# Patient Record
Sex: Female | Born: 1950
Health system: Southern US, Community
[De-identification: ages and names within clinical notes are randomized; demographics above are authoritative.]

## PROBLEM LIST (undated history)

## (undated) DIAGNOSIS — M810 Age-related osteoporosis without current pathological fracture: Secondary | ICD-10-CM

## (undated) DIAGNOSIS — R519 Headache, unspecified: Secondary | ICD-10-CM

## (undated) DIAGNOSIS — F32A Depression, unspecified: Secondary | ICD-10-CM

## (undated) DIAGNOSIS — F419 Anxiety disorder, unspecified: Secondary | ICD-10-CM

## (undated) DIAGNOSIS — F329 Major depressive disorder, single episode, unspecified: Secondary | ICD-10-CM

## (undated) DIAGNOSIS — E785 Hyperlipidemia, unspecified: Secondary | ICD-10-CM

## (undated) DIAGNOSIS — R51 Headache: Secondary | ICD-10-CM

## (undated) DIAGNOSIS — Z8674 Personal history of sudden cardiac arrest: Secondary | ICD-10-CM

## (undated) DIAGNOSIS — T7840XA Allergy, unspecified, initial encounter: Secondary | ICD-10-CM

## (undated) DIAGNOSIS — D649 Anemia, unspecified: Secondary | ICD-10-CM

## (undated) DIAGNOSIS — J189 Pneumonia, unspecified organism: Secondary | ICD-10-CM

## (undated) DIAGNOSIS — K219 Gastro-esophageal reflux disease without esophagitis: Secondary | ICD-10-CM

## (undated) DIAGNOSIS — E039 Hypothyroidism, unspecified: Secondary | ICD-10-CM

## (undated) DIAGNOSIS — M1990 Unspecified osteoarthritis, unspecified site: Secondary | ICD-10-CM

## (undated) DIAGNOSIS — E049 Nontoxic goiter, unspecified: Secondary | ICD-10-CM

## (undated) DIAGNOSIS — Z9289 Personal history of other medical treatment: Secondary | ICD-10-CM

## (undated) HISTORY — DX: Allergy, unspecified, initial encounter: T78.40XA

## (undated) HISTORY — DX: Depression, unspecified: F32.A

## (undated) HISTORY — DX: Age-related osteoporosis without current pathological fracture: M81.0

## (undated) HISTORY — DX: Hyperlipidemia, unspecified: E78.5

## (undated) HISTORY — DX: Major depressive disorder, single episode, unspecified: F32.9

## (undated) HISTORY — PX: DIAGNOSTIC LAPAROSCOPY: SUR761

## (undated) HISTORY — DX: Pneumonia, unspecified organism: J18.9

---

## 1974-09-28 HISTORY — PX: TONSILLECTOMY: SUR1361

## 1977-09-28 HISTORY — PX: ABDOMINAL HYSTERECTOMY: SHX81

## 2005-08-24 ENCOUNTER — Ambulatory Visit: Payer: Self-pay | Admitting: Family Medicine

## 2008-05-08 ENCOUNTER — Ambulatory Visit: Payer: Self-pay | Admitting: Gastroenterology

## 2013-10-11 ENCOUNTER — Emergency Department: Payer: Self-pay | Admitting: Emergency Medicine

## 2015-03-26 ENCOUNTER — Other Ambulatory Visit: Payer: Self-pay | Admitting: Family Medicine

## 2015-03-26 MED ORDER — ROSUVASTATIN CALCIUM 10 MG PO TABS: 10.0000 mg | ORAL_TABLET | Freq: Every day | ORAL | Status: DC

## 2015-05-08 ENCOUNTER — Ambulatory Visit (INDEPENDENT_AMBULATORY_CARE_PROVIDER_SITE_OTHER): Payer: 59 | Admitting: Family Medicine

## 2015-05-08 ENCOUNTER — Encounter: Payer: Self-pay | Admitting: Family Medicine

## 2015-05-08 VITALS — BP 138/80 | HR 72 | Temp 97.9°F | Ht 61.2 in | Wt 126.0 lb

## 2015-05-08 DIAGNOSIS — Z1239 Encounter for other screening for malignant neoplasm of breast: Secondary | ICD-10-CM | POA: Diagnosis not present

## 2015-05-08 DIAGNOSIS — Z0000 Encounter for general adult medical examination without abnormal findings: Secondary | ICD-10-CM | POA: Diagnosis not present

## 2015-05-08 DIAGNOSIS — F329 Major depressive disorder, single episode, unspecified: Secondary | ICD-10-CM

## 2015-05-08 DIAGNOSIS — F32A Depression, unspecified: Secondary | ICD-10-CM

## 2015-05-08 DIAGNOSIS — E785 Hyperlipidemia, unspecified: Secondary | ICD-10-CM | POA: Diagnosis not present

## 2015-05-08 DIAGNOSIS — E039 Hypothyroidism, unspecified: Secondary | ICD-10-CM | POA: Diagnosis not present

## 2015-05-08 LAB — URINALYSIS, ROUTINE W REFLEX MICROSCOPIC
Bilirubin, UA: NEGATIVE
Glucose, UA: NEGATIVE
KETONES UA: NEGATIVE
LEUKOCYTES UA: NEGATIVE
NITRITE UA: NEGATIVE
PH UA: 5.5 (ref 5.0–7.5)
Protein, UA: NEGATIVE
Specific Gravity, UA: 1.02 (ref 1.005–1.030)
UUROB: 0.2 mg/dL (ref 0.2–1.0)

## 2015-05-08 LAB — MICROSCOPIC EXAMINATION

## 2015-05-08 MED ORDER — LEVOTHYROXINE SODIUM 25 MCG PO TABS: 25.0000 ug | ORAL_TABLET | Freq: Every day | ORAL | Status: DC

## 2015-05-08 MED ORDER — ROSUVASTATIN CALCIUM 10 MG PO TABS: 10.0000 mg | ORAL_TABLET | Freq: Every day | ORAL | Status: DC

## 2015-05-08 MED ORDER — DIAZEPAM 5 MG PO TABS: 5.0000 mg | ORAL_TABLET | Freq: Two times a day (BID) | ORAL | Status: DC | PRN

## 2015-05-08 MED ORDER — BUPROPION HCL ER (SR) 150 MG PO TB12: 150.0000 mg | ORAL_TABLET | Freq: Two times a day (BID) | ORAL | Status: DC

## 2015-05-08 MED ORDER — CETIRIZINE HCL 10 MG PO TABS: 10.0000 mg | ORAL_TABLET | Freq: Every day | ORAL | Status: DC

## 2015-05-08 NOTE — Progress Notes (Signed)
BP 138/80 mmHg  Pulse 72  Temp(Src) 97.9 F (36.6 C)  Ht 5' 1.2" (1.554 m)  Wt 126 lb (57.153 kg)  BMI 23.67 kg/m2  SpO2 99%   Subjective:    Patient ID: Annette Miller, female    DOB: 1950/11/26, 64 y.o.   MRN: [id number]  HPI: Annette Miller is a 64 y.o. female  Chief Complaint  Patient presents with  . Annual Exam   patient follow-up for physical doing well on medications which were reviewed depression doing well as his anxiety takes Valium and is been on this same dose for 8 years. Does not increased medications are decreased. No issues with other medications no side effects.  Relevant past medical, surgical, family and social history reviewed and updated as indicated. Interim medical history since our last visit reviewed. Allergies and medications reviewed and updated.  Review of Systems  Constitutional: Negative.   HENT: Negative.   Eyes: Negative.   Respiratory: Negative.        Patient still smoking discussed smoking cessation patient not ready to start killing people. But she does say she is only smoking 4 cigarettes a day.  Cardiovascular: Negative.   Gastrointestinal: Negative.   Endocrine: Negative.   Genitourinary: Negative.   Musculoskeletal: Negative.   Skin: Negative.   Allergic/Immunologic: Negative.   Neurological: Negative.   Hematological: Negative.   Psychiatric/Behavioral: Negative.     Per HPI unless specifically indicated above     Objective:    BP 138/80 mmHg  Pulse 72  Temp(Src) 97.9 F (36.6 C)  Ht 5' 1.2" (1.554 m)  Wt 126 lb (57.153 kg)  BMI 23.67 kg/m2  SpO2 99%  Wt Readings from Last 3 Encounters:  05/08/15 126 lb (57.153 kg)  01/22/15 125 lb (56.7 kg)    Physical Exam  Constitutional: She is oriented to person, place, and time. She appears well-developed and well-nourished.  HENT:  Head: Normocephalic and atraumatic.  Right Ear: External ear normal.  Left Ear: External ear normal.  Nose: Nose normal.  Mouth/Throat:  Oropharynx is clear and moist.  Eyes: Conjunctivae and EOM are normal. Pupils are equal, round, and reactive to light.  Neck: Normal range of motion. Neck supple. Carotid bruit is not present.  Cardiovascular: Normal rate, regular rhythm and normal heart sounds.   No murmur heard. Pulmonary/Chest: Effort normal and breath sounds normal. Right breast exhibits no mass and no tenderness. Left breast exhibits no mass and no tenderness. Breasts are symmetrical.  Abdominal: Soft. Bowel sounds are normal. There is no hepatosplenomegaly.  Musculoskeletal: Normal range of motion.  Neurological: She is alert and oriented to person, place, and time.  Skin: No rash noted.  Psychiatric: She has a normal mood and affect. Her behavior is normal. Judgment and thought content normal.    No results found for this or any previous visit.    Assessment & Plan:   Problem List Items Addressed This Visit      Endocrine   Hypothyroidism    The current medical regimen is effective;  continue present plan and medications.       Relevant Medications   levothyroxine (SYNTHROID, LEVOTHROID) 25 MCG tablet     Other   Hyperlipidemia    The current medical regimen is effective;  continue present plan and medications.       Relevant Medications   rosuvastatin (CRESTOR) 10 MG tablet   Other Relevant Orders   LP+ALT+AST+Glu Piccolo, Waived   Depression    Discussed  with patient treatment and care discussed use of Valium and trying to taper some.      Relevant Medications   buPROPion (WELLBUTRIN SR) 150 MG 12 hr tablet   diazepam (VALIUM) 5 MG tablet    Other Visit Diagnoses    Routine general medical examination at a health care facility    -  Primary    Relevant Orders    CBC with Differential/Platelet    Comprehensive metabolic panel    Lipid Panel w/o Chol/HDL Ratio    TSH    Urinalysis, Routine w reflex microscopic (not at [facility])    Breast cancer screening        Relevant Orders    MM DIGITAL  SCREENING BILATERAL        Follow up plan: Return in about 6 months (around 11/08/2015) for med re check.

## 2015-05-08 NOTE — Assessment & Plan Note (Signed)
The current medical regimen is effective;  continue present plan and medications.  

## 2015-05-08 NOTE — Assessment & Plan Note (Signed)
Discussed with patient treatment and care discussed use of Valium and trying to taper some.

## 2015-05-09 ENCOUNTER — Encounter: Payer: Self-pay | Admitting: Family Medicine

## 2015-05-09 LAB — COMPREHENSIVE METABOLIC PANEL
ALBUMIN: 4.2 g/dL (ref 3.6–4.8)
ALK PHOS: 76 IU/L (ref 39–117)
ALT: 11 IU/L (ref 0–32)
AST: 12 IU/L (ref 0–40)
Albumin/Globulin Ratio: 1.8 (ref 1.1–2.5)
BUN / CREAT RATIO: 12 (ref 11–26)
BUN: 10 mg/dL (ref 8–27)
CHLORIDE: 103 mmol/L (ref 97–108)
CO2: 27 mmol/L (ref 18–29)
Calcium: 9.7 mg/dL (ref 8.7–10.3)
Creatinine, Ser: 0.83 mg/dL (ref 0.57–1.00)
GFR calc non Af Amer: 75 mL/min/{1.73_m2} (ref >59)
GFR, EST AFRICAN AMERICAN: 86 mL/min/{1.73_m2} (ref >59)
Globulin, Total: 2.3 g/dL (ref 1.5–4.5)
Glucose: 83 mg/dL (ref 65–99)
Potassium: 4.9 mmol/L (ref 3.5–5.2)
SODIUM: 142 mmol/L (ref 134–144)
Total Protein: 6.5 g/dL (ref 6.0–8.5)

## 2015-05-09 LAB — CBC WITH DIFFERENTIAL/PLATELET
Basophils Absolute: 0 10*3/uL (ref 0.0–0.2)
Basos: 1 %
EOS (ABSOLUTE): 0.4 10*3/uL (ref 0.0–0.4)
Eos: 7 %
Hematocrit: 36 % (ref 34.0–46.6)
Hemoglobin: 11.9 g/dL (ref 11.1–15.9)
Immature Grans (Abs): 0 10*3/uL (ref 0.0–0.1)
Immature Granulocytes: 0 %
Lymphocytes Absolute: 2.2 10*3/uL (ref 0.7–3.1)
Lymphs: 42 %
MCH: 31.8 pg (ref 26.6–33.0)
MCHC: 33.1 g/dL (ref 31.5–35.7)
MCV: 96 fL (ref 79–97)
MONOS ABS: 0.4 10*3/uL (ref 0.1–0.9)
Monocytes: 7 %
NEUTROS PCT: 43 %
Neutrophils Absolute: 2.3 10*3/uL (ref 1.4–7.0)
PLATELETS: 346 10*3/uL (ref 150–379)
RBC: 3.74 x10E6/uL — ABNORMAL LOW (ref 3.77–5.28)
RDW: 12.9 % (ref 12.3–15.4)
WBC: 5.2 10*3/uL (ref 3.4–10.8)

## 2015-05-09 LAB — LIPID PANEL W/O CHOL/HDL RATIO
CHOLESTEROL TOTAL: 179 mg/dL (ref 100–199)
HDL: 54 mg/dL (ref >39)
LDL Calculated: 92 mg/dL (ref 0–99)
Triglycerides: 166 mg/dL — ABNORMAL HIGH (ref 0–149)
VLDL Cholesterol Cal: 33 mg/dL (ref 5–40)

## 2015-05-09 LAB — TSH: TSH: 0.953 u[IU]/mL (ref 0.450–4.500)

## 2015-05-19 ENCOUNTER — Other Ambulatory Visit: Payer: Self-pay | Admitting: Family Medicine

## 2015-05-20 NOTE — Telephone Encounter (Signed)
rx printed

## 2015-06-04 ENCOUNTER — Other Ambulatory Visit: Payer: Self-pay | Admitting: Family Medicine

## 2015-06-17 ENCOUNTER — Other Ambulatory Visit: Payer: Self-pay | Admitting: Family Medicine

## 2015-11-12 ENCOUNTER — Encounter: Payer: Self-pay | Admitting: Family Medicine

## 2015-11-12 ENCOUNTER — Ambulatory Visit (INDEPENDENT_AMBULATORY_CARE_PROVIDER_SITE_OTHER): Payer: 59 | Admitting: Family Medicine

## 2015-11-12 DIAGNOSIS — E785 Hyperlipidemia, unspecified: Secondary | ICD-10-CM

## 2015-11-12 LAB — LP+ALT+AST+GLU PICCOLO, WAIVED
ALT (SGPT) PICCOLO, WAIVED: 18 U/L (ref 10–47)
AST (SGOT) Piccolo, Waived: 18 U/L (ref 11–38)
CHOL/HDL RATIO PICCOLO,WAIVE: 3.1 mg/dL
Cholesterol Piccolo, Waived: 206 mg/dL — ABNORMAL HIGH (ref <200)
Glucose Piccolo, Waived: 99 mg/dL (ref 73–118)
HDL Chol Piccolo, Waived: 67 mg/dL (ref >59)
LDL CHOL CALC PICCOLO WAIVED: 98 mg/dL (ref <100)
Triglycerides Piccolo,Waived: 205 mg/dL — ABNORMAL HIGH (ref <150)
VLDL Chol Calc Piccolo,Waive: 41 mg/dL — ABNORMAL HIGH (ref <30)

## 2015-11-12 MED ORDER — LEVOTHYROXINE SODIUM 25 MCG PO TABS: 25.0000 ug | ORAL_TABLET | Freq: Every day | ORAL | Status: DC

## 2015-11-12 MED ORDER — DIAZEPAM 5 MG PO TABS: 5.0000 mg | ORAL_TABLET | Freq: Two times a day (BID) | ORAL | Status: DC | PRN

## 2015-11-12 NOTE — Progress Notes (Signed)
BP 110/72 mmHg  Pulse 80  Temp(Src) 97.8 F (36.6 C)  Ht 5' 1.2" (1.554 m)  Wt 133 lb (60.328 kg)  BMI 24.98 kg/m2  SpO2 99%   Subjective:    Patient ID: Annette Miller, female    DOB: 1951-08-09, 65 y.o.   MRN: [id number]  HPI: Annette Miller is a 65 y.o. female  Chief Complaint  Patient presents with  . Hyperlipidemia   patient recheck doing well with Crestor no complaints or problems takes faithfully without side effects Smoking and anxieties down to 3 cigarettes a day and depression nerves are much better anxiety is better controlled with well maturing  Valium twice a day also with good control Thyroid doing well  Relevant past medical, surgical, family and social history reviewed and updated as indicated. Interim medical history since our last visit reviewed. Allergies and medications reviewed and updated. Patient taking both Allegra in the morning and Zyrtec at night has tried stopping 1 and got stopped up promptly and couldn't go without it  Review of Systems  Constitutional: Negative.   Respiratory: Negative.   Cardiovascular: Negative.     Per HPI unless specifically indicated above     Objective:    BP 110/72 mmHg  Pulse 80  Temp(Src) 97.8 F (36.6 C)  Ht 5' 1.2" (1.554 m)  Wt 133 lb (60.328 kg)  BMI 24.98 kg/m2  SpO2 99%  Wt Readings from Last 3 Encounters:  11/12/15 133 lb (60.328 kg)  05/08/15 126 lb (57.153 kg)  01/22/15 125 lb (56.7 kg)    Physical Exam  Constitutional: She is oriented to person, place, and time. She appears well-developed and well-nourished. No distress.  HENT:  Head: Normocephalic and atraumatic.  Right Ear: Hearing normal.  Left Ear: Hearing normal.  Nose: Nose normal.  Eyes: Conjunctivae and lids are normal. Right eye exhibits no discharge. Left eye exhibits no discharge. No scleral icterus.  Cardiovascular: Normal rate, regular rhythm and normal heart sounds.   Pulmonary/Chest: Effort normal and breath sounds  normal. No respiratory distress.  Musculoskeletal: Normal range of motion.  Neurological: She is alert and oriented to person, place, and time.  Skin: Skin is intact. No rash noted.  Psychiatric: She has a normal mood and affect. Her speech is normal and behavior is normal. Judgment and thought content normal. Cognition and memory are normal.    Results for orders placed or performed in visit on 05/08/15  Microscopic Examination  Result Value Ref Range   WBC, UA 0-5 0 -  5 /hpf   RBC, UA 0-2 0 -  2 /hpf   Epithelial Cells (non renal) 0-10 0 - 10 /hpf   Bacteria, UA Few None seen/Few  CBC with Differential/Platelet  Result Value Ref Range   WBC 5.2 3.4 - 10.8 x10E3/uL   RBC 3.74 (L) 3.77 - 5.28 x10E6/uL   Hemoglobin 11.9 11.1 - 15.9 g/dL   Hematocrit 40.9 81.1 - 46.6 %   MCV 96 79 - 97 fL   MCH 31.8 26.6 - 33.0 pg   MCHC 33.1 31.5 - 35.7 g/dL   RDW 91.4 78.2 - 95.6 %   Platelets 346 150 - 379 x10E3/uL   Neutrophils 43 %   Lymphs 42 %   Monocytes 7 %   Eos 7 %   Basos 1 %   Neutrophils Absolute 2.3 1.4 - 7.0 x10E3/uL   Lymphocytes Absolute 2.2 0.7 - 3.1 x10E3/uL   Monocytes Absolute 0.4 0.1 - 0.9 x10E3/uL  EOS (ABSOLUTE) 0.4 0.0 - 0.4 x10E3/uL   Basophils Absolute 0.0 0.0 - 0.2 x10E3/uL   Immature Granulocytes 0 %   Immature Grans (Abs) 0.0 0.0 - 0.1 x10E3/uL  Comprehensive metabolic panel  Result Value Ref Range   Glucose 83 65 - 99 mg/dL   BUN 10 8 - 27 mg/dL   Creatinine, Ser 1.61 0.57 - 1.00 mg/dL   GFR calc non Af Amer 75 >59 mL/min/1.73   GFR calc Af Amer 86 >59 mL/min/1.73   BUN/Creatinine Ratio 12 11 - 26   Sodium 142 134 - 144 mmol/L   Potassium 4.9 3.5 - 5.2 mmol/L   Chloride 103 97 - 108 mmol/L   CO2 27 18 - 29 mmol/L   Calcium 9.7 8.7 - 10.3 mg/dL   Total Protein 6.5 6.0 - 8.5 g/dL   Albumin 4.2 3.6 - 4.8 g/dL   Globulin, Total 2.3 1.5 - 4.5 g/dL   Albumin/Globulin Ratio 1.8 1.1 - 2.5   Bilirubin Total <0.2 0.0 - 1.2 mg/dL   Alkaline Phosphatase 76 39  - 117 IU/L   AST 12 0 - 40 IU/L   ALT 11 0 - 32 IU/L  Lipid Panel w/o Chol/HDL Ratio  Result Value Ref Range   Cholesterol, Total 179 100 - 199 mg/dL   Triglycerides 096 (H) 0 - 149 mg/dL   HDL 54 >04 mg/dL   VLDL Cholesterol Cal 33 5 - 40 mg/dL   LDL Calculated 92 0 - 99 mg/dL  TSH  Result Value Ref Range   TSH 0.953 0.450 - 4.500 uIU/mL  Urinalysis, Routine w reflex microscopic (not at [facility]  Result Value Ref Range   Specific Gravity, UA 1.020 1.005 - 1.030   pH, UA 5.5 5.0 - 7.5   Color, UA Yellow Yellow   Appearance Ur Clear Clear   Leukocytes, UA Negative Negative   Protein, UA Negative Negative/Trace   Glucose, UA Negative Negative   Ketones, UA Negative Negative   RBC, UA Trace (A) Negative   Bilirubin, UA Negative Negative   Urobilinogen, Ur 0.2 0.2 - 1.0 mg/dL   Nitrite, UA Negative Negative   Microscopic Examination See below:       Assessment & Plan:   Problem List Items Addressed This Visit      Other   Hyperlipidemia       Follow up plan: Return in about 6 months (around 05/11/2016) for Physical Exam.

## 2015-12-11 ENCOUNTER — Ambulatory Visit (INDEPENDENT_AMBULATORY_CARE_PROVIDER_SITE_OTHER): Payer: 59 | Admitting: Family Medicine

## 2015-12-11 ENCOUNTER — Encounter: Payer: Self-pay | Admitting: Family Medicine

## 2015-12-11 VITALS — BP 127/78 | HR 92 | Temp 97.8°F | Ht 61.1 in | Wt 132.4 lb

## 2015-12-11 DIAGNOSIS — J019 Acute sinusitis, unspecified: Secondary | ICD-10-CM | POA: Diagnosis not present

## 2015-12-11 MED ORDER — BENZONATATE 100 MG PO CAPS: 100.0000 mg | ORAL_CAPSULE | Freq: Two times a day (BID) | ORAL | Status: DC | PRN

## 2015-12-11 MED ORDER — AMOXICILLIN-POT CLAVULANATE 875-125 MG PO TABS: 1.0000 | ORAL_TABLET | Freq: Two times a day (BID) | ORAL | Status: DC

## 2015-12-11 NOTE — Progress Notes (Signed)
BP 127/78 mmHg  Pulse 92  Temp(Src) 97.8 F (36.6 C)  Ht 5' 1.1" (1.552 m)  Wt 132 lb 6.4 oz (60.056 kg)  BMI 24.93 kg/m2  SpO2 99%   Subjective:    Patient ID: Annette Miller, female    DOB: 05/04/1951, 65 y.o.   MRN: [id number]  HPI: Annette Miller is a 66 y.o. female  Chief Complaint  Patient presents with  . URI    pt states she has a headache, stuffy nose, sore thorat, ear ache, and chest congestion. States symtpoms started Sunday night.    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 Interim medical history since our last visit reviewed. Allergies and medications reviewed and updated.  Review of Systems  Constitutional: Positive for fever, chills, diaphoresis and fatigue.  HENT: Positive for congestion, postnasal drip, rhinorrhea, sinus pressure, sneezing and sore throat. Negative for ear pain.   Respiratory: Positive for cough and shortness of breath.   Cardiovascular: Negative.     Per HPI unless specifically indicated above     Objective:    BP 127/78 mmHg  Pulse 92  Temp(Src) 97.8 F (36.6 C)  Ht 5' 1.1" (1.552 m)  Wt 132 lb 6.4 oz (60.056 kg)  BMI 24.93 kg/m2  SpO2 99%  Wt Readings from Last 3 Encounters:  12/11/15 132 lb 6.4 oz (60.056 kg)  11/12/15 133 lb (60.328 kg)  05/08/15 126 lb (57.153 kg)    Physical Exam  Constitutional: She is oriented to person, place, and time. She appears well-developed and well-nourished. No distress.  HENT:  Head: Normocephalic and atraumatic.  Right Ear: Hearing and external ear normal.   Left Ear: Hearing and external ear normal.  Nose: Nose normal.  Mouth/Throat: Oropharyngeal exudate present.  Eyes: Conjunctivae and lids are normal. Right eye exhibits no discharge. Left eye exhibits no discharge. No scleral icterus.  Cardiovascular: Normal rate, regular rhythm and normal heart sounds.   Pulmonary/Chest: Effort normal and breath sounds normal. No respiratory distress.  Musculoskeletal: Normal range of motion.  Neurological: She is alert and oriented to person, place, and time.  Skin: Skin is intact. No rash noted.  Psychiatric: She has a normal mood and affect. Her speech is normal and behavior is normal. Judgment and thought content normal. Cognition and memory are normal.    Results for orders placed or performed in visit on 11/12/15  LP+ALT+AST+Glu Piccolo, [employer]  Result Value Ref Range   ALT (SGPT) Piccolo, Waived 18 10 - 47 U/L   AST (SGOT) Piccolo, Waived 18 11 - 38 U/L   Glucose Piccolo, Waived 99 73 - 118 mg/dL   Cholesterol Piccolo, Waived 206 (H) <200 mg/dL   HDL Chol Piccolo, Waived 67 >59 mg/dL   Triglycerides Piccolo,Waived 205 (H) <150 mg/dL   Chol/HDL Ratio Piccolo,Waive 3.1 mg/dL   LDL Chol Calc Piccolo Waived 98 <100 mg/dL   VLDL Chol Calc Piccolo,Waive 41 (H) <30 mg/dL      Assessment & Plan:   Problem List Items Addressed This Visit    None    Visit Diagnoses  Acute sinusitis, recurrence not specified, unspecified location    -  Primary    Discussed sinusitis care and treatment use of antibiotics use of over-the-counter medications Tylenol etc. infection control recheck if worse, nasal rinse    Relevant Medications    amoxicillin-clavulanate (AUGMENTIN) 875-125 MG tablet    benzonatate (TESSALON) 100 MG capsule        Follow up plan: Return if symptoms worsen or fail to improve, for As scheduled.

## 2015-12-11 NOTE — Addendum Note (Signed)
Addended byVonita Moss: Raenell Mensing on: 12/11/2015 11:08 AM   Modules accepted: Orders, SmartSet

## 2016-03-04 ENCOUNTER — Other Ambulatory Visit: Payer: Self-pay | Admitting: Family Medicine

## 2016-03-04 NOTE — Telephone Encounter (Signed)
fax

## 2016-05-07 ENCOUNTER — Other Ambulatory Visit: Payer: Self-pay | Admitting: Family Medicine

## 2016-05-12 ENCOUNTER — Encounter: Payer: Self-pay | Admitting: Family Medicine

## 2016-05-12 ENCOUNTER — Ambulatory Visit (INDEPENDENT_AMBULATORY_CARE_PROVIDER_SITE_OTHER): Payer: 59 | Admitting: Family Medicine

## 2016-05-12 VITALS — BP 127/78 | HR 82 | Temp 98.2°F | Ht 61.5 in | Wt 131.0 lb

## 2016-05-12 DIAGNOSIS — Z13820 Encounter for screening for osteoporosis: Secondary | ICD-10-CM

## 2016-05-12 DIAGNOSIS — E039 Hypothyroidism, unspecified: Secondary | ICD-10-CM

## 2016-05-12 DIAGNOSIS — Z90710 Acquired absence of both cervix and uterus: Secondary | ICD-10-CM

## 2016-05-12 DIAGNOSIS — Z23 Encounter for immunization: Secondary | ICD-10-CM | POA: Diagnosis not present

## 2016-05-12 DIAGNOSIS — F32A Depression, unspecified: Secondary | ICD-10-CM

## 2016-05-12 DIAGNOSIS — E785 Hyperlipidemia, unspecified: Secondary | ICD-10-CM

## 2016-05-12 DIAGNOSIS — Z0000 Encounter for general adult medical examination without abnormal findings: Secondary | ICD-10-CM | POA: Diagnosis not present

## 2016-05-12 DIAGNOSIS — F329 Major depressive disorder, single episode, unspecified: Secondary | ICD-10-CM | POA: Diagnosis not present

## 2016-05-12 DIAGNOSIS — Z1231 Encounter for screening mammogram for malignant neoplasm of breast: Secondary | ICD-10-CM

## 2016-05-12 LAB — URINALYSIS, ROUTINE W REFLEX MICROSCOPIC
Bilirubin, UA: NEGATIVE
GLUCOSE, UA: NEGATIVE
Ketones, UA: NEGATIVE
Leukocytes, UA: NEGATIVE
NITRITE UA: NEGATIVE
PH UA: 5 (ref 5.0–7.5)
Protein, UA: NEGATIVE
Specific Gravity, UA: 1.025 (ref 1.005–1.030)
Urobilinogen, Ur: 0.2 mg/dL (ref 0.2–1.0)

## 2016-05-12 LAB — MICROSCOPIC EXAMINATION

## 2016-05-12 MED ORDER — LEVOTHYROXINE SODIUM 25 MCG PO TABS: 25.0000 ug | ORAL_TABLET | Freq: Every day | ORAL | 4 refills | Status: DC

## 2016-05-12 MED ORDER — FEXOFENADINE HCL 180 MG PO TABS: 180.0000 mg | ORAL_TABLET | Freq: Every day | ORAL | 4 refills | Status: DC

## 2016-05-12 MED ORDER — ROSUVASTATIN CALCIUM 10 MG PO TABS: 10.0000 mg | ORAL_TABLET | Freq: Every day | ORAL | 4 refills | Status: DC

## 2016-05-12 MED ORDER — BUPROPION HCL ER (SR) 150 MG PO TB12: 150.0000 mg | ORAL_TABLET | Freq: Two times a day (BID) | ORAL | 4 refills | Status: DC

## 2016-05-12 MED ORDER — DIAZEPAM 5 MG PO TABS: 5.0000 mg | ORAL_TABLET | Freq: Two times a day (BID) | ORAL | 5 refills | Status: DC | PRN

## 2016-05-12 NOTE — Assessment & Plan Note (Signed)
The current medical regimen is effective;  continue present plan and medications.  

## 2016-05-12 NOTE — Patient Instructions (Signed)
Pneumococcal Conjugate Vaccine (PCV13)   1. Why get vaccinated?  Vaccination can protect both children and adults from pneumococcal disease.  Pneumococcal disease is caused by bacteria that can spread from person to person through close contact. It can cause ear infections, and it can also lead to more serious infections of the:  · Lungs (pneumonia),  · Blood (bacteremia), and  · Covering of the brain and spinal cord (meningitis).  Pneumococcal pneumonia is most common among adults. Pneumococcal meningitis can cause deafness and brain damage, and it kills about 1 child in 10 who get it.  Anyone can get pneumococcal disease, but children under 2 years of age and adults 65 years and older, people with certain medical conditions, and cigarette smokers are at the highest risk.  Before there was a vaccine, the [place] saw:  · more than 700 cases of meningitis,  · about 13,000 blood infections,  · about 5 million ear infections, and  · about 200 deaths  in children under 5 each year from pneumococcal disease. Since vaccine became available, severe pneumococcal disease in these children has fallen by 88%.  About 18,000 older adults die of pneumococcal disease each year in the [place].  Treatment of pneumococcal infections with penicillin and other drugs is not as effective as it used to be, because some strains of the disease have become resistant to these drugs. This makes prevention of the disease, through vaccination, even more important.  2. PCV13 vaccine  Pneumococcal conjugate vaccine (called PCV13) protects against 13 types of pneumococcal bacteria.  PCV13 is routinely given to children at 2, 4, 6, and 12-15 months of age. It is also recommended for children and adults 2 to 64 years of age with certain health conditions, and for all adults 65 years of age and older. Your doctor can give you details.  3. Some people should not get this vaccine  Anyone who has ever had a life-threatening allergic reaction  to a dose of this vaccine, to an earlier pneumococcal vaccine called PCV7, or to any vaccine containing diphtheria toxoid (for example, DTaP), should not get PCV13.  Anyone with a severe allergy to any component of PCV13 should not get the vaccine. Tell your doctor if the person being vaccinated has any severe allergies.  If the person scheduled for vaccination is not feeling well, your healthcare provider might decide to reschedule the shot on another day.  4. Risks of a vaccine reaction  With any medicine, including vaccines, there is a chance of reactions. These are usually mild and go away on their own, but serious reactions are also possible.  Problems reported following PCV13 varied by age and dose in the series. The most common problems reported among children were:  · About half became drowsy after the shot, had a temporary loss of appetite, or had redness or tenderness where the shot was given.  · About 1 out of 3 had swelling where the shot was given.  · About 1 out of 3 had a mild fever, and about 1 in 20 had a fever over 102.2°F.  · Up to about 8 out of 10 became fussy or irritable.  Adults have reported pain, redness, and swelling where the shot was given; also mild fever, fatigue, headache, chills, or muscle pain.  Young children who get PCV13 along with inactivated flu vaccine at the same time may be at increased risk for seizures caused by fever. Ask your doctor for more information.  Problems that   could happen after any vaccine:  · People sometimes faint after a medical procedure, including vaccination. Sitting or lying down for about 15 minutes can help prevent fainting, and injuries caused by a fall. Tell your doctor if you feel dizzy, or have vision changes or ringing in the ears.  · Some older children and adults get severe pain in the shoulder and have difficulty moving the arm where a shot was given. This happens very rarely.  · Any medication can cause a severe allergic reaction. Such  reactions from a vaccine are very rare, estimated at about 1 in a million doses, and would happen within a few minutes to a few hours after the vaccination.  As with any medicine, there is a very small chance of a vaccine causing a serious injury or death.  The safety of vaccines is always being monitored. For more information, visit: www.cdc.gov/vaccinesafety/  5. What if there is a serious reaction?  What should I look for?  · Look for anything that concerns you, such as signs of a severe allergic reaction, very high fever, or unusual behavior.  Signs of a severe allergic reaction can include hives, swelling of the face and throat, difficulty breathing, a fast heartbeat, dizziness, and weakness-usually within a few minutes to a few hours after the vaccination.  What should I do?  · If you think it is a severe allergic reaction or other emergency that can't wait, call 9-1-1 or get the person to the nearest hospital. Otherwise, call your doctor.  Reactions should be reported to the Vaccine Adverse Event Reporting System (VAERS). Your doctor should file this report, or you can do it yourself through the VAERS web site at www.vaers.hhs.gov, or by calling 1-800-822-7967.  VAERS does not give medical advice.  6. The National Vaccine Injury Compensation Program  The National Vaccine Injury Compensation Program (VICP) is a federal program that was created to compensate people who may have been injured by certain vaccines.  Persons who believe they may have been injured by a vaccine can learn about the program and about filing a claim by calling 1-800-338-2382 or visiting the VICP website at www.hrsa.gov/vaccinecompensation. There is a time limit to file a claim for compensation.  7. How can I learn more?  · Ask your healthcare provider. He or she can give you the vaccine package insert or suggest other sources of information.  · Call your local or state health department.  · Contact the Centers for Disease Control and  Prevention (CDC):    Call 1-800-232-4636 (1-800-CDC-INFO) or    Visit CDC's website at www.cdc.gov/vaccines  Vaccine Information Statement  PCV13 Vaccine (08/02/2014)     This information is not intended to replace advice given to you by your health care provider. Make sure you discuss any questions you have with your health care provider.     Document Released: 07/12/2006 Document Revised: 10/05/2014 Document Reviewed: 08/09/2014  [employer]vier Interactive Patient Education ©2016 [employer]

## 2016-05-12 NOTE — Progress Notes (Signed)
BP 127/78 (BP Location: Left Arm, Patient Position: Sitting, Cuff Size: Small)   Pulse 82   Temp 98.2 F (36.8 C)   Ht 5' 1.5" (1.562 m)   Wt 131 lb (59.4 kg)   SpO2 95%   BMI 24.35 kg/m    Subjective:    Patient ID: Annette Miller, female    DOB: 1950-11-29, 65 y.o.   MRN: [id number]  HPI: Annette Miller is a 65 y.o. female  Annual exam Patient very stressful with 2 deaths in her family this month has been taking Valium 5 mg twice a day most every day without problems increasing use and is been stable. His been able to work without problems Wellbutrin doing okay especially helping her not smoking especially during these high stress periods Taking thyroid without problems Taking Crestor without problems Has a Z-Pak for sinus symptoms but still just not helping has been on it for 4 days  Relevant past medical, surgical, family and social history reviewed and updated as indicated. Interim medical history since our last visit reviewed. Allergies and medications reviewed and updated.  Review of Systems  Constitutional: Negative.   HENT: Negative.   Eyes: Negative.   Respiratory: Negative.   Cardiovascular: Negative.   Gastrointestinal: Negative.   Endocrine: Negative.   Genitourinary: Negative.   Musculoskeletal: Negative.   Skin: Negative.   Allergic/Immunologic: Negative.   Neurological: Negative.   Hematological: Negative.   Psychiatric/Behavioral: Negative.     Per HPI unless specifically indicated above     Objective:    BP 127/78 (BP Location: Left Arm, Patient Position: Sitting, Cuff Size: Small)   Pulse 82   Temp 98.2 F (36.8 C)   Ht 5' 1.5" (1.562 m)   Wt 131 lb (59.4 kg)   SpO2 95%   BMI 24.35 kg/m   Wt Readings from Last 3 Encounters:  05/12/16 131 lb (59.4 kg)  12/11/15 132 lb 6.4 oz (60.1 kg)  11/12/15 133 lb (60.3 kg)    Physical Exam  Constitutional: She is oriented to person, place, and time. She appears well-developed and  well-nourished.  HENT:  Head: Normocephalic and atraumatic.  Right Ear: External ear normal.  Left Ear: External ear normal.  Nose: Nose normal.  Mouth/Throat: Oropharynx is clear and moist.  Eyes: Conjunctivae and EOM are normal. Pupils are equal, round, and reactive to light.  Neck: Normal range of motion. Neck supple. Carotid bruit is not present.  Cardiovascular: Normal rate, regular rhythm and normal heart sounds.   No murmur heard. Pulmonary/Chest: Effort normal and breath sounds normal. She exhibits no mass. Right breast exhibits no mass, no skin change and no tenderness. Left breast exhibits no mass, no skin change and no tenderness. Breasts are symmetrical.  Abdominal: Soft. Bowel sounds are normal. There is no hepatosplenomegaly.  Musculoskeletal: Normal range of motion.  Neurological: She is alert and oriented to person, place, and time.  Skin: No rash noted.  Psychiatric: She has a normal mood and affect. Her behavior is normal. Judgment and thought content normal.    Results for orders placed or performed in visit on 11/12/15  LP+ALT+AST+Glu Piccolo, Arrow ElectronicsWaived  Result Value Ref Range   ALT (SGPT) Piccolo, Waived 18 10 - 47 U/L   AST (SGOT) Piccolo, Waived 18 11 - 38 U/L   Glucose Piccolo, Waived 99 73 - 118 mg/dL   Cholesterol Piccolo, Waived 206 (H) <200 mg/dL   HDL Chol Piccolo, Waived 67 >59 mg/dL   Triglycerides Piccolo,Waived 205 (  H) <150 mg/dL   Chol/HDL Ratio Piccolo,Waive 3.1 mg/dL   LDL Chol Calc Piccolo Waived 98 <100 mg/dL   VLDL Chol Calc Piccolo,Waive 41 (H) <30 mg/dL      Assessment & Plan:   Problem List Items Addressed This Visit      Endocrine   Hypothyroidism    The current medical regimen is effective;  continue present plan and medications.       Relevant Medications   levothyroxine (SYNTHROID, LEVOTHROID) 25 MCG tablet     Other   Hyperlipidemia    The current medical regimen is effective;  continue present plan and medications.        Relevant Medications   rosuvastatin (CRESTOR) 10 MG tablet   Depression    Pression stable with ongoing anxiety will continue Valium      Relevant Medications   diazepam (VALIUM) 5 MG tablet   buPROPion (WELLBUTRIN SR) 150 MG 12 hr tablet   H/O total hysterectomy    Other Visit Diagnoses    Well adult exam    -  Primary   Relevant Orders   CBC with Differential/Platelet   Comprehensive metabolic panel   Lipid Panel w/o Chol/HDL Ratio   TSH   Urinalysis, Routine w reflex microscopic ([facility])   Encounter for screening for osteoporosis       Relevant Orders   DG Bone Density   Encounter for screening mammogram for breast cancer       Relevant Orders   MM Digital Screening   Need for pneumococcal vaccination       Relevant Orders   Pneumococcal conjugate vaccine 13-valent IM (Completed)       Follow up plan: Return in about 6 months (around 11/12/2016) for med check, BMP,  Lipids, ALT, AST.

## 2016-05-12 NOTE — Assessment & Plan Note (Signed)
Pression stable with ongoing anxiety will continue Valium

## 2016-05-13 LAB — COMPREHENSIVE METABOLIC PANEL
ALK PHOS: 102 IU/L (ref 39–117)
ALT: 10 IU/L (ref 0–32)
AST: 12 IU/L (ref 0–40)
Albumin/Globulin Ratio: 1.4 (ref 1.2–2.2)
Albumin: 4.2 g/dL (ref 3.6–4.8)
BUN/Creatinine Ratio: 20 (ref 12–28)
BUN: 16 mg/dL (ref 8–27)
Bilirubin Total: <0.2 mg/dL (ref 0.0–1.2)
CALCIUM: 10.5 mg/dL — AB (ref 8.7–10.3)
CO2: 25 mmol/L (ref 18–29)
CREATININE: 0.79 mg/dL (ref 0.57–1.00)
Chloride: 103 mmol/L (ref 96–106)
GFR calc Af Amer: 91 mL/min/{1.73_m2} (ref >59)
GFR, EST NON AFRICAN AMERICAN: 79 mL/min/{1.73_m2} (ref >59)
Globulin, Total: 2.9 g/dL (ref 1.5–4.5)
Glucose: 91 mg/dL (ref 65–99)
Potassium: 4.6 mmol/L (ref 3.5–5.2)
Sodium: 142 mmol/L (ref 134–144)
Total Protein: 7.1 g/dL (ref 6.0–8.5)

## 2016-05-13 LAB — LIPID PANEL W/O CHOL/HDL RATIO
CHOLESTEROL TOTAL: 285 mg/dL — AB (ref 100–199)
HDL: 51 mg/dL (ref >39)
LDL CALC: 197 mg/dL — AB (ref 0–99)
TRIGLYCERIDES: 184 mg/dL — AB (ref 0–149)
VLDL CHOLESTEROL CAL: 37 mg/dL (ref 5–40)

## 2016-05-13 LAB — CBC WITH DIFFERENTIAL/PLATELET
Basophils Absolute: 0 10*3/uL (ref 0.0–0.2)
Basos: 1 %
EOS (ABSOLUTE): 0.4 10*3/uL (ref 0.0–0.4)
Eos: 7 %
Hematocrit: 35.7 % (ref 34.0–46.6)
Hemoglobin: 11.9 g/dL (ref 11.1–15.9)
IMMATURE GRANULOCYTES: 0 %
Immature Grans (Abs): 0 10*3/uL (ref 0.0–0.1)
Lymphocytes Absolute: 2 10*3/uL (ref 0.7–3.1)
Lymphs: 38 %
MCH: 32.5 pg (ref 26.6–33.0)
MCHC: 33.3 g/dL (ref 31.5–35.7)
MCV: 98 fL — AB (ref 79–97)
MONOS ABS: 0.4 10*3/uL (ref 0.1–0.9)
Monocytes: 8 %
NEUTROS PCT: 46 %
Neutrophils Absolute: 2.5 10*3/uL (ref 1.4–7.0)
PLATELETS: 359 10*3/uL (ref 150–379)
RBC: 3.66 x10E6/uL — AB (ref 3.77–5.28)
RDW: 13 % (ref 12.3–15.4)
WBC: 5.4 10*3/uL (ref 3.4–10.8)

## 2016-05-13 LAB — TSH: TSH: 1.01 u[IU]/mL (ref 0.450–4.500)

## 2016-05-14 ENCOUNTER — Telehealth: Payer: Self-pay | Admitting: Family Medicine

## 2016-05-14 NOTE — Telephone Encounter (Signed)
Phone call Discussed with patient markedly elevated cholesterol patient's been eating a lot of junk food lately reviewed diet exercise nutrition patient will do better recheck lipids this winter.

## 2016-06-02 ENCOUNTER — Other Ambulatory Visit: Payer: Self-pay | Admitting: Family Medicine

## 2016-06-04 ENCOUNTER — Telehealth: Payer: Self-pay | Admitting: Family Medicine

## 2016-06-04 MED ORDER — AZITHROMYCIN 250 MG PO TABS: ORAL_TABLET | ORAL | 0 refills | Status: DC

## 2016-06-04 MED ORDER — BENZONATATE 100 MG PO CAPS: 100.0000 mg | ORAL_CAPSULE | Freq: Two times a day (BID) | ORAL | 0 refills | Status: DC | PRN

## 2016-06-04 NOTE — Telephone Encounter (Signed)
Call pt 

## 2016-06-04 NOTE — Telephone Encounter (Signed)
Pt called stated she completed the Z-pac Dr. Dossie Arbourrissman prescribed but now has an upper respiratory infection and would like to know if something can be called in for her as well as something for a cough. [facility] [place]. Thanks.

## 2016-06-04 NOTE — Telephone Encounter (Signed)
Phone call Patient got better with Z-Pak but got infected again wants another round with some Jerilynn Somessalon Perles will call again.

## 2016-06-19 ENCOUNTER — Other Ambulatory Visit: Payer: Self-pay | Admitting: Family Medicine

## 2016-11-01 ENCOUNTER — Other Ambulatory Visit: Payer: Self-pay | Admitting: Family Medicine

## 2016-11-01 DIAGNOSIS — F329 Major depressive disorder, single episode, unspecified: Secondary | ICD-10-CM

## 2016-11-01 DIAGNOSIS — F32A Depression, unspecified: Secondary | ICD-10-CM

## 2016-11-02 NOTE — Telephone Encounter (Signed)
  Last routine OV: 05/12/16 Next OV: 11/12/16

## 2016-11-05 ENCOUNTER — Other Ambulatory Visit: Payer: Self-pay | Admitting: Family Medicine

## 2016-11-05 DIAGNOSIS — F329 Major depressive disorder, single episode, unspecified: Secondary | ICD-10-CM

## 2016-11-05 DIAGNOSIS — F32A Depression, unspecified: Secondary | ICD-10-CM

## 2016-11-06 ENCOUNTER — Other Ambulatory Visit: Payer: Self-pay | Admitting: Family Medicine

## 2016-11-06 DIAGNOSIS — F32A Depression, unspecified: Secondary | ICD-10-CM

## 2016-11-06 DIAGNOSIS — F329 Major depressive disorder, single episode, unspecified: Secondary | ICD-10-CM

## 2016-11-12 ENCOUNTER — Encounter: Payer: Self-pay | Admitting: Family Medicine

## 2016-11-12 ENCOUNTER — Ambulatory Visit (INDEPENDENT_AMBULATORY_CARE_PROVIDER_SITE_OTHER): Payer: 59 | Admitting: Family Medicine

## 2016-11-12 VITALS — BP 130/82 | HR 97 | Temp 98.6°F | Ht 61.75 in | Wt 135.0 lb

## 2016-11-12 DIAGNOSIS — J019 Acute sinusitis, unspecified: Secondary | ICD-10-CM

## 2016-11-12 DIAGNOSIS — F32A Depression, unspecified: Secondary | ICD-10-CM

## 2016-11-12 DIAGNOSIS — Z79899 Other long term (current) drug therapy: Secondary | ICD-10-CM | POA: Diagnosis not present

## 2016-11-12 DIAGNOSIS — F329 Major depressive disorder, single episode, unspecified: Secondary | ICD-10-CM

## 2016-11-12 DIAGNOSIS — E785 Hyperlipidemia, unspecified: Secondary | ICD-10-CM

## 2016-11-12 LAB — LP+ALT+AST PICCOLO, WAIVED
ALT (SGPT) PICCOLO, WAIVED: 15 U/L (ref 10–47)
AST (SGOT) PICCOLO, WAIVED: 19 U/L (ref 11–38)
CHOL/HDL RATIO PICCOLO,WAIVE: 2.9 mg/dL
Cholesterol Piccolo, Waived: 173 mg/dL (ref <200)
HDL Chol Piccolo, Waived: 59 mg/dL (ref >59)
LDL Chol Calc Piccolo Waived: 72 mg/dL (ref <100)
Triglycerides Piccolo,Waived: 210 mg/dL — ABNORMAL HIGH (ref <150)
VLDL Chol Calc Piccolo,Waive: 42 mg/dL — ABNORMAL HIGH (ref <30)

## 2016-11-12 MED ORDER — DIAZEPAM 5 MG PO TABS: 5.0000 mg | ORAL_TABLET | Freq: Two times a day (BID) | ORAL | 5 refills | Status: DC | PRN

## 2016-11-12 MED ORDER — BENZONATATE 100 MG PO CAPS: 100.0000 mg | ORAL_CAPSULE | Freq: Two times a day (BID) | ORAL | 0 refills | Status: DC | PRN

## 2016-11-12 MED ORDER — AMOXICILLIN-POT CLAVULANATE 875-125 MG PO TABS: 1.0000 | ORAL_TABLET | Freq: Two times a day (BID) | ORAL | 0 refills | Status: DC

## 2016-11-12 NOTE — Progress Notes (Signed)
BP 130/82   Pulse 97   Temp 98.6 F (37 C) (Oral)   Ht 5' 1.75" (1.568 m)   Wt 135 lb (61.2 kg)   SpO2 99%   BMI 24.89 kg/m    Subjective:    Patient ID: Annette Miller, female    DOB: 1951-01-26, 66 y.o.   MRN: [id number]  HPI: Annette Miller is a 66 y.o. female  Chief Complaint  Patient presents with  . Follow-up   Patient follow-up cholesterol doing well taking medications without problems. Patient also is being treated for the flu empirically on last day of Tamiflu originally was getting a little better and not worse with marked sinus pressure congestion drainage feeling bad. Has been taking some over-the-counter medications with minimal effect Tessalon Perles didn't help. Patient also with chronic anxiety is been taking Valium 5 mg twice a day long-term stride intermittent tapering with no relief. Relevant past medical, surgical, family and social history reviewed and updated as indicated. Interim medical history since our last visit reviewed. Allergies and medications reviewed and updated.  Review of Systems  Constitutional: Positive for diaphoresis and fatigue. Negative for fever.  HENT: Positive for congestion, postnasal drip, rhinorrhea, sinus pain and sinus pressure.   Respiratory: Positive for cough.   Cardiovascular: Negative.     Per HPI unless specifically indicated above     Objective:    BP 130/82   Pulse 97   Temp 98.6 F (37 C) (Oral)   Ht 5' 1.75" (1.568 m)   Wt 135 lb (61.2 kg)   SpO2 99%   BMI 24.89 kg/m   Wt Readings from Last 3 Encounters:  11/12/16 135 lb (61.2 kg)  05/12/16 131 lb (59.4 kg)  12/11/15 132 lb 6.4 oz (60.1 kg)    Physical Exam  Constitutional: She is oriented to person, place, and time. She appears well-developed and well-nourished. No distress.  HENT:  Head: Normocephalic and atraumatic.  Right Ear: Hearing and external ear normal.  Left Ear: Hearing and external ear normal.  Nose: Nose normal.  Mouth/Throat:  Oropharyngeal exudate present.  Eyes: Conjunctivae and lids are normal. Right eye exhibits no discharge. Left eye exhibits no discharge. No scleral icterus.  Cardiovascular: Normal rate, regular rhythm and normal heart sounds.   Pulmonary/Chest: Effort normal and breath sounds normal. No respiratory distress.  Musculoskeletal: Normal range of motion.  Neurological: She is alert and oriented to person, place, and time.  Skin: Skin is intact. No rash noted.  Psychiatric: She has a normal mood and affect. Her speech is normal and behavior is normal. Judgment and thought content normal. Cognition and memory are normal.    Results for orders placed or performed in visit on 05/12/16  Microscopic Examination  Result Value Ref Range   WBC, UA 0-5 0 - 5 /hpf   RBC, UA 0-2 0 - 2 /hpf   Epithelial Cells (non renal) 0-10 0 - 10 /hpf   Mucus, UA Present Not Estab.   Bacteria, UA Few None seen/Few  CBC with Differential/Platelet  Result Value Ref Range   WBC 5.4 3.4 - 10.8 x10E3/uL   RBC 3.66 (L) 3.77 - 5.28 x10E6/uL   Hemoglobin 11.9 11.1 - 15.9 g/dL   Hematocrit 40.9 81.1 - 46.6 %   MCV 98 (H) 79 - 97 fL   MCH 32.5 26.6 - 33.0 pg   MCHC 33.3 31.5 - 35.7 g/dL   RDW 91.4 78.2 - 95.6 %   Platelets 359 150 -  379 x10E3/uL   Neutrophils 46 %   Lymphs 38 %   Monocytes 8 %   Eos 7 %   Basos 1 %   Neutrophils Absolute 2.5 1.4 - 7.0 x10E3/uL   Lymphocytes Absolute 2.0 0.7 - 3.1 x10E3/uL   Monocytes Absolute 0.4 0.1 - 0.9 x10E3/uL   EOS (ABSOLUTE) 0.4 0.0 - 0.4 x10E3/uL   Basophils Absolute 0.0 0.0 - 0.2 x10E3/uL   Immature Granulocytes 0 %   Immature Grans (Abs) 0.0 0.0 - 0.1 x10E3/uL  Comprehensive metabolic panel  Result Value Ref Range   Glucose 91 65 - 99 mg/dL   BUN 16 8 - 27 mg/dL   Creatinine, Ser 1.61 0.57 - 1.00 mg/dL   GFR calc non Af Amer 79 >59 mL/min/1.73   GFR calc Af Amer 91 >59 mL/min/1.73   BUN/Creatinine Ratio 20 12 - 28   Sodium 142 134 - 144 mmol/L   Potassium 4.6 3.5  - 5.2 mmol/L   Chloride 103 96 - 106 mmol/L   CO2 25 18 - 29 mmol/L   Calcium 10.5 (H) 8.7 - 10.3 mg/dL   Total Protein 7.1 6.0 - 8.5 g/dL   Albumin 4.2 3.6 - 4.8 g/dL   Globulin, Total 2.9 1.5 - 4.5 g/dL   Albumin/Globulin Ratio 1.4 1.2 - 2.2   Bilirubin Total <0.2 0.0 - 1.2 mg/dL   Alkaline Phosphatase 102 39 - 117 IU/L   AST 12 0 - 40 IU/L   ALT 10 0 - 32 IU/L  Lipid Panel w/o Chol/HDL Ratio  Result Value Ref Range   Cholesterol, Total 285 (H) 100 - 199 mg/dL   Triglycerides 096 (H) 0 - 149 mg/dL   HDL 51 >04 mg/dL   VLDL Cholesterol Cal 37 5 - 40 mg/dL   LDL Calculated 540 (H) 0 - 99 mg/dL   Comment: Comment   TSH  Result Value Ref Range   TSH 1.010 0.450 - 4.500 uIU/mL  Urinalysis, Routine w reflex microscopic (not at [facility])  Result Value Ref Range   Specific Gravity, UA 1.025 1.005 - 1.030   pH, UA 5.0 5.0 - 7.5   Color, UA Yellow Yellow   Appearance Ur Clear Clear   Leukocytes, UA Negative Negative   Protein, UA Negative Negative/Trace   Glucose, UA Negative Negative   Ketones, UA Negative Negative   RBC, UA Trace (A) Negative   Bilirubin, UA Negative Negative   Urobilinogen, Ur 0.2 0.2 - 1.0 mg/dL   Nitrite, UA Negative Negative   Microscopic Examination See below:       Assessment & Plan:   Problem List Items Addressed This Visit      Other   Hyperlipidemia - Primary    The current medical regimen is effective;  continue present plan and medications.       Relevant Orders   Basic metabolic panel   LP+ALT+AST Piccolo, Waived   Depression    The current medical regimen is effective;  continue present plan and medications.       Relevant Medications   diazepam (VALIUM) 5 MG tablet    Other Visit Diagnoses    Medication management       Relevant Orders   Basic metabolic panel   LP+ALT+AST Piccolo, Waived   Acute non-recurrent sinusitis, unspecified location       Relevant Medications   benzonatate (TESSALON) 100 MG capsule    amoxicillin-clavulanate (AUGMENTIN) 875-125 MG tablet       Follow up plan: Return in  about 6 months (around 05/12/2017) for Physical Exam.

## 2016-11-12 NOTE — Assessment & Plan Note (Signed)
The current medical regimen is effective;  continue present plan and medications.  

## 2016-11-13 LAB — BASIC METABOLIC PANEL
BUN/Creatinine Ratio: 19 (ref 12–28)
BUN: 15 mg/dL (ref 8–27)
CHLORIDE: 104 mmol/L (ref 96–106)
CO2: 24 mmol/L (ref 18–29)
Calcium: 9.2 mg/dL (ref 8.7–10.3)
Creatinine, Ser: 0.81 mg/dL (ref 0.57–1.00)
GFR, EST AFRICAN AMERICAN: 88 mL/min/{1.73_m2} (ref >59)
GFR, EST NON AFRICAN AMERICAN: 76 mL/min/{1.73_m2} (ref >59)
Glucose: 98 mg/dL (ref 65–99)
POTASSIUM: 4.6 mmol/L (ref 3.5–5.2)
SODIUM: 144 mmol/L (ref 134–144)

## 2016-11-16 ENCOUNTER — Encounter: Payer: Self-pay | Admitting: Family Medicine

## 2016-12-28 ENCOUNTER — Other Ambulatory Visit: Payer: Self-pay | Admitting: Family Medicine

## 2016-12-28 MED ORDER — BENZONATATE 100 MG PO CAPS: 100.0000 mg | ORAL_CAPSULE | Freq: Two times a day (BID) | ORAL | 0 refills | Status: DC | PRN

## 2016-12-28 MED ORDER — AMOXICILLIN-POT CLAVULANATE 875-125 MG PO TABS: 1.0000 | ORAL_TABLET | Freq: Two times a day (BID) | ORAL | 0 refills | Status: DC

## 2016-12-28 NOTE — Telephone Encounter (Signed)
Patient called to see if Dr Dossie Arbour had called in refills on her antibiotic and cough med  [facility]

## 2016-12-28 NOTE — Telephone Encounter (Signed)
Routing to provider. Last visit 11/12/16

## 2017-01-27 ENCOUNTER — Encounter: Payer: Self-pay | Admitting: Family Medicine

## 2017-01-27 ENCOUNTER — Ambulatory Visit (INDEPENDENT_AMBULATORY_CARE_PROVIDER_SITE_OTHER): Payer: 59 | Admitting: Family Medicine

## 2017-01-27 DIAGNOSIS — M62838 Other muscle spasm: Secondary | ICD-10-CM | POA: Diagnosis not present

## 2017-01-27 DIAGNOSIS — F3342 Major depressive disorder, recurrent, in full remission: Secondary | ICD-10-CM | POA: Diagnosis not present

## 2017-01-27 MED ORDER — MELOXICAM 15 MG PO TABS: 15.0000 mg | ORAL_TABLET | Freq: Every day | ORAL | 3 refills | Status: DC

## 2017-01-27 MED ORDER — CYCLOBENZAPRINE HCL 5 MG PO TABS: 5.0000 mg | ORAL_TABLET | Freq: Three times a day (TID) | ORAL | 1 refills | Status: DC | PRN

## 2017-01-27 NOTE — Assessment & Plan Note (Signed)
Marked right trapezius muscle spasming discuss stretching range of motion strengthening massage therapy use of cyclobenzaprine and cautions about drowsiness meloxicam.

## 2017-01-27 NOTE — Progress Notes (Signed)
BP 129/76   Pulse 81   SpO2 99%    Subjective:    Patient ID: Annette Miller, female    DOB: 11/04/1950, 66 y.o.   MRN: [id number]  HPI: Annette Miller is a 66 y.o. female  Chief Complaint  Patient presents with  . 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 Hurts throughout neck and shoulder area with marked tension and spasming in this area and tenderness. No rash is really started yesterday but got much worse today. Taking Crestor with no myalgias takes Valium at bedtime and does okay with sleep.  Relevant past medical, surgical, family and social history reviewed and updated as indicated. Interim medical history since our last visit reviewed. Allergies and medications reviewed and updated.  Review of Systems  Constitutional: Negative.   Respiratory: Negative.   Cardiovascular: Negative.     Per HPI unless specifically indicated above     Objective:    BP 129/76   Pulse 81   SpO2 99%   Wt Readings from Last 3 Encounters:  11/12/16 135 lb (61.2 kg)  05/12/16 131 lb (59.4 kg)  12/11/15 132 lb 6.4 oz (60.1 kg)    Physical Exam  Constitutional: She is oriented to person, place, and time. She appears well-developed and well-nourished.  HENT:  Head: Normocephalic and atraumatic.  Eyes: Conjunctivae and EOM are normal.  Neck: Normal range of motion.  Cardiovascular: Normal rate, regular rhythm and normal heart sounds.   Pulmonary/Chest: Effort normal and breath sounds normal.  Musculoskeletal: Normal range of motion.  Marked tenderness in right trapezius area no rash  Neurological: She is alert and oriented to person, place, and time.  Skin: No erythema.  Psychiatric: She has a normal mood and affect. Her behavior is normal. Judgment and thought content normal.    Results for orders placed or performed in visit on 11/12/16  Basic metabolic panel  Result Value Ref  Range   Glucose 98 65 - 99 mg/dL   BUN 15 8 - 27 mg/dL   Creatinine, Ser 4.09 0.57 - 1.00 mg/dL   GFR calc non Af Amer 76 >59 mL/min/1.73   GFR calc Af Amer 88 >59 mL/min/1.73   BUN/Creatinine Ratio 19 12 - 28   Sodium 144 134 - 144 mmol/L   Potassium 4.6 3.5 - 5.2 mmol/L   Chloride 104 96 - 106 mmol/L   CO2 24 18 - 29 mmol/L   Calcium 9.2 8.7 - 10.3 mg/dL  LP+ALT+AST Piccolo, Waived  Result Value Ref Range   ALT (SGPT) Piccolo, Waived 15 10 - 47 U/L   AST (SGOT) Piccolo, Waived 19 11 - 38 U/L   Cholesterol Piccolo, Waived 173 <200 mg/dL   HDL Chol Piccolo, Waived 59 >59 mg/dL   Triglycerides Piccolo,Waived 210 (H) <150 mg/dL   Chol/HDL Ratio Piccolo,Waive 2.9 mg/dL   LDL Chol Calc Piccolo Waived 72 <100 mg/dL   VLDL Chol Calc Piccolo,Waive 42 (H) <30 mg/dL      Assessment & Plan:   Problem List Items Addressed This Visit      Musculoskeletal and Integument   Trapezius muscle spasm    Marked right trapezius muscle spasming discuss stretching range of motion strengthening massage therapy use of cyclobenzaprine and cautions about drowsiness meloxicam.        Other   Depression    Continue with the Valium she is  taking as that may help with some muscle spasming takes it primarily at nighttime depression otherwise stable          Follow up plan: Return for As scheduled.

## 2017-01-27 NOTE — Assessment & Plan Note (Signed)
Continue with the Valium she is taking as that may help with some muscle spasming takes it primarily at nighttime depression otherwise stable

## 2017-02-01 ENCOUNTER — Other Ambulatory Visit: Payer: Self-pay | Admitting: Family Medicine

## 2017-02-01 NOTE — Telephone Encounter (Signed)
Last OV: 01/27/17 Next OV: 05/20/17  Lab Results  Component Value Date   TSH 1.010 05/12/2016

## 2017-03-17 ENCOUNTER — Telehealth: Payer: Self-pay | Admitting: Family Medicine

## 2017-03-17 NOTE — Telephone Encounter (Signed)
Patient is coughing and having sinus issues. Patient is out of her z-pac medication and would like to see about getting a prescription for Augmentin. Patient stated she has been prescribed Augmentin before and feels that will help her better. Patient aware PCP is out of office. Patient would like to receive a call once medication is ready.   Please advise.  Thank you

## 2017-03-17 NOTE — Telephone Encounter (Signed)
Called and let pt know that she would need to be seen for medication. Pt verbalized understanding and will call back to schedule if needed.

## 2017-05-12 ENCOUNTER — Other Ambulatory Visit: Payer: Self-pay | Admitting: Family Medicine

## 2017-05-12 DIAGNOSIS — F329 Major depressive disorder, single episode, unspecified: Secondary | ICD-10-CM

## 2017-05-12 DIAGNOSIS — F32A Depression, unspecified: Secondary | ICD-10-CM

## 2017-05-16 ENCOUNTER — Other Ambulatory Visit: Payer: Self-pay | Admitting: Family Medicine

## 2017-05-16 DIAGNOSIS — F329 Major depressive disorder, single episode, unspecified: Secondary | ICD-10-CM

## 2017-05-16 DIAGNOSIS — F32A Depression, unspecified: Secondary | ICD-10-CM

## 2017-05-17 ENCOUNTER — Telehealth: Payer: Self-pay | Admitting: Family Medicine

## 2017-05-17 NOTE — Telephone Encounter (Signed)
Pt called and stated that her diazepam isn't at the pharmacy and she would like to know if it could be done a second time.

## 2017-05-18 NOTE — Telephone Encounter (Signed)
[facility] to  [id number] [place], [place] - [address] [phone] (Phone) [phone] (Fax)

## 2017-05-20 ENCOUNTER — Ambulatory Visit (INDEPENDENT_AMBULATORY_CARE_PROVIDER_SITE_OTHER): Payer: 59 | Admitting: Family Medicine

## 2017-05-20 ENCOUNTER — Encounter: Payer: Self-pay | Admitting: Family Medicine

## 2017-05-20 VITALS — BP 106/69 | HR 105 | Ht 62.21 in | Wt 130.0 lb

## 2017-05-20 DIAGNOSIS — Z1159 Encounter for screening for other viral diseases: Secondary | ICD-10-CM | POA: Diagnosis not present

## 2017-05-20 DIAGNOSIS — Z1239 Encounter for other screening for malignant neoplasm of breast: Secondary | ICD-10-CM

## 2017-05-20 DIAGNOSIS — T7840XA Allergy, unspecified, initial encounter: Secondary | ICD-10-CM | POA: Insufficient documentation

## 2017-05-20 DIAGNOSIS — Z131 Encounter for screening for diabetes mellitus: Secondary | ICD-10-CM

## 2017-05-20 DIAGNOSIS — F419 Anxiety disorder, unspecified: Secondary | ICD-10-CM | POA: Diagnosis not present

## 2017-05-20 DIAGNOSIS — E7800 Pure hypercholesterolemia, unspecified: Secondary | ICD-10-CM

## 2017-05-20 DIAGNOSIS — T7840XD Allergy, unspecified, subsequent encounter: Secondary | ICD-10-CM

## 2017-05-20 DIAGNOSIS — E785 Hyperlipidemia, unspecified: Secondary | ICD-10-CM | POA: Diagnosis not present

## 2017-05-20 DIAGNOSIS — E039 Hypothyroidism, unspecified: Secondary | ICD-10-CM

## 2017-05-20 DIAGNOSIS — F325 Major depressive disorder, single episode, in full remission: Secondary | ICD-10-CM

## 2017-05-20 DIAGNOSIS — Z780 Asymptomatic menopausal state: Secondary | ICD-10-CM

## 2017-05-20 DIAGNOSIS — Z0000 Encounter for general adult medical examination without abnormal findings: Secondary | ICD-10-CM

## 2017-05-20 DIAGNOSIS — F3342 Major depressive disorder, recurrent, in full remission: Secondary | ICD-10-CM | POA: Diagnosis not present

## 2017-05-20 LAB — URINALYSIS, ROUTINE W REFLEX MICROSCOPIC
BILIRUBIN UA: NEGATIVE
Glucose, UA: NEGATIVE
Leukocytes, UA: NEGATIVE
Nitrite, UA: NEGATIVE
PH UA: 6.5 (ref 5.0–7.5)
Specific Gravity, UA: 1.025 (ref 1.005–1.030)
Urobilinogen, Ur: 0.2 mg/dL (ref 0.2–1.0)

## 2017-05-20 LAB — MICROSCOPIC EXAMINATION: Bacteria, UA: NONE SEEN

## 2017-05-20 MED ORDER — LORATADINE 10 MG PO TABS: 10.0000 mg | ORAL_TABLET | Freq: Every day | ORAL | 11 refills | Status: DC

## 2017-05-20 MED ORDER — LEVOTHYROXINE SODIUM 25 MCG PO TABS: 25.0000 ug | ORAL_TABLET | Freq: Every day | ORAL | 4 refills | Status: DC

## 2017-05-20 MED ORDER — FLUTICASONE PROPIONATE 50 MCG/ACT NA SUSP: 2.0000 | Freq: Every day | NASAL | 12 refills | Status: DC

## 2017-05-20 MED ORDER — ROSUVASTATIN CALCIUM 10 MG PO TABS: 10.0000 mg | ORAL_TABLET | Freq: Every day | ORAL | 4 refills | Status: DC

## 2017-05-20 MED ORDER — BUPROPION HCL ER (SR) 150 MG PO TB12: 150.0000 mg | ORAL_TABLET | Freq: Two times a day (BID) | ORAL | 4 refills | Status: DC

## 2017-05-20 MED ORDER — AZELASTINE HCL 0.1 % NA SOLN: 2.0000 | Freq: Two times a day (BID) | NASAL | 12 refills | Status: DC

## 2017-05-20 MED ORDER — MELOXICAM 15 MG PO TABS: 15.0000 mg | ORAL_TABLET | Freq: Every day | ORAL | 3 refills | Status: DC

## 2017-05-20 NOTE — Progress Notes (Signed)
BP 106/69   Pulse (!) 105   Ht 5' 2.21" (1.58 m)   Wt 130 lb (59 kg)   SpO2 98%   BMI 23.62 kg/m    Subjective:    Patient ID: Annette Miller, female    DOB: [birth year], 66 y.o.   MRN: [id number]  HPI: Annette Miller is a 66 y.o. female  Chief Complaint  Patient presents with  . Annual Exam  . Medication Problem    Allergy meds no longer working.   Patient follow-up physical allergies and medicines seems to be coping about not working anymore now in heart of allergy season bothered a lot uses Allegra and Zyrtec alternating morning and evening. Not using any nose sprays etc.  Anxiety patient taking Valium 5 mg morning and evening for anger and control issues especially with very frustrating and difficult job. Patient's been taking faithfully long-term without escalation of dose will continue medications.  Takes thyroid without problems. Takes bupropion without problems Also takes Crestor without problems.  Relevant past medical, surgical, family and social history reviewed and updated as indicated. Interim medical history since our last visit reviewed. Allergies and medications reviewed and updated.  Review of Systems  Constitutional: Negative.   HENT: Negative.   Eyes: Negative.   Respiratory: Negative.   Cardiovascular: Negative.   Gastrointestinal: Negative.   Endocrine: Negative.   Genitourinary: Negative.   Musculoskeletal: Negative.   Skin: Negative.   Allergic/Immunologic: Negative.   Neurological: Negative.   Hematological: Negative.   Psychiatric/Behavioral: Negative.     Per HPI unless specifically indicated above     Objective:    BP 106/69   Pulse (!) 105   Ht 5' 2.21" (1.58 m)   Wt 130 lb (59 kg)   SpO2 98%   BMI 23.62 kg/m   Wt Readings from Last 3 Encounters:  05/20/17 130 lb (59 kg)  11/12/16 135 lb (61.2 kg)  05/12/16 131 lb (59.4 kg)    Physical Exam  Constitutional: She is oriented to person, place, and time. She appears  well-developed and well-nourished.  HENT:  Head: Normocephalic and atraumatic.  Right Ear: External ear normal.  Left Ear: External ear normal.  Nose: Nose normal.  Mouth/Throat: Oropharynx is clear and moist.  Eyes: Pupils are equal, round, and reactive to light. Conjunctivae and EOM are normal.  Neck: Normal range of motion. Neck supple. Carotid bruit is not present.  Cardiovascular: Normal rate, regular rhythm and normal heart sounds.   No murmur heard. Pulmonary/Chest: Effort normal and breath sounds normal. She exhibits no mass. Right breast exhibits no mass, no skin change and no tenderness. Left breast exhibits no mass, no skin change and no tenderness. Breasts are symmetrical.  Abdominal: Soft. Bowel sounds are normal. There is no hepatosplenomegaly.  Musculoskeletal: Normal range of motion.  Neurological: She is alert and oriented to person, place, and time.  Skin: No rash noted.  Psychiatric: She has a normal mood and affect. Her behavior is normal. Judgment and thought content normal.    Results for orders placed or performed in visit on 11/12/16  Basic metabolic panel  Result Value Ref Range   Glucose 98 65 - 99 mg/dL   BUN 15 8 - 27 mg/dL   Creatinine, Ser 4.09 0.57 - 1.00 mg/dL   GFR calc non Af Amer 76 >59 mL/min/1.73   GFR calc Af Amer 88 >59 mL/min/1.73   BUN/Creatinine Ratio 19 12 - 28   Sodium 144 134 - 144 mmol/L  Potassium 4.6 3.5 - 5.2 mmol/L   Chloride 104 96 - 106 mmol/L   CO2 24 18 - 29 mmol/L   Calcium 9.2 8.7 - 10.3 mg/dL  LP+ALT+AST Piccolo, Waived  Result Value Ref Range   ALT (SGPT) Piccolo, Waived 15 10 - 47 U/L   AST (SGOT) Piccolo, Waived 19 11 - 38 U/L   Cholesterol Piccolo, Waived 173 <200 mg/dL   HDL Chol Piccolo, Waived 59 >59 mg/dL   Triglycerides Piccolo,Waived 210 (H) <150 mg/dL   Chol/HDL Ratio Piccolo,Waive 2.9 mg/dL   LDL Chol Calc Piccolo Waived 72 <100 mg/dL   VLDL Chol Calc Piccolo,Waive 42 (H) <30 mg/dL      Assessment &  Plan:   Problem List Items Addressed This Visit      Endocrine   Hypothyroidism - Primary    The current medical regimen is effective;  continue present plan and medications. a      Relevant Medications   levothyroxine (SYNTHROID, LEVOTHROID) 25 MCG tablet   Other Relevant Orders   TSH     Other   Hyperlipidemia    The current medical regimen is effective;  continue present plan and medications.       Relevant Medications   rosuvastatin (CRESTOR) 10 MG tablet   Other Relevant Orders   Lipid panel   Depression    The current medical regimen is effective;  continue present plan and medications.       Relevant Medications   buPROPion (WELLBUTRIN SR) 150 MG 12 hr tablet   Anxiety    Long-term anxiety controlled with Valium 5 mg twice a day Will continue current dose      Relevant Medications   buPROPion (WELLBUTRIN SR) 150 MG 12 hr tablet   Allergy    We'll try changing allergy medications      Relevant Medications   azelastine (ASTELIN) 0.1 % nasal spray   loratadine (CLARITIN) 10 MG tablet   fluticasone (FLONASE) 50 MCG/ACT nasal spray    Other Visit Diagnoses    Well adult exam       Relevant Orders   CBC with Differential/Platelet   Screening for diabetes mellitus (DM)       Relevant Orders   Comprehensive metabolic panel   Urinalysis, Routine w reflex microscopic   Need for hepatitis C screening test       Relevant Orders   Hepatitis C Antibody   Breast cancer screening       Relevant Orders   MM DIGITAL SCREENING BILATERAL   Postmenopausal estrogen deficiency       Relevant Orders   DG Bone Density       Follow up plan: Return in about 6 months (around 11/20/2017) for BMP,  Lipids, ALT, AST.

## 2017-05-20 NOTE — Assessment & Plan Note (Signed)
Long-term anxiety controlled with Valium 5 mg twice a day Will continue current dose

## 2017-05-20 NOTE — Assessment & Plan Note (Signed)
The current medical regimen is effective;  continue present plan and medications.  

## 2017-05-20 NOTE — Assessment & Plan Note (Signed)
We'll try changing allergy medications

## 2017-05-20 NOTE — Assessment & Plan Note (Signed)
The current medical regimen is effective;  continue present plan and medications. a 

## 2017-05-21 LAB — HEPATITIS C ANTIBODY: HEP C VIRUS AB: 0.1 {s_co_ratio} (ref 0.0–0.9)

## 2017-05-21 LAB — COMPREHENSIVE METABOLIC PANEL
ALT: 8 IU/L (ref 0–32)
AST: 15 IU/L (ref 0–40)
Albumin/Globulin Ratio: 1.1 — ABNORMAL LOW (ref 1.2–2.2)
Albumin: 3.9 g/dL (ref 3.6–4.8)
Alkaline Phosphatase: 97 IU/L (ref 39–117)
BUN/Creatinine Ratio: 20 (ref 12–28)
BUN: 14 mg/dL (ref 8–27)
Bilirubin Total: <0.2 mg/dL (ref 0.0–1.2)
CALCIUM: 10.1 mg/dL (ref 8.7–10.3)
CO2: 23 mmol/L (ref 20–29)
CREATININE: 0.7 mg/dL (ref 0.57–1.00)
Chloride: 102 mmol/L (ref 96–106)
GFR calc Af Amer: 104 mL/min/{1.73_m2} (ref >59)
GFR, EST NON AFRICAN AMERICAN: 91 mL/min/{1.73_m2} (ref >59)
Globulin, Total: 3.4 g/dL (ref 1.5–4.5)
Glucose: 91 mg/dL (ref 65–99)
Potassium: 4.5 mmol/L (ref 3.5–5.2)
Sodium: 139 mmol/L (ref 134–144)
TOTAL PROTEIN: 7.3 g/dL (ref 6.0–8.5)

## 2017-05-21 LAB — CBC WITH DIFFERENTIAL/PLATELET
BASOS ABS: 0 10*3/uL (ref 0.0–0.2)
Basos: 0 %
EOS (ABSOLUTE): 0.5 10*3/uL — AB (ref 0.0–0.4)
Eos: 6 %
Hematocrit: 33.3 % — ABNORMAL LOW (ref 34.0–46.6)
Hemoglobin: 10.7 g/dL — ABNORMAL LOW (ref 11.1–15.9)
IMMATURE GRANS (ABS): 0 10*3/uL (ref 0.0–0.1)
IMMATURE GRANULOCYTES: 0 %
LYMPHS ABS: 1.6 10*3/uL (ref 0.7–3.1)
Lymphs: 20 %
MCH: 31.9 pg (ref 26.6–33.0)
MCHC: 32.1 g/dL (ref 31.5–35.7)
MCV: 99 fL — AB (ref 79–97)
Monocytes Absolute: 0.5 10*3/uL (ref 0.1–0.9)
Monocytes: 7 %
NEUTROS ABS: 5.4 10*3/uL (ref 1.4–7.0)
Neutrophils: 67 %
PLATELETS: 509 10*3/uL — AB (ref 150–379)
RBC: 3.35 x10E6/uL — ABNORMAL LOW (ref 3.77–5.28)
RDW: 13.4 % (ref 12.3–15.4)
WBC: 8.1 10*3/uL (ref 3.4–10.8)

## 2017-05-21 LAB — LIPID PANEL
CHOL/HDL RATIO: 3.4 ratio (ref 0.0–4.4)
Cholesterol, Total: 158 mg/dL (ref 100–199)
HDL: 46 mg/dL (ref >39)
LDL CALC: 73 mg/dL (ref 0–99)
TRIGLYCERIDES: 194 mg/dL — AB (ref 0–149)
VLDL CHOLESTEROL CAL: 39 mg/dL (ref 5–40)

## 2017-05-21 LAB — TSH: TSH: 0.916 u[IU]/mL (ref 0.450–4.500)

## 2017-05-23 ENCOUNTER — Other Ambulatory Visit: Payer: Self-pay | Admitting: Family Medicine

## 2017-05-23 ENCOUNTER — Encounter: Payer: Self-pay | Admitting: Family Medicine

## 2017-05-23 DIAGNOSIS — D649 Anemia, unspecified: Secondary | ICD-10-CM

## 2017-05-23 NOTE — Progress Notes (Signed)
Repeat CBC 2-4 weeks for borderline anemia

## 2017-05-25 ENCOUNTER — Other Ambulatory Visit: Payer: Self-pay | Admitting: Family Medicine

## 2017-05-25 DIAGNOSIS — E785 Hyperlipidemia, unspecified: Secondary | ICD-10-CM

## 2017-05-25 NOTE — Telephone Encounter (Signed)
Last OV: 01/27/17 Next OV: 05/20/17  Lab Results  Component Value Date   CHOL 158 05/20/2017   HDL 46 05/20/2017   LDLCALC 73 05/20/2017   TRIG 194 (H) 05/20/2017   CHOLHDL 3.4 05/20/2017   Lab Results  Component Value Date   CREATININE 0.70 05/20/2017   BUN 14 05/20/2017   NA 139 05/20/2017   K 4.5 05/20/2017   CL 102 05/20/2017   CO2 23 05/20/2017

## 2017-06-10 ENCOUNTER — Other Ambulatory Visit: Payer: Self-pay | Admitting: Family Medicine

## 2017-06-13 ENCOUNTER — Other Ambulatory Visit: Payer: Self-pay | Admitting: Family Medicine

## 2017-06-13 DIAGNOSIS — F32A Depression, unspecified: Secondary | ICD-10-CM

## 2017-06-13 DIAGNOSIS — F329 Major depressive disorder, single episode, unspecified: Secondary | ICD-10-CM

## 2017-07-22 ENCOUNTER — Other Ambulatory Visit: Payer: Self-pay | Admitting: Family Medicine

## 2017-07-22 NOTE — Telephone Encounter (Signed)
States medication discontinued, unable to refill

## 2017-07-23 NOTE — Telephone Encounter (Signed)
Patient notified

## 2017-09-28 DIAGNOSIS — K219 Gastro-esophageal reflux disease without esophagitis: Secondary | ICD-10-CM

## 2017-09-28 HISTORY — DX: Gastro-esophageal reflux disease without esophagitis: K21.9

## 2017-09-29 ENCOUNTER — Encounter: Payer: Self-pay | Admitting: Family Medicine

## 2017-10-06 ENCOUNTER — Telehealth: Payer: Self-pay | Admitting: Family Medicine

## 2017-10-06 MED ORDER — BENZONATATE 100 MG PO CAPS: 100.0000 mg | ORAL_CAPSULE | Freq: Two times a day (BID) | ORAL | 0 refills | Status: DC | PRN

## 2017-10-06 MED ORDER — AMOXICILLIN-POT CLAVULANATE 875-125 MG PO TABS: 1.0000 | ORAL_TABLET | Freq: Two times a day (BID) | ORAL | 0 refills | Status: DC

## 2017-10-06 NOTE — Telephone Encounter (Signed)
Copied from CRM [id number]. Topic: Inquiry >> Oct 06, 2017  8:58 AM Anice PaganiniMunoz, Cordney Barstow I, NT wrote: Reason for CRM: Pt call and said she have chest congestion and pain in her back she want Doctor Dossie Arbourrissman To call her Some Antibiotic To her Pharmacy she is @ work her num @ work is [phone]

## 2017-10-14 ENCOUNTER — Telehealth: Payer: Self-pay | Admitting: Family Medicine

## 2017-10-14 NOTE — Telephone Encounter (Signed)
Patient needs office visit.  

## 2017-11-03 ENCOUNTER — Other Ambulatory Visit: Payer: Self-pay | Admitting: Family Medicine

## 2017-11-03 DIAGNOSIS — F32A Depression, unspecified: Secondary | ICD-10-CM

## 2017-11-03 DIAGNOSIS — F329 Major depressive disorder, single episode, unspecified: Secondary | ICD-10-CM

## 2017-11-04 NOTE — Telephone Encounter (Signed)
diazepam refill Last OV: 05/20/17 Last Refill:10/07/17 [facility] [place]

## 2017-11-05 ENCOUNTER — Other Ambulatory Visit: Payer: Self-pay | Admitting: Family Medicine

## 2017-11-05 DIAGNOSIS — F329 Major depressive disorder, single episode, unspecified: Secondary | ICD-10-CM

## 2017-11-05 DIAGNOSIS — F32A Depression, unspecified: Secondary | ICD-10-CM

## 2017-11-06 ENCOUNTER — Other Ambulatory Visit: Payer: Self-pay | Admitting: Family Medicine

## 2017-11-06 DIAGNOSIS — F32A Depression, unspecified: Secondary | ICD-10-CM

## 2017-11-06 DIAGNOSIS — F329 Major depressive disorder, single episode, unspecified: Secondary | ICD-10-CM

## 2017-11-23 ENCOUNTER — Ambulatory Visit: Payer: 59 | Admitting: Family Medicine

## 2017-12-08 ENCOUNTER — Emergency Department: Payer: Medicare Other

## 2017-12-08 ENCOUNTER — Other Ambulatory Visit: Payer: Self-pay

## 2017-12-08 ENCOUNTER — Inpatient Hospital Stay
Admission: EM | Admit: 2017-12-08 | Discharge: 2017-12-09 | DRG: 694 | Disposition: A | Discharge status: Home / Self Care | Payer: Medicare Other | Attending: Emergency Medicine | Admitting: Emergency Medicine

## 2017-12-08 DIAGNOSIS — E039 Hypothyroidism, unspecified: Secondary | ICD-10-CM | POA: Diagnosis not present

## 2017-12-08 DIAGNOSIS — F17210 Nicotine dependence, cigarettes, uncomplicated: Secondary | ICD-10-CM | POA: Diagnosis not present

## 2017-12-08 DIAGNOSIS — Z791 Long term (current) use of non-steroidal anti-inflammatories (NSAID): Secondary | ICD-10-CM | POA: Diagnosis not present

## 2017-12-08 DIAGNOSIS — Z79899 Other long term (current) drug therapy: Secondary | ICD-10-CM

## 2017-12-08 DIAGNOSIS — R1013 Epigastric pain: Secondary | ICD-10-CM | POA: Diagnosis present

## 2017-12-08 DIAGNOSIS — N200 Calculus of kidney: Principal | ICD-10-CM | POA: Diagnosis present

## 2017-12-08 DIAGNOSIS — M810 Age-related osteoporosis without current pathological fracture: Secondary | ICD-10-CM | POA: Diagnosis present

## 2017-12-08 DIAGNOSIS — F329 Major depressive disorder, single episode, unspecified: Secondary | ICD-10-CM | POA: Diagnosis not present

## 2017-12-08 DIAGNOSIS — K279 Peptic ulcer, site unspecified, unspecified as acute or chronic, without hemorrhage or perforation: Secondary | ICD-10-CM

## 2017-12-08 DIAGNOSIS — Z7982 Long term (current) use of aspirin: Secondary | ICD-10-CM

## 2017-12-08 DIAGNOSIS — E785 Hyperlipidemia, unspecified: Secondary | ICD-10-CM | POA: Diagnosis present

## 2017-12-08 LAB — COMPREHENSIVE METABOLIC PANEL
ALT: 8 U/L — AB (ref 14–54)
AST: 15 U/L (ref 15–41)
Albumin: 2.7 g/dL — ABNORMAL LOW (ref 3.5–5.0)
Alkaline Phosphatase: 84 U/L (ref 38–126)
Anion gap: 12 (ref 5–15)
BUN: 14 mg/dL (ref 6–20)
CALCIUM: 8.5 mg/dL — AB (ref 8.9–10.3)
CO2: 28 mmol/L (ref 22–32)
Chloride: 96 mmol/L — ABNORMAL LOW (ref 101–111)
Creatinine, Ser: 0.65 mg/dL (ref 0.44–1.00)
GFR calc Af Amer: >60 mL/min (ref >60)
GFR calc non Af Amer: >60 mL/min (ref >60)
GLUCOSE: 108 mg/dL — AB (ref 65–99)
Potassium: 2.8 mmol/L — ABNORMAL LOW (ref 3.5–5.1)
SODIUM: 136 mmol/L (ref 135–145)
Total Bilirubin: 0.5 mg/dL (ref 0.3–1.2)
Total Protein: 7.5 g/dL (ref 6.5–8.1)

## 2017-12-08 LAB — CBC
HEMATOCRIT: 25.8 % — AB (ref 35.0–47.0)
Hemoglobin: 9.5 g/dL — ABNORMAL LOW (ref 12.0–16.0)
MCH: 34.5 pg — ABNORMAL HIGH (ref 26.0–34.0)
MCHC: 36.7 g/dL — AB (ref 32.0–36.0)
MCV: 94 fL (ref 80.0–100.0)
Platelets: 857 10*3/uL — ABNORMAL HIGH (ref 150–440)
RBC: 2.75 MIL/uL — ABNORMAL LOW (ref 3.80–5.20)
RDW: 13.9 % (ref 11.5–14.5)
WBC: 10.6 10*3/uL (ref 3.6–11.0)

## 2017-12-08 LAB — LIPASE, BLOOD: LIPASE: 19 U/L (ref 11–51)

## 2017-12-08 MED ORDER — MORPHINE SULFATE (PF) 4 MG/ML IV SOLN
INTRAVENOUS | Status: AC
Start: 2017-12-08 — End: 2017-12-08
  Administered 2017-12-08: 4 mg via INTRAVENOUS
  Filled 2017-12-08: qty 1

## 2017-12-08 MED ORDER — ONDANSETRON HCL 4 MG/2ML IJ SOLN
INTRAMUSCULAR | Status: AC
  Administered 2017-12-08: 4 mg via INTRAVENOUS
  Filled 2017-12-08: qty 2

## 2017-12-08 MED ORDER — IOPAMIDOL (ISOVUE-300) INJECTION 61%
100.0000 mL | Freq: Once | INTRAVENOUS | Status: AC | PRN
  Administered 2017-12-08: 100 mL via INTRAVENOUS

## 2017-12-08 MED ORDER — ONDANSETRON HCL 4 MG/2ML IJ SOLN
4.0000 mg | Freq: Once | INTRAMUSCULAR | Status: AC
  Administered 2017-12-08: 4 mg via INTRAVENOUS

## 2017-12-08 MED ORDER — MORPHINE SULFATE (PF) 4 MG/ML IV SOLN
4.0000 mg | Freq: Once | INTRAVENOUS | Status: AC
  Administered 2017-12-08: 4 mg via INTRAVENOUS

## 2017-12-08 NOTE — ED Notes (Signed)
This RN assuming responsibility of care at this time 

## 2017-12-08 NOTE — ED Triage Notes (Signed)
Pt arrives to ED via [facility] from home with c/o nausea and vomiting x6 days. Pt denies diarrhea, no c/o CP or SHOB. Pt states she is here d/t being unable to keep down any PO intake over the last several days. Pt is A&O, in NAD; RR even, regular, and unlabored.

## 2017-12-08 NOTE — ED Notes (Signed)
Consulting civil engineerCharge RN note: Pt to ED lobby via EMS c/o n/v x6 days, zofran given en route.

## 2017-12-08 NOTE — ED Notes (Addendum)
Pt in reporting nausea/vomiting as a chief complaint that began 3 days ago. She reports that she has not had any sick contacts and the nausea/vomiting was not precipitated by anything. She is also reporting abdominal pain that is worse when she takes a breath and is reproducible when pressing on the abdomen. She reports the pain radiates to her back. Per patient she has been having this pain since a work related injury in January

## 2017-12-08 NOTE — ED Provider Notes (Signed)
[facility] Emergency Department Provider Note __   First MD Initiated Contact with Patient 12/08/17 2320     (approximate)  I have reviewed the triage vital signs and the nursing notes.   HISTORY  Chief Complaint Emesis and Nausea    HPI Annette Miller is a 67 y.o. female with below list of chronic medical conditions presents to the emergency department via EMS with epigastric abdominal pain which is currently 10 out of 10 associated with nausea and vomiting times 6 days.  Patient denies any diarrhea.  Patient denies any fever.  Patient denies any urinary symptoms.   Past Medical History:  Diagnosis Date  . Allergy   . Depression   . Hyperlipidemia   . Osteoporosis     Patient Active Problem List   Diagnosis Date Noted  . Kidney stone 12/08/2017  . Anxiety 05/20/2017  . Allergy 05/20/2017  . Trapezius muscle spasm 01/27/2017  . H/O total hysterectomy 05/12/2016  . Hyperlipidemia 05/08/2015  . Depression 05/08/2015  . Hypothyroidism 05/08/2015    Past Surgical History:  Procedure Laterality Date  . ABDOMINAL HYSTERECTOMY    . TONSILLECTOMY      Prior to Admission medications   Medication Sig Start Date End Date Taking? Authorizing Provider  acetaminophen (TYLENOL) 325 MG tablet Take 650 mg by mouth every 6 (six) hours as needed for mild pain or fever.   Yes [provider]  Ascorbic Acid (VITAMIN C) 1000 MG tablet Take 3,000 mg by mouth daily.   Yes [provider]  aspirin EC 325 MG tablet Take 325 mg by mouth daily.   Yes [provider]  buPROPion (WELLBUTRIN SR) 150 MG 12 hr tablet TAKE 1 TABLET (150 MG TOTAL) BY MOUTH 2 (TWO) TIMES DAILY. Patient taking differently: Take 150 mg by mouth daily.  06/14/17  Yes Johnson, Megan P, DO  Calcium Carb-Cholecalciferol (OYSTER SHELL CALCIUM PLUS D PO) Take 1 tablet by mouth daily.    Yes [provider]  diazepam (VALIUM) 5 MG tablet TAKE 1 TABLET BY MOUTH  TWICE A DAY 11/04/17  Yes Crissman, Redge Gainer, MD  levothyroxine (SYNTHROID, LEVOTHROID) 25 MCG tablet Take 1 tablet (25 mcg total) by mouth daily. 05/20/17  Yes Crissman, Redge Gainer, MD  loratadine (CLARITIN) 10 MG tablet Take 1 tablet (10 mg total) by mouth daily. 05/20/17  Yes Crissman, Redge Gainer, MD  magnesium 30 MG tablet Take 30 mg by mouth daily.    Yes [provider]  meloxicam (MOBIC) 15 MG tablet Take 1 tablet (15 mg total) by mouth daily. 05/20/17  Yes Crissman, Redge Gainer, MD  rosuvastatin (CRESTOR) 10 MG tablet Take 1 tablet (10 mg total) by mouth daily. 05/20/17  Yes Steele Sizer, MD  Selenium 200 MCG CAPS Take by mouth daily.   Yes [provider]  vitamin E 1000 UNIT capsule Take 1,000 Units by mouth 2 (two) times daily.   Yes [provider]  Zinc Sulfate (ZINC 15 PO) Take by mouth daily.   Yes [provider]  amoxicillin-clavulanate (AUGMENTIN) 875-125 MG tablet Take 1 tablet by mouth 2 (two) times daily. Patient not taking: Reported on 12/09/2017 10/06/17   Steele Sizer, MD  azelastine (ASTELIN) 0.1 % nasal spray Place 2 sprays into both nostrils 2 (two) times daily. Use in each nostril as directed Patient not taking: Reported on 12/09/2017 05/20/17   Steele Sizer, MD  benzonatate (TESSALON) 100 MG capsule Take 1 capsule (100  mg total) by mouth 2 (two) times daily as needed for cough. Patient not taking: Reported on 12/09/2017 10/06/17   Steele Sizer, MD  buPROPion [facility] SR) 150 MG 12 hr tablet Take 1 tablet (150 mg total) by mouth 2 (two) times daily. Patient not taking: Reported on 12/09/2017 05/20/17   Steele Sizer, MD  cyclobenzaprine (FLEXERIL) 5 MG tablet Take 1 tablet (5 mg total) by mouth 3 (three) times daily as needed for muscle spasms. Patient not taking: Reported on 12/09/2017 01/27/17   Steele Sizer, MD  fexofenadine (ALLEGRA) 180 MG tablet TAKE 1 TABLET (180 MG TOTAL) BY MOUTH DAILY. Patient not taking: Reported on 12/09/2017  06/10/17   Steele Sizer, MD  fluticasone [facility]) 50 MCG/ACT nasal spray Place 2 sprays into both nostrils daily. Patient not taking: Reported on 12/09/2017 05/20/17   Steele Sizer, MD  ondansetron (ZOFRAN ODT) 4 MG disintegrating tablet Take 1 tablet (4 mg total) by mouth every 8 (eight) hours as needed for nausea or vomiting. 12/09/17   Darci Current, MD  pantoprazole (PROTONIX) 40 MG tablet Take 1 tablet (40 mg total) by mouth daily. 12/09/17 01/08/18  Darci Current, MD  sucralfate (CARAFATE) 1 g tablet Take 1 tablet (1 g total) by mouth 2 (two) times daily. 12/09/17 01/08/18  Darci Current, MD    Allergies Codeine and Prednisone  Family History  Problem Relation Age of Onset  . Cancer Mother   . AAA (abdominal aortic aneurysm) Father   . Cancer Maternal Grandmother   . Stroke Maternal Grandmother   . Cancer Paternal Grandfather     Social History Social History   Tobacco Use  . Smoking status: Current Every Day Smoker    Packs/day: 0.10    Types: Cigarettes  . Smokeless tobacco: Never Used  Substance Use Topics  . Alcohol use: No  . Drug use: No    Review of Systems Constitutional: No fever/chills Eyes: No visual changes. ENT: No sore throat. Cardiovascular: Denies chest pain. Respiratory: Denies shortness of breath. Gastrointestinal: Positive for abdominal pain nausea and vomiting  genitourinary: Negative for dysuria. Musculoskeletal: Negative for neck pain.  Negative for back pain. Integumentary: Negative for rash. Neurological: Negative for headaches, focal weakness or numbness.   ____________________________________________   PHYSICAL EXAM:  VITAL SIGNS: ED Triage Vitals  Enc Vitals Group     BP 12/08/17 2052 (!) 138/57     Pulse Rate 12/08/17 2052 (!) 106     Resp 12/08/17 2052 18     Temp 12/08/17 2052 98.4 F (36.9 C)     Temp Source 12/08/17 2052 Oral     SpO2 12/08/17 2052 98 %     Weight 12/08/17 2040 54.4 kg (120 lb)     Height  12/08/17 2040 1.575 m (5\' 2" )     Head Circumference --      Peak Flow --      Pain Score 12/08/17 2040 6     Pain Loc --      Pain Edu? --      Excl. in GC? --     Constitutional: Alert and oriented. Well appearing and in no acute distress. Eyes: Conjunctivae are normal. Head: Atraumatic. Mouth/Throat: Mucous membranes are moist. Oropharynx non-erythematous. Neck: No stridor.   Cardiovascular: Normal rate, regular rhythm. Good peripheral circulation. Grossly normal heart sounds. Respiratory: Normal respiratory effort.  No retractions. Lungs CTAB. Gastrointestinal: Soft and nontender. No distention.  Musculoskeletal: No lower extremity tenderness nor  edema. No gross deformities of extremities. Neurologic:  Normal speech and language. No gross focal neurologic deficits are appreciated.  Skin:  Skin is warm, dry and intact. No rash noted. Psychiatric: Mood and affect are normal. Speech and behavior are normal.  ____________________________________________   LABS (all labs ordered are listed, but only abnormal results are displayed)  Labs Reviewed  COMPREHENSIVE METABOLIC PANEL - Abnormal; Notable for the following components:      Result Value   Potassium 2.8 (*)    Chloride 96 (*)    Glucose, Bld 108 (*)    Calcium 8.5 (*)    Albumin 2.7 (*)    ALT 8 (*)    All other components within normal limits  CBC - Abnormal; Notable for the following components:   RBC 2.75 (*)    Hemoglobin 9.5 (*)    HCT 25.8 (*)    MCH 34.5 (*)    MCHC 36.7 (*)    Platelets 857 (*)    All other components within normal limits  LIPASE, BLOOD    RADIOLOGY I, [place] Eain [place], personally viewed and evaluated these images (plain radiographs) as part of my medical decision making, as well as reviewing the written report by the radiologist.    Official radiology report(s): Ct Abdomen Pelvis W Contrast  Result Date: 12/09/2017 CLINICAL DATA:  67 year old female with nausea vomiting and  abdominal pain. EXAM: CT ABDOMEN AND PELVIS WITH CONTRAST TECHNIQUE: Multidetector CT imaging of the abdomen and pelvis was performed using the standard protocol following bolus administration of intravenous contrast. CONTRAST:  ISOVUE-300 IOPAMIDOL (ISOVUE-300) INJECTION 61% COMPARISON:  None. FINDINGS: Lower chest: The visualized lung bases are clear. No intra-abdominal free air or free fluid. Hepatobiliary: An 11 x 12 mm focal rounded area with enhancement of the periphery in the right lobe of the liver (series 2, image 13) is not characterized but may represent an area of vascular malformation or shunting or a hemangioma. Further characterization with MRI on a nonemergent basis recommended. The liver is otherwise unremarkable. No intrahepatic biliary ductal dilatation. The gallbladder is unremarkable. Pancreas: Unremarkable. No pancreatic ductal dilatation or surrounding inflammatory changes. Spleen: Normal in size without focal abnormality. Adrenals/Urinary Tract: Adrenal glands are unremarkable. Kidneys are normal, without renal calculi, focal lesion, or hydronephrosis. Bladder is unremarkable. Stomach/Bowel: There is a 2.5 cm focal ulceration of the posterior wall of the gastric antrum. There is extensive inflammatory changes and edema of the wall of the distal stomach. This most likely represents a peptic ulcer with associated inflammatory changes. However, a neoplastic process is not excluded. Further evaluation and direct visualization with endoscopy following resolution of active inflammation recommended. No extraluminal air or definite evidence of perforation. There is sigmoid diverticulosis with muscular hypertrophy. No active inflammatory changes. There is no bowel obstruction. The appendix is normal. Vascular/Lymphatic: There is advanced aortoiliac atherosclerotic disease. No portal venous gas. There is no adenopathy. Reproductive: Hysterectomy. Other: None Musculoskeletal: No acute or  significant osseous findings. IMPRESSION: 1. A 2.5 cm probable peptic ulcer in the distal stomach with associated severe inflammatory changes. Further evaluation with endoscopy following resolution of acute inflammatory process recommended to exclude malignancy. No definite evidence of perforation. 2. Sigmoid and colonic diverticulosis. No bowel obstruction. Normal appendix. 3.  Aortic Atherosclerosis (ICD10-I70.0). Electronically Signed   By: Elgie Collard M.D.   On: 12/09/2017 00:42       Procedures   ____________________________________________   INITIAL IMPRESSION / ASSESSMENT AND PLAN / ED COURSE  As part  of my medical decision making, I reviewed the following data within the electronic MEDICAL RECORD NUMBER   67 year old female present with above-stated history and physical exam secondary to epigastric abdominal pain nausea and vomiting.  CT scan of the abdomen pelvis performed which revealed a possible peptic ulcer.  Patient given IV morphine and Zofran in the emergency department improvement of pain following review of the CT scan patient given Carafate and Protonix.  Spoke with patient at length regarding the need for GI follow-up for endoscopy with biopsy.  Patient will be discharged home with Carafate and Protonix.     ____________________________________________  FINAL CLINICAL IMPRESSION(S) / ED DIAGNOSES  Final diagnoses:  Peptic ulcer     MEDICATIONS GIVEN DURING THIS VISIT:  Medications  morphine 4 MG/ML injection 4 mg (4 mg Intravenous Given 12/08/17 2330)  ondansetron (ZOFRAN) injection 4 mg (4 mg Intravenous Given 12/08/17 2330)  iopamidol (ISOVUE-300) 61 % injection 100 mL (100 mLs Intravenous Contrast Given 12/08/17 2346)  sucralfate (CARAFATE) tablet 1 g (1 g Oral Given 12/09/17 0112)  pantoprazole (PROTONIX) EC tablet 40 mg (40 mg Oral Given 12/09/17 0112)     ED Discharge Orders        Ordered    ondansetron (ZOFRAN ODT) 4 MG disintegrating tablet  Every 8  hours PRN     12/09/17 0133    pantoprazole (PROTONIX) 40 MG tablet  Daily     12/09/17 0133    sucralfate (CARAFATE) 1 g tablet  2 times daily     12/09/17 0133       Note:  This document was prepared using Dragon voice recognition software and may include unintentional dictation errors.    Darci CurrentBrown, Geneva N, MD 12/09/17 [phone]

## 2017-12-09 ENCOUNTER — Other Ambulatory Visit: Payer: Self-pay

## 2017-12-09 DIAGNOSIS — N200 Calculus of kidney: Secondary | ICD-10-CM | POA: Diagnosis not present

## 2017-12-09 DIAGNOSIS — R1013 Epigastric pain: Secondary | ICD-10-CM | POA: Diagnosis not present

## 2017-12-09 MED ORDER — ONDANSETRON 4 MG PO TBDP: 4.0000 mg | ORAL_TABLET | Freq: Three times a day (TID) | ORAL | 0 refills | Status: DC | PRN

## 2017-12-09 MED ORDER — PANTOPRAZOLE SODIUM 40 MG PO TBEC: 40.0000 mg | DELAYED_RELEASE_TABLET | Freq: Every day | ORAL | 0 refills | Status: DC

## 2017-12-09 MED ORDER — PANTOPRAZOLE SODIUM 40 MG PO TBEC
40.0000 mg | DELAYED_RELEASE_TABLET | Freq: Once | ORAL | Status: AC
  Administered 2017-12-09: 40 mg via ORAL
  Filled 2017-12-09: qty 1

## 2017-12-09 MED ORDER — SUCRALFATE 1 G PO TABS: 1.0000 g | ORAL_TABLET | Freq: Two times a day (BID) | ORAL | 1 refills | Status: DC

## 2017-12-09 MED ORDER — SUCRALFATE 1 G PO TABS
1.0000 g | ORAL_TABLET | Freq: Once | ORAL | Status: AC
  Administered 2017-12-09: 1 g via ORAL
  Filled 2017-12-09: qty 1

## 2017-12-09 MED ORDER — POTASSIUM CHLORIDE 20 MEQ PO PACK
40.0000 meq | PACK | Freq: Two times a day (BID) | ORAL | Status: DC
  Administered 2017-12-09: 40 meq via ORAL
  Filled 2017-12-09: qty 2

## 2017-12-15 ENCOUNTER — Ambulatory Visit (INDEPENDENT_AMBULATORY_CARE_PROVIDER_SITE_OTHER): Payer: Medicare Other | Admitting: Family Medicine

## 2017-12-15 ENCOUNTER — Encounter: Payer: Self-pay | Admitting: Family Medicine

## 2017-12-15 VITALS — BP 155/88 | HR 108 | Temp 98.0°F | Ht 62.0 in | Wt 111.8 lb

## 2017-12-15 DIAGNOSIS — K279 Peptic ulcer, site unspecified, unspecified as acute or chronic, without hemorrhage or perforation: Secondary | ICD-10-CM | POA: Diagnosis not present

## 2017-12-15 DIAGNOSIS — Z1231 Encounter for screening mammogram for malignant neoplasm of breast: Secondary | ICD-10-CM | POA: Diagnosis not present

## 2017-12-15 DIAGNOSIS — Z780 Asymptomatic menopausal state: Secondary | ICD-10-CM

## 2017-12-15 DIAGNOSIS — Z1239 Encounter for other screening for malignant neoplasm of breast: Secondary | ICD-10-CM

## 2017-12-15 DIAGNOSIS — E785 Hyperlipidemia, unspecified: Secondary | ICD-10-CM | POA: Diagnosis not present

## 2017-12-15 MED ORDER — PANTOPRAZOLE SODIUM 40 MG PO TBEC: 40.0000 mg | DELAYED_RELEASE_TABLET | Freq: Two times a day (BID) | ORAL | 2 refills | Status: DC

## 2017-12-15 MED ORDER — METRONIDAZOLE 500 MG PO TABS: 500.0000 mg | ORAL_TABLET | Freq: Two times a day (BID) | ORAL | 0 refills | Status: DC

## 2017-12-15 MED ORDER — AMOXICILLIN 500 MG PO TABS
1000.0000 mg | ORAL_TABLET | Freq: Two times a day (BID) | ORAL | 0 refills | Status: DC
Start: 2017-12-15 — End: 2018-01-13

## 2017-12-15 NOTE — Progress Notes (Signed)
BP (!) 155/88   Pulse (!) 108   Temp 98 F (36.7 C) (Oral)   Ht 5\' 2"  (1.575 m)   Wt 111 lb 12.8 oz (50.7 kg)   SpO2 98%   BMI 20.45 kg/m    Subjective:    Patient ID: Annette Miller, female    DOB: [birth year], 67 y.o.   MRN: [id number]  HPI: Annette Miller is a 67 y.o. female  Chief Complaint  Patient presents with  . Hyperlipidemia  . Follow-up  . Hypertension  Patient with follow-up ulcer disease from ER notes.  Patient peptic ulcer has not been treated with antibiotics.  Still having marked nausea vomiting cannot eat with marked weight loss. Recovering from fall at work.  Blood pressure elevated but still having pain with fractured right humerus.  Relevant past medical, surgical, family and social history reviewed and updated as indicated. Interim medical history since our last visit reviewed. Allergies and medications reviewed and updated.  Review of Systems  Constitutional: Negative.   Respiratory: Negative.   Cardiovascular: Negative.     Per HPI unless specifically indicated above     Objective:    BP (!) 155/88   Pulse (!) 108   Temp 98 F (36.7 C) (Oral)   Ht 5\' 2"  (1.575 m)   Wt 111 lb 12.8 oz (50.7 kg)   SpO2 98%   BMI 20.45 kg/m   Wt Readings from Last 3 Encounters:  12/15/17 111 lb 12.8 oz (50.7 kg)  12/08/17 120 lb (54.4 kg)  05/20/17 130 lb (59 kg)    Physical Exam  Constitutional: She is oriented to person, place, and time. She appears well-developed and well-nourished.  HENT:  Head: Normocephalic and atraumatic.  Eyes: Conjunctivae and EOM are normal.  Neck: Normal range of motion.  Cardiovascular: Normal rate, regular rhythm and normal heart sounds.  Pulmonary/Chest: Effort normal and breath sounds normal.  Musculoskeletal: Normal range of motion.  Neurological: She is alert and oriented to person, place, and time.  Skin: No erythema.  Psychiatric: She has a normal mood and affect. Her behavior is normal. Judgment and thought  content normal.    Results for orders placed or performed during the hospital encounter of 12/08/17  Lipase, blood  Result Value Ref Range   Lipase 19 11 - 51 U/L  Comprehensive metabolic panel  Result Value Ref Range   Sodium 136 135 - 145 mmol/L   Potassium 2.8 (L) 3.5 - 5.1 mmol/L   Chloride 96 (L) 101 - 111 mmol/L   CO2 28 22 - 32 mmol/L   Glucose, Bld 108 (H) 65 - 99 mg/dL   BUN 14 6 - 20 mg/dL   Creatinine, Ser 4.09 0.44 - 1.00 mg/dL   Calcium 8.5 (L) 8.9 - 10.3 mg/dL   Total Protein 7.5 6.5 - 8.1 g/dL   Albumin 2.7 (L) 3.5 - 5.0 g/dL   AST 15 15 - 41 U/L   ALT 8 (L) 14 - 54 U/L   Alkaline Phosphatase 84 38 - 126 U/L   Total Bilirubin 0.5 0.3 - 1.2 mg/dL   GFR calc non Af Amer >60 >60 mL/min   GFR calc Af Amer >60 >60 mL/min   Anion gap 12 5 - 15  CBC  Result Value Ref Range   WBC 10.6 3.6 - 11.0 K/uL   RBC 2.75 (L) 3.80 - 5.20 MIL/uL   Hemoglobin 9.5 (L) 12.0 - 16.0 g/dL   HCT 81.1 (L) 91.4 - 78.2 %  MCV 94.0 80.0 - 100.0 fL   MCH 34.5 (H) 26.0 - 34.0 pg   MCHC 36.7 (H) 32.0 - 36.0 g/dL   RDW 09.813.9 11.911.5 - 14.714.5 %   Platelets 857 (H) 150 - 440 K/uL      Assessment & Plan:   Problem List Items Addressed This Visit      Digestive   PUD (peptic ulcer disease)    Patient with diagnosed peptic ulcer disease from ER notes but no treatment with weight loss will start peptic ulcer treatment and GI referral.      Relevant Orders   Ambulatory referral to Gastroenterology     Other   Hyperlipidemia - Primary   Relevant Orders   Basic metabolic panel   LP+ALT+AST Piccolo, Waived    Other Visit Diagnoses    Breast cancer screening       Relevant Orders   MM DIGITAL SCREENING BILATERAL   Postmenopausal estrogen deficiency          Elevated blood pressure will observe for now if remains elevated will need to further evaluate  Follow up plan: Return if symptoms worsen or fail to improve, for Physical Exam, August .

## 2017-12-15 NOTE — Assessment & Plan Note (Signed)
Patient with diagnosed peptic ulcer disease from ER notes but no treatment with weight loss will start peptic ulcer treatment and GI referral.

## 2017-12-16 ENCOUNTER — Encounter: Payer: Self-pay | Admitting: Gastroenterology

## 2017-12-29 ENCOUNTER — Other Ambulatory Visit: Payer: Self-pay | Admitting: Family Medicine

## 2017-12-29 NOTE — Telephone Encounter (Signed)
Mobic refill request  LOV 12/15/17 with Dr. Dossie Arbourrissman  [facility] [address] [facility] [place] - [address]

## 2017-12-31 ENCOUNTER — Other Ambulatory Visit: Payer: Self-pay | Admitting: Family Medicine

## 2017-12-31 MED ORDER — MELOXICAM 15 MG PO TABS: 15.0000 mg | ORAL_TABLET | Freq: Every day | ORAL | 6 refills | Status: DC

## 2017-12-31 NOTE — Telephone Encounter (Signed)
[facility]  [place]  [facility]  For  meloxicam has  Been filled  And  Is  In  Waiting  Bin  - ok to  Discontinue   Rx  Was  Prescribed  12/29/2017  By Roosvelt Maserachel Lane

## 2017-12-31 NOTE — Telephone Encounter (Unsigned)
Copied from CRM [phone]. Topic: Quick Communication - See Telephone Encounter >> Dec 31, 2017 11:04 AM Arlyss Gandyichardson, Kissy Cielo N, NT wrote: CRM for notification. See Telephone encounter for: 12/31/17. Pt would like to see if refills can be added to her rx for meloxicam [facility](MOBIC) that was sent to the pharmacy on 12/29/17.

## 2017-12-31 NOTE — Telephone Encounter (Signed)
[facility]

## 2017-12-31 NOTE — Telephone Encounter (Signed)
All set and pended- where does she need it sent?

## 2018-01-05 ENCOUNTER — Telehealth: Payer: Self-pay | Admitting: Family Medicine

## 2018-01-05 NOTE — Telephone Encounter (Signed)
Call pt 

## 2018-01-05 NOTE — Telephone Encounter (Signed)
Copied from CRM [phone]. Topic: Quick Communication - See Telephone Encounter >> Jan 05, 2018  1:00 PM Terisa Starraylor, Brittany L wrote: CRM for notification. See Telephone encounter for: 01/05/18.  Patient said to please let Vonita MossMark Crissman know that she has finished the pantoprazole (PROTONIX) 40 MG tablet & the antibiotic and her stomach is still messed up. She has been throwing up for 5 days. She said it was four times today. She wants a nurse to call her back Call @ [phone]

## 2018-01-06 MED ORDER — ONDANSETRON HCL 4 MG PO TABS: 4.0000 mg | ORAL_TABLET | Freq: Three times a day (TID) | ORAL | 0 refills | Status: DC | PRN

## 2018-01-06 NOTE — Telephone Encounter (Signed)
Patient was transferred to provider for telephone conversation.   

## 2018-01-10 ENCOUNTER — Encounter: Payer: Self-pay | Admitting: Gastroenterology

## 2018-01-10 ENCOUNTER — Other Ambulatory Visit
Admission: RE | Admit: 2018-01-10 | Discharge: 2018-01-10 | Disposition: A | Discharge status: Home / Self Care | Payer: Medicare HMO | Source: Ambulatory Visit | Attending: Gastroenterology | Admitting: Gastroenterology

## 2018-01-10 ENCOUNTER — Ambulatory Visit (INDEPENDENT_AMBULATORY_CARE_PROVIDER_SITE_OTHER): Payer: Medicare HMO | Admitting: Gastroenterology

## 2018-01-10 ENCOUNTER — Other Ambulatory Visit: Payer: Self-pay

## 2018-01-10 VITALS — BP 130/72 | HR 114 | Temp 99.0°F | Ht 62.0 in | Wt 107.4 lb

## 2018-01-10 DIAGNOSIS — K279 Peptic ulcer, site unspecified, unspecified as acute or chronic, without hemorrhage or perforation: Secondary | ICD-10-CM

## 2018-01-10 DIAGNOSIS — Z1211 Encounter for screening for malignant neoplasm of colon: Secondary | ICD-10-CM | POA: Diagnosis not present

## 2018-01-10 DIAGNOSIS — D500 Iron deficiency anemia secondary to blood loss (chronic): Secondary | ICD-10-CM | POA: Insufficient documentation

## 2018-01-10 LAB — IRON AND TIBC
IRON: 29 ug/dL (ref 28–170)
Saturation Ratios: 10 % — ABNORMAL LOW (ref 10.4–31.8)
TIBC: 284 ug/dL (ref 250–450)
UIBC: 255 ug/dL

## 2018-01-10 LAB — CBC
HEMATOCRIT: 29 % — AB (ref 35.0–47.0)
Hemoglobin: 10 g/dL — ABNORMAL LOW (ref 12.0–16.0)
MCH: 33.3 pg (ref 26.0–34.0)
MCHC: 34.4 g/dL (ref 32.0–36.0)
MCV: 96.9 fL (ref 80.0–100.0)
Platelets: 527 10*3/uL — ABNORMAL HIGH (ref 150–440)
RBC: 2.99 MIL/uL — ABNORMAL LOW (ref 3.80–5.20)
RDW: 15.3 % — AB (ref 11.5–14.5)
WBC: 5.9 10*3/uL (ref 3.6–11.0)

## 2018-01-10 LAB — FERRITIN: FERRITIN: 19 ng/mL (ref 11–307)

## 2018-01-10 NOTE — Progress Notes (Signed)
Arlyss Repress, MD [address]  [address]  [place], [place] [address]  Main: [phone]  Fax: [phone]    Gastroenterology Consultation  Referring Provider:     Steele Sizer, MD Primary Care Physician:  Steele Sizer, MD Primary Gastroenterologist:  Dr. Arlyss Repress Reason for Consultation:     Peptic ulcer disease        HPI:   Annette Miller is a 67 y.o. female referred by Dr. Steele Sizer, MD  for consultation & management of recent diagnosis of peptic ulcer disease. She went to ER in 11/2017 secondary to 1 week history of upper abdominal pain associated with nausea and vomiting. She was found to have normocytic anemia, and underwent CT which revealed focal ulceration in the posterior wall of the gastric antrum. She was discharged home on sucralfate and pantoprazole. Subsequently, she was seen by Dr. Dossie Arbour and started her on triple therapy empirically for H. Pylori given peptic ulcer disease based on the CT. However, H. Pylori was not confirmed prior to initiation of therapy.she completed the treatment and currently on Protonix 40 mg twice a day. She is here in consultation for peptic ulcer disease. Patient has been a smoker since age of 51, has been taking aspirin 325 mg chronically for several years. She sustained left upper arm fracture in January, since then she said she stopped taking aspirin 325. She reports episodes of black stools around the time when she went to ER. Currently her stools are greenish brown. She reports losing about 24 pounds within last few months. She continues to have ongoing retrosternal chest pain and epigastric pain. She denies drinking alcohol, she used to be a nurse  NSAIDs: aspirin 325 mg until 09/2017  Antiplts/Anticoagulants/Anti thrombotics: none  GI Procedures:  Reports having had a colonoscopy more than 10 years ago She denies family history of GI malignancy  Past Medical History:  Diagnosis Date  . Allergy   .  Depression   . Hyperlipidemia   . Osteoporosis     Past Surgical History:  Procedure Laterality Date  . ABDOMINAL HYSTERECTOMY    . TONSILLECTOMY       Current Outpatient Medications:  .  amoxicillin (AMOXIL) 500 MG tablet, Take 2 tablets (1,000 mg total) by mouth 2 (two) times daily., Disp: 56 tablet, Rfl: 0 .  buPROPion (WELLBUTRIN SR) 150 MG 12 hr tablet, TAKE 1 TABLET (150 MG TOTAL) BY MOUTH 2 (TWO) TIMES DAILY. (Patient taking differently: Take 150 mg by mouth daily. ), Disp: 180 tablet, Rfl: 3 .  diazepam (VALIUM) 5 MG tablet, TAKE 1 TABLET BY MOUTH TWICE A DAY, Disp: 60 tablet, Rfl: 5 .  fluticasone (FLONASE) 50 MCG/ACT nasal spray, Place 2 sprays into both nostrils daily., Disp: 16 g, Rfl: 12 .  levothyroxine (SYNTHROID, LEVOTHROID) 25 MCG tablet, Take 1 tablet (25 mcg total) by mouth daily., Disp: 90 tablet, Rfl: 4 .  loratadine (CLARITIN) 10 MG tablet, Take 1 tablet (10 mg total) by mouth daily., Disp: 30 tablet, Rfl: 11 .  meloxicam (MOBIC) 15 MG tablet, Take 1 tablet (15 mg total) by mouth daily., Disp: 30 tablet, Rfl: 6 .  metroNIDAZOLE (FLAGYL) 500 MG tablet, Take 1 tablet (500 mg total) by mouth 2 (two) times daily., Disp: 28 tablet, Rfl: 0 .  ondansetron (ZOFRAN ODT) 4 MG disintegrating tablet, Take 1 tablet (4 mg total) by mouth every 8 (eight) hours as needed for nausea or vomiting., Disp: 20 tablet, Rfl: 0 .  ondansetron (ZOFRAN) 4 MG tablet, Take 1 tablet (4 mg total) by mouth every 8 (eight) hours as needed for nausea or vomiting., Disp: 20 tablet, Rfl: 0 .  pantoprazole (PROTONIX) 40 MG tablet, Take 1 tablet (40 mg total) by mouth 2 (two) times daily., Disp: 60 tablet, Rfl: 2 .  rosuvastatin (CRESTOR) 10 MG tablet, Take 1 tablet (10 mg total) by mouth daily., Disp: 90 tablet, Rfl: 4 .  acetaminophen (TYLENOL) 325 MG tablet, Take 650 mg by mouth every 6 (six) hours as needed for mild pain or fever., Disp: , Rfl:  .  Ascorbic Acid (VITAMIN C) 1000 MG tablet, Take 3,000  mg by mouth daily., Disp: , Rfl:  .  aspirin EC 325 MG tablet, Take 325 mg by mouth daily., Disp: , Rfl:  .  Calcium Carb-Cholecalciferol (OYSTER SHELL CALCIUM PLUS D PO), Take 1 tablet by mouth daily. , Disp: , Rfl:  .  magnesium 30 MG tablet, Take 30 mg by mouth daily. , Disp: , Rfl:  .  Selenium 200 MCG CAPS, Take by mouth daily., Disp: , Rfl:  .  sucralfate (CARAFATE) 1 g tablet, Take 1 tablet (1 g total) by mouth 2 (two) times daily., Disp: 60 tablet, Rfl: 1 .  vitamin E 1000 UNIT capsule, Take 1,000 Units by mouth 2 (two) times daily., Disp: , Rfl:  .  Zinc Sulfate (ZINC 15 PO), Take by mouth daily., Disp: , Rfl:    Family History  Problem Relation Age of Onset  . Cancer Mother   . AAA (abdominal aortic aneurysm) Father   . Cancer Maternal Grandmother   . Stroke Maternal Grandmother   . Cancer Paternal Grandfather      Social History   Tobacco Use  . Smoking status: Current Every Day Smoker    Packs/day: 0.10    Types: Cigarettes  . Smokeless tobacco: Never Used  Substance Use Topics  . Alcohol use: No  . Drug use: No    Allergies as of 01/10/2018 - Review Complete 01/10/2018  Allergen Reaction Noted  . Codeine Diarrhea and Nausea And Vomiting 04/30/2015  . Prednisone Other (See Comments) 12/09/2017    Review of Systems:    All systems reviewed and negative except where noted in HPI.   Physical Exam:  BP 130/72   Pulse (!) 114   Temp 99 F (37.2 C) (Oral)   Ht 5\' 2"  (1.575 m)   Wt 107 lb 6.4 oz (48.7 kg)   BMI 19.64 kg/m  No LMP recorded. Patient has had a hysterectomy.  General:   Alert,  Well-developed, well-nourished, pleasant and cooperative in NAD Head:  Normocephalic and atraumatic. Eyes:  Sclera clear, no icterus.   Conjunctiva pink. Ears:  Normal auditory acuity. Nose:  No deformity, discharge, or lesions. Mouth:  No deformity or lesions,oropharynx pink & moist. Neck:  Supple; no masses or thyromegaly. Lungs:  Respirations even and unlabored.   Clear throughout to auscultation.   No wheezes, crackles, or rhonchi. No acute distress. Heart:  Regular rate and rhythm; no murmurs, clicks, rubs, or gallops. Abdomen:  Normal bowel sounds. Soft, mild epigastric tenderness and non-distended without masses, hepatosplenomegaly or hernias noted.  No guarding or rebound tenderness.   Rectal: Not performed Msk:  Symmetrical without gross deformities. Good, equal movement & strength bilaterally. Pulses:  Normal pulses noted. Extremities:  No clubbing or edema.  No cyanosis. Neurologic:  Alert and oriented x3;  grossly normal neurologically. Skin:  Intact without significant lesions or rashes. No  jaundice. Lymph Nodes:  No significant cervical adenopathy. Psych:  Alert and cooperative. Normal mood and affect.  Imaging Studies: reviewed  Assessment and Plan:   DONNIKA KUCHER is a 67 y.o. Caucasian female with history of tobacco use, dyspepsia, found to have antral ulcer based on the CT in 11/2017, normocytic anemia is seen in consultation for further management of peptic ulcer disease  Peptic ulcer disease: Continue Protonix 40 mg twice daily Recommend EGD for further evaluation, rule out malignancy and confirm healing of the ulcer  Colon cancer screening: Recommend colonoscopy as she is overdue  Normocytic anemia: Check CBC, ferritin, iron studies  I have discussed alternative options, risks & benefits,  which include, but are not limited to, bleeding, infection, perforation,respiratory complication & drug reaction.  The patient agrees with this plan & written consent will be obtained.    Follow up in 3 months   Arlyss Repress, MD

## 2018-01-10 NOTE — Progress Notes (Signed)
Patient stated she stopped taking OTC meds and supplements due to her nausea.

## 2018-01-12 ENCOUNTER — Other Ambulatory Visit: Payer: Self-pay

## 2018-01-12 ENCOUNTER — Telehealth: Payer: Self-pay | Admitting: Gastroenterology

## 2018-01-12 MED ORDER — ONDANSETRON HCL 4 MG PO TABS: 4.0000 mg | ORAL_TABLET | Freq: Three times a day (TID) | ORAL | 0 refills | Status: DC | PRN

## 2018-01-12 NOTE — Telephone Encounter (Signed)
Pt has procedure tomorrow she needs rx for nausea called in to [facility] [place]  sher states she only has 2 rx sofrane left please call pt once send

## 2018-01-12 NOTE — Telephone Encounter (Signed)
Rx for Zofran 4 mg has been sent to pharmacy qty 7. Directions one q 8 hrs as needed for nausea. Patient has been notified.  Thanks [employer]

## 2018-01-12 NOTE — Telephone Encounter (Signed)
Ok to send zofran, 4mg  7pills  -RV

## 2018-01-13 ENCOUNTER — Encounter
Admission: RE | Disposition: A | Discharge status: Home / Self Care | Payer: Self-pay | Source: Ambulatory Visit | Attending: Gastroenterology

## 2018-01-13 ENCOUNTER — Ambulatory Visit: Payer: Medicare HMO | Admitting: Anesthesiology

## 2018-01-13 ENCOUNTER — Encounter: Payer: Self-pay | Admitting: Anesthesiology

## 2018-01-13 ENCOUNTER — Other Ambulatory Visit: Payer: Self-pay

## 2018-01-13 ENCOUNTER — Ambulatory Visit
Admission: RE | Admit: 2018-01-13 | Discharge: 2018-01-13 | Disposition: A | Discharge status: Home / Self Care | Payer: Medicare HMO | Source: Ambulatory Visit | Attending: Gastroenterology | Admitting: Gastroenterology

## 2018-01-13 DIAGNOSIS — E785 Hyperlipidemia, unspecified: Secondary | ICD-10-CM | POA: Insufficient documentation

## 2018-01-13 DIAGNOSIS — R69 Illness, unspecified: Secondary | ICD-10-CM | POA: Diagnosis not present

## 2018-01-13 DIAGNOSIS — K279 Peptic ulcer, site unspecified, unspecified as acute or chronic, without hemorrhage or perforation: Secondary | ICD-10-CM

## 2018-01-13 DIAGNOSIS — Z1211 Encounter for screening for malignant neoplasm of colon: Secondary | ICD-10-CM

## 2018-01-13 DIAGNOSIS — Z539 Procedure and treatment not carried out, unspecified reason: Secondary | ICD-10-CM | POA: Diagnosis present

## 2018-01-13 DIAGNOSIS — F329 Major depressive disorder, single episode, unspecified: Secondary | ICD-10-CM | POA: Insufficient documentation

## 2018-01-13 DIAGNOSIS — Z79899 Other long term (current) drug therapy: Secondary | ICD-10-CM | POA: Diagnosis not present

## 2018-01-13 HISTORY — DX: Hypothyroidism, unspecified: E03.9

## 2018-01-13 HISTORY — DX: Anemia, unspecified: D64.9

## 2018-01-13 HISTORY — DX: Unspecified osteoarthritis, unspecified site: M19.90

## 2018-01-13 HISTORY — DX: Nontoxic goiter, unspecified: E04.9

## 2018-01-13 LAB — POCT I-STAT 4, (NA,K, GLUC, HGB,HCT)
Glucose, Bld: 105 mg/dL — ABNORMAL HIGH (ref 65–99)
HCT: 30 % — ABNORMAL LOW (ref 36.0–46.0)
Hemoglobin: 10.2 g/dL — ABNORMAL LOW (ref 12.0–15.0)
POTASSIUM: 2.9 mmol/L — AB (ref 3.5–5.1)
SODIUM: 140 mmol/L (ref 135–145)

## 2018-01-13 SURGERY — COLONOSCOPY WITH PROPOFOL
Anesthesia: General

## 2018-01-13 MED ORDER — MIDAZOLAM HCL 2 MG/2ML IJ SOLN
INTRAMUSCULAR | Status: AC
  Filled 2018-01-13: qty 2

## 2018-01-13 MED ORDER — POTASSIUM CHLORIDE 20 MEQ PO PACK
60.0000 meq | PACK | ORAL | Status: DC
  Filled 2018-01-13: qty 3

## 2018-01-13 MED ORDER — LIDOCAINE HCL (PF) 2 % IJ SOLN
INTRAMUSCULAR | Status: AC
  Filled 2018-01-13: qty 10

## 2018-01-13 MED ORDER — SODIUM CHLORIDE 0.9 % IV SOLN: INTRAVENOUS | Status: DC

## 2018-01-13 MED ORDER — POTASSIUM CHLORIDE ER 10 MEQ PO TBCR: 40.0000 meq | EXTENDED_RELEASE_TABLET | Freq: Every day | ORAL | 0 refills | Status: DC

## 2018-01-13 MED ORDER — POTASSIUM CHLORIDE CRYS ER 20 MEQ PO TBCR
EXTENDED_RELEASE_TABLET | ORAL | Status: AC
  Administered 2018-01-13: 60 meq via ORAL
  Filled 2018-01-13: qty 3

## 2018-01-13 MED ORDER — PEG 3350-KCL-NABCB-NACL-NASULF 236 G PO SOLR: 4000.0000 mL | Freq: Once | ORAL | 0 refills | Status: AC

## 2018-01-13 NOTE — Anesthesia Preprocedure Evaluation (Signed)
Anesthesia Evaluation  Patient identified by MRN, date of birth, ID band Patient awake    Reviewed: Allergy & Precautions, NPO status , Patient's Chart, lab work & pertinent test results, reviewed documented beta blocker date and time   Airway Mallampati: II  TM Distance: >3 FB     Dental  (+) Chipped   Pulmonary Current Smoker,           Cardiovascular      Neuro/Psych PSYCHIATRIC DISORDERS Anxiety Depression  Neuromuscular disease    GI/Hepatic PUD,   Endo/Other  Hypothyroidism   Renal/GU Renal disease     Musculoskeletal   Abdominal   Peds  Hematology   Anesthesia Other Findings   Reproductive/Obstetrics                             Anesthesia Physical Anesthesia Plan  ASA: III  Anesthesia Plan: General   Post-op Pain Management:    Induction: Intravenous  PONV Risk Score and Plan:   Airway Management Planned:   Additional Equipment:   Intra-op Plan:   Post-operative Plan:   Informed Consent: I have reviewed the patients History and Physical, chart, labs and discussed the procedure including the risks, benefits and alternatives for the proposed anesthesia with the patient or authorized representative who has indicated his/her understanding and acceptance.     Plan Discussed with: CRNA  Anesthesia Plan Comments:         Anesthesia Quick Evaluation

## 2018-01-13 NOTE — Progress Notes (Signed)
Prep no good

## 2018-01-14 ENCOUNTER — Ambulatory Visit
Admission: RE | Admit: 2018-01-14 | Discharge: 2018-01-14 | Disposition: A | Discharge status: Home / Self Care | Payer: Medicare HMO | Source: Ambulatory Visit | Attending: Gastroenterology | Admitting: Gastroenterology

## 2018-01-14 ENCOUNTER — Ambulatory Visit: Payer: Medicare HMO | Admitting: Anesthesiology

## 2018-01-14 ENCOUNTER — Encounter: Payer: Self-pay | Admitting: *Deleted

## 2018-01-14 ENCOUNTER — Encounter
Admission: RE | Disposition: A | Discharge status: Home / Self Care | Payer: Self-pay | Source: Ambulatory Visit | Attending: Gastroenterology

## 2018-01-14 DIAGNOSIS — K3189 Other diseases of stomach and duodenum: Secondary | ICD-10-CM | POA: Diagnosis not present

## 2018-01-14 DIAGNOSIS — Z1211 Encounter for screening for malignant neoplasm of colon: Secondary | ICD-10-CM | POA: Diagnosis not present

## 2018-01-14 DIAGNOSIS — K269 Duodenal ulcer, unspecified as acute or chronic, without hemorrhage or perforation: Secondary | ICD-10-CM | POA: Insufficient documentation

## 2018-01-14 DIAGNOSIS — Z885 Allergy status to narcotic agent status: Secondary | ICD-10-CM | POA: Insufficient documentation

## 2018-01-14 DIAGNOSIS — D62 Acute posthemorrhagic anemia: Secondary | ICD-10-CM | POA: Diagnosis not present

## 2018-01-14 DIAGNOSIS — D509 Iron deficiency anemia, unspecified: Secondary | ICD-10-CM | POA: Diagnosis not present

## 2018-01-14 DIAGNOSIS — Z888 Allergy status to other drugs, medicaments and biological substances status: Secondary | ICD-10-CM | POA: Insufficient documentation

## 2018-01-14 DIAGNOSIS — K279 Peptic ulcer, site unspecified, unspecified as acute or chronic, without hemorrhage or perforation: Secondary | ICD-10-CM

## 2018-01-14 DIAGNOSIS — E039 Hypothyroidism, unspecified: Secondary | ICD-10-CM | POA: Diagnosis not present

## 2018-01-14 DIAGNOSIS — Z791 Long term (current) use of non-steroidal anti-inflammatories (NSAID): Secondary | ICD-10-CM | POA: Diagnosis not present

## 2018-01-14 DIAGNOSIS — K5790 Diverticulosis of intestine, part unspecified, without perforation or abscess without bleeding: Secondary | ICD-10-CM | POA: Insufficient documentation

## 2018-01-14 DIAGNOSIS — E785 Hyperlipidemia, unspecified: Secondary | ICD-10-CM | POA: Insufficient documentation

## 2018-01-14 DIAGNOSIS — Z79899 Other long term (current) drug therapy: Secondary | ICD-10-CM | POA: Insufficient documentation

## 2018-01-14 DIAGNOSIS — Z79890 Hormone replacement therapy (postmenopausal): Secondary | ICD-10-CM | POA: Diagnosis not present

## 2018-01-14 DIAGNOSIS — F329 Major depressive disorder, single episode, unspecified: Secondary | ICD-10-CM | POA: Insufficient documentation

## 2018-01-14 DIAGNOSIS — F17210 Nicotine dependence, cigarettes, uncomplicated: Secondary | ICD-10-CM | POA: Diagnosis not present

## 2018-01-14 DIAGNOSIS — R69 Illness, unspecified: Secondary | ICD-10-CM | POA: Diagnosis not present

## 2018-01-14 DIAGNOSIS — K2950 Unspecified chronic gastritis without bleeding: Secondary | ICD-10-CM | POA: Diagnosis not present

## 2018-01-14 HISTORY — PX: ESOPHAGOGASTRODUODENOSCOPY (EGD) WITH PROPOFOL: SHX5813

## 2018-01-14 HISTORY — PX: COLONOSCOPY WITH PROPOFOL: SHX5780

## 2018-01-14 LAB — POTASSIUM: POTASSIUM: 3.2 mmol/L — AB (ref 3.5–5.1)

## 2018-01-14 SURGERY — COLONOSCOPY WITH PROPOFOL
Anesthesia: General

## 2018-01-14 MED ORDER — SODIUM CHLORIDE 0.9 % IV SOLN
INTRAVENOUS | Status: DC
  Administered 2018-01-14: 08:00:00 via INTRAVENOUS

## 2018-01-14 MED ORDER — PROPOFOL 500 MG/50ML IV EMUL
INTRAVENOUS | Status: DC | PRN
  Administered 2018-01-14: 150 ug/kg/min via INTRAVENOUS

## 2018-01-14 MED ORDER — LIDOCAINE HCL (CARDIAC) PF 100 MG/5ML IV SOSY
PREFILLED_SYRINGE | INTRAVENOUS | Status: DC | PRN
  Administered 2018-01-14: 30 mg via INTRAVENOUS
  Administered 2018-01-14: 80 mg via INTRAVENOUS

## 2018-01-14 MED ORDER — PROPOFOL 500 MG/50ML IV EMUL
INTRAVENOUS | Status: AC
  Filled 2018-01-14: qty 50

## 2018-01-14 MED ORDER — LIDOCAINE HCL (PF) 2 % IJ SOLN
INTRAMUSCULAR | Status: AC
  Filled 2018-01-14: qty 10

## 2018-01-14 MED ORDER — PROPOFOL 10 MG/ML IV BOLUS
INTRAVENOUS | Status: DC | PRN
  Administered 2018-01-14: 60 mg via INTRAVENOUS

## 2018-01-14 NOTE — Op Note (Signed)
[facility] Gastroenterology Patient Name: Annette Miller Procedure Date: 01/14/2018 8:09 AM MRN: [id number] Account #: [id number] Date of Birth: [birth year] Admit Type: Outpatient Age: 66 Room: [facility] ENDO ROOM 2 Gender: Female Note Status: Finalized Procedure:            Upper GI endoscopy Indications:          Acute post hemorrhagic anemia, Iron deficiency anemia                        due to suspected upper gastrointestinal bleeding,                        Peptic ulcer Providers:            Lin Landsman MD, MD Referring MD:         Guadalupe Maple, MD (Referring MD) Medicines:            Monitored Anesthesia Care Complications:        No immediate complications. Estimated blood loss: None. Procedure:            Pre-Anesthesia Assessment:                       - Prior to the procedure, a History and Physical was                        performed, and patient medications and allergies were                        reviewed. The patient is competent. The risks and                        benefits of the procedure and the sedation options and                        risks were discussed with the patient. All questions                        were answered and informed consent was obtained.                        Patient identification and proposed procedure were                        verified by the physician, the nurse, the                        anesthesiologist, the anesthetist and the technician in                        the pre-procedure area in the procedure room in the                        endoscopy suite. Mental Status Examination: alert and                        oriented. Airway Examination: normal oropharyngeal                        airway and neck mobility. Respiratory Examination:  clear to auscultation. CV Examination: normal.                        Prophylactic Antibiotics: The patient does not require   prophylactic antibiotics. Prior Anticoagulants: The                        patient has taken no previous anticoagulant or                        antiplatelet agents. ASA Grade Assessment: III - A                        patient with severe systemic disease. After reviewing                        the risks and benefits, the patient was deemed in                        satisfactory condition to undergo the procedure. The                        anesthesia plan was to use monitored anesthesia care                        (MAC). Immediately prior to administration of                        medications, the patient was re-assessed for adequacy                        to receive sedatives. The heart rate, respiratory rate,                        oxygen saturations, blood pressure, adequacy of                        pulmonary ventilation, and response to care were                        monitored throughout the procedure. The physical status                        of the patient was re-assessed after the procedure.                       After obtaining informed consent, the endoscope was                        passed under direct vision. Throughout the procedure,                        the patient's blood pressure, pulse, and oxygen                        saturations were monitored continuously. The Endoscope                        was introduced through the mouth, and advanced to the  second part of duodenum. The upper GI endoscopy was                        accomplished without difficulty. The patient tolerated                        the procedure well. Findings:      The gastroesophageal junction and examined esophagus were normal.      Diffuse moderately erythematous mucosa without bleeding was found in the       gastric body and in the gastric antrum. Biopsies were taken with a cold       forceps for Helicobacter pylori testing.      One non-obstructing non-bleeding  cratered duodenal ulcer with a flat       pigmented spot (Forrest Class IIc) was found in the duodenal bulb. The       lesion was 10 mm in largest dimension. There is no evidence of       perforation. Potassium pill was sitting on base of the ulcer, used       tripod to displace the pill from base. Fulguration to ablate the lesion       to prevent bleeding by bipolar probe was successful.      The second portion of the duodenum was normal. Impression:           - Normal gastroesophageal junction and esophagus.                       - Erythematous mucosa in the gastric body and antrum.                        Biopsied.                       - One non-obstructing non-bleeding duodenal ulcer with                        a flat pigmented spot (Forrest Class IIc). Suspicious                        for NSAID induced etiology. There is no evidence of                        perforation. Treated with bipolar cautery.                       - Normal second portion of the duodenum. Recommendation:       - Await pathology results.                       - Use a proton pump inhibitor PO BID for 6 months.                       - No aspirin, ibuprofen, naproxen, or other                        non-steroidal anti-inflammatory drugs. Procedure Code(s):    --- Professional ---                       70962, 59, Esophagogastroduodenoscopy, flexible,  transoral; with control of bleeding, any method                       43239, Esophagogastroduodenoscopy, flexible, transoral;                        with biopsy, single or multiple Diagnosis Code(s):    --- Professional ---                       K31.89, Other diseases of stomach and duodenum                       K26.9, Duodenal ulcer, unspecified as acute or chronic,                        without hemorrhage or perforation                       D62, Acute posthemorrhagic anemia                       D50.9, Iron deficiency anemia, unspecified                        K27.9, Peptic ulcer, site unspecified, unspecified as                        acute or chronic, without hemorrhage or perforation CPT copyright 2017 American Medical Association. All rights reserved. The codes documented in this report are preliminary and upon coder review may  be revised to meet current compliance requirements. Dr. Ulyess Mort Lin Landsman MD, MD 01/14/2018 8:36:25 AM This report has been signed electronically. Number of Addenda: 0 Note Initiated On: 01/14/2018 8:09 AM      [facility]

## 2018-01-14 NOTE — Anesthesia Post-op Follow-up Note (Signed)
Anesthesia QCDR form completed.        

## 2018-01-14 NOTE — Anesthesia Postprocedure Evaluation (Signed)
Anesthesia Post Note  Patient: Annette Miller  Procedure(s) Performed: COLONOSCOPY WITH PROPOFOL (N/A ) ESOPHAGOGASTRODUODENOSCOPY (EGD) WITH PROPOFOL (N/A )  Patient location during evaluation: Endoscopy Anesthesia Type: General Level of consciousness: awake and alert Pain management: pain level controlled Vital Signs Assessment: post-procedure vital signs reviewed and stable Respiratory status: spontaneous breathing, nonlabored ventilation, respiratory function stable and patient connected to nasal cannula oxygen Cardiovascular status: blood pressure returned to baseline and stable Postop Assessment: no apparent nausea or vomiting Anesthetic complications: no     Last Vitals:  Vitals:   01/14/18 0952 01/14/18 1002  BP:  (!) 158/95  Pulse:  80  Resp:  13  Temp:    SpO2: 100% 98%    Last Pain:  Vitals:   01/14/18 1002  TempSrc:   PainSc: 5                  [name] S

## 2018-01-14 NOTE — H&P (Signed)
Arlyss Repress, MD [address]  [address]  [place], [place] [address]  Main: [phone]  Fax: [phone] Pager: [phone]  Primary Care Physician:  Steele Sizer, MD Primary Gastroenterologist:  Dr. Arlyss Repress  Pre-Procedure History & Physical: HPI:  Annette Miller is a 67 y.o. female is here for an endoscopy and colonoscopy.   Past Medical History:  Diagnosis Date  . Allergy   . Anemia   . Arthritis   . Depression   . Goiter   . Hyperlipidemia   . Hypothyroidism   . Osteoporosis     Past Surgical History:  Procedure Laterality Date  . ABDOMINAL HYSTERECTOMY    . TONSILLECTOMY      Prior to Admission medications   Medication Sig Start Date End Date Taking? Authorizing Provider  Ascorbic Acid (VITAMIN C) 1000 MG tablet Take 3,000 mg by mouth daily.   Yes [provider]  buPROPion (WELLBUTRIN SR) 150 MG 12 hr tablet TAKE 1 TABLET (150 MG TOTAL) BY MOUTH 2 (TWO) TIMES DAILY. Patient taking differently: Take 150 mg by mouth daily.  06/14/17  Yes Johnson, Megan P, DO  Calcium Carb-Cholecalciferol (OYSTER SHELL CALCIUM PLUS D PO) Take 1 tablet by mouth daily.    Yes [provider]  diazepam (VALIUM) 5 MG tablet TAKE 1 TABLET BY MOUTH TWICE A DAY 11/04/17  Yes Crissman, Redge Gainer, MD  levothyroxine (SYNTHROID, LEVOTHROID) 25 MCG tablet Take 1 tablet (25 mcg total) by mouth daily. 05/20/17  Yes Crissman, Redge Gainer, MD  meloxicam (MOBIC) 15 MG tablet Take 1 tablet (15 mg total) by mouth daily. 12/31/17  Yes Johnson, Megan P, DO  ondansetron (ZOFRAN ODT) 4 MG disintegrating tablet Take 1 tablet (4 mg total) by mouth every 8 (eight) hours as needed for nausea or vomiting. 12/09/17  Yes Darci Current, MD  pantoprazole (PROTONIX) 40 MG tablet Take 1 tablet (40 mg total) by mouth 2 (two) times daily. 12/15/17  Yes Crissman, Redge Gainer, MD  potassium chloride (K-DUR) 10 MEQ tablet Take 4 tablets (40 mEq total) by mouth daily for 2 days. 01/13/18 01/15/18 Yes  Vanga, Loel Dubonnet, MD  rosuvastatin (CRESTOR) 10 MG tablet Take 1 tablet (10 mg total) by mouth daily. 05/20/17  Yes Steele Sizer, MD  Selenium 200 MCG CAPS Take by mouth daily.   Yes [provider]  vitamin E 1000 UNIT capsule Take 1,000 Units by mouth 2 (two) times daily.   Yes [provider]  Zinc Sulfate (ZINC 15 PO) Take by mouth daily.   Yes [provider]  acetaminophen (TYLENOL) 325 MG tablet Take 650 mg by mouth every 6 (six) hours as needed for mild pain or fever.    [provider]  fluticasone (FLONASE) 50 MCG/ACT nasal spray Place 2 sprays into both nostrils daily. Patient not taking: Reported on 01/14/2018 05/20/17   Steele Sizer, MD  loratadine (CLARITIN) 10 MG tablet Take 1 tablet (10 mg total) by mouth daily. 05/20/17   Steele Sizer, MD  magnesium 30 MG tablet Take 30 mg by mouth daily.     [provider]  ondansetron (ZOFRAN) 4 MG tablet Take 1 tablet (4 mg total) by mouth every 8 (eight) hours as needed for nausea or vomiting. 01/06/18   Steele Sizer, MD  ondansetron (ZOFRAN) 4 MG tablet Take 1 tablet (4 mg total) by mouth every 8 (eight) hours as needed for nausea or vomiting. 01/12/18   Vanga, Rohini  Betti Cruzeddy, MD  sucralfate (CARAFATE) 1 g tablet Take 1 tablet (1 g total) by mouth 2 (two) times daily. 12/09/17 01/08/18  Darci CurrentBrown, Winfield N, MD    Allergies as of 01/13/2018 - Review Complete 01/13/2018  Allergen Reaction Noted  . Codeine Diarrhea and Nausea And Vomiting 04/30/2015  . Prednisone Other (See Comments) 12/09/2017    Family History  Problem Relation Age of Onset  . Cancer Mother   . AAA (abdominal aortic aneurysm) Father   . Cancer Maternal Grandmother   . Stroke Maternal Grandmother   . Cancer Paternal Grandfather     Social History   Socioeconomic History  . Marital status: Widowed    Spouse name: Not on file  . Number of children: Not on file  . Years of education: Not on file  . Highest  education level: Not on file  Occupational History  . Not on file  Social Needs  . Financial resource strain: Not on file  . Food insecurity:    Worry: Not on file    Inability: Not on file  . Transportation needs:    Medical: Not on file    Non-medical: Not on file  Tobacco Use  . Smoking status: Current Every Day Smoker    Packs/day: 0.10    Types: Cigarettes  . Smokeless tobacco: Never Used  Substance and Sexual Activity  . Alcohol use: No  . Drug use: No  . Sexual activity: Not on file  Lifestyle  . Physical activity:    Days per week: Not on file    Minutes per session: Not on file  . Stress: Not on file  Relationships  . Social connections:    Talks on phone: Not on file    Gets together: Not on file    Attends religious service: Not on file    Active member of club or organization: Not on file    Attends meetings of clubs or organizations: Not on file    Relationship status: Not on file  . Intimate partner violence:    Fear of current or ex partner: Not on file    Emotionally abused: Not on file    Physically abused: Not on file    Forced sexual activity: Not on file  Other Topics Concern  . Not on file  Social History Narrative  . Not on file    Review of Systems: See HPI, otherwise negative ROS  Physical Exam: BP (!) 154/90   Pulse 98   Temp (!) 97.1 F (36.2 C) (Tympanic)   Resp 18   Ht 5\' 2"  (1.575 m)   Wt 107 lb (48.5 kg)   SpO2 99%   BMI 19.57 kg/m  General:   Alert,  pleasant and cooperative in NAD Head:  Normocephalic and atraumatic. Neck:  Supple; no masses or thyromegaly. Lungs:  Clear throughout to auscultation.    Heart:  Regular rate and rhythm. Abdomen:  Soft, nontender and nondistended. Normal bowel sounds, without guarding, and without rebound.   Neurologic:  Alert and  oriented x4;  grossly normal neurologically.  Impression/Plan: Annette Miller is here for an endoscopy and colonoscopy to be performed for PUD and colon cancer  screening  Risks, benefits, limitations, and alternatives regarding  endoscopy and colonoscopy have been reviewed with the patient.  Questions have been answered.  All parties agreeable.   [name], MD  01/14/2018, 8:05 AM

## 2018-01-14 NOTE — Anesthesia Preprocedure Evaluation (Signed)
Anesthesia Evaluation  Patient identified by MRN, date of birth, ID band Patient awake    Reviewed: Allergy & Precautions, NPO status , Patient's Chart, lab work & pertinent test results, reviewed documented beta blocker date and time   Airway Mallampati: II  TM Distance: >3 FB     Dental  (+) Chipped   Pulmonary Current Smoker,           Cardiovascular      Neuro/Psych PSYCHIATRIC DISORDERS Anxiety Depression  Neuromuscular disease    GI/Hepatic PUD,   Endo/Other  Hypothyroidism   Renal/GU Renal disease     Musculoskeletal  (+) Arthritis ,   Abdominal   Peds  Hematology  (+) anemia ,   Anesthesia Other Findings   Reproductive/Obstetrics                             Anesthesia Physical Anesthesia Plan  ASA: III  Anesthesia Plan: General   Post-op Pain Management:    Induction: Intravenous  PONV Risk Score and Plan:   Airway Management Planned:   Additional Equipment:   Intra-op Plan:   Post-operative Plan:   Informed Consent:   Plan Discussed with:   Anesthesia Plan Comments:         Anesthesia Quick Evaluation

## 2018-01-14 NOTE — OR Nursing (Signed)
Upon waking pt reports she needed to use bathroom to void.  Pt walked to bathroom with 2 RN's.  Pt voided.

## 2018-01-14 NOTE — Op Note (Signed)
[facility] Gastroenterology Patient Name: Alline Pio Procedure Date: 01/14/2018 8:09 AM MRN: [id number] Account #: [id number] Date of Birth: [birth year] Admit Type: Outpatient Age: 67 Room: [facility] ENDO ROOM 2 Gender: Female Note Status: Finalized Procedure:            Colonoscopy Indications:          Screening for colorectal malignant neoplasm Providers:            Lin Landsman MD, MD Referring MD:         Guadalupe Maple, MD (Referring MD) Medicines:            Monitored Anesthesia Care Complications:        No immediate complications. Estimated blood loss: None. Procedure:            Pre-Anesthesia Assessment:                       - Prior to the procedure, a History and Physical was                        performed, and patient medications and allergies were                        reviewed. The patient is competent. The risks and                        benefits of the procedure and the sedation options and                        risks were discussed with the patient. All questions                        were answered and informed consent was obtained.                        Patient identification and proposed procedure were                        verified by the physician, the nurse, the                        anesthesiologist, the anesthetist and the technician in                        the pre-procedure area in the procedure room in the                        endoscopy suite. Mental Status Examination: alert and                        oriented. Airway Examination: normal oropharyngeal                        airway and neck mobility. Respiratory Examination:                        clear to auscultation. CV Examination: normal.                        Prophylactic Antibiotics: The patient does not require  prophylactic antibiotics. Prior Anticoagulants: The                        patient has taken no previous anticoagulant or                         antiplatelet agents. ASA Grade Assessment: III - A                        patient with severe systemic disease. After reviewing                        the risks and benefits, the patient was deemed in                        satisfactory condition to undergo the procedure. The                        anesthesia plan was to use monitored anesthesia care                        (MAC). Immediately prior to administration of                        medications, the patient was re-assessed for adequacy                        to receive sedatives. The heart rate, respiratory rate,                        oxygen saturations, blood pressure, adequacy of                        pulmonary ventilation, and response to care were                        monitored throughout the procedure. The physical status                        of the patient was re-assessed after the procedure.                       After obtaining informed consent, the colonoscope was                        passed under direct vision. Throughout the procedure,                        the patient's blood pressure, pulse, and oxygen                        saturations were monitored continuously. The                        Colonoscope was introduced through the anus and                        advanced to the the terminal ileum. The colonoscopy was  extremely difficult due to multiple diverticula in the                        colon and significant looping. Successful completion of                        the procedure was aided by changing the patient to a                        supine position, changing the patient to a prone                        position, using manual pressure and withdrawing the                        scope and replacing with the adult endoscope. The                        patient tolerated the procedure fairly well. The                        quality of the bowel preparation was  poor. Findings:      The perianal and digital rectal examinations were normal. Pertinent       negatives include normal sphincter tone and no palpable rectal lesions.      The terminal ileum appeared normal.      Multiple small and large-mouthed diverticula were found in the sigmoid       colon. There was narrowing of the colon in association with the       diverticular opening. Peri-diverticular erythema was seen. There was no       evidence of diverticular bleeding.      Copious quantities of semi-liquid stool was found in the entire colon,       interfering with visualization. Lavage of the area was performed using       greater than 500 mL of sterile water, resulting in clearance with fair       visualization.      The retroflexed view of the distal rectum and anal verge was normal and       showed no anal or rectal abnormalities. Impression:           - Preparation of the colon was poor.                       - The examined portion of the ileum was normal.                       - Severe diverticulosis in the sigmoid colon. There was                        narrowing of the colon in association with the                        diverticular opening. Peri-diverticular erythema was                        seen. There was no evidence of diverticular bleeding.                       -  Stool in the entire examined colon.                       - The distal rectum and anal verge are normal on                        retroflexion view.                       - No specimens collected. Recommendation:       - Discharge patient to home.                       - Resume previous diet today.                       - Continue present medications.                       - Repeat colonoscopy in 1 year because the bowel                        preparation was suboptimal.                       - Return to my office as previously scheduled.                       - Recommend 2 day prep and using upper scope for  future                        colonoscopies Procedure Code(s):    --- Professional ---                       J0300, Colorectal cancer screening; colonoscopy on                        individual not meeting criteria for high risk Diagnosis Code(s):    --- Professional ---                       Z12.11, Encounter for screening for malignant neoplasm                        of colon                       K57.30, Diverticulosis of large intestine without                        perforation or abscess without bleeding CPT copyright 2017 American [employer] Association. All rights reserved. The codes documented in this report are preliminary and upon coder review may  be revised to meet current compliance requirements. Dr. Ulyess Mort Lin Landsman MD, MD 01/14/2018 9:43:17 AM This report has been signed electronically. Number of Addenda: 0 Note Initiated On: 01/14/2018 8:09 AM Scope Withdrawal Time: 0 hours 11 minutes 25 seconds  Total Procedure Duration: 0 hours 50 minutes 49 seconds       [facility]

## 2018-01-14 NOTE — Transfer of Care (Signed)
Immediate Anesthesia Transfer of Care Note  Patient: Annette Miller  Procedure(s) Performed: COLONOSCOPY WITH PROPOFOL (N/A ) ESOPHAGOGASTRODUODENOSCOPY (EGD) WITH PROPOFOL (N/A )  Patient Location: PACU and Endoscopy Unit  Anesthesia Type:General  Level of Consciousness: drowsy  Airway & Oxygen Therapy: Patient Spontanous Breathing  Post-op Assessment: Report given to RN  Post vital signs: stable  Last Vitals:  Vitals Value Taken Time  BP    Temp    Pulse    Resp    SpO2      Last Pain:  Vitals:   01/14/18 0702  TempSrc: Tympanic         Complications: No apparent anesthesia complications

## 2018-01-15 NOTE — Progress Notes (Signed)
Non-identifying voicemail.  No message left. 

## 2018-01-17 ENCOUNTER — Encounter: Payer: Self-pay | Admitting: Gastroenterology

## 2018-01-17 LAB — SURGICAL PATHOLOGY

## 2018-01-20 ENCOUNTER — Other Ambulatory Visit: Payer: Self-pay | Admitting: Family Medicine

## 2018-01-20 ENCOUNTER — Telehealth: Payer: Self-pay

## 2018-01-20 MED ORDER — ONDANSETRON HCL 4 MG PO TABS: 4.0000 mg | ORAL_TABLET | Freq: Three times a day (TID) | ORAL | 3 refills | Status: DC | PRN

## 2018-01-20 NOTE — Telephone Encounter (Signed)
Copied from CRM [phone]. Topic: Quick Communication - Other Results >> Jan 20, 2018  3:30 PM Laural BenesJohnson, Louisianahiquita C wrote: Pt called in requesting a call back for her results.   CB: [phone]

## 2018-01-20 NOTE — Telephone Encounter (Signed)
Patient was transferred to provider for telephone conversation.   

## 2018-01-20 NOTE — Telephone Encounter (Signed)
Phone call Patient still nauseous reviewed pathology report has ulcer but no H. pylori bacteria patient's been treated with antibiotics.  Otherwise still having some nausea and vomiting will call in some Zofran and observe patient's checking with GI.

## 2018-01-20 NOTE — Telephone Encounter (Signed)
Patient want to discuss results and ongoing symptoms

## 2018-01-26 DIAGNOSIS — Z9289 Personal history of other medical treatment: Secondary | ICD-10-CM

## 2018-01-26 HISTORY — DX: Personal history of other medical treatment: Z92.89

## 2018-01-26 HISTORY — PX: APPENDECTOMY: SHX54

## 2018-02-21 ENCOUNTER — Other Ambulatory Visit: Payer: Self-pay | Admitting: Family Medicine

## 2018-02-24 NOTE — Telephone Encounter (Signed)
Attempted to contact this patient regarding the need for Zofran. No answer, left message that we will try to give her a call back.

## 2018-02-24 NOTE — Telephone Encounter (Signed)
LOV 12/15/2017 with Dr. Dossie Arbour / Patient requesting refill on Zofran / Last written: ondansetron (ZOFRAN) 4 MG tablet 20 tablet 3 01/20/2018    Sig - Route: Take 1 tablet (4 mg total) by mouth every 8 (eight) hours as needed for nausea or vomiting. - Oral    / Attempted to contact patient to see if she is out, or if she has contacted the pharmacy for refills. / Patient has not called back in regards to this.  Routing to office for follow up

## 2018-02-25 ENCOUNTER — Emergency Department: Payer: Medicare HMO | Admitting: Certified Registered"

## 2018-02-25 ENCOUNTER — Inpatient Hospital Stay
Admission: EM | Admit: 2018-02-25 | Discharge: 2018-03-12 | DRG: 853 | Disposition: A | Discharge status: Home Health | Payer: Medicare HMO | Attending: Surgery | Admitting: Surgery

## 2018-02-25 ENCOUNTER — Emergency Department: Payer: Medicare HMO

## 2018-02-25 ENCOUNTER — Other Ambulatory Visit: Payer: Self-pay

## 2018-02-25 ENCOUNTER — Encounter
Admission: EM | Disposition: A | Discharge status: Home Health | Payer: Self-pay | Source: Home / Self Care | Attending: Surgery

## 2018-02-25 DIAGNOSIS — E871 Hypo-osmolality and hyponatremia: Secondary | ICD-10-CM | POA: Diagnosis not present

## 2018-02-25 DIAGNOSIS — K567 Ileus, unspecified: Secondary | ICD-10-CM | POA: Diagnosis not present

## 2018-02-25 DIAGNOSIS — Z888 Allergy status to other drugs, medicaments and biological substances status: Secondary | ICD-10-CM

## 2018-02-25 DIAGNOSIS — Z79899 Other long term (current) drug therapy: Secondary | ICD-10-CM

## 2018-02-25 DIAGNOSIS — Z7951 Long term (current) use of inhaled steroids: Secondary | ICD-10-CM

## 2018-02-25 DIAGNOSIS — Z6820 Body mass index (BMI) 20.0-20.9, adult: Secondary | ICD-10-CM

## 2018-02-25 DIAGNOSIS — E43 Unspecified severe protein-calorie malnutrition: Secondary | ICD-10-CM | POA: Diagnosis present

## 2018-02-25 DIAGNOSIS — B9620 Unspecified Escherichia coli [E. coli] as the cause of diseases classified elsewhere: Secondary | ICD-10-CM | POA: Diagnosis present

## 2018-02-25 DIAGNOSIS — E869 Volume depletion, unspecified: Secondary | ICD-10-CM | POA: Diagnosis not present

## 2018-02-25 DIAGNOSIS — Y95 Nosocomial condition: Secondary | ICD-10-CM | POA: Diagnosis present

## 2018-02-25 DIAGNOSIS — K631 Perforation of intestine (nontraumatic): Secondary | ICD-10-CM

## 2018-02-25 DIAGNOSIS — F329 Major depressive disorder, single episode, unspecified: Secondary | ICD-10-CM | POA: Diagnosis present

## 2018-02-25 DIAGNOSIS — R1030 Lower abdominal pain, unspecified: Secondary | ICD-10-CM | POA: Diagnosis not present

## 2018-02-25 DIAGNOSIS — E039 Hypothyroidism, unspecified: Secondary | ICD-10-CM | POA: Diagnosis present

## 2018-02-25 DIAGNOSIS — R0602 Shortness of breath: Secondary | ICD-10-CM | POA: Diagnosis not present

## 2018-02-25 DIAGNOSIS — R918 Other nonspecific abnormal finding of lung field: Secondary | ICD-10-CM | POA: Diagnosis not present

## 2018-02-25 DIAGNOSIS — F17210 Nicotine dependence, cigarettes, uncomplicated: Secondary | ICD-10-CM | POA: Diagnosis present

## 2018-02-25 DIAGNOSIS — K838 Other specified diseases of biliary tract: Secondary | ICD-10-CM | POA: Diagnosis present

## 2018-02-25 DIAGNOSIS — Z791 Long term (current) use of non-steroidal anti-inflammatories (NSAID): Secondary | ICD-10-CM

## 2018-02-25 DIAGNOSIS — E785 Hyperlipidemia, unspecified: Secondary | ICD-10-CM | POA: Diagnosis present

## 2018-02-25 DIAGNOSIS — K219 Gastro-esophageal reflux disease without esophagitis: Secondary | ICD-10-CM | POA: Diagnosis present

## 2018-02-25 DIAGNOSIS — D62 Acute posthemorrhagic anemia: Secondary | ICD-10-CM | POA: Diagnosis not present

## 2018-02-25 DIAGNOSIS — R0789 Other chest pain: Secondary | ICD-10-CM | POA: Diagnosis not present

## 2018-02-25 DIAGNOSIS — J9690 Respiratory failure, unspecified, unspecified whether with hypoxia or hypercapnia: Secondary | ICD-10-CM | POA: Diagnosis not present

## 2018-02-25 DIAGNOSIS — M1990 Unspecified osteoarthritis, unspecified site: Secondary | ICD-10-CM | POA: Diagnosis present

## 2018-02-25 DIAGNOSIS — R000 Tachycardia, unspecified: Secondary | ICD-10-CM | POA: Diagnosis not present

## 2018-02-25 DIAGNOSIS — J9811 Atelectasis: Secondary | ICD-10-CM | POA: Diagnosis not present

## 2018-02-25 DIAGNOSIS — Z681 Body mass index (BMI) 19 or less, adult: Secondary | ICD-10-CM

## 2018-02-25 DIAGNOSIS — B965 Pseudomonas (aeruginosa) (mallei) (pseudomallei) as the cause of diseases classified elsewhere: Secondary | ICD-10-CM | POA: Diagnosis present

## 2018-02-25 DIAGNOSIS — J810 Acute pulmonary edema: Secondary | ICD-10-CM | POA: Diagnosis not present

## 2018-02-25 DIAGNOSIS — I340 Nonrheumatic mitral (valve) insufficiency: Secondary | ICD-10-CM | POA: Diagnosis not present

## 2018-02-25 DIAGNOSIS — E8770 Fluid overload, unspecified: Secondary | ICD-10-CM | POA: Diagnosis not present

## 2018-02-25 DIAGNOSIS — J9601 Acute respiratory failure with hypoxia: Secondary | ICD-10-CM | POA: Diagnosis not present

## 2018-02-25 DIAGNOSIS — J449 Chronic obstructive pulmonary disease, unspecified: Secondary | ICD-10-CM | POA: Diagnosis present

## 2018-02-25 DIAGNOSIS — J181 Lobar pneumonia, unspecified organism: Secondary | ICD-10-CM | POA: Diagnosis not present

## 2018-02-25 DIAGNOSIS — A419 Sepsis, unspecified organism: Principal | ICD-10-CM | POA: Diagnosis present

## 2018-02-25 DIAGNOSIS — R69 Illness, unspecified: Secondary | ICD-10-CM | POA: Diagnosis not present

## 2018-02-25 DIAGNOSIS — J189 Pneumonia, unspecified organism: Secondary | ICD-10-CM | POA: Diagnosis not present

## 2018-02-25 DIAGNOSIS — K5720 Diverticulitis of large intestine with perforation and abscess without bleeding: Secondary | ICD-10-CM | POA: Diagnosis present

## 2018-02-25 DIAGNOSIS — R109 Unspecified abdominal pain: Secondary | ICD-10-CM | POA: Diagnosis not present

## 2018-02-25 DIAGNOSIS — Z885 Allergy status to narcotic agent status: Secondary | ICD-10-CM

## 2018-02-25 DIAGNOSIS — Z90710 Acquired absence of both cervix and uterus: Secondary | ICD-10-CM | POA: Diagnosis not present

## 2018-02-25 DIAGNOSIS — Z79890 Hormone replacement therapy (postmenopausal): Secondary | ICD-10-CM

## 2018-02-25 DIAGNOSIS — K5780 Diverticulitis of intestine, part unspecified, with perforation and abscess without bleeding: Secondary | ICD-10-CM | POA: Diagnosis not present

## 2018-02-25 HISTORY — PX: LAPAROTOMY: SHX154

## 2018-02-25 LAB — CBC WITH DIFFERENTIAL/PLATELET
BASOS ABS: 0 10*3/uL (ref 0–0.1)
BASOS ABS: 0 10*3/uL (ref 0–0.1)
BASOS PCT: 0 %
BASOS PCT: 0 %
EOS ABS: 0 10*3/uL (ref 0–0.7)
EOS ABS: 0 10*3/uL (ref 0–0.7)
EOS PCT: 0 %
EOS PCT: 0 %
HCT: 30 % — ABNORMAL LOW (ref 35.0–47.0)
HCT: 23.6 % — ABNORMAL LOW (ref 35.0–47.0)
Hemoglobin: 10.4 g/dL — ABNORMAL LOW (ref 12.0–16.0)
Hemoglobin: 8.2 g/dL — ABNORMAL LOW (ref 12.0–16.0)
LYMPHS PCT: 16 %
LYMPHS PCT: 8 %
Lymphs Abs: 1.4 10*3/uL (ref 1.0–3.6)
Lymphs Abs: 0.5 10*3/uL — ABNORMAL LOW (ref 1.0–3.6)
MCH: 33.1 pg (ref 26.0–34.0)
MCH: 33.4 pg (ref 26.0–34.0)
MCHC: 34.8 g/dL (ref 32.0–36.0)
MCHC: 34.6 g/dL (ref 32.0–36.0)
MCV: 95.3 fL (ref 80.0–100.0)
MCV: 96.3 fL (ref 80.0–100.0)
MONO ABS: 0.2 10*3/uL (ref 0.2–0.9)
MONO ABS: 0.2 10*3/uL (ref 0.2–0.9)
Monocytes Relative: 3 %
Monocytes Relative: 3 %
Neutro Abs: 6.9 10*3/uL — ABNORMAL HIGH (ref 1.4–6.5)
Neutro Abs: 5.8 10*3/uL (ref 1.4–6.5)
Neutrophils Relative %: 81 %
Neutrophils Relative %: 89 %
PLATELETS: 1080 10*3/uL — AB (ref 150–440)
PLATELETS: 698 10*3/uL — AB (ref 150–440)
RBC: 3.14 MIL/uL — AB (ref 3.80–5.20)
RBC: 2.45 MIL/uL — ABNORMAL LOW (ref 3.80–5.20)
RDW: 14.3 % (ref 11.5–14.5)
RDW: 14 % (ref 11.5–14.5)
WBC: 8.5 10*3/uL (ref 3.6–11.0)
WBC: 6.5 10*3/uL (ref 3.6–11.0)

## 2018-02-25 LAB — COMPREHENSIVE METABOLIC PANEL
ALT: 10 U/L — AB (ref 14–54)
AST: 35 U/L (ref 15–41)
Albumin: 2.3 g/dL — ABNORMAL LOW (ref 3.5–5.0)
Alkaline Phosphatase: 107 U/L (ref 38–126)
Anion gap: 17 — ABNORMAL HIGH (ref 5–15)
BUN: 18 mg/dL (ref 6–20)
CHLORIDE: 93 mmol/L — AB (ref 101–111)
CO2: 26 mmol/L (ref 22–32)
CREATININE: 0.99 mg/dL (ref 0.44–1.00)
Calcium: 8.9 mg/dL (ref 8.9–10.3)
GFR calc Af Amer: >60 mL/min (ref >60)
GFR calc non Af Amer: 58 mL/min — ABNORMAL LOW (ref >60)
Glucose, Bld: 96 mg/dL (ref 65–99)
POTASSIUM: 3.2 mmol/L — AB (ref 3.5–5.1)
SODIUM: 136 mmol/L (ref 135–145)
Total Bilirubin: 0.5 mg/dL (ref 0.3–1.2)
Total Protein: 7.3 g/dL (ref 6.5–8.1)

## 2018-02-25 LAB — BASIC METABOLIC PANEL
ANION GAP: 10 (ref 5–15)
BUN: 14 mg/dL (ref 6–20)
CHLORIDE: 101 mmol/L (ref 101–111)
CO2: 20 mmol/L — ABNORMAL LOW (ref 22–32)
Calcium: 6.9 mg/dL — ABNORMAL LOW (ref 8.9–10.3)
Creatinine, Ser: 0.53 mg/dL (ref 0.44–1.00)
GFR calc Af Amer: >60 mL/min (ref >60)
Glucose, Bld: 110 mg/dL — ABNORMAL HIGH (ref 65–99)
POTASSIUM: 3.2 mmol/L — AB (ref 3.5–5.1)
SODIUM: 131 mmol/L — AB (ref 135–145)

## 2018-02-25 LAB — LIPASE, BLOOD: LIPASE: 17 U/L (ref 11–51)

## 2018-02-25 LAB — GLUCOSE, CAPILLARY: GLUCOSE-CAPILLARY: 107 mg/dL — AB (ref 65–99)

## 2018-02-25 LAB — MRSA PCR SCREENING: MRSA BY PCR: NEGATIVE

## 2018-02-25 SURGERY — LAPAROTOMY, EXPLORATORY
Anesthesia: General

## 2018-02-25 MED ORDER — LIDOCAINE HCL (PF) 2 % IJ SOLN
INTRAMUSCULAR | Status: AC
  Filled 2018-02-25: qty 10

## 2018-02-25 MED ORDER — IOHEXOL 300 MG/ML  SOLN
75.0000 mL | Freq: Once | INTRAMUSCULAR | Status: AC | PRN
  Administered 2018-02-25: 75 mL via INTRAVENOUS

## 2018-02-25 MED ORDER — PHENYLEPHRINE HCL 10 MG/ML IJ SOLN
INTRAMUSCULAR | Status: AC
  Filled 2018-02-25: qty 1

## 2018-02-25 MED ORDER — BUPIVACAINE LIPOSOME 1.3 % IJ SUSP
INTRAMUSCULAR | Status: DC | PRN
  Administered 2018-02-25: 58 mL

## 2018-02-25 MED ORDER — LACTATED RINGERS IV SOLN
INTRAVENOUS | Status: DC | PRN
  Administered 2018-02-25 (×2): via INTRAVENOUS

## 2018-02-25 MED ORDER — IOPAMIDOL (ISOVUE-300) INJECTION 61%
15.0000 mL | INTRAVENOUS | Status: AC
  Administered 2018-02-25 (×2): 15 mL via ORAL

## 2018-02-25 MED ORDER — ROCURONIUM BROMIDE 100 MG/10ML IV SOLN
INTRAVENOUS | Status: DC | PRN
  Administered 2018-02-25: 30 mg via INTRAVENOUS
  Administered 2018-02-25: 10 mg via INTRAVENOUS

## 2018-02-25 MED ORDER — MORPHINE SULFATE (PF) 2 MG/ML IV SOLN
2.0000 mg | Freq: Once | INTRAVENOUS | Status: AC
  Administered 2018-02-25: 2 mg via INTRAVENOUS
  Filled 2018-02-25: qty 1

## 2018-02-25 MED ORDER — VANCOMYCIN HCL 10 G IV SOLR: 1.0000 g | Freq: Once | INTRAVENOUS | Status: DC

## 2018-02-25 MED ORDER — PROPOFOL 10 MG/ML IV BOLUS
INTRAVENOUS | Status: DC | PRN
  Administered 2018-02-25: 100 mg via INTRAVENOUS

## 2018-02-25 MED ORDER — ONDANSETRON HCL 4 MG/2ML IJ SOLN
4.0000 mg | Freq: Four times a day (QID) | INTRAMUSCULAR | Status: DC | PRN
  Administered 2018-02-25 – 2018-03-11 (×19): 4 mg via INTRAVENOUS
  Filled 2018-02-25 (×20): qty 2

## 2018-02-25 MED ORDER — KETOROLAC TROMETHAMINE 15 MG/ML IJ SOLN: 15.0000 mg | Freq: Four times a day (QID) | INTRAMUSCULAR | Status: DC

## 2018-02-25 MED ORDER — ONDANSETRON HCL 4 MG/2ML IJ SOLN: 4.0000 mg | Freq: Once | INTRAMUSCULAR | Status: DC | PRN

## 2018-02-25 MED ORDER — ONDANSETRON 4 MG PO TBDP
4.0000 mg | ORAL_TABLET | Freq: Four times a day (QID) | ORAL | Status: DC | PRN
  Administered 2018-03-01 – 2018-03-09 (×4): 4 mg via ORAL
  Filled 2018-02-25 (×5): qty 1

## 2018-02-25 MED ORDER — DEXAMETHASONE SODIUM PHOSPHATE 10 MG/ML IJ SOLN
INTRAMUSCULAR | Status: AC
  Filled 2018-02-25: qty 1

## 2018-02-25 MED ORDER — MORPHINE SULFATE (PF) 2 MG/ML IV SOLN
2.0000 mg | INTRAVENOUS | Status: DC | PRN
  Administered 2018-02-25 – 2018-02-27 (×6): 2 mg via INTRAVENOUS
  Filled 2018-02-25 (×6): qty 1

## 2018-02-25 MED ORDER — ONDANSETRON HCL 4 MG/2ML IJ SOLN
INTRAMUSCULAR | Status: DC | PRN
  Administered 2018-02-25: 4 mg via INTRAVENOUS

## 2018-02-25 MED ORDER — EPHEDRINE SULFATE 50 MG/ML IJ SOLN
INTRAMUSCULAR | Status: DC | PRN
Start: 2018-02-25 — End: 2018-02-25
  Administered 2018-02-25 (×2): 10 mg via INTRAVENOUS
  Administered 2018-02-25: 5 mg via INTRAVENOUS
  Administered 2018-02-25: 10 mg via INTRAVENOUS

## 2018-02-25 MED ORDER — VANCOMYCIN HCL IN DEXTROSE 1-5 GM/200ML-% IV SOLN
1000.0000 mg | Freq: Once | INTRAVENOUS | Status: AC
Start: 2018-02-25 — End: 2018-02-25
  Administered 2018-02-25: 1000 mg via INTRAVENOUS

## 2018-02-25 MED ORDER — KCL IN DEXTROSE-NACL 20-5-0.45 MEQ/L-%-% IV SOLN
INTRAVENOUS | Status: DC
  Administered 2018-02-25 – 2018-02-28 (×9): via INTRAVENOUS
  Filled 2018-02-25 (×11): qty 1000

## 2018-02-25 MED ORDER — FAMOTIDINE IN NACL 20-0.9 MG/50ML-% IV SOLN
20.0000 mg | INTRAVENOUS | Status: DC
  Administered 2018-02-25 – 2018-02-26 (×2): 20 mg via INTRAVENOUS
  Filled 2018-02-25 (×2): qty 50

## 2018-02-25 MED ORDER — ROCURONIUM BROMIDE 50 MG/5ML IV SOLN
INTRAVENOUS | Status: AC
  Filled 2018-02-25: qty 1

## 2018-02-25 MED ORDER — FENTANYL CITRATE (PF) 100 MCG/2ML IJ SOLN
INTRAMUSCULAR | Status: AC
  Filled 2018-02-25: qty 2

## 2018-02-25 MED ORDER — BUPIVACAINE-EPINEPHRINE (PF) 0.25% -1:200000 IJ SOLN
INTRAMUSCULAR | Status: AC
  Filled 2018-02-25: qty 30

## 2018-02-25 MED ORDER — MORPHINE SULFATE (PF) 4 MG/ML IV SOLN
2.0000 mg | INTRAVENOUS | Status: DC | PRN
  Administered 2018-02-25: 2 mg via INTRAVENOUS
  Filled 2018-02-25: qty 1

## 2018-02-25 MED ORDER — KETAMINE HCL 50 MG/ML IJ SOLN
INTRAMUSCULAR | Status: AC
  Filled 2018-02-25: qty 10

## 2018-02-25 MED ORDER — LEVOTHYROXINE SODIUM 50 MCG PO TABS
25.0000 ug | ORAL_TABLET | Freq: Every day | ORAL | Status: DC
  Administered 2018-02-26 – 2018-03-12 (×13): 25 ug via ORAL
  Filled 2018-02-25 (×14): qty 1

## 2018-02-25 MED ORDER — PROPOFOL 10 MG/ML IV BOLUS
INTRAVENOUS | Status: AC
Start: 2018-02-25 — End: 2018-02-25
  Filled 2018-02-25: qty 20

## 2018-02-25 MED ORDER — PHENYLEPHRINE HCL 10 MG/ML IJ SOLN
INTRAMUSCULAR | Status: DC | PRN
  Administered 2018-02-25 (×2): 100 ug via INTRAVENOUS
  Administered 2018-02-25: 200 ug via INTRAVENOUS
  Administered 2018-02-25: 100 ug via INTRAVENOUS
  Administered 2018-02-25: 200 ug via INTRAVENOUS

## 2018-02-25 MED ORDER — ACETAMINOPHEN 500 MG PO TABS: 1000.0000 mg | ORAL_TABLET | Freq: Three times a day (TID) | ORAL | Status: DC

## 2018-02-25 MED ORDER — LACTATED RINGERS IV BOLUS
1000.0000 mL | Freq: Once | INTRAVENOUS | Status: AC
  Administered 2018-02-25: 1000 mL via INTRAVENOUS

## 2018-02-25 MED ORDER — MIDAZOLAM HCL 2 MG/2ML IJ SOLN
INTRAMUSCULAR | Status: DC | PRN
  Administered 2018-02-25: 2 mg via INTRAVENOUS

## 2018-02-25 MED ORDER — ACETAMINOPHEN 10 MG/ML IV SOLN
INTRAVENOUS | Status: DC | PRN
  Administered 2018-02-25: 1000 mg via INTRAVENOUS

## 2018-02-25 MED ORDER — PIPERACILLIN-TAZOBACTAM 3.375 G IVPB
3.3750 g | Freq: Three times a day (TID) | INTRAVENOUS | Status: DC
  Administered 2018-02-25 – 2018-03-02 (×15): 3.375 g via INTRAVENOUS
  Filled 2018-02-25 (×14): qty 50

## 2018-02-25 MED ORDER — FENTANYL CITRATE (PF) 100 MCG/2ML IJ SOLN: 25.0000 ug | INTRAMUSCULAR | Status: DC | PRN

## 2018-02-25 MED ORDER — SODIUM CHLORIDE 0.9 % IV BOLUS
1000.0000 mL | Freq: Once | INTRAVENOUS | Status: AC
  Administered 2018-02-25: 1000 mL via INTRAVENOUS

## 2018-02-25 MED ORDER — PHENYLEPHRINE HCL 10 MG/ML IJ SOLN
INTRAMUSCULAR | Status: DC | PRN
  Administered 2018-02-25: 50 ug/min via INTRAVENOUS

## 2018-02-25 MED ORDER — ENOXAPARIN SODIUM 40 MG/0.4ML ~~LOC~~ SOLN: 40.0000 mg | SUBCUTANEOUS | Status: DC

## 2018-02-25 MED ORDER — KETOROLAC TROMETHAMINE 15 MG/ML IJ SOLN
15.0000 mg | Freq: Four times a day (QID) | INTRAMUSCULAR | Status: DC | PRN
  Administered 2018-02-26 – 2018-02-27 (×5): 15 mg via INTRAVENOUS
  Filled 2018-02-25 (×6): qty 1

## 2018-02-25 MED ORDER — LIDOCAINE HCL (CARDIAC) PF 100 MG/5ML IV SOSY
PREFILLED_SYRINGE | INTRAVENOUS | Status: DC | PRN
  Administered 2018-02-25: 50 mg via INTRAVENOUS

## 2018-02-25 MED ORDER — GLYCOPYRROLATE 0.2 MG/ML IJ SOLN
INTRAMUSCULAR | Status: AC
  Filled 2018-02-25: qty 1

## 2018-02-25 MED ORDER — PIPERACILLIN-TAZOBACTAM 3.375 G IVPB 30 MIN
3.3750 g | Freq: Once | INTRAVENOUS | Status: AC
  Administered 2018-02-25: 3.375 g via INTRAVENOUS
  Filled 2018-02-25: qty 50

## 2018-02-25 MED ORDER — HEPARIN SODIUM (PORCINE) 1000 UNIT/ML IJ SOLN
INTRAMUSCULAR | Status: AC
  Filled 2018-02-25: qty 1

## 2018-02-25 MED ORDER — SUCCINYLCHOLINE CHLORIDE 20 MG/ML IJ SOLN
INTRAMUSCULAR | Status: DC | PRN
  Administered 2018-02-25: 80 mg via INTRAVENOUS

## 2018-02-25 MED ORDER — SODIUM CHLORIDE 0.9 % IJ SOLN
INTRAMUSCULAR | Status: AC
Start: 2018-02-25 — End: 2018-02-25
  Filled 2018-02-25: qty 50

## 2018-02-25 MED ORDER — SUCCINYLCHOLINE CHLORIDE 20 MG/ML IJ SOLN
INTRAMUSCULAR | Status: AC
  Filled 2018-02-25: qty 1

## 2018-02-25 MED ORDER — ONDANSETRON HCL 4 MG/2ML IJ SOLN
INTRAMUSCULAR | Status: AC
  Filled 2018-02-25: qty 2

## 2018-02-25 MED ORDER — VASOPRESSIN 20 UNIT/ML IV SOLN
INTRAVENOUS | Status: DC | PRN
  Administered 2018-02-25: 2 [IU] via INTRAVENOUS

## 2018-02-25 MED ORDER — SUGAMMADEX SODIUM 200 MG/2ML IV SOLN
INTRAVENOUS | Status: DC | PRN
  Administered 2018-02-25: 100 mg via INTRAVENOUS

## 2018-02-25 MED ORDER — SUGAMMADEX SODIUM 200 MG/2ML IV SOLN
INTRAVENOUS | Status: AC
Start: 2018-02-25 — End: ?
  Filled 2018-02-25: qty 2

## 2018-02-25 MED ORDER — KETAMINE HCL 50 MG/ML IJ SOLN
INTRAMUSCULAR | Status: DC | PRN
  Administered 2018-02-25: 25 mg via INTRAMUSCULAR
  Administered 2018-02-25: 25 mg via INTRAVENOUS

## 2018-02-25 MED ORDER — FENTANYL CITRATE (PF) 100 MCG/2ML IJ SOLN
INTRAMUSCULAR | Status: DC | PRN
  Administered 2018-02-25 (×2): 50 ug via INTRAVENOUS

## 2018-02-25 MED ORDER — VANCOMYCIN HCL IN DEXTROSE 1-5 GM/200ML-% IV SOLN
INTRAVENOUS | Status: AC
  Administered 2018-02-25: 1000 mg via INTRAVENOUS
  Filled 2018-02-25: qty 200

## 2018-02-25 MED ORDER — ONDANSETRON HCL 4 MG/2ML IJ SOLN
4.0000 mg | Freq: Once | INTRAMUSCULAR | Status: AC
Start: 2018-02-25 — End: 2018-02-25
  Administered 2018-02-25: 4 mg via INTRAVENOUS
  Filled 2018-02-25: qty 2

## 2018-02-25 MED ORDER — BUPIVACAINE-EPINEPHRINE (PF) 0.25% -1:200000 IJ SOLN
INTRAMUSCULAR | Status: DC | PRN
  Administered 2018-02-25: 20 mL via PERINEURAL

## 2018-02-25 MED ORDER — ENOXAPARIN SODIUM 40 MG/0.4ML ~~LOC~~ SOLN
40.0000 mg | SUBCUTANEOUS | Status: DC
  Administered 2018-02-25 – 2018-03-11 (×15): 40 mg via SUBCUTANEOUS
  Filled 2018-02-25 (×15): qty 0.4

## 2018-02-25 MED ORDER — LACTATED RINGERS IV SOLN
INTRAVENOUS | Status: DC | PRN
  Administered 2018-02-25 (×2): via INTRAVENOUS

## 2018-02-25 MED ORDER — ACETAMINOPHEN 10 MG/ML IV SOLN
INTRAVENOUS | Status: AC
Start: 2018-02-25 — End: ?
  Filled 2018-02-25: qty 100

## 2018-02-25 MED ORDER — DEXAMETHASONE SODIUM PHOSPHATE 10 MG/ML IJ SOLN
INTRAMUSCULAR | Status: DC | PRN
  Administered 2018-02-25: 5 mg via INTRAVENOUS

## 2018-02-25 MED ORDER — MIDAZOLAM HCL 2 MG/2ML IJ SOLN
INTRAMUSCULAR | Status: AC
  Filled 2018-02-25: qty 2

## 2018-02-25 MED ORDER — EPHEDRINE SULFATE 50 MG/ML IJ SOLN
INTRAMUSCULAR | Status: AC
  Filled 2018-02-25: qty 1

## 2018-02-25 MED ORDER — GI COCKTAIL ~~LOC~~
30.0000 mL | Freq: Once | ORAL | Status: AC
  Administered 2018-02-25: 30 mL via ORAL
  Filled 2018-02-25: qty 30

## 2018-02-25 MED ORDER — MORPHINE SULFATE (PF) 4 MG/ML IV SOLN
4.0000 mg | INTRAVENOUS | Status: DC | PRN
  Administered 2018-02-26 (×3): 4 mg via INTRAVENOUS
  Administered 2018-02-26: 2 mg via INTRAVENOUS
  Filled 2018-02-25 (×4): qty 1

## 2018-02-25 MED ORDER — BUPIVACAINE LIPOSOME 1.3 % IJ SUSP
INTRAMUSCULAR | Status: AC
  Filled 2018-02-25: qty 20

## 2018-02-25 SURGICAL SUPPLY — 65 items
APPLIER CLIP 11 MED OPEN (CLIP)
APPLIER CLIP 13 LRG OPEN (CLIP)
APR CLP LRG 13 20 CLIP (CLIP)
APR CLP MED 11 20 MLT OPN (CLIP)
BARRIER SKIN 2 3/4 (OSTOMY) ×2 IMPLANT
BARRIER SKIN OD2.25 2 3/4 FLNG (OSTOMY) IMPLANT
BLADE CLIPPER SURG (BLADE) ×1 IMPLANT
BLADE SURG 15 STRL LF DISP TIS (BLADE) ×1 IMPLANT
BLADE SURG 15 STRL SS (BLADE) ×2
BNDG TENSOPLAST 6X5 (GAUZE/BANDAGES/DRESSINGS) ×1 IMPLANT
BRR SKN FLT 2.75X2.25 2 PC (OSTOMY) ×1
BULB RESERV EVAC DRAIN JP 100C (MISCELLANEOUS) ×2 IMPLANT
CANISTER SUCT 3000ML PPV (MISCELLANEOUS) ×2 IMPLANT
CHLORAPREP W/TINT 26ML (MISCELLANEOUS) ×2 IMPLANT
CLIP APPLIE 11 MED OPEN (CLIP) ×1 IMPLANT
CLIP APPLIE 13 LRG OPEN (CLIP) ×1 IMPLANT
DRAIN CHANNEL JP 19F (MISCELLANEOUS) ×2 IMPLANT
DRAPE LAPAROTOMY 100X77 ABD (DRAPES) ×2 IMPLANT
DRAPE TABLE BACK 80X90 (DRAPES) ×2 IMPLANT
DRSG TEGADERM 2-3/8X2-3/4 SM (GAUZE/BANDAGES/DRESSINGS) ×2 IMPLANT
DRSG TELFA 3X8 NADH (GAUZE/BANDAGES/DRESSINGS) IMPLANT
ELECT BLADE 6.5 EXT (BLADE) ×2 IMPLANT
ELECT CAUTERY BLADE 6.4 (BLADE) ×1 IMPLANT
ELECT REM PT RETURN 9FT ADLT (ELECTROSURGICAL) ×2
ELECTRODE REM PT RTRN 9FT ADLT (ELECTROSURGICAL) ×1 IMPLANT
GAUZE SPONGE 4X4 12PLY STRL (GAUZE/BANDAGES/DRESSINGS) ×3 IMPLANT
GLOVE BIO SURGEON STRL SZ7 (GLOVE) ×3 IMPLANT
GOWN STRL REUS W/ TWL LRG LVL3 (GOWN DISPOSABLE) ×2 IMPLANT
GOWN STRL REUS W/TWL LRG LVL3 (GOWN DISPOSABLE) ×16
HANDLE SUCTION POOLE (INSTRUMENTS) ×1 IMPLANT
HANDLE YANKAUER SUCT BULB TIP (MISCELLANEOUS) ×1 IMPLANT
LIGASURE IMPACT 36 18CM CVD LR (INSTRUMENTS) ×1 IMPLANT
NDL HYPO 25X1 1.5 SAFETY (NEEDLE) ×1 IMPLANT
NEEDLE HYPO 22GX1.5 SAFETY (NEEDLE) ×4 IMPLANT
NEEDLE HYPO 25X1 1.5 SAFETY (NEEDLE) ×2 IMPLANT
PACK BASIN MAJOR ARMC (MISCELLANEOUS) ×2 IMPLANT
PAD DRESSING TELFA 3X8 NADH (GAUZE/BANDAGES/DRESSINGS) ×1 IMPLANT
POUCH DRAIN 2 3/4 LARGE BLUE 1 (OSTOMY) ×1 IMPLANT
RELOAD PROXIMATE 75MM BLUE (ENDOMECHANICALS) IMPLANT
RELOAD STAPLE 75 3.8 BLU REG (ENDOMECHANICALS) IMPLANT
RETAINER VISCERA MED (MISCELLANEOUS) ×1 IMPLANT
SPONGE DRAIN TRACH 4X4 STRL 2S (GAUZE/BANDAGES/DRESSINGS) ×1 IMPLANT
SPONGE LAP 18X18 RF (DISPOSABLE) ×1 IMPLANT
SPONGE LAP 18X36 RFD (DISPOSABLE) ×1 IMPLANT
STAPLER CUT CVD 40MM BLUE (STAPLE) ×1 IMPLANT
STAPLER CUT RELOAD BLUE (STAPLE) ×3 IMPLANT
STAPLER PROXIMATE 75MM BLUE (STAPLE) IMPLANT
STAPLER SKIN PROX 35W (STAPLE) ×2 IMPLANT
SUCTION POOLE HANDLE (INSTRUMENTS)
SUT PDS AB 1 TP1 96 (SUTURE) ×5 IMPLANT
SUT PROLENE 2 0 SH DA (SUTURE) ×1 IMPLANT
SUT SILK 2 0 (SUTURE)
SUT SILK 2 0 SH CR/8 (SUTURE) ×2 IMPLANT
SUT SILK 2 0SH CR/8 30 (SUTURE) ×1 IMPLANT
SUT SILK 2-0 18XBRD TIE 12 (SUTURE) ×1 IMPLANT
SUT VIC AB 0 CT1 36 (SUTURE) ×2 IMPLANT
SUT VIC AB 2-0 SH 27 (SUTURE)
SUT VIC AB 2-0 SH 27XBRD (SUTURE) ×2 IMPLANT
SUT VIC AB 3-0 SH 27 (SUTURE) ×8
SUT VIC AB 3-0 SH 27X BRD (SUTURE) IMPLANT
SWAB CULTURE AMIES ANAERIB BLU (MISCELLANEOUS) ×1 IMPLANT
SYR 30ML LL (SYRINGE) ×4 IMPLANT
SYR 3ML LL SCALE MARK (SYRINGE) ×1 IMPLANT
TAPE MICROFOAM 4IN (TAPE) ×1 IMPLANT
TRAY FOLEY MTR SLVR 16FR STAT (SET/KITS/TRAYS/PACK) ×2 IMPLANT

## 2018-02-25 NOTE — Op Note (Signed)
02/25/2018  2:37 PM  PATIENT:  Annette Miller  67 y.o. female  PRE-OPERATIVE DIAGNOSIS:  Bowel perforation  POST-OPERATIVE DIAGNOSIS:  Perforated Diverticulitis  PROCEDURE:  EXPLORATORY LAPAROTOMY, HARTMANN SIGMOIDECTOMY AND COLOSTOMY, MOBILIZATION OF SPLENIC FLEXURE, APPENDECTOMY  INDICATIONS: This [age] year-old female presented to the ED with a several day history of worsening abdominal pain associated with nausea and vomiting. CT scan revealed free air in the abdominal cavity, although location of perforation was not clear on CT. The patient was peritonitic and tachycardic. The decision was made to take her to the OR for exploratory laparotomy.  SURGEON:  Surgeon(s) and Role:    * Kaplin, Aviva W, DO - Primary    Pabon, Diego, MD  FINDINGS: Perforated sigmoid colon with significant inflammation and gross spillage of stool  DESCRIPTION OF PROCEDURE: The procedure along with the risks, benefits, and alternatives were explained to the patient and her son who expressed understanding. The patient's questions were sought and answered.  The patient was brought to the OR, placed supine on the operating table, and general endotracheal anesthesia was induced. A time-out was completed verifying the correct patient and procedure. Preoperative antibiotics were administered. A Foley catheter and nasogastric tube were placed. The abdomen was prepped and draped in usual sterile fashion. A vertical midline incision was made from several finger breaths below the xyphoid to the umbilicus. This was deepened through subcutaneous tissues and hemostasis was achieved with electrocautery. The peritoneum was elevated and incised and the peritoneal cavity entered. The upper abdomen was explored. Turbid fluid was encountered, cultured and suctioned. The stomach appeared normal with no evidence of perforation or inflammation.  The midline incision was then extended down to the pubis. The abdomen was explored. Adhesions  were lysed with a combination of finger fracture, sharp dissection, and electrocautery. There was found to be extensive inflammation of the sigmoid colon with a large sigmoid perforation. There was gross spillage of formed stool from the perforation. The small bowel was retracted to the right using lap sponges. Using electrocautery and Ligasure, the colon was freed from its peritoneal attachments down to the pelvic inlet. The left ureter was identified and protected. The colon was divided distally with a curved cutting stapler at an area that was soft and without obvious inflammation. The rectal stump was tagged with prolene suture. The line of Toldt was divided to the splenic flexure. Omentum was freed from the transverse colon and the splenic flexure was fully mobilized. The bowel was transected proximally using a curved cutting stapler. The mesentery of the specimen was divided using Ligasure. The specimen was removed and sent to pathology.   The proximal colon reached easily to the proposed colostomy site without tension. A disk of skin was removed from the colostomy site in the left lower quadrant. The incision was deepened through all layers of the abdominal wall and dilated to admit two finders. The colon was passed out through the ostomy site without torsion or tension. The distal-most portion appeared dusky and was resected to pink, healthy-appearing colon. The small bowel was inspected in it's entirety and found to be normal. The appendix was visualized and appeared normal. The mesoappendix was divided using Ligasure and the appendix was resected using a curved cutting stapler. The upper abdomen was again inspected. The lesser sac was entered and inspected and no abnormalities were found. The abdomen was copiously irrigated with warm saline.  Two closed suction (Blake) drains were placed in the pelvis and brought out through separate stab  wounds lateral to the incision. These were secured with silk  suture. The fascia was closed with a running suture of 0 PDS. The skin was left open to heal by secondary intention due to the large amount of fecal contamination and high risk of wound infection. It was dressed with moist kerlex and covered with gauze 4x4's and ABD pads.  The colostomy was matured with multiple interrupted sutures of 3-0 Vicryl. An ostomy bag was applied.  All instrument, needle, and sponge counts were correct at the conclusion of the procedure. The patient tolerated the procedure well and was taken to the PACU in stable condition.

## 2018-02-25 NOTE — Brief Op Note (Signed)
02/25/2018  2:32 PM  PATIENT:  Annette Miller  67 y.o. female  PRE-OPERATIVE DIAGNOSIS:  Bowel perforation  POST-OPERATIVE DIAGNOSIS:  Perforated Diverticulitis  PROCEDURE:  EXPLORATORY LAPAROTOMY, HARTMANN PROCEDURE, APPENDECTOMY, COLOSTOMY  SURGEON:  Surgeon(s) and Role:    * Kaplin, Aviva W, DO - Primary    Pabon, Diego, MD  ASSISTANTS: OR Staff  ANESTHESIA:   General endotrachial  EBL:  150 mL   BLOOD ADMINISTERED: None  DRAINS: Blake x2 in pelvis  LOCAL MEDICATIONS USED:  Exparel  SPECIMEN:  Sigmoid colon, Appendix  DISPOSITION OF SPECIMEN:  Pathology  COUNTS:  Correct  PATIENT DISPOSITION:  Recovery room

## 2018-02-25 NOTE — Transfer of Care (Signed)
Immediate Anesthesia Transfer of Care Note  Patient: Annette Miller  Procedure(s) Performed: EXPLORATORY LAPAROTOMY, PARTMAN'S PROCEDURE, APPENDECTOMY, COLOSTOMY (N/A )  Patient Location: PACU  Anesthesia Type:General  Level of Consciousness: awake  Airway & Oxygen Therapy: Patient Spontanous Breathing and Patient connected to face mask oxygen  Post-op Assessment: Report given to RN and Post -op Vital signs reviewed and stable  Post vital signs: Reviewed  Last Vitals:  Vitals Value Taken Time  BP 126/69 02/25/2018 12:05 PM  Temp    Pulse 104 02/25/2018 12:07 PM  Resp 22 02/25/2018 12:08 PM  SpO2 100 % 02/25/2018 12:07 PM  Vitals shown include unvalidated device data.  Last Pain:  Vitals:   02/25/18 0822  TempSrc:   PainSc: 7          Complications: No apparent anesthesia complications

## 2018-02-25 NOTE — ED Triage Notes (Signed)
Pt arrived from home via EMS with complaints of abdominal pain and tenderness. Pt reports that her abdominal feels "hot to touch". Pt has reported that she has loss a total of 36lbs in the past month. VS per EMS BP-109/64 O2sat-98%RA HR-110/120. Pt alert and oriented x 4.

## 2018-02-25 NOTE — ED Notes (Signed)
Date and time results received: 02/25/18 0429 (use smartphrase ".now" to insert current time)  Test: Platelet Critical Value: 1,080  Name of Provider Notified: Dr. Manson PasseyBrown  Orders Received? Or Actions Taken?:

## 2018-02-25 NOTE — Progress Notes (Signed)
Patient asking for ice chips and Dr. Barton Dubois gave order that patient may have a few ice chips at a time.

## 2018-02-25 NOTE — H&P (Signed)
Patient ID: Annette Miller, female   DOB: [birth year], 67 y.o.   MRN: [id number]  HPI Annette Miller is a 67 y.o. female seen in the emergency room for acute onset of abdominal pain.  She reports pain started about 3 days it severe diffusely.  She did have some nausea as well as vomiting.  No fevers chills.  The pain is sharp, does not radiate since is diffusely.  And its worsened with movement and Valsalva. CT scan personally reviewed showing evidence of scattered free air throughout the abdomen.  There is some fluid and there is also thickening in the sigmoid colon as well as the antrum duodenum. Also had a dilation of the common bile duct up to 10 mm.. Her laboratory data includes an albumin of 2.3 normal LFTs normal white count.  Platelet count of 1 million. He does have a history of COPD and arthritis and takes daily Mobic. He recently had an EGD and colonoscopy, (I have also personally review the images) by GI last month showing evidence of duodenal ulcer gastritis and diverticulosis.  HPI  Past Medical History:  Diagnosis Date  . Allergy   . Anemia   . Arthritis   . Depression   . Goiter   . Hyperlipidemia   . Hypothyroidism   . Osteoporosis     Past Surgical History:  Procedure Laterality Date  . ABDOMINAL HYSTERECTOMY    . COLONOSCOPY WITH PROPOFOL N/A 01/14/2018   Procedure: COLONOSCOPY WITH PROPOFOL;  Surgeon: Toney Reil, MD;  Location: [facility] ENDOSCOPY;  Service: Gastroenterology;  Laterality: N/A;  . ESOPHAGOGASTRODUODENOSCOPY (EGD) WITH PROPOFOL N/A 01/14/2018   Procedure: ESOPHAGOGASTRODUODENOSCOPY (EGD) WITH PROPOFOL;  Surgeon: Toney Reil, MD;  Location: [facility] ENDOSCOPY;  Service: Gastroenterology;  Laterality: N/A;  . TONSILLECTOMY      Family History  Problem Relation Age of Onset  . Cancer Mother   . AAA (abdominal aortic aneurysm) Father   . Cancer Maternal Grandmother   . Stroke Maternal Grandmother   . Cancer Paternal Grandfather     Social  History Social History   Tobacco Use  . Smoking status: Current Every Day Smoker    Packs/day: 0.10    Types: Cigarettes  . Smokeless tobacco: Never Used  Substance Use Topics  . Alcohol use: No  . Drug use: No    Allergies  Allergen Reactions  . Codeine Diarrhea and Nausea And Vomiting  . Prednisone Other (See Comments)    Reaction: violence    Current Facility-Administered Medications  Medication Dose Route Frequency Provider Last Rate Last Dose  . vancomycin (VANCOCIN) IVPB 1000 mg/200 mL premix  1,000 mg Intravenous Once Darci Current, MD 200 mL/hr at 02/25/18 0714 1,000 mg at 02/25/18 4098   Current Outpatient Medications  Medication Sig Dispense Refill  . acetaminophen (TYLENOL) 325 MG tablet Take 650 mg by mouth every 6 (six) hours as needed for mild pain or fever.    . Ascorbic Acid (VITAMIN C) 1000 MG tablet Take 3,000 mg by mouth daily.    Marland Kitchen buPROPion (WELLBUTRIN SR) 150 MG 12 hr tablet TAKE 1 TABLET (150 MG TOTAL) BY MOUTH 2 (TWO) TIMES DAILY. (Patient taking differently: Take 150 mg by mouth daily. ) 180 tablet 3  . Calcium Carb-Cholecalciferol (OYSTER SHELL CALCIUM PLUS D PO) Take 1 tablet by mouth daily.     . diazepam (VALIUM) 5 MG tablet TAKE 1 TABLET BY MOUTH TWICE A DAY 60 tablet 5  . fluticasone (FLONASE) 50 MCG/ACT nasal  spray Place 2 sprays into both nostrils daily. (Patient not taking: Reported on 01/14/2018) 16 g 12  . levothyroxine (SYNTHROID, LEVOTHROID) 25 MCG tablet Take 1 tablet (25 mcg total) by mouth daily. 90 tablet 4  . loratadine (CLARITIN) 10 MG tablet Take 1 tablet (10 mg total) by mouth daily. 30 tablet 11  . magnesium 30 MG tablet Take 30 mg by mouth daily.     . meloxicam (MOBIC) 15 MG tablet Take 1 tablet (15 mg total) by mouth daily. 30 tablet 6  . ondansetron (ZOFRAN ODT) 4 MG disintegrating tablet Take 1 tablet (4 mg total) by mouth every 8 (eight) hours as needed for nausea or vomiting. 20 tablet 0  . ondansetron (ZOFRAN) 4 MG tablet  Take 1 tablet (4 mg total) by mouth every 8 (eight) hours as needed for nausea or vomiting. 20 tablet 3  . pantoprazole (PROTONIX) 40 MG tablet Take 1 tablet (40 mg total) by mouth 2 (two) times daily. 60 tablet 2  . potassium chloride (K-DUR) 10 MEQ tablet Take 4 tablets (40 mEq total) by mouth daily for 2 days. 8 tablet 0  . rosuvastatin (CRESTOR) 10 MG tablet Take 1 tablet (10 mg total) by mouth daily. 90 tablet 4  . Selenium 200 MCG CAPS Take by mouth daily.    . sucralfate (CARAFATE) 1 g tablet Take 1 tablet (1 g total) by mouth 2 (two) times daily. 60 tablet 1  . vitamin E 1000 UNIT capsule Take 1,000 Units by mouth 2 (two) times daily.    . Zinc Sulfate (ZINC 15 PO) Take by mouth daily.       Review of Systems Full ROS  was asked and was negative except for the information on the HPI  Physical Exam Blood pressure 124/65, pulse (!) 111, temperature 97.8 F (36.6 C), temperature source Oral, resp. rate (!) 30, height  (1.575 m), weight 45.4 kg (100 lb), SpO2 91 %. CONSTITUTIONAL: Debilitated female in significant pain EYES: Pupils are equal, round, and reactive to light, Sclera are non-icteric. EARS, NOSE, MOUTH AND THROAT: The oropharynx is clear. The oral mucosa is pink and moist. Hearing is intact to voice. LYMPH NODES:  Lymph nodes in the neck are normal. RESPIRATORY:  Lungs are clear. There is normal respiratory effort, with equal breath sounds bilaterally, and without pathologic use of accessory muscles. CARDIOVASCULAR: Heart is regular without murmurs, gallops, or rubs.  He is tachycardic GI: The abdomen is mildly distended.  Decreased bowel sounds.  She does have rebound tenderness and obvious peritoneal signs.  She is diffusely tender GU: Rectal deferred.   MUSCULOSKELETAL: Normal muscle strength and tone. No cyanosis or edema.   SKIN: Turgor is good and there are no pathologic skin lesions or ulcers. NEUROLOGIC: Motor and sensation is grossly normal. Cranial nerves are  grossly intact. PSYCH:  Oriented to person, place and time. Affect is normal.  Data Reviewed  I have personally reviewed the patient's imaging, laboratory findings and medical records.    Assessment/Plan 67 year old female with a history of COPD arthritis and NSAIDs use presents with an acute abdomen and peritoneal signs. CT scan unable to definitively identify the area of perforation.  Given that she has peritoneal signs, she is tachycardic and with the thrombocytosis I will definitively recommend emergency laparotomy.  Discussed with the patient in detail about the procedure.  Risk benefit and possible complications including but not limited to: Bleeding, injury to adjacent organs, chronic pain, need for colostomy, need for ventilat  letter of support and ICU care.  Also risk include death and major cardiovascular events.  The benefits of the surgery however significantly outweigh the potential risk leaving the acuity of her illness.. Have already started broad-spectrum antibiotics, fluid resuscitation and I have consented her for a laparotomy.  Dr. Barton Dubois and I will be performing the surgery. Extensive counseling provided to the patient and to the family Discussed in detail with anesthesiologist, ER physician and or staff   Sterling Big, MD FACS General Surgeon 02/25/2018, 7:39 AM

## 2018-02-25 NOTE — Anesthesia Procedure Notes (Cosign Needed)
Arterial Line Insertion Performed by: Berdine Addison, MD, Mathews Argyle, CRNA, CRNA  Patient location: OR. Preanesthetic checklist: patient identified, IV checked, site marked, risks and benefits discussed, surgical consent, monitors and equipment checked, pre-op evaluation, timeout performed and anesthesia consent Patient sedated Left, radial was placed Catheter size: 20 Fr Hand hygiene performed , maximum sterile barriers used  and Seldinger technique used Allen's test indicative of satisfactory collateral circulation Attempts: 1 Procedure performed without using ultrasound guided technique. Following insertion, dressing applied. Post procedure assessment: normal and unchanged  Patient tolerated the procedure well with no immediate complications.

## 2018-02-25 NOTE — H&P (Signed)
Date of Initial H&P: 02/25/2018 by Dr. Everlene Farrier  History reviewed, patient examined, no change in status, stable for procedure.

## 2018-02-25 NOTE — Anesthesia Preprocedure Evaluation (Addendum)
Anesthesia Evaluation  Patient identified by MRN, date of birth, ID band Patient awake    Reviewed: Allergy & Precautions, NPO status , Patient's Chart, lab work & pertinent test results, reviewed documented beta blocker date and time   Airway Mallampati: II  TM Distance: >3 FB     Dental  (+) Upper Dentures, Lower Dentures   Pulmonary Current Smoker,           Cardiovascular      Neuro/Psych PSYCHIATRIC DISORDERS Anxiety Depression  Neuromuscular disease    GI/Hepatic PUD,   Endo/Other  Hypothyroidism   Renal/GU Renal disease     Musculoskeletal  (+) Arthritis ,   Abdominal   Peds  Hematology  (+) anemia ,   Anesthesia Other Findings Hb 10.4. EKG reviewed. Emergency procedure. Heart rate over 110.  Reproductive/Obstetrics                            Anesthesia Physical Anesthesia Plan  ASA: IV  Anesthesia Plan: General   Post-op Pain Management:    Induction: Intravenous  PONV Risk Score and Plan:   Airway Management Planned: Oral ETT  Additional Equipment:   Intra-op Plan:   Post-operative Plan:   Informed Consent: I have reviewed the patients History and Physical, chart, labs and discussed the procedure including the risks, benefits and alternatives for the proposed anesthesia with the patient or authorized representative who has indicated his/her understanding and acceptance.     Plan Discussed with: CRNA  Anesthesia Plan Comments:         Anesthesia Quick Evaluation

## 2018-02-25 NOTE — ED Notes (Addendum)
Pt belongings will be taken to OR, son not found in lobby, pt states he may have left.

## 2018-02-25 NOTE — Progress Notes (Signed)
A&Ox4.  Pain controlled with morphine. Dressing to abdomen intact without drainage.  Dr. Barton Dubois changed dressing around 1745.  Family visited.  Sinus tach on monitor with no respiratory distress and blood pressure stable.

## 2018-02-25 NOTE — Anesthesia Post-op Follow-up Note (Signed)
Anesthesia QCDR form completed.        

## 2018-02-25 NOTE — Anesthesia Postprocedure Evaluation (Signed)
Anesthesia Post Note  Patient: Annette Miller  Procedure(s) Performed: EXPLORATORY LAPAROTOMY, PARTMAN'S PROCEDURE, APPENDECTOMY, COLOSTOMY (N/A )  Patient location during evaluation: PACU Anesthesia Type: General Level of consciousness: awake and alert Pain management: pain level controlled Vital Signs Assessment: post-procedure vital signs reviewed and stable Respiratory status: spontaneous breathing, nonlabored ventilation, respiratory function stable and patient connected to nasal cannula oxygen Cardiovascular status: blood pressure returned to baseline, stable and tachycardic Postop Assessment: no apparent nausea or vomiting Anesthetic complications: no Comments: Continues to be tachycardic, otherwise ok. To ICU.     Last Vitals:  Vitals:   02/25/18 1800 02/25/18 1830  BP: 108/62   Pulse: (!) 111 (!) 110  Resp: (!) 24 19  Temp:    SpO2: 99% 99%    Last Pain:  Vitals:   02/25/18 1559  TempSrc:   PainSc: 2                  Masen Salvas S

## 2018-02-25 NOTE — Anesthesia Procedure Notes (Signed)
Procedure Name: Intubation Performed by: Zaylen Susman, CRNA Pre-anesthesia Checklist: Patient identified, Patient being monitored, Timeout performed, Emergency Drugs available and Suction available Patient Re-evaluated:Patient Re-evaluated prior to induction Oxygen Delivery Method: Circle system utilized Preoxygenation: Pre-oxygenation with 100% oxygen Induction Type: IV induction and Rapid sequence Laryngoscope Size: Miller and 2 Grade View: Grade I Tube type: Oral Tube size: 7.0 mm Number of attempts: 1 Airway Equipment and Method: Stylet Placement Confirmation: ETT inserted through vocal cords under direct vision,  positive ETCO2 and breath sounds checked- equal and bilateral Secured at: 20 cm Tube secured with: Tape Dental Injury: Teeth and Oropharynx as per pre-operative assessment        

## 2018-02-25 NOTE — Progress Notes (Signed)
Chaplain provided AD education and recommended completing a new HCPOA and LW when her son Annette Miller is available.  Patient is well-versed in the meaning and purpose of AD but has not discussed her wishes with family. This is relevant due to the patient's concerns about dementia. Please page the on-call chaplain to complete.

## 2018-02-25 NOTE — ED Provider Notes (Signed)
[facility] Emergency Department Provider Note    First MD Initiated Contact with Patient 02/25/18 0315     (approximate)  I have reviewed the triage vital signs and the nursing notes.   HISTORY  Chief Complaint Abdominal Pain    HPI Annette Miller is a 67 y.o. female list of chronic medical conditions including recently diagnosed duodenal ulcer diverticulosis and market erythema in the distal stomach noted on EGD and colonoscopy April of this year presents to the emergency department with generalized abdominal discomfort with current pain score of 10 out of 10 associated with nausea and vomiting x3 days   Past Medical History:  Diagnosis Date  . Allergy   . Anemia   . Arthritis   . Depression   . Goiter   . Hyperlipidemia   . Hypothyroidism   . Osteoporosis     Patient Active Problem List   Diagnosis Date Noted  . PUD (peptic ulcer disease) 12/15/2017  . Kidney stone 12/08/2017  . Anxiety 05/20/2017  . Allergy 05/20/2017  . Trapezius muscle spasm 01/27/2017  . H/O total hysterectomy 05/12/2016  . Hyperlipidemia 05/08/2015  . Depression 05/08/2015  . Hypothyroidism 05/08/2015    Past Surgical History:  Procedure Laterality Date  . ABDOMINAL HYSTERECTOMY    . COLONOSCOPY WITH PROPOFOL N/A 01/14/2018   Procedure: COLONOSCOPY WITH PROPOFOL;  Surgeon: Toney Reil, MD;  Location: [facility] ENDOSCOPY;  Service: Gastroenterology;  Laterality: N/A;  . ESOPHAGOGASTRODUODENOSCOPY (EGD) WITH PROPOFOL N/A 01/14/2018   Procedure: ESOPHAGOGASTRODUODENOSCOPY (EGD) WITH PROPOFOL;  Surgeon: Toney Reil, MD;  Location: [facility] ENDOSCOPY;  Service: Gastroenterology;  Laterality: N/A;  . TONSILLECTOMY      Prior to Admission medications   Medication Sig Start Date End Date Taking? Authorizing Provider  acetaminophen (TYLENOL) 325 MG tablet Take 650 mg by mouth every 6 (six) hours as needed for mild pain or fever.    [provider]    Ascorbic Acid (VITAMIN C) 1000 MG tablet Take 3,000 mg by mouth daily.    [provider]  buPROPion (WELLBUTRIN SR) 150 MG 12 hr tablet TAKE 1 TABLET (150 MG TOTAL) BY MOUTH 2 (TWO) TIMES DAILY. Patient taking differently: Take 150 mg by mouth daily.  06/14/17   Johnson, Megan P, DO  Calcium Carb-Cholecalciferol (OYSTER SHELL CALCIUM PLUS D PO) Take 1 tablet by mouth daily.     [provider]  diazepam (VALIUM) 5 MG tablet TAKE 1 TABLET BY MOUTH TWICE A DAY 11/04/17   Steele Sizer, MD  fluticasone (FLONASE) 50 MCG/ACT nasal spray Place 2 sprays into both nostrils daily. Patient not taking: Reported on 01/14/2018 05/20/17   Steele Sizer, MD  levothyroxine (SYNTHROID, LEVOTHROID) 25 MCG tablet Take 1 tablet (25 mcg total) by mouth daily. 05/20/17   Steele Sizer, MD  loratadine (CLARITIN) 10 MG tablet Take 1 tablet (10 mg total) by mouth daily. 05/20/17   Steele Sizer, MD  magnesium 30 MG tablet Take 30 mg by mouth daily.     [provider]  meloxicam (MOBIC) 15 MG tablet Take 1 tablet (15 mg total) by mouth daily. 12/31/17   Johnson, Megan P, DO  ondansetron (ZOFRAN ODT) 4 MG disintegrating tablet Take 1 tablet (4 mg total) by mouth every 8 (eight) hours as needed for nausea or vomiting. 12/09/17   Darci Current, MD  ondansetron (ZOFRAN) 4 MG tablet Take 1 tablet (4 mg total) by mouth every 8 (eight) hours as needed  for nausea or vomiting. 01/20/18   Steele Sizer, MD  pantoprazole (PROTONIX) 40 MG tablet Take 1 tablet (40 mg total) by mouth 2 (two) times daily. 12/15/17   Steele Sizer, MD  potassium chloride (K-DUR) 10 MEQ tablet Take 4 tablets (40 mEq total) by mouth daily for 2 days. 01/13/18 01/15/18  Toney Reil, MD  rosuvastatin (CRESTOR) 10 MG tablet Take 1 tablet (10 mg total) by mouth daily. 05/20/17   Steele Sizer, MD  Selenium 200 MCG CAPS Take by mouth daily.    [provider]  sucralfate (CARAFATE) 1 g tablet Take 1 tablet  (1 g total) by mouth 2 (two) times daily. 12/09/17 01/08/18  Darci Current, MD  vitamin E 1000 UNIT capsule Take 1,000 Units by mouth 2 (two) times daily.    [provider]  Zinc Sulfate (ZINC 15 PO) Take by mouth daily.    [provider]    Allergies Codeine and Prednisone  Family History  Problem Relation Age of Onset  . Cancer Mother   . AAA (abdominal aortic aneurysm) Father   . Cancer Maternal Grandmother   . Stroke Maternal Grandmother   . Cancer Paternal Grandfather     Social History Social History   Tobacco Use  . Smoking status: Current Every Day Smoker    Packs/day: 0.10    Types: Cigarettes  . Smokeless tobacco: Never Used  Substance Use Topics  . Alcohol use: No  . Drug use: No    Review of Systems Constitutional: No fever/chills Eyes: No visual changes. ENT: No sore throat. Cardiovascular: Denies chest pain. Respiratory: Denies shortness of breath. Gastrointestinal: Positive for abdominal pain and vomiting Genitourinary: Negative for dysuria. Musculoskeletal: Negative for neck pain.  Negative for back pain. Integumentary: Negative for rash. Neurological: Negative for headaches, focal weakness or numbness.   ____________________________________________   PHYSICAL EXAM:  VITAL SIGNS: ED Triage Vitals  Enc Vitals Group     BP 02/25/18 0320 112/80     Pulse Rate 02/25/18 0320 (!) 116     Resp 02/25/18 0320 (!) 32     Temp 02/25/18 0320 97.8 F (36.6 C)     Temp Source 02/25/18 0320 Oral     SpO2 02/25/18 0320 95 %     Weight 02/25/18 0322 45.4 kg (100 lb)     Height 02/25/18 0322 1.575 m (5\' 2" )     Head Circumference --      Peak Flow --      Pain Score 02/25/18 0321 7     Pain Loc --      Pain Edu? --      Excl. in GC? --     Constitutional: Alert and oriented.  Apparent discomfort  eyes: Conjunctivae are normal.  Head: Atraumatic. Mouth/Throat: Mucous membranes are moist.  Oropharynx non-erythematous. Neck: No  stridor.   Cardiovascular: Normal rate, regular rhythm. Good peripheral circulation. Grossly normal heart sounds. Respiratory: Normal respiratory effort.  No retractions. Lungs CTAB. Gastrointestinal: Generalized tenderness to palpation positive rebound tenderness Musculoskeletal: No lower extremity tenderness nor edema. No gross deformities of extremities. Neurologic:  Normal speech and language. No gross focal neurologic deficits are appreciated.  Skin:  Skin is warm, dry and intact. No rash noted. Psychiatric: Mood and affect are normal. Speech and behavior are normal.  ____________________________________________   LABS (all labs ordered are listed, but only abnormal results are displayed)  Labs Reviewed  CBC WITH DIFFERENTIAL/PLATELET - Abnormal; Notable for the following  components:      Result Value   RBC 3.14 (*)    Hemoglobin 10.4 (*)    HCT 30.0 (*)    Platelets 1,080 (*)    Neutro Abs 6.9 (*)    All other components within normal limits  COMPREHENSIVE METABOLIC PANEL - Abnormal; Notable for the following components:   Potassium 3.2 (*)    Chloride 93 (*)    Albumin 2.3 (*)    ALT 10 (*)    GFR calc non Af Amer 58 (*)    Anion gap 17 (*)    All other components within normal limits  LIPASE, BLOOD   ____________________________________________  EKG  ED ECG REPORT I, [place] N BROWN, the attending physician, personally viewed and interpreted this ECG.   Date: 02/25/2018  EKG Time: 3:21 AM  Rate: 119  Rhythm: Sinus tachycardia  Axis: Normal  Intervals: Normal  ST&T Change: None  ____________________________________________  RADIOLOGY I, [place] N BROWN, personally viewed and evaluated these images (plain radiographs) as part of my medical decision making, as well as reviewing the written report by the radiologist.  ED MD interpretation: Bowel perforation with scattered air throughout the abdomen and pelvis as per radiologist.  Official radiology  report(s): Ct Abdomen Pelvis W Contrast  Result Date: 02/25/2018 CLINICAL DATA:  Acute onset of generalized abdominal pain and tenderness. Loss of 36 pounds over the past month. EXAM: CT ABDOMEN AND PELVIS WITH CONTRAST TECHNIQUE: Multidetector CT imaging of the abdomen and pelvis was performed using the standard protocol following bolus administration of intravenous contrast. CONTRAST:  75mL OMNIPAQUE IOHEXOL 300 MG/ML  SOLN COMPARISON:  CT of the abdomen and pelvis from 12/08/2017 FINDINGS: Lower chest: Mild right basilar atelectasis or scarring is noted. Scattered coronary artery calcifications are seen. Hepatobiliary: The liver is grossly unremarkable. Trace free fluid is noted tracking about the liver. The gallbladder is diffusely distended. There is dilatation of the common bile duct to 1.0 cm in diameter, raising concern for distal obstruction. Pancreas: The pancreas is within normal limits. Spleen: The spleen is unremarkable in appearance. Adrenals/Urinary Tract: The adrenal glands are unremarkable in appearance. The kidneys are within normal limits. There is no evidence of hydronephrosis. No renal or ureteral stones are identified. No perinephric stranding is seen. Stomach/Bowel: Scattered free air is noted within the abdomen and pelvis, raising concern for bowel perforation. Mild complex fluid is noted tracking about large and small bowel loops. The site of bowel perforation is not definitely characterized on CT. There is diffuse wall thickening along the sigmoid colon, concerning for an underlying infectious or inflammatory colitis. The scattered pattern of air adjacent to the proximal sigmoid colon may correspond to the site of perforation. Scattered diverticulosis is noted along the sigmoid colon. The small bowel is grossly unremarkable. Residual wall thickening is noted along the antrum of the stomach and proximal duodenum. Adjacent prominent nodes are again seen, and underlying mass is a concern.  The appendix is unremarkable in appearance. Vascular/Lymphatic: Scattered calcification is seen along the abdominal aorta and its branches. The abdominal aorta is otherwise grossly unremarkable. The inferior vena cava is grossly unremarkable. No retroperitoneal lymphadenopathy is seen. No pelvic sidewall lymphadenopathy is identified. Reproductive: The bladder is mildly distended and grossly unremarkable. The patient is status post hysterectomy. No suspicious adnexal masses are seen. Other: No additional soft tissue abnormalities are seen. Musculoskeletal: No acute osseous abnormalities are identified. The visualized musculature is unremarkable in appearance. IMPRESSION: 1. Bowel perforation, with scattered free air in  the abdomen and pelvis. Mild complex fluid tracks about large and small bowel loops. The site of bowel perforation is not definitely characterized. It may be at the proximal sigmoid colon. 2. Diffuse wall thickening along the sigmoid colon is compatible with underlying infectious or inflammatory colitis. 3. Residual wall thickening along the antrum of the stomach and proximal duodenum. Adjacent prominent nodes again noted. Underlying mass is suspected. Given prior gastric ulceration, perforation in this region is also possibility. Endoscopy would be helpful for further evaluation, when and as deemed clinically appropriate. 4. Dilatation of the common bile duct to 1.0 cm in diameter, raising concern for distal obstruction. Diffuse gallbladder distention. Would correlate with LFTs. 5. Scattered diverticulosis along the sigmoid colon. Aortic Atherosclerosis (ICD10-I70.0). These results were called by telephone at the time of interpretation on 02/25/2018 at 6:09 am to Dr. York Cerise, who verbally acknowledged these results. Electronically Signed   By: Roanna Raider M.D.   On: 02/25/2018 06:15     .Critical Care Performed by: Darci Current, MD Authorized by: Darci Current, MD   Critical care  provider statement:    Critical care time (minutes):  45   Critical care time was exclusive of:  Separately billable procedures and treating other patients   Critical care was necessary to treat or prevent imminent or life-threatening deterioration of the following conditions: bowel perforation.   Critical care was time spent personally by me on the following activities:  Development of treatment plan with patient or surrogate, discussions with consultants, evaluation of patient's response to treatment, examination of patient, obtaining history from patient or surrogate, ordering and performing treatments and interventions, ordering and review of laboratory studies, ordering and review of radiographic studies, pulse oximetry, re-evaluation of patient's condition and review of old charts   I assumed direction of critical care for this patient from another provider in my specialty: no       ____________________________________________   INITIAL IMPRESSION / ASSESSMENT AND PLAN / ED COURSE  As part of my medical decision making, I reviewed the following data within the electronic MEDICAL RECORD NUMBER  67 year old female presented with above-stated history and physical exam concerning for possible bowel perforation/diverticulitis as such CT scan of the abdomen and pelvis was performed which confirmed evidence of bowel perforation.  Patient discussed with Dr. Everlene Farrier at 6:35 AM regarding clinical findings of bowel perforation.  Patient given IV vancomycin and Zosyn while in the emergency department.  Dr. Everlene Farrier presented to the emergency department plan to take the patient to the operating room for exploratory laparotomy    ____________________________________________  FINAL CLINICAL IMPRESSION(S) / ED DIAGNOSES  Final diagnoses:  Intestinal perforation (HCC)     MEDICATIONS GIVEN DURING THIS VISIT:  Medications  piperacillin-tazobactam (ZOSYN) IVPB 3.375 g (has no administration in time range)    vancomycin (VANCOCIN) injection 1 g (has no administration in time range)  gi cocktail (Maalox,Lidocaine,Donnatal) (30 mLs Oral Given 02/25/18 0329)  morphine 2 MG/ML injection 2 mg (2 mg Intravenous Given 02/25/18 0330)  ondansetron (ZOFRAN) injection 4 mg (4 mg Intravenous Given 02/25/18 0331)  sodium chloride 0.9 % bolus 1,000 mL (0 mLs Intravenous Stopped 02/25/18 0510)  iopamidol (ISOVUE-300) 61 % injection 15 mL (15 mLs Oral Contrast Given 02/25/18 0430)  sodium chloride 0.9 % bolus 1,000 mL (1,000 mLs Intravenous New Bag/Given 02/25/18 0500)  morphine 2 MG/ML injection 2 mg (2 mg Intravenous Given 02/25/18 0515)  iohexol (OMNIPAQUE) 300 MG/ML solution 75 mL (75 mLs Intravenous Contrast Given 02/25/18  [id number])     ED Discharge Orders    None       Note:  This document was prepared using Dragon voice recognition software and may include unintentional dictation errors.    Darci CurrentBrown, [place] N, MD 02/25/18 [phone]

## 2018-02-25 NOTE — ED Notes (Signed)
Updated Mindy in OR that patient was given Morphine and a second IV was started.  Pt transported to OR at this time by transport team.

## 2018-02-26 ENCOUNTER — Inpatient Hospital Stay: Payer: Medicare HMO

## 2018-02-26 ENCOUNTER — Inpatient Hospital Stay: Payer: Self-pay

## 2018-02-26 DIAGNOSIS — J9601 Acute respiratory failure with hypoxia: Secondary | ICD-10-CM

## 2018-02-26 LAB — CBC WITH DIFFERENTIAL/PLATELET
BASOS ABS: 0 10*3/uL (ref 0–0.1)
BASOS PCT: 0 %
EOS PCT: 0 %
Eosinophils Absolute: 0 10*3/uL (ref 0–0.7)
HEMATOCRIT: 22.6 % — AB (ref 35.0–47.0)
Hemoglobin: 7.4 g/dL — ABNORMAL LOW (ref 12.0–16.0)
Lymphocytes Relative: 11 %
Lymphs Abs: 0.9 10*3/uL — ABNORMAL LOW (ref 1.0–3.6)
MCH: 31.2 pg (ref 26.0–34.0)
MCHC: 32.8 g/dL (ref 32.0–36.0)
MCV: 95.1 fL (ref 80.0–100.0)
MONO ABS: 0.3 10*3/uL (ref 0.2–0.9)
Monocytes Relative: 3 %
Neutro Abs: 6.7 10*3/uL — ABNORMAL HIGH (ref 1.4–6.5)
Neutrophils Relative %: 86 %
PLATELETS: 740 10*3/uL — AB (ref 150–440)
RBC: 2.37 MIL/uL — ABNORMAL LOW (ref 3.80–5.20)
RDW: 14.6 % — AB (ref 11.5–14.5)
WBC: 7.8 10*3/uL (ref 3.6–11.0)

## 2018-02-26 LAB — GLUCOSE, CAPILLARY
GLUCOSE-CAPILLARY: 109 mg/dL — AB (ref 65–99)
Glucose-Capillary: 117 mg/dL — ABNORMAL HIGH (ref 65–99)
Glucose-Capillary: 92 mg/dL (ref 65–99)

## 2018-02-26 LAB — PREPARE RBC (CROSSMATCH)

## 2018-02-26 LAB — COMPREHENSIVE METABOLIC PANEL
ALT: 11 U/L — AB (ref 14–54)
ANION GAP: 6 (ref 5–15)
AST: 24 U/L (ref 15–41)
Albumin: 1.4 g/dL — ABNORMAL LOW (ref 3.5–5.0)
Alkaline Phosphatase: 68 U/L (ref 38–126)
BUN: 12 mg/dL (ref 6–20)
CHLORIDE: 103 mmol/L (ref 101–111)
CO2: 24 mmol/L (ref 22–32)
CREATININE: 0.61 mg/dL (ref 0.44–1.00)
Calcium: 7.1 mg/dL — ABNORMAL LOW (ref 8.9–10.3)
Glucose, Bld: 133 mg/dL — ABNORMAL HIGH (ref 65–99)
Potassium: 3.8 mmol/L (ref 3.5–5.1)
Sodium: 133 mmol/L — ABNORMAL LOW (ref 135–145)
Total Bilirubin: 0.2 mg/dL — ABNORMAL LOW (ref 0.3–1.2)
Total Protein: 4.6 g/dL — ABNORMAL LOW (ref 6.5–8.1)

## 2018-02-26 LAB — HIV ANTIBODY (ROUTINE TESTING W REFLEX): HIV SCREEN 4TH GENERATION: NONREACTIVE

## 2018-02-26 LAB — ABO/RH: ABO/RH(D): O POS

## 2018-02-26 LAB — MAGNESIUM: MAGNESIUM: 1.7 mg/dL (ref 1.7–2.4)

## 2018-02-26 LAB — PHOSPHORUS: PHOSPHORUS: 1.8 mg/dL — AB (ref 2.5–4.6)

## 2018-02-26 MED ORDER — ALBUMIN HUMAN 5 % IV SOLN
25.0000 g | Freq: Once | INTRAVENOUS | Status: AC
  Administered 2018-02-26: 25 g via INTRAVENOUS
  Filled 2018-02-26: qty 500

## 2018-02-26 MED ORDER — ORAL CARE MOUTH RINSE
15.0000 mL | Freq: Two times a day (BID) | OROMUCOSAL | Status: DC
  Administered 2018-02-26 – 2018-03-11 (×17): 15 mL via OROMUCOSAL

## 2018-02-26 MED ORDER — ALBUMIN HUMAN 5 % IV SOLN
50.0000 g | Freq: Once | INTRAVENOUS | Status: DC
  Filled 2018-02-26: qty 1000

## 2018-02-26 MED ORDER — POTASSIUM PHOSPHATES 15 MMOLE/5ML IV SOLN
10.0000 mmol | Freq: Once | INTRAVENOUS | Status: AC
  Administered 2018-02-26: 10 mmol via INTRAVENOUS
  Filled 2018-02-26: qty 3.33

## 2018-02-26 MED ORDER — PHENOL 1.4 % MT LIQD
1.0000 | OROMUCOSAL | Status: DC | PRN
  Administered 2018-02-26 (×3): 1 via OROMUCOSAL
  Filled 2018-02-26: qty 177

## 2018-02-26 MED ORDER — SODIUM CHLORIDE 0.9 % IV SOLN: Freq: Once | INTRAVENOUS | Status: DC

## 2018-02-26 NOTE — Progress Notes (Signed)
   02/26/18 1435  Clinical Encounter Type  Visited With Patient  Visit Type Initial  Referral From Nurse  Consult/Referral To Chaplain  Spiritual Encounters  Spiritual Needs Brochure;Emotional   CH received a PG to complete a AD with PT.  CH reported to RM and spoke with PT about AD. She had not completed form. CH will follow up when she is ready to complete form.

## 2018-02-26 NOTE — Progress Notes (Signed)
Patient with heart rate 105-110, blood pressure 96/45, pale with 1 gram hemoglobin drop.  Dr. Earlene Plateravis called and notified.  Orders to be placed by MD.

## 2018-02-26 NOTE — Progress Notes (Signed)
Patient has orders to be transferred to the floor. [facility] Charity fundraiserN.

## 2018-02-26 NOTE — Consult Note (Signed)
Name: Annette Miller MRN: [id number] DOB: Feb 24, 1951     CONSULTATION DATE: 02/25/2018  67 years old lady with history of COPD, chronic tobacco abuse, duodenal ulcer, gastritis, diverticulosis depression, goiter, hypothyroidism, dyslipidemia, anemia and arthritis.  The patient is admitted with peritonitis perforated sigmoid, underwent laparotomy, Hartman's procedure, appendectomy and the colostomy. All history was obtained from medical record and family at the bedside  PAST MEDICAL HISTORY :   has a past medical history of Allergy, Anemia, Arthritis, Depression, Goiter, Hyperlipidemia, Hypothyroidism, and Osteoporosis.  has a past surgical history that includes Abdominal hysterectomy; Tonsillectomy; Colonoscopy with propofol (N/A, 01/14/2018); Esophagogastroduodenoscopy (egd) with propofol (N/A, 01/14/2018); and laparotomy (N/A, 02/25/2018). Prior to Admission medications   Medication Sig Start Date End Date Taking? Authorizing Provider  acetaminophen (TYLENOL) 325 MG tablet Take 650 mg by mouth every 6 (six) hours as needed for mild pain or fever.    [provider]  Ascorbic Acid (VITAMIN C) 1000 MG tablet Take 3,000 mg by mouth daily.    [provider]  buPROPion (WELLBUTRIN SR) 150 MG 12 hr tablet TAKE 1 TABLET (150 MG TOTAL) BY MOUTH 2 (TWO) TIMES DAILY. Patient taking differently: Take 150 mg by mouth daily.  06/14/17   Johnson, Megan P, DO  Calcium Carb-Cholecalciferol (OYSTER SHELL CALCIUM PLUS D PO) Take 1 tablet by mouth daily.     [provider]  diazepam (VALIUM) 5 MG tablet TAKE 1 TABLET BY MOUTH TWICE A DAY 11/04/17   Steele Sizer, MD  fluticasone (FLONASE) 50 MCG/ACT nasal spray Place 2 sprays into both nostrils daily. Patient not taking: Reported on 01/14/2018 05/20/17   Steele Sizer, MD  levothyroxine (SYNTHROID, LEVOTHROID) 25 MCG tablet Take 1 tablet (25 mcg total) by mouth daily. 05/20/17   Steele Sizer, MD  loratadine (CLARITIN) 10 MG  tablet Take 1 tablet (10 mg total) by mouth daily. 05/20/17   Steele Sizer, MD  magnesium 30 MG tablet Take 30 mg by mouth daily.     [provider]  meloxicam (MOBIC) 15 MG tablet Take 1 tablet (15 mg total) by mouth daily. 12/31/17   Johnson, Megan P, DO  ondansetron (ZOFRAN ODT) 4 MG disintegrating tablet Take 1 tablet (4 mg total) by mouth every 8 (eight) hours as needed for nausea or vomiting. 12/09/17   Darci Current, MD  ondansetron (ZOFRAN) 4 MG tablet TAKE 1 TABLET BY MOUTH EVERY 8 HOURS AS NEEDED FOR NAUSEA AND VOMITING 02/25/18   Gabriel Cirri, NP  pantoprazole (PROTONIX) 40 MG tablet Take 1 tablet (40 mg total) by mouth 2 (two) times daily. 12/15/17   Steele Sizer, MD  potassium chloride (K-DUR) 10 MEQ tablet Take 4 tablets (40 mEq total) by mouth daily for 2 days. 01/13/18 01/15/18  Toney Reil, MD  rosuvastatin (CRESTOR) 10 MG tablet Take 1 tablet (10 mg total) by mouth daily. 05/20/17   Steele Sizer, MD  Selenium 200 MCG CAPS Take by mouth daily.    [provider]  sucralfate (CARAFATE) 1 g tablet Take 1 tablet (1 g total) by mouth 2 (two) times daily. 12/09/17 01/08/18  Darci Current, MD  vitamin E 1000 UNIT capsule Take 1,000 Units by mouth 2 (two) times daily.    [provider]  Zinc Sulfate (ZINC 15 PO) Take by mouth daily.    [provider]   Allergies  Allergen Reactions  . Codeine Diarrhea and Nausea And Vomiting  . Prednisone Other (  See Comments)    Reaction: violence    FAMILY HISTORY:  family history includes AAA (abdominal aortic aneurysm) in her father; Cancer in her maternal grandmother, mother, and paternal grandfather; Stroke in her maternal grandmother. SOCIAL HISTORY:  reports that she has been smoking cigarettes.  She has been smoking about 0.10 packs per day. She has never used smokeless tobacco. She reports that she does not drink alcohol or use drugs.  REVIEW OF SYSTEMS:   Unable to obtain due to  critical illness   VITAL SIGNS: Temp:  [97.5 F (36.4 C)-98.4 F (36.9 C)] 97.6 F (36.4 C) (06/01 0900) Pulse Rate:  [103-119] 113 (06/01 0900) Resp:  [17-28] 21 (06/01 0900) BP: (88-113)/(50-78) 102/78 (06/01 0900) SpO2:  [87 %-100 %] 94 % (06/01 0908) Arterial Line BP: (84-146)/(47-75) 99/50 (06/01 0636)  Physical Examination:  Awake and oriented with no acute neurological deficits On nasal cannula, able to talk in full sentences, bilateral equal air entry no adventitious sounds S1 & S2 are audible with no murmur Post laparotomy, colostomy in place and feeble peristalsis No leg edema  ASSESSMENT / PLAN: Perforated sigmoid colon status post laparotomy, Hartman's procedure, appendectomy and colostomy. -Management as per surgery -On Zosyn  Hyponatremia, hypotension with intravascular volume depletion -Mild volume, monitor hemodynamics and electrolytes  Hypophosphatemia -Replete and monitor electrolytes  Anemia -Keep hemoglobin more than 7 g/dL   Thrombocytosis -Monitor platelets  Full code  Supportive care  Critical care time 35 minutes

## 2018-02-26 NOTE — Progress Notes (Signed)
SURGICAL PROGRESS NOTE  Hospital Day(s): 1.   Post op day(s): 1 Day Post-Op.   Interval History: Patient seen and examined, no acute events or new complaints overnight. Patient reports only mild-/moderate- peri-incisional abdominal pain with small amount of clear liquid in colostomy bag, denies N/V, fever/chills, CP, or SOB. Only 200 mL from NG tube reported per RN over this past shift.  Review of Systems:  Constitutional: denies fever, chills  Respiratory: denies any shortness of breath  Cardiovascular: denies chest pain or palpitations  Gastrointestinal: abdominal pain, N/V, and bowel function as per interval history Musculoskeletal: denies pain, decreased motor or sensation Integumentary: denies any other rashes or skin discolorations except post-surgical abdominal wounds  Vital signs in last 24 hours: [min-max] current  Temp:  [97.4 F (36.3 C)-98.4 F (36.9 C)] 98.1 F (36.7 C) (06/01 0400) Pulse Rate:  [103-119] 111 (06/01 0600) Resp:  [17-28] 23 (06/01 0600) BP: (84-126)/(48-78) 88/60 (06/01 0600) SpO2:  [87 %-100 %] 100 % (06/01 0650) Arterial Line BP: (84-146)/(47-75) 99/50 (06/01 0636)     Height: 5\' 2"  (157.5 cm) Weight: 100 lb (45.4 kg) BMI (Calculated): 18.29   Intake/Output this shift:  No intake/output data recorded.   Intake/Output last 2 shifts:  @IOLAST2SHIFTS @   Physical Exam:  Constitutional: alert, cooperative and no distress  Respiratory: breathing non-labored at rest  Cardiovascular: regular rate and sinus rhythm  Gastrointestinal: abdomen soft and non-distended with pink viable colostomy with scant clear liquid in colostomy bag, open midline laparotomy wound without any surrounding erythema or drainage, mild peri-incisional tenderness to palpation  Labs:  CBC Latest Ref Rng & Units 02/26/2018 02/25/2018 02/25/2018  WBC 3.6 - 11.0 K/uL 7.8 6.5 8.5  Hemoglobin 12.0 - 16.0 g/dL 7.4(L) 8.2(L) 10.4(L)  Hematocrit 35.0 - 47.0 % 22.6(L) 23.6(L) 30.0(L)   Platelets 150 - 440 K/uL 740(H) 698(H) 1,080(HH)   CMP Latest Ref Rng & Units 02/26/2018 02/25/2018 02/25/2018  Glucose 65 - 99 mg/dL 161(W) 960(A) 96  BUN 6 - 20 mg/dL 12 14 18   Creatinine 0.44 - 1.00 mg/dL 5.40 9.81 1.91  Sodium 135 - 145 mmol/L 133(L) 131(L) 136  Potassium 3.5 - 5.1 mmol/L 3.8 3.2(L) 3.2(L)  Chloride 101 - 111 mmol/L 103 101 93(L)  CO2 22 - 32 mmol/L 24 20(L) 26  Calcium 8.9 - 10.3 mg/dL 7.1(L) 6.9(L) 8.9  Total Protein 6.5 - 8.1 g/dL 4.6(L) - 7.3  Total Bilirubin 0.3 - 1.2 mg/dL 4.7(W) - 0.5  Alkaline Phos 38 - 126 U/L 68 - 107  AST 15 - 41 U/L 24 - 35  ALT 14 - 54 U/L 11(L) - 10(L)   Imaging studies: No new pertinent imaging studies   Assessment/Plan:  67 y.o. female doing overall well with anticipated post-surgical ileus and acute on chronic anemia, sinus tachycardia, mild hypotension, and relative facial palor 1 Day Post-Op s/p Hartmann's partial colectomy with end-colostomy and rectal stump for perforated sigmoid colonic diverticulitis with feculent peritonitis and sepsis, complicated by pertinent comorbidities including chronic recent malnutrition, anemia, HLD, hypothyroidism, osteoarthritis, and major depression disorder.   - pain control prn (minimize narcotics)   - transfuse 1 Unit PRBC's and check post-transfusion CBC  - continue NG tube for now, IV fluids, monitor abdominal exam and ostomy function  - discontinue Left radial arterial line and Foley catheter  - medical management of comorbidities  - okay to transfer to med-surg  - DVT prophylaxis   All of the above findings and recommendations were discussed with the patient and patient's  ICU RN, and all of patient's questions were answered to her expressed satisfaction.  -- Scherrie GerlachJason E. Earlene Plateravis, MD, RPVI [place]: [facility] General Surgery - Partnering for exceptional care. Office: [phone]

## 2018-02-26 NOTE — Progress Notes (Signed)
Patient in need of blood transfusion. Patient also needing type and screen. PICC team here to insert PICC, order entered for PICC team to evaluate patient.

## 2018-02-26 NOTE — Progress Notes (Signed)
Notified pharmacy for clarification/verification of Albumin order. See new order.

## 2018-02-26 NOTE — Progress Notes (Signed)
Attempted peripheral IV x2 with no success.  IV team to be consulted.

## 2018-02-27 DIAGNOSIS — E43 Unspecified severe protein-calorie malnutrition: Secondary | ICD-10-CM

## 2018-02-27 LAB — CBC WITH DIFFERENTIAL/PLATELET
Basophils Absolute: 0 10*3/uL (ref 0–0.1)
Basophils Relative: 0 %
EOS ABS: 0.1 10*3/uL (ref 0–0.7)
EOS PCT: 1 %
HCT: 24.1 % — ABNORMAL LOW (ref 35.0–47.0)
Hemoglobin: 8.3 g/dL — ABNORMAL LOW (ref 12.0–16.0)
LYMPHS ABS: 1.1 10*3/uL (ref 1.0–3.6)
LYMPHS PCT: 10 %
MCH: 31.9 pg (ref 26.0–34.0)
MCHC: 34.5 g/dL (ref 32.0–36.0)
MCV: 92.5 fL (ref 80.0–100.0)
MONO ABS: 0.4 10*3/uL (ref 0.2–0.9)
MONOS PCT: 4 %
Neutro Abs: 8.6 10*3/uL — ABNORMAL HIGH (ref 1.4–6.5)
Neutrophils Relative %: 85 %
PLATELETS: 622 10*3/uL — AB (ref 150–440)
RBC: 2.6 MIL/uL — ABNORMAL LOW (ref 3.80–5.20)
RDW: 16.2 % — AB (ref 11.5–14.5)
WBC: 10.2 10*3/uL (ref 3.6–11.0)

## 2018-02-27 LAB — COMPREHENSIVE METABOLIC PANEL
ALBUMIN: 1.8 g/dL — AB (ref 3.5–5.0)
ALT: 11 U/L — AB (ref 14–54)
AST: 21 U/L (ref 15–41)
Alkaline Phosphatase: 74 U/L (ref 38–126)
Anion gap: 6 (ref 5–15)
BUN: 7 mg/dL (ref 6–20)
CHLORIDE: 104 mmol/L (ref 101–111)
CO2: 24 mmol/L (ref 22–32)
CREATININE: 0.52 mg/dL (ref 0.44–1.00)
Calcium: 7.4 mg/dL — ABNORMAL LOW (ref 8.9–10.3)
GFR calc Af Amer: >60 mL/min (ref >60)
GLUCOSE: 94 mg/dL (ref 65–99)
POTASSIUM: 4 mmol/L (ref 3.5–5.1)
Sodium: 134 mmol/L — ABNORMAL LOW (ref 135–145)
Total Bilirubin: 0.7 mg/dL (ref 0.3–1.2)
Total Protein: 5.2 g/dL — ABNORMAL LOW (ref 6.5–8.1)

## 2018-02-27 LAB — GLUCOSE, CAPILLARY
GLUCOSE-CAPILLARY: 99 mg/dL (ref 65–99)
Glucose-Capillary: 93 mg/dL (ref 65–99)
Glucose-Capillary: 99 mg/dL (ref 65–99)

## 2018-02-27 LAB — PHOSPHORUS: Phosphorus: 1.3 mg/dL — ABNORMAL LOW (ref 2.5–4.6)

## 2018-02-27 LAB — MAGNESIUM: MAGNESIUM: 1.9 mg/dL (ref 1.7–2.4)

## 2018-02-27 MED ORDER — SODIUM CHLORIDE 0.9% FLUSH
10.0000 mL | INTRAVENOUS | Status: DC | PRN
  Administered 2018-02-28: 20 mL
  Administered 2018-03-03: 10 mL
  Filled 2018-02-27 (×2): qty 40

## 2018-02-27 MED ORDER — MORPHINE SULFATE (PF) 2 MG/ML IV SOLN
2.0000 mg | INTRAVENOUS | Status: DC | PRN
  Administered 2018-02-27 – 2018-03-11 (×49): 2 mg via INTRAVENOUS
  Filled 2018-02-27 (×52): qty 1

## 2018-02-27 MED ORDER — SODIUM CHLORIDE 0.9% FLUSH
10.0000 mL | Freq: Two times a day (BID) | INTRAVENOUS | Status: DC
  Administered 2018-02-27: 10 mL
  Administered 2018-02-27 – 2018-02-28 (×2): 20 mL
  Administered 2018-02-28 – 2018-03-03 (×6): 10 mL

## 2018-02-27 MED ORDER — KETOROLAC TROMETHAMINE 15 MG/ML IJ SOLN
15.0000 mg | Freq: Four times a day (QID) | INTRAMUSCULAR | Status: DC
  Administered 2018-02-27 – 2018-03-04 (×19): 15 mg via INTRAVENOUS
  Filled 2018-02-27 (×19): qty 1

## 2018-02-27 MED ORDER — OXYCODONE HCL 5 MG PO TABS
5.0000 mg | ORAL_TABLET | ORAL | Status: DC | PRN
  Administered 2018-02-27: 10 mg via ORAL
  Administered 2018-02-27: 5 mg via ORAL
  Administered 2018-02-28 (×2): 10 mg via ORAL
  Administered 2018-02-28: 5 mg via ORAL
  Administered 2018-02-28 – 2018-03-11 (×38): 10 mg via ORAL
  Filled 2018-02-27 (×23): qty 2
  Filled 2018-02-27: qty 1
  Filled 2018-02-27 (×17): qty 2
  Filled 2018-02-27: qty 1
  Filled 2018-02-27 (×4): qty 2

## 2018-02-27 NOTE — Care Management Note (Signed)
Case Management Note  Patient Details  Name: Annette Miller MRN: [id number] Date of Birth: [birth year]  Subjective/Objective:    Pt POD2 from colectomy and new ostomy placement. RNCM received consultation for medications needs. Patient states that she is paying $93 for her Wellbutrin at [facility] [place]. Does not report any other medication issues. Printed coupon for Wellbutrin at $18. Patient lives with her son Annette Miller [phone]. States that she is independent at baseline and requires no assistance or DME. No transportation concerns. PCP is Annette Miller and she followed up with him in March of 2019.                  Action/Plan:  Will provide coupon for discounted medication price. RNCM will continue to follow for discharge planning.  Expected Discharge Date:                  Expected Discharge Plan:     In-House Referral:     Discharge planning Services     Post Acute Care Choice:    Choice offered to:     DME Arranged:    DME Agency:     HH Arranged:    HH Agency:     Status of Service:     If discussed at [employer] Meetings, dates discussed:    Additional Comments:  Christina Gintz A, RN 02/27/2018, 4:45 PM

## 2018-02-27 NOTE — Progress Notes (Signed)
SURGICAL PROGRESS NOTE  Hospital Day(s): 2.   Post op day(s): 2 Days Post-Op.   Interval History: Patient seen and examined, no acute events or new complaints overnight. Patient reports moderate peri-incisional pain, overall moderately controlled with increased clear pink fluid from her colostomy without much gas, denies any N/V, abdominal distention, fever/chills, CP, or SOB.  Review of Systems:  Constitutional: denies fever, chills  Respiratory: denies any shortness of breath  Cardiovascular: denies chest pain or palpitations  Gastrointestinal: abdominal pain, N/V, and bowel function as per interval history Musculoskeletal: denies pain, decreased motor or sensation Integumentary: denies any other rashes or skin discolorations except post-surgical incisions  Vital signs in last 24 hours: [min-max] current  Temp:  [97.6 F (36.4 C)-99.2 F (37.3 C)] 97.8 F (36.6 C) (06/02 0550) Pulse Rate:  [103-121] 103 (06/02 0550) Resp:  [11-21] 20 (06/02 0550) BP: (88-121)/(33-79) 111/68 (06/02 0550) SpO2:  [90 %-100 %] 94 % (06/02 0550) Arterial Line BP: (129)/(48) 129/48 (06/01 1200)     Height: 5\' 2"  (157.5 cm) Weight: 100 lb (45.4 kg) BMI (Calculated): 18.29   Intake/Output this shift:  Total I/O In: -  Out: 410 [Urine:150; Emesis/NG output:150; Drains:110]   Intake/Output last 2 shifts:  @IOLAST2SHIFTS @   Physical Exam:  Constitutional: alert, cooperative and no distress  Respiratory: breathing non-labored at rest  Cardiovascular: regular rate and sinus rhythm  Gastrointestinal: abdomen soft and non-distended with pink viable colostomy with scant clear liquid in colostomy bag, open midline laparotomy wound without any surrounding erythema or drainage, mild peri-incisional tenderness to palpation  Labs:  CBC Latest Ref Rng & Units 02/26/2018 02/25/2018 02/25/2018  WBC 3.6 - 11.0 K/uL 7.8 6.5 8.5  Hemoglobin 12.0 - 16.0 g/dL 7.4(L) 8.2(L) 10.4(L)  Hematocrit 35.0 - 47.0 % 22.6(L)  23.6(L) 30.0(L)  Platelets 150 - 440 K/uL 740(H) 698(H) 1,080(HH)   CMP Latest Ref Rng & Units 02/26/2018 02/25/2018 02/25/2018  Glucose 65 - 99 mg/dL 409(W) 119(J) 96  BUN 6 - 20 mg/dL 12 14 18   Creatinine 0.44 - 1.00 mg/dL 4.78 2.95 6.21  Sodium 135 - 145 mmol/L 133(L) 131(L) 136  Potassium 3.5 - 5.1 mmol/L 3.8 3.2(L) 3.2(L)  Chloride 101 - 111 mmol/L 103 101 93(L)  CO2 22 - 32 mmol/L 24 20(L) 26  Calcium 8.9 - 10.3 mg/dL 7.1(L) 6.9(L) 8.9  Total Protein 6.5 - 8.1 g/dL 4.6(L) - 7.3  Total Bilirubin 0.3 - 1.2 mg/dL 3.0(Q) - 0.5  Alkaline Phos 38 - 126 U/L 68 - 107  AST 15 - 41 U/L 24 - 35  ALT 14 - 54 U/L 11(L) - 10(L)   Imaging studies: No new pertinent imaging studies   Assessment/Plan: 67 y.o. female doing overall well with resolving anticipated post-surgical ileus and improved acute on chronic anemia and normotensive despite peristent mild sinus tachycardia, 2 Days Post-Op s/p Hartmann's partial colectomy with end-colostomy and rectal stump for perforated sigmoid colonic diverticulitis with feculent peritonitis and sepsis, complicated by pertinent comorbidities including chronic severe malnutrition, anemia, HLD, hypothyroidism, osteoarthritis, and major depression disorder.              - pain control prn (minimize narcotics)             - NG tube removed, okay to start clear liquids diet  - continue to monitor abdominal exam and ostomy function             - medical management of medical comorbidities             -  DVT prophylaxis, ambulation encouraged  All of the above findings and recommendations were discussed with the patient, and all of patient's questions were answered to her expressed satisfaction.  -- Scherrie GerlachJason E. Earlene Plateravis, MD, RPVI [place]: [facility] General Surgery - Partnering for exceptional care. Office: [phone]

## 2018-02-27 NOTE — Progress Notes (Signed)
Initial Nutrition Assessment  DOCUMENTATION CODES:   Severe malnutrition in context of chronic illness  INTERVENTION:  Monitor for diet advancement and needs  NUTRITION DIAGNOSIS:   Severe Malnutrition related to chronic illness as evidenced by energy intake < 75% for > or equal to 1 month, percent weight loss.  GOAL:   Patient will meet greater than or equal to 90% of their needs  MONITOR:   Diet advancement, PO intake, I & O's  REASON FOR ASSESSMENT:   Malnutrition Screening Tool    ASSESSMENT:   67 years old lady with history of COPD, chronic tobacco abuse, duodenal ulcer, gastritis, diverticulosis depression, goiter, hypothyroidism, dyslipidemia, anemia and arthritis.  The patient is admitted with peritonitis perforated sigmoid, underwent laparotomy, Hartman's procedure, appendectomy and the colostomy.  RD drawn to patient for positive MST. Spoke with Annette Miller at bedside. She reports not eating anything for the past 3 months, will sip on ginger ale or pepsi but no solid food intake. She relates this to a bleeding ulcer, which also caused her to lose 36 pounds during that time frame, a 26% severe weight loss from her usual body weight of 136. Per chart, she exhibits a 20 pound/17% severe weight loss over 1.75 months. Patient tolerate some jello for lunch today, currently on clear liquid diet. Does not like oral nutrition supplements, when asked if she would drink she said "hell no!" Will monitor PO intake,diet advancement, and provide snacks and other interventions as warranted and accepted by patient.  Labs reviewed:  Na 134, Phos 1.3  Medications reviewed and include:  Synthroid D5 1/2 NS 20 K+ at 15100mL/hr --> 408 calories  NUTRITION - FOCUSED PHYSICAL EXAM:    Most Recent Value  Orbital Region  Mild depletion  Upper Arm Region  Moderate depletion  Thoracic and Lumbar Region  Mild depletion  Buccal Region  No depletion  Temple Region  Mild depletion   Clavicle Bone Region  Mild depletion  Clavicle and Acromion Bone Region  No depletion  Scapular Bone Region  Mild depletion  Dorsal Hand  Mild depletion  Patellar Region  Mild depletion  Anterior Thigh Region  Mild depletion  Posterior Calf Region  Mild depletion       Diet Order:   Diet Order           Diet clear liquid Room service appropriate? Yes; Fluid consistency: Thin  Diet effective now          EDUCATION NEEDS:   Not appropriate for education at this time  Skin:  Skin Assessment: Skin Integrity Issues: Skin Integrity Issues:: Incisions Incisions: closed to abdomen  Last BM:  02/25/2018  Height:   Ht Readings from Last 1 Encounters:  02/25/18 5\' 2"  (1.575 m)    Weight:   Wt Readings from Last 1 Encounters:  02/25/18 100 lb (45.4 kg)    Ideal Body Weight:  50 kg  BMI:  Body mass index is 18.29 kg/m.  Estimated Nutritional Needs:   Kcal:  [id number] calories  Protein:  68-77 grams (1.5-1.7g/kg)  Fluid:  >1.5L  Annette AnoWilliam M. Macyn Remmert, MS, RD LDN Inpatient Clinical Dietitian Pager [phone]

## 2018-02-27 NOTE — Care Management Important Message (Signed)
Important Message  Patient Details  Name: Annette Miller MRN: [id number] Date of Birth: 1951/06/15   Medicare Important Message Given:  Yes    Riannon Mukherjee A, RN 02/27/2018, 4:31 PM

## 2018-02-28 LAB — TYPE AND SCREEN
ABO/RH(D): O POS
ANTIBODY SCREEN: NEGATIVE
Unit division: 0

## 2018-02-28 LAB — CBC
HEMATOCRIT: 24.8 % — AB (ref 35.0–47.0)
HEMOGLOBIN: 8.4 g/dL — AB (ref 12.0–16.0)
MCH: 30.8 pg (ref 26.0–34.0)
MCHC: 33.7 g/dL (ref 32.0–36.0)
MCV: 91.5 fL (ref 80.0–100.0)
Platelets: 690 10*3/uL — ABNORMAL HIGH (ref 150–440)
RBC: 2.71 MIL/uL — AB (ref 3.80–5.20)
RDW: 15.9 % — ABNORMAL HIGH (ref 11.5–14.5)
WBC: 16.9 10*3/uL — ABNORMAL HIGH (ref 3.6–11.0)

## 2018-02-28 LAB — BPAM RBC
BLOOD PRODUCT EXPIRATION DATE: 201906052359
ISSUE DATE / TIME: 201906020033
Unit Type and Rh: 9500

## 2018-02-28 LAB — BASIC METABOLIC PANEL
Anion gap: 6 (ref 5–15)
CHLORIDE: 101 mmol/L (ref 101–111)
CO2: 25 mmol/L (ref 22–32)
CREATININE: 0.5 mg/dL (ref 0.44–1.00)
Calcium: 7.2 mg/dL — ABNORMAL LOW (ref 8.9–10.3)
GFR calc Af Amer: >60 mL/min (ref >60)
GFR calc non Af Amer: >60 mL/min (ref >60)
Glucose, Bld: 121 mg/dL — ABNORMAL HIGH (ref 65–99)
POTASSIUM: 3.9 mmol/L (ref 3.5–5.1)
Sodium: 132 mmol/L — ABNORMAL LOW (ref 135–145)

## 2018-02-28 LAB — GLUCOSE, CAPILLARY
GLUCOSE-CAPILLARY: 91 mg/dL (ref 65–99)
Glucose-Capillary: 110 mg/dL — ABNORMAL HIGH (ref 65–99)

## 2018-02-28 LAB — SURGICAL PATHOLOGY

## 2018-02-28 MED ORDER — ACETAMINOPHEN 325 MG PO TABS
650.0000 mg | ORAL_TABLET | Freq: Four times a day (QID) | ORAL | Status: DC | PRN
Start: 2018-02-28 — End: 2018-03-11
  Administered 2018-02-28 – 2018-03-10 (×5): 650 mg via ORAL
  Filled 2018-02-28 (×4): qty 2

## 2018-02-28 NOTE — Progress Notes (Signed)
3 Days Post-Op  Subjective: Status post Hartman's procedure for perforated diverticulitis with feculent peritonitis.  Patient feels well today but has no ostomy output yet.  She denies nausea vomiting does have moderate incisional pain.  Her wound was left open.  Objective: Vital signs in last 24 hours: Temp:  [98.9 F (37.2 C)-100.9 F (38.3 C)] 98.9 F (37.2 C) (06/03 0526) Pulse Rate:  [116-118] 116 (06/03 0526) Resp:  [20] 20 (06/03 0526) BP: (125-149)/(62-75) 149/72 (06/03 0526) SpO2:  [93 %-100 %] 100 % (06/03 0526) Last BM Date: 02/25/18  Intake/Output from previous day: 06/02 0701 - 06/03 0700 In: 2240 [I.V.:2090; IV Piggyback:150] Out: 2440 [Urine:1650; Emesis/NG output:290; Drains:500] Intake/Output this shift: Total I/O In: 201 [I.V.:201] Out: 100 [Drains:80; Stool:20]  Physical exam:  Vital signs are stable mild low-grade temp to 100.9.  Abdomen is soft nondistended nontympanitic and nontender.  Wound is open and granulating.  Ostomy is pink but not yet functional.  Calves are nontender  Lab Results: CBC  Recent Labs    02/27/18 0804 02/28/18 0512  WBC 10.2 16.9*  HGB 8.3* 8.4*  HCT 24.1* 24.8*  PLT 622* 690*   BMET Recent Labs    02/27/18 0804 02/28/18 0512  NA 134* 132*  K 4.0 3.9  CL 104 101  CO2 24 25  GLUCOSE 94 121*  BUN 7 <5*  CREATININE 0.52 0.50  CALCIUM 7.4* 7.2*   PT/INR No results for input(s): LABPROT, INR in the last 72 hours. ABG No results for input(s): PHART, HCO3 in the last 72 hours.  Invalid input(s): PCO2, PO2  Studies/Results: Dg Chest Port 1 View  Result Date: 02/26/2018 CLINICAL DATA:  67 year old female status post gastric tube placement EXAM: PORTABLE CHEST 1 VIEW COMPARISON:  CT scan of the abdomen and pelvis 02/25/2018 FINDINGS: A nasogastric tube is present and projects over the left upper quadrant in the expected position. Cardiac and mediastinal contours are within normal limits. Atherosclerotic calcifications  present in the transverse aorta. Mild left retrocardiac airspace opacity favored to reflect atelectasis. No pleural effusion or pneumothorax. No acute osseous abnormality. IMPRESSION: 1. Well-positioned gastrostomy tube. 2. Left retrocardiac airspace opacity may reflect atelectasis or infiltrate. Atelectasis is favored. 3.  Aortic Atherosclerosis (ICD10-170.0) Electronically Signed   By: Malachy Moan M.D.   On: 02/26/2018 14:15   [place] Ekg Site Rite  Result Date: 02/26/2018 If Site Rite image not attached, placement could not be confirmed due to current cardiac rhythm.   Anti-infectives: Anti-infectives (From admission, onward)   Start     Dose/Rate Route Frequency Ordered Stop   02/25/18 1600  piperacillin-tazobactam (ZOSYN) IVPB 3.375 g     3.375 g 12.5 mL/hr over 240 Minutes Intravenous Every 8 hours 02/25/18 1326     02/25/18 0645  vancomycin (VANCOCIN) IVPB 1000 mg/200 mL premix     1,000 mg 200 mL/hr over 60 Minutes Intravenous  Once 02/25/18 0639 02/25/18 0814   02/25/18 0630  piperacillin-tazobactam (ZOSYN) IVPB 3.375 g     3.375 g 100 mL/hr over 30 Minutes Intravenous  Once 02/25/18 0619 02/25/18 0715   02/25/18 0630  vancomycin (VANCOCIN) injection 1 g  Status:  Discontinued     1 g Intravenous  Once 02/25/18 0619 02/25/18 0639      Assessment/Plan: s/p Procedure(s): EXPLORATORY LAPAROTOMY, PARTMAN'S PROCEDURE, APPENDECTOMY, COLOSTOMY   White blood cell count has risen.  But patient appears well overall.  We will continue to monitor at this time.  She is postop day 3 and  would not recommend CT scanning just yet.  Awaiting bowel function.  Lattie Hawichard E Nica Friske, MD, FACS  02/28/2018

## 2018-02-28 NOTE — Consult Note (Signed)
WOC Nurse ostomy consult note Stoma type/location: LUQ colostomy Stomal assessment/size: 1 3/8" pink moist and round Peristomal assessment: intact  Edematous abdomen  Midline abdominal incision, healing by secondary intention Treatment options for stomal/peristomal skin: barrier ring Output none in pouch at this time Ostomy pouching: 2pc. 2 1/4" pouch with barrier ring Education provided: Patient is former Engineer, civil (consulting)nurse and feels comfortable caring for herself.  WE perform a pouch change today.  Discussed measuring, twice weekly pouch changes, emptying when 1/3 full.  She was impressed with charcoal filter and roll closure. These are improvements from what she us used to.  She is able to open and roll closed and written materials provided today as well.  Enrolled patient in [employer] DC program: Yes WOC team will follow and remain available to patient, medical and nursing teams.  Maple HudsonKaren Mickaela Starlin RN BSN CWON Pager [phone]

## 2018-03-01 ENCOUNTER — Inpatient Hospital Stay: Payer: Medicare HMO

## 2018-03-01 ENCOUNTER — Inpatient Hospital Stay: Admit: 2018-03-01 | Payer: Medicare HMO

## 2018-03-01 DIAGNOSIS — J810 Acute pulmonary edema: Secondary | ICD-10-CM

## 2018-03-01 DIAGNOSIS — J9811 Atelectasis: Secondary | ICD-10-CM

## 2018-03-01 DIAGNOSIS — J181 Lobar pneumonia, unspecified organism: Secondary | ICD-10-CM

## 2018-03-01 LAB — GLUCOSE, CAPILLARY
GLUCOSE-CAPILLARY: 127 mg/dL — AB (ref 65–99)
Glucose-Capillary: 130 mg/dL — ABNORMAL HIGH (ref 65–99)
Glucose-Capillary: 76 mg/dL (ref 65–99)
Glucose-Capillary: 96 mg/dL (ref 65–99)

## 2018-03-01 LAB — BLOOD GAS, ARTERIAL
ACID-BASE EXCESS: 5.8 mmol/L — AB (ref 0.0–2.0)
BICARBONATE: 30.6 mmol/L — AB (ref 20.0–28.0)
FIO2: 1
O2 Saturation: 81.6 %
PCO2 ART: 45 mmHg (ref 32.0–48.0)
PH ART: 7.44 (ref 7.350–7.450)
PO2 ART: 44 mmHg — AB (ref 83.0–108.0)
Patient temperature: 37

## 2018-03-01 LAB — CBC
HCT: 26 % — ABNORMAL LOW (ref 35.0–47.0)
HEMOGLOBIN: 8.7 g/dL — AB (ref 12.0–16.0)
MCH: 30.8 pg (ref 26.0–34.0)
MCHC: 33.4 g/dL (ref 32.0–36.0)
MCV: 92 fL (ref 80.0–100.0)
Platelets: 715 10*3/uL — ABNORMAL HIGH (ref 150–440)
RBC: 2.82 MIL/uL — ABNORMAL LOW (ref 3.80–5.20)
RDW: 15.5 % — AB (ref 11.5–14.5)
WBC: 18.9 10*3/uL — ABNORMAL HIGH (ref 3.6–11.0)

## 2018-03-01 LAB — BASIC METABOLIC PANEL
Anion gap: 7 (ref 5–15)
CALCIUM: 7.2 mg/dL — AB (ref 8.9–10.3)
CHLORIDE: 99 mmol/L — AB (ref 101–111)
CO2: 25 mmol/L (ref 22–32)
CREATININE: 0.47 mg/dL (ref 0.44–1.00)
GFR calc non Af Amer: >60 mL/min (ref >60)
GLUCOSE: 115 mg/dL — AB (ref 65–99)
Potassium: 4.1 mmol/L (ref 3.5–5.1)
Sodium: 131 mmol/L — ABNORMAL LOW (ref 135–145)

## 2018-03-01 LAB — BRAIN NATRIURETIC PEPTIDE: B Natriuretic Peptide: 450 pg/mL — ABNORMAL HIGH (ref 0.0–100.0)

## 2018-03-01 LAB — MRSA PCR SCREENING: MRSA by PCR: NEGATIVE

## 2018-03-01 LAB — CALCIUM, IONIZED: CALCIUM, IONIZED, SERUM: 4.7 mg/dL (ref 4.5–5.6)

## 2018-03-01 LAB — PROCALCITONIN: PROCALCITONIN: 6.86 ng/mL

## 2018-03-01 MED ORDER — VANCOMYCIN HCL IN DEXTROSE 1-5 GM/200ML-% IV SOLN
1000.0000 mg | Freq: Once | INTRAVENOUS | Status: AC
  Administered 2018-03-01: 1000 mg via INTRAVENOUS
  Filled 2018-03-01: qty 200

## 2018-03-01 MED ORDER — IPRATROPIUM-ALBUTEROL 0.5-2.5 (3) MG/3ML IN SOLN: 3.0000 mL | RESPIRATORY_TRACT | Status: DC | PRN

## 2018-03-01 MED ORDER — VANCOMYCIN HCL IN DEXTROSE 750-5 MG/150ML-% IV SOLN
750.0000 mg | INTRAVENOUS | Status: DC
  Administered 2018-03-02 – 2018-03-04 (×4): 750 mg via INTRAVENOUS
  Filled 2018-03-01 (×5): qty 150

## 2018-03-01 MED ORDER — FUROSEMIDE 10 MG/ML IJ SOLN
20.0000 mg | Freq: Two times a day (BID) | INTRAMUSCULAR | Status: AC
  Administered 2018-03-01 – 2018-03-02 (×2): 20 mg via INTRAVENOUS
  Filled 2018-03-01 (×2): qty 2

## 2018-03-01 MED ORDER — FUROSEMIDE 10 MG/ML IJ SOLN
40.0000 mg | Freq: Once | INTRAMUSCULAR | Status: AC
  Administered 2018-03-01: 40 mg via INTRAVENOUS
  Filled 2018-03-01: qty 4

## 2018-03-01 MED ORDER — IOPAMIDOL (ISOVUE-370) INJECTION 76%
75.0000 mL | Freq: Once | INTRAVENOUS | Status: AC | PRN
  Administered 2018-03-01: 75 mL via INTRAVENOUS

## 2018-03-01 MED ORDER — IPRATROPIUM-ALBUTEROL 0.5-2.5 (3) MG/3ML IN SOLN
3.0000 mL | Freq: Four times a day (QID) | RESPIRATORY_TRACT | Status: DC
  Administered 2018-03-01: 3 mL via RESPIRATORY_TRACT
  Filled 2018-03-01: qty 3

## 2018-03-01 NOTE — Progress Notes (Signed)
Discussed with nursing.  Nursing and called prime doc who suggested transferring the patient to the ICU for increased respiratory rate and tachycardia.  She is also requiring considerable amounts of supplemental oxygen.  I discussed that with the patient she was in full agreement.

## 2018-03-01 NOTE — Consult Note (Signed)
[facility] [place] [facility]   PATIENT NAME: Annette Miller    MR#:  [id number]  DATE OF BIRTH:  1950-12-06  DATE OF ADMISSION:  02/25/2018  PRIMARY CARE PHYSICIAN: Steele Sizerrissman, Mark A, MD   REQUESTING/REFERRING PHYSICIAN: Satira MccallumJason Davis MD  CHIEF COMPLAINT:   Chief Complaint  Patient presents with  . Abdominal Pain    HISTORY OF PRESENT ILLNESS: Annette Miller  is a 67 y.o. female with a known history of COPD, allergy, depression, goiter, hyperlipidemia, hypothyroidism who was admitted on 5/31 with peritonitis and perforated sigmoid colon.  Patient underwent laparotomy, Hartman's procedure, appendectomy and colostomy.  Patient since yesterday has developed acute respiratory distress and now requiring nonrebreather.  Chest x-ray on admission was noted to have some atelectasis but no other significant abnormality.  Now CT scan of the chest shows bilateral diffuse infiltrate consistent with possible CHF, pneumonia or ARDS. She complains of shortness of breath.  But no wheezing denies any chest pains or palpitations.  Complains of some swelling in the lower extremity. Continues to have significant abdominal pain.  PAST MEDICAL HISTORY:   Past Medical History:  Diagnosis Date  . Allergy   . Anemia   . Arthritis   . Depression   . Goiter   . Hyperlipidemia   . Hypothyroidism   . Osteoporosis     PAST SURGICAL HISTORY:  Past Surgical History:  Procedure Laterality Date  . ABDOMINAL HYSTERECTOMY    . COLONOSCOPY WITH PROPOFOL N/A 01/14/2018   Procedure: COLONOSCOPY WITH PROPOFOL;  Surgeon: Toney ReilVanga, Rohini Reddy, MD;  Location[facility] ENDOSCOPY;  Service: Gastroenterology;  Laterality: N/A;  . ESOPHAGOGASTRODUODENOSCOPY (EGD) WITH PROPOFOL N/A 01/14/2018   Procedure: ESOPHAGOGASTRODUODENOSCOPY (EGD) WITH PROPOFOL;  Surgeon: Toney ReilVanga, Rohini Reddy, MD;  Location[facility] ENDOSCOPY;  Service: Gastroenterology;  Laterality: N/A;  . LAPAROTOMY N/A 02/25/2018   Procedure: EXPLORATORY  LAPAROTOMY, PARTMAN'S PROCEDURE, APPENDECTOMY, COLOSTOMY;  Surgeon: Star AgeKaplin, Aviva W, DO;  Location: [facility] ORS;  Service: General;  Laterality: N/A;  . TONSILLECTOMY      SOCIAL HISTORY:  Social History   Tobacco Use  . Smoking status: Current Every Day Smoker    Packs/day: 0.10    Types: Cigarettes  . Smokeless tobacco: Never Used  Substance Use Topics  . Alcohol use: No    FAMILY HISTORY:  Family History  Problem Relation Age of Onset  . Cancer Mother   . AAA (abdominal aortic aneurysm) Father   . Cancer Maternal Grandmother   . Stroke Maternal Grandmother   . Cancer Paternal Grandfather     DRUG ALLERGIES:  Allergies  Allergen Reactions  . Codeine Diarrhea and Nausea And Vomiting  . Prednisone Other (See Comments)    Reaction: violence    REVIEW OF SYSTEMS:   CONSTITUTIONAL: No fever, fatigue or weakness.  EYES: No blurred or double vision.  EARS, NOSE, AND THROAT: No tinnitus or ear pain.  RESPIRATORY: No cough, positive shortness of breath, no wheezing or hemoptysis.  CARDIOVASCULAR: No chest pain, orthopnea, positive edema.  GASTROINTESTINAL: No nausea, vomiting, diarrhea or abdominal pain.  GENITOURINARY: No dysuria, hematuria.  ENDOCRINE: No polyuria, nocturia,  HEMATOLOGY: No anemia, easy bruising or bleeding SKIN: No rash or lesion. MUSCULOSKELETAL: No joint pain or arthritis.   NEUROLOGIC: No tingling, numbness, weakness.  PSYCHIATRY: No anxiety or depression.   MEDICATIONS AT HOME:  Prior to Admission medications   Medication Sig Start Date End Date Taking? Authorizing Provider  acetaminophen (TYLENOL) 325 MG tablet Take 650 mg by mouth every  6 (six) hours as needed for mild pain or fever.    [provider]  Ascorbic Acid (VITAMIN C) 1000 MG tablet Take 3,000 mg by mouth daily.    [provider]  buPROPion (WELLBUTRIN SR) 150 MG 12 hr tablet TAKE 1 TABLET (150 MG TOTAL) BY MOUTH 2 (TWO) TIMES DAILY. Patient taking differently: Take  150 mg by mouth daily.  06/14/17   Johnson, Megan P, DO  Calcium Carb-Cholecalciferol (OYSTER SHELL CALCIUM PLUS D PO) Take 1 tablet by mouth daily.     [provider]  diazepam (VALIUM) 5 MG tablet TAKE 1 TABLET BY MOUTH TWICE A DAY 11/04/17   Steele Sizer, MD  fluticasone (FLONASE) 50 MCG/ACT nasal spray Place 2 sprays into both nostrils daily. Patient not taking: Reported on 01/14/2018 05/20/17   Steele Sizer, MD  levothyroxine (SYNTHROID, LEVOTHROID) 25 MCG tablet Take 1 tablet (25 mcg total) by mouth daily. 05/20/17   Steele Sizer, MD  loratadine (CLARITIN) 10 MG tablet Take 1 tablet (10 mg total) by mouth daily. 05/20/17   Steele Sizer, MD  magnesium 30 MG tablet Take 30 mg by mouth daily.     [provider]  meloxicam (MOBIC) 15 MG tablet Take 1 tablet (15 mg total) by mouth daily. 12/31/17   Johnson, Megan P, DO  ondansetron (ZOFRAN ODT) 4 MG disintegrating tablet Take 1 tablet (4 mg total) by mouth every 8 (eight) hours as needed for nausea or vomiting. 12/09/17   Darci Current, MD  ondansetron (ZOFRAN) 4 MG tablet TAKE 1 TABLET BY MOUTH EVERY 8 HOURS AS NEEDED FOR NAUSEA AND VOMITING 02/25/18   Gabriel Cirri, NP  pantoprazole (PROTONIX) 40 MG tablet Take 1 tablet (40 mg total) by mouth 2 (two) times daily. 12/15/17   Steele Sizer, MD  potassium chloride (K-DUR) 10 MEQ tablet Take 4 tablets (40 mEq total) by mouth daily for 2 days. 01/13/18 01/15/18  Toney Reil, MD  rosuvastatin (CRESTOR) 10 MG tablet Take 1 tablet (10 mg total) by mouth daily. 05/20/17   Steele Sizer, MD  Selenium 200 MCG CAPS Take by mouth daily.    [provider]  sucralfate (CARAFATE) 1 g tablet Take 1 tablet (1 g total) by mouth 2 (two) times daily. 12/09/17 01/08/18  Darci Current, MD  vitamin E 1000 UNIT capsule Take 1,000 Units by mouth 2 (two) times daily.    [provider]  Zinc Sulfate (ZINC 15 PO) Take by mouth daily.    [provider]       PHYSICAL EXAMINATION:   VITAL SIGNS: Blood pressure 131/78, pulse (!) 125, temperature 99 F (37.2 C), temperature source Oral, resp. rate 18, height 5\' 2"  (1.575 m), weight 45.4 kg (100 lb), SpO2 95 %.  GENERAL:  67 y.o.-year-old patient lying in the bed with mild acute distress EYES: Pupils equal, round, reactive to light and accommodation. No scleral icterus. Extraocular muscles intact.  HEENT: Head atraumatic, normocephalic. Oropharynx and nasopharynx clear.  NECK:  Supple, no jugular venous distention. No thyroid enlargement, no tenderness.  LUNGS: Bilateral crackles in both lungs, with some accessory muscle usage CARDIOVASCULAR: S1, S2 normal. No murmurs, rubs, or gallops.  ABDOMEN: Postop changes EXTREMITIES: No pedal edema, cyanosis, or clubbing.  NEUROLOGIC: Cranial nerves II through XII are intact. Muscle strength 5/5 in all extremities. Sensation intact. Gait not checked.  PSYCHIATRIC: The patient is alert and oriented x 3.  SKIN: No obvious rash, lesion,  or ulcer.   LABORATORY PANEL:   CBC Recent Labs  Lab 02/25/18 0323 02/25/18 1440 02/26/18 0413 02/27/18 0804 02/28/18 0512 03/01/18 0626  WBC 8.5 6.5 7.8 10.2 16.9* 18.9*  HGB 10.4* 8.2* 7.4* 8.3* 8.4* 8.7*  HCT 30.0* 23.6* 22.6* 24.1* 24.8* 26.0*  PLT 1,080* 698* 740* 622* 690* 715*  MCV 95.3 96.3 95.1 92.5 91.5 92.0  MCH 33.1 33.4 31.2 31.9 30.8 30.8  MCHC 34.8 34.6 32.8 34.5 33.7 33.4  RDW 14.3 14.0 14.6* 16.2* 15.9* 15.5*  LYMPHSABS 1.4 0.5* 0.9* 1.1  --   --   MONOABS 0.2 0.2 0.3 0.4  --   --   EOSABS 0.0 0.0 0.0 0.1  --   --   BASOSABS 0.0 0.0 0.0 0.0  --   --    ------------------------------------------------------------------------------------------------------------------  Chemistries  Recent Labs  Lab 02/25/18 0323 02/25/18 1440 02/26/18 0413 02/27/18 0804 02/28/18 0512 03/01/18 0626  NA 136 131* 133* 134* 132* 131*  K 3.2* 3.2* 3.8 4.0 3.9 4.1  CL 93* 101 103 104 101 99*  CO2 26  20* 24 24 25 25   GLUCOSE 96 110* 133* 94 121* 115*  BUN 18 14 12 7  <5* <5*  CREATININE 0.99 0.53 0.61 0.52 0.50 0.47  CALCIUM 8.9 6.9* 7.1* 7.4* 7.2* 7.2*  MG  --   --  1.7 1.9  --   --   AST 35  --  24 21  --   --   ALT 10*  --  11* 11*  --   --   ALKPHOS 107  --  68 74  --   --   BILITOT 0.5  --  0.2* 0.7  --   --    ------------------------------------------------------------------------------------------------------------------ estimated creatinine clearance is 49.6 mL/min (by C-G formula based on SCr of 0.47 mg/dL). ------------------------------------------------------------------------------------------------------------------ No results for input(s): TSH, T4TOTAL, T3FREE, THYROIDAB in the last 72 hours.  Invalid input(s): FREET3   Coagulation profile No results for input(s): INR, PROTIME in the last 168 hours. ------------------------------------------------------------------------------------------------------------------- No results for input(s): DDIMER in the last 72 hours. -------------------------------------------------------------------------------------------------------------------  Cardiac Enzymes No results for input(s): CKMB, TROPONINI, MYOGLOBIN in the last 168 hours.  Invalid input(s): CK ------------------------------------------------------------------------------------------------------------------ Invalid input(s): POCBNP  ---------------------------------------------------------------------------------------------------------------  Urinalysis    Component Value Date/Time   APPEARANCEUR Clear 05/20/2017 1023   GLUCOSEU Negative 05/20/2017 1023   BILIRUBINUR Negative 05/20/2017 1023   PROTEINUR 1+ (A) 05/20/2017 1023   NITRITE Negative 05/20/2017 1023   LEUKOCYTESUR Negative 05/20/2017 1023     RADIOLOGY: Ct Angio Chest Pe W Or Wo Contrast  Result Date: 03/01/2018 CLINICAL DATA:  Desaturation, chest tightness. EXAM: CT ANGIOGRAPHY CHEST WITH  CONTRAST TECHNIQUE: Multidetector CT imaging of the chest was performed using the standard protocol during bolus administration of intravenous contrast. Multiplanar CT image reconstructions and MIPs were obtained to evaluate the vascular anatomy. CONTRAST:  75mL ISOVUE-370 IOPAMIDOL (ISOVUE-370) INJECTION 76% COMPARISON:  Chest x-ray from earlier same day. FINDINGS: Cardiovascular: There is no pulmonary embolism identified within the main, lobar or segmental pulmonary arteries bilaterally. No thoracic aortic aneurysm or evidence of aortic dissection. Mild aortic atherosclerosis. Mild cardiomegaly. No significant pericardial effusion. Mediastinum/Nodes: No mass or enlarged lymph nodes seen within the mediastinum or perihilar regions. Esophagus appears normal. Trachea and central bronchi are unremarkable. Lungs/Pleura: Diffuse ground-glass opacities throughout both lungs, mid and upper lobe predominant. Denser consolidation within the superior segment of the RIGHT lower lobe, highly suspicious for pneumonia. Small RIGHT pleural effusion. No pneumothorax. Upper Abdomen:  No acute findings within the upper abdomen. Musculoskeletal: No acute or suspicious osseous finding. RIGHT humeral head/neck fracture, incompletely imaged, presumably chronic. Mild degenerative change within the thoracic spine. Review of the MIP images confirms the above findings. IMPRESSION: 1. Diffuse ground-glass opacities throughout both lungs, mid and upper lung predominant. Favor pulmonary edema. Differential includes atypical pneumonias such as viral or fungal, interstitial pneumonias, edema related to volume overload/CHF, chronic interstitial diseases, hypersensitivity pneumonitis, and respiratory bronchiolitis. 2. Probable RIGHT lower lobe pneumonia (superior segment of the RIGHT lower lobe). 3. Small RIGHT pleural effusion. 4. No pulmonary embolism. 5. RIGHT humeral head/neck fracture, incompletely imaged, presumably chronic. No previous  x-rays to assess acuity. 6. Mild aortic atherosclerosis. Aortic Atherosclerosis (ICD10-I70.0). These results will be called to the ordering clinician or representative by the Radiologist Assistant, and communication documented in the PACS or zVision Dashboard. Electronically Signed   By: Bary Richard M.D.   On: 03/01/2018 09:13   Dg Chest Port 1 View  Result Date: 03/01/2018 CLINICAL DATA:  Acute onset of respiratory distress. Patient in abdominal surgery. EXAM: PORTABLE CHEST 1 VIEW COMPARISON:  Chest radiograph performed 02/26/2018 FINDINGS: Diffuse bilateral airspace opacification is new from the prior study and may reflect flash pulmonary edema or possibly pneumonia. ARDS cannot be excluded. A small right pleural effusion is noted. No pneumothorax is seen The cardiomediastinal silhouette is normal in size. A displaced right humeral head and neck fracture is noted. The patient's right PICC is noted ending about the mid SVC. IMPRESSION: 1. Diffuse bilateral airspace opacification is new from the prior study and may reflect flash pulmonary edema or possibly pneumonia. ARDS cannot be excluded. Small right pleural effusion noted. 2. Displaced right humeral head and neck fracture noted. Electronically Signed   By: Roanna Raider M.D.   On: 03/01/2018 04:42    EKG: Orders placed or performed during the hospital encounter of 02/25/18  . EKG 12-Lead  . EKG 12-Lead  . EKG 12-Lead  . EKG 12-Lead    IMPRESSION AND PLAN: Patient is a 67 year old lady with history of COPD, chronic tobacco abuse status post surgery noted to have acute respiratory failure   1.  Acute hypoxic respiratory failure differential diagnosis based on the CT scan includes Possible bilateral pneumonia, possible ARDS, possible CHF I will check procalcitonin level stat, BNP level stat Add vancomycin to current regimen Place patient on nebulizer therapy Pulmonary consult Check lactic acid level  2.  Sinus tachycardia likely due to  #1  3.  Hypothyroidism continue Synthroid  4.  GERD continue Protonix  5.  Hyperlipidemia continue Crestor  6.  Nicotine abuse smoking cessation provided 4 minutes spent, strongly recommended to stop smoking  All the records are reviewed and case discussed with ED provider. Management plans discussed with the patient, family and they are in agreement.  CODE STATUS:    Code Status Orders  (From admission, onward)        Start     Ordered   02/25/18 1317  Full code  Continuous     02/25/18 1326    Code Status History    This patient has a current code status but no historical code status.    Advance Directive Documentation     Most Recent Value  Type of Advance Directive  Healthcare Power of Attorney, Living will  Pre-existing out of facility DNR order (yellow form or pink MOST form)  -  "MOST" Form in Place?  -       TOTAL  TIME TAKING CARE OF THIS PATIENT:55 minutes.    Auburn Bilberry M.D on 03/01/2018 at 9:55 AM  Between 7am to 6pm - Pager - [phone]  After 6pm go to [email or web address] - password [email or web address]  [employer] [facility] Office  [phone]  CC: Primary care physician; Steele Sizer, MD

## 2018-03-01 NOTE — Progress Notes (Signed)
Pt continues to c/o pain. Sats maintain at 97 on 15 Liters nonrebreather. Pt given PRN medications per MAR. CN aware.

## 2018-03-01 NOTE — Progress Notes (Signed)
Called to bedside by CNA. Pt noted to have Sats in 70's. O2 increased to 6liters with no effect. Placed pt on nonrebreather @15  liters pt O2 sat increased to 92%. MD notified, Resp notified. CXR STAT and EKG ordered. Pt denied SOB. Increased work of breathing noted. Pt placed on continuous O2 monitor.

## 2018-03-01 NOTE — Progress Notes (Signed)
4 Days Post-Op  Subjective: Events of last night noted.  Appreciate prime doc assistance. Patient feels better this afternoon.  She wants to eat.  Is no nausea or vomiting.  Objective: Vital signs in last 24 hours: Temp:  [98.6 F (37 C)-99 F (37.2 C)] 98.7 F (37.1 C) (06/04 1202) Pulse Rate:  [107-125] 107 (06/04 1202) Resp:  [17-18] 17 (06/04 1202) BP: (98-131)/(63-78) 120/65 (06/04 1202) SpO2:  [70 %-96 %] 96 % (06/04 1202) Last BM Date: 02/28/18  Intake/Output from previous day: 06/03 0701 - 06/04 0700 In: 2351.7 [P.O.:360; I.V.:1781.7; IV Piggyback:100] Out: 2305 [Urine:1915; Drains:370; Stool:20] Intake/Output this shift: Total I/O In: 250 [IV Piggyback:250] Out: 90 [Drains:90]  Physical exam:  Comfortable appearing female patient vital signs are stable although slightly tachycardic.  Abdomen is soft nondistended nontympanitic and nontender wound is clean and granulating.  No ostomy function yet and drain is serous only  Lab Results: CBC  Recent Labs    02/28/18 0512 03/01/18 0626  WBC 16.9* 18.9*  HGB 8.4* 8.7*  HCT 24.8* 26.0*  PLT 690* 715*   BMET Recent Labs    02/28/18 0512 03/01/18 0626  NA 132* 131*  K 3.9 4.1  CL 101 99*  CO2 25 25  GLUCOSE 121* 115*  BUN <5* <5*  CREATININE 0.50 0.47  CALCIUM 7.2* 7.2*   PT/INR No results for input(s): LABPROT, INR in the last 72 hours. ABG No results for input(s): PHART, HCO3 in the last 72 hours.  Invalid input(s): PCO2, PO2  Studies/Results: Ct Angio Chest Pe W Or Wo Contrast  Result Date: 03/01/2018 CLINICAL DATA:  Desaturation, chest tightness. EXAM: CT ANGIOGRAPHY CHEST WITH CONTRAST TECHNIQUE: Multidetector CT imaging of the chest was performed using the standard protocol during bolus administration of intravenous contrast. Multiplanar CT image reconstructions and MIPs were obtained to evaluate the vascular anatomy. CONTRAST:  75mL ISOVUE-370 IOPAMIDOL (ISOVUE-370) INJECTION 76% COMPARISON:   Chest x-ray from earlier same day. FINDINGS: Cardiovascular: There is no pulmonary embolism identified within the main, lobar or segmental pulmonary arteries bilaterally. No thoracic aortic aneurysm or evidence of aortic dissection. Mild aortic atherosclerosis. Mild cardiomegaly. No significant pericardial effusion. Mediastinum/Nodes: No mass or enlarged lymph nodes seen within the mediastinum or perihilar regions. Esophagus appears normal. Trachea and central bronchi are unremarkable. Lungs/Pleura: Diffuse ground-glass opacities throughout both lungs, mid and upper lobe predominant. Denser consolidation within the superior segment of the RIGHT lower lobe, highly suspicious for pneumonia. Small RIGHT pleural effusion. No pneumothorax. Upper Abdomen: No acute findings within the upper abdomen. Musculoskeletal: No acute or suspicious osseous finding. RIGHT humeral head/neck fracture, incompletely imaged, presumably chronic. Mild degenerative change within the thoracic spine. Review of the MIP images confirms the above findings. IMPRESSION: 1. Diffuse ground-glass opacities throughout both lungs, mid and upper lung predominant. Favor pulmonary edema. Differential includes atypical pneumonias such as viral or fungal, interstitial pneumonias, edema related to volume overload/CHF, chronic interstitial diseases, hypersensitivity pneumonitis, and respiratory bronchiolitis. 2. Probable RIGHT lower lobe pneumonia (superior segment of the RIGHT lower lobe). 3. Small RIGHT pleural effusion. 4. No pulmonary embolism. 5. RIGHT humeral head/neck fracture, incompletely imaged, presumably chronic. No previous x-rays to assess acuity. 6. Mild aortic atherosclerosis. Aortic Atherosclerosis (ICD10-I70.0). These results will be called to the ordering clinician or representative by the Radiologist Assistant, and communication documented in the PACS or zVision Dashboard. Electronically Signed   By: Bary  M.D.   On: 03/01/2018  09:13   Dg Chest Port 1 View  Result Date:  03/01/2018 CLINICAL DATA:  Acute onset of respiratory distress. Patient in abdominal surgery. EXAM: PORTABLE CHEST 1 VIEW COMPARISON:  Chest radiograph performed 02/26/2018 FINDINGS: Diffuse bilateral airspace opacification is new from the prior study and may reflect flash pulmonary edema or possibly pneumonia. ARDS cannot be excluded. A small right pleural effusion is noted. No pneumothorax is seen The cardiomediastinal silhouette is normal in size. A displaced right humeral head and neck fracture is noted. The patient's right PICC is noted ending about the mid SVC. IMPRESSION: 1. Diffuse bilateral airspace opacification is new from the prior study and may reflect flash pulmonary edema or possibly pneumonia. ARDS cannot be excluded. Small right pleural effusion noted. 2. Displaced right humeral head and neck fracture noted. Electronically Signed   By: Roanna RaiderJeffery  Chang M.D.   On: 03/01/2018 04:42    Anti-infectives: Anti-infectives (From admission, onward)   Start     Dose/Rate Route Frequency Ordered Stop   03/02/18 0400  vancomycin (VANCOCIN) IVPB 750 mg/150 ml premix     750 mg 150 mL/hr over 60 Minutes Intravenous Every 18 hours 03/01/18 1014     03/01/18 1015  vancomycin (VANCOCIN) IVPB 1000 mg/200 mL premix     1,000 mg 200 mL/hr over 60 Minutes Intravenous  Once 03/01/18 1012 03/01/18 1206   02/25/18 1600  piperacillin-tazobactam (ZOSYN) IVPB 3.375 g     3.375 g 12.5 mL/hr over 240 Minutes Intravenous Every 8 hours 02/25/18 1326     02/25/18 0645  vancomycin (VANCOCIN) IVPB 1000 mg/200 mL premix     1,000 mg 200 mL/hr over 60 Minutes Intravenous  Once 02/25/18 0639 02/25/18 0814   02/25/18 0630  piperacillin-tazobactam (ZOSYN) IVPB 3.375 g     3.375 g 100 mL/hr over 30 Minutes Intravenous  Once 02/25/18 0619 02/25/18 0715   02/25/18 0630  vancomycin (VANCOCIN) injection 1 g  Status:  Discontinued     1 g Intravenous  Once 02/25/18 [id number] 02/25/18  [id number]      Assessment/Plan: s/p Procedure(s): EXPLORATORY LAPAROTOMY, PARTMAN'S PROCEDURE, APPENDECTOMY, COLOSTOMY   Chest x-ray is reviewed and CT scan suggest no sign of pulmonary embolism.  Appreciate prime doc assistance with the patient's volume status and pulmonary edema as well as a possible pneumonia.  Her white blood cell count is elevated.  We will continue to follow.  Awaiting bowel function to advance diet.Lattie Haw*  Crescentia Boutwell E Orvella Digiulio, MD, FACS  03/01/2018

## 2018-03-01 NOTE — Progress Notes (Signed)
Addendum: RN reports that the patient stated that she had a fall back in January in which she fractured her arm.  The noted fracture on x-ray today was not noted on her chest x-ray 3 days ago where the right shoulder was not included in the film adequately.  Patient states that she had this taken care of and looked at at that time and has no problems at this time.  I cannot find any record of x-rays being performed therefore it might have been done at an outside hospital or facility.

## 2018-03-01 NOTE — Progress Notes (Signed)
Discussed with Dr. Copper about pts arm fracture that was shown on xray. Pt stated she fell in January and was aware she had a fracture and was seen by Dr. Truitt MerleNotified cooper. Also notified Copper of pt bladder scan showing over 400. Verbal orders were given to place foley. Orders will be put in.

## 2018-03-01 NOTE — Progress Notes (Addendum)
ADDENDUM: Chest x-ray personally reviewed, confirmed with radiology. 1. Diffuse bilateral airspace opacification is new from the prior study and may reflect flash pulmonary edema or possibly pneumonia. ARDS cannot be excluded. Small right pleural effusion noted.   - 40 mg IV Lasix ordered  -- [name] E. Earlene Plateravis, MD, RPVI [place]: [facility] General Surgery - Partnering for exceptional care. Office: [phone]   Called to bedside for desaturation to 70%'s, improved to 92% with non-rebreather mask. Patient initially denied any SOB or CP, then momentarily described her chest felt "a little tight", which then again resolved without SOB. Lungs B/L CTA and confirmed patient has been receiving DVT prophylaxis, denies cough or fever/chills with mild abdominal pain, denies N/V.  BP 131/78 (BP Location: Left Arm)   Pulse (!) 125   Temp 99 F (37.2 C) (Oral)   Resp 18   Ht 5\' 2"  (1.575 m)   Wt 100 lb (45.4 kg)   SpO2 92%   BMI 18.29 kg/m    CBC Latest Ref Rng & Units 02/28/2018 02/27/2018 02/26/2018  WBC 3.6 - 11.0 K/uL 16.9(H) 10.2 7.8  Hemoglobin 12.0 - 16.0 g/dL 0.9(W8.4(L) 8.3(L) 7.4(L)  Hematocrit 35.0 - 47.0 % 24.8(L) 24.1(L) 22.6(L)  Platelets 150 - 440 K/uL 690(H) 622(H) 740(H)   CMP Latest Ref Rng & Units 02/28/2018 02/27/2018 02/26/2018  Glucose 65 - 99 mg/dL 119(J121(H) 94 478(G133(H)  BUN 6 - 20 mg/dL <9(F<5(L) 7 12  Creatinine 0.44 - 1.00 mg/dL 6.210.50 3.080.52 6.570.61  Sodium 135 - 145 mmol/L 132(L) 134(L) 133(L)  Potassium 3.5 - 5.1 mmol/L 3.9 4.0 3.8  Chloride 101 - 111 mmol/L 101 104 103  CO2 22 - 32 mmol/L 25 24 24   Calcium 8.9 - 10.3 mg/dL 7.2(L) 7.4(L) 7.1(L)  Total Protein 6.5 - 8.1 g/dL - 5.2(L) 4.6(L)  Total Bilirubin 0.3 - 1.2 mg/dL - 0.7 8.4(O0.2(L)  Alkaline Phos 38 - 126 U/L - 74 68  AST 15 - 41 U/L - 21 24  ALT 14 - 54 U/L - 11(L) 11(L)   Assessment/Plan: 67 y.o.femalewith acute desaturation and tachycardia throughout her post-surgical recovery, post-surgical ileus and  improved acute on chronic anemia and normotensive, 4 Days Post-Ops/p Hartmann's partial colectomy with end-colostomy and rectal stumpfor perforated sigmoid colonic diverticulitis with feculent peritonitis and sepsis, complicated by pertinent comorbidities includingchronic severe malnutrition, anemia, HLD, hypothyroidism, osteoarthritis, and major depression disorder.   - monitor O2 and VS - check stat portable chest x-ray and EKG - if both are unrevealing, check CTA chest for PE             - may transfer to stepdown if doesn't improve - medical management of medical comorbidities - DVT prophylaxis, ambulation encouraged  All of the above findings and recommendations were discussed with the patient, and all of patient's questions were answered to her expressed satisfaction.  -- Scherrie GerlachJason E. Earlene Plateravis, MD, RPVI [place]: [facility] General Surgery - Partnering for exceptional care. Office: [phone]

## 2018-03-01 NOTE — Care Management (Signed)
Patient currently requiring acute O2. 15 liters non rebreather.  Ostomy nurse consulting and following patient.  PT consult requested when patient appropriate.  RNCM following for discharge planning needs.

## 2018-03-01 NOTE — Consult Note (Signed)
[facility] Pulmonary Medicine Consultation      Assessment and Plan:  Acute hypoxic respiratory failure. - Suspect acute pulmonary edema, complicating healthcare associated pneumonia.  Other possibilities such as a transfusion associated lung disease appear less likely.  - Continue Lasix, will continue 20 mg twice daily for 2 more doses. -Agree with broad-spectrum coverage for H CAP, will re-check MRSA screen.  Date: 03/01/2018  MRN# [id number] Annette Miller 05-31-1951  Referring Physician: Dr. Allena Katz.   Annette Miller is a 67 y.o. old female seen in consultation for chief complaint of:    Chief Complaint  Patient presents with  . Abdominal Pain    HPI:  The patient is a 67 year old female she has a history of COPD, tobacco abuse, duodenal ulcer gastritis diverticulosis, depression, goiter. Patient arrived to the ED on 02/25/2018 abdominal pain, 36 pound weight loss in the past month, she underwent CT of the abdomen showing free air.  The patient was taken to the OR on same day, underwent ex lap with appendectomy, Hartmann sigmoidectomy and colostomy.  Patient was subsequently transferred to the ICU. Postoperatively the patient required a blood transfusion she had mild tachycardia.  The patient was requiring 2 to 3 L oxygen over the subsequent days.  On 6/3 patient required 5 L nasal cannula, this coincided with development of fever with T-max of 38.3 on early morning 6/3.  Subsequently on the morning of 6/4 patient was requiring nonrebreather.  Chest x-ray and CT angios were obtained images personally reviewed, there is a diffuse groundglass changes, suggestive of pulmonary edema.  Right lower lobe atelectasis versus consolidation.  She was given a dose of Lasix 40 mg IV this morning.  PMHX:   Past Medical History:  Diagnosis Date  . Allergy   . Anemia   . Arthritis   . Depression   . Goiter   . Hyperlipidemia   . Hypothyroidism   . Osteoporosis    Surgical Hx:  Past  Surgical History:  Procedure Laterality Date  . ABDOMINAL HYSTERECTOMY    . COLONOSCOPY WITH PROPOFOL N/A 01/14/2018   Procedure: COLONOSCOPY WITH PROPOFOL;  Surgeon: Toney Reil, MD;  Location[facility] ENDOSCOPY;  Service: Gastroenterology;  Laterality: N/A;  . ESOPHAGOGASTRODUODENOSCOPY (EGD) WITH PROPOFOL N/A 01/14/2018   Procedure: ESOPHAGOGASTRODUODENOSCOPY (EGD) WITH PROPOFOL;  Surgeon: Toney Reil, MD;  Location: [facility] ENDOSCOPY;  Service: Gastroenterology;  Laterality: N/A;  . LAPAROTOMY N/A 02/25/2018   Procedure: EXPLORATORY LAPAROTOMY, PARTMAN'S PROCEDURE, APPENDECTOMY, COLOSTOMY;  Surgeon: Star Age, DO;  Location: [facility] ORS;  Service: General;  Laterality: N/A;  . TONSILLECTOMY     Family Hx:  Family History  Problem Relation Age of Onset  . Cancer Mother   . AAA (abdominal aortic aneurysm) Father   . Cancer Maternal Grandmother   . Stroke Maternal Grandmother   . Cancer Paternal Grandfather    Social Hx:   Social History   Tobacco Use  . Smoking status: Current Every Day Smoker    Packs/day: 0.10    Types: Cigarettes  . Smokeless tobacco: Never Used  Substance Use Topics  . Alcohol use: No  . Drug use: No   Medication:    Current Facility-Administered Medications:  .  acetaminophen (TYLENOL) tablet 650 mg, 650 mg, Oral, Q6H PRN, Ancil Linsey, MD, 650 mg at 02/28/18 1033 .  enoxaparin (LOVENOX) injection 40 mg, 40 mg, Subcutaneous, Q24H, Kaplin, Aviva W, DO, 40 mg at 02/28/18 2058 .  ipratropium-albuterol (DUONEB) 0.5-2.5 (3) MG/3ML nebulizer solution  3 mL, 3 mL, Nebulization, Q6H, Auburn Bilberry, MD .  ketorolac (TORADOL) 15 MG/ML injection 15 mg, 15 mg, Intravenous, Q6H, Ancil Linsey, MD, 15 mg at 03/01/18 1109 .  levothyroxine (SYNTHROID, LEVOTHROID) tablet 25 mcg, 25 mcg, Oral, QAC breakfast, Kaplin, Aviva W, DO, 25 mcg at 03/01/18 0802 .  MEDLINE mouth rinse, 15 mL, Mouth Rinse, BID, Kaplin, Aviva W, DO, 15 mL at 02/27/18 1037 .   morphine 2 MG/ML injection 2 mg, 2 mg, Intravenous, Q3H PRN, Ancil Linsey, MD, 2 mg at 03/01/18 0552 .  ondansetron (ZOFRAN-ODT) disintegrating tablet 4 mg, 4 mg, Oral, Q6H PRN, 4 mg at 03/01/18 0316 **OR** ondansetron (ZOFRAN) injection 4 mg, 4 mg, Intravenous, Q6H PRN, Kaplin, Aviva W, DO, 4 mg at 03/01/18 0938 .  oxyCODONE (Oxy IR/ROXICODONE) immediate release tablet 5-10 mg, 5-10 mg, Oral, Q4H PRN, Ancil Linsey, MD, 10 mg at 03/01/18 0935 .  piperacillin-tazobactam (ZOSYN) IVPB 3.375 g, 3.375 g, Intravenous, Q8H, Kaplin, Aviva W, DO, Stopped at 03/01/18 1206 .  sodium chloride flush (NS) 0.9 % injection 10-40 mL, 10-40 mL, Intracatheter, Q12H, Kaplin, Aviva W, DO, 10 mL at 03/01/18 0914 .  sodium chloride flush (NS) 0.9 % injection 10-40 mL, 10-40 mL, Intracatheter, PRN, Kaplin, Aviva W, DO, 20 mL at 02/28/18 2100 .  [START ON 03/02/2018] vancomycin (VANCOCIN) IVPB 750 mg/150 ml premix, 750 mg, Intravenous, Q18H, Kaplin, Aviva W, DO   Allergies:  Codeine and Prednisone  Review of Systems: Gen:  Denies  fever, sweats, chills HEENT: Denies blurred vision, double vision. bleeds, sore throat Cvc:  No dizziness, chest pain. Resp:   Denies cough or sputum production, shortness of breath Gi: Denies swallowing difficulty, stomach pain. Gu:  Denies bladder incontinence, burning urine Ext:   No Joint pain, stiffness. Skin: No skin rash,  hives  Endoc:  No polyuria, polydipsia. Psych: No depression, insomnia. Other:  All other systems were reviewed with the patient and were negative other that what is mentioned in the HPI.   Physical Examination:   VS: BP 120/65 (BP Location: Left Arm)   Pulse (!) 107   Temp 98.7 F (37.1 C) (Oral)   Resp 17   Ht 5\' 2"  (1.575 m)   Wt 100 lb (45.4 kg)   SpO2 96%   BMI 18.29 kg/m   General Appearance: No distress  Neuro:without focal findings,  speech normal,  HEENT: PERRLA, EOM intact.  Elevated JVD Pulmonary: Bilateral coarse  rhonchi CardiovascularNormal S1,S2.  No m/r/g.   Abdomen: Benign, Soft, non-tender. Renal:  No costovertebral tenderness  GU:  No performed at this time. Endoc: No evident thyromegaly, no signs of acromegaly. Skin:   warm, no rashes, no ecchymosis  Extremities: normal, no cyanosis, clubbing.  Other findings:    LABORATORY PANEL:   CBC Recent Labs  Lab 03/01/18 0626  WBC 18.9*  HGB 8.7*  HCT 26.0*  PLT 715*   ------------------------------------------------------------------------------------------------------------------  Chemistries  Recent Labs  Lab 02/27/18 0804  03/01/18 0626  NA 134*   < > 131*  K 4.0   < > 4.1  CL 104   < > 99*  CO2 24   < > 25  GLUCOSE 94   < > 115*  BUN 7   < > <5*  CREATININE 0.52   < > 0.47  CALCIUM 7.4*   < > 7.2*  MG 1.9  --   --   AST 21  --   --   ALT 11*  --   --  ALKPHOS 74  --   --   BILITOT 0.7  --   --    < > = values in this interval not displayed.   ------------------------------------------------------------------------------------------------------------------  Cardiac Enzymes No results for input(s): TROPONINI in the last 168 hours. ------------------------------------------------------------  RADIOLOGY:  Ct Angio Chest Pe W Or Wo Contrast  Result Date: 03/01/2018 CLINICAL DATA:  Desaturation, chest tightness. EXAM: CT ANGIOGRAPHY CHEST WITH CONTRAST TECHNIQUE: Multidetector CT imaging of the chest was performed using the standard protocol during bolus administration of intravenous contrast. Multiplanar CT image reconstructions and MIPs were obtained to evaluate the vascular anatomy. CONTRAST:  75mL ISOVUE-370 IOPAMIDOL (ISOVUE-370) INJECTION 76% COMPARISON:  Chest x-ray from earlier same day. FINDINGS: Cardiovascular: There is no pulmonary embolism identified within the main, lobar or segmental pulmonary arteries bilaterally. No thoracic aortic aneurysm or evidence of aortic dissection. Mild aortic atherosclerosis. Mild  cardiomegaly. No significant pericardial effusion. Mediastinum/Nodes: No mass or enlarged lymph nodes seen within the mediastinum or perihilar regions. Esophagus appears normal. Trachea and central bronchi are unremarkable. Lungs/Pleura: Diffuse ground-glass opacities throughout both lungs, mid and upper lobe predominant. Denser consolidation within the superior segment of the RIGHT lower lobe, highly suspicious for pneumonia. Small RIGHT pleural effusion. No pneumothorax. Upper Abdomen: No acute findings within the upper abdomen. Musculoskeletal: No acute or suspicious osseous finding. RIGHT humeral head/neck fracture, incompletely imaged, presumably chronic. Mild degenerative change within the thoracic spine. Review of the MIP images confirms the above findings. IMPRESSION: 1. Diffuse ground-glass opacities throughout both lungs, mid and upper lung predominant. Favor pulmonary edema. Differential includes atypical pneumonias such as viral or fungal, interstitial pneumonias, edema related to volume overload/CHF, chronic interstitial diseases, hypersensitivity pneumonitis, and respiratory bronchiolitis. 2. Probable RIGHT lower lobe pneumonia (superior segment of the RIGHT lower lobe). 3. Small RIGHT pleural effusion. 4. No pulmonary embolism. 5. RIGHT humeral head/neck fracture, incompletely imaged, presumably chronic. No previous x-rays to assess acuity. 6. Mild aortic atherosclerosis. Aortic Atherosclerosis (ICD10-I70.0). These results will be called to the ordering clinician or representative by the Radiologist Assistant, and communication documented in the PACS or zVision Dashboard. Electronically Signed   By: Bary Richard M.D.   On: 03/01/2018 09:13   Dg Chest Port 1 View  Result Date: 03/01/2018 CLINICAL DATA:  Acute onset of respiratory distress. Patient in abdominal surgery. EXAM: PORTABLE CHEST 1 VIEW COMPARISON:  Chest radiograph performed 02/26/2018 FINDINGS: Diffuse bilateral airspace opacification  is new from the prior study and may reflect flash pulmonary edema or possibly pneumonia. ARDS cannot be excluded. A small right pleural effusion is noted. No pneumothorax is seen The cardiomediastinal silhouette is normal in size. A displaced right humeral head and neck fracture is noted. The patient's right PICC is noted ending about the mid SVC. IMPRESSION: 1. Diffuse bilateral airspace opacification is new from the prior study and may reflect flash pulmonary edema or possibly pneumonia. ARDS cannot be excluded. Small right pleural effusion noted. 2. Displaced right humeral head and neck fracture noted. Electronically Signed   By: Roanna Raider M.D.   On: 03/01/2018 04:42       Thank  you for the consultation and for allowing [facility] to assist in the care of your patient. Our recommendations are noted above.  Please contact us if we can be of further service.   Wells Guiles, MD.  Board Certified in Internal Medicine, Pulmonary Medicine, Critical Care Medicine, and Sleep Medicine.  [facility] Office Number: [phone]  Santiago Gladavid Kasa, M.D.  Billy Fischeravid Simonds, M.D  03/01/2018

## 2018-03-01 NOTE — Progress Notes (Signed)
Pharmacy Antibiotic Note  Annette Miller is a 67 y.o. female admitted on 02/25/2018 with pneumonia.  Pharmacy has been consulted for vancomycin dosing.  Plan: Vancomycin 1000 mg IV x 1 followed in approximately 17 hours (stacked dosing) by vancomycin 750 mg IV Q18H, predicted trough 19 mcg/ml. Pharmacy will continue to follow and adjust as needed to maintain trough 15 to 20 mcg/ml.   Vd 31.8 L, Ke 0.046 hr-1, T1/2 15.2 hr  Note: patient had negative MRSA PCR on 02/25/18 which usually predicts non-MRSA pneumonia (>95% of the time). CT scan shows possible b/l PNA, possible ARDS or CHF. PCT and BNP pending. CXR with diffuse b/l airspace opacification which is new and may represent flash pulmonary edema vs PNA. Additionally, 02/25/18 patient had pseudomonas isolated from abdominal fluid with rare E. Coli and has been on Zosyn.   Height: 5\' 2"  (157.5 cm) Weight: 100 lb (45.4 kg) IBW/kg (Calculated) : 50.1  Temp (24hrs), Avg:98.9 F (37.2 C), Min:98.6 F (37 C), Max:99.1 F (37.3 C)  Recent Labs  Lab 02/25/18 1440 02/26/18 0413 02/27/18 0804 02/28/18 0512 03/01/18 0626  WBC 6.5 7.8 10.2 16.9* 18.9*  CREATININE 0.53 0.61 0.52 0.50 0.47    Estimated Creatinine Clearance: 49.6 mL/min (by C-G formula based on SCr of 0.47 mg/dL).    Allergies  Allergen Reactions  . Codeine Diarrhea and Nausea And Vomiting  . Prednisone Other (See Comments)    Reaction: violence    Antimicrobials this admission: Zosyn 02/25/18 to present Vancomycin 02/25/18 x 1 dose  Dose adjustments this admission:   Microbiology results:  BCx:   UCx:    Sputum:   02/25/18 MRSA PCR: negative  Thank you for allowing pharmacy to be a part of this patient's care.  Carola FrostNathan A Holten Spano, Pharm.D., BCPS Clinical Pharmacist 03/01/2018 10:15 AM

## 2018-03-01 NOTE — Care Management Important Message (Signed)
Copy of signed IM left with patient in room.  

## 2018-03-02 ENCOUNTER — Inpatient Hospital Stay (HOSPITAL_COMMUNITY)
Admit: 2018-03-02 | Discharge: 2018-03-02 | Disposition: A | Discharge status: Home / Self Care | Payer: Medicare HMO | Attending: Internal Medicine | Admitting: Internal Medicine

## 2018-03-02 ENCOUNTER — Inpatient Hospital Stay: Payer: Medicare HMO

## 2018-03-02 DIAGNOSIS — I340 Nonrheumatic mitral (valve) insufficiency: Secondary | ICD-10-CM

## 2018-03-02 LAB — BASIC METABOLIC PANEL
ANION GAP: 7 (ref 5–15)
BUN: 6 mg/dL (ref 6–20)
CALCIUM: 7.1 mg/dL — AB (ref 8.9–10.3)
CO2: 30 mmol/L (ref 22–32)
CREATININE: 0.49 mg/dL (ref 0.44–1.00)
Chloride: 99 mmol/L — ABNORMAL LOW (ref 101–111)
Glucose, Bld: 93 mg/dL (ref 65–99)
Potassium: 3.8 mmol/L (ref 3.5–5.1)
SODIUM: 136 mmol/L (ref 135–145)

## 2018-03-02 LAB — ECHOCARDIOGRAM COMPLETE
HEIGHTINCHES: 62 in
WEIGHTICAEL: 1763.68 [oz_av]

## 2018-03-02 LAB — GLUCOSE, CAPILLARY
GLUCOSE-CAPILLARY: 87 mg/dL (ref 65–99)
GLUCOSE-CAPILLARY: 94 mg/dL (ref 65–99)
GLUCOSE-CAPILLARY: 76 mg/dL (ref 65–99)
Glucose-Capillary: 79 mg/dL (ref 65–99)

## 2018-03-02 LAB — CBC
HEMATOCRIT: 22 % — AB (ref 35.0–47.0)
Hemoglobin: 7.6 g/dL — ABNORMAL LOW (ref 12.0–16.0)
MCH: 32 pg (ref 26.0–34.0)
MCHC: 34.7 g/dL (ref 32.0–36.0)
MCV: 92 fL (ref 80.0–100.0)
PLATELETS: 505 10*3/uL — AB (ref 150–440)
RBC: 2.39 MIL/uL — ABNORMAL LOW (ref 3.80–5.20)
RDW: 15 % — AB (ref 11.5–14.5)
WBC: 9.9 10*3/uL (ref 3.6–11.0)

## 2018-03-02 LAB — AEROBIC/ANAEROBIC CULTURE W GRAM STAIN (SURGICAL/DEEP WOUND)

## 2018-03-02 LAB — AEROBIC/ANAEROBIC CULTURE (SURGICAL/DEEP WOUND)

## 2018-03-02 MED ORDER — SODIUM CHLORIDE 0.9 % IV SOLN
1.0000 g | Freq: Two times a day (BID) | INTRAVENOUS | Status: DC
  Filled 2018-03-02: qty 1

## 2018-03-02 MED ORDER — SODIUM CHLORIDE 0.9 % IV SOLN
1.0000 g | Freq: Three times a day (TID) | INTRAVENOUS | Status: DC
Start: 2018-03-02 — End: 2018-03-09
  Administered 2018-03-02 – 2018-03-09 (×21): 1 g via INTRAVENOUS
  Filled 2018-03-02 (×25): qty 1

## 2018-03-02 NOTE — Progress Notes (Signed)
Pt resting comfortably on BIPAP throughout night. Titrated down from 100% Fio2 down to 50%. When patient tried to switch to Nasal Cannula pt quickly desated into the 50s and became tachypnic. Placed back on BIPAP and o2 sats normalized. Frequent mouth care provided along with pain management. Midline dressing changed per order.

## 2018-03-02 NOTE — Progress Notes (Signed)
Pharmacy Antibiotic Note  Annette Miller is a 67 y.o. female admitted on 02/25/2018 with pneumonia. Patient underwent ex lap with appendectomy, Hartmann sigmoidectomy, and colostomy on 5/31 before being transferred to ICU. Patient was previously taking zosyn post-op and has developed pulmonary edema, atelectasis, and possible pnuemonia. Pharmacy has been consulted for meropenem and vancomycin dosing.  Plan: Will initiate meropenem IV 1 gram q8h. Continue vancomycin 750 mg q18h for goal trough of 15-20. Will monitor trough prior to am dose on 6/7.   Height: 5\' 2"  (157.5 cm) Weight: 110 lb 3.7 oz (50 kg) IBW/kg (Calculated) : 50.1  Temp (24hrs), Avg:98.4 F (36.9 C), Min:98 F (36.7 C), Max:98.7 F (37.1 C)  Recent Labs  Lab 02/26/18 0413 02/27/18 0804 02/28/18 0512 03/01/18 0626 03/02/18 0507  WBC 7.8 10.2 16.9* 18.9* 9.9  CREATININE 0.61 0.52 0.50 0.47 0.49    Estimated Creatinine Clearance: 54.6 mL/min (by C-G formula based on SCr of 0.49 mg/dL).    Allergies  Allergen Reactions  . Codeine Diarrhea and Nausea And Vomiting  . Prednisone Other (See Comments)    Reaction: violence    Antimicrobials this admission: Zosyn 5/31 >> 6/5 Vancomycin 6/4 >>  Meropenem 6/5 >>  Dose adjustments this admission: n/a  Microbiology results: 5/31 Abdomen fluid culture: rare e.coli (meropenem sensitive), rare p.aeruginosa (meropenem sensitive)  5/31 MRSA PCR: negative 6/4 MRSA PCA: negative  Thank you for allowing pharmacy to be a part of this patient's care.  Koleen NimrodCorinne J Deroy Noah 03/02/2018 11:52 AM

## 2018-03-02 NOTE — Care Management (Signed)
LTAC screen requested from [employer] transport managerand [facility].

## 2018-03-02 NOTE — Progress Notes (Signed)
[employer] -  at [facility]                                                                                                                                                                                  Patient Demographics   Annette Miller, is a 67 y.o. female, DOB - 25-Jul-1951, ZOX:[id number]  Admit date - 02/25/2018   Admitting Physician Leafy Ro, MD  Outpatient Primary MD for the patient is Steele Sizer, MD   LOS - 5  Subjective: Patient remains on BiPAP continues to have abdominal pain  Review of Systems:   CONSTITUTIONAL: No documented fever. No fatigue, weakness. No weight gain, no weight loss.  EYES: No blurry or double vision.  ENT: No tinnitus. No postnasal drip. No redness of the oropharynx.  RESPIRATORY: No cough, no wheeze, no hemoptysis.  Positive dyspnea.  CARDIOVASCULAR: No chest pain. No orthopnea. No palpitations. No syncope.  GASTROINTESTINAL: No nausea, no vomiting or diarrhea.  Positive abdominal pain. No melena or hematochezia.  GENITOURINARY: No dysuria or hematuria.  ENDOCRINE: No polyuria or nocturia. No heat or cold intolerance.  HEMATOLOGY: No anemia. No bruising. No bleeding.  INTEGUMENTARY: No rashes. No lesions.  MUSCULOSKELETAL: No arthritis. No swelling. No gout.  NEUROLOGIC: No numbness, tingling, or ataxia. No seizure-type activity.  PSYCHIATRIC: No anxiety. No insomnia. No ADD.    Vitals:   Vitals:   03/02/18 1200 03/02/18 1300 03/02/18 1400 03/02/18 1500  BP: 107/79  106/61 114/71  Pulse: (!) 103 (!) 104 96 100  Resp: 20 (!) 21 (!) 21 20  Temp: 97.9 F (36.6 C)     TempSrc: Axillary     SpO2: 97% 99% 99% 94%  Weight:      Height:        Wt Readings from Last 3 Encounters:  03/01/18 50 kg (110 lb 3.7 oz)  01/14/18 48.5 kg (107 lb)  01/13/18 48.5 kg (107 lb)     Intake/Output Summary (Last 24 hours) at 03/02/2018 1544 Last data filed at 03/02/2018 1406 Gross per 24 hour  Intake 350 ml  Output 3200  ml  Net -2850 ml    Physical Exam:   GENERAL: Pleasant-appearing in no apparent distress.  HEAD, EYES, EARS, NOSE AND THROAT: Atraumatic, normocephalic. Extraocular muscles are intact. Pupils equal and reactive to light. Sclerae anicteric. No conjunctival injection. No oro-pharyngeal erythema.  NECK: Supple. There is no jugular venous distention. No bruits, no lymphadenopathy, no thyromegaly.  HEART: Regular rate and rhythm,. No murmurs, no rubs, no clicks.  LUNGS: Rhonchus breath sounds bilaterally ABDOMEN: Soft, postop EXTREMITIES: No evidence of any cyanosis, clubbing, or peripheral edema.  +2 pedal and  radial pulses bilaterally.  NEUROLOGIC: The patient is alert, awake, and oriented x3 with no focal motor or sensory deficits appreciated bilaterally.  SKIN: Moist and warm with no rashes appreciated.  Psych: Not anxious, depressed LN: No inguinal LN enlargement    Antibiotics   Anti-infectives (From admission, onward)   Start     Dose/Rate Route Frequency Ordered Stop   03/02/18 1500  meropenem (MERREM) 1 g in sodium chloride 0.9 % 100 mL IVPB  Status:  Discontinued     1 g 200 mL/hr over 30 Minutes Intravenous Every 12 hours 03/02/18 1149 03/02/18 1151   03/02/18 1400  meropenem (MERREM) 1 g in sodium chloride 0.9 % 100 mL IVPB     1 g 200 mL/hr over 30 Minutes Intravenous Every 8 hours 03/02/18 1151     03/02/18 0400  vancomycin (VANCOCIN) IVPB 750 mg/150 ml premix     750 mg 150 mL/hr over 60 Minutes Intravenous Every 18 hours 03/01/18 1014     03/01/18 1015  vancomycin (VANCOCIN) IVPB 1000 mg/200 mL premix     1,000 mg 200 mL/hr over 60 Minutes Intravenous  Once 03/01/18 1012 03/01/18 1206   02/25/18 1600  piperacillin-tazobactam (ZOSYN) IVPB 3.375 g  Status:  Discontinued     3.375 g 12.5 mL/hr over 240 Minutes Intravenous Every 8 hours 02/25/18 1326 03/02/18 1037   02/25/18 0645  vancomycin (VANCOCIN) IVPB 1000 mg/200 mL premix     1,000 mg 200 mL/hr over 60 Minutes  Intravenous  Once 02/25/18 0639 02/25/18 0814   02/25/18 0630  piperacillin-tazobactam (ZOSYN) IVPB 3.375 g     3.375 g 100 mL/hr over 30 Minutes Intravenous  Once 02/25/18 0619 02/25/18 0715   02/25/18 0630  vancomycin (VANCOCIN) injection 1 g  Status:  Discontinued     1 g Intravenous  Once 02/25/18 0619 02/25/18 0639      Medications   Scheduled Meds: . enoxaparin (LOVENOX) injection  40 mg Subcutaneous Q24H  . ketorolac  15 mg Intravenous Q6H  . levothyroxine  25 mcg Oral QAC breakfast  . mouth rinse  15 mL Mouth Rinse BID  . sodium chloride flush  10-40 mL Intracatheter Q12H   Continuous Infusions: . meropenem (MERREM) IV Stopped (03/02/18 1408)  . vancomycin Stopped (03/02/18 0710)   PRN Meds:.acetaminophen, ipratropium-albuterol, morphine injection, ondansetron **OR** ondansetron (ZOFRAN) IV, oxyCODONE, sodium chloride flush   Data Review:   Micro Results Recent Results (from the past 240 hour(s))  Aerobic/Anaerobic Culture (surgical/deep wound)     Status: None   Collection Time: 02/25/18  9:50 AM  Result Value Ref Range Status   Specimen Description   Final    ABDOMEN FLUID Performed at [facility], [address], [place], [place] [address]    Special Requests   Final    NONE Performed at [facility], [address] [place], [place] [address]    Gram Stain   Final    ABUNDANT WBC PRESENT, PREDOMINANTLY PMN RARE GRAM POSITIVE COCCI    Culture   Final    RARE ESCHERICHIA COLI RARE PSEUDOMONAS AERUGINOSA NO ANAEROBES ISOLATED Performed at [facility], [address], [place], [place] [address]    Report Status 03/02/2018 FINAL  Final   Organism ID, Bacteria ESCHERICHIA COLI  Final   Organism ID, Bacteria PSEUDOMONAS AERUGINOSA  Final      Susceptibility   Escherichia coli - MIC*    AMPICILLIN <=2 SENSITIVE Sensitive     CEFAZOLIN <=4  SENSITIVE Sensitive     CEFEPIME <=1 SENSITIVE Sensitive     CEFTAZIDIME <=1 SENSITIVE  Sensitive     CEFTRIAXONE <=1 SENSITIVE Sensitive     CIPROFLOXACIN <=0.25 SENSITIVE Sensitive     GENTAMICIN <=1 SENSITIVE Sensitive     IMIPENEM <=0.25 SENSITIVE Sensitive     TRIMETH/SULFA <=20 SENSITIVE Sensitive     AMPICILLIN/SULBACTAM <=2 SENSITIVE Sensitive     PIP/TAZO <=4 SENSITIVE Sensitive     Extended ESBL NEGATIVE Sensitive     * RARE ESCHERICHIA COLI   Pseudomonas aeruginosa - MIC*    CEFTAZIDIME 4 SENSITIVE Sensitive     CIPROFLOXACIN <=0.25 SENSITIVE Sensitive     GENTAMICIN <=1 SENSITIVE Sensitive     IMIPENEM <=0.25 SENSITIVE Sensitive     PIP/TAZO 8 SENSITIVE Sensitive     CEFEPIME 2 SENSITIVE Sensitive     * RARE PSEUDOMONAS AERUGINOSA  MRSA PCR Screening     Status: None   Collection Time: 02/25/18  3:35 PM  Result Value Ref Range Status   MRSA by PCR NEGATIVE NEGATIVE Final    Comment:        The GeneXpert MRSA Assay (FDA approved for NASAL specimens only), is one component of a comprehensive MRSA colonization surveillance program. It is not intended to diagnose MRSA infection nor to guide or monitor treatment for MRSA infections. Performed at [facility], [address] [place], [place] [address]   MRSA PCR Screening     Status: None   Collection Time: 03/01/18  1:55 PM  Result Value Ref Range Status   MRSA by PCR NEGATIVE NEGATIVE Final    Comment:        The GeneXpert MRSA Assay (FDA approved for NASAL specimens only), is one component of a comprehensive MRSA colonization surveillance program. It is not intended to diagnose MRSA infection nor to guide or monitor treatment for MRSA infections. Performed at [facility], [address] [place], [place] [address]     Radiology Reports Ct Angio Chest Pe W Or Wo Contrast  Result Date: 03/01/2018 CLINICAL DATA:  Desaturation, chest tightness. EXAM: CT ANGIOGRAPHY CHEST WITH CONTRAST TECHNIQUE: Multidetector CT imaging of the chest was performed using the standard  protocol during bolus administration of intravenous contrast. Multiplanar CT image reconstructions and MIPs were obtained to evaluate the vascular anatomy. CONTRAST:  75mL ISOVUE-370 IOPAMIDOL (ISOVUE-370) INJECTION 76% COMPARISON:  Chest x-ray from earlier same day. FINDINGS: Cardiovascular: There is no pulmonary embolism identified within the main, lobar or segmental pulmonary arteries bilaterally. No thoracic aortic aneurysm or evidence of aortic dissection. Mild aortic atherosclerosis. Mild cardiomegaly. No significant pericardial effusion. Mediastinum/Nodes: No mass or enlarged lymph nodes seen within the mediastinum or perihilar regions. Esophagus appears normal. Trachea and central bronchi are unremarkable. Lungs/Pleura: Diffuse ground-glass opacities throughout both lungs, mid and upper lobe predominant. Denser consolidation within the superior segment of the RIGHT lower lobe, highly suspicious for pneumonia. Small RIGHT pleural effusion. No pneumothorax. Upper Abdomen: No acute findings within the upper abdomen. Musculoskeletal: No acute or suspicious osseous finding. RIGHT humeral head/neck fracture, incompletely imaged, presumably chronic. Mild degenerative change within the thoracic spine. Review of the MIP images confirms the above findings. IMPRESSION: 1. Diffuse ground-glass opacities throughout both lungs, mid and upper lung predominant. Favor pulmonary edema. Differential includes atypical pneumonias such as viral or fungal, interstitial pneumonias, edema related to volume overload/CHF, chronic interstitial diseases, hypersensitivity pneumonitis, and respiratory bronchiolitis. 2. Probable RIGHT lower lobe pneumonia (superior segment of the RIGHT lower lobe).  3. Small RIGHT pleural effusion. 4. No pulmonary embolism. 5. RIGHT humeral head/neck fracture, incompletely imaged, presumably chronic. No previous x-rays to assess acuity. 6. Mild aortic atherosclerosis. Aortic Atherosclerosis (ICD10-I70.0).  These results will be called to the ordering clinician or representative by the Radiologist Assistant, and communication documented in the PACS or zVision Dashboard. Electronically Signed   By: Bary Richard M.D.   On: 03/01/2018 09:13   Ct Abdomen Pelvis W Contrast  Result Date: 02/25/2018 CLINICAL DATA:  Acute onset of generalized abdominal pain and tenderness. Loss of 36 pounds over the past month. EXAM: CT ABDOMEN AND PELVIS WITH CONTRAST TECHNIQUE: Multidetector CT imaging of the abdomen and pelvis was performed using the standard protocol following bolus administration of intravenous contrast. CONTRAST:  75mL OMNIPAQUE IOHEXOL 300 MG/ML  SOLN COMPARISON:  CT of the abdomen and pelvis from 12/08/2017 FINDINGS: Lower chest: Mild right basilar atelectasis or scarring is noted. Scattered coronary artery calcifications are seen. Hepatobiliary: The liver is grossly unremarkable. Trace free fluid is noted tracking about the liver. The gallbladder is diffusely distended. There is dilatation of the common bile duct to 1.0 cm in diameter, raising concern for distal obstruction. Pancreas: The pancreas is within normal limits. Spleen: The spleen is unremarkable in appearance. Adrenals/Urinary Tract: The adrenal glands are unremarkable in appearance. The kidneys are within normal limits. There is no evidence of hydronephrosis. No renal or ureteral stones are identified. No perinephric stranding is seen. Stomach/Bowel: Scattered free air is noted within the abdomen and pelvis, raising concern for bowel perforation. Mild complex fluid is noted tracking about large and small bowel loops. The site of bowel perforation is not definitely characterized on CT. There is diffuse wall thickening along the sigmoid colon, concerning for an underlying infectious or inflammatory colitis. The scattered pattern of air adjacent to the proximal sigmoid colon may correspond to the site of perforation. Scattered diverticulosis is noted  along the sigmoid colon. The small bowel is grossly unremarkable. Residual wall thickening is noted along the antrum of the stomach and proximal duodenum. Adjacent prominent nodes are again seen, and underlying mass is a concern. The appendix is unremarkable in appearance. Vascular/Lymphatic: Scattered calcification is seen along the abdominal aorta and its branches. The abdominal aorta is otherwise grossly unremarkable. The inferior vena cava is grossly unremarkable. No retroperitoneal lymphadenopathy is seen. No pelvic sidewall lymphadenopathy is identified. Reproductive: The bladder is mildly distended and grossly unremarkable. The patient is status post hysterectomy. No suspicious adnexal masses are seen. Other: No additional soft tissue abnormalities are seen. Musculoskeletal: No acute osseous abnormalities are identified. The visualized musculature is unremarkable in appearance. IMPRESSION: 1. Bowel perforation, with scattered free air in the abdomen and pelvis. Mild complex fluid tracks about large and small bowel loops. The site of bowel perforation is not definitely characterized. It may be at the proximal sigmoid colon. 2. Diffuse wall thickening along the sigmoid colon is compatible with underlying infectious or inflammatory colitis. 3. Residual wall thickening along the antrum of the stomach and proximal duodenum. Adjacent prominent nodes again noted. Underlying mass is suspected. Given prior gastric ulceration, perforation in this region is also possibility. Endoscopy would be helpful for further evaluation, when and as deemed clinically appropriate. 4. Dilatation of the common bile duct to 1.0 cm in diameter, raising concern for distal obstruction. Diffuse gallbladder distention. Would correlate with LFTs. 5. Scattered diverticulosis along the sigmoid colon. Aortic Atherosclerosis (ICD10-I70.0). These results were called by telephone at the time of interpretation on 02/25/2018 at  6:09 am to Dr. York Cerise,  who verbally acknowledged these results. Electronically Signed   By: Roanna Raider M.D.   On: 02/25/2018 06:15   Dg Chest Port 1 View  Result Date: 03/02/2018 CLINICAL DATA:  Respiratory failure. EXAM: PORTABLE CHEST 1 VIEW COMPARISON:  Radiograph of March 01, 2018. FINDINGS: The heart size and mediastinal contours are within normal limits. No pneumothorax or pleural effusion is noted. Right-sided PICC line is noted which is unchanged in position. Stable diffuse reticular densities are noted throughout both lungs most consistent with pulmonary edema, although atypical infection cannot be excluded. Stable proximal right humeral fracture is noted. IMPRESSION: Stable diffuse reticular densities are noted throughout both lungs most consistent with pulmonary edema, although atypical infection cannot be excluded. Electronically Signed   By: Lupita Raider, M.D.   On: 03/02/2018 09:10   Dg Chest Port 1 View  Result Date: 03/01/2018 CLINICAL DATA:  Acute onset of respiratory distress. Patient in abdominal surgery. EXAM: PORTABLE CHEST 1 VIEW COMPARISON:  Chest radiograph performed 02/26/2018 FINDINGS: Diffuse bilateral airspace opacification is new from the prior study and may reflect flash pulmonary edema or possibly pneumonia. ARDS cannot be excluded. A small right pleural effusion is noted. No pneumothorax is seen The cardiomediastinal silhouette is normal in size. A displaced right humeral head and neck fracture is noted. The patient's right PICC is noted ending about the mid SVC. IMPRESSION: 1. Diffuse bilateral airspace opacification is new from the prior study and may reflect flash pulmonary edema or possibly pneumonia. ARDS cannot be excluded. Small right pleural effusion noted. 2. Displaced right humeral head and neck fracture noted. Electronically Signed   By: Roanna Raider M.D.   On: 03/01/2018 04:42   Dg Chest Port 1 View  Result Date: 02/26/2018 CLINICAL DATA:  67 year old female status post gastric  tube placement EXAM: PORTABLE CHEST 1 VIEW COMPARISON:  CT scan of the abdomen and pelvis 02/25/2018 FINDINGS: A nasogastric tube is present and projects over the left upper quadrant in the expected position. Cardiac and mediastinal contours are within normal limits. Atherosclerotic calcifications present in the transverse aorta. Mild left retrocardiac airspace opacity favored to reflect atelectasis. No pleural effusion or pneumothorax. No acute osseous abnormality. IMPRESSION: 1. Well-positioned gastrostomy tube. 2. Left retrocardiac airspace opacity may reflect atelectasis or infiltrate. Atelectasis is favored. 3.  Aortic Atherosclerosis (ICD10-170.0) Electronically Signed   By: Malachy Moan M.D.   On: 02/26/2018 14:15   [place] Ekg Site Rite  Result Date: 02/26/2018 If Site Rite image not attached, placement could not be confirmed due to current cardiac rhythm.    CBC Recent Labs  Lab 02/25/18 0323 02/25/18 1440 02/26/18 0413 02/27/18 0804 02/28/18 0512 03/01/18 0626 03/02/18 0507  WBC 8.5 6.5 7.8 10.2 16.9* 18.9* 9.9  HGB 10.4* 8.2* 7.4* 8.3* 8.4* 8.7* 7.6*  HCT 30.0* 23.6* 22.6* 24.1* 24.8* 26.0* 22.0*  PLT 1,080* 698* 740* 622* 690* 715* 505*  MCV 95.3 96.3 95.1 92.5 91.5 92.0 92.0  MCH 33.1 33.4 31.2 31.9 30.8 30.8 32.0  MCHC 34.8 34.6 32.8 34.5 33.7 33.4 34.7  RDW 14.3 14.0 14.6* 16.2* 15.9* 15.5* 15.0*  LYMPHSABS 1.4 0.5* 0.9* 1.1  --   --   --   MONOABS 0.2 0.2 0.3 0.4  --   --   --   EOSABS 0.0 0.0 0.0 0.1  --   --   --   BASOSABS 0.0 0.0 0.0 0.0  --   --   --  Chemistries  Recent Labs  Lab 02/25/18 0323  02/26/18 0413 02/27/18 0804 02/28/18 0512 03/01/18 0626 03/02/18 0507  NA 136   < > 133* 134* 132* 131* 136  K 3.2*   < > 3.8 4.0 3.9 4.1 3.8  CL 93*   < > 103 104 101 99* 99*  CO2 26   < > 24 24 25 25 30   GLUCOSE 96   < > 133* 94 121* 115* 93  BUN 18   < > 12 7 <5* <5* 6  CREATININE 0.99   < > 0.61 0.52 0.50 0.47 0.49  CALCIUM 8.9   < > 7.1* 7.4* 7.2*  7.2* 7.1*  MG  --   --  1.7 1.9  --   --   --   AST 35  --  24 21  --   --   --   ALT 10*  --  11* 11*  --   --   --   ALKPHOS 107  --  68 74  --   --   --   BILITOT 0.5  --  0.2* 0.7  --   --   --    < > = values in this interval not displayed.   ------------------------------------------------------------------------------------------------------------------ estimated creatinine clearance is 54.6 mL/min (by C-G formula based on SCr of 0.49 mg/dL). ------------------------------------------------------------------------------------------------------------------ No results for input(s): HGBA1C in the last 72 hours. ------------------------------------------------------------------------------------------------------------------ No results for input(s): CHOL, HDL, LDLCALC, TRIG, CHOLHDL, LDLDIRECT in the last 72 hours. ------------------------------------------------------------------------------------------------------------------ No results for input(s): TSH, T4TOTAL, T3FREE, THYROIDAB in the last 72 hours.  Invalid input(s): FREET3 ------------------------------------------------------------------------------------------------------------------ No results for input(s): VITAMINB12, FOLATE, FERRITIN, TIBC, IRON, RETICCTPCT in the last 72 hours.  Coagulation profile No results for input(s): INR, PROTIME in the last 168 hours.  No results for input(s): DDIMER in the last 72 hours.  Cardiac Enzymes No results for input(s): CKMB, TROPONINI, MYOGLOBIN in the last 168 hours.  Invalid input(s): CK ------------------------------------------------------------------------------------------------------------------ Invalid input(s): POCBNP    Assessment & Plan  Patient is a 67 year old lady with history of COPD, chronic tobacco abuse status post surgery noted to have acute respiratory failure Patient is a 67 year old lady with history of COPD, chronic tobacco abuse status post surgery noted  to have acute respiratory failure   1.  Acute hypoxic respiratory failure differential diagnosis based on the CT scan  Possible atypical pneumonia, ARDS Continue vancomycin and meropenem Echo of the heart currently pending Continue BiPAP Appreciate intensivist input  2.  Sinus tachycardia likely due to #1  3.  Hypothyroidism continue Synthroid  4.  GERD continue Protonix  5.  Hyperlipidemia continue Crestor  6.  Nicotine abuse smoking cessation provided        Code Status Orders  (From admission, onward)        Start     Ordered   02/25/18 1317  Full code  Continuous     02/25/18 1326    Code Status History    This patient has a current code status but no historical code status.    Advance Directive Documentation     Most Recent Value  Type of Advance Directive  Healthcare Power of Attorney, Living will  Pre-existing out of facility DNR order (yellow form or pink MOST form)  -  "MOST" Form in Place?  -             DVT Prophylaxis  Lovenox   Lab Results  Component Value Date   PLT 505 (H)  03/02/2018     Time Spent in minutes   35 minutes spent  Auburn Bilberry M.D on 03/02/2018 at 3:44 PM  Between 7am to 6pm - Pager - [phone]  After 6pm go to [email or web address] - Social research officer, government  [employer]   Office  [phone]

## 2018-03-02 NOTE — Progress Notes (Signed)
Patient completed AD selections on 03/01/2018 but the document was not notarized. Chaplain followed up this morning to assist with notarization and discovered that the AD is missing. Chaplain will check on 2C.

## 2018-03-02 NOTE — Progress Notes (Signed)
5 Days Post-Op  Subjective: Patient on BiPAP this morning.  Her vital signs are improved.  She appears comfortable.  Objective: Vital signs in last 24 hours: Temp:  [98.2 F (36.8 C)-98.7 F (37.1 C)] 98.6 F (37 C) (06/05 0200) Pulse Rate:  [88-108] 95 (06/05 0600) Resp:  [17-32] 18 (06/05 0600) BP: (103-128)/(54-93) 112/63 (06/05 0600) SpO2:  [74 %-100 %] 94 % (06/05 0600) Weight:  [110 lb 3.7 oz (50 kg)] 110 lb 3.7 oz (50 kg) (06/04 1830) Last BM Date: 02/28/18  Intake/Output from previous day: 06/04 0701 - 06/05 0700 In: 590 [P.O.:240; IV Piggyback:350] Out: 2540 [Urine:2075; Drains:465] Intake/Output this shift: No intake/output data recorded.  Physical exam:  Patient appears comfortable vital signs are improved abdomen is soft nontender.  There is no colostomy output yet and serous fluid only in the drain.  The wound is granulating.  Lab Results: CBC  Recent Labs    03/01/18 0626 03/02/18 0507  WBC 18.9* 9.9  HGB 8.7* 7.6*  HCT 26.0* 22.0*  PLT 715* 505*   BMET Recent Labs    03/01/18 0626 03/02/18 0507  NA 131* 136  K 4.1 3.8  CL 99* 99*  CO2 25 30  GLUCOSE 115* 93  BUN <5* 6  CREATININE 0.47 0.49  CALCIUM 7.2* 7.1*   PT/INR No results for input(s): LABPROT, INR in the last 72 hours. ABG Recent Labs    03/01/18 1639  PHART 7.44  HCO3 30.6*    Studies/Results: Ct Angio Chest Pe W Or Wo Contrast  Result Date: 03/01/2018 CLINICAL DATA:  Desaturation, chest tightness. EXAM: CT ANGIOGRAPHY CHEST WITH CONTRAST TECHNIQUE: Multidetector CT imaging of the chest was performed using the standard protocol during bolus administration of intravenous contrast. Multiplanar CT image reconstructions and MIPs were obtained to evaluate the vascular anatomy. CONTRAST:  75mL ISOVUE-370 IOPAMIDOL (ISOVUE-370) INJECTION 76% COMPARISON:  Chest x-ray from earlier same day. FINDINGS: Cardiovascular: There is no pulmonary embolism identified within the main, lobar or  segmental pulmonary arteries bilaterally. No thoracic aortic aneurysm or evidence of aortic dissection. Mild aortic atherosclerosis. Mild cardiomegaly. No significant pericardial effusion. Mediastinum/Nodes: No mass or enlarged lymph nodes seen within the mediastinum or perihilar regions. Esophagus appears normal. Trachea and central bronchi are unremarkable. Lungs/Pleura: Diffuse ground-glass opacities throughout both lungs, mid and upper lobe predominant. Denser consolidation within the superior segment of the RIGHT lower lobe, highly suspicious for pneumonia. Small RIGHT pleural effusion. No pneumothorax. Upper Abdomen: No acute findings within the upper abdomen. Musculoskeletal: No acute or suspicious osseous finding. RIGHT humeral head/neck fracture, incompletely imaged, presumably chronic. Mild degenerative change within the thoracic spine. Review of the MIP images confirms the above findings. IMPRESSION: 1. Diffuse ground-glass opacities throughout both lungs, mid and upper lung predominant. Favor pulmonary edema. Differential includes atypical pneumonias such as viral or fungal, interstitial pneumonias, edema related to volume overload/CHF, chronic interstitial diseases, hypersensitivity pneumonitis, and respiratory bronchiolitis. 2. Probable RIGHT lower lobe pneumonia (superior segment of the RIGHT lower lobe). 3. Small RIGHT pleural effusion. 4. No pulmonary embolism. 5. RIGHT humeral head/neck fracture, incompletely imaged, presumably chronic. No previous x-rays to assess acuity. 6. Mild aortic atherosclerosis. Aortic Atherosclerosis (ICD10-I70.0). These results will be called to the ordering clinician or representative by the Radiologist Assistant, and communication documented in the PACS or zVision Dashboard. Electronically Signed   By: Bary  M.D.   On: 03/01/2018 09:13   Dg Chest Port 1 View  Result Date: 03/01/2018 CLINICAL DATA:  Acute onset of respiratory  distress. Patient in abdominal  surgery. EXAM: PORTABLE CHEST 1 VIEW COMPARISON:  Chest radiograph performed 02/26/2018 FINDINGS: Diffuse bilateral airspace opacification is new from the prior study and may reflect flash pulmonary edema or possibly pneumonia. ARDS cannot be excluded. A small right pleural effusion is noted. No pneumothorax is seen The cardiomediastinal silhouette is normal in size. A displaced right humeral head and neck fracture is noted. The patient's right PICC is noted ending about the mid SVC. IMPRESSION: 1. Diffuse bilateral airspace opacification is new from the prior study and may reflect flash pulmonary edema or possibly pneumonia. ARDS cannot be excluded. Small right pleural effusion noted. 2. Displaced right humeral head and neck fracture noted. Electronically Signed   By: Roanna RaiderJeffery  Chang M.D.   On: 03/01/2018 04:42    Anti-infectives: Anti-infectives (From admission, onward)   Start     Dose/Rate Route Frequency Ordered Stop   03/02/18 0400  vancomycin (VANCOCIN) IVPB 750 mg/150 ml premix     750 mg 150 mL/hr over 60 Minutes Intravenous Every 18 hours 03/01/18 1014     03/01/18 1015  vancomycin (VANCOCIN) IVPB 1000 mg/200 mL premix     1,000 mg 200 mL/hr over 60 Minutes Intravenous  Once 03/01/18 1012 03/01/18 1206   02/25/18 1600  piperacillin-tazobactam (ZOSYN) IVPB 3.375 g     3.375 g 12.5 mL/hr over 240 Minutes Intravenous Every 8 hours 02/25/18 1326     02/25/18 0645  vancomycin (VANCOCIN) IVPB 1000 mg/200 mL premix     1,000 mg 200 mL/hr over 60 Minutes Intravenous  Once 02/25/18 0639 02/25/18 0814   02/25/18 0630  piperacillin-tazobactam (ZOSYN) IVPB 3.375 g     3.375 g 100 mL/hr over 30 Minutes Intravenous  Once 02/25/18 0619 02/25/18 0715   02/25/18 0630  vancomycin (VANCOCIN) injection 1 g  Status:  Discontinued     1 g Intravenous  Once 02/25/18 0619 02/25/18 [id number]      Assessment/Plan: s/p Procedure(s): EXPLORATORY LAPAROTOMY, PARTMAN'S PROCEDURE, APPENDECTOMY, COLOSTOMY   White  blood cell count has improved. Patient doing well at this point.  I discussed with critical care medicine last night the possible etiologies for this.  She is currently improved and stable.  Will leave decision making concerning her pulmonary status up to the critical care medicine and internal medicine staff at this time.  Awaiting bowel function for advancing diet.  Lattie Hawichard E Deveion Denz, MD, FACS  03/02/2018

## 2018-03-02 NOTE — Progress Notes (Signed)
*  PRELIMINARY RESULTS* Echocardiogram 2D Echocardiogram has been performed.  [name], [name] 03/02/2018, 8:39 AM

## 2018-03-02 NOTE — Care Management (Addendum)
Patient is appropriate for LTAC however will require ~21 day stay.

## 2018-03-02 NOTE — Progress Notes (Signed)
[facility] [place] [facility]                                                                                                                                                                                  Patient Demographics   Annette Miller, is a 67 y.o. female, DOB - December 17, 1950, [id number]  Admit date - 02/25/2018   Admitting Physician Leafy Roiego F Pabon, MD  Outpatient Primary MD for the patient is Steele Sizerrissman, Mark A, MD   LOS - 5  Subjective: Called by nurse due to patient having worsening respiratory distress unable to keep sats up. Patient having difficulty with breathe   Review of Systems:   CONSTITUTIONAL: No documented fever. No fatigue, weakness. No weight gain, no weight loss.  EYES: No blurry or double vision.  ENT: No tinnitus. No postnasal drip. No redness of the oropharynx.  RESPIRATORY: No cough, no wheeze, no hemoptysis.  Positive dyspnea.  CARDIOVASCULAR: No chest pain. No orthopnea. No palpitations. No syncope.  GASTROINTESTINAL: No nausea, no vomiting or diarrhea.  Positive abdominal pain. No melena or hematochezia.  GENITOURINARY: No dysuria or hematuria.  ENDOCRINE: No polyuria or nocturia. No heat or cold intolerance.  HEMATOLOGY: No anemia. No bruising. No bleeding.  INTEGUMENTARY: No rashes. No lesions.  MUSCULOSKELETAL: No arthritis. No swelling. No gout.  NEUROLOGIC: No numbness, tingling, or ataxia. No seizure-type activity.  PSYCHIATRIC: No anxiety. No insomnia. No ADD.    Vitals:   Vitals:   03/02/18 1200 03/02/18 1300 03/02/18 1400 03/02/18 1500  BP: 107/79  106/61 114/71  Pulse: (!) 103 (!) 104 96 100  Resp: 20 (!) 21 (!) 21 20  Temp: 97.9 F (36.6 C)     TempSrc: Axillary     SpO2: 97% 99% 99% 94%  Weight:      Height:        Wt Readings from Last 3 Encounters:  03/01/18 50 kg (110 lb 3.7 oz)  01/14/18 48.5 kg (107 lb)  01/13/18 48.5 kg (107 lb)     Intake/Output Summary (Last 24 hours) at 03/02/2018 1541 Last  data filed at 03/02/2018 1406 Gross per 24 hour  Intake 350 ml  Output 3200 ml  Net -2850 ml    Physical Exam:   GENERAL: Pleasant-appearing in no apparent distress.  HEAD, EYES, EARS, NOSE AND THROAT: Atraumatic, normocephalic. Extraocular muscles are intact. Pupils equal and reactive to light. Sclerae anicteric. No conjunctival injection. No oro-pharyngeal erythema.  NECK: Supple. There is no jugular venous distention. No bruits, no lymphadenopathy, no thyromegaly.  HEART: Regular rate and rhythm,. No murmurs, no rubs, no clicks.  LUNGS: Rhonchus breath sounds bilaterally ABDOMEN: Soft, postop EXTREMITIES: No  evidence of any cyanosis, clubbing, or peripheral edema.  +2 pedal and radial pulses bilaterally.  NEUROLOGIC: The patient is alert, awake, and oriented x3 with no focal motor or sensory deficits appreciated bilaterally.  SKIN: Moist and warm with no rashes appreciated.  Psych: Not anxious, depressed LN: No inguinal LN enlargement    Antibiotics   Anti-infectives (From admission, onward)   Start     Dose/Rate Route Frequency Ordered Stop   03/02/18 1500  meropenem (MERREM) 1 g in sodium chloride 0.9 % 100 mL IVPB  Status:  Discontinued     1 g 200 mL/hr over 30 Minutes Intravenous Every 12 hours 03/02/18 1149 03/02/18 1151   03/02/18 1400  meropenem (MERREM) 1 g in sodium chloride 0.9 % 100 mL IVPB     1 g 200 mL/hr over 30 Minutes Intravenous Every 8 hours 03/02/18 1151     03/02/18 0400  vancomycin (VANCOCIN) IVPB 750 mg/150 ml premix     750 mg 150 mL/hr over 60 Minutes Intravenous Every 18 hours 03/01/18 1014     03/01/18 1015  vancomycin (VANCOCIN) IVPB 1000 mg/200 mL premix     1,000 mg 200 mL/hr over 60 Minutes Intravenous  Once 03/01/18 1012 03/01/18 1206   02/25/18 1600  piperacillin-tazobactam (ZOSYN) IVPB 3.375 g  Status:  Discontinued     3.375 g 12.5 mL/hr over 240 Minutes Intravenous Every 8 hours 02/25/18 1326 03/02/18 1037   02/25/18 0645  vancomycin  (VANCOCIN) IVPB 1000 mg/200 mL premix     1,000 mg 200 mL/hr over 60 Minutes Intravenous  Once 02/25/18 0639 02/25/18 0814   02/25/18 0630  piperacillin-tazobactam (ZOSYN) IVPB 3.375 g     3.375 g 100 mL/hr over 30 Minutes Intravenous  Once 02/25/18 0619 02/25/18 0715   02/25/18 0630  vancomycin (VANCOCIN) injection 1 g  Status:  Discontinued     1 g Intravenous  Once 02/25/18 0619 02/25/18 0639      Medications   Scheduled Meds: . enoxaparin (LOVENOX) injection  40 mg Subcutaneous Q24H  . ketorolac  15 mg Intravenous Q6H  . levothyroxine  25 mcg Oral QAC breakfast  . mouth rinse  15 mL Mouth Rinse BID  . sodium chloride flush  10-40 mL Intracatheter Q12H   Continuous Infusions: . meropenem (MERREM) IV Stopped (03/02/18 1408)  . vancomycin Stopped (03/02/18 0710)   PRN Meds:.acetaminophen, ipratropium-albuterol, morphine injection, ondansetron **OR** ondansetron (ZOFRAN) IV, oxyCODONE, sodium chloride flush   Data Review:   Micro Results Recent Results (from the past 240 hour(s))  Aerobic/Anaerobic Culture (surgical/deep wound)     Status: None   Collection Time: 02/25/18  9:50 AM  Result Value Ref Range Status   Specimen Description   Final    ABDOMEN FLUID Performed at [facility], [address], [place], [place] [address]    Special Requests   Final    NONE Performed at [facility], [address] [place], [place] [address]    Gram Stain   Final    ABUNDANT WBC PRESENT, PREDOMINANTLY PMN RARE GRAM POSITIVE COCCI    Culture   Final    RARE ESCHERICHIA COLI RARE PSEUDOMONAS AERUGINOSA NO ANAEROBES ISOLATED Performed at [facility], [address] [place], [place] [address]    Report Status 03/02/2018 FINAL  Final   Organism ID, Bacteria ESCHERICHIA COLI  Final   Organism ID, Bacteria PSEUDOMONAS AERUGINOSA  Final      Susceptibility   Escherichia coli - MIC*  AMPICILLIN <=2 SENSITIVE Sensitive     CEFAZOLIN <=4 SENSITIVE  Sensitive     CEFEPIME <=1 SENSITIVE Sensitive     CEFTAZIDIME <=1 SENSITIVE Sensitive     CEFTRIAXONE <=1 SENSITIVE Sensitive     CIPROFLOXACIN <=0.25 SENSITIVE Sensitive     GENTAMICIN <=1 SENSITIVE Sensitive     IMIPENEM <=0.25 SENSITIVE Sensitive     TRIMETH/SULFA <=20 SENSITIVE Sensitive     AMPICILLIN/SULBACTAM <=2 SENSITIVE Sensitive     PIP/TAZO <=4 SENSITIVE Sensitive     Extended ESBL NEGATIVE Sensitive     * RARE ESCHERICHIA COLI   Pseudomonas aeruginosa - MIC*    CEFTAZIDIME 4 SENSITIVE Sensitive     CIPROFLOXACIN <=0.25 SENSITIVE Sensitive     GENTAMICIN <=1 SENSITIVE Sensitive     IMIPENEM <=0.25 SENSITIVE Sensitive     PIP/TAZO 8 SENSITIVE Sensitive     CEFEPIME 2 SENSITIVE Sensitive     * RARE PSEUDOMONAS AERUGINOSA  MRSA PCR Screening     Status: None   Collection Time: 02/25/18  3:35 PM  Result Value Ref Range Status   MRSA by PCR NEGATIVE NEGATIVE Final    Comment:        The GeneXpert MRSA Assay (FDA approved for NASAL specimens only), is one component of a comprehensive MRSA colonization surveillance program. It is not intended to diagnose MRSA infection nor to guide or monitor treatment for MRSA infections. Performed at [facility], [address] [place], [place] [address]   MRSA PCR Screening     Status: None   Collection Time: 03/01/18  1:55 PM  Result Value Ref Range Status   MRSA by PCR NEGATIVE NEGATIVE Final    Comment:        The GeneXpert MRSA Assay (FDA approved for NASAL specimens only), is one component of a comprehensive MRSA colonization surveillance program. It is not intended to diagnose MRSA infection nor to guide or monitor treatment for MRSA infections. Performed at [facility], [address] [place], [place] [address]     Radiology Reports Ct Angio Chest Pe W Or Wo Contrast  Result Date: 03/01/2018 CLINICAL DATA:  Desaturation, chest tightness. EXAM: CT ANGIOGRAPHY CHEST WITH CONTRAST TECHNIQUE:  Multidetector CT imaging of the chest was performed using the standard protocol during bolus administration of intravenous contrast. Multiplanar CT image reconstructions and MIPs were obtained to evaluate the vascular anatomy. CONTRAST:  75mL ISOVUE-370 IOPAMIDOL (ISOVUE-370) INJECTION 76% COMPARISON:  Chest x-ray from earlier same day. FINDINGS: Cardiovascular: There is no pulmonary embolism identified within the main, lobar or segmental pulmonary arteries bilaterally. No thoracic aortic aneurysm or evidence of aortic dissection. Mild aortic atherosclerosis. Mild cardiomegaly. No significant pericardial effusion. Mediastinum/Nodes: No mass or enlarged lymph nodes seen within the mediastinum or perihilar regions. Esophagus appears normal. Trachea and central bronchi are unremarkable. Lungs/Pleura: Diffuse ground-glass opacities throughout both lungs, mid and upper lobe predominant. Denser consolidation within the superior segment of the RIGHT lower lobe, highly suspicious for pneumonia. Small RIGHT pleural effusion. No pneumothorax. Upper Abdomen: No acute findings within the upper abdomen. Musculoskeletal: No acute or suspicious osseous finding. RIGHT humeral head/neck fracture, incompletely imaged, presumably chronic. Mild degenerative change within the thoracic spine. Review of the MIP images confirms the above findings. IMPRESSION: 1. Diffuse ground-glass opacities throughout both lungs, mid and upper lung predominant. Favor pulmonary edema. Differential includes atypical pneumonias such as viral or fungal, interstitial pneumonias, edema related to volume overload/CHF, chronic interstitial diseases, hypersensitivity pneumonitis, and respiratory bronchiolitis. 2. Probable RIGHT  lower lobe pneumonia (superior segment of the RIGHT lower lobe). 3. Small RIGHT pleural effusion. 4. No pulmonary embolism. 5. RIGHT humeral head/neck fracture, incompletely imaged, presumably chronic. No previous x-rays to assess acuity.  6. Mild aortic atherosclerosis. Aortic Atherosclerosis (ICD10-I70.0). These results will be called to the ordering clinician or representative by the Radiologist Assistant, and communication documented in the PACS or zVision Dashboard. Electronically Signed   By: Bary Richard M.D.   On: 03/01/2018 09:13   Ct Abdomen Pelvis W Contrast  Result Date: 02/25/2018 CLINICAL DATA:  Acute onset of generalized abdominal pain and tenderness. Loss of 36 pounds over the past month. EXAM: CT ABDOMEN AND PELVIS WITH CONTRAST TECHNIQUE: Multidetector CT imaging of the abdomen and pelvis was performed using the standard protocol following bolus administration of intravenous contrast. CONTRAST:  75mL OMNIPAQUE IOHEXOL 300 MG/ML  SOLN COMPARISON:  CT of the abdomen and pelvis from 12/08/2017 FINDINGS: Lower chest: Mild right basilar atelectasis or scarring is noted. Scattered coronary artery calcifications are seen. Hepatobiliary: The liver is grossly unremarkable. Trace free fluid is noted tracking about the liver. The gallbladder is diffusely distended. There is dilatation of the common bile duct to 1.0 cm in diameter, raising concern for distal obstruction. Pancreas: The pancreas is within normal limits. Spleen: The spleen is unremarkable in appearance. Adrenals/Urinary Tract: The adrenal glands are unremarkable in appearance. The kidneys are within normal limits. There is no evidence of hydronephrosis. No renal or ureteral stones are identified. No perinephric stranding is seen. Stomach/Bowel: Scattered free air is noted within the abdomen and pelvis, raising concern for bowel perforation. Mild complex fluid is noted tracking about large and small bowel loops. The site of bowel perforation is not definitely characterized on CT. There is diffuse wall thickening along the sigmoid colon, concerning for an underlying infectious or inflammatory colitis. The scattered pattern of air adjacent to the proximal sigmoid colon may  correspond to the site of perforation. Scattered diverticulosis is noted along the sigmoid colon. The small bowel is grossly unremarkable. Residual wall thickening is noted along the antrum of the stomach and proximal duodenum. Adjacent prominent nodes are again seen, and underlying mass is a concern. The appendix is unremarkable in appearance. Vascular/Lymphatic: Scattered calcification is seen along the abdominal aorta and its branches. The abdominal aorta is otherwise grossly unremarkable. The inferior vena cava is grossly unremarkable. No retroperitoneal lymphadenopathy is seen. No pelvic sidewall lymphadenopathy is identified. Reproductive: The bladder is mildly distended and grossly unremarkable. The patient is status post hysterectomy. No suspicious adnexal masses are seen. Other: No additional soft tissue abnormalities are seen. Musculoskeletal: No acute osseous abnormalities are identified. The visualized musculature is unremarkable in appearance. IMPRESSION: 1. Bowel perforation, with scattered free air in the abdomen and pelvis. Mild complex fluid tracks about large and small bowel loops. The site of bowel perforation is not definitely characterized. It may be at the proximal sigmoid colon. 2. Diffuse wall thickening along the sigmoid colon is compatible with underlying infectious or inflammatory colitis. 3. Residual wall thickening along the antrum of the stomach and proximal duodenum. Adjacent prominent nodes again noted. Underlying mass is suspected. Given prior gastric ulceration, perforation in this region is also possibility. Endoscopy would be helpful for further evaluation, when and as deemed clinically appropriate. 4. Dilatation of the common bile duct to 1.0 cm in diameter, raising concern for distal obstruction. Diffuse gallbladder distention. Would correlate with LFTs. 5. Scattered diverticulosis along the sigmoid colon. Aortic Atherosclerosis (ICD10-I70.0). These results were called  by  telephone at the time of interpretation on 02/25/2018 at 6:09 am to Dr. York Cerise, who verbally acknowledged these results. Electronically Signed   By: Roanna Raider M.D.   On: 02/25/2018 06:15   Dg Chest Port 1 View  Result Date: 03/02/2018 CLINICAL DATA:  Respiratory failure. EXAM: PORTABLE CHEST 1 VIEW COMPARISON:  Radiograph of March 01, 2018. FINDINGS: The heart size and mediastinal contours are within normal limits. No pneumothorax or pleural effusion is noted. Right-sided PICC line is noted which is unchanged in position. Stable diffuse reticular densities are noted throughout both lungs most consistent with pulmonary edema, although atypical infection cannot be excluded. Stable proximal right humeral fracture is noted. IMPRESSION: Stable diffuse reticular densities are noted throughout both lungs most consistent with pulmonary edema, although atypical infection cannot be excluded. Electronically Signed   By: Lupita Raider, M.D.   On: 03/02/2018 09:10   Dg Chest Port 1 View  Result Date: 03/01/2018 CLINICAL DATA:  Acute onset of respiratory distress. Patient in abdominal surgery. EXAM: PORTABLE CHEST 1 VIEW COMPARISON:  Chest radiograph performed 02/26/2018 FINDINGS: Diffuse bilateral airspace opacification is new from the prior study and may reflect flash pulmonary edema or possibly pneumonia. ARDS cannot be excluded. A small right pleural effusion is noted. No pneumothorax is seen The cardiomediastinal silhouette is normal in size. A displaced right humeral head and neck fracture is noted. The patient's right PICC is noted ending about the mid SVC. IMPRESSION: 1. Diffuse bilateral airspace opacification is new from the prior study and may reflect flash pulmonary edema or possibly pneumonia. ARDS cannot be excluded. Small right pleural effusion noted. 2. Displaced right humeral head and neck fracture noted. Electronically Signed   By: Roanna Raider M.D.   On: 03/01/2018 04:42   Dg Chest Port 1  View  Result Date: 02/26/2018 CLINICAL DATA:  67 year old female status post gastric tube placement EXAM: PORTABLE CHEST 1 VIEW COMPARISON:  CT scan of the abdomen and pelvis 02/25/2018 FINDINGS: A nasogastric tube is present and projects over the left upper quadrant in the expected position. Cardiac and mediastinal contours are within normal limits. Atherosclerotic calcifications present in the transverse aorta. Mild left retrocardiac airspace opacity favored to reflect atelectasis. No pleural effusion or pneumothorax. No acute osseous abnormality. IMPRESSION: 1. Well-positioned gastrostomy tube. 2. Left retrocardiac airspace opacity may reflect atelectasis or infiltrate. Atelectasis is favored. 3.  Aortic Atherosclerosis (ICD10-170.0) Electronically Signed   By: Malachy Moan M.D.   On: 02/26/2018 14:15   [place] Ekg Site Rite  Result Date: 02/26/2018 If Site Rite image not attached, placement could not be confirmed due to current cardiac rhythm.    CBC Recent Labs  Lab 02/25/18 0323 02/25/18 1440 02/26/18 0413 02/27/18 0804 02/28/18 0512 03/01/18 0626 03/02/18 0507  WBC 8.5 6.5 7.8 10.2 16.9* 18.9* 9.9  HGB 10.4* 8.2* 7.4* 8.3* 8.4* 8.7* 7.6*  HCT 30.0* 23.6* 22.6* 24.1* 24.8* 26.0* 22.0*  PLT 1,080* 698* 740* 622* 690* 715* 505*  MCV 95.3 96.3 95.1 92.5 91.5 92.0 92.0  MCH 33.1 33.4 31.2 31.9 30.8 30.8 32.0  MCHC 34.8 34.6 32.8 34.5 33.7 33.4 34.7  RDW 14.3 14.0 14.6* 16.2* 15.9* 15.5* 15.0*  LYMPHSABS 1.4 0.5* 0.9* 1.1  --   --   --   MONOABS 0.2 0.2 0.3 0.4  --   --   --   EOSABS 0.0 0.0 0.0 0.1  --   --   --   BASOSABS 0.0 0.0 0.0 0.0  --   --   --  Chemistries  Recent Labs  Lab 02/25/18 0323  02/26/18 0413 02/27/18 0804 02/28/18 0512 03/01/18 0626 03/02/18 0507  NA 136   < > 133* 134* 132* 131* 136  K 3.2*   < > 3.8 4.0 3.9 4.1 3.8  CL 93*   < > 103 104 101 99* 99*  CO2 26   < > 24 24 25 25 30   GLUCOSE 96   < > 133* 94 121* 115* 93  BUN 18   < > 12 7 <5* <5* 6   CREATININE 0.99   < > 0.61 0.52 0.50 0.47 0.49  CALCIUM 8.9   < > 7.1* 7.4* 7.2* 7.2* 7.1*  MG  --   --  1.7 1.9  --   --   --   AST 35  --  24 21  --   --   --   ALT 10*  --  11* 11*  --   --   --   ALKPHOS 107  --  68 74  --   --   --   BILITOT 0.5  --  0.2* 0.7  --   --   --    < > = values in this interval not displayed.   ------------------------------------------------------------------------------------------------------------------ estimated creatinine clearance is 54.6 mL/min (by C-G formula based on SCr of 0.49 mg/dL). ------------------------------------------------------------------------------------------------------------------ No results for input(s): HGBA1C in the last 72 hours. ------------------------------------------------------------------------------------------------------------------ No results for input(s): CHOL, HDL, LDLCALC, TRIG, CHOLHDL, LDLDIRECT in the last 72 hours. ------------------------------------------------------------------------------------------------------------------ No results for input(s): TSH, T4TOTAL, T3FREE, THYROIDAB in the last 72 hours.  Invalid input(s): FREET3 ------------------------------------------------------------------------------------------------------------------ No results for input(s): VITAMINB12, FOLATE, FERRITIN, TIBC, IRON, RETICCTPCT in the last 72 hours.  Coagulation profile No results for input(s): INR, PROTIME in the last 168 hours.  No results for input(s): DDIMER in the last 72 hours.  Cardiac Enzymes No results for input(s): CKMB, TROPONINI, MYOGLOBIN in the last 168 hours.  Invalid input(s): CK ------------------------------------------------------------------------------------------------------------------ Invalid input(s): POCBNP    Assessment & Plan  Patient is a 67 year old lady with history of COPD, chronic tobacco abuse status post surgery noted to have acute respiratory failure   1.  Acute  hypoxic respiratory failure  At this point patient needs to be transferred to the ICU We will place her on BiPAP Obtain ABG I have discussed the case with the intensivist Continue IV antibiotic Patient already started on Lasix Follow chest x-ray       Code Status Orders  (From admission, onward)        Start     Ordered   02/25/18 1317  Full code  Continuous     02/25/18 1326    Code Status History    This patient has a current code status but no historical code status.    Advance Directive Documentation     Most Recent Value  Type of Advance Directive  Healthcare Power of Attorney, Living will  Pre-existing out of facility DNR order (yellow form or pink MOST form)  -  "MOST" Form in Place?  -             DVT Prophylaxis  Lovenox   Lab Results  Component Value Date   PLT 505 (H) 03/02/2018     Time Spent in minutes   45 minutes of critical care time spent greater than 50% of time spent in care coordination and counseling patient regarding the condition and plan of care.   Auburn Bilberry M.D on 03/02/2018 at  3:41 PM  Between 7am to 6pm - Pager - [phone]  After 6pm go to [email or web address] - Social research officer, government  [employer]   Office  [phone]

## 2018-03-02 NOTE — Progress Notes (Signed)
Follow up - Critical Care Medicine Note  Patient Details:    Annette Miller is an 67 y.o. female.with a history of COPD, tobacco abuse, duodenal ulcer gastritis diverticulosis, depression, goiter, status post perforated viscus, taken to the OR on same day, underwent ex lap with appendectomy, Hartmann sigmoidectomy and colostomy.  Patient was subsequently transferred to the ICU. Patient had worsening FiO2 requirements, was transferred to the intensive care unit, felt to have both pulmonary edema and atelectasis  Lines, Airways, Drains: PICC Triple Lumen 02/26/18 PICC Right Brachial 36 cm 4 cm (Active)  Indication for Insertion or Continuance of Line Administration of hyperosmolar/irritating solutions (i.e. TPN, Vancomycin, etc.) 03/01/2018  7:37 PM  Site Assessment Intact;Dry;Bleeding 03/01/2018  7:37 PM  Lumen #1 Status Infusing;Flushed 03/01/2018  7:37 PM  Lumen #2 Status Saline locked;Flushed 03/01/2018  7:37 PM  Lumen #3 Status Flushed;Saline locked 03/01/2018  7:37 PM  Dressing Type Transparent;Occlusive;Gauze 03/01/2018  7:37 PM  Dressing Status Clean;Dry;Intact;Antimicrobial disc in place 03/01/2018  7:37 PM  Line Care Connections checked and tightened 03/01/2018  7:37 PM  Dressing Intervention Other (Comment) 03/01/2018  7:37 PM  Dressing Change Due 03/05/18 03/01/2018  7:37 PM     Closed System Drain 1 Right Abdomen Bulb (JP) 19 Fr. (Active)  Site Description Unremarkable 03/02/2018  6:00 AM  Dressing Status Clean;Dry;Intact 03/02/2018  6:00 AM  Drainage Appearance Pink tinged 03/01/2018  8:00 PM  Status To suction (Charged) 03/01/2018  8:00 PM  Intake (mL) 80 ml 03/01/2018  2:15 AM  Output (mL) 45 mL 03/02/2018  6:10 AM     Closed System Drain 2 Left Abdomen Bulb (JP) 19 Fr. (Active)  Site Description Unable to view 03/01/2018  8:00 PM  Dressing Status Clean;Dry;Intact 03/01/2018  8:00 PM  Drainage Appearance Pink tinged 03/01/2018  8:00 PM  Status To suction (Charged) 03/01/2018  8:00 PM  Intake (mL) 30 ml  03/01/2018  2:15 AM  Output (mL) 30 mL 03/02/2018  6:10 AM     Colostomy LLQ (Active)  Ostomy Pouch 2 piece;Intact 03/01/2018  8:00 PM  Stoma Assessment Pink;Red 03/01/2018  8:00 PM  Peristomal Assessment Intact 03/01/2018  8:00 PM  Treatment Other (Comment) 03/01/2018  8:00 PM  Output (mL) 0 mL 03/02/2018  6:10 AM     Urethral Catheter Gordy Clement RN 16 Fr. (Active)  Indication for Insertion or Continuance of Catheter Aggressive IV diuresis 03/01/2018  8:00 PM  Site Assessment Clean;Intact;Dry 03/01/2018  8:00 PM  Catheter Maintenance Bag below level of bladder;Catheter secured;Drainage bag/tubing not touching floor;Seal intact;No dependent loops;Insertion date on drainage bag 03/01/2018  8:00 PM  Collection Container Dedicated Suction Canister 03/01/2018  8:00 PM  Securement Method Securing device (Describe) 03/01/2018  8:00 PM  Urinary Catheter Interventions Unclamped 03/01/2018  8:00 PM  Output (mL) 100 mL 03/02/2018  8:00 AM    Anti-infectives:  Anti-infectives (From admission, onward)   Start     Dose/Rate Route Frequency Ordered Stop   03/02/18 0400  vancomycin (VANCOCIN) IVPB 750 mg/150 ml premix     750 mg 150 mL/hr over 60 Minutes Intravenous Every 18 hours 03/01/18 1014     03/01/18 1015  vancomycin (VANCOCIN) IVPB 1000 mg/200 mL premix     1,000 mg 200 mL/hr over 60 Minutes Intravenous  Once 03/01/18 1012 03/01/18 1206   02/25/18 1600  piperacillin-tazobactam (ZOSYN) IVPB 3.375 g     3.375 g 12.5 mL/hr over 240 Minutes Intravenous Every 8 hours 02/25/18 1326     02/25/18 0645  vancomycin (VANCOCIN) IVPB 1000 mg/200 mL premix     1,000 mg 200 mL/hr over 60 Minutes Intravenous  Once 02/25/18 0639 02/25/18 0814   02/25/18 0630  piperacillin-tazobactam (ZOSYN) IVPB 3.375 g     3.375 g 100 mL/hr over 30 Minutes Intravenous  Once 02/25/18 1610 02/25/18 0715   02/25/18 0630  vancomycin (VANCOCIN) injection 1 g  Status:  Discontinued     1 g Intravenous  Once 02/25/18 9604 02/25/18 5409       Microbiology: Results for orders placed or performed during the hospital encounter of 02/25/18  Aerobic/Anaerobic Culture (surgical/deep wound)     Status: None (Preliminary result)   Collection Time: 02/25/18  9:50 AM  Result Value Ref Range Status   Specimen Description   Final    ABDOMEN FLUID Performed at [facility], [address] [place], [place] [address]    Special Requests   Final    NONE Performed at [facility], [address] [place], [place] [address]    Gram Stain   Final    ABUNDANT WBC PRESENT, PREDOMINANTLY PMN RARE GRAM POSITIVE COCCI Performed at [facility], [address], [place], [place] [address]    Culture   Final    RARE ESCHERICHIA COLI RARE PSEUDOMONAS AERUGINOSA NO ANAEROBES ISOLATED; CULTURE IN PROGRESS FOR 5 DAYS    Report Status PENDING  Incomplete   Organism ID, Bacteria ESCHERICHIA COLI  Final   Organism ID, Bacteria PSEUDOMONAS AERUGINOSA  Final      Susceptibility   Escherichia coli - MIC*    AMPICILLIN <=2 SENSITIVE Sensitive     CEFAZOLIN <=4 SENSITIVE Sensitive     CEFEPIME <=1 SENSITIVE Sensitive     CEFTAZIDIME <=1 SENSITIVE Sensitive     CEFTRIAXONE <=1 SENSITIVE Sensitive     CIPROFLOXACIN <=0.25 SENSITIVE Sensitive     GENTAMICIN <=1 SENSITIVE Sensitive     IMIPENEM <=0.25 SENSITIVE Sensitive     TRIMETH/SULFA <=20 SENSITIVE Sensitive     AMPICILLIN/SULBACTAM <=2 SENSITIVE Sensitive     PIP/TAZO <=4 SENSITIVE Sensitive     Extended ESBL NEGATIVE Sensitive     * RARE ESCHERICHIA COLI   Pseudomonas aeruginosa - MIC*    CEFTAZIDIME 4 SENSITIVE Sensitive     CIPROFLOXACIN <=0.25 SENSITIVE Sensitive     GENTAMICIN <=1 SENSITIVE Sensitive     IMIPENEM <=0.25 SENSITIVE Sensitive     PIP/TAZO 8 SENSITIVE Sensitive     CEFEPIME 2 SENSITIVE Sensitive     * RARE PSEUDOMONAS AERUGINOSA  MRSA PCR Screening     Status: None   Collection Time: 02/25/18  3:35 PM  Result Value Ref Range Status   MRSA  by PCR NEGATIVE NEGATIVE Final    Comment:        The GeneXpert MRSA Assay (FDA approved for NASAL specimens only), is one component of a comprehensive MRSA colonization surveillance program. It is not intended to diagnose MRSA infection nor to guide or monitor treatment for MRSA infections. Performed at [facility], [address] [place], [place] [address]   MRSA PCR Screening     Status: None   Collection Time: 03/01/18  1:55 PM  Result Value Ref Range Status   MRSA by PCR NEGATIVE NEGATIVE Final    Comment:        The GeneXpert MRSA Assay (FDA approved for NASAL specimens only), is one component of a comprehensive MRSA colonization surveillance program. It is not intended to diagnose MRSA infection nor  to guide or monitor treatment for MRSA infections. Performed at [facility], [name] [place], [place] [address]    Events:   Studies: Ct Angio Chest Pe W Or Wo Contrast  Result Date: 03/01/2018 CLINICAL DATA:  Desaturation, chest tightness. EXAM: CT ANGIOGRAPHY CHEST WITH CONTRAST TECHNIQUE: Multidetector CT imaging of the chest was performed using the standard protocol during bolus administration of intravenous contrast. Multiplanar CT image reconstructions and MIPs were obtained to evaluate the vascular anatomy. CONTRAST:  75mL ISOVUE-370 IOPAMIDOL (ISOVUE-370) INJECTION 76% COMPARISON:  Chest x-ray from earlier same day. FINDINGS: Cardiovascular: There is no pulmonary embolism identified within the main, lobar or segmental pulmonary arteries bilaterally. No thoracic aortic aneurysm or evidence of aortic dissection. Mild aortic atherosclerosis. Mild cardiomegaly. No significant pericardial effusion. Mediastinum/Nodes: No mass or enlarged lymph nodes seen within the mediastinum or perihilar regions. Esophagus appears normal. Trachea and central bronchi are unremarkable. Lungs/Pleura: Diffuse ground-glass opacities throughout both lungs, mid and  upper lobe predominant. Denser consolidation within the superior segment of the RIGHT lower lobe, highly suspicious for pneumonia. Small RIGHT pleural effusion. No pneumothorax. Upper Abdomen: No acute findings within the upper abdomen. Musculoskeletal: No acute or suspicious osseous finding. RIGHT humeral head/neck fracture, incompletely imaged, presumably chronic. Mild degenerative change within the thoracic spine. Review of the MIP images confirms the above findings. IMPRESSION: 1. Diffuse ground-glass opacities throughout both lungs, mid and upper lung predominant. Favor pulmonary edema. Differential includes atypical pneumonias such as viral or fungal, interstitial pneumonias, edema related to volume overload/CHF, chronic interstitial diseases, hypersensitivity pneumonitis, and respiratory bronchiolitis. 2. Probable RIGHT lower lobe pneumonia (superior segment of the RIGHT lower lobe). 3. Small RIGHT pleural effusion. 4. No pulmonary embolism. 5. RIGHT humeral head/neck fracture, incompletely imaged, presumably chronic. No previous x-rays to assess acuity. 6. Mild aortic atherosclerosis. Aortic Atherosclerosis (ICD10-I70.0). These results will be called to the ordering clinician or representative by the Radiologist Assistant, and communication documented in the PACS or zVision Dashboard. Electronically Signed   By: Bary Richard M.D.   On: 03/01/2018 09:13   Ct Abdomen Pelvis W Contrast  Result Date: 02/25/2018 CLINICAL DATA:  Acute onset of generalized abdominal pain and tenderness. Loss of 36 pounds over the past month. EXAM: CT ABDOMEN AND PELVIS WITH CONTRAST TECHNIQUE: Multidetector CT imaging of the abdomen and pelvis was performed using the standard protocol following bolus administration of intravenous contrast. CONTRAST:  75mL OMNIPAQUE IOHEXOL 300 MG/ML  SOLN COMPARISON:  CT of the abdomen and pelvis from 12/08/2017 FINDINGS: Lower chest: Mild right basilar atelectasis or scarring is noted.  Scattered coronary artery calcifications are seen. Hepatobiliary: The liver is grossly unremarkable. Trace free fluid is noted tracking about the liver. The gallbladder is diffusely distended. There is dilatation of the common bile duct to 1.0 cm in diameter, raising concern for distal obstruction. Pancreas: The pancreas is within normal limits. Spleen: The spleen is unremarkable in appearance. Adrenals/Urinary Tract: The adrenal glands are unremarkable in appearance. The kidneys are within normal limits. There is no evidence of hydronephrosis. No renal or ureteral stones are identified. No perinephric stranding is seen. Stomach/Bowel: Scattered free air is noted within the abdomen and pelvis, raising concern for bowel perforation. Mild complex fluid is noted tracking about large and small bowel loops. The site of bowel perforation is not definitely characterized on CT. There is diffuse wall thickening along the sigmoid colon, concerning for an underlying infectious or inflammatory colitis. The scattered pattern of air adjacent to the proximal sigmoid colon  may correspond to the site of perforation. Scattered diverticulosis is noted along the sigmoid colon. The small bowel is grossly unremarkable. Residual wall thickening is noted along the antrum of the stomach and proximal duodenum. Adjacent prominent nodes are again seen, and underlying mass is a concern. The appendix is unremarkable in appearance. Vascular/Lymphatic: Scattered calcification is seen along the abdominal aorta and its branches. The abdominal aorta is otherwise grossly unremarkable. The inferior vena cava is grossly unremarkable. No retroperitoneal lymphadenopathy is seen. No pelvic sidewall lymphadenopathy is identified. Reproductive: The bladder is mildly distended and grossly unremarkable. The patient is status post hysterectomy. No suspicious adnexal masses are seen. Other: No additional soft tissue abnormalities are seen. Musculoskeletal: No  acute osseous abnormalities are identified. The visualized musculature is unremarkable in appearance. IMPRESSION: 1. Bowel perforation, with scattered free air in the abdomen and pelvis. Mild complex fluid tracks about large and small bowel loops. The site of bowel perforation is not definitely characterized. It may be at the proximal sigmoid colon. 2. Diffuse wall thickening along the sigmoid colon is compatible with underlying infectious or inflammatory colitis. 3. Residual wall thickening along the antrum of the stomach and proximal duodenum. Adjacent prominent nodes again noted. Underlying mass is suspected. Given prior gastric ulceration, perforation in this region is also possibility. Endoscopy would be helpful for further evaluation, when and as deemed clinically appropriate. 4. Dilatation of the common bile duct to 1.0 cm in diameter, raising concern for distal obstruction. Diffuse gallbladder distention. Would correlate with LFTs. 5. Scattered diverticulosis along the sigmoid colon. Aortic Atherosclerosis (ICD10-I70.0). These results were called by telephone at the time of interpretation on 02/25/2018 at 6:09 am to Dr. York CeriseForbach, who verbally acknowledged these results. Electronically Signed   By: Roanna RaiderJeffery  Chang M.D.   On: 02/25/2018 06:15   Dg Chest Port 1 View  Result Date: 03/02/2018 CLINICAL DATA:  Respiratory failure. EXAM: PORTABLE CHEST 1 VIEW COMPARISON:  Radiograph of March 01, 2018. FINDINGS: The heart size and mediastinal contours are within normal limits. No pneumothorax or pleural effusion is noted. Right-sided PICC line is noted which is unchanged in position. Stable diffuse reticular densities are noted throughout both lungs most consistent with pulmonary edema, although atypical infection cannot be excluded. Stable proximal right humeral fracture is noted. IMPRESSION: Stable diffuse reticular densities are noted throughout both lungs most consistent with pulmonary edema, although atypical  infection cannot be excluded. Electronically Signed   By: Lupita RaiderJames  Green Jr, M.D.   On: 03/02/2018 09:10   Dg Chest Port 1 View  Result Date: 03/01/2018 CLINICAL DATA:  Acute onset of respiratory distress. Patient in abdominal surgery. EXAM: PORTABLE CHEST 1 VIEW COMPARISON:  Chest radiograph performed 02/26/2018 FINDINGS: Diffuse bilateral airspace opacification is new from the prior study and may reflect flash pulmonary edema or possibly pneumonia. ARDS cannot be excluded. A small right pleural effusion is noted. No pneumothorax is seen The cardiomediastinal silhouette is normal in size. A displaced right humeral head and neck fracture is noted. The patient's right PICC is noted ending about the mid SVC. IMPRESSION: 1. Diffuse bilateral airspace opacification is new from the prior study and may reflect flash pulmonary edema or possibly pneumonia. ARDS cannot be excluded. Small right pleural effusion noted. 2. Displaced right humeral head and neck fracture noted. Electronically Signed   By: Roanna RaiderJeffery  Chang M.D.   On: 03/01/2018 04:42   Dg Chest Port 1 View  Result Date: 02/26/2018 CLINICAL DATA:  67 year old female status post gastric tube placement EXAM:  PORTABLE CHEST 1 VIEW COMPARISON:  CT scan of the abdomen and pelvis 02/25/2018 FINDINGS: A nasogastric tube is present and projects over the left upper quadrant in the expected position. Cardiac and mediastinal contours are within normal limits. Atherosclerotic calcifications present in the transverse aorta. Mild left retrocardiac airspace opacity favored to reflect atelectasis. No pleural effusion or pneumothorax. No acute osseous abnormality. IMPRESSION: 1. Well-positioned gastrostomy tube. 2. Left retrocardiac airspace opacity may reflect atelectasis or infiltrate. Atelectasis is favored. 3.  Aortic Atherosclerosis (ICD10-170.0) Electronically Signed   By: Malachy Moan M.D.   On: 02/26/2018 14:15   [place] Ekg Site Rite  Result Date: 02/26/2018 If Site  Rite image not attached, placement could not be confirmed due to current cardiac rhythm.   Consults: Treatment Team:  Leafy Ro, MD Shane Crutch, MD [facility], [facility], MD   Subjective:    Overnight Issues: remain on BiPAP overnight.  Objective:  Vital signs for last 24 hours: Temp:  [98 F (36.7 C)-98.7 F (37.1 C)] 98 F (36.7 C) (06/05 0800) Pulse Rate:  [88-108] 95 (06/05 0600) Resp:  [17-32] 18 (06/05 0600) BP: (103-128)/(54-93) 112/63 (06/05 0600) SpO2:  [74 %-100 %] 94 % (06/05 0600) Weight:  [110 lb 3.7 oz (50 kg)] 110 lb 3.7 oz (50 kg) (06/04 1830)  Hemodynamic parameters for last 24 hours:    Intake/Output from previous day: 06/04 0701 - 06/05 0700 In: 590 [P.O.:240; IV Piggyback:350] Out: 2540 [Urine:2075; Drains:465]  Vent settings for last 24 hours:   Physical Exam:   Vital signs: Please see the above listed vital signs Patient is presently on BiPAP, states she feels comfortable right now HEENT: Limited oral exam, trachea is midline, no thyromegaly appreciated Cardiovascular: Regular rate and rhythm Pulmonary: Bilateral crackles with coarse rhonchi Abdominal: Soft exam positive bowel sounds Extremities: No clubbing cyanosis or edema appreciated   Assessment/Plan:   Hypoxemic respiratory failure. Reviewing chest x-ray and CT scan appears to be consistent with pulmonary edema/Lyme overload. Agree with diuretic therapy as blood pressure will allow, patient is on BiPAP, will wean to nasal cannula as tolerated. Agree with empiric treatment for pneumonia with vancomycin and Zosyn  Status post repair of perforated viscus  Anemia: No acute bleeding noted, most recent hemoglobin is 7.6  Hyponatremia. resolved  Leukocytosis. Last white count was 9.9  Critical care time 35 minutes   Xylon Croom 03/02/2018  *Care during the described time interval was provided by me and/or other providers on the critical care team.  I have reviewed this  patient's available data, including medical history, events of note, physical examination and test results as part of my evaluation.

## 2018-03-03 LAB — GLUCOSE, CAPILLARY
Glucose-Capillary: 101 mg/dL — ABNORMAL HIGH (ref 65–99)
Glucose-Capillary: 106 mg/dL — ABNORMAL HIGH (ref 65–99)
Glucose-Capillary: 72 mg/dL (ref 65–99)
Glucose-Capillary: 79 mg/dL (ref 65–99)
Glucose-Capillary: 82 mg/dL (ref 65–99)

## 2018-03-03 LAB — BASIC METABOLIC PANEL
ANION GAP: 12 (ref 5–15)
BUN: 10 mg/dL (ref 6–20)
CHLORIDE: 97 mmol/L — AB (ref 101–111)
CO2: 27 mmol/L (ref 22–32)
Calcium: 7.3 mg/dL — ABNORMAL LOW (ref 8.9–10.3)
Creatinine, Ser: 0.49 mg/dL (ref 0.44–1.00)
GFR calc non Af Amer: >60 mL/min (ref >60)
Glucose, Bld: 82 mg/dL (ref 65–99)
POTASSIUM: 3.4 mmol/L — AB (ref 3.5–5.1)
SODIUM: 136 mmol/L (ref 135–145)

## 2018-03-03 LAB — CBC WITH DIFFERENTIAL/PLATELET
BASOS ABS: 0.1 10*3/uL (ref 0–0.1)
Basophils Relative: 1 %
EOS ABS: 0.2 10*3/uL (ref 0–0.7)
EOS PCT: 3 %
HCT: 21.2 % — ABNORMAL LOW (ref 35.0–47.0)
HEMOGLOBIN: 7.4 g/dL — AB (ref 12.0–16.0)
LYMPHS ABS: 1 10*3/uL (ref 1.0–3.6)
Lymphocytes Relative: 11 %
MCH: 32.2 pg (ref 26.0–34.0)
MCHC: 35 g/dL (ref 32.0–36.0)
MCV: 91.9 fL (ref 80.0–100.0)
Monocytes Absolute: 0.4 10*3/uL (ref 0.2–0.9)
Monocytes Relative: 4 %
NEUTROS PCT: 81 %
Neutro Abs: 7.2 10*3/uL — ABNORMAL HIGH (ref 1.4–6.5)
PLATELETS: 526 10*3/uL — AB (ref 150–440)
RBC: 2.3 MIL/uL — AB (ref 3.80–5.20)
RDW: 14.7 % — ABNORMAL HIGH (ref 11.5–14.5)
WBC: 9 10*3/uL (ref 3.6–11.0)

## 2018-03-03 LAB — MAGNESIUM: MAGNESIUM: 1.8 mg/dL (ref 1.7–2.4)

## 2018-03-03 MED ORDER — BOOST / RESOURCE BREEZE PO LIQD CUSTOM
1.0000 | Freq: Three times a day (TID) | ORAL | Status: DC
  Administered 2018-03-03 – 2018-03-06 (×7): 1 via ORAL

## 2018-03-03 MED ORDER — FUROSEMIDE 10 MG/ML IJ SOLN
20.0000 mg | Freq: Every day | INTRAMUSCULAR | Status: DC
  Administered 2018-03-04 – 2018-03-07 (×4): 20 mg via INTRAVENOUS
  Filled 2018-03-03: qty 2
  Filled 2018-03-03 (×3): qty 4

## 2018-03-03 MED ORDER — FUROSEMIDE 10 MG/ML IJ SOLN
20.0000 mg | Freq: Once | INTRAMUSCULAR | Status: AC
  Administered 2018-03-03: 20 mg via INTRAVENOUS
  Filled 2018-03-03: qty 2

## 2018-03-03 MED ORDER — POTASSIUM CHLORIDE CRYS ER 20 MEQ PO TBCR
40.0000 meq | EXTENDED_RELEASE_TABLET | Freq: Once | ORAL | Status: AC
  Administered 2018-03-03: 40 meq via ORAL
  Filled 2018-03-03: qty 2

## 2018-03-03 MED ORDER — POTASSIUM CHLORIDE 10 MEQ/50ML IV SOLN
10.0000 meq | INTRAVENOUS | Status: AC
  Administered 2018-03-03 (×2): 10 meq via INTRAVENOUS
  Filled 2018-03-03 (×2): qty 50

## 2018-03-03 NOTE — Progress Notes (Addendum)
[employer] - [place] at [facility]                                                                                                                                                                                  Patient Demographics   Annette Miller, is a 67 y.o. female, DOB - [birth year], WRU:[id number]  Admit date - 02/25/2018   Admitting Physician Leafy Roiego F Pabon, MD  Outpatient Primary MD for the patient is Steele Sizerrissman, Mark A, MD   LOS - 6  Subjective: Patient on BiPAP feeling better  Review of Systems:   CONSTITUTIONAL: No documented fever. No fatigue, weakness. No weight gain, no weight loss.  EYES: No blurry or double vision.  ENT: No tinnitus. No postnasal drip. No redness of the oropharynx.  RESPIRATORY: No cough, no wheeze, no hemoptysis.  Positive dyspnea.  CARDIOVASCULAR: No chest pain. No orthopnea. No palpitations. No syncope.  GASTROINTESTINAL: No nausea, no vomiting or diarrhea.  Positive abdominal pain. No melena or hematochezia.  GENITOURINARY: No dysuria or hematuria.  ENDOCRINE: No polyuria or nocturia. No heat or cold intolerance.  HEMATOLOGY: No anemia. No bruising. No bleeding.  INTEGUMENTARY: No rashes. No lesions.  MUSCULOSKELETAL: No arthritis. No swelling. No gout.  NEUROLOGIC: No numbness, tingling, or ataxia. No seizure-type activity.  PSYCHIATRIC: No anxiety. No insomnia. No ADD.    Vitals:   Vitals:   03/03/18 0900 03/03/18 1000 03/03/18 1400 03/03/18 1500  BP: 120/62 124/66 118/62 116/66  Pulse: 93 (!) 102 (!) 105 (!) 103  Resp: (!) 24 (!) 27 (!) 22 (!) 26  Temp:      TempSrc:      SpO2: 100% 93% 98% (!) 89%  Weight:      Height:        Wt Readings from Last 3 Encounters:  03/01/18 50 kg (110 lb 3.7 oz)  01/14/18 48.5 kg (107 lb)  01/13/18 48.5 kg (107 lb)     Intake/Output Summary (Last 24 hours) at 03/03/2018 1541 Last data filed at 03/03/2018 1025 Gross per 24 hour  Intake 410 ml  Output 1055 ml  Net -645 ml     Physical Exam:   GENERAL: Pleasant-appearing in no apparent distress.  HEAD, EYES, EARS, NOSE AND THROAT: Atraumatic, normocephalic. Extraocular muscles are intact. Pupils equal and reactive to light. Sclerae anicteric. No conjunctival injection. No oro-pharyngeal erythema.  NECK: Supple. There is no jugular venous distention. No bruits, no lymphadenopathy, no thyromegaly.  HEART: Regular rate and rhythm,. No murmurs, no rubs, no clicks.  LUNGS: Rhonchus breath sounds bilaterally ABDOMEN: Soft, postop EXTREMITIES: No evidence of any cyanosis, clubbing, or peripheral edema.  +2 pedal and radial pulses bilaterally.  NEUROLOGIC: The patient is alert, awake, and oriented x3 with no focal motor or sensory deficits appreciated bilaterally.  SKIN: Moist and warm with no rashes appreciated.  Psych: Not anxious, depressed LN: No inguinal LN enlargement    Antibiotics   Anti-infectives (From admission, onward)   Start     Dose/Rate Route Frequency Ordered Stop   03/02/18 1500  meropenem (MERREM) 1 g in sodium chloride 0.9 % 100 mL IVPB  Status:  Discontinued     1 g 200 mL/hr over 30 Minutes Intravenous Every 12 hours 03/02/18 1149 03/02/18 1151   03/02/18 1400  meropenem (MERREM) 1 g in sodium chloride 0.9 % 100 mL IVPB     1 g 200 mL/hr over 30 Minutes Intravenous Every 8 hours 03/02/18 1151     03/02/18 0400  vancomycin (VANCOCIN) IVPB 750 mg/150 ml premix     750 mg 150 mL/hr over 60 Minutes Intravenous Every 18 hours 03/01/18 1014     03/01/18 1015  vancomycin (VANCOCIN) IVPB 1000 mg/200 mL premix     1,000 mg 200 mL/hr over 60 Minutes Intravenous  Once 03/01/18 1012 03/01/18 1206   02/25/18 1600  piperacillin-tazobactam (ZOSYN) IVPB 3.375 g  Status:  Discontinued     3.375 g 12.5 mL/hr over 240 Minutes Intravenous Every 8 hours 02/25/18 1326 03/02/18 1037   02/25/18 0645  vancomycin (VANCOCIN) IVPB 1000 mg/200 mL premix     1,000 mg 200 mL/hr over 60 Minutes Intravenous  Once  02/25/18 0639 02/25/18 0814   02/25/18 0630  piperacillin-tazobactam (ZOSYN) IVPB 3.375 g     3.375 g 100 mL/hr over 30 Minutes Intravenous  Once 02/25/18 0619 02/25/18 0715   02/25/18 0630  vancomycin (VANCOCIN) injection 1 g  Status:  Discontinued     1 g Intravenous  Once 02/25/18 0619 02/25/18 0639      Medications   Scheduled Meds: . enoxaparin (LOVENOX) injection  40 mg Subcutaneous Q24H  . feeding supplement  1 Container Oral TID BM  . [START ON 03/04/2018] furosemide  20 mg Intravenous Daily  . ketorolac  15 mg Intravenous Q6H  . levothyroxine  25 mcg Oral QAC breakfast  . mouth rinse  15 mL Mouth Rinse BID  . sodium chloride flush  10-40 mL Intracatheter Q12H   Continuous Infusions: . meropenem (MERREM) IV Stopped (03/03/18 1350)  . vancomycin 750 mg (03/03/18 1518)   PRN Meds:.acetaminophen, ipratropium-albuterol, morphine injection, ondansetron **OR** ondansetron (ZOFRAN) IV, oxyCODONE, sodium chloride flush   Data Review:   Micro Results Recent Results (from the past 240 hour(s))  Aerobic/Anaerobic Culture (surgical/deep wound)     Status: None   Collection Time: 02/25/18  9:50 AM  Result Value Ref Range Status   Specimen Description   Final    ABDOMEN FLUID Performed at [facility], [address], [place], [place] [address]    Special Requests   Final    NONE Performed at [facility], [address] [place], [place] [address]    Gram Stain   Final    ABUNDANT WBC PRESENT, PREDOMINANTLY PMN RARE GRAM POSITIVE COCCI    Culture   Final    RARE ESCHERICHIA COLI RARE PSEUDOMONAS AERUGINOSA NO ANAEROBES ISOLATED Performed at [facility], [address], [place], [place] [address]    Report Status 03/02/2018 FINAL  Final   Organism ID, Bacteria ESCHERICHIA COLI  Final   Organism ID, Bacteria PSEUDOMONAS AERUGINOSA  Final      Susceptibility  Escherichia coli - MIC*    AMPICILLIN <=2 SENSITIVE Sensitive     CEFAZOLIN <=4  SENSITIVE Sensitive     CEFEPIME <=1 SENSITIVE Sensitive     CEFTAZIDIME <=1 SENSITIVE Sensitive     CEFTRIAXONE <=1 SENSITIVE Sensitive     CIPROFLOXACIN <=0.25 SENSITIVE Sensitive     GENTAMICIN <=1 SENSITIVE Sensitive     IMIPENEM <=0.25 SENSITIVE Sensitive     TRIMETH/SULFA <=20 SENSITIVE Sensitive     AMPICILLIN/SULBACTAM <=2 SENSITIVE Sensitive     PIP/TAZO <=4 SENSITIVE Sensitive     Extended ESBL NEGATIVE Sensitive     * RARE ESCHERICHIA COLI   Pseudomonas aeruginosa - MIC*    CEFTAZIDIME 4 SENSITIVE Sensitive     CIPROFLOXACIN <=0.25 SENSITIVE Sensitive     GENTAMICIN <=1 SENSITIVE Sensitive     IMIPENEM <=0.25 SENSITIVE Sensitive     PIP/TAZO 8 SENSITIVE Sensitive     CEFEPIME 2 SENSITIVE Sensitive     * RARE PSEUDOMONAS AERUGINOSA  MRSA PCR Screening     Status: None   Collection Time: 02/25/18  3:35 PM  Result Value Ref Range Status   MRSA by PCR NEGATIVE NEGATIVE Final    Comment:        The GeneXpert MRSA Assay (FDA approved for NASAL specimens only), is one component of a comprehensive MRSA colonization surveillance program. It is not intended to diagnose MRSA infection nor to guide or monitor treatment for MRSA infections. Performed at [facility], [address] [place], [place] [address]   MRSA PCR Screening     Status: None   Collection Time: 03/01/18  1:55 PM  Result Value Ref Range Status   MRSA by PCR NEGATIVE NEGATIVE Final    Comment:        The GeneXpert MRSA Assay (FDA approved for NASAL specimens only), is one component of a comprehensive MRSA colonization surveillance program. It is not intended to diagnose MRSA infection nor to guide or monitor treatment for MRSA infections. Performed at [facility], [address], [place], [place] [address]     Radiology Reports Ct Angio Chest Pe W Or Wo Contrast  Result Date: 03/01/2018 CLINICAL DATA:  Desaturation, chest tightness. EXAM: CT ANGIOGRAPHY CHEST WITH CONTRAST  TECHNIQUE: Multidetector CT imaging of the chest was performed using the standard protocol during bolus administration of intravenous contrast. Multiplanar CT image reconstructions and MIPs were obtained to evaluate the vascular anatomy. CONTRAST:  75mL ISOVUE-370 IOPAMIDOL (ISOVUE-370) INJECTION 76% COMPARISON:  Chest x-ray from earlier same day. FINDINGS: Cardiovascular: There is no pulmonary embolism identified within the main, lobar or segmental pulmonary arteries bilaterally. No thoracic aortic aneurysm or evidence of aortic dissection. Mild aortic atherosclerosis. Mild cardiomegaly. No significant pericardial effusion. Mediastinum/Nodes: No mass or enlarged lymph nodes seen within the mediastinum or perihilar regions. Esophagus appears normal. Trachea and central bronchi are unremarkable. Lungs/Pleura: Diffuse ground-glass opacities throughout both lungs, mid and upper lobe predominant. Denser consolidation within the superior segment of the RIGHT lower lobe, highly suspicious for pneumonia. Small RIGHT pleural effusion. No pneumothorax. Upper Abdomen: No acute findings within the upper abdomen. Musculoskeletal: No acute or suspicious osseous finding. RIGHT humeral head/neck fracture, incompletely imaged, presumably chronic. Mild degenerative change within the thoracic spine. Review of the MIP images confirms the above findings. IMPRESSION: 1. Diffuse ground-glass opacities throughout both lungs, mid and upper lung predominant. Favor pulmonary edema. Differential includes atypical pneumonias such as viral or fungal, interstitial pneumonias, edema related to volume overload/CHF, chronic interstitial diseases, hypersensitivity  pneumonitis, and respiratory bronchiolitis. 2. Probable RIGHT lower lobe pneumonia (superior segment of the RIGHT lower lobe). 3. Small RIGHT pleural effusion. 4. No pulmonary embolism. 5. RIGHT humeral head/neck fracture, incompletely imaged, presumably chronic. No previous x-rays to  assess acuity. 6. Mild aortic atherosclerosis. Aortic Atherosclerosis (ICD10-I70.0). These results will be called to the ordering clinician or representative by the Radiologist Assistant, and communication documented in the PACS or zVision Dashboard. Electronically Signed   By: Bary Richard M.D.   On: 03/01/2018 09:13   Ct Abdomen Pelvis W Contrast  Result Date: 02/25/2018 CLINICAL DATA:  Acute onset of generalized abdominal pain and tenderness. Loss of 36 pounds over the past month. EXAM: CT ABDOMEN AND PELVIS WITH CONTRAST TECHNIQUE: Multidetector CT imaging of the abdomen and pelvis was performed using the standard protocol following bolus administration of intravenous contrast. CONTRAST:  75mL OMNIPAQUE IOHEXOL 300 MG/ML  SOLN COMPARISON:  CT of the abdomen and pelvis from 12/08/2017 FINDINGS: Lower chest: Mild right basilar atelectasis or scarring is noted. Scattered coronary artery calcifications are seen. Hepatobiliary: The liver is grossly unremarkable. Trace free fluid is noted tracking about the liver. The gallbladder is diffusely distended. There is dilatation of the common bile duct to 1.0 cm in diameter, raising concern for distal obstruction. Pancreas: The pancreas is within normal limits. Spleen: The spleen is unremarkable in appearance. Adrenals/Urinary Tract: The adrenal glands are unremarkable in appearance. The kidneys are within normal limits. There is no evidence of hydronephrosis. No renal or ureteral stones are identified. No perinephric stranding is seen. Stomach/Bowel: Scattered free air is noted within the abdomen and pelvis, raising concern for bowel perforation. Mild complex fluid is noted tracking about large and small bowel loops. The site of bowel perforation is not definitely characterized on CT. There is diffuse wall thickening along the sigmoid colon, concerning for an underlying infectious or inflammatory colitis. The scattered pattern of air adjacent to the proximal sigmoid  colon may correspond to the site of perforation. Scattered diverticulosis is noted along the sigmoid colon. The small bowel is grossly unremarkable. Residual wall thickening is noted along the antrum of the stomach and proximal duodenum. Adjacent prominent nodes are again seen, and underlying mass is a concern. The appendix is unremarkable in appearance. Vascular/Lymphatic: Scattered calcification is seen along the abdominal aorta and its branches. The abdominal aorta is otherwise grossly unremarkable. The inferior vena cava is grossly unremarkable. No retroperitoneal lymphadenopathy is seen. No pelvic sidewall lymphadenopathy is identified. Reproductive: The bladder is mildly distended and grossly unremarkable. The patient is status post hysterectomy. No suspicious adnexal masses are seen. Other: No additional soft tissue abnormalities are seen. Musculoskeletal: No acute osseous abnormalities are identified. The visualized musculature is unremarkable in appearance. IMPRESSION: 1. Bowel perforation, with scattered free air in the abdomen and pelvis. Mild complex fluid tracks about large and small bowel loops. The site of bowel perforation is not definitely characterized. It may be at the proximal sigmoid colon. 2. Diffuse wall thickening along the sigmoid colon is compatible with underlying infectious or inflammatory colitis. 3. Residual wall thickening along the antrum of the stomach and proximal duodenum. Adjacent prominent nodes again noted. Underlying mass is suspected. Given prior gastric ulceration, perforation in this region is also possibility. Endoscopy would be helpful for further evaluation, when and as deemed clinically appropriate. 4. Dilatation of the common bile duct to 1.0 cm in diameter, raising concern for distal obstruction. Diffuse gallbladder distention. Would correlate with LFTs. 5. Scattered diverticulosis along the sigmoid colon.  Aortic Atherosclerosis (ICD10-I70.0). These results were called  by telephone at the time of interpretation on 02/25/2018 at 6:09 am to Dr. York Cerise, who verbally acknowledged these results. Electronically Signed   By: Roanna Raider M.D.   On: 02/25/2018 06:15   Dg Chest Port 1 View  Result Date: 03/02/2018 CLINICAL DATA:  Respiratory failure. EXAM: PORTABLE CHEST 1 VIEW COMPARISON:  Radiograph of March 01, 2018. FINDINGS: The heart size and mediastinal contours are within normal limits. No pneumothorax or pleural effusion is noted. Right-sided PICC line is noted which is unchanged in position. Stable diffuse reticular densities are noted throughout both lungs most consistent with pulmonary edema, although atypical infection cannot be excluded. Stable proximal right humeral fracture is noted. IMPRESSION: Stable diffuse reticular densities are noted throughout both lungs most consistent with pulmonary edema, although atypical infection cannot be excluded. Electronically Signed   By: Lupita Raider, M.D.   On: 03/02/2018 09:10   Dg Chest Port 1 View  Result Date: 03/01/2018 CLINICAL DATA:  Acute onset of respiratory distress. Patient in abdominal surgery. EXAM: PORTABLE CHEST 1 VIEW COMPARISON:  Chest radiograph performed 02/26/2018 FINDINGS: Diffuse bilateral airspace opacification is new from the prior study and may reflect flash pulmonary edema or possibly pneumonia. ARDS cannot be excluded. A small right pleural effusion is noted. No pneumothorax is seen The cardiomediastinal silhouette is normal in size. A displaced right humeral head and neck fracture is noted. The patient's right PICC is noted ending about the mid SVC. IMPRESSION: 1. Diffuse bilateral airspace opacification is new from the prior study and may reflect flash pulmonary edema or possibly pneumonia. ARDS cannot be excluded. Small right pleural effusion noted. 2. Displaced right humeral head and neck fracture noted. Electronically Signed   By: Roanna Raider M.D.   On: 03/01/2018 04:42   Dg Chest Port 1  View  Result Date: 02/26/2018 CLINICAL DATA:  67 year old female status post gastric tube placement EXAM: PORTABLE CHEST 1 VIEW COMPARISON:  CT scan of the abdomen and pelvis 02/25/2018 FINDINGS: A nasogastric tube is present and projects over the left upper quadrant in the expected position. Cardiac and mediastinal contours are within normal limits. Atherosclerotic calcifications present in the transverse aorta. Mild left retrocardiac airspace opacity favored to reflect atelectasis. No pleural effusion or pneumothorax. No acute osseous abnormality. IMPRESSION: 1. Well-positioned gastrostomy tube. 2. Left retrocardiac airspace opacity may reflect atelectasis or infiltrate. Atelectasis is favored. 3.  Aortic Atherosclerosis (ICD10-170.0) Electronically Signed   By: Malachy Moan M.D.   On: 02/26/2018 14:15   [place] Ekg Site Rite  Result Date: 02/26/2018 If Site Rite image not attached, placement could not be confirmed due to current cardiac rhythm.    CBC Recent Labs  Lab 02/25/18 0323 02/25/18 1440 02/26/18 0413 02/27/18 0804 02/28/18 0512 03/01/18 0626 03/02/18 0507 03/03/18 0410  WBC 8.5 6.5 7.8 10.2 16.9* 18.9* 9.9 9.0  HGB 10.4* 8.2* 7.4* 8.3* 8.4* 8.7* 7.6* 7.4*  HCT 30.0* 23.6* 22.6* 24.1* 24.8* 26.0* 22.0* 21.2*  PLT 1,080* 698* 740* 622* 690* 715* 505* 526*  MCV 95.3 96.3 95.1 92.5 91.5 92.0 92.0 91.9  MCH 33.1 33.4 31.2 31.9 30.8 30.8 32.0 32.2  MCHC 34.8 34.6 32.8 34.5 33.7 33.4 34.7 35.0  RDW 14.3 14.0 14.6* 16.2* 15.9* 15.5* 15.0* 14.7*  LYMPHSABS 1.4 0.5* 0.9* 1.1  --   --   --  1.0  MONOABS 0.2 0.2 0.3 0.4  --   --   --  0.4  EOSABS 0.0  0.0 0.0 0.1  --   --   --  0.2  BASOSABS 0.0 0.0 0.0 0.0  --   --   --  0.1    Chemistries  Recent Labs  Lab 02/25/18 0323  02/26/18 0413 02/27/18 0804 02/28/18 0512 03/01/18 0626 03/02/18 0507 03/03/18 0410  NA 136   < > 133* 134* 132* 131* 136 136  K 3.2*   < > 3.8 4.0 3.9 4.1 3.8 3.4*  CL 93*   < > 103 104 101 99* 99* 97*   CO2 26   < > 24 24 25 25 30 27   GLUCOSE 96   < > 133* 94 121* 115* 93 82  BUN 18   < > 12 7 <5* <5* 6 10  CREATININE 0.99   < > 0.61 0.52 0.50 0.47 0.49 0.49  CALCIUM 8.9   < > 7.1* 7.4* 7.2* 7.2* 7.1* 7.3*  MG  --   --  1.7 1.9  --   --   --  1.8  AST 35  --  24 21  --   --   --   --   ALT 10*  --  11* 11*  --   --   --   --   ALKPHOS 107  --  68 74  --   --   --   --   BILITOT 0.5  --  0.2* 0.7  --   --   --   --    < > = values in this interval not displayed.   ------------------------------------------------------------------------------------------------------------------ estimated creatinine clearance is 54.6 mL/min (by C-G formula based on SCr of 0.49 mg/dL). ------------------------------------------------------------------------------------------------------------------ No results for input(s): HGBA1C in the last 72 hours. ------------------------------------------------------------------------------------------------------------------ No results for input(s): CHOL, HDL, LDLCALC, TRIG, CHOLHDL, LDLDIRECT in the last 72 hours. ------------------------------------------------------------------------------------------------------------------ No results for input(s): TSH, T4TOTAL, T3FREE, THYROIDAB in the last 72 hours.  Invalid input(s): FREET3 ------------------------------------------------------------------------------------------------------------------ No results for input(s): VITAMINB12, FOLATE, FERRITIN, TIBC, IRON, RETICCTPCT in the last 72 hours.  Coagulation profile No results for input(s): INR, PROTIME in the last 168 hours.  No results for input(s): DDIMER in the last 72 hours.  Cardiac Enzymes No results for input(s): CKMB, TROPONINI, MYOGLOBIN in the last 168 hours.  Invalid input(s): CK ------------------------------------------------------------------------------------------------------------------ Invalid input(s): POCBNP    Assessment & Plan  Patient  is a 67 year old lady with history of COPD, chronic tobacco abuse status post surgery noted to have acute respiratory failure Patient is a 67 year old lady with history of COPD, chronic tobacco abuse status post surgery noted to have acute respiratory failure   1.  Acute hypoxic respiratory failure differential diagnosis based on the CT scan  Possible atypical pneumonia, ARDS Continue vancomycin and meropenem Echo of the heart shows no significant dysfunction Continue BiPAP wean as tolerated Appreciate intensivist input  2.  Sinus tachycardia likely due to #1  3.  Hypothyroidism continue Synthroid  4.  GERD continue Protonix  5.  Hyperlipidemia continue Crestor  6.  Nicotine abuse smoking cessation provided  7.  Acute blood loss anemia follow hemoglobin may need transfusion      Code Status Orders  (From admission, onward)        Start     Ordered   02/25/18 1317  Full code  Continuous     02/25/18 1326    Code Status History    This patient has a current code status but no historical code status.    Advance Directive Documentation  Most Recent Value  Type of Advance Directive  Healthcare Power of Attorney, Living will  Pre-existing out of facility DNR order (yellow form or pink MOST form)  -  "MOST" Form in Place?  -             DVT Prophylaxis  Lovenox   Lab Results  Component Value Date   PLT 526 (H) 03/03/2018     Time Spent in minutes   35 minutes spent  Auburn Bilberry M.D on 03/03/2018 at 3:41 PM  Between 7am to 6pm - Pager - [phone]  After 6pm go to [email or web address] - Social research officer, government  [employer]   Office  [phone]

## 2018-03-03 NOTE — Progress Notes (Signed)
Follow up - Critical Care Medicine Note  Patient Details:    Annette Miller is an 67 y.o. female.with a history of COPD, tobacco abuse, duodenal ulcer gastritis diverticulosis, depression, goiter, status post perforated viscus, taken to the OR on same day, underwent ex lap with appendectomy, Hartmann sigmoidectomy and colostomy.  Patient was subsequently transferred to the ICU. Patient had worsening FiO2 requirements, was transferred to the intensive care unit, felt to have both pulmonary edema and atelectasis  Lines, Airways, Drains: PICC Triple Lumen 02/26/18 PICC Right Brachial 36 cm 4 cm (Active)  Indication for Insertion or Continuance of Line Administration of hyperosmolar/irritating solutions (i.e. TPN, Vancomycin, etc.) 03/01/2018  7:37 PM  Site Assessment Intact;Dry;Bleeding 03/01/2018  7:37 PM  Lumen #1 Status Infusing;Flushed 03/01/2018  7:37 PM  Lumen #2 Status Saline locked;Flushed 03/01/2018  7:37 PM  Lumen #3 Status Flushed;Saline locked 03/01/2018  7:37 PM  Dressing Type Transparent;Occlusive;Gauze 03/01/2018  7:37 PM  Dressing Status Clean;Dry;Intact;Antimicrobial disc in place 03/01/2018  7:37 PM  Line Care Connections checked and tightened 03/01/2018  7:37 PM  Dressing Intervention Other (Comment) 03/01/2018  7:37 PM  Dressing Change Due 03/05/18 03/01/2018  7:37 PM     Closed System Drain 1 Right Abdomen Bulb (JP) 19 Fr. (Active)  Site Description Unremarkable 03/02/2018  6:00 AM  Dressing Status Clean;Dry;Intact 03/02/2018  6:00 AM  Drainage Appearance Pink tinged 03/01/2018  8:00 PM  Status To suction (Charged) 03/01/2018  8:00 PM  Intake (mL) 80 ml 03/01/2018  2:15 AM  Output (mL) 45 mL 03/02/2018  6:10 AM     Closed System Drain 2 Left Abdomen Bulb (JP) 19 Fr. (Active)  Site Description Unable to view 03/01/2018  8:00 PM  Dressing Status Clean;Dry;Intact 03/01/2018  8:00 PM  Drainage Appearance Pink tinged 03/01/2018  8:00 PM  Status To suction (Charged) 03/01/2018  8:00 PM  Intake (mL) 30 ml  03/01/2018  2:15 AM  Output (mL) 30 mL 03/02/2018  6:10 AM     Colostomy LLQ (Active)  Ostomy Pouch 2 piece;Intact 03/01/2018  8:00 PM  Stoma Assessment Pink;Red 03/01/2018  8:00 PM  Peristomal Assessment Intact 03/01/2018  8:00 PM  Treatment Other (Comment) 03/01/2018  8:00 PM  Output (mL) 0 mL 03/02/2018  6:10 AM     Urethral Catheter Gordy Clement RN 16 Fr. (Active)  Indication for Insertion or Continuance of Catheter Aggressive IV diuresis 03/01/2018  8:00 PM  Site Assessment Clean;Intact;Dry 03/01/2018  8:00 PM  Catheter Maintenance Bag below level of bladder;Catheter secured;Drainage bag/tubing not touching floor;Seal intact;No dependent loops;Insertion date on drainage bag 03/01/2018  8:00 PM  Collection Container Dedicated Suction Canister 03/01/2018  8:00 PM  Securement Method Securing device (Describe) 03/01/2018  8:00 PM  Urinary Catheter Interventions Unclamped 03/01/2018  8:00 PM  Output (mL) 100 mL 03/02/2018  8:00 AM    Anti-infectives:  Anti-infectives (From admission, onward)   Start     Dose/Rate Route Frequency Ordered Stop   03/02/18 1500  meropenem (MERREM) 1 g in sodium chloride 0.9 % 100 mL IVPB  Status:  Discontinued     1 g 200 mL/hr over 30 Minutes Intravenous Every 12 hours 03/02/18 1149 03/02/18 1151   03/02/18 1400  meropenem (MERREM) 1 g in sodium chloride 0.9 % 100 mL IVPB     1 g 200 mL/hr over 30 Minutes Intravenous Every 8 hours 03/02/18 1151     03/02/18 0400  vancomycin (VANCOCIN) IVPB 750 mg/150 ml premix     750 mg  150 mL/hr over 60 Minutes Intravenous Every 18 hours 03/01/18 1014     03/01/18 1015  vancomycin (VANCOCIN) IVPB 1000 mg/200 mL premix     1,000 mg 200 mL/hr over 60 Minutes Intravenous  Once 03/01/18 1012 03/01/18 1206   02/25/18 1600  piperacillin-tazobactam (ZOSYN) IVPB 3.375 g  Status:  Discontinued     3.375 g 12.5 mL/hr over 240 Minutes Intravenous Every 8 hours 02/25/18 1326 03/02/18 1037   02/25/18 0645  vancomycin (VANCOCIN) IVPB 1000 mg/200 mL  premix     1,000 mg 200 mL/hr over 60 Minutes Intravenous  Once 02/25/18 0639 02/25/18 0814   02/25/18 0630  piperacillin-tazobactam (ZOSYN) IVPB 3.375 g     3.375 g 100 mL/hr over 30 Minutes Intravenous  Once 02/25/18 0619 02/25/18 0715   02/25/18 0630  vancomycin (VANCOCIN) injection 1 g  Status:  Discontinued     1 g Intravenous  Once 02/25/18 1610 02/25/18 9604      Microbiology: Results for orders placed or performed during the hospital encounter of 02/25/18  Aerobic/Anaerobic Culture (surgical/deep wound)     Status: None   Collection Time: 02/25/18  9:50 AM  Result Value Ref Range Status   Specimen Description   Final    ABDOMEN FLUID Performed at [facility], [address] [place], [place] [address]    Special Requests   Final    NONE Performed at [facility], [address] [place], [place] [address]    Gram Stain   Final    ABUNDANT WBC PRESENT, PREDOMINANTLY PMN RARE GRAM POSITIVE COCCI    Culture   Final    RARE ESCHERICHIA COLI RARE PSEUDOMONAS AERUGINOSA NO ANAEROBES ISOLATED Performed at [facility], [address] [place], [place] [address]    Report Status 03/02/2018 FINAL  Final   Organism ID, Bacteria ESCHERICHIA COLI  Final   Organism ID, Bacteria PSEUDOMONAS AERUGINOSA  Final      Susceptibility   Escherichia coli - MIC*    AMPICILLIN <=2 SENSITIVE Sensitive     CEFAZOLIN <=4 SENSITIVE Sensitive     CEFEPIME <=1 SENSITIVE Sensitive     CEFTAZIDIME <=1 SENSITIVE Sensitive     CEFTRIAXONE <=1 SENSITIVE Sensitive     CIPROFLOXACIN <=0.25 SENSITIVE Sensitive     GENTAMICIN <=1 SENSITIVE Sensitive     IMIPENEM <=0.25 SENSITIVE Sensitive     TRIMETH/SULFA <=20 SENSITIVE Sensitive     AMPICILLIN/SULBACTAM <=2 SENSITIVE Sensitive     PIP/TAZO <=4 SENSITIVE Sensitive     Extended ESBL NEGATIVE Sensitive     * RARE ESCHERICHIA COLI   Pseudomonas aeruginosa - MIC*    CEFTAZIDIME 4 SENSITIVE Sensitive     CIPROFLOXACIN  <=0.25 SENSITIVE Sensitive     GENTAMICIN <=1 SENSITIVE Sensitive     IMIPENEM <=0.25 SENSITIVE Sensitive     PIP/TAZO 8 SENSITIVE Sensitive     CEFEPIME 2 SENSITIVE Sensitive     * RARE PSEUDOMONAS AERUGINOSA  MRSA PCR Screening     Status: None   Collection Time: 02/25/18  3:35 PM  Result Value Ref Range Status   MRSA by PCR NEGATIVE NEGATIVE Final    Comment:        The GeneXpert MRSA Assay (FDA approved for NASAL specimens only), is one component of a comprehensive MRSA colonization surveillance program. It is not intended to diagnose MRSA infection nor to guide or monitor treatment for MRSA infections. Performed at [facility], [address] [place], [place]  [phone]   MRSA PCR Screening     Status: None   Collection Time: 03/01/18  1:55 PM  Result Value Ref Range Status   MRSA by PCR NEGATIVE NEGATIVE Final    Comment:        The GeneXpert MRSA Assay (FDA approved for NASAL specimens only), is one component of a comprehensive MRSA colonization surveillance program. It is not intended to diagnose MRSA infection nor to guide or monitor treatment for MRSA infections. Performed at [facility], [address] [place], [place] [id number]    Events:   Studies: Ct Angio Chest Pe W Or Wo Contrast  Result Date: 03/01/2018 CLINICAL DATA:  Desaturation, chest tightness. EXAM: CT ANGIOGRAPHY CHEST WITH CONTRAST TECHNIQUE: Multidetector CT imaging of the chest was performed using the standard protocol during bolus administration of intravenous contrast. Multiplanar CT image reconstructions and MIPs were obtained to evaluate the vascular anatomy. CONTRAST:  75mL ISOVUE-370 IOPAMIDOL (ISOVUE-370) INJECTION 76% COMPARISON:  Chest x-ray from earlier same day. FINDINGS: Cardiovascular: There is no pulmonary embolism identified within the main, lobar or segmental pulmonary arteries bilaterally. No thoracic aortic aneurysm or evidence of aortic dissection. Mild  aortic atherosclerosis. Mild cardiomegaly. No significant pericardial effusion. Mediastinum/Nodes: No mass or enlarged lymph nodes seen within the mediastinum or perihilar regions. Esophagus appears normal. Trachea and central bronchi are unremarkable. Lungs/Pleura: Diffuse ground-glass opacities throughout both lungs, mid and upper lobe predominant. Denser consolidation within the superior segment of the RIGHT lower lobe, highly suspicious for pneumonia. Small RIGHT pleural effusion. No pneumothorax. Upper Abdomen: No acute findings within the upper abdomen. Musculoskeletal: No acute or suspicious osseous finding. RIGHT humeral head/neck fracture, incompletely imaged, presumably chronic. Mild degenerative change within the thoracic spine. Review of the MIP images confirms the above findings. IMPRESSION: 1. Diffuse ground-glass opacities throughout both lungs, mid and upper lung predominant. Favor pulmonary edema. Differential includes atypical pneumonias such as viral or fungal, interstitial pneumonias, edema related to volume overload/CHF, chronic interstitial diseases, hypersensitivity pneumonitis, and respiratory bronchiolitis. 2. Probable RIGHT lower lobe pneumonia (superior segment of the RIGHT lower lobe). 3. Small RIGHT pleural effusion. 4. No pulmonary embolism. 5. RIGHT humeral head/neck fracture, incompletely imaged, presumably chronic. No previous x-rays to assess acuity. 6. Mild aortic atherosclerosis. Aortic Atherosclerosis (ICD10-I70.0). These results will be called to the ordering clinician or representative by the Radiologist Assistant, and communication documented in the PACS or zVision Dashboard. Electronically Signed   By: Bary RichardStan  Maynard M.D.   On: 03/01/2018 09:13   Ct Abdomen Pelvis W Contrast  Result Date: 02/25/2018 CLINICAL DATA:  Acute onset of generalized abdominal pain and tenderness. Loss of 36 pounds over the past month. EXAM: CT ABDOMEN AND PELVIS WITH CONTRAST TECHNIQUE:  Multidetector CT imaging of the abdomen and pelvis was performed using the standard protocol following bolus administration of intravenous contrast. CONTRAST:  75mL OMNIPAQUE IOHEXOL 300 MG/ML  SOLN COMPARISON:  CT of the abdomen and pelvis from 12/08/2017 FINDINGS: Lower chest: Mild right basilar atelectasis or scarring is noted. Scattered coronary artery calcifications are seen. Hepatobiliary: The liver is grossly unremarkable. Trace free fluid is noted tracking about the liver. The gallbladder is diffusely distended. There is dilatation of the common bile duct to 1.0 cm in diameter, raising concern for distal obstruction. Pancreas: The pancreas is within normal limits. Spleen: The spleen is unremarkable in appearance. Adrenals/Urinary Tract: The adrenal glands are unremarkable in appearance. The kidneys are within normal limits. There is no evidence of hydronephrosis. No renal or ureteral stones  are identified. No perinephric stranding is seen. Stomach/Bowel: Scattered free air is noted within the abdomen and pelvis, raising concern for bowel perforation. Mild complex fluid is noted tracking about large and small bowel loops. The site of bowel perforation is not definitely characterized on CT. There is diffuse wall thickening along the sigmoid colon, concerning for an underlying infectious or inflammatory colitis. The scattered pattern of air adjacent to the proximal sigmoid colon may correspond to the site of perforation. Scattered diverticulosis is noted along the sigmoid colon. The small bowel is grossly unremarkable. Residual wall thickening is noted along the antrum of the stomach and proximal duodenum. Adjacent prominent nodes are again seen, and underlying mass is a concern. The appendix is unremarkable in appearance. Vascular/Lymphatic: Scattered calcification is seen along the abdominal aorta and its branches. The abdominal aorta is otherwise grossly unremarkable. The inferior vena cava is grossly  unremarkable. No retroperitoneal lymphadenopathy is seen. No pelvic sidewall lymphadenopathy is identified. Reproductive: The bladder is mildly distended and grossly unremarkable. The patient is status post hysterectomy. No suspicious adnexal masses are seen. Other: No additional soft tissue abnormalities are seen. Musculoskeletal: No acute osseous abnormalities are identified. The visualized musculature is unremarkable in appearance. IMPRESSION: 1. Bowel perforation, with scattered free air in the abdomen and pelvis. Mild complex fluid tracks about large and small bowel loops. The site of bowel perforation is not definitely characterized. It may be at the proximal sigmoid colon. 2. Diffuse wall thickening along the sigmoid colon is compatible with underlying infectious or inflammatory colitis. 3. Residual wall thickening along the antrum of the stomach and proximal duodenum. Adjacent prominent nodes again noted. Underlying mass is suspected. Given prior gastric ulceration, perforation in this region is also possibility. Endoscopy would be helpful for further evaluation, when and as deemed clinically appropriate. 4. Dilatation of the common bile duct to 1.0 cm in diameter, raising concern for distal obstruction. Diffuse gallbladder distention. Would correlate with LFTs. 5. Scattered diverticulosis along the sigmoid colon. Aortic Atherosclerosis (ICD10-I70.0). These results were called by telephone at the time of interpretation on 02/25/2018 at 6:09 am to Dr. York Cerise, who verbally acknowledged these results. Electronically Signed   By: Roanna Raider M.D.   On: 02/25/2018 06:15   Dg Chest Port 1 View  Result Date: 03/02/2018 CLINICAL DATA:  Respiratory failure. EXAM: PORTABLE CHEST 1 VIEW COMPARISON:  Radiograph of March 01, 2018. FINDINGS: The heart size and mediastinal contours are within normal limits. No pneumothorax or pleural effusion is noted. Right-sided PICC line is noted which is unchanged in position.  Stable diffuse reticular densities are noted throughout both lungs most consistent with pulmonary edema, although atypical infection cannot be excluded. Stable proximal right humeral fracture is noted. IMPRESSION: Stable diffuse reticular densities are noted throughout both lungs most consistent with pulmonary edema, although atypical infection cannot be excluded. Electronically Signed   By: Lupita Raider, M.D.   On: 03/02/2018 09:10   Dg Chest Port 1 View  Result Date: 03/01/2018 CLINICAL DATA:  Acute onset of respiratory distress. Patient in abdominal surgery. EXAM: PORTABLE CHEST 1 VIEW COMPARISON:  Chest radiograph performed 02/26/2018 FINDINGS: Diffuse bilateral airspace opacification is new from the prior study and may reflect flash pulmonary edema or possibly pneumonia. ARDS cannot be excluded. A small right pleural effusion is noted. No pneumothorax is seen The cardiomediastinal silhouette is normal in size. A displaced right humeral head and neck fracture is noted. The patient's right PICC is noted ending about the mid SVC. IMPRESSION:  1. Diffuse bilateral airspace opacification is new from the prior study and may reflect flash pulmonary edema or possibly pneumonia. ARDS cannot be excluded. Small right pleural effusion noted. 2. Displaced right humeral head and neck fracture noted. Electronically Signed   By: Roanna Raider M.D.   On: 03/01/2018 04:42   Dg Chest Port 1 View  Result Date: 02/26/2018 CLINICAL DATA:  67 year old female status post gastric tube placement EXAM: PORTABLE CHEST 1 VIEW COMPARISON:  CT scan of the abdomen and pelvis 02/25/2018 FINDINGS: A nasogastric tube is present and projects over the left upper quadrant in the expected position. Cardiac and mediastinal contours are within normal limits. Atherosclerotic calcifications present in the transverse aorta. Mild left retrocardiac airspace opacity favored to reflect atelectasis. No pleural effusion or pneumothorax. No acute  osseous abnormality. IMPRESSION: 1. Well-positioned gastrostomy tube. 2. Left retrocardiac airspace opacity may reflect atelectasis or infiltrate. Atelectasis is favored. 3.  Aortic Atherosclerosis (ICD10-170.0) Electronically Signed   By: Malachy Moan M.D.   On: 02/26/2018 14:15   [place] Ekg Site Rite  Result Date: 02/26/2018 If Site Rite image not attached, placement could not be confirmed due to current cardiac rhythm.   Consults: Treatment Team:  Leafy Ro, MD Shane Crutch, MD [facility], [facility], MD   Subjective:    Overnight Issues: remain on BiPAP overnight.  Objective:  Vital signs for last 24 hours: Temp:  [97.8 F (36.6 C)-98.3 F (36.8 C)] 98.3 F (36.8 C) (06/06 0800) Pulse Rate:  [93-104] 99 (06/06 0700) Resp:  [18-28] 25 (06/06 0700) BP: (97-132)/(39-79) 115/64 (06/06 0700) SpO2:  [89 %-100 %] 97 % (06/06 0700)  Hemodynamic parameters for last 24 hours:    Intake/Output from previous day: 06/05 0701 - 06/06 0700 In: 550 [IV Piggyback:550] Out: 1680 [Urine:1480; Drains:200]  Vent settings for last 24 hours:   Physical Exam:   Vital signs: Please see the above listed vital signs Patient is presently on BiPAP, states she feels comfortable right now HEENT: Limited oral exam, trachea is midline, no thyromegaly appreciated Cardiovascular: Regular rate and rhythm Pulmonary: Bilateral crackles with coarse rhonchi Abdominal: Soft exam positive bowel sounds Extremities: No clubbing cyanosis or edema appreciated   Assessment/Plan:   Hypoxemic respiratory failure. Pulmonary edema/volume overload. Patient diuresed 1.1 L yesterday, we'll continuediuretic therapy as blood pressure will allow, patient is on BiPAP, will wean to nasal cannula as tolerated. Agree with empiric treatment for pneumonia with vancomycin and Zosyn  Status post repair of perforated viscus  Anemia: No acute bleeding noted, most recent hemoglobin is 7.4  Hyponatremia.  resolved  Leukocytosis. Last white count was 9.  Critical care time 35 minutes   Keevin Panebianco 03/03/2018  *Care during the described time interval was provided by me and/or other providers on the critical care team.  I have reviewed this patient's available data, including medical history, events of note, physical examination and test results as part of my evaluation. Patient ID: ANNITA RATLIFF, female   DOB: 23-Aug-1951, 67 y.o.   MRN: [id number]

## 2018-03-03 NOTE — Progress Notes (Signed)
Pharmacy Antibiotic Note  Annette Miller is a 67 y.o. female admitted on 02/25/2018 with pneumonia. Patient underwent ex lap with appendectomy, Hartmann sigmoidectomy, and colostomy on 5/31 before being transferred to ICU. Patient was previously taking zosyn post-op and has developed pulmonary edema, atelectasis, and possible pnuemonia. Pharmacy has been consulted for meropenem and vancomycin dosing.  Plan: Continue meropenem IV 1 gram q8h. Continue vancomycin 750 mg q18h for goal trough of 15-20. Will monitor trough prior to am dose on 6/7.   Height: 5\' 2"  (157.5 cm) Weight: 110 lb 3.7 oz (50 kg) IBW/kg (Calculated) : 50.1  Temp (24hrs), Avg:98.1 F (36.7 C), Min:97.8 F (36.6 C), Max:98.3 F (36.8 C)  Recent Labs  Lab 02/27/18 0804 02/28/18 0512 03/01/18 0626 03/02/18 0507 03/03/18 0410  WBC 10.2 16.9* 18.9* 9.9 9.0  CREATININE 0.52 0.50 0.47 0.49 0.49    Estimated Creatinine Clearance: 54.6 mL/min (by C-G formula based on SCr of 0.49 mg/dL).    Allergies  Allergen Reactions  . Codeine Diarrhea and Nausea And Vomiting  . Prednisone Other (See Comments)    Reaction: violence    Antimicrobials this admission: Zosyn 5/31 >> 6/5 Vancomycin 6/4 >>  Meropenem 6/5 >>  Dose adjustments this admission: n/a  Microbiology results: 5/31 Abdomen fluid culture: rare e.coli (meropenem sensitive), rare p.aeruginosa (meropenem sensitive)  5/31 MRSA PCR: negative 6/4 MRSA PCA: negative  Thank you for allowing pharmacy to be a part of this patient's care.  Simpson,Michael L 03/03/2018 8:43 PM

## 2018-03-03 NOTE — Progress Notes (Signed)
6 Days Post-Op  Subjective: Patient feels better today off the BiPAP mask.  Wants to eat but has no ostomy output yet  Objective: Vital signs in last 24 hours: Temp:  [97.8 F (36.6 C)-98.3 F (36.8 C)] 98.3 F (36.8 C) (06/06 0800) Pulse Rate:  [93-104] 102 (06/06 1000) Resp:  [18-28] 27 (06/06 1000) BP: (97-128)/(39-79) 124/66 (06/06 1000) SpO2:  [89 %-100 %] 93 % (06/06 1000) Last BM Date: 02/28/18  Intake/Output from previous day: 06/05 0701 - 06/06 0700 In: 650 [IV Piggyback:650] Out: 1680 [Urine:1480; Drains:200] Intake/Output this shift: Total I/O In: 10 [I.V.:10] Out: 125 [Urine:125]  Physical exam:  Vital signs reviewed abdomen is soft nontender ostomy is pink but no function yet drains is serous only.  Lab Results: CBC  Recent Labs    03/02/18 0507 03/03/18 0410  WBC 9.9 9.0  HGB 7.6* 7.4*  HCT 22.0* 21.2*  PLT 505* 526*   BMET Recent Labs    03/02/18 0507 03/03/18 0410  NA 136 136  K 3.8 3.4*  CL 99* 97*  CO2 30 27  GLUCOSE 93 82  BUN 6 10  CREATININE 0.49 0.49  CALCIUM 7.1* 7.3*   PT/INR No results for input(s): LABPROT, INR in the last 72 hours. ABG Recent Labs    03/01/18 1639  PHART 7.44  HCO3 30.6*    Studies/Results: Dg Chest Port 1 View  Result Date: 03/02/2018 CLINICAL DATA:  Respiratory failure. EXAM: PORTABLE CHEST 1 VIEW COMPARISON:  Radiograph of March 01, 2018. FINDINGS: The heart size and mediastinal contours are within normal limits. No pneumothorax or pleural effusion is noted. Right-sided PICC line is noted which is unchanged in position. Stable diffuse reticular densities are noted throughout both lungs most consistent with pulmonary edema, although atypical infection cannot be excluded. Stable proximal right humeral fracture is noted. IMPRESSION: Stable diffuse reticular densities are noted throughout both lungs most consistent with pulmonary edema, although atypical infection cannot be excluded. Electronically Signed   By:  Lupita Raider, M.D.   On: 03/02/2018 09:10    Anti-infectives: Anti-infectives (From admission, onward)   Start     Dose/Rate Route Frequency Ordered Stop   03/02/18 1500  meropenem (MERREM) 1 g in sodium chloride 0.9 % 100 mL IVPB  Status:  Discontinued     1 g 200 mL/hr over 30 Minutes Intravenous Every 12 hours 03/02/18 1149 03/02/18 1151   03/02/18 1400  meropenem (MERREM) 1 g in sodium chloride 0.9 % 100 mL IVPB     1 g 200 mL/hr over 30 Minutes Intravenous Every 8 hours 03/02/18 1151     03/02/18 0400  vancomycin (VANCOCIN) IVPB 750 mg/150 ml premix     750 mg 150 mL/hr over 60 Minutes Intravenous Every 18 hours 03/01/18 1014     03/01/18 1015  vancomycin (VANCOCIN) IVPB 1000 mg/200 mL premix     1,000 mg 200 mL/hr over 60 Minutes Intravenous  Once 03/01/18 1012 03/01/18 1206   02/25/18 1600  piperacillin-tazobactam (ZOSYN) IVPB 3.375 g  Status:  Discontinued     3.375 g 12.5 mL/hr over 240 Minutes Intravenous Every 8 hours 02/25/18 1326 03/02/18 1037   02/25/18 0645  vancomycin (VANCOCIN) IVPB 1000 mg/200 mL premix     1,000 mg 200 mL/hr over 60 Minutes Intravenous  Once 02/25/18 0639 02/25/18 0814   02/25/18 0630  piperacillin-tazobactam (ZOSYN) IVPB 3.375 g     3.375 g 100 mL/hr over 30 Minutes Intravenous  Once 02/25/18 0619 02/25/18 0715  02/25/18 0630  vancomycin (VANCOCIN) injection 1 g  Status:  Discontinued     1 g Intravenous  Once 02/25/18 [id number] 02/25/18 [id number]      Assessment/Plan: s/p Procedure(s): EXPLORATORY LAPAROTOMY, PARTMAN'S PROCEDURE, APPENDECTOMY, COLOSTOMY   Status post Hartman's procedure.  We will put back on clear liquids and give clear liquid supplements as discussed with nutrition.  Possibly back to the floor once her pulmonary status improves.  Lattie Hawichard E Deejay Koppelman, MD, FACS  03/03/2018

## 2018-03-03 NOTE — Progress Notes (Signed)
Nutrition Follow-up  DOCUMENTATION CODES:   Not applicable  INTERVENTION:  Provide Boost Breeze po TID, each supplement provides 250 kcal and 9 grams of protein.  Patient has been NPO or on CLD for 6 days now. Will need to consider addition of nutrition support by 6/10 if patient's diet is unable to be advanced by then.  NUTRITION DIAGNOSIS:   Increased nutrient needs related to post-op healing as evidenced by estimated needs.  New nutrition diagnosis.  GOAL:   Patient will meet greater than or equal to 90% of their needs  Progressing.  MONITOR:   PO intake, Supplement acceptance, Diet advancement, Labs, Weight trends, Skin, I & O's  REASON FOR ASSESSMENT:   Malnutrition Screening Tool    ASSESSMENT:   67 year old female with PMHx of depression, HLD, OP, hypothyroidism, goiter, anemia, arthritis who presented with abdominal pain, N/V found to have perforated diverticulitis s/p exploratory laparotomy, Hartmann sigmoidectomy and colostomy, and appendectomy on 5/31.   -Patient transferred to floor on 6/1. -NGT was removed on 6/2 and diet was advanced to CLD. -Patient transferred to ICU on 6/4 with worsening respiratory status requiring BiPAP. -Patient has been on BiPAP, so even though she was on a CLD, she was not able to try eating her meals as she was not able to previously tolerate time off of BiPAP.  Met with patient at bedside. She was on HFNC. Patient is excited to be able to try some clear liquids. She reports she did not have any of her clear liquid breakfast because the broth was "too salty" but reports she is going to try again at lunch. She is reporting abdominal pain and nausea. She is amenable to drinking Boost Breeze to help meet her calorie/protein needs.  Medications reviewed and include: Lasix 20 mg daily IV, levothyroxine, meropenem, vancomycin.  Labs reviewed: CBG 72-82 past 24 hrs, Potassium 3.4, Chloride 97.  I/O: 1480 mL UOP yesterday (1.2 mL/kg/hr);  60 mL output from right JP drain; 140 mL output from left JP drain  No weight since 6/4 to trend.  Discussed with RN and on rounds. As patient is now off BiPAP and on HFNC she will be able to start eating her CLD today. Also discussed with Surgeon. Okay to order patient for clear ONS.  Diet Order:   Diet Order           Diet clear liquid Room service appropriate? Yes; Fluid consistency: Thin  Diet effective now          EDUCATION NEEDS:   No education needs have been identified at this time  Skin:  Skin Assessment: Skin Integrity Issues: Skin Integrity Issues:: Incisions Incisions: closed incision to abdomen  Last BM:  02/26/2018 - small type 7 per colostomy (per Surgeon note that day the output was a clear liquid so not a true BM); no further colostomy output has been documented  Height:   Ht Readings from Last 1 Encounters:  03/01/18 _0  (1.575 m)    Weight:   Wt Readings from Last 1 Encounters:  03/01/18 110 lb 3.7 oz (50 kg)    Ideal Body Weight:  50 kg  BMI:  Body mass index is 20.16 kg/m.  Estimated Nutritional Needs:   Kcal:  1500-1750 kcal (30-35 kcal/kg)  Protein:  75-85 grams (1.5-1.7 grams/kg)  Fluid:  1.5-1.75 L/day (30-35 mL/kg)  Willey Blade, MS, RD, LDN Office: [phone] Pager: [phone] After Hours/Weekend Pager: [phone]

## 2018-03-04 ENCOUNTER — Inpatient Hospital Stay: Payer: Medicare HMO

## 2018-03-04 DIAGNOSIS — J9601 Acute respiratory failure with hypoxia: Secondary | ICD-10-CM

## 2018-03-04 LAB — VANCOMYCIN, TROUGH: Vancomycin Tr: 7 ug/mL — ABNORMAL LOW (ref 15–20)

## 2018-03-04 LAB — GLUCOSE, CAPILLARY
GLUCOSE-CAPILLARY: 90 mg/dL (ref 65–99)
Glucose-Capillary: 91 mg/dL (ref 65–99)
Glucose-Capillary: 91 mg/dL (ref 65–99)
Glucose-Capillary: 90 mg/dL (ref 65–99)

## 2018-03-04 MED ORDER — BISACODYL 10 MG RE SUPP
10.0000 mg | Freq: Once | RECTAL | Status: AC
  Administered 2018-03-04: 10 mg via RECTAL
  Filled 2018-03-04: qty 1

## 2018-03-04 NOTE — Progress Notes (Signed)
Pharmacy Antibiotic Note  Annette Miller is a 67 y.o. female admitted on 02/25/2018 with pneumonia. Patient underwent ex lap with appendectomy, Hartmann sigmoidectomy, and colostomy on 5/31 before being transferred to ICU. Patient was previously taking zosyn post-op and has developed pulmonary edema, atelectasis, and possible pnuemonia. Pharmacy has been consulted for meropenem and vancomycin dosing. Wound culture positive for e. coli and pseudomonas, negative for MRSA; will de-escalate antibiotic coverage at this time to only meropenem.  Plan: Continue meropenem IV 1 gram q8h. Discontinue vancomycin.   Height: 5\' 2"  (157.5 cm) Weight: 110 lb 3.7 oz (50 kg) IBW/kg (Calculated) : 50.1  Temp (24hrs), Avg:99 F (37.2 C), Min:98.5 F (36.9 C), Max:99.8 F (37.7 C)  Recent Labs  Lab 02/27/18 0804 02/28/18 0512 03/01/18 0626 03/02/18 0507 03/03/18 0410 03/04/18 0924  WBC 10.2 16.9* 18.9* 9.9 9.0  --   CREATININE 0.52 0.50 0.47 0.49 0.49  --   VANCOTROUGH  --   --   --   --   --  7*    Estimated Creatinine Clearance: 54.6 mL/min (by C-G formula based on SCr of 0.49 mg/dL).    Allergies  Allergen Reactions  . Codeine Diarrhea and Nausea And Vomiting  . Prednisone Other (See Comments)    Reaction: violence    Antimicrobials this admission: Zosyn 5/31 >> 6/5 Vancomycin 6/4 >> 6/7 Meropenem 6/5 >>  Dose adjustments this admission: n/a  Microbiology results: 5/31 Abdomen fluid culture: rare e.coli (meropenem sensitive), rare p.aeruginosa (meropenem sensitive)  5/31 MRSA PCR: negative 6/4 MRSA PCA: negative  Thank you for allowing pharmacy to be a part of this patient's care.  Annette NimrodCorinne J Dametri Miller 03/04/2018 10:44 AM

## 2018-03-04 NOTE — Progress Notes (Signed)
Follow up - Critical Care Medicine Note  Patient Details:    Annette Miller is an 67 y.o. female.with a history of COPD, tobacco abuse, duodenal ulcer gastritis diverticulosis, depression, goiter, status post perforated viscus, taken to the OR on same day, underwent ex lap with appendectomy, Hartmann sigmoidectomy and colostomy.  Patient was subsequently transferred to the ICU. Patient had worsening FiO2 requirements, was transferred to the intensive care unit, felt to have both pulmonary edema and atelectasis  Lines, Airways, Drains: PICC Triple Lumen 02/26/18 PICC Right Brachial 36 cm 4 cm (Active)  Indication for Insertion or Continuance of Line Administration of hyperosmolar/irritating solutions (i.e. TPN, Vancomycin, etc.) 03/01/2018  7:37 PM  Site Assessment Intact;Dry;Bleeding 03/01/2018  7:37 PM  Lumen #1 Status Infusing;Flushed 03/01/2018  7:37 PM  Lumen #2 Status Saline locked;Flushed 03/01/2018  7:37 PM  Lumen #3 Status Flushed;Saline locked 03/01/2018  7:37 PM  Dressing Type Transparent;Occlusive;Gauze 03/01/2018  7:37 PM  Dressing Status Clean;Dry;Intact;Antimicrobial disc in place 03/01/2018  7:37 PM  Line Care Connections checked and tightened 03/01/2018  7:37 PM  Dressing Intervention Other (Comment) 03/01/2018  7:37 PM  Dressing Change Due 03/05/18 03/01/2018  7:37 PM     Closed System Drain 1 Right Abdomen Bulb (JP) 19 Fr. (Active)  Site Description Unremarkable 03/02/2018  6:00 AM  Dressing Status Clean;Dry;Intact 03/02/2018  6:00 AM  Drainage Appearance Pink tinged 03/01/2018  8:00 PM  Status To suction (Charged) 03/01/2018  8:00 PM  Intake (mL) 80 ml 03/01/2018  2:15 AM  Output (mL) 45 mL 03/02/2018  6:10 AM     Closed System Drain 2 Left Abdomen Bulb (JP) 19 Fr. (Active)  Site Description Unable to view 03/01/2018  8:00 PM  Dressing Status Clean;Dry;Intact 03/01/2018  8:00 PM  Drainage Appearance Pink tinged 03/01/2018  8:00 PM  Status To suction (Charged) 03/01/2018  8:00 PM  Intake (mL) 30 ml  03/01/2018  2:15 AM  Output (mL) 30 mL 03/02/2018  6:10 AM     Colostomy LLQ (Active)  Ostomy Pouch 2 piece;Intact 03/01/2018  8:00 PM  Stoma Assessment Pink;Red 03/01/2018  8:00 PM  Peristomal Assessment Intact 03/01/2018  8:00 PM  Treatment Other (Comment) 03/01/2018  8:00 PM  Output (mL) 0 mL 03/02/2018  6:10 AM     Urethral Catheter Gordy Clement RN 16 Fr. (Active)  Indication for Insertion or Continuance of Catheter Aggressive IV diuresis 03/01/2018  8:00 PM  Site Assessment Clean;Intact;Dry 03/01/2018  8:00 PM  Catheter Maintenance Bag below level of bladder;Catheter secured;Drainage bag/tubing not touching floor;Seal intact;No dependent loops;Insertion date on drainage bag 03/01/2018  8:00 PM  Collection Container Dedicated Suction Canister 03/01/2018  8:00 PM  Securement Method Securing device (Describe) 03/01/2018  8:00 PM  Urinary Catheter Interventions Unclamped 03/01/2018  8:00 PM  Output (mL) 100 mL 03/02/2018  8:00 AM    Anti-infectives:  Anti-infectives (From admission, onward)   Start     Dose/Rate Route Frequency Ordered Stop   03/02/18 1500  meropenem (MERREM) 1 g in sodium chloride 0.9 % 100 mL IVPB  Status:  Discontinued     1 g 200 mL/hr over 30 Minutes Intravenous Every 12 hours 03/02/18 1149 03/02/18 1151   03/02/18 1400  meropenem (MERREM) 1 g in sodium chloride 0.9 % 100 mL IVPB     1 g 200 mL/hr over 30 Minutes Intravenous Every 8 hours 03/02/18 1151     03/02/18 0400  vancomycin (VANCOCIN) IVPB 750 mg/150 ml premix     750 mg  150 mL/hr over 60 Minutes Intravenous Every 18 hours 03/01/18 1014     03/01/18 1015  vancomycin (VANCOCIN) IVPB 1000 mg/200 mL premix     1,000 mg 200 mL/hr over 60 Minutes Intravenous  Once 03/01/18 1012 03/01/18 1206   02/25/18 1600  piperacillin-tazobactam (ZOSYN) IVPB 3.375 g  Status:  Discontinued     3.375 g 12.5 mL/hr over 240 Minutes Intravenous Every 8 hours 02/25/18 1326 03/02/18 1037   02/25/18 0645  vancomycin (VANCOCIN) IVPB 1000 mg/200 mL  premix     1,000 mg 200 mL/hr over 60 Minutes Intravenous  Once 02/25/18 0639 02/25/18 0814   02/25/18 0630  piperacillin-tazobactam (ZOSYN) IVPB 3.375 g     3.375 g 100 mL/hr over 30 Minutes Intravenous  Once 02/25/18 0619 02/25/18 0715   02/25/18 0630  vancomycin (VANCOCIN) injection 1 g  Status:  Discontinued     1 g Intravenous  Once 02/25/18 1610 02/25/18 9604      Microbiology: Results for orders placed or performed during the hospital encounter of 02/25/18  Aerobic/Anaerobic Culture (surgical/deep wound)     Status: None   Collection Time: 02/25/18  9:50 AM  Result Value Ref Range Status   Specimen Description   Final    ABDOMEN FLUID Performed at [facility], [address] [place], [place] [address]    Special Requests   Final    NONE Performed at [facility], [address] [place], [place] [address]    Gram Stain   Final    ABUNDANT WBC PRESENT, PREDOMINANTLY PMN RARE GRAM POSITIVE COCCI    Culture   Final    RARE ESCHERICHIA COLI RARE PSEUDOMONAS AERUGINOSA NO ANAEROBES ISOLATED Performed at [facility], [address] [place], [place] [address]    Report Status 03/02/2018 FINAL  Final   Organism ID, Bacteria ESCHERICHIA COLI  Final   Organism ID, Bacteria PSEUDOMONAS AERUGINOSA  Final      Susceptibility   Escherichia coli - MIC*    AMPICILLIN <=2 SENSITIVE Sensitive     CEFAZOLIN <=4 SENSITIVE Sensitive     CEFEPIME <=1 SENSITIVE Sensitive     CEFTAZIDIME <=1 SENSITIVE Sensitive     CEFTRIAXONE <=1 SENSITIVE Sensitive     CIPROFLOXACIN <=0.25 SENSITIVE Sensitive     GENTAMICIN <=1 SENSITIVE Sensitive     IMIPENEM <=0.25 SENSITIVE Sensitive     TRIMETH/SULFA <=20 SENSITIVE Sensitive     AMPICILLIN/SULBACTAM <=2 SENSITIVE Sensitive     PIP/TAZO <=4 SENSITIVE Sensitive     Extended ESBL NEGATIVE Sensitive     * RARE ESCHERICHIA COLI   Pseudomonas aeruginosa - MIC*    CEFTAZIDIME 4 SENSITIVE Sensitive     CIPROFLOXACIN  <=0.25 SENSITIVE Sensitive     GENTAMICIN <=1 SENSITIVE Sensitive     IMIPENEM <=0.25 SENSITIVE Sensitive     PIP/TAZO 8 SENSITIVE Sensitive     CEFEPIME 2 SENSITIVE Sensitive     * RARE PSEUDOMONAS AERUGINOSA  MRSA PCR Screening     Status: None   Collection Time: 02/25/18  3:35 PM  Result Value Ref Range Status   MRSA by PCR NEGATIVE NEGATIVE Final    Comment:        The GeneXpert MRSA Assay (FDA approved for NASAL specimens only), is one component of a comprehensive MRSA colonization surveillance program. It is not intended to diagnose MRSA infection nor to guide or monitor treatment for MRSA infections. Performed at [facility], [address] [place], [place]  [phone]   MRSA PCR Screening     Status: None   Collection Time: 03/01/18  1:55 PM  Result Value Ref Range Status   MRSA by PCR NEGATIVE NEGATIVE Final    Comment:        The GeneXpert MRSA Assay (FDA approved for NASAL specimens only), is one component of a comprehensive MRSA colonization surveillance program. It is not intended to diagnose MRSA infection nor to guide or monitor treatment for MRSA infections. Performed at [facility], [address] [place], [place] [id number]    Events:   Studies: Ct Angio Chest Pe W Or Wo Contrast  Result Date: 03/01/2018 CLINICAL DATA:  Desaturation, chest tightness. EXAM: CT ANGIOGRAPHY CHEST WITH CONTRAST TECHNIQUE: Multidetector CT imaging of the chest was performed using the standard protocol during bolus administration of intravenous contrast. Multiplanar CT image reconstructions and MIPs were obtained to evaluate the vascular anatomy. CONTRAST:  75mL ISOVUE-370 IOPAMIDOL (ISOVUE-370) INJECTION 76% COMPARISON:  Chest x-ray from earlier same day. FINDINGS: Cardiovascular: There is no pulmonary embolism identified within the main, lobar or segmental pulmonary arteries bilaterally. No thoracic aortic aneurysm or evidence of aortic dissection. Mild  aortic atherosclerosis. Mild cardiomegaly. No significant pericardial effusion. Mediastinum/Nodes: No mass or enlarged lymph nodes seen within the mediastinum or perihilar regions. Esophagus appears normal. Trachea and central bronchi are unremarkable. Lungs/Pleura: Diffuse ground-glass opacities throughout both lungs, mid and upper lobe predominant. Denser consolidation within the superior segment of the RIGHT lower lobe, highly suspicious for pneumonia. Small RIGHT pleural effusion. No pneumothorax. Upper Abdomen: No acute findings within the upper abdomen. Musculoskeletal: No acute or suspicious osseous finding. RIGHT humeral head/neck fracture, incompletely imaged, presumably chronic. Mild degenerative change within the thoracic spine. Review of the MIP images confirms the above findings. IMPRESSION: 1. Diffuse ground-glass opacities throughout both lungs, mid and upper lung predominant. Favor pulmonary edema. Differential includes atypical pneumonias such as viral or fungal, interstitial pneumonias, edema related to volume overload/CHF, chronic interstitial diseases, hypersensitivity pneumonitis, and respiratory bronchiolitis. 2. Probable RIGHT lower lobe pneumonia (superior segment of the RIGHT lower lobe). 3. Small RIGHT pleural effusion. 4. No pulmonary embolism. 5. RIGHT humeral head/neck fracture, incompletely imaged, presumably chronic. No previous x-rays to assess acuity. 6. Mild aortic atherosclerosis. Aortic Atherosclerosis (ICD10-I70.0). These results will be called to the ordering clinician or representative by the Radiologist Assistant, and communication documented in the PACS or zVision Dashboard. Electronically Signed   By: Bary RichardStan  Maynard M.D.   On: 03/01/2018 09:13   Ct Abdomen Pelvis W Contrast  Result Date: 02/25/2018 CLINICAL DATA:  Acute onset of generalized abdominal pain and tenderness. Loss of 36 pounds over the past month. EXAM: CT ABDOMEN AND PELVIS WITH CONTRAST TECHNIQUE:  Multidetector CT imaging of the abdomen and pelvis was performed using the standard protocol following bolus administration of intravenous contrast. CONTRAST:  75mL OMNIPAQUE IOHEXOL 300 MG/ML  SOLN COMPARISON:  CT of the abdomen and pelvis from 12/08/2017 FINDINGS: Lower chest: Mild right basilar atelectasis or scarring is noted. Scattered coronary artery calcifications are seen. Hepatobiliary: The liver is grossly unremarkable. Trace free fluid is noted tracking about the liver. The gallbladder is diffusely distended. There is dilatation of the common bile duct to 1.0 cm in diameter, raising concern for distal obstruction. Pancreas: The pancreas is within normal limits. Spleen: The spleen is unremarkable in appearance. Adrenals/Urinary Tract: The adrenal glands are unremarkable in appearance. The kidneys are within normal limits. There is no evidence of hydronephrosis. No renal or ureteral stones  are identified. No perinephric stranding is seen. Stomach/Bowel: Scattered free air is noted within the abdomen and pelvis, raising concern for bowel perforation. Mild complex fluid is noted tracking about large and small bowel loops. The site of bowel perforation is not definitely characterized on CT. There is diffuse wall thickening along the sigmoid colon, concerning for an underlying infectious or inflammatory colitis. The scattered pattern of air adjacent to the proximal sigmoid colon may correspond to the site of perforation. Scattered diverticulosis is noted along the sigmoid colon. The small bowel is grossly unremarkable. Residual wall thickening is noted along the antrum of the stomach and proximal duodenum. Adjacent prominent nodes are again seen, and underlying mass is a concern. The appendix is unremarkable in appearance. Vascular/Lymphatic: Scattered calcification is seen along the abdominal aorta and its branches. The abdominal aorta is otherwise grossly unremarkable. The inferior vena cava is grossly  unremarkable. No retroperitoneal lymphadenopathy is seen. No pelvic sidewall lymphadenopathy is identified. Reproductive: The bladder is mildly distended and grossly unremarkable. The patient is status post hysterectomy. No suspicious adnexal masses are seen. Other: No additional soft tissue abnormalities are seen. Musculoskeletal: No acute osseous abnormalities are identified. The visualized musculature is unremarkable in appearance. IMPRESSION: 1. Bowel perforation, with scattered free air in the abdomen and pelvis. Mild complex fluid tracks about large and small bowel loops. The site of bowel perforation is not definitely characterized. It may be at the proximal sigmoid colon. 2. Diffuse wall thickening along the sigmoid colon is compatible with underlying infectious or inflammatory colitis. 3. Residual wall thickening along the antrum of the stomach and proximal duodenum. Adjacent prominent nodes again noted. Underlying mass is suspected. Given prior gastric ulceration, perforation in this region is also possibility. Endoscopy would be helpful for further evaluation, when and as deemed clinically appropriate. 4. Dilatation of the common bile duct to 1.0 cm in diameter, raising concern for distal obstruction. Diffuse gallbladder distention. Would correlate with LFTs. 5. Scattered diverticulosis along the sigmoid colon. Aortic Atherosclerosis (ICD10-I70.0). These results were called by telephone at the time of interpretation on 02/25/2018 at 6:09 am to Dr. York Cerise, who verbally acknowledged these results. Electronically Signed   By: Roanna Raider M.D.   On: 02/25/2018 06:15   Dg Chest Port 1 View  Result Date: 03/02/2018 CLINICAL DATA:  Respiratory failure. EXAM: PORTABLE CHEST 1 VIEW COMPARISON:  Radiograph of March 01, 2018. FINDINGS: The heart size and mediastinal contours are within normal limits. No pneumothorax or pleural effusion is noted. Right-sided PICC line is noted which is unchanged in position.  Stable diffuse reticular densities are noted throughout both lungs most consistent with pulmonary edema, although atypical infection cannot be excluded. Stable proximal right humeral fracture is noted. IMPRESSION: Stable diffuse reticular densities are noted throughout both lungs most consistent with pulmonary edema, although atypical infection cannot be excluded. Electronically Signed   By: Lupita Raider, M.D.   On: 03/02/2018 09:10   Dg Chest Port 1 View  Result Date: 03/01/2018 CLINICAL DATA:  Acute onset of respiratory distress. Patient in abdominal surgery. EXAM: PORTABLE CHEST 1 VIEW COMPARISON:  Chest radiograph performed 02/26/2018 FINDINGS: Diffuse bilateral airspace opacification is new from the prior study and may reflect flash pulmonary edema or possibly pneumonia. ARDS cannot be excluded. A small right pleural effusion is noted. No pneumothorax is seen The cardiomediastinal silhouette is normal in size. A displaced right humeral head and neck fracture is noted. The patient's right PICC is noted ending about the mid SVC. IMPRESSION:  1. Diffuse bilateral airspace opacification is new from the prior study and may reflect flash pulmonary edema or possibly pneumonia. ARDS cannot be excluded. Small right pleural effusion noted. 2. Displaced right humeral head and neck fracture noted. Electronically Signed   By: Roanna RaiderJeffery  Chang M.D.   On: 03/01/2018 04:42   Dg Chest Port 1 View  Result Date: 02/26/2018 CLINICAL DATA:  67 year old female status post gastric tube placement EXAM: PORTABLE CHEST 1 VIEW COMPARISON:  CT scan of the abdomen and pelvis 02/25/2018 FINDINGS: A nasogastric tube is present and projects over the left upper quadrant in the expected position. Cardiac and mediastinal contours are within normal limits. Atherosclerotic calcifications present in the transverse aorta. Mild left retrocardiac airspace opacity favored to reflect atelectasis. No pleural effusion or pneumothorax. No acute  osseous abnormality. IMPRESSION: 1. Well-positioned gastrostomy tube. 2. Left retrocardiac airspace opacity may reflect atelectasis or infiltrate. Atelectasis is favored. 3.  Aortic Atherosclerosis (ICD10-170.0) Electronically Signed   By: Malachy MoanHeath  McCullough M.D.   On: 02/26/2018 14:15   [place] Ekg Site Rite  Result Date: 02/26/2018 If Site Rite image not attached, placement could not be confirmed due to current cardiac rhythm.   Consults: Treatment Team:  Leafy RoPabon, Diego F, MD Shane Crutchamachandran, Pradeep, MD [facility], [facility], MD   Subjective:    Overnight Issues: doing well, weaned off BiPAP, presently on heated high flow  Objective:  Vital signs for last 24 hours: Temp:  [98.5 F (36.9 C)-98.6 F (37 C)] 98.6 F (37 C) (06/07 0200) Pulse Rate:  [89-115] 91 (06/07 0700) Resp:  [14-29] 17 (06/07 0700) BP: (93-129)/(50-92) 119/70 (06/07 0700) SpO2:  [89 %-100 %] 100 % (06/07 0700) FiO2 (%):  [33 %-46 %] 35 % (06/07 0723)  Hemodynamic parameters for last 24 hours:    Intake/Output from previous day: 06/06 0701 - 06/07 0700 In: 500 [P.O.:240; I.V.:10; IV Piggyback:250] Out: 1065 [Urine:875; Drains:190]  Vent settings for last 24 hours: FiO2 (%):  [33 %-46 %] 35 % Physical Exam:   Vital signs: Please see the above listed vital signs On heated high flow,she feels comfortable right now HEENT: Limited oral exam, trachea is midline, no thyromegaly appreciated Cardiovascular: Regular rate and rhythm Pulmonary: Bilateral crackles with coarse rhonchi Abdominal: Soft exam positive bowel sounds Extremities: No clubbing cyanosis or edema appreciated   Assessment/Plan:   Hypoxemic respiratory failure. Pulmonary edema/volume overload. Patient diuresed .5 L yesterday, we'll continuediuretic therapy as blood pressure will allow.   Empiric treatment for pneumonia with vancomycin and Merem.wound culture positive for Escherichia coli and Pseudomonas, will de-escalate antibiotic coverage   Status  post repair of perforated viscus  Anemia: No acute bleeding noted, most recent hemoglobin is 7.4  Leukocytosis. Last white count was 9.  Doing well stable for floor transfer   Kijana Cromie 03/04/2018  *Care during the described time interval was provided by me and/or other providers on the critical care team.  I have reviewed this patient's available data, including medical history, events of note, physical examination and test results as part of my evaluation. Patient ID: Annette Miller, female   DOB: 05-24-1951, 67 y.o.   MRN: [id number]

## 2018-03-04 NOTE — Progress Notes (Signed)
Telephone report called to Ree KidaJack, Charity fundraiserN.  Patient to be moved to room 215.

## 2018-03-04 NOTE — Progress Notes (Signed)
Advanced care plan.  Purpose of the Encounter: CODE STATUS  Parties in Attendance: Patient herself  Patient's Decision Capacity: Intact  Subjective/Patient's story: Annette Miller  is a 67 y.o. female with a known history of COPD, allergy, depression, goiter, hyperlipidemia, hypothyroidism who was admitted on 5/31 with peritonitis and perforated sigmoid colon.  Patient underwent laparotomy, Hartman's procedure, appendectomy and colostomy.  Patient required transfer to the ICU.     Objective/Medical story I discussed with the patient regarding her wishes regarding CODE STATUS.  Wishes to be intubated and perform CPR   Goals of care determination:  She states that she would like to be a full code   CODE STATUS: Full code   Time spent discussing advanced care planning: 16 minutes

## 2018-03-04 NOTE — Progress Notes (Signed)
[facility] [place] [facility]                                                                                                                                                                                  Patient Demographics   Annette Miller, is a 67 y.o. female, DOB - Jan 21, 1951, ONG:[id number]  Admit date - 02/25/2018   Admitting Physician Leafy Ro, MD  Outpatient Primary MD for the patient is Steele Sizer, MD   LOS - 7  Subjective: Patient doing much better oxygen is being weaned down  Review of Systems:   CONSTITUTIONAL: No documented fever. No fatigue, weakness. No weight gain, no weight loss.  EYES: No blurry or double vision.  ENT: No tinnitus. No postnasal drip. No redness of the oropharynx.  RESPIRATORY: No cough, no wheeze, no hemoptysis.  Positive dyspnea.  CARDIOVASCULAR: No chest pain. No orthopnea. No palpitations. No syncope.  GASTROINTESTINAL: No nausea, no vomiting or diarrhea.  Positive abdominal pain. No melena or hematochezia.  GENITOURINARY: No dysuria or hematuria.  ENDOCRINE: No polyuria or nocturia. No heat or cold intolerance.  HEMATOLOGY: No anemia. No bruising. No bleeding.  INTEGUMENTARY: No rashes. No lesions.  MUSCULOSKELETAL: No arthritis. No swelling. No gout.  NEUROLOGIC: No numbness, tingling, or ataxia. No seizure-type activity.  PSYCHIATRIC: No anxiety. No insomnia. No ADD.    Vitals:   Vitals:   03/04/18 0600 03/04/18 0700 03/04/18 0800 03/04/18 1120  BP: 124/66 119/70 113/67 133/73  Pulse: 96 91 96 97  Resp: 14 17 (!) 23   Temp:   99.8 F (37.7 C) 98.9 F (37.2 C)  TempSrc:    Oral  SpO2: 100% 100% 100% 94%  Weight:      Height:        Wt Readings from Last 3 Encounters:  03/01/18 50 kg (110 lb 3.7 oz)  01/14/18 48.5 kg (107 lb)  01/13/18 48.5 kg (107 lb)     Intake/Output Summary (Last 24 hours) at 03/04/2018 1233 Last data filed at 03/04/2018 0955 Gross per 24 hour  Intake 970 ml  Output 940  ml  Net 30 ml    Physical Exam:   GENERAL: Pleasant-appearing in no apparent distress.  HEAD, EYES, EARS, NOSE AND THROAT: Atraumatic, normocephalic. Extraocular muscles are intact. Pupils equal and reactive to light. Sclerae anicteric. No conjunctival injection. No oro-pharyngeal erythema.  NECK: Supple. There is no jugular venous distention. No bruits, no lymphadenopathy, no thyromegaly.  HEART: Regular rate and rhythm,. No murmurs, no rubs, no clicks.  LUNGS: Rhonchus breath sounds bilaterally ABDOMEN: Soft, postop EXTREMITIES: No evidence of any cyanosis, clubbing, or peripheral edema.  +2 pedal and  radial pulses bilaterally.  NEUROLOGIC: The patient is alert, awake, and oriented x3 with no focal motor or sensory deficits appreciated bilaterally.  SKIN: Moist and warm with no rashes appreciated.  Psych: Not anxious, depressed LN: No inguinal LN enlargement    Antibiotics   Anti-infectives (From admission, onward)   Start     Dose/Rate Route Frequency Ordered Stop   03/02/18 1500  meropenem (MERREM) 1 g in sodium chloride 0.9 % 100 mL IVPB  Status:  Discontinued     1 g 200 mL/hr over 30 Minutes Intravenous Every 12 hours 03/02/18 1149 03/02/18 1151   03/02/18 1400  meropenem (MERREM) 1 g in sodium chloride 0.9 % 100 mL IVPB     1 g 200 mL/hr over 30 Minutes Intravenous Every 8 hours 03/02/18 1151     03/02/18 0400  vancomycin (VANCOCIN) IVPB 750 mg/150 ml premix  Status:  Discontinued     750 mg 150 mL/hr over 60 Minutes Intravenous Every 18 hours 03/01/18 1014 03/04/18 1028   03/01/18 1015  vancomycin (VANCOCIN) IVPB 1000 mg/200 mL premix     1,000 mg 200 mL/hr over 60 Minutes Intravenous  Once 03/01/18 1012 03/01/18 1206   02/25/18 1600  piperacillin-tazobactam (ZOSYN) IVPB 3.375 g  Status:  Discontinued     3.375 g 12.5 mL/hr over 240 Minutes Intravenous Every 8 hours 02/25/18 1326 03/02/18 1037   02/25/18 0645  vancomycin (VANCOCIN) IVPB 1000 mg/200 mL premix     1,000  mg 200 mL/hr over 60 Minutes Intravenous  Once 02/25/18 0639 02/25/18 0814   02/25/18 0630  piperacillin-tazobactam (ZOSYN) IVPB 3.375 g     3.375 g 100 mL/hr over 30 Minutes Intravenous  Once 02/25/18 0619 02/25/18 0715   02/25/18 0630  vancomycin (VANCOCIN) injection 1 g  Status:  Discontinued     1 g Intravenous  Once 02/25/18 0619 02/25/18 0639      Medications   Scheduled Meds: . enoxaparin (LOVENOX) injection  40 mg Subcutaneous Q24H  . feeding supplement  1 Container Oral TID BM  . furosemide  20 mg Intravenous Daily  . levothyroxine  25 mcg Oral QAC breakfast  . mouth rinse  15 mL Mouth Rinse BID   Continuous Infusions: . meropenem (MERREM) IV 1 g (03/04/18 0513)   PRN Meds:.acetaminophen, ipratropium-albuterol, morphine injection, ondansetron **OR** ondansetron (ZOFRAN) IV, oxyCODONE, sodium chloride flush   Data Review:   Micro Results Recent Results (from the past 240 hour(s))  Aerobic/Anaerobic Culture (surgical/deep wound)     Status: None   Collection Time: 02/25/18  9:50 AM  Result Value Ref Range Status   Specimen Description   Final    ABDOMEN FLUID Performed at [facility], [address] [place], [place] [address]    Special Requests   Final    NONE Performed at [facility], [address] [place], [place] [address]    Gram Stain   Final    ABUNDANT WBC PRESENT, PREDOMINANTLY PMN RARE GRAM POSITIVE COCCI    Culture   Final    RARE ESCHERICHIA COLI RARE PSEUDOMONAS AERUGINOSA NO ANAEROBES ISOLATED Performed at [facility], [address], [place], [place] [address]    Report Status 03/02/2018 FINAL  Final   Organism ID, Bacteria ESCHERICHIA COLI  Final   Organism ID, Bacteria PSEUDOMONAS AERUGINOSA  Final      Susceptibility   Escherichia coli - MIC*    AMPICILLIN <=2 SENSITIVE Sensitive     CEFAZOLIN <=4 SENSITIVE  Sensitive     CEFEPIME <=1 SENSITIVE Sensitive     CEFTAZIDIME <=1 SENSITIVE Sensitive      CEFTRIAXONE <=1 SENSITIVE Sensitive     CIPROFLOXACIN <=0.25 SENSITIVE Sensitive     GENTAMICIN <=1 SENSITIVE Sensitive     IMIPENEM <=0.25 SENSITIVE Sensitive     TRIMETH/SULFA <=20 SENSITIVE Sensitive     AMPICILLIN/SULBACTAM <=2 SENSITIVE Sensitive     PIP/TAZO <=4 SENSITIVE Sensitive     Extended ESBL NEGATIVE Sensitive     * RARE ESCHERICHIA COLI   Pseudomonas aeruginosa - MIC*    CEFTAZIDIME 4 SENSITIVE Sensitive     CIPROFLOXACIN <=0.25 SENSITIVE Sensitive     GENTAMICIN <=1 SENSITIVE Sensitive     IMIPENEM <=0.25 SENSITIVE Sensitive     PIP/TAZO 8 SENSITIVE Sensitive     CEFEPIME 2 SENSITIVE Sensitive     * RARE PSEUDOMONAS AERUGINOSA  MRSA PCR Screening     Status: None   Collection Time: 02/25/18  3:35 PM  Result Value Ref Range Status   MRSA by PCR NEGATIVE NEGATIVE Final    Comment:        The GeneXpert MRSA Assay (FDA approved for NASAL specimens only), is one component of a comprehensive MRSA colonization surveillance program. It is not intended to diagnose MRSA infection nor to guide or monitor treatment for MRSA infections. Performed at [facility], [address] [place], [place] [address]   MRSA PCR Screening     Status: None   Collection Time: 03/01/18  1:55 PM  Result Value Ref Range Status   MRSA by PCR NEGATIVE NEGATIVE Final    Comment:        The GeneXpert MRSA Assay (FDA approved for NASAL specimens only), is one component of a comprehensive MRSA colonization surveillance program. It is not intended to diagnose MRSA infection nor to guide or monitor treatment for MRSA infections. Performed at [facility], [address] [place], [place] [address]     Radiology Reports Ct Angio Chest Pe W Or Wo Contrast  Result Date: 03/01/2018 CLINICAL DATA:  Desaturation, chest tightness. EXAM: CT ANGIOGRAPHY CHEST WITH CONTRAST TECHNIQUE: Multidetector CT imaging of the chest was performed using the standard protocol during bolus  administration of intravenous contrast. Multiplanar CT image reconstructions and MIPs were obtained to evaluate the vascular anatomy. CONTRAST:  75mL ISOVUE-370 IOPAMIDOL (ISOVUE-370) INJECTION 76% COMPARISON:  Chest x-ray from earlier same day. FINDINGS: Cardiovascular: There is no pulmonary embolism identified within the main, lobar or segmental pulmonary arteries bilaterally. No thoracic aortic aneurysm or evidence of aortic dissection. Mild aortic atherosclerosis. Mild cardiomegaly. No significant pericardial effusion. Mediastinum/Nodes: No mass or enlarged lymph nodes seen within the mediastinum or perihilar regions. Esophagus appears normal. Trachea and central bronchi are unremarkable. Lungs/Pleura: Diffuse ground-glass opacities throughout both lungs, mid and upper lobe predominant. Denser consolidation within the superior segment of the RIGHT lower lobe, highly suspicious for pneumonia. Small RIGHT pleural effusion. No pneumothorax. Upper Abdomen: No acute findings within the upper abdomen. Musculoskeletal: No acute or suspicious osseous finding. RIGHT humeral head/neck fracture, incompletely imaged, presumably chronic. Mild degenerative change within the thoracic spine. Review of the MIP images confirms the above findings. IMPRESSION: 1. Diffuse ground-glass opacities throughout both lungs, mid and upper lung predominant. Favor pulmonary edema. Differential includes atypical pneumonias such as viral or fungal, interstitial pneumonias, edema related to volume overload/CHF, chronic interstitial diseases, hypersensitivity pneumonitis, and respiratory bronchiolitis. 2. Probable RIGHT lower lobe pneumonia (superior segment of the RIGHT lower lobe). 3.  Small RIGHT pleural effusion. 4. No pulmonary embolism. 5. RIGHT humeral head/neck fracture, incompletely imaged, presumably chronic. No previous x-rays to assess acuity. 6. Mild aortic atherosclerosis. Aortic Atherosclerosis (ICD10-I70.0). These results will be  called to the ordering clinician or representative by the Radiologist Assistant, and communication documented in the PACS or zVision Dashboard. Electronically Signed   By: Bary RichardStan  Maynard M.D.   On: 03/01/2018 09:13   Ct Abdomen Pelvis W Contrast  Result Date: 02/25/2018 CLINICAL DATA:  Acute onset of generalized abdominal pain and tenderness. Loss of 36 pounds over the past month. EXAM: CT ABDOMEN AND PELVIS WITH CONTRAST TECHNIQUE: Multidetector CT imaging of the abdomen and pelvis was performed using the standard protocol following bolus administration of intravenous contrast. CONTRAST:  75mL OMNIPAQUE IOHEXOL 300 MG/ML  SOLN COMPARISON:  CT of the abdomen and pelvis from 12/08/2017 FINDINGS: Lower chest: Mild right basilar atelectasis or scarring is noted. Scattered coronary artery calcifications are seen. Hepatobiliary: The liver is grossly unremarkable. Trace free fluid is noted tracking about the liver. The gallbladder is diffusely distended. There is dilatation of the common bile duct to 1.0 cm in diameter, raising concern for distal obstruction. Pancreas: The pancreas is within normal limits. Spleen: The spleen is unremarkable in appearance. Adrenals/Urinary Tract: The adrenal glands are unremarkable in appearance. The kidneys are within normal limits. There is no evidence of hydronephrosis. No renal or ureteral stones are identified. No perinephric stranding is seen. Stomach/Bowel: Scattered free air is noted within the abdomen and pelvis, raising concern for bowel perforation. Mild complex fluid is noted tracking about large and small bowel loops. The site of bowel perforation is not definitely characterized on CT. There is diffuse wall thickening along the sigmoid colon, concerning for an underlying infectious or inflammatory colitis. The scattered pattern of air adjacent to the proximal sigmoid colon may correspond to the site of perforation. Scattered diverticulosis is noted along the sigmoid colon.  The small bowel is grossly unremarkable. Residual wall thickening is noted along the antrum of the stomach and proximal duodenum. Adjacent prominent nodes are again seen, and underlying mass is a concern. The appendix is unremarkable in appearance. Vascular/Lymphatic: Scattered calcification is seen along the abdominal aorta and its branches. The abdominal aorta is otherwise grossly unremarkable. The inferior vena cava is grossly unremarkable. No retroperitoneal lymphadenopathy is seen. No pelvic sidewall lymphadenopathy is identified. Reproductive: The bladder is mildly distended and grossly unremarkable. The patient is status post hysterectomy. No suspicious adnexal masses are seen. Other: No additional soft tissue abnormalities are seen. Musculoskeletal: No acute osseous abnormalities are identified. The visualized musculature is unremarkable in appearance. IMPRESSION: 1. Bowel perforation, with scattered free air in the abdomen and pelvis. Mild complex fluid tracks about large and small bowel loops. The site of bowel perforation is not definitely characterized. It may be at the proximal sigmoid colon. 2. Diffuse wall thickening along the sigmoid colon is compatible with underlying infectious or inflammatory colitis. 3. Residual wall thickening along the antrum of the stomach and proximal duodenum. Adjacent prominent nodes again noted. Underlying mass is suspected. Given prior gastric ulceration, perforation in this region is also possibility. Endoscopy would be helpful for further evaluation, when and as deemed clinically appropriate. 4. Dilatation of the common bile duct to 1.0 cm in diameter, raising concern for distal obstruction. Diffuse gallbladder distention. Would correlate with LFTs. 5. Scattered diverticulosis along the sigmoid colon. Aortic Atherosclerosis (ICD10-I70.0). These results were called by telephone at the time of interpretation on 02/25/2018 at 6:09  am to Dr. York Cerise, who verbally  acknowledged these results. Electronically Signed   By: Roanna Raider M.D.   On: 02/25/2018 06:15   Dg Chest Port 1 View  Result Date: 03/02/2018 CLINICAL DATA:  Respiratory failure. EXAM: PORTABLE CHEST 1 VIEW COMPARISON:  Radiograph of March 01, 2018. FINDINGS: The heart size and mediastinal contours are within normal limits. No pneumothorax or pleural effusion is noted. Right-sided PICC line is noted which is unchanged in position. Stable diffuse reticular densities are noted throughout both lungs most consistent with pulmonary edema, although atypical infection cannot be excluded. Stable proximal right humeral fracture is noted. IMPRESSION: Stable diffuse reticular densities are noted throughout both lungs most consistent with pulmonary edema, although atypical infection cannot be excluded. Electronically Signed   By: Lupita Raider, M.D.   On: 03/02/2018 09:10   Dg Chest Port 1 View  Result Date: 03/01/2018 CLINICAL DATA:  Acute onset of respiratory distress. Patient in abdominal surgery. EXAM: PORTABLE CHEST 1 VIEW COMPARISON:  Chest radiograph performed 02/26/2018 FINDINGS: Diffuse bilateral airspace opacification is new from the prior study and may reflect flash pulmonary edema or possibly pneumonia. ARDS cannot be excluded. A small right pleural effusion is noted. No pneumothorax is seen The cardiomediastinal silhouette is normal in size. A displaced right humeral head and neck fracture is noted. The patient's right PICC is noted ending about the mid SVC. IMPRESSION: 1. Diffuse bilateral airspace opacification is new from the prior study and may reflect flash pulmonary edema or possibly pneumonia. ARDS cannot be excluded. Small right pleural effusion noted. 2. Displaced right humeral head and neck fracture noted. Electronically Signed   By: Roanna Raider M.D.   On: 03/01/2018 04:42   Dg Chest Port 1 View  Result Date: 02/26/2018 CLINICAL DATA:  67 year old female status post gastric tube placement  EXAM: PORTABLE CHEST 1 VIEW COMPARISON:  CT scan of the abdomen and pelvis 02/25/2018 FINDINGS: A nasogastric tube is present and projects over the left upper quadrant in the expected position. Cardiac and mediastinal contours are within normal limits. Atherosclerotic calcifications present in the transverse aorta. Mild left retrocardiac airspace opacity favored to reflect atelectasis. No pleural effusion or pneumothorax. No acute osseous abnormality. IMPRESSION: 1. Well-positioned gastrostomy tube. 2. Left retrocardiac airspace opacity may reflect atelectasis or infiltrate. Atelectasis is favored. 3.  Aortic Atherosclerosis (ICD10-170.0) Electronically Signed   By: Malachy Moan M.D.   On: 02/26/2018 14:15   [place] Ekg Site Rite  Result Date: 02/26/2018 If Site Rite image not attached, placement could not be confirmed due to current cardiac rhythm.    CBC Recent Labs  Lab 02/25/18 1440 02/26/18 0413 02/27/18 0804 02/28/18 0512 03/01/18 0626 03/02/18 0507 03/03/18 0410  WBC 6.5 7.8 10.2 16.9* 18.9* 9.9 9.0  HGB 8.2* 7.4* 8.3* 8.4* 8.7* 7.6* 7.4*  HCT 23.6* 22.6* 24.1* 24.8* 26.0* 22.0* 21.2*  PLT 698* 740* 622* 690* 715* 505* 526*  MCV 96.3 95.1 92.5 91.5 92.0 92.0 91.9  MCH 33.4 31.2 31.9 30.8 30.8 32.0 32.2  MCHC 34.6 32.8 34.5 33.7 33.4 34.7 35.0  RDW 14.0 14.6* 16.2* 15.9* 15.5* 15.0* 14.7*  LYMPHSABS 0.5* 0.9* 1.1  --   --   --  1.0  MONOABS 0.2 0.3 0.4  --   --   --  0.4  EOSABS 0.0 0.0 0.1  --   --   --  0.2  BASOSABS 0.0 0.0 0.0  --   --   --  0.1  Chemistries  Recent Labs  Lab 02/26/18 0413 02/27/18 0804 02/28/18 0512 03/01/18 0626 03/02/18 0507 03/03/18 0410  NA 133* 134* 132* 131* 136 136  K 3.8 4.0 3.9 4.1 3.8 3.4*  CL 103 104 101 99* 99* 97*  CO2 24 24 25 25 30 27   GLUCOSE 133* 94 121* 115* 93 82  BUN 12 7 <5* <5* 6 10  CREATININE 0.61 0.52 0.50 0.47 0.49 0.49  CALCIUM 7.1* 7.4* 7.2* 7.2* 7.1* 7.3*  MG 1.7 1.9  --   --   --  1.8  AST 24 21  --   --   --    --   ALT 11* 11*  --   --   --   --   ALKPHOS 68 74  --   --   --   --   BILITOT 0.2* 0.7  --   --   --   --    ------------------------------------------------------------------------------------------------------------------ estimated creatinine clearance is 54.6 mL/min (by C-G formula based on SCr of 0.49 mg/dL). ------------------------------------------------------------------------------------------------------------------ No results for input(s): HGBA1C in the last 72 hours. ------------------------------------------------------------------------------------------------------------------ No results for input(s): CHOL, HDL, LDLCALC, TRIG, CHOLHDL, LDLDIRECT in the last 72 hours. ------------------------------------------------------------------------------------------------------------------ No results for input(s): TSH, T4TOTAL, T3FREE, THYROIDAB in the last 72 hours.  Invalid input(s): FREET3 ------------------------------------------------------------------------------------------------------------------ No results for input(s): VITAMINB12, FOLATE, FERRITIN, TIBC, IRON, RETICCTPCT in the last 72 hours.  Coagulation profile No results for input(s): INR, PROTIME in the last 168 hours.  No results for input(s): DDIMER in the last 72 hours.  Cardiac Enzymes No results for input(s): CKMB, TROPONINI, MYOGLOBIN in the last 168 hours.  Invalid input(s): CK ------------------------------------------------------------------------------------------------------------------ Invalid input(s): POCBNP    Assessment & Plan  Patient is a 66 year old lady with history of COPD, chronic tobacco abuse status post surgery noted to have acute respiratory failure Patient is a 67 year old lady with history of COPD, chronic tobacco abuse status post surgery noted to have acute respiratory failure   1.  Acute hypoxic respiratory failure differential diagnosis based on the CT scan  Possible  atypical pneumonia, ARDS Continue vancomycin and meropenem Echo of the heart shows no significant dysfunction Wean oxygen much as possible Appreciate intensivist input Repeat chest x-ray today Continue IV antibiotic  2.    Acute blood loss anemia follow hemoglobin  3.  Hypothyroidism continue Synthroid  4.  GERD continue Protonix  5.  Hyperlipidemia continue Crestor  6.  Nicotine abuse smoking cessation provided       Code Status Orders  (From admission, onward)        Start     Ordered   02/25/18 1317  Full code  Continuous     02/25/18 1326    Code Status History    This patient has a current code status but no historical code status.    Advance Directive Documentation     Most Recent Value  Type of Advance Directive  Healthcare Power of Attorney, Living will  Pre-existing out of facility DNR order (yellow form or pink MOST form)  -  "MOST" Form in Place?  -             DVT Prophylaxis  Lovenox   Lab Results  Component Value Date   PLT 526 (H) 03/03/2018     Time Spent in minutes   35 minutes spent  Auburn Bilberry M.D on 03/04/2018 at 12:33 PM  Between 7am to 6pm - Pager - [phone]  After 6pm go to [email or web address] - password  [employer]  [phone]

## 2018-03-04 NOTE — Progress Notes (Signed)
7 Days Post-Op  Subjective: Status post Hartman's procedure ventilatory status has improved considerably she feels well ostomy output yet  Objective: Vital signs in last 24 hours: Temp:  [98.5 F (36.9 C)-98.6 F (37 C)] 98.6 F (37 C) (06/07 0200) Pulse Rate:  [89-115] 91 (06/07 0700) Resp:  [14-29] 17 (06/07 0700) BP: (93-129)/(50-92) 119/70 (06/07 0700) SpO2:  [89 %-100 %] 100 % (06/07 0700) FiO2 (%):  [33 %-46 %] 35 % (06/07 0723) Last BM Date: 02/28/18  Intake/Output from previous day: 06/06 0701 - 06/07 0700 In: 500 [P.O.:240; I.V.:10; IV Piggyback:250] Out: 1065 [Urine:875; Drains:190] Intake/Output this shift: No intake/output data recorded.  Physical exam:  Wound granulating no ostomy output yet  Lab Results: CBC  Recent Labs    03/02/18 0507 03/03/18 0410  WBC 9.9 9.0  HGB 7.6* 7.4*  HCT 22.0* 21.2*  PLT 505* 526*   BMET Recent Labs    03/02/18 0507 03/03/18 0410  NA 136 136  K 3.8 3.4*  CL 99* 97*  CO2 30 27  GLUCOSE 93 82  BUN 6 10  CREATININE 0.49 0.49  CALCIUM 7.1* 7.3*   PT/INR No results for input(s): LABPROT, INR in the last 72 hours. ABG Recent Labs    03/01/18 1639  PHART 7.44  HCO3 30.6*    Studies/Results: No results found.  Anti-infectives: Anti-infectives (From admission, onward)   Start     Dose/Rate Route Frequency Ordered Stop   03/02/18 1500  meropenem (MERREM) 1 g in sodium chloride 0.9 % 100 mL IVPB  Status:  Discontinued     1 g 200 mL/hr over 30 Minutes Intravenous Every 12 hours 03/02/18 1149 03/02/18 1151   03/02/18 1400  meropenem (MERREM) 1 g in sodium chloride 0.9 % 100 mL IVPB     1 g 200 mL/hr over 30 Minutes Intravenous Every 8 hours 03/02/18 1151     03/02/18 0400  vancomycin (VANCOCIN) IVPB 750 mg/150 ml premix     750 mg 150 mL/hr over 60 Minutes Intravenous Every 18 hours 03/01/18 1014     03/01/18 1015  vancomycin (VANCOCIN) IVPB 1000 mg/200 mL premix     1,000 mg 200 mL/hr over 60 Minutes  Intravenous  Once 03/01/18 1012 03/01/18 1206   02/25/18 1600  piperacillin-tazobactam (ZOSYN) IVPB 3.375 g  Status:  Discontinued     3.375 g 12.5 mL/hr over 240 Minutes Intravenous Every 8 hours 02/25/18 1326 03/02/18 1037   02/25/18 0645  vancomycin (VANCOCIN) IVPB 1000 mg/200 mL premix     1,000 mg 200 mL/hr over 60 Minutes Intravenous  Once 02/25/18 0639 02/25/18 0814   02/25/18 0630  piperacillin-tazobactam (ZOSYN) IVPB 3.375 g     3.375 g 100 mL/hr over 30 Minutes Intravenous  Once 02/25/18 0619 02/25/18 0715   02/25/18 0630  vancomycin (VANCOCIN) injection 1 g  Status:  Discontinued     1 g Intravenous  Once 02/25/18 0619 02/25/18 0639      Assessment/Plan: s/p Procedure(s): EXPLORATORY LAPAROTOMY, PARTMAN'S PROCEDURE, APPENDECTOMY, COLOSTOMY   Improved pulmonary status.  Discussed with intensivist about removing her from the ICU rolls and placing her in MedSurg.  I will ask nursing to place a suppository in the ostomy to stimulate ostomy output.  Continue clear liquids only.  Lattie Hawichard E Aviela Blundell, MD, FACS  03/04/2018

## 2018-03-04 NOTE — Consult Note (Signed)
WOC Nurse ostomy follow up Stoma type/location: LUQ Colostomy Stomal assessment/size: 1 3/8" pink and moist.  NO stool in pouch yet.    Peristomal assessment: intact Treatment options for stomal/peristomal skin: barrier ring Output none  BEdside RN has given suppository Ostomy pouching: 2pc. 2 1/4" pouch with barrier ring  Education provided: Patient in ICU and is very winded.  We discuss twice weekly pouch changes, showering and emptying (once bowel function is established).  HAs active bowel sounds and is passing gas. She will need to return demonstrate cutting and application at next pouch change.  SUpplies at bedside.  [place] [facility] [place]: Yes WOc team will continue to follow.  Maple HudsonKaren Nalini Alcaraz RN BSN CWON Pager [phone]

## 2018-03-04 NOTE — Progress Notes (Signed)
PT Cancellation Note  Patient Details Name: Annette Miller MRN: [id number] DOB: 1951/03/06   Cancelled Treatment:    Reason Eval/Treat Not Completed: Pain limiting ability to participate Pt laying in bed complaining of pain and needing assist with positioning.  PT was able to get her somewhat more comfortable but pt flatly refuses to do any activity today.  She states that she got up to recliner with a lot of assist from CNA this AM but did not last long secondary to severe pain from her incision.  Ultimately she states she'd be willing to try tomorrow but not today.  Malachi ProGalen R Tayjah Lobdell, DPT 03/04/2018, 3:50 PM

## 2018-03-05 LAB — CBC
HEMATOCRIT: 26.9 % — AB (ref 35.0–47.0)
HEMOGLOBIN: 8.9 g/dL — AB (ref 12.0–16.0)
MCH: 31.2 pg (ref 26.0–34.0)
MCHC: 33.1 g/dL (ref 32.0–36.0)
MCV: 94.3 fL (ref 80.0–100.0)
Platelets: 511 10*3/uL — ABNORMAL HIGH (ref 150–440)
RBC: 2.85 MIL/uL — ABNORMAL LOW (ref 3.80–5.20)
RDW: 14.8 % — AB (ref 11.5–14.5)
WBC: 7.7 10*3/uL (ref 3.6–11.0)

## 2018-03-05 LAB — GLUCOSE, CAPILLARY
GLUCOSE-CAPILLARY: 93 mg/dL (ref 65–99)
GLUCOSE-CAPILLARY: 79 mg/dL (ref 65–99)
GLUCOSE-CAPILLARY: 104 mg/dL — AB (ref 65–99)
Glucose-Capillary: 89 mg/dL (ref 65–99)
Glucose-Capillary: 96 mg/dL (ref 65–99)

## 2018-03-05 LAB — BASIC METABOLIC PANEL
Anion gap: 6 (ref 5–15)
BUN: 11 mg/dL (ref 6–20)
CO2: 31 mmol/L (ref 22–32)
Calcium: 7.2 mg/dL — ABNORMAL LOW (ref 8.9–10.3)
Chloride: 98 mmol/L — ABNORMAL LOW (ref 101–111)
Creatinine, Ser: 0.58 mg/dL (ref 0.44–1.00)
GFR calc Af Amer: >60 mL/min (ref >60)
GLUCOSE: 101 mg/dL — AB (ref 65–99)
POTASSIUM: 3.5 mmol/L (ref 3.5–5.1)
Sodium: 135 mmol/L (ref 135–145)

## 2018-03-05 MED ORDER — BISACODYL 10 MG RE SUPP
10.0000 mg | Freq: Once | RECTAL | Status: AC
  Administered 2018-03-05: 10 mg via RECTAL
  Filled 2018-03-05: qty 1

## 2018-03-05 NOTE — Progress Notes (Signed)
8 Days Post-Op  Subjective: No ostomy output yet patient feels well wants to advance diet but has not yet passed any gas or stool into her bag.  Objective: Vital signs in last 24 hours: Temp:  [98.5 F (36.9 C)-99.8 F (37.7 C)] 98.5 F (36.9 C) (06/08 0521) Pulse Rate:  [89-100] 89 (06/08 0522) Resp:  [20-23] 20 (06/08 0521) BP: (113-133)/(66-73) 115/66 (06/08 0522) SpO2:  [94 %-100 %] 100 % (06/08 0521) FiO2 (%):  [35 %] 35 % (06/07 0800) Last BM Date: 02/25/18  Intake/Output from previous day: 06/07 0701 - 06/08 0700 In: 650 [P.O.:480; IV Piggyback:100] Out: 960 [Urine:850; Drains:110] Intake/Output this shift: No intake/output data recorded.  Physical exam:  Abdomen soft nondistended nontender Ostomy pink but no function yet wound is clean with granulation  Lab Results: CBC  Recent Labs    03/03/18 0410 03/05/18 0648  WBC 9.0 7.7  HGB 7.4* 8.9*  HCT 21.2* 26.9*  PLT 526* 511*   BMET Recent Labs    03/03/18 0410 03/05/18 0648  NA 136 135  K 3.4* 3.5  CL 97* 98*  CO2 27 31  GLUCOSE 82 101*  BUN 10 11  CREATININE 0.49 0.58  CALCIUM 7.3* 7.2*   PT/INR No results for input(s): LABPROT, INR in the last 72 hours. ABG No results for input(s): PHART, HCO3 in the last 72 hours.  Invalid input(s): PCO2, PO2  Studies/Results: Dg Chest Port 1 View  Result Date: 03/04/2018 CLINICAL DATA:  Shortness of breath EXAM: PORTABLE CHEST 1 VIEW COMPARISON:  03/02/2018 FINDINGS: 1244 hours. The cardio pericardial silhouette is enlarged. Interval improvement in the diffuse interstitial opacity seen previously with some residual basilar predominant interstitial disease on the current study. Right PICC line remains in place with tip overlying the distal SVC. Proximal right humerus fracture again noted. Telemetry leads overlie the chest. IMPRESSION: Interval improvement in the diffuse interstitial disease seen previously. Electronically Signed   By: Kennith Center M.D.   On:  03/04/2018 13:02    Anti-infectives: Anti-infectives (From admission, onward)   Start     Dose/Rate Route Frequency Ordered Stop   03/02/18 1500  meropenem (MERREM) 1 g in sodium chloride 0.9 % 100 mL IVPB  Status:  Discontinued     1 g 200 mL/hr over 30 Minutes Intravenous Every 12 hours 03/02/18 1149 03/02/18 1151   03/02/18 1400  meropenem (MERREM) 1 g in sodium chloride 0.9 % 100 mL IVPB     1 g 200 mL/hr over 30 Minutes Intravenous Every 8 hours 03/02/18 1151     03/02/18 0400  vancomycin (VANCOCIN) IVPB 750 mg/150 ml premix  Status:  Discontinued     750 mg 150 mL/hr over 60 Minutes Intravenous Every 18 hours 03/01/18 1014 03/04/18 1028   03/01/18 1015  vancomycin (VANCOCIN) IVPB 1000 mg/200 mL premix     1,000 mg 200 mL/hr over 60 Minutes Intravenous  Once 03/01/18 1012 03/01/18 1206   02/25/18 1600  piperacillin-tazobactam (ZOSYN) IVPB 3.375 g  Status:  Discontinued     3.375 g 12.5 mL/hr over 240 Minutes Intravenous Every 8 hours 02/25/18 1326 03/02/18 1037   02/25/18 0645  vancomycin (VANCOCIN) IVPB 1000 mg/200 mL premix     1,000 mg 200 mL/hr over 60 Minutes Intravenous  Once 02/25/18 0639 02/25/18 0814   02/25/18 0630  piperacillin-tazobactam (ZOSYN) IVPB 3.375 g     3.375 g 100 mL/hr over 30 Minutes Intravenous  Once 02/25/18 0619 02/25/18 0715   02/25/18 0630  vancomycin (VANCOCIN) injection 1 g  Status:  Discontinued     1 g Intravenous  Once 02/25/18 0619 02/25/18 [id number]      Assessment/Plan: s/p Procedure(s): EXPLORATORY LAPAROTOMY, PARTMAN'S PROCEDURE, APPENDECTOMY, COLOSTOMY   Patient doing very well but no ostomy output yet.  Would continue on clear liquids until ostomy starts being functional.  We will place another Dulcolax suppository in hopes of stimulating function.  I discussed placement with the patient she does not want to go to a nursing home or rehab facility and wants to go home.  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  Anticipate discharge the next few days.  Lattie Hawichard E Laquan Beier, MD, FACS  03/05/2018

## 2018-03-05 NOTE — Progress Notes (Addendum)
Patient dressing changed, tissue is pink with some yellowish colored areas. Patient tolerated well, moderate amount of drainage present yellowish, clear. New colostomy bag also placed, tissue beefy pink with small amount of mucous present, no bowel output as of yet.

## 2018-03-05 NOTE — Progress Notes (Signed)
PT Cancellation Note  Patient Details Name: Annette Miller MRN: [id number] DOB: 1951/05/19   Cancelled Treatment:    Reason Eval/Treat Not Completed: Pain limiting ability to participate(Patient refused PT eval stating too tired and in too much pain. )  Patient refused PT evaluation for second time today due to fatigue/pain. Patient requests to be seen at later time. Will attempt again at later time/date.   Precious BardMarina Ezzie Senat, PT, DPT   03/05/2018, 11:37 AM

## 2018-03-05 NOTE — Progress Notes (Signed)
PT Cancellation Note  Patient Details Name: Shelby Mattocksamela S Sterling MRN: [id number] DOB: 06-29-1951   Cancelled Treatment:    Reason Eval/Treat Not Completed: Other (comment)(Patient sleeping first attempt, requesting pain medication second attempt. )  First attempt, patient sleeping, would not wake up. Second attempt patient requesting pain medication. Will return at a later time to attempt.  Precious BardMarina Fouad Taul, PT, DPT   03/05/2018, 10:01 AM

## 2018-03-05 NOTE — Progress Notes (Signed)
[employer] - [place] [facility]                                                                                                                                                                                  Patient Demographics   Annette Miller, is a 67 y.o. female, DOB - 1951-04-12, ZOX:[id number]  Admit date - 02/25/2018   Admitting Physician Leafy Ro, MD  Outpatient Primary MD for the patient is Steele Sizer, MD   LOS - 8  Subjective: Patient doing much better oxygen is being weaned down  Review of Systems:   CONSTITUTIONAL: No documented fever. No fatigue, weakness. No weight gain, no weight loss.  EYES: No blurry or double vision.  ENT: No tinnitus. No postnasal drip. No redness of the oropharynx.  RESPIRATORY: No cough, no wheeze, no hemoptysis.  Positive dyspnea.  CARDIOVASCULAR: No chest pain. No orthopnea. No palpitations. No syncope.  GASTROINTESTINAL: No nausea, no vomiting or diarrhea.  Positive abdominal pain. No melena or hematochezia.  GENITOURINARY: No dysuria or hematuria.  ENDOCRINE: No polyuria or nocturia. No heat or cold intolerance.  HEMATOLOGY: No anemia. No bruising. No bleeding.  INTEGUMENTARY: No rashes. No lesions.  MUSCULOSKELETAL: No arthritis. No swelling. No gout.  NEUROLOGIC: No numbness, tingling, or ataxia. No seizure-type activity.  PSYCHIATRIC: No anxiety. No insomnia. No ADD.    Vitals:   Vitals:   03/04/18 1120 03/04/18 2110 03/05/18 0521 03/05/18 0522  BP: 133/73 113/70  115/66  Pulse: 97 100 92 89  Resp:  20 20   Temp: 98.9 F (37.2 C) 99 F (37.2 C) 98.5 F (36.9 C)   TempSrc: Oral Oral Oral   SpO2: 94% 100% 100%   Weight:      Height:        Wt Readings from Last 3 Encounters:  03/01/18 50 kg (110 lb 3.7 oz)  01/14/18 48.5 kg (107 lb)  01/13/18 48.5 kg (107 lb)     Intake/Output Summary (Last 24 hours) at 03/05/2018 1258 Last data filed at 03/05/2018 1008 Gross per 24 hour  Intake 240 ml   Output 960 ml  Net -720 ml    Physical Exam:   GENERAL: Pleasant-appearing in no apparent distress.  HEAD, EYES, EARS, NOSE AND THROAT: Atraumatic, normocephalic. Extraocular muscles are intact. Pupils equal and reactive to light. Sclerae anicteric. No conjunctival injection. No oro-pharyngeal erythema.  NECK: Supple. There is no jugular venous distention. No bruits, no lymphadenopathy, no thyromegaly.  HEART: Regular rate and rhythm,. No murmurs, no rubs, no clicks.  LUNGS: Rhonchus breath sounds bilaterally ABDOMEN: Soft, postop EXTREMITIES: No evidence of any cyanosis, clubbing, or peripheral edema.  +2  pedal and radial pulses bilaterally.  NEUROLOGIC: The patient is alert, awake, and oriented x3 with no focal motor or sensory deficits appreciated bilaterally.  SKIN: Moist and warm with no rashes appreciated.  Psych: Not anxious, depressed LN: No inguinal LN enlargement    Antibiotics   Anti-infectives (From admission, onward)   Start     Dose/Rate Route Frequency Ordered Stop   03/02/18 1500  meropenem (MERREM) 1 g in sodium chloride 0.9 % 100 mL IVPB  Status:  Discontinued     1 g 200 mL/hr over 30 Minutes Intravenous Every 12 hours 03/02/18 1149 03/02/18 1151   03/02/18 1400  meropenem (MERREM) 1 g in sodium chloride 0.9 % 100 mL IVPB     1 g 200 mL/hr over 30 Minutes Intravenous Every 8 hours 03/02/18 1151     03/02/18 0400  vancomycin (VANCOCIN) IVPB 750 mg/150 ml premix  Status:  Discontinued     750 mg 150 mL/hr over 60 Minutes Intravenous Every 18 hours 03/01/18 1014 03/04/18 1028   03/01/18 1015  vancomycin (VANCOCIN) IVPB 1000 mg/200 mL premix     1,000 mg 200 mL/hr over 60 Minutes Intravenous  Once 03/01/18 1012 03/01/18 1206   02/25/18 1600  piperacillin-tazobactam (ZOSYN) IVPB 3.375 g  Status:  Discontinued     3.375 g 12.5 mL/hr over 240 Minutes Intravenous Every 8 hours 02/25/18 1326 03/02/18 1037   02/25/18 0645  vancomycin (VANCOCIN) IVPB 1000 mg/200 mL  premix     1,000 mg 200 mL/hr over 60 Minutes Intravenous  Once 02/25/18 0639 02/25/18 0814   02/25/18 0630  piperacillin-tazobactam (ZOSYN) IVPB 3.375 g     3.375 g 100 mL/hr over 30 Minutes Intravenous  Once 02/25/18 0619 02/25/18 0715   02/25/18 0630  vancomycin (VANCOCIN) injection 1 g  Status:  Discontinued     1 g Intravenous  Once 02/25/18 0619 02/25/18 0639      Medications   Scheduled Meds: . enoxaparin (LOVENOX) injection  40 mg Subcutaneous Q24H  . feeding supplement  1 Container Oral TID BM  . furosemide  20 mg Intravenous Daily  . levothyroxine  25 mcg Oral QAC breakfast  . mouth rinse  15 mL Mouth Rinse BID   Continuous Infusions: . meropenem (MERREM) IV Stopped (03/05/18 0603)   PRN Meds:.acetaminophen, ipratropium-albuterol, morphine injection, ondansetron **OR** ondansetron (ZOFRAN) IV, oxyCODONE, sodium chloride flush   Data Review:   Micro Results Recent Results (from the past 240 hour(s))  Aerobic/Anaerobic Culture (surgical/deep wound)     Status: None   Collection Time: 02/25/18  9:50 AM  Result Value Ref Range Status   Specimen Description   Final    ABDOMEN FLUID Performed at [facility], [address], [place], [place] [address]    Special Requests   Final    NONE Performed at [facility], [address], [place], [place] [address]    Gram Stain   Final    ABUNDANT WBC PRESENT, PREDOMINANTLY PMN RARE GRAM POSITIVE COCCI    Culture   Final    RARE ESCHERICHIA COLI RARE PSEUDOMONAS AERUGINOSA NO ANAEROBES ISOLATED Performed at [facility], [address], [place], [place] [address]    Report Status 03/02/2018 FINAL  Final   Organism ID, Bacteria ESCHERICHIA COLI  Final   Organism ID, Bacteria PSEUDOMONAS AERUGINOSA  Final      Susceptibility   Escherichia coli - MIC*    AMPICILLIN <=2 SENSITIVE Sensitive     CEFAZOLIN <=4  SENSITIVE Sensitive     CEFEPIME <=1 SENSITIVE Sensitive     CEFTAZIDIME <=1  SENSITIVE Sensitive     CEFTRIAXONE <=1 SENSITIVE Sensitive     CIPROFLOXACIN <=0.25 SENSITIVE Sensitive     GENTAMICIN <=1 SENSITIVE Sensitive     IMIPENEM <=0.25 SENSITIVE Sensitive     TRIMETH/SULFA <=20 SENSITIVE Sensitive     AMPICILLIN/SULBACTAM <=2 SENSITIVE Sensitive     PIP/TAZO <=4 SENSITIVE Sensitive     Extended ESBL NEGATIVE Sensitive     * RARE ESCHERICHIA COLI   Pseudomonas aeruginosa - MIC*    CEFTAZIDIME 4 SENSITIVE Sensitive     CIPROFLOXACIN <=0.25 SENSITIVE Sensitive     GENTAMICIN <=1 SENSITIVE Sensitive     IMIPENEM <=0.25 SENSITIVE Sensitive     PIP/TAZO 8 SENSITIVE Sensitive     CEFEPIME 2 SENSITIVE Sensitive     * RARE PSEUDOMONAS AERUGINOSA  MRSA PCR Screening     Status: None   Collection Time: 02/25/18  3:35 PM  Result Value Ref Range Status   MRSA by PCR NEGATIVE NEGATIVE Final    Comment:        The GeneXpert MRSA Assay (FDA approved for NASAL specimens only), is one component of a comprehensive MRSA colonization surveillance program. It is not intended to diagnose MRSA infection nor to guide or monitor treatment for MRSA infections. Performed at [facility], [address] [place], [place] [address]   MRSA PCR Screening     Status: None   Collection Time: 03/01/18  1:55 PM  Result Value Ref Range Status   MRSA by PCR NEGATIVE NEGATIVE Final    Comment:        The GeneXpert MRSA Assay (FDA approved for NASAL specimens only), is one component of a comprehensive MRSA colonization surveillance program. It is not intended to diagnose MRSA infection nor to guide or monitor treatment for MRSA infections. Performed at [facility], [address] [place], [place] [address]     Radiology Reports Ct Angio Chest Pe W Or Wo Contrast  Result Date: 03/01/2018 CLINICAL DATA:  Desaturation, chest tightness. EXAM: CT ANGIOGRAPHY CHEST WITH CONTRAST TECHNIQUE: Multidetector CT imaging of the chest was performed using the  standard protocol during bolus administration of intravenous contrast. Multiplanar CT image reconstructions and MIPs were obtained to evaluate the vascular anatomy. CONTRAST:  75mL ISOVUE-370 IOPAMIDOL (ISOVUE-370) INJECTION 76% COMPARISON:  Chest x-ray from earlier same day. FINDINGS: Cardiovascular: There is no pulmonary embolism identified within the main, lobar or segmental pulmonary arteries bilaterally. No thoracic aortic aneurysm or evidence of aortic dissection. Mild aortic atherosclerosis. Mild cardiomegaly. No significant pericardial effusion. Mediastinum/Nodes: No mass or enlarged lymph nodes seen within the mediastinum or perihilar regions. Esophagus appears normal. Trachea and central bronchi are unremarkable. Lungs/Pleura: Diffuse ground-glass opacities throughout both lungs, mid and upper lobe predominant. Denser consolidation within the superior segment of the RIGHT lower lobe, highly suspicious for pneumonia. Small RIGHT pleural effusion. No pneumothorax. Upper Abdomen: No acute findings within the upper abdomen. Musculoskeletal: No acute or suspicious osseous finding. RIGHT humeral head/neck fracture, incompletely imaged, presumably chronic. Mild degenerative change within the thoracic spine. Review of the MIP images confirms the above findings. IMPRESSION: 1. Diffuse ground-glass opacities throughout both lungs, mid and upper lung predominant. Favor pulmonary edema. Differential includes atypical pneumonias such as viral or fungal, interstitial pneumonias, edema related to volume overload/CHF, chronic interstitial diseases, hypersensitivity pneumonitis, and respiratory bronchiolitis. 2. Probable RIGHT lower lobe pneumonia (superior segment of the RIGHT lower lobe).  3. Small RIGHT pleural effusion. 4. No pulmonary embolism. 5. RIGHT humeral head/neck fracture, incompletely imaged, presumably chronic. No previous x-rays to assess acuity. 6. Mild aortic atherosclerosis. Aortic Atherosclerosis  (ICD10-I70.0). These results will be called to the ordering clinician or representative by the Radiologist Assistant, and communication documented in the PACS or zVision Dashboard. Electronically Signed   By: Bary Richard M.D.   On: 03/01/2018 09:13   Ct Abdomen Pelvis W Contrast  Result Date: 02/25/2018 CLINICAL DATA:  Acute onset of generalized abdominal pain and tenderness. Loss of 36 pounds over the past month. EXAM: CT ABDOMEN AND PELVIS WITH CONTRAST TECHNIQUE: Multidetector CT imaging of the abdomen and pelvis was performed using the standard protocol following bolus administration of intravenous contrast. CONTRAST:  75mL OMNIPAQUE IOHEXOL 300 MG/ML  SOLN COMPARISON:  CT of the abdomen and pelvis from 12/08/2017 FINDINGS: Lower chest: Mild right basilar atelectasis or scarring is noted. Scattered coronary artery calcifications are seen. Hepatobiliary: The liver is grossly unremarkable. Trace free fluid is noted tracking about the liver. The gallbladder is diffusely distended. There is dilatation of the common bile duct to 1.0 cm in diameter, raising concern for distal obstruction. Pancreas: The pancreas is within normal limits. Spleen: The spleen is unremarkable in appearance. Adrenals/Urinary Tract: The adrenal glands are unremarkable in appearance. The kidneys are within normal limits. There is no evidence of hydronephrosis. No renal or ureteral stones are identified. No perinephric stranding is seen. Stomach/Bowel: Scattered free air is noted within the abdomen and pelvis, raising concern for bowel perforation. Mild complex fluid is noted tracking about large and small bowel loops. The site of bowel perforation is not definitely characterized on CT. There is diffuse wall thickening along the sigmoid colon, concerning for an underlying infectious or inflammatory colitis. The scattered pattern of air adjacent to the proximal sigmoid colon may correspond to the site of perforation. Scattered  diverticulosis is noted along the sigmoid colon. The small bowel is grossly unremarkable. Residual wall thickening is noted along the antrum of the stomach and proximal duodenum. Adjacent prominent nodes are again seen, and underlying mass is a concern. The appendix is unremarkable in appearance. Vascular/Lymphatic: Scattered calcification is seen along the abdominal aorta and its branches. The abdominal aorta is otherwise grossly unremarkable. The inferior vena cava is grossly unremarkable. No retroperitoneal lymphadenopathy is seen. No pelvic sidewall lymphadenopathy is identified. Reproductive: The bladder is mildly distended and grossly unremarkable. The patient is status post hysterectomy. No suspicious adnexal masses are seen. Other: No additional soft tissue abnormalities are seen. Musculoskeletal: No acute osseous abnormalities are identified. The visualized musculature is unremarkable in appearance. IMPRESSION: 1. Bowel perforation, with scattered free air in the abdomen and pelvis. Mild complex fluid tracks about large and small bowel loops. The site of bowel perforation is not definitely characterized. It may be at the proximal sigmoid colon. 2. Diffuse wall thickening along the sigmoid colon is compatible with underlying infectious or inflammatory colitis. 3. Residual wall thickening along the antrum of the stomach and proximal duodenum. Adjacent prominent nodes again noted. Underlying mass is suspected. Given prior gastric ulceration, perforation in this region is also possibility. Endoscopy would be helpful for further evaluation, when and as deemed clinically appropriate. 4. Dilatation of the common bile duct to 1.0 cm in diameter, raising concern for distal obstruction. Diffuse gallbladder distention. Would correlate with LFTs. 5. Scattered diverticulosis along the sigmoid colon. Aortic Atherosclerosis (ICD10-I70.0). These results were called by telephone at the time of interpretation on 02/25/2018 at  6:09 am to Dr. York Cerise, who verbally acknowledged these results. Electronically Signed   By: Roanna Raider M.D.   On: 02/25/2018 06:15   Dg Chest Port 1 View  Result Date: 03/04/2018 CLINICAL DATA:  Shortness of breath EXAM: PORTABLE CHEST 1 VIEW COMPARISON:  03/02/2018 FINDINGS: 1244 hours. The cardio pericardial silhouette is enlarged. Interval improvement in the diffuse interstitial opacity seen previously with some residual basilar predominant interstitial disease on the current study. Right PICC line remains in place with tip overlying the distal SVC. Proximal right humerus fracture again noted. Telemetry leads overlie the chest. IMPRESSION: Interval improvement in the diffuse interstitial disease seen previously. Electronically Signed   By: Kennith Center M.D.   On: 03/04/2018 13:02   Dg Chest Port 1 View  Result Date: 03/02/2018 CLINICAL DATA:  Respiratory failure. EXAM: PORTABLE CHEST 1 VIEW COMPARISON:  Radiograph of March 01, 2018. FINDINGS: The heart size and mediastinal contours are within normal limits. No pneumothorax or pleural effusion is noted. Right-sided PICC line is noted which is unchanged in position. Stable diffuse reticular densities are noted throughout both lungs most consistent with pulmonary edema, although atypical infection cannot be excluded. Stable proximal right humeral fracture is noted. IMPRESSION: Stable diffuse reticular densities are noted throughout both lungs most consistent with pulmonary edema, although atypical infection cannot be excluded. Electronically Signed   By: Lupita Raider, M.D.   On: 03/02/2018 09:10   Dg Chest Port 1 View  Result Date: 03/01/2018 CLINICAL DATA:  Acute onset of respiratory distress. Patient in abdominal surgery. EXAM: PORTABLE CHEST 1 VIEW COMPARISON:  Chest radiograph performed 02/26/2018 FINDINGS: Diffuse bilateral airspace opacification is new from the prior study and may reflect flash pulmonary edema or possibly pneumonia. ARDS cannot  be excluded. A small right pleural effusion is noted. No pneumothorax is seen The cardiomediastinal silhouette is normal in size. A displaced right humeral head and neck fracture is noted. The patient's right PICC is noted ending about the mid SVC. IMPRESSION: 1. Diffuse bilateral airspace opacification is new from the prior study and may reflect flash pulmonary edema or possibly pneumonia. ARDS cannot be excluded. Small right pleural effusion noted. 2. Displaced right humeral head and neck fracture noted. Electronically Signed   By: Roanna Raider M.D.   On: 03/01/2018 04:42   Dg Chest Port 1 View  Result Date: 02/26/2018 CLINICAL DATA:  67 year old female status post gastric tube placement EXAM: PORTABLE CHEST 1 VIEW COMPARISON:  CT scan of the abdomen and pelvis 02/25/2018 FINDINGS: A nasogastric tube is present and projects over the left upper quadrant in the expected position. Cardiac and mediastinal contours are within normal limits. Atherosclerotic calcifications present in the transverse aorta. Mild left retrocardiac airspace opacity favored to reflect atelectasis. No pleural effusion or pneumothorax. No acute osseous abnormality. IMPRESSION: 1. Well-positioned gastrostomy tube. 2. Left retrocardiac airspace opacity may reflect atelectasis or infiltrate. Atelectasis is favored. 3.  Aortic Atherosclerosis (ICD10-170.0) Electronically Signed   By: Malachy Moan M.D.   On: 02/26/2018 14:15   [place] Ekg Site Rite  Result Date: 02/26/2018 If Site Rite image not attached, placement could not be confirmed due to current cardiac rhythm.    CBC Recent Labs  Lab 02/27/18 0804 02/28/18 0512 03/01/18 0626 03/02/18 0507 03/03/18 0410 03/05/18 0648  WBC 10.2 16.9* 18.9* 9.9 9.0 7.7  HGB 8.3* 8.4* 8.7* 7.6* 7.4* 8.9*  HCT 24.1* 24.8* 26.0* 22.0* 21.2* 26.9*  PLT 622* 690* 715* 505* 526* 511*  MCV 92.5 91.5  92.0 92.0 91.9 94.3  MCH 31.9 30.8 30.8 32.0 32.2 31.2  MCHC 34.5 33.7 33.4 34.7 35.0 33.1   RDW 16.2* 15.9* 15.5* 15.0* 14.7* 14.8*  LYMPHSABS 1.1  --   --   --  1.0  --   MONOABS 0.4  --   --   --  0.4  --   EOSABS 0.1  --   --   --  0.2  --   BASOSABS 0.0  --   --   --  0.1  --     Chemistries  Recent Labs  Lab 02/27/18 0804 02/28/18 0512 03/01/18 0626 03/02/18 0507 03/03/18 0410 03/05/18 0648  NA 134* 132* 131* 136 136 135  K 4.0 3.9 4.1 3.8 3.4* 3.5  CL 104 101 99* 99* 97* 98*  CO2 24 25 25 30 27 31   GLUCOSE 94 121* 115* 93 82 101*  BUN 7 <5* <5* 6 10 11   CREATININE 0.52 0.50 0.47 0.49 0.49 0.58  CALCIUM 7.4* 7.2* 7.2* 7.1* 7.3* 7.2*  MG 1.9  --   --   --  1.8  --   AST 21  --   --   --   --   --   ALT 11*  --   --   --   --   --   ALKPHOS 74  --   --   --   --   --   BILITOT 0.7  --   --   --   --   --    ------------------------------------------------------------------------------------------------------------------ estimated creatinine clearance is 54.6 mL/min (by C-G formula based on SCr of 0.58 mg/dL). ------------------------------------------------------------------------------------------------------------------ No results for input(s): HGBA1C in the last 72 hours. ------------------------------------------------------------------------------------------------------------------ No results for input(s): CHOL, HDL, LDLCALC, TRIG, CHOLHDL, LDLDIRECT in the last 72 hours. ------------------------------------------------------------------------------------------------------------------ No results for input(s): TSH, T4TOTAL, T3FREE, THYROIDAB in the last 72 hours.  Invalid input(s): FREET3 ------------------------------------------------------------------------------------------------------------------ No results for input(s): VITAMINB12, FOLATE, FERRITIN, TIBC, IRON, RETICCTPCT in the last 72 hours.  Coagulation profile No results for input(s): INR, PROTIME in the last 168 hours.  No results for input(s): DDIMER in the last 72 hours.  Cardiac  Enzymes No results for input(s): CKMB, TROPONINI, MYOGLOBIN in the last 168 hours.  Invalid input(s): CK ------------------------------------------------------------------------------------------------------------------ Invalid input(s): POCBNP    Assessment & Plan  Patient is a 67 year old lady with history of COPD, chronic tobacco abuse status post surgery noted to have acute respiratory failure Patient is a 67 year old lady with history of COPD, chronic tobacco abuse status post surgery noted to have acute respiratory failure   1.  Acute hypoxic respiratory failure differential diagnosis based on the CT scan  Possible atypical pneumonia, ARDS Continue vancomycin and meropenem Echo of the heart shows no significant dysfunction Wean oxygen much as possible Appreciate intensivist input Repeat chest x-ray- shows improvement. Continue IV antibiotic  2.    Acute blood loss anemia follow hemoglobin  3.  Hypothyroidism continue Synthroid  4.  GERD continue Protonix  5.  Hyperlipidemia continue Crestor  6.  Nicotine abuse smoking cessation provided  7. Perforated viscus, s/p laparotomy, colostomy   Manage per surgical team. No BM yet. On clear liq diet.     Code Status Orders  (From admission, onward)        Start     Ordered   02/25/18 1317  Full code  Continuous     02/25/18 1326    Code Status History    This patient has a  current code status but no historical code status.    Advance Directive Documentation     Most Recent Value  Type of Advance Directive  Healthcare Power of Attorney, Living will  Pre-existing out of facility DNR order (yellow form or pink MOST form)  -  "MOST" Form in Place?  -       DVT Prophylaxis  Lovenox   Lab Results  Component Value Date   PLT 511 (H) 03/05/2018     Time Spent in minutes   35 minutes spent  Altamese Dilling M.D on 03/05/2018 at 12:58 PM  Between 7am to 6pm - Pager - [phone]  After 6pm go to  [email or web address] - Social research officer, government  [employer]   Office  [phone]

## 2018-03-06 ENCOUNTER — Inpatient Hospital Stay: Payer: Medicare HMO

## 2018-03-06 LAB — GLUCOSE, CAPILLARY
GLUCOSE-CAPILLARY: 112 mg/dL — AB (ref 65–99)
GLUCOSE-CAPILLARY: 118 mg/dL — AB (ref 65–99)
Glucose-Capillary: 107 mg/dL — ABNORMAL HIGH (ref 65–99)

## 2018-03-06 NOTE — Progress Notes (Signed)
Weaned patient down to 2 liters of O2 when taken off patient desats to 89%. Will continue trying to wean patient as she tolerates.

## 2018-03-06 NOTE — Progress Notes (Signed)
Dressing changed to open midline incision. Tissue pink with areas of white moderated amount of drainage present clear and yellowish in color, no foul odor noted Patient tolerated well

## 2018-03-06 NOTE — Evaluation (Signed)
Physical Therapy Evaluation Patient Details Name: Annette Miller S Rigdon MRN: [id number] DOB: Mar 12, 1951 Today's Date: 03/06/2018   History of Present Illness  Pt is a 67 y.o. femae who presented to emergency room for acute onset of abdominal pain and admitted for diverticuliti of colon with perforation. Patient underwent laparotomy, Hartman's procedure, appendectomy and colostomy and was in ICU, now on 2C. Pt's PMH is significant for the following: COPD, allergy, depression, goiter, hyperlipidemia, hypothyroidism.   Clinical Impression  Pt eager to work with PT today. Pt reported she lives with her son and he is available 24/7. She has experienced one fall in the last six months, while at work and it results in a R humerus fx. Pt was IND with all ADLs and amb. Prior to hospital admission. Pt limited by decr. SaO2, fatigue, and elevated HR at rest (100s-113bpm). Pt received up in chair upon arrival and wished to remain in chair after session completed, therefore, bed mobility not assessed. Pt progressed from min guard to S during STS txfs, to ensure safety with RW. Pt maintained standing with UE support on RW for 2 minutes prior to requiring rest break 2/2 fatigue. PT did not attempt amb. 2/2 fatigue, elevated resting HR and pt's request to sit back in chair. Pt would benefit from skilled PT to address above deficits and promote optimal return to PLOF.    Follow Up Recommendations Home health PT;Supervision/Assistance - 24 hour(pt adamantly refuses SNF at this time)    Equipment Recommendations  Rolling walker with 5" wheels    Recommendations for Other Services       Precautions / Restrictions Precautions Precautions: Fall;Other (comment)(drains and internal cath) Restrictions Weight Bearing Restrictions: No      Mobility  Bed Mobility Overal bed mobility: (pt received sitting in chair upon arrival)             General bed mobility comments: Not assessed as pt in chair at beginning and  end of session.  Transfers Overall transfer level: Needs assistance Equipment used: Rolling walker (2 wheeled) Transfers: Sit to/from Stand Sit to Stand: Supervision;Min guard         General transfer comment: Min guard during sit to stand txf to ensure safety. Pt demonstrated proper technique and no LOB. Progressed to S during stand to sit.  Ambulation/Gait             General Gait Details: Not attempted 2/2 elevated HR at rest and upon standing and pt c/o fatigue after 2 minutes in standing and performing B hip marches x5 reps. Pt required seated rest break.  Stairs            Wheelchair Mobility    Modified Rankin (Stroke Patients Only)       Balance Overall balance assessment: Independent Sitting-balance support: Feet unsupported Sitting balance-Leahy Scale: Normal     Standing balance support: Bilateral upper extremity supported Standing balance-Leahy Scale: Good Standing balance comment: Pt utilized RW to ensure safety                             Pertinent Vitals/Pain Pain Assessment: No/denies pain    Home Living Family/patient expects to be discharged to:: Private residence Living Arrangements: Children Available Help at Discharge: Family;Available 24 hours/day Type of Home: House Home Access: Stairs to enter Entrance Stairs-Rails: Can reach both Entrance Stairs-Number of Steps: 6 Home Layout: Multi-level;Able to live on main level with bedroom/bathroom Home Equipment: None  Prior Function Level of Independence: Independent               Hand Dominance        Extremity/Trunk Assessment   Upper Extremity Assessment Upper Extremity Assessment: Overall WFL for tasks assessed(pt with hx of R humerus fx 09/2017.)    Lower Extremity Assessment Lower Extremity Assessment: Generalized weakness       Communication   Communication: No difficulties  Cognition Arousal/Alertness: Awake/alert Behavior During Therapy:  WFL for tasks assessed/performed Overall Cognitive Status: Within Functional Limits for tasks assessed                                 General Comments: Pt very excited to work with PT today.      General Comments      Exercises     Assessment/Plan    PT Assessment Patient needs continued PT services  PT Problem List Decreased strength;Decreased activity tolerance;Decreased knowledge of use of DME;Decreased balance;Decreased mobility       PT Treatment Interventions DME instruction;Gait training;Stair training;Functional mobility training;Therapeutic activities;Therapeutic exercise;Balance training;Neuromuscular re-education;Patient/family education    PT Goals (Current goals can be found in the Care Plan section)  Acute Rehab PT Goals Patient Stated Goal: to go home PT Goal Formulation: With patient Time For Goal Achievement: 03/20/18 Potential to Achieve Goals: Good    Frequency Min 2X/week   Barriers to discharge        Co-evaluation               AM-PAC PT "6 Clicks" Daily Activity  Outcome Measure Difficulty turning over in bed (including adjusting bedclothes, sheets and blankets)?: None Difficulty moving from lying on back to sitting on the side of the bed? : None Difficulty sitting down on and standing up from a chair with arms (e.g., wheelchair, bedside commode, etc,.)?: A Little Help needed moving to and from a bed to chair (including a wheelchair)?: A Little Help needed walking in hospital room?: A Little Help needed climbing 3-5 steps with a railing? : A Lot 6 Click Score: 19    End of Session Equipment Utilized During Treatment: Oxygen Activity Tolerance: No increased pain;Patient limited by fatigue Patient left: in chair;with call bell/phone within reach(chair alarm on chair but not on, pt will call RN for assist) Nurse Communication: Mobility status;Other (comment)(SaO2 decr. to 80% without SpO2) PT Visit Diagnosis: Other  abnormalities of gait and mobility (R26.89);Muscle weakness (generalized) (M62.81);History of falling (Z91.81)(one fall at work in 09/2017)    Time: 5409-8119 PT Time Calculation (min) (ACUTE ONLY): 20 min   Charges:   PT Evaluation $PT Eval Low Complexity: 1 Low PT Treatments $Therapeutic Activity: 8-22 mins   PT G Codes:        Zerita Boers, PT,DPT 03/06/18 11:19 AM     Dalante Minus L 03/06/2018, 11:16 AM

## 2018-03-06 NOTE — Progress Notes (Signed)
Patient ID: Annette Miller, female   DOB: 11/05/1950, 67 y.o.   MRN: [id number]     SURGICAL PROGRESS NOTE   Hospital Day(s): 9.   Post op day(s): 9 Days Post-Op.   Interval History: Patient seen and examined, no acute events or new complaints overnight. Patient reports passing gas per ostomy but no stool yet. Refers does not like the broth and wants to progress diet.  Vital signs in last 24 hours: [min-max] current  Temp:  [99.1 F (37.3 C)-99.8 F (37.7 C)] 99.1 F (37.3 C) (06/09 0539) Pulse Rate:  [98-99] 98 (06/09 0539) Resp:  [20] 20 (06/09 0539) BP: (118-128)/(62-99) 118/99 (06/09 0539) SpO2:  [94 %-99 %] 99 % (06/09 0539)     Height: 5\' 2"  (157.5 cm) Weight: 50 kg (110 lb 3.7 oz) BMI (Calculated): 20.16    Physical Exam:  Constitutional: alert, cooperative and no distress  Respiratory: breathing non-labored at rest  Cardiovascular: regular rate and sinus rhythm  Gastrointestinal: soft, non-tender, and non-distended. Ostomy pink and patent. Wounds dry and clean.   Labs:  CBC Latest Ref Rng & Units 03/05/2018 03/03/2018 03/02/2018  WBC 3.6 - 11.0 K/uL 7.7 9.0 9.9  Hemoglobin 12.0 - 16.0 g/dL 2.9(F8.9(L) 7.4(L) 7.6(L)  Hematocrit 35.0 - 47.0 % 26.9(L) 21.2(L) 22.0(L)  Platelets 150 - 440 K/uL 511(H) 526(H) 505(H)   CMP Latest Ref Rng & Units 03/05/2018 03/03/2018 03/02/2018  Glucose 65 - 99 mg/dL 621(H101(H) 82 93  BUN 6 - 20 mg/dL 11 10 6   Creatinine 0.44 - 1.00 mg/dL 0.860.58 5.780.49 4.690.49  Sodium 135 - 145 mmol/L 135 136 136  Potassium 3.5 - 5.1 mmol/L 3.5 3.4(L) 3.8  Chloride 101 - 111 mmol/L 98(L) 97(L) 99(L)  CO2 22 - 32 mmol/L 31 27 30   Calcium 8.9 - 10.3 mg/dL 7.2(L) 7.3(L) 7.1(L)  Total Protein 6.5 - 8.1 g/dL - - -  Total Bilirubin 0.3 - 1.2 mg/dL - - -  Alkaline Phos 38 - 126 U/L - - -  AST 15 - 41 U/L - - -  ALT 14 - 54 U/L - - -   Imaging studies: New abdominal xray shows contras in large intestine. No small bowel dilation.   Assessment/Plan:  67 y.o. female with perforated  diverticulitis 9 Days Post-Op s/p Hartman's procedure.  Recovering slowly. There is air on bag, still no stool. Will progress diet to full liquid. Will assess diet toleration, continue pain management and encourage patient to ambulate.    Gae GallopEdgardo Cintrn-Daz, MD

## 2018-03-06 NOTE — Progress Notes (Signed)
[facility] - [place] at [facility]                                                                                                                                                                                  Patient Demographics   Annette Miller, is a 67 y.o. female, DOB - Jun 30, 1951, ZOX:[id number]  Admit date - 02/25/2018   Admitting Physician Leafy Ro, MD  Outpatient Primary MD for the patient is Steele Sizer, MD   LOS - 9  Subjective: Patient doing much better oxygen is being weaned down. Still on liquid diet.  Review of Systems:   CONSTITUTIONAL: No documented fever. No fatigue, weakness. No weight gain, no weight loss.  EYES: No blurry or double vision.  ENT: No tinnitus. No postnasal drip. No redness of the oropharynx.  RESPIRATORY: No cough, no wheeze, no hemoptysis.  Positive dyspnea.  CARDIOVASCULAR: No chest pain. No orthopnea. No palpitations. No syncope.  GASTROINTESTINAL: No nausea, no vomiting or diarrhea.  Positive abdominal pain. No melena or hematochezia.  GENITOURINARY: No dysuria or hematuria.  ENDOCRINE: No polyuria or nocturia. No heat or cold intolerance.  HEMATOLOGY: No anemia. No bruising. No bleeding.  INTEGUMENTARY: No rashes. No lesions.  MUSCULOSKELETAL: No arthritis. No swelling. No gout.  NEUROLOGIC: No numbness, tingling, or ataxia. No seizure-type activity.  PSYCHIATRIC: No anxiety. No insomnia. No ADD.    Vitals:   Vitals:   03/05/18 0521 03/05/18 0522 03/05/18 1417 03/06/18 0539  BP:  115/66 128/62 (!) 118/99  Pulse: 92 89 99 98  Resp: 20   20  Temp: 98.5 F (36.9 C)  99.8 F (37.7 C) 99.1 F (37.3 C)  TempSrc: Oral  Oral Oral  SpO2: 100%  94% 99%  Weight:      Height:        Wt Readings from Last 3 Encounters:  03/01/18 50 kg (110 lb 3.7 oz)  01/14/18 48.5 kg (107 lb)  01/13/18 48.5 kg (107 lb)     Intake/Output Summary (Last 24 hours) at 03/06/2018 1131 Last data filed at 03/06/2018 0734 Gross per 24  hour  Intake 200 ml  Output 1680 ml  Net -1480 ml    Physical Exam:   GENERAL: Pleasant-appearing in no apparent distress.  HEAD, EYES, EARS, NOSE AND THROAT: Atraumatic, normocephalic. Extraocular muscles are intact. Pupils equal and reactive to light. Sclerae anicteric. No conjunctival injection. No oro-pharyngeal erythema.  NECK: Supple. There is no jugular venous distention. No bruits, no lymphadenopathy, no thyromegaly.  HEART: Regular rate and rhythm,. No murmurs, no rubs, no clicks.  LUNGS: Rhonchus breath sounds bilaterally ABDOMEN: Soft, postop EXTREMITIES: No evidence of any cyanosis, clubbing,  or peripheral edema.  +2 pedal and radial pulses bilaterally.  NEUROLOGIC: The patient is alert, awake, and oriented x3 with no focal motor or sensory deficits appreciated bilaterally.  SKIN: Moist and warm with no rashes appreciated.  Psych: Not anxious, depressed LN: No inguinal LN enlargement    Antibiotics   Anti-infectives (From admission, onward)   Start     Dose/Rate Route Frequency Ordered Stop   03/02/18 1500  meropenem (MERREM) 1 g in sodium chloride 0.9 % 100 mL IVPB  Status:  Discontinued     1 g 200 mL/hr over 30 Minutes Intravenous Every 12 hours 03/02/18 1149 03/02/18 1151   03/02/18 1400  meropenem (MERREM) 1 g in sodium chloride 0.9 % 100 mL IVPB     1 g 200 mL/hr over 30 Minutes Intravenous Every 8 hours 03/02/18 1151     03/02/18 0400  vancomycin (VANCOCIN) IVPB 750 mg/150 ml premix  Status:  Discontinued     750 mg 150 mL/hr over 60 Minutes Intravenous Every 18 hours 03/01/18 1014 03/04/18 1028   03/01/18 1015  vancomycin (VANCOCIN) IVPB 1000 mg/200 mL premix     1,000 mg 200 mL/hr over 60 Minutes Intravenous  Once 03/01/18 1012 03/01/18 1206   02/25/18 1600  piperacillin-tazobactam (ZOSYN) IVPB 3.375 g  Status:  Discontinued     3.375 g 12.5 mL/hr over 240 Minutes Intravenous Every 8 hours 02/25/18 1326 03/02/18 1037   02/25/18 0645  vancomycin (VANCOCIN)  IVPB 1000 mg/200 mL premix     1,000 mg 200 mL/hr over 60 Minutes Intravenous  Once 02/25/18 0639 02/25/18 0814   02/25/18 0630  piperacillin-tazobactam (ZOSYN) IVPB 3.375 g     3.375 g 100 mL/hr over 30 Minutes Intravenous  Once 02/25/18 0619 02/25/18 0715   02/25/18 0630  vancomycin (VANCOCIN) injection 1 g  Status:  Discontinued     1 g Intravenous  Once 02/25/18 0619 02/25/18 0639      Medications   Scheduled Meds: . enoxaparin (LOVENOX) injection  40 mg Subcutaneous Q24H  . feeding supplement  1 Container Oral TID BM  . furosemide  20 mg Intravenous Daily  . levothyroxine  25 mcg Oral QAC breakfast  . mouth rinse  15 mL Mouth Rinse BID   Continuous Infusions: . meropenem (MERREM) IV Stopped (03/06/18 0530)   PRN Meds:.acetaminophen, ipratropium-albuterol, morphine injection, ondansetron **OR** ondansetron (ZOFRAN) IV, oxyCODONE, sodium chloride flush   Data Review:   Micro Results Recent Results (from the past 240 hour(s))  Aerobic/Anaerobic Culture (surgical/deep wound)     Status: None   Collection Time: 02/25/18  9:50 AM  Result Value Ref Range Status   Specimen Description   Final    ABDOMEN FLUID Performed at [facility], [address] [place], [place] [address]    Special Requests   Final    NONE Performed at [facility], [address] [place], [place] [address]    Gram Stain   Final    ABUNDANT WBC PRESENT, PREDOMINANTLY PMN RARE GRAM POSITIVE COCCI    Culture   Final    RARE ESCHERICHIA COLI RARE PSEUDOMONAS AERUGINOSA NO ANAEROBES ISOLATED Performed at [facility], [address], [place], [place] [address]    Report Status 03/02/2018 FINAL  Final   Organism ID, Bacteria ESCHERICHIA COLI  Final   Organism ID, Bacteria PSEUDOMONAS AERUGINOSA  Final      Susceptibility   Escherichia coli - MIC*    AMPICILLIN <=2 SENSITIVE Sensitive  CEFAZOLIN <=4 SENSITIVE Sensitive     CEFEPIME <=1 SENSITIVE Sensitive      CEFTAZIDIME <=1 SENSITIVE Sensitive     CEFTRIAXONE <=1 SENSITIVE Sensitive     CIPROFLOXACIN <=0.25 SENSITIVE Sensitive     GENTAMICIN <=1 SENSITIVE Sensitive     IMIPENEM <=0.25 SENSITIVE Sensitive     TRIMETH/SULFA <=20 SENSITIVE Sensitive     AMPICILLIN/SULBACTAM <=2 SENSITIVE Sensitive     PIP/TAZO <=4 SENSITIVE Sensitive     Extended ESBL NEGATIVE Sensitive     * RARE ESCHERICHIA COLI   Pseudomonas aeruginosa - MIC*    CEFTAZIDIME 4 SENSITIVE Sensitive     CIPROFLOXACIN <=0.25 SENSITIVE Sensitive     GENTAMICIN <=1 SENSITIVE Sensitive     IMIPENEM <=0.25 SENSITIVE Sensitive     PIP/TAZO 8 SENSITIVE Sensitive     CEFEPIME 2 SENSITIVE Sensitive     * RARE PSEUDOMONAS AERUGINOSA  MRSA PCR Screening     Status: None   Collection Time: 02/25/18  3:35 PM  Result Value Ref Range Status   MRSA by PCR NEGATIVE NEGATIVE Final    Comment:        The GeneXpert MRSA Assay (FDA approved for NASAL specimens only), is one component of a comprehensive MRSA colonization surveillance program. It is not intended to diagnose MRSA infection nor to guide or monitor treatment for MRSA infections. Performed at [facility], [address] [place], [place] [address]   MRSA PCR Screening     Status: None   Collection Time: 03/01/18  1:55 PM  Result Value Ref Range Status   MRSA by PCR NEGATIVE NEGATIVE Final    Comment:        The GeneXpert MRSA Assay (FDA approved for NASAL specimens only), is one component of a comprehensive MRSA colonization surveillance program. It is not intended to diagnose MRSA infection nor to guide or monitor treatment for MRSA infections. Performed at [facility], [address], [place], [place] [address]     Radiology Reports Ct Angio Chest Pe W Or Wo Contrast  Result Date: 03/01/2018 CLINICAL DATA:  Desaturation, chest tightness. EXAM: CT ANGIOGRAPHY CHEST WITH CONTRAST TECHNIQUE: Multidetector CT imaging of the chest was performed  using the standard protocol during bolus administration of intravenous contrast. Multiplanar CT image reconstructions and MIPs were obtained to evaluate the vascular anatomy. CONTRAST:  75mL ISOVUE-370 IOPAMIDOL (ISOVUE-370) INJECTION 76% COMPARISON:  Chest x-ray from earlier same day. FINDINGS: Cardiovascular: There is no pulmonary embolism identified within the main, lobar or segmental pulmonary arteries bilaterally. No thoracic aortic aneurysm or evidence of aortic dissection. Mild aortic atherosclerosis. Mild cardiomegaly. No significant pericardial effusion. Mediastinum/Nodes: No mass or enlarged lymph nodes seen within the mediastinum or perihilar regions. Esophagus appears normal. Trachea and central bronchi are unremarkable. Lungs/Pleura: Diffuse ground-glass opacities throughout both lungs, mid and upper lobe predominant. Denser consolidation within the superior segment of the RIGHT lower lobe, highly suspicious for pneumonia. Small RIGHT pleural effusion. No pneumothorax. Upper Abdomen: No acute findings within the upper abdomen. Musculoskeletal: No acute or suspicious osseous finding. RIGHT humeral head/neck fracture, incompletely imaged, presumably chronic. Mild degenerative change within the thoracic spine. Review of the MIP images confirms the above findings. IMPRESSION: 1. Diffuse ground-glass opacities throughout both lungs, mid and upper lung predominant. Favor pulmonary edema. Differential includes atypical pneumonias such as viral or fungal, interstitial pneumonias, edema related to volume overload/CHF, chronic interstitial diseases, hypersensitivity pneumonitis, and respiratory bronchiolitis. 2. Probable RIGHT lower lobe pneumonia (superior segment of the RIGHT  lower lobe). 3. Small RIGHT pleural effusion. 4. No pulmonary embolism. 5. RIGHT humeral head/neck fracture, incompletely imaged, presumably chronic. No previous x-rays to assess acuity. 6. Mild aortic atherosclerosis. Aortic  Atherosclerosis (ICD10-I70.0). These results will be called to the ordering clinician or representative by the Radiologist Assistant, and communication documented in the PACS or zVision Dashboard. Electronically Signed   By: Bary Richard M.D.   On: 03/01/2018 09:13   Ct Abdomen Pelvis W Contrast  Result Date: 02/25/2018 CLINICAL DATA:  Acute onset of generalized abdominal pain and tenderness. Loss of 36 pounds over the past month. EXAM: CT ABDOMEN AND PELVIS WITH CONTRAST TECHNIQUE: Multidetector CT imaging of the abdomen and pelvis was performed using the standard protocol following bolus administration of intravenous contrast. CONTRAST:  75mL OMNIPAQUE IOHEXOL 300 MG/ML  SOLN COMPARISON:  CT of the abdomen and pelvis from 12/08/2017 FINDINGS: Lower chest: Mild right basilar atelectasis or scarring is noted. Scattered coronary artery calcifications are seen. Hepatobiliary: The liver is grossly unremarkable. Trace free fluid is noted tracking about the liver. The gallbladder is diffusely distended. There is dilatation of the common bile duct to 1.0 cm in diameter, raising concern for distal obstruction. Pancreas: The pancreas is within normal limits. Spleen: The spleen is unremarkable in appearance. Adrenals/Urinary Tract: The adrenal glands are unremarkable in appearance. The kidneys are within normal limits. There is no evidence of hydronephrosis. No renal or ureteral stones are identified. No perinephric stranding is seen. Stomach/Bowel: Scattered free air is noted within the abdomen and pelvis, raising concern for bowel perforation. Mild complex fluid is noted tracking about large and small bowel loops. The site of bowel perforation is not definitely characterized on CT. There is diffuse wall thickening along the sigmoid colon, concerning for an underlying infectious or inflammatory colitis. The scattered pattern of air adjacent to the proximal sigmoid colon may correspond to the site of perforation.  Scattered diverticulosis is noted along the sigmoid colon. The small bowel is grossly unremarkable. Residual wall thickening is noted along the antrum of the stomach and proximal duodenum. Adjacent prominent nodes are again seen, and underlying mass is a concern. The appendix is unremarkable in appearance. Vascular/Lymphatic: Scattered calcification is seen along the abdominal aorta and its branches. The abdominal aorta is otherwise grossly unremarkable. The inferior vena cava is grossly unremarkable. No retroperitoneal lymphadenopathy is seen. No pelvic sidewall lymphadenopathy is identified. Reproductive: The bladder is mildly distended and grossly unremarkable. The patient is status post hysterectomy. No suspicious adnexal masses are seen. Other: No additional soft tissue abnormalities are seen. Musculoskeletal: No acute osseous abnormalities are identified. The visualized musculature is unremarkable in appearance. IMPRESSION: 1. Bowel perforation, with scattered free air in the abdomen and pelvis. Mild complex fluid tracks about large and small bowel loops. The site of bowel perforation is not definitely characterized. It may be at the proximal sigmoid colon. 2. Diffuse wall thickening along the sigmoid colon is compatible with underlying infectious or inflammatory colitis. 3. Residual wall thickening along the antrum of the stomach and proximal duodenum. Adjacent prominent nodes again noted. Underlying mass is suspected. Given prior gastric ulceration, perforation in this region is also possibility. Endoscopy would be helpful for further evaluation, when and as deemed clinically appropriate. 4. Dilatation of the common bile duct to 1.0 cm in diameter, raising concern for distal obstruction. Diffuse gallbladder distention. Would correlate with LFTs. 5. Scattered diverticulosis along the sigmoid colon. Aortic Atherosclerosis (ICD10-I70.0). These results were called by telephone at the time of interpretation on  02/25/2018 at 6:09 am to Dr. York CeriseForbach, who verbally acknowledged these results. Electronically Signed   By: Roanna RaiderJeffery  Chang M.D.   On: 02/25/2018 06:15   Dg Chest Port 1 View  Result Date: 03/04/2018 CLINICAL DATA:  Shortness of breath EXAM: PORTABLE CHEST 1 VIEW COMPARISON:  03/02/2018 FINDINGS: 1244 hours. The cardio pericardial silhouette is enlarged. Interval improvement in the diffuse interstitial opacity seen previously with some residual basilar predominant interstitial disease on the current study. Right PICC line remains in place with tip overlying the distal SVC. Proximal right humerus fracture again noted. Telemetry leads overlie the chest. IMPRESSION: Interval improvement in the diffuse interstitial disease seen previously. Electronically Signed   By: Kennith CenterEric  Mansell M.D.   On: 03/04/2018 13:02   Dg Chest Port 1 View  Result Date: 03/02/2018 CLINICAL DATA:  Respiratory failure. EXAM: PORTABLE CHEST 1 VIEW COMPARISON:  Radiograph of March 01, 2018. FINDINGS: The heart size and mediastinal contours are within normal limits. No pneumothorax or pleural effusion is noted. Right-sided PICC line is noted which is unchanged in position. Stable diffuse reticular densities are noted throughout both lungs most consistent with pulmonary edema, although atypical infection cannot be excluded. Stable proximal right humeral fracture is noted. IMPRESSION: Stable diffuse reticular densities are noted throughout both lungs most consistent with pulmonary edema, although atypical infection cannot be excluded. Electronically Signed   By: Lupita RaiderJames  Green Jr, M.D.   On: 03/02/2018 09:10   Dg Chest Port 1 View  Result Date: 03/01/2018 CLINICAL DATA:  Acute onset of respiratory distress. Patient in abdominal surgery. EXAM: PORTABLE CHEST 1 VIEW COMPARISON:  Chest radiograph performed 02/26/2018 FINDINGS: Diffuse bilateral airspace opacification is new from the prior study and may reflect flash pulmonary edema or possibly  pneumonia. ARDS cannot be excluded. A small right pleural effusion is noted. No pneumothorax is seen The cardiomediastinal silhouette is normal in size. A displaced right humeral head and neck fracture is noted. The patient's right PICC is noted ending about the mid SVC. IMPRESSION: 1. Diffuse bilateral airspace opacification is new from the prior study and may reflect flash pulmonary edema or possibly pneumonia. ARDS cannot be excluded. Small right pleural effusion noted. 2. Displaced right humeral head and neck fracture noted. Electronically Signed   By: Roanna RaiderJeffery  Chang M.D.   On: 03/01/2018 04:42   Dg Chest Port 1 View  Result Date: 02/26/2018 CLINICAL DATA:  67 year old female status post gastric tube placement EXAM: PORTABLE CHEST 1 VIEW COMPARISON:  CT scan of the abdomen and pelvis 02/25/2018 FINDINGS: A nasogastric tube is present and projects over the left upper quadrant in the expected position. Cardiac and mediastinal contours are within normal limits. Atherosclerotic calcifications present in the transverse aorta. Mild left retrocardiac airspace opacity favored to reflect atelectasis. No pleural effusion or pneumothorax. No acute osseous abnormality. IMPRESSION: 1. Well-positioned gastrostomy tube. 2. Left retrocardiac airspace opacity may reflect atelectasis or infiltrate. Atelectasis is favored. 3.  Aortic Atherosclerosis (ICD10-170.0) Electronically Signed   By: Malachy MoanHeath  McCullough M.D.   On: 02/26/2018 14:15   Dg Abd Portable 1v  Result Date: 03/06/2018 CLINICAL DATA:  Abdominal discomfort.  History of duodenal carcinoma EXAM: PORTABLE ABDOMEN - 1 VIEW COMPARISON:  CT abdomen and pelvis Feb 25, 2018 FINDINGS: There are drains in the pelvis. There is contrast in portions of colon. There is no appreciable bowel dilatation or air-fluid level to suggest bowel obstruction. No free air. Left lower quadrant ostomy noted. Lung bases are clear. IMPRESSION: Drains in pelvis. Contrast in  portions of colon.  No bowel dilatation or air-fluid level to suggest bowel obstruction. No free air. Left lower quadrant ostomy noted. Electronically Signed   By: Bretta Bang III M.D.   On: 03/06/2018 10:08   [place] Ekg Site Rite  Result Date: 02/26/2018 If Site Rite image not attached, placement could not be confirmed due to current cardiac rhythm.    CBC Recent Labs  Lab 02/28/18 0512 03/01/18 0626 03/02/18 0507 03/03/18 0410 03/05/18 0648  WBC 16.9* 18.9* 9.9 9.0 7.7  HGB 8.4* 8.7* 7.6* 7.4* 8.9*  HCT 24.8* 26.0* 22.0* 21.2* 26.9*  PLT 690* 715* 505* 526* 511*  MCV 91.5 92.0 92.0 91.9 94.3  MCH 30.8 30.8 32.0 32.2 31.2  MCHC 33.7 33.4 34.7 35.0 33.1  RDW 15.9* 15.5* 15.0* 14.7* 14.8*  LYMPHSABS  --   --   --  1.0  --   MONOABS  --   --   --  0.4  --   EOSABS  --   --   --  0.2  --   BASOSABS  --   --   --  0.1  --     Chemistries  Recent Labs  Lab 02/28/18 0512 03/01/18 0626 03/02/18 0507 03/03/18 0410 03/05/18 0648  NA 132* 131* 136 136 135  K 3.9 4.1 3.8 3.4* 3.5  CL 101 99* 99* 97* 98*  CO2 25 25 30 27 31   GLUCOSE 121* 115* 93 82 101*  BUN <5* <5* 6 10 11   CREATININE 0.50 0.47 0.49 0.49 0.58  CALCIUM 7.2* 7.2* 7.1* 7.3* 7.2*  MG  --   --   --  1.8  --    ------------------------------------------------------------------------------------------------------------------ estimated creatinine clearance is 54.6 mL/min (by C-G formula based on SCr of 0.58 mg/dL). ------------------------------------------------------------------------------------------------------------------ No results for input(s): HGBA1C in the last 72 hours. ------------------------------------------------------------------------------------------------------------------ No results for input(s): CHOL, HDL, LDLCALC, TRIG, CHOLHDL, LDLDIRECT in the last 72 hours. ------------------------------------------------------------------------------------------------------------------ No results for input(s): TSH,  T4TOTAL, T3FREE, THYROIDAB in the last 72 hours.  Invalid input(s): FREET3 ------------------------------------------------------------------------------------------------------------------ No results for input(s): VITAMINB12, FOLATE, FERRITIN, TIBC, IRON, RETICCTPCT in the last 72 hours.  Coagulation profile No results for input(s): INR, PROTIME in the last 168 hours.  No results for input(s): DDIMER in the last 72 hours.  Cardiac Enzymes No results for input(s): CKMB, TROPONINI, MYOGLOBIN in the last 168 hours.  Invalid input(s): CK ------------------------------------------------------------------------------------------------------------------ Invalid input(s): POCBNP    Assessment & Plan  Patient is a 67 year old lady with history of COPD, chronic tobacco abuse status post surgery noted to have acute respiratory failure Patient is a 67 year old lady with history of COPD, chronic tobacco abuse status post surgery noted to have acute respiratory failure   1.  Acute hypoxic respiratory failure differential diagnosis based on the CT scan  Possible atypical pneumonia, ARDS Continue vancomycin and meropenem Echo of the heart shows no significant dysfunction Wean oxygen much as possible Appreciate intensivist input Repeat chest x-ray- shows improvement. Continue IV antibiotic  2.    Acute blood loss anemia follow hemoglobin- stable.  3.  Hypothyroidism continue Synthroid  4.  GERD continue Protonix  5.  Hyperlipidemia continue Crestor  6.  Nicotine abuse smoking cessation provided  7. Perforated viscus, s/p laparotomy, colostomy   Manage per surgical team. No BM yet. On clear liq diet.     Code Status Orders  (From admission, onward)        Start     Ordered   02/25/18 1317  Full  code  Continuous     02/25/18 1326    Code Status History    This patient has a current code status but no historical code status.    Advance Directive Documentation     Most  Recent Value  Type of Advance Directive  Healthcare Power of Attorney, Living will  Pre-existing out of facility DNR order (yellow form or pink MOST form)  -  "MOST" Form in Place?  -       DVT Prophylaxis  Lovenox   Lab Results  Component Value Date   PLT 511 (H) 03/05/2018     Time Spent in minutes   35 minutes spent  Altamese Dilling M.D on 03/06/2018 at 11:31 AM  Between 7am to 6pm - Pager - [phone]  After 6pm go to [email or web address] - Social research officer, government  [employer]   Office  [phone]

## 2018-03-07 LAB — GLUCOSE, CAPILLARY
GLUCOSE-CAPILLARY: 106 mg/dL — AB (ref 65–99)
Glucose-Capillary: 133 mg/dL — ABNORMAL HIGH (ref 65–99)

## 2018-03-07 MED ORDER — POLYETHYLENE GLYCOL 3350 17 G PO PACK
17.0000 g | PACK | Freq: Two times a day (BID) | ORAL | Status: DC
  Administered 2018-03-07 – 2018-03-12 (×11): 17 g via ORAL
  Filled 2018-03-07 (×11): qty 1

## 2018-03-07 MED ORDER — MAGNESIUM CITRATE PO SOLN
1.0000 | Freq: Once | ORAL | Status: AC
  Administered 2018-03-07: 1 via ORAL
  Filled 2018-03-07: qty 296

## 2018-03-07 NOTE — Care Management (Signed)
Weaning down to 1 liter per nasal cannula. Dr. Hazle Quantintron-Diaz indicated at she will be advanced to full liquid diet. No air in ostomy bag. No bowel movement. Encouraged to ambulate Annette GreetBrenda S Robben Jagiello RN MSN CCM Care Management [phone]

## 2018-03-07 NOTE — Progress Notes (Signed)
S/p Hartmann's Hypoxemia responding to O2 and lasix Taking PO,  still weak + flatus no real fecal matter  PE NAD Abd: incision open wound healing secondary intention, no infection. Drains in place, serous fluid   A/p Doing well Start miralax Regular diet Mobilize DC in am?

## 2018-03-07 NOTE — Progress Notes (Signed)
[employer] - [place] at [facility]                                                                                                                                                                                  Patient Demographics   Annette Miller, is a 67 y.o. female, DOB - 1951/06/17, JWJ:[id number]  Admit date - 02/25/2018   Admitting Physician Leafy Ro, MD  Outpatient Primary MD for the patient is Steele Sizer, MD   LOS - 10  Subjective: Patient doing much better oxygen is being weaned down. Now on full liquid diet.  Review of Systems:   CONSTITUTIONAL: No documented fever. No fatigue, weakness. No weight gain, no weight loss.  EYES: No blurry or double vision.  ENT: No tinnitus. No postnasal drip. No redness of the oropharynx.  RESPIRATORY: No cough, no wheeze, no hemoptysis.  no dyspnea.  CARDIOVASCULAR: No chest pain. No orthopnea. No palpitations. No syncope.  GASTROINTESTINAL: No nausea, no vomiting or diarrhea.  Positive abdominal pain. No melena or hematochezia.  GENITOURINARY: No dysuria or hematuria.  ENDOCRINE: No polyuria or nocturia. No heat or cold intolerance.  HEMATOLOGY: No anemia. No bruising. No bleeding.  INTEGUMENTARY: No rashes. No lesions.  MUSCULOSKELETAL: No arthritis. No swelling. No gout.  NEUROLOGIC: No numbness, tingling, or ataxia. No seizure-type activity.  PSYCHIATRIC: No anxiety. No insomnia. No ADD.    Vitals:   Vitals:   03/07/18 0510 03/07/18 0931 03/07/18 0933 03/07/18 1248  BP: 129/63 120/73  121/66  Pulse: (!) 101 99  96  Resp: 18 18  14   Temp: 97.9 F (36.6 C) 99.1 F (37.3 C)  98.6 F (37 C)  TempSrc: Oral Oral  Oral  SpO2: 93% 98% 97% 95%  Weight:      Height:        Wt Readings from Last 3 Encounters:  03/01/18 50 kg (110 lb 3.7 oz)  01/14/18 48.5 kg (107 lb)  01/13/18 48.5 kg (107 lb)     Intake/Output Summary (Last 24 hours) at 03/07/2018 1405 Last data filed at 03/07/2018 1229 Gross per  24 hour  Intake 340 ml  Output 1752.5 ml  Net -1412.5 ml    Physical Exam:   GENERAL: Pleasant-appearing in no apparent distress.  HEAD, EYES, EARS, NOSE AND THROAT: Atraumatic, normocephalic. Extraocular muscles are intact. Pupils equal and reactive to light. Sclerae anicteric. No conjunctival injection. No oro-pharyngeal erythema.  NECK: Supple. There is no jugular venous distention. No bruits, no lymphadenopathy, no thyromegaly.  HEART: Regular rate and rhythm,. No murmurs, no rubs, no clicks.  LUNGS: Rhonchus breath sounds bilaterally ABDOMEN: Soft, postop, colostomy in place. EXTREMITIES: No evidence  of any cyanosis, clubbing, or peripheral edema.  +2 pedal and radial pulses bilaterally.  NEUROLOGIC: The patient is alert, awake, and oriented x3 with no focal motor or sensory deficits appreciated bilaterally.  SKIN: Moist and warm with no rashes appreciated.  Psych: Not anxious, depressed LN: No inguinal LN enlargement    Antibiotics   Anti-infectives (From admission, onward)   Start     Dose/Rate Route Frequency Ordered Stop   03/02/18 1500  meropenem (MERREM) 1 g in sodium chloride 0.9 % 100 mL IVPB  Status:  Discontinued     1 g 200 mL/hr over 30 Minutes Intravenous Every 12 hours 03/02/18 1149 03/02/18 1151   03/02/18 1400  meropenem (MERREM) 1 g in sodium chloride 0.9 % 100 mL IVPB     1 g 200 mL/hr over 30 Minutes Intravenous Every 8 hours 03/02/18 1151     03/02/18 0400  vancomycin (VANCOCIN) IVPB 750 mg/150 ml premix  Status:  Discontinued     750 mg 150 mL/hr over 60 Minutes Intravenous Every 18 hours 03/01/18 1014 03/04/18 1028   03/01/18 1015  vancomycin (VANCOCIN) IVPB 1000 mg/200 mL premix     1,000 mg 200 mL/hr over 60 Minutes Intravenous  Once 03/01/18 1012 03/01/18 1206   02/25/18 1600  piperacillin-tazobactam (ZOSYN) IVPB 3.375 g  Status:  Discontinued     3.375 g 12.5 mL/hr over 240 Minutes Intravenous Every 8 hours 02/25/18 1326 03/02/18 1037   02/25/18  0645  vancomycin (VANCOCIN) IVPB 1000 mg/200 mL premix     1,000 mg 200 mL/hr over 60 Minutes Intravenous  Once 02/25/18 0639 02/25/18 0814   02/25/18 0630  piperacillin-tazobactam (ZOSYN) IVPB 3.375 g     3.375 g 100 mL/hr over 30 Minutes Intravenous  Once 02/25/18 0619 02/25/18 0715   02/25/18 0630  vancomycin (VANCOCIN) injection 1 g  Status:  Discontinued     1 g Intravenous  Once 02/25/18 0619 02/25/18 0639      Medications   Scheduled Meds: . enoxaparin (LOVENOX) injection  40 mg Subcutaneous Q24H  . feeding supplement  1 Container Oral TID BM  . levothyroxine  25 mcg Oral QAC breakfast  . mouth rinse  15 mL Mouth Rinse BID  . polyethylene glycol  17 g Oral BID   Continuous Infusions: . meropenem (MERREM) IV 1 g (03/07/18 1359)   PRN Meds:.acetaminophen, ipratropium-albuterol, morphine injection, ondansetron **OR** ondansetron (ZOFRAN) IV, oxyCODONE, sodium chloride flush   Data Review:   Micro Results Recent Results (from the past 240 hour(s))  MRSA PCR Screening     Status: None   Collection Time: 02/25/18  3:35 PM  Result Value Ref Range Status   MRSA by PCR NEGATIVE NEGATIVE Final    Comment:        The GeneXpert MRSA Assay (FDA approved for NASAL specimens only), is one component of a comprehensive MRSA colonization surveillance program. It is not intended to diagnose MRSA infection nor to guide or monitor treatment for MRSA infections. Performed at [facility], [address] [place], [place] [address]   MRSA PCR Screening     Status: None   Collection Time: 03/01/18  1:55 PM  Result Value Ref Range Status   MRSA by PCR NEGATIVE NEGATIVE Final    Comment:        The GeneXpert MRSA Assay (FDA approved for NASAL specimens only), is one component of a comprehensive MRSA colonization surveillance program. It is not intended to diagnose MRSA infection nor to  guide or monitor treatment for MRSA infections. Performed at [facility], [address] [place], [place] [address]     Radiology Reports Ct Angio Chest Pe W Or Wo Contrast  Result Date: 03/01/2018 CLINICAL DATA:  Desaturation, chest tightness. EXAM: CT ANGIOGRAPHY CHEST WITH CONTRAST TECHNIQUE: Multidetector CT imaging of the chest was performed using the standard protocol during bolus administration of intravenous contrast. Multiplanar CT image reconstructions and MIPs were obtained to evaluate the vascular anatomy. CONTRAST:  75mL ISOVUE-370 IOPAMIDOL (ISOVUE-370) INJECTION 76% COMPARISON:  Chest x-ray from earlier same day. FINDINGS: Cardiovascular: There is no pulmonary embolism identified within the main, lobar or segmental pulmonary arteries bilaterally. No thoracic aortic aneurysm or evidence of aortic dissection. Mild aortic atherosclerosis. Mild cardiomegaly. No significant pericardial effusion. Mediastinum/Nodes: No mass or enlarged lymph nodes seen within the mediastinum or perihilar regions. Esophagus appears normal. Trachea and central bronchi are unremarkable. Lungs/Pleura: Diffuse ground-glass opacities throughout both lungs, mid and upper lobe predominant. Denser consolidation within the superior segment of the RIGHT lower lobe, highly suspicious for pneumonia. Small RIGHT pleural effusion. No pneumothorax. Upper Abdomen: No acute findings within the upper abdomen. Musculoskeletal: No acute or suspicious osseous finding. RIGHT humeral head/neck fracture, incompletely imaged, presumably chronic. Mild degenerative change within the thoracic spine. Review of the MIP images confirms the above findings. IMPRESSION: 1. Diffuse ground-glass opacities throughout both lungs, mid and upper lung predominant. Favor pulmonary edema. Differential includes atypical pneumonias such as viral or fungal, interstitial pneumonias, edema related to volume overload/CHF, chronic interstitial diseases, hypersensitivity pneumonitis, and respiratory bronchiolitis. 2. Probable RIGHT  lower lobe pneumonia (superior segment of the RIGHT lower lobe). 3. Small RIGHT pleural effusion. 4. No pulmonary embolism. 5. RIGHT humeral head/neck fracture, incompletely imaged, presumably chronic. No previous x-rays to assess acuity. 6. Mild aortic atherosclerosis. Aortic Atherosclerosis (ICD10-I70.0). These results will be called to the ordering clinician or representative by the Radiologist Assistant, and communication documented in the PACS or zVision Dashboard. Electronically Signed   By: Bary Richard M.D.   On: 03/01/2018 09:13   Ct Abdomen Pelvis W Contrast  Result Date: 02/25/2018 CLINICAL DATA:  Acute onset of generalized abdominal pain and tenderness. Loss of 36 pounds over the past month. EXAM: CT ABDOMEN AND PELVIS WITH CONTRAST TECHNIQUE: Multidetector CT imaging of the abdomen and pelvis was performed using the standard protocol following bolus administration of intravenous contrast. CONTRAST:  75mL OMNIPAQUE IOHEXOL 300 MG/ML  SOLN COMPARISON:  CT of the abdomen and pelvis from 12/08/2017 FINDINGS: Lower chest: Mild right basilar atelectasis or scarring is noted. Scattered coronary artery calcifications are seen. Hepatobiliary: The liver is grossly unremarkable. Trace free fluid is noted tracking about the liver. The gallbladder is diffusely distended. There is dilatation of the common bile duct to 1.0 cm in diameter, raising concern for distal obstruction. Pancreas: The pancreas is within normal limits. Spleen: The spleen is unremarkable in appearance. Adrenals/Urinary Tract: The adrenal glands are unremarkable in appearance. The kidneys are within normal limits. There is no evidence of hydronephrosis. No renal or ureteral stones are identified. No perinephric stranding is seen. Stomach/Bowel: Scattered free air is noted within the abdomen and pelvis, raising concern for bowel perforation. Mild complex fluid is noted tracking about large and small bowel loops. The site of bowel perforation  is not definitely characterized on CT. There is diffuse wall thickening along the sigmoid colon, concerning for an underlying infectious or inflammatory colitis. The scattered pattern of air adjacent to the proximal sigmoid colon may  correspond to the site of perforation. Scattered diverticulosis is noted along the sigmoid colon. The small bowel is grossly unremarkable. Residual wall thickening is noted along the antrum of the stomach and proximal duodenum. Adjacent prominent nodes are again seen, and underlying mass is a concern. The appendix is unremarkable in appearance. Vascular/Lymphatic: Scattered calcification is seen along the abdominal aorta and its branches. The abdominal aorta is otherwise grossly unremarkable. The inferior vena cava is grossly unremarkable. No retroperitoneal lymphadenopathy is seen. No pelvic sidewall lymphadenopathy is identified. Reproductive: The bladder is mildly distended and grossly unremarkable. The patient is status post hysterectomy. No suspicious adnexal masses are seen. Other: No additional soft tissue abnormalities are seen. Musculoskeletal: No acute osseous abnormalities are identified. The visualized musculature is unremarkable in appearance. IMPRESSION: 1. Bowel perforation, with scattered free air in the abdomen and pelvis. Mild complex fluid tracks about large and small bowel loops. The site of bowel perforation is not definitely characterized. It may be at the proximal sigmoid colon. 2. Diffuse wall thickening along the sigmoid colon is compatible with underlying infectious or inflammatory colitis. 3. Residual wall thickening along the antrum of the stomach and proximal duodenum. Adjacent prominent nodes again noted. Underlying mass is suspected. Given prior gastric ulceration, perforation in this region is also possibility. Endoscopy would be helpful for further evaluation, when and as deemed clinically appropriate. 4. Dilatation of the common bile duct to 1.0 cm in  diameter, raising concern for distal obstruction. Diffuse gallbladder distention. Would correlate with LFTs. 5. Scattered diverticulosis along the sigmoid colon. Aortic Atherosclerosis (ICD10-I70.0). These results were called by telephone at the time of interpretation on 02/25/2018 at 6:09 am to Dr. York CeriseForbach, who verbally acknowledged these results. Electronically Signed   By: Roanna RaiderJeffery  Chang M.D.   On: 02/25/2018 06:15   Dg Chest Port 1 View  Result Date: 03/04/2018 CLINICAL DATA:  Shortness of breath EXAM: PORTABLE CHEST 1 VIEW COMPARISON:  03/02/2018 FINDINGS: 1244 hours. The cardio pericardial silhouette is enlarged. Interval improvement in the diffuse interstitial opacity seen previously with some residual basilar predominant interstitial disease on the current study. Right PICC line remains in place with tip overlying the distal SVC. Proximal right humerus fracture again noted. Telemetry leads overlie the chest. IMPRESSION: Interval improvement in the diffuse interstitial disease seen previously. Electronically Signed   By: Kennith CenterEric  Mansell M.D.   On: 03/04/2018 13:02   Dg Chest Port 1 View  Result Date: 03/02/2018 CLINICAL DATA:  Respiratory failure. EXAM: PORTABLE CHEST 1 VIEW COMPARISON:  Radiograph of March 01, 2018. FINDINGS: The heart size and mediastinal contours are within normal limits. No pneumothorax or pleural effusion is noted. Right-sided PICC line is noted which is unchanged in position. Stable diffuse reticular densities are noted throughout both lungs most consistent with pulmonary edema, although atypical infection cannot be excluded. Stable proximal right humeral fracture is noted. IMPRESSION: Stable diffuse reticular densities are noted throughout both lungs most consistent with pulmonary edema, although atypical infection cannot be excluded. Electronically Signed   By: Lupita RaiderJames  Green Jr, M.D.   On: 03/02/2018 09:10   Dg Chest Port 1 View  Result Date: 03/01/2018 CLINICAL DATA:  Acute onset  of respiratory distress. Patient in abdominal surgery. EXAM: PORTABLE CHEST 1 VIEW COMPARISON:  Chest radiograph performed 02/26/2018 FINDINGS: Diffuse bilateral airspace opacification is new from the prior study and may reflect flash pulmonary edema or possibly pneumonia. ARDS cannot be excluded. A small right pleural effusion is noted. No pneumothorax is seen The cardiomediastinal silhouette  is normal in size. A displaced right humeral head and neck fracture is noted. The patient's right PICC is noted ending about the mid SVC. IMPRESSION: 1. Diffuse bilateral airspace opacification is new from the prior study and may reflect flash pulmonary edema or possibly pneumonia. ARDS cannot be excluded. Small right pleural effusion noted. 2. Displaced right humeral head and neck fracture noted. Electronically Signed   By: Roanna Raider M.D.   On: 03/01/2018 04:42   Dg Chest Port 1 View  Result Date: 02/26/2018 CLINICAL DATA:  67 year old female status post gastric tube placement EXAM: PORTABLE CHEST 1 VIEW COMPARISON:  CT scan of the abdomen and pelvis 02/25/2018 FINDINGS: A nasogastric tube is present and projects over the left upper quadrant in the expected position. Cardiac and mediastinal contours are within normal limits. Atherosclerotic calcifications present in the transverse aorta. Mild left retrocardiac airspace opacity favored to reflect atelectasis. No pleural effusion or pneumothorax. No acute osseous abnormality. IMPRESSION: 1. Well-positioned gastrostomy tube. 2. Left retrocardiac airspace opacity may reflect atelectasis or infiltrate. Atelectasis is favored. 3.  Aortic Atherosclerosis (ICD10-170.0) Electronically Signed   By: Malachy Moan M.D.   On: 02/26/2018 14:15   Dg Abd Portable 1v  Result Date: 03/06/2018 CLINICAL DATA:  Abdominal discomfort.  History of duodenal carcinoma EXAM: PORTABLE ABDOMEN - 1 VIEW COMPARISON:  CT abdomen and pelvis Feb 25, 2018 FINDINGS: There are drains in the  pelvis. There is contrast in portions of colon. There is no appreciable bowel dilatation or air-fluid level to suggest bowel obstruction. No free air. Left lower quadrant ostomy noted. Lung bases are clear. IMPRESSION: Drains in pelvis. Contrast in portions of colon. No bowel dilatation or air-fluid level to suggest bowel obstruction. No free air. Left lower quadrant ostomy noted. Electronically Signed   By: Bretta Bang III M.D.   On: 03/06/2018 10:08   [place] Ekg Site Rite  Result Date: 02/26/2018 If Site Rite image not attached, placement could not be confirmed due to current cardiac rhythm.    CBC Recent Labs  Lab 03/01/18 0626 03/02/18 0507 03/03/18 0410 03/05/18 0648  WBC 18.9* 9.9 9.0 7.7  HGB 8.7* 7.6* 7.4* 8.9*  HCT 26.0* 22.0* 21.2* 26.9*  PLT 715* 505* 526* 511*  MCV 92.0 92.0 91.9 94.3  MCH 30.8 32.0 32.2 31.2  MCHC 33.4 34.7 35.0 33.1  RDW 15.5* 15.0* 14.7* 14.8*  LYMPHSABS  --   --  1.0  --   MONOABS  --   --  0.4  --   EOSABS  --   --  0.2  --   BASOSABS  --   --  0.1  --     Chemistries  Recent Labs  Lab 03/01/18 0626 03/02/18 0507 03/03/18 0410 03/05/18 0648  NA 131* 136 136 135  K 4.1 3.8 3.4* 3.5  CL 99* 99* 97* 98*  CO2 25 30 27 31   GLUCOSE 115* 93 82 101*  BUN <5* 6 10 11   CREATININE 0.47 0.49 0.49 0.58  CALCIUM 7.2* 7.1* 7.3* 7.2*  MG  --   --  1.8  --    ------------------------------------------------------------------------------------------------------------------ estimated creatinine clearance is 54.6 mL/min (by C-G formula based on SCr of 0.58 mg/dL). ------------------------------------------------------------------------------------------------------------------ No results for input(s): HGBA1C in the last 72 hours. ------------------------------------------------------------------------------------------------------------------ No results for input(s): CHOL, HDL, LDLCALC, TRIG, CHOLHDL, LDLDIRECT in the last 72  hours. ------------------------------------------------------------------------------------------------------------------ No results for input(s): TSH, T4TOTAL, T3FREE, THYROIDAB in the last 72 hours.  Invalid input(s): FREET3 ------------------------------------------------------------------------------------------------------------------ No results  for input(s): VITAMINB12, FOLATE, FERRITIN, TIBC, IRON, RETICCTPCT in the last 72 hours.  Coagulation profile No results for input(s): INR, PROTIME in the last 168 hours.  No results for input(s): DDIMER in the last 72 hours.  Cardiac Enzymes No results for input(s): CKMB, TROPONINI, MYOGLOBIN in the last 168 hours.  Invalid input(s): CK ------------------------------------------------------------------------------------------------------------------ Invalid input(s): POCBNP    Assessment & Plan  Patient is a 67 year old lady with history of COPD, chronic tobacco abuse status post surgery noted to have acute respiratory failure Patient is a 67 year old lady with history of COPD, chronic tobacco abuse status post surgery noted to have acute respiratory failure   1.  Acute hypoxic respiratory failure differential diagnosis based on the CT scan  Possible atypical pneumonia, ARDS Continue vancomycin and meropenem Echo of the heart shows no significant dysfunction Wean oxygen much as possible Appreciate intensivist input Repeat chest x-ray- shows improvement. Continue IV antibiotic  Pt seems to be able to come off Oxygen today. We can stop Antibiotics now, Unless it is needed for surgical reasons.  2.    Acute blood loss anemia follow hemoglobin- stable.    May need iron supplemental on discharge.  3.  Hypothyroidism continue Synthroid  4.  GERD continue Protonix  5.  Hyperlipidemia continue Crestor  6.  Nicotine abuse smoking cessation provided  7. Perforated viscus, s/p laparotomy, colostomy   Manage per surgical  team. No BM yet. On clear liq diet.   I will sign off, please call, if further help needed.     Code Status Orders  (From admission, onward)        Start     Ordered   02/25/18 1317  Full code  Continuous     02/25/18 1326    Code Status History    This patient has a current code status but no historical code status.    Advance Directive Documentation     Most Recent Value  Type of Advance Directive  Healthcare Power of Attorney, Living will  Pre-existing out of facility DNR order (yellow form or pink MOST form)  -  "MOST" Form in Place?  -       DVT Prophylaxis  Lovenox   Lab Results  Component Value Date   PLT 511 (H) 03/05/2018    Time Spent in minutes   35 minutes spent  Altamese Dilling M.D on 03/07/2018 at 2:05 PM  Between 7am to 6pm - Pager - [phone]  After 6pm go to [email or web address] - Social research officer, government  [employer]   Office  [phone]

## 2018-03-07 NOTE — Progress Notes (Signed)
Pharmacy Antibiotic Note  Annette Miller is a 67 y.o. female admitted on 02/25/2018 with pneumonia. Patient underwent ex lap with appendectomy, Hartmann sigmoidectomy, and colostomy on 5/31 before being transferred to ICU. Patient was previously taking zosyn post-op and has developed pulmonary edema, atelectasis, and possible pneumonia. Pharmacy has been consulted for meropenem and vancomycin dosing.  Plan: Continue meropenem IV 1 gram q8h.   Height: 5\' 2"  (157.5 cm) Weight: 110 lb 3.7 oz (50 kg) IBW/kg (Calculated) : 50.1  Temp (24hrs), Avg:98.7 F (37.1 C), Min:97.9 F (36.6 C), Max:99.1 F (37.3 C)  Recent Labs  Lab 03/01/18 0626 03/02/18 0507 03/03/18 0410 03/04/18 0924 03/05/18 0648  WBC 18.9* 9.9 9.0  --  7.7  CREATININE 0.47 0.49 0.49  --  0.58  VANCOTROUGH  --   --   --  7*  --     Estimated Creatinine Clearance: 54.6 mL/min (by C-G formula based on SCr of 0.58 mg/dL).    Allergies  Allergen Reactions  . Codeine Diarrhea and Nausea And Vomiting  . Prednisone Other (See Comments)    Reaction: violence    Antimicrobials this admission: Zosyn 5/31 >> 6/5 Vancomycin 6/4 >> 6/7 Meropenem 6/5 >>  Dose adjustments this admission: n/a  Microbiology results: 5/31 Abdomen fluid culture: rare e.coli (meropenem sensitive), rare p.aeruginosa (meropenem sensitive)  5/31 MRSA PCR: negative 6/4 MRSA PCA: negative  Thank you for allowing pharmacy to be a part of this patient's care.  Marty HeckWang, Zane Pellecchia L 03/07/2018 2:05 PM

## 2018-03-07 NOTE — Progress Notes (Signed)
   03/07/18 1515  Clinical Encounter Type  Visited With Patient;Family  Visit Type Follow-up (AD)  Referral From Other (Comment) (leadership rounding staff)  Consult/Referral To Development worker, international aidChaplain    Staff doing leadership rounds paged on-call chaplain to refer patient for AD.  Chaplain inquired with patient about AD and patient expressed interest.  Chaplain provided education and document.  Chaplain obtained witnesses and notary and AD was completed.  Original copy given to patient, copy in chart.

## 2018-03-07 NOTE — Progress Notes (Signed)
Patient says that she is hot.  I gave her the mag citrate that was ordered by the surgeon and patient was angry about having to drink it.  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  I left the room with instructions to drink the mag citrate

## 2018-03-07 NOTE — Consult Note (Signed)
WOC nurse in to see patient. No output. Patient and I discussed her need to change pouch. She wishes to change tomorrow because she has plans to DC home tomorrow.  [facility].  Loetta Connelley [facility] MSN,RN,CWOCN, CNS, CWON-AP [phone]

## 2018-03-07 NOTE — Care Management Important Message (Signed)
Important Message  Patient Details  Name: Annette Miller MRN: [id number] Date of Birth: 11/25/1950   Medicare Important Message Given:  Yes    Olegario MessierKathy A Lashanna Angelo 03/07/2018, 1:05 PM

## 2018-03-07 NOTE — Progress Notes (Signed)
Orders received from Dr Everlene FarrierPabon to discontinue telemetry and CBG's

## 2018-03-07 NOTE — Progress Notes (Signed)
CNA was attempting to walk the patient but when the patient stood up and took a few steps she started to fall sideways but the CNA was able to support her and assist her back to bed.

## 2018-03-08 LAB — COMPREHENSIVE METABOLIC PANEL
ALBUMIN: 1.6 g/dL — AB (ref 3.5–5.0)
ALK PHOS: 75 U/L (ref 38–126)
ALT: 10 U/L — ABNORMAL LOW (ref 14–54)
ANION GAP: 8 (ref 5–15)
AST: 18 U/L (ref 15–41)
BILIRUBIN TOTAL: 0.5 mg/dL (ref 0.3–1.2)
BUN: 8 mg/dL (ref 6–20)
CALCIUM: 7.5 mg/dL — AB (ref 8.9–10.3)
CO2: 31 mmol/L (ref 22–32)
Chloride: 94 mmol/L — ABNORMAL LOW (ref 101–111)
Creatinine, Ser: 0.55 mg/dL (ref 0.44–1.00)
GFR calc Af Amer: >60 mL/min (ref >60)
GFR calc non Af Amer: >60 mL/min (ref >60)
GLUCOSE: 85 mg/dL (ref 65–99)
Potassium: 3.9 mmol/L (ref 3.5–5.1)
Sodium: 133 mmol/L — ABNORMAL LOW (ref 135–145)
TOTAL PROTEIN: 5.3 g/dL — AB (ref 6.5–8.1)

## 2018-03-08 LAB — CBC
HEMATOCRIT: 22.8 % — AB (ref 35.0–47.0)
HEMOGLOBIN: 7.8 g/dL — AB (ref 12.0–16.0)
MCH: 32.2 pg (ref 26.0–34.0)
MCHC: 34.3 g/dL (ref 32.0–36.0)
MCV: 94 fL (ref 80.0–100.0)
Platelets: 719 10*3/uL — ABNORMAL HIGH (ref 150–440)
RBC: 2.42 MIL/uL — ABNORMAL LOW (ref 3.80–5.20)
RDW: 15.2 % — ABNORMAL HIGH (ref 11.5–14.5)
WBC: 6.1 10*3/uL (ref 3.6–11.0)

## 2018-03-08 MED ORDER — ENSURE ENLIVE PO LIQD: 237.0000 mL | Freq: Two times a day (BID) | ORAL | Status: DC

## 2018-03-08 NOTE — Consult Note (Signed)
WOC Nurse ostomy follow up Stoma type/location: LLQ, end colostomy Stomal assessment/size: 1 3/8" round, budded, pink and moist Peristomal assessment: intact  Treatment options for stomal/peristomal skin: Using 2" barrier ring to aid in seal Output none Ostomy pouching 2pc. 2 1/4"  Education provided:  Explained stoma characteristics (budded, flush, color, texture, care)  Patient performed pouch change (cutting new skin barrier, measuring stoma, cleaning peristomal skin and stoma, use of barrier ring) with minimal cueing.  Education on emptying when 1/3 to 1/2 full and how to empty  Provided patient with [employer] and marked items currently using  Answered patient/family questions:  Patient concerned about takedown surgery. I have explained that normally the surgeon will wait about 3-6 months for reversal once the patient is well healed and prepared for additional surgery.   Patient is not aware that she will have HHRN. May be beneficial because she is so weak and has not been moving much Banker(RN and PT)  WOC Nurse team will follow along with you for weekly wound assessments.  Please notify me of any acute changes in the wounds or any new areas of concerns Amedeo Detweiler [facility] MSN, RN,CWOCN, CNS, CWON-AP [phone]     Enrolled patient in [employer] Discharge program: Yes

## 2018-03-08 NOTE — Progress Notes (Addendum)
Physical Therapy Treatment Patient Details Name: Annette Miller MRN: [id number] DOB: 01-Oct-1950 Today's Date: 03/08/2018    History of Present Illness Pt is a 67 y.o. femae who presented to emergency room for acute onset of abdominal pain and admitted for diverticuliti of colon with perforation. Patient underwent laparotomy, Hartman's procedure, appendectomy and colostomy and was in ICU, now on 2C. Pt's PMH is significant for the following: COPD, allergy, depression, goiter, hyperlipidemia, hypothyroidism.     PT Comments    Pt in bed, ready to try session.  To edge of bed with increased time but no assist.  Stood and was able to ambulate 70' on room air with walker and min guard/assist.  Sats mid 90's after gait on room air.  Pt stated she almost fell yesterday with nursing due to weakness/dizziness but did not have any complaints or issues with gait today.  Limited by general fatigue and abdominal discomfort.  Overall tolerated well.  Pt stated she did not use walker prior to admission but agreed that she did need to use one for the time being.   Follow Up Recommendations  Home health PT;Supervision/Assistance - 24 hour     Equipment Recommendations  Rolling walker with 5" wheels    Recommendations for Other Services       Precautions / Restrictions Precautions Precautions: Fall;Other (comment) Restrictions Weight Bearing Restrictions: No    Mobility  Bed Mobility Overal bed mobility: Modified Independent                Transfers Overall transfer level: Needs assistance Equipment used: Rolling walker (2 wheeled) Transfers: Sit to/from Stand Sit to Stand: Supervision;Min guard            Ambulation/Gait Ambulation/Gait assistance: Supervision;Min guard Ambulation Distance (Feet): 70 Feet Assistive device: Rolling walker (2 wheeled) Gait Pattern/deviations: Step-through pattern;Decreased step length - right;Decreased step length - left;Trunk flexed   Gait  velocity interpretation: <1.8 ft/sec, indicate of risk for recurrent falls     Stairs             Wheelchair Mobility    Modified Rankin (Stroke Patients Only)       Balance Overall balance assessment: Needs assistance Sitting-balance support: Feet unsupported Sitting balance-Leahy Scale: Normal     Standing balance support: Bilateral upper extremity supported Standing balance-Leahy Scale: Fair Standing balance comment: relies on walker for support due to pain/discomfort in abdomen.                            Cognition Arousal/Alertness: Awake/alert Behavior During Therapy: WFL for tasks assessed/performed Overall Cognitive Status: Within Functional Limits for tasks assessed                                        Exercises      General Comments        Pertinent Vitals/Pain Pain Assessment: Faces Faces Pain Scale: Hurts even more Pain Location: abdominal after gait Pain Descriptors / Indicators: Grimacing;Guarding;Moaning Pain Intervention(s): Patient requesting pain meds-RN notified;Limited activity within patient's tolerance    Home Living                      Prior Function            PT Goals (current goals can now be found in the care plan section) Progress towards PT  goals: Progressing toward goals    Frequency    Min 2X/week      PT Plan Current plan remains appropriate    Co-evaluation              AM-PAC PT "6 Clicks" Daily Activity  Outcome Measure  Difficulty turning over in bed (including adjusting bedclothes, sheets and blankets)?: None Difficulty moving from lying on back to sitting on the side of the bed? : None Difficulty sitting down on and standing up from a chair with arms (e.g., wheelchair, bedside commode, etc,.)?: A Little Help needed moving to and from a bed to chair (including a wheelchair)?: A Little Help needed walking in hospital room?: A Little Help needed climbing 3-5  steps with a railing? : A Lot 6 Click Score: 19    End of Session Equipment Utilized During Treatment: Gait belt Activity Tolerance: Patient tolerated treatment well Patient left: in chair;with chair alarm set;with call bell/phone within reach Nurse Communication: Patient requests pain meds       Time: 1023-1034 PT Time Calculation (min) (ACUTE ONLY): 11 min  Charges:  $Gait Training: 8-22 mins                    G Codes:       Danielle Dess, PTA 03/08/18, 11:34 AM

## 2018-03-08 NOTE — Progress Notes (Signed)
S/p Hartmann's POD # 11 Hypoxemia responding to O2 and lasix Taking PO,  still weak, almost fell last night + flatus no real fecal matter Did not take mag citrate yesterday as ordered   PE NAD Abd: incision open wound healing secondary intention, no infection. Drains in place, serous fluid   A/p Doing well Wean off O2 continue miralax Regular diet Mobilize and PT Still too weak to DC , may benefit from PT at home DC in am

## 2018-03-08 NOTE — Progress Notes (Signed)
Nutrition Follow-up  DOCUMENTATION CODES:   Not applicable  INTERVENTION:   Add Ensure Enlive po BID, each supplement provides 350 kcal and 20 grams of protein  Recommend daily MVI  Recommend checking Mg and P labs as pt at moderate refeeding risk.   NUTRITION DIAGNOSIS:   Increased nutrient needs related to post-op healing as evidenced by increased estimated needs.  GOAL:   Patient will meet greater than or equal to 90% of their needs  Progressing.  MONITOR:   PO intake, Supplement acceptance, Diet advancement, Labs, Weight trends, Skin, I & O's  ASSESSMENT:   67 year old female with PMHx of depression, HLD, OP, hypothyroidism, goiter, anemia, arthritis who presented with abdominal pain, N/V found to have perforated diverticulitis s/p exploratory laparotomy, Hartmann sigmoidectomy and colostomy, and appendectomy on 5/31.   Pt with improved appetite and oral intake; advanced to regular diet 6/10. Pt ate 70% of her breakfast this morning. Will discontinue Boost Breeze and add Ensure to help pt meet her estimated needs. No new weight in chart since 6/4; recommend obtain new weight. Pt with little output in ostomy bag but is passing flatus. Miralax started 6/10. Recommend checking Mg and P labs as pt at moderate refeeding risk.   Medications reviewed and include: lovenox, synthroid, miralax, meropenem, oxycodone  Labs reviewed: Na 133(L), K 3.9 wnl, Ca 7.5(L) adj. 9.42 wnl, alb 1.6(L) Hgb 7.8(L), Hct 22.8(L)  Diet Order:   Diet Order           Diet regular Room service appropriate? Yes; Fluid consistency: Thin  Diet effective now         EDUCATION NEEDS:   No education needs have been identified at this time  Skin:  Incisions: closed incision to abdomen  Last BM:  02/26/2018 - small type 7 per colostomy (per Surgeon note that day the output was a clear liquid so not a true BM); no further colostomy output has been documented  Height:   Ht Readings from Last 1  Encounters:  03/01/18 5\' 2"  (1.575 m)    Weight:   Wt Readings from Last 1 Encounters:  03/01/18 110 lb 3.7 oz (50 kg)    Ideal Body Weight:  50 kg  BMI:  Body mass index is 20.16 kg/m.  Estimated Nutritional Needs:   Kcal:  1500-1750 kcal (30-35 kcal/kg)  Protein:  75-85 grams (1.5-1.7 grams/kg)  Fluid:  1.5-1.75 L/day (30-35 mL/kg)  Annette Holidayasey Mizael Sagar MS, RD, LDN Pager #- [phone] Office#- [phone] After Hours Pager: [phone]

## 2018-03-08 NOTE — Progress Notes (Signed)
Foley removed per order, pt is due to void by midnight 03/09/2018. Left JP drain removed per order, no signs of bleeding. Will continue to monitor pt.   Edem Tiegs [place]

## 2018-03-09 NOTE — Care Management (Addendum)
PT has assessed patient and and recommended home health PT.  Patient agreeable to services and does not have a preference for agency.  [facility] Patient will need PT, and a RW, along with RN for ostomy care and dressing changes.  RNCM following

## 2018-03-09 NOTE — Progress Notes (Signed)
PT Cancellation Note  Patient Details Name: Annette Miller MRN: [id number] DOB: Apr 19, 1951   Cancelled Treatment:    Reason Eval/Treat Not Completed: Pain limiting ability to participate. Pt reports having increased pain throughout entire abdomen today, after drains were removed; pt requesting pain medication and wishes to defer PT at this time. Requests re attempt either later today or tomorrow.    Scot DockHeidi E Barnes, PTA 03/09/2018, 12:45 PM

## 2018-03-09 NOTE — Progress Notes (Addendum)
SURGICAL PROGRESS NOTE    Annette Mattocksamela S Miller  ZOX:[id number] DOB: 1951/09/25 DOA: 02/25/2018  History: The patient is a 67 year-old female with a history of COPD who is POD #12 from a Hartmann's procedure for perforated diverticulitis with feculent peritonitis. Her hospital course was complicated by respiratory insufficiency requiring BIPAP. She is now comfortable on room air.  Subjective: The patient denies abdominal pain. She denies nausea, vomiting and diarrhea. She denies stool output from ostomy although she does note gas from ostomy. She denies fever and chills.   Objective: Vitals:   03/08/18 1204 03/08/18 2105 03/09/18 0352 03/09/18 1321  BP: 130/79 (!) 144/77 119/66 129/60  Pulse: (!) 103 100 99 100  Resp: 18  18 15   Temp: 98.4 F (36.9 C) 98.5 F (36.9 C) 99 F (37.2 C) 98.1 F (36.7 C)  TempSrc: Oral Oral Oral Oral  SpO2: 90% 96% 93% 94%  Weight:      Height:        Examination:  General exam: Appears calm and comfortable  Respiratory system: Respiratory effort normal. Cardiovascular system: RRR. No pedal edema. Gastrointestinal system: Abdomen is nondistended, soft and mildly tender around abdominal incision. Ostomy is pink and viable with gas in bag. No stool from ostomy. Midline abdominal skin is open and healing by secondary intention with good granulation tissue. Central nervous system: Alert and oriented. No focal neurological deficits. Extremities: No calf pain or swelling. Skin: No rashes, lesions or ulcers Psychiatry: Judgement and insight appear normal. Mood & affect appropriate.   Data Reviewed: I have personally reviewed following labs and imaging studies  CBC: Recent Labs  Lab 03/03/18 0410 03/05/18 0648 03/08/18 0522  WBC 9.0 7.7 6.1  NEUTROABS 7.2*  --   --   HGB 7.4* 8.9* 7.8*  HCT 21.2* 26.9* 22.8*  MCV 91.9 94.3 94.0  PLT 526* 511* 719*   Basic Metabolic Panel: Recent Labs  Lab 03/03/18 0410 03/05/18 0648 03/08/18 0522  NA 136 135  133*  K 3.4* 3.5 3.9  CL 97* 98* 94*  CO2 27 31 31   GLUCOSE 82 101* 85  BUN 10 11 8   CREATININE 0.49 0.58 0.55  CALCIUM 7.3* 7.2* 7.5*  MG 1.8  --   --    Liver Function Tests: Recent Labs  Lab 03/08/18 0522  AST 18  ALT 10*  ALKPHOS 75  BILITOT 0.5  PROT 5.3*  ALBUMIN 1.6*   CBG: Recent Labs  Lab 03/06/18 0548 03/06/18 1157 03/06/18 2341 03/07/18 0659 03/07/18 1147  GLUCAP 112* 107* 118* 133* 106*    Scheduled Meds: . enoxaparin (LOVENOX) injection  40 mg Subcutaneous Q24H  . feeding supplement (ENSURE ENLIVE)  237 mL Oral BID BM  . levothyroxine  25 mcg Oral QAC breakfast  . mouth rinse  15 mL Mouth Rinse BID  . polyethylene glycol  17 g Oral BID     LOS: 12 days    Assessment/Plan: The patient is a 67 year-old female with a history of COPD who is POD #12 from a Hartmann's procedure for perforated diverticulitis with feculent peritonitis. Her hospital course was complicated by respiratory insufficiency requiring BIPAP. She is now comfortable on room air.  - Continue wet-to-dry dressing to abdominal wound - Continue to monitor for ostomy output - Continue miralax, consider enema through ostomy - Continue PT and ambulation - Discontinue antibiotics - Continue home synthroid - Continue DVT prophylaxis - Walker ordered for home - Likely discharge tomorrow with home PT and home wound/ostomy care  which is being arranged.  Foley removed yesterday, patient voiding well. Remaining drain removed today.   Star Age, DO

## 2018-03-09 NOTE — Care Management Important Message (Signed)
Copy of signed IM left with patient in room.  

## 2018-03-09 NOTE — Progress Notes (Signed)
Pt. has two episodes of nausea at this time. I told patient that I am concerned about it, since her colostomy bag has not been functioning related to no output. Patient is not open to suggestion and education with her colostomy bag, function and possible complication of post surgery.  Pt said " she does not feel good" due to surgical incision. 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

## 2018-03-10 MED ORDER — BOOST / RESOURCE BREEZE PO LIQD CUSTOM
1.0000 | Freq: Three times a day (TID) | ORAL | Status: DC
  Administered 2018-03-10 – 2018-03-11 (×4): 1 via ORAL

## 2018-03-10 MED ORDER — MAGNESIUM HYDROXIDE 400 MG/5ML PO SUSP
30.0000 mL | Freq: Once | ORAL | Status: AC
  Administered 2018-03-10: 30 mL via ORAL
  Filled 2018-03-10: qty 30

## 2018-03-10 MED ORDER — BISACODYL 10 MG RE SUPP
10.0000 mg | Freq: Once | RECTAL | Status: AC
  Administered 2018-03-10: 10 mg via RECTAL
  Filled 2018-03-10: qty 1

## 2018-03-10 NOTE — Consult Note (Signed)
WOC Nurse ostomy follow up In to see patient today just for check in. Her pouch is intact and she has had no output since surgery. Surgeon has ordered MOM and she is receiving her second suppository upon my arrival to the room  Stoma type/location: LLQ, end colostomy Stomal assessment/size: 1 3/8" round, budded, pink, and moist Peristomal assessment: NA Treatment options for stomal/peristomal skin: Using 2" barrier ring to protect peristomal skin and aid in seal Output none Ostomy pouching: 2pc. 2 1/4" in place, will plan to have patient change in the morning with WOC nurse oversight Education provided: discussed importance of eating protein for wound healing. She has lost 28 lbs per patient report over last 6 months. Her nurse is bringing in a Nurse, [employer] Breeze for her today Enrolled patient in [employer] Dis[employer] program: Yes  Bedside nurse was changing midline abdominal wound and it was noted that she has significant undermining that measures 7.5cm from 3-8 o'clock that has not been filled with gauze.  I have packed this area and explained rationale for packing this area along with tunneled area at the proximal end of the wound just above the umbilicus.  Updated wound care orders to make sure nursing staff are aware of the undermining and need to fill this space with moist gauze..   WOC Nurse will follow along with you for continued support with ostomy teaching and care Tyliah Schlereth [facility] MSN, RN, [place], CNS, [place] [phone]

## 2018-03-10 NOTE — Progress Notes (Signed)
Physical Therapy Treatment Patient Details Name: Annette Miller S Cozzens MRN: [id number] DOB: 07-13-1951 Today's Date: 03/10/2018    History of Present Illness Pt is a 67 y.o. femae who presented to emergency room for acute onset of abdominal pain and admitted for diverticuliti of colon with perforation. Patient underwent laparotomy, Hartman's procedure, appendectomy and colostomy and was in ICU, now on 2C. Pt's PMH is significant for the following: COPD, allergy, depression, goiter, hyperlipidemia, hypothyroidism.     PT Comments    Pt initially refuses PT due to pain/fatigue, but with gentle encouragement agreeable to limited supine exercises. Pt educated in supine bed exercises including abdominal bracing and seated FAQ (verbally only) for technique and frequency with written HEP provided. Pt requesting pain medication at this time. Continue PT to progress participation with out of bed activity, strength and endurance to allow pt an optimal safe return home.   Follow Up Recommendations  Home health PT;Supervision/Assistance - 24 hour     Equipment Recommendations  Rolling walker with 5" wheels    Recommendations for Other Services       Precautions / Restrictions Precautions Precautions: Fall;Other (comment) Restrictions Weight Bearing Restrictions: No    Mobility  Bed Mobility               General bed mobility comments: Not tested; pt declines noting she was up earlier today  Transfers                    Ambulation/Gait                 Stairs             Wheelchair Mobility    Modified Rankin (Stroke Patients Only)       Balance                                            Cognition Arousal/Alertness: Awake/alert Behavior During Therapy: WFL for tasks assessed/performed Overall Cognitive Status: Within Functional Limits for tasks assessed                                        Exercises General  Exercises - Lower Extremity Ankle Circles/Pumps: AROM;Both;10 reps;Supine Quad Sets: Strengthening;Both;10 reps;Supine Gluteal Sets: Strengthening;Both;10 reps;Supine Short Arc Quad: AROM;Both;10 reps;Supine Long Arc Quad: Other (comment)(Reviewed verbally) Heel Slides: AROM;Both;10 reps;Supine Hip ABduction/ADduction: AAROM;Both;10 reps;Supine(assist only to hold LE off bed) Straight Leg Raises: (defer at this time) Other Exercises Other Exercises: abdominal bracing 10x     General Comments        Pertinent Vitals/Pain Pain Assessment: 0-10 Pain Score: 8  Pain Location: Abdomen Pain Descriptors / Indicators: Constant;Aching;Discomfort Pain Intervention(s): Patient requesting pain meds-RN notified;Limited activity within patient's tolerance    Home Living                      Prior Function            PT Goals (current goals can now be found in the care plan section) Progress towards PT goals: Progressing toward goals(slowly; needs to walk)    Frequency    Min 2X/week      PT Plan Current plan remains appropriate    Co-evaluation  AM-PAC PT "6 Clicks" Daily Activity  Outcome Measure  Difficulty turning over in bed (including adjusting bedclothes, sheets and blankets)?: A Little Difficulty moving from lying on back to sitting on the side of the bed? : Unable Difficulty sitting down on and standing up from a chair with arms (e.g., wheelchair, bedside commode, etc,.)?: Unable Help needed moving to and from a bed to chair (including a wheelchair)?: A Little Help needed walking in hospital room?: A Little Help needed climbing 3-5 steps with a railing? : A Lot 6 Click Score: 13    End of Session   Activity Tolerance: Patient tolerated treatment well Patient left: in bed;with call bell/phone within reach;with bed alarm set   PT Visit Diagnosis: Other abnormalities of gait and mobility (R26.89);Muscle weakness (generalized)  (M62.81);History of falling (Z91.81)     Time: 1610-9604 PT Time Calculation (min) (ACUTE ONLY): 23 min  Charges:  $Therapeutic Exercise: 23-37 mins                    G Codes:        Scot Dock, PTA 03/10/2018, 4:58 PM

## 2018-03-10 NOTE — Progress Notes (Signed)
13 Days Post-Op  Subjective: Status post Hartman's procedure.  No ostomy output yet.  Patient has no complaints and is tolerating a diet.  Objective: Vital signs in last 24 hours: Temp:  [98.1 F (36.7 C)-99 F (37.2 C)] 99 F (37.2 C) (06/13 0450) Pulse Rate:  [97-100] 98 (06/13 0450) Resp:  [15-18] 18 (06/13 0450) BP: (129-141)/(60-72) 139/67 (06/13 0450) SpO2:  [90 %-95 %] 90 % (06/13 0450) Last BM Date: (unsure, prior to surgery)  Intake/Output from previous day: 06/12 0701 - 06/13 0700 In: 280 [P.O.:280] Out: 300 [Urine:300] Intake/Output this shift: Total I/O In: -  Out: 150 [Urine:150]  Physical exam:  Abdomen soft nondistended nontympanitic and nontender wound is granulating ostomy is pink but nonfunctional  Lab Results: CBC  Recent Labs    03/08/18 0522  WBC 6.1  HGB 7.8*  HCT 22.8*  PLT 719*   BMET Recent Labs    03/08/18 0522  NA 133*  K 3.9  CL 94*  CO2 31  GLUCOSE 85  BUN 8  CREATININE 0.55  CALCIUM 7.5*   PT/INR No results for input(s): LABPROT, INR in the last 72 hours. ABG No results for input(s): PHART, HCO3 in the last 72 hours.  Invalid input(s): PCO2, PO2  Studies/Results: No results found.  Anti-infectives: Anti-infectives (From admission, onward)   Start     Dose/Rate Route Frequency Ordered Stop   03/02/18 1500  meropenem (MERREM) 1 g in sodium chloride 0.9 % 100 mL IVPB  Status:  Discontinued     1 g 200 mL/hr over 30 Minutes Intravenous Every 12 hours 03/02/18 1149 03/02/18 1151   03/02/18 1400  meropenem (MERREM) 1 g in sodium chloride 0.9 % 100 mL IVPB  Status:  Discontinued     1 g 200 mL/hr over 30 Minutes Intravenous Every 8 hours 03/02/18 1151 03/09/18 1100   03/02/18 0400  vancomycin (VANCOCIN) IVPB 750 mg/150 ml premix  Status:  Discontinued     750 mg 150 mL/hr over 60 Minutes Intravenous Every 18 hours 03/01/18 1014 03/04/18 1028   03/01/18 1015  vancomycin (VANCOCIN) IVPB 1000 mg/200 mL premix     1,000  mg 200 mL/hr over 60 Minutes Intravenous  Once 03/01/18 1012 03/01/18 1206   02/25/18 1600  piperacillin-tazobactam (ZOSYN) IVPB 3.375 g  Status:  Discontinued     3.375 g 12.5 mL/hr over 240 Minutes Intravenous Every 8 hours 02/25/18 1326 03/02/18 1037   02/25/18 0645  vancomycin (VANCOCIN) IVPB 1000 mg/200 mL premix     1,000 mg 200 mL/hr over 60 Minutes Intravenous  Once 02/25/18 0639 02/25/18 0814   02/25/18 0630  piperacillin-tazobactam (ZOSYN) IVPB 3.375 g     3.375 g 100 mL/hr over 30 Minutes Intravenous  Once 02/25/18 0619 02/25/18 0715   02/25/18 0630  vancomycin (VANCOCIN) injection 1 g  Status:  Discontinued     1 g Intravenous  Once 02/25/18 0619 02/25/18 0639      Assessment/Plan: s/p Procedure(s): EXPLORATORY LAPAROTOMY, PARTMAN'S PROCEDURE, APPENDECTOMY, COLOSTOMY   Open wound.  Dressings intact.  Ostomy not yet functional.  Dr. Arlyce DiceKaplan had suggested the patient could go home possibly today but I believe that without demonstration of function of the colostomy bag that she could have difficulty changing the colostomy and we have yet to demonstrate that it is functional.  For that reason I recommend she stay in the hospital she was in understanding of this and I discussed with social services as well.  Lattie Hawichard E Hila Bolding, MD,  FACS  03/10/2018

## 2018-03-11 MED ORDER — KETOROLAC TROMETHAMINE 15 MG/ML IJ SOLN
15.0000 mg | Freq: Four times a day (QID) | INTRAMUSCULAR | Status: DC
  Administered 2018-03-11 – 2018-03-12 (×4): 15 mg via INTRAVENOUS
  Filled 2018-03-11 (×4): qty 1

## 2018-03-11 MED ORDER — ACETAMINOPHEN 500 MG PO TABS
1000.0000 mg | ORAL_TABLET | Freq: Four times a day (QID) | ORAL | Status: DC
  Administered 2018-03-11 – 2018-03-12 (×4): 1000 mg via ORAL
  Filled 2018-03-11 (×4): qty 2

## 2018-03-11 MED ORDER — OXYCODONE HCL 5 MG PO TABS
5.0000 mg | ORAL_TABLET | ORAL | Status: DC | PRN
  Administered 2018-03-11 – 2018-03-12 (×2): 5 mg via ORAL
  Filled 2018-03-11 (×2): qty 1

## 2018-03-11 MED ORDER — DOCUSATE SODIUM 100 MG PO CAPS
100.0000 mg | ORAL_CAPSULE | Freq: Every day | ORAL | Status: DC
  Administered 2018-03-11 – 2018-03-12 (×2): 100 mg via ORAL
  Filled 2018-03-11 (×2): qty 1

## 2018-03-11 NOTE — Consult Note (Signed)
WOC Nurse ostomy follow up Stoma type/location: LLQ, end colostomy Stomal assessment/size: 1 1/4" round, pink, budded stoma Peristomal assessment: intact  Treatment options for stomal/peristomal skin: Using 2" barrier ring to aid in seal Output dark green, minimal stool, +flatus Ostomy pouching: 2pc. 2 1/4" with 2" barrier ring Education provided:  Patient is not feeling well today. She has requested that I change her pouch. She has performed pouch change with minimal cuing this week. Today she puts a tissue over her nose when I prepare to change pouch. Stated "it smells" however the pouch is intact with no odor what so ever. After removal of the pouch she has face and nose covered with tissues. She request that I "spray something before I vomit".   Changed pouching system, date placed on skin barrier. Extra supplies placed in the patient's room for use and for DC if needed.  [place] [employer] Dis[place]: Yes  WOC Nurse will follow along with you for continued support with ostomy teaching and care Alysen Smylie [facility] MSN, RN, [place], CNS, [place] [phone]

## 2018-03-11 NOTE — Progress Notes (Signed)
   03/11/18 1117  Clinical Encounter Type  Visited With Patient  Visit Type Follow-up   Chaplain asked patient if this was a good time for a visit and she said it would be fine.  Patient said she will be discharged soon, maybe today, but she is concerned about the assistance she will need upon returning home because she lives on the third floor and cannot easily walk up that many stairs.  She was in good spirits other than her anxiety of what will happen upon discharge and the possibility of needing home health care and help from relatives.  Chaplain provided emotional support and listened to her concerns.

## 2018-03-11 NOTE — Care Management (Signed)
[facility]

## 2018-03-11 NOTE — Progress Notes (Signed)
SURGICAL PROGRESS NOTE  Hospital Day(s): 14.   Post op day(s): 14 Days Post-Op.   Interval History: Patient seen and examined, no acute events or new complaints overnight. Patient reports stool from colostomy beginning and continuing since this morning with still tolerating regular diet and ambulation with rolling walker, through she reports nausea with narcotic pain medications. She otherwise states her pain has been well-controlled, denies fever/chills, CP, or SOB.  Review of Systems:  Constitutional: denies fever, chills  Respiratory: denies any shortness of breath  Cardiovascular: denies chest pain or palpitations  Gastrointestinal: abdominal pain, N/V, and bowel function as per interval history Musculoskeletal: denies pain, decreased motor or sensation Integumentary: denies any other rashes or skin discolorations except post-surgical wounds  Vital signs in last 24 hours: [min-max] current  Temp:  [98.2 F (36.8 C)-98.7 F (37.1 C)] 98.6 F (37 C) (06/14 0447) Pulse Rate:  [73-98] 90 (06/14 0447) Resp:  [18-20] 18 (06/14 0447) BP: (118-146)/(59-66) 119/61 (06/14 0447) SpO2:  [90 %-98 %] 98 % (06/14 0447)     Height: 5\' 2"  (157.5 cm) Weight: 110 lb 3.7 oz (50 kg) BMI (Calculated): 20.16   Intake/Output this shift:  No intake/output data recorded.   Intake/Output last 2 shifts:  @IOLAST2SHIFTS @   Physical Exam:  Constitutional: alert, cooperative and no distress  Respiratory: breathing non-labored at rest  Cardiovascular: regular rate and sinus rhythm  Gastrointestinal: soft, non-tender, and non-distended, pink and viable colostomy with moderate amount of loose feces in her ostomy bag, wound open with clean base  Labs:  CBC Latest Ref Rng & Units 03/08/2018 03/05/2018 03/03/2018  WBC 3.6 - 11.0 K/uL 6.1 7.7 9.0  Hemoglobin 12.0 - 16.0 g/dL 7.8(L) 8.9(L) 7.4(L)  Hematocrit 35.0 - 47.0 % 22.8(L) 26.9(L) 21.2(L)  Platelets 150 - 440 K/uL 719(H) 511(H) 526(H)   CMP Latest  Ref Rng & Units 03/08/2018 03/05/2018 03/03/2018  Glucose 65 - 99 mg/dL 85 161(W101(H) 82  BUN 6 - 20 mg/dL 8 11 10   Creatinine 0.44 - 1.00 mg/dL 9.600.55 4.540.58 0.980.49  Sodium 135 - 145 mmol/L 133(L) 135 136  Potassium 3.5 - 5.1 mmol/L 3.9 3.5 3.4(L)  Chloride 101 - 111 mmol/L 94(L) 98(L) 97(L)  CO2 22 - 32 mmol/L 31 31 27   Calcium 8.9 - 10.3 mg/dL 7.5(L) 7.2(L) 7.3(L)  Total Protein 6.5 - 8.1 g/dL 5.3(L) - -  Total Bilirubin 0.3 - 1.2 mg/dL 0.5 - -  Alkaline Phos 38 - 126 U/L 75 - -  AST 15 - 41 U/L 18 - -  ALT 14 - 54 U/L 10(L) - -   Imaging studies: No new pertinent imaging studies   Assessment/Plan: 67 y.o. female with resolving prolonged post-surgical ileus and resolved post-surgical respiratory difficulties requiring return to ICU for such early this admission, now 14 Days Post-Op s/p Hartmann's partial colectomy with end-colostomy and rectal stumpfor perforated sigmoid colonic diverticulitis with feculent peritonitis and sepsis, complicated by pertinent comorbidities includingchronicseveremalnutrition, anemia, HLD, hypothyroidism, osteoarthritis, and major depression disorder.   - continue regular diet, discussed with RN teaching for ostomy care  - will adjust pain control regimen to all oral medications and reduce narcotics   - medical management of comorbidities as per medical team   - DVT prophylaxis with ambulation encouraged  - discharge planning, discussed with CM   All of the above findings and recommendations were discussed with the patient and medical team, and all of patient's questions were answered to her expressed satisfaction.  -- Scherrie GerlachJason E. Earlene Plateravis, MD, RPVI  [place]: [facility] - Partnering for exceptional care. Office: [phone]

## 2018-03-11 NOTE — Care Management Important Message (Signed)
Copy of signed IM left with patient in room.  

## 2018-03-12 MED ORDER — OXYCODONE HCL 5 MG PO TABS: 5.0000 mg | ORAL_TABLET | ORAL | 0 refills | Status: DC | PRN

## 2018-03-12 NOTE — Progress Notes (Signed)
BIPAP not needed at this time 

## 2018-03-12 NOTE — Progress Notes (Signed)
Nsg Discharge Note  Admit Date:  02/25/2018 Discharge date: 03/12/2018   Annette Miller to be D/C'd home per MD order.  AVS completed.  Copy for chart, and copy for patient signed, and dated. Patient/caregiver able to verbalize understanding.  Discharge Medication: Allergies as of 03/12/2018      Reactions   Codeine Diarrhea, Nausea And Vomiting   Prednisone Other (See Comments)   Reaction: violence      Medication List    TAKE these medications   acetaminophen 325 MG tablet Commonly known as:  TYLENOL Take 650 mg by mouth every 6 (six) hours as needed for mild pain or fever.   buPROPion 150 MG 12 hr tablet Commonly known as:  WELLBUTRIN SR TAKE 1 TABLET (150 MG TOTAL) BY MOUTH 2 (TWO) TIMES DAILY. What changed:  when to take this   diazepam 5 MG tablet Commonly known as:  VALIUM TAKE 1 TABLET BY MOUTH TWICE A DAY   fluticasone 50 MCG/ACT nasal spray Commonly known as:  FLONASE Place 2 sprays into both nostrils daily.   levothyroxine 25 MCG tablet Commonly known as:  SYNTHROID, LEVOTHROID Take 1 tablet (25 mcg total) by mouth daily.   loratadine 10 MG tablet Commonly known as:  CLARITIN Take 1 tablet (10 mg total) by mouth daily.   magnesium 30 MG tablet Take 30 mg by mouth daily.   meloxicam 15 MG tablet Commonly known as:  MOBIC Take 1 tablet (15 mg total) by mouth daily.   ondansetron 4 MG disintegrating tablet Commonly known as:  ZOFRAN ODT Take 1 tablet (4 mg total) by mouth every 8 (eight) hours as needed for nausea or vomiting.   ondansetron 4 MG tablet Commonly known as:  ZOFRAN TAKE 1 TABLET BY MOUTH EVERY 8 HOURS AS NEEDED FOR NAUSEA AND VOMITING What changed:  See the new instructions.   oxyCODONE 5 MG immediate release tablet Commonly known as:  Oxy IR/ROXICODONE Take 1 tablet (5 mg total) by mouth every 4 (four) hours as needed for severe pain.   OYSTER SHELL CALCIUM PLUS D PO Take 1 tablet by mouth daily.   pantoprazole 40 MG  tablet Commonly known as:  PROTONIX Take 1 tablet (40 mg total) by mouth 2 (two) times daily.   potassium chloride 10 MEQ tablet Commonly known as:  K-DUR Take 4 tablets (40 mEq total) by mouth daily for 2 days.   rosuvastatin 10 MG tablet Commonly known as:  CRESTOR Take 1 tablet (10 mg total) by mouth daily.   Selenium 200 MCG Caps Take by mouth daily.   sucralfate 1 g tablet Commonly known as:  CARAFATE Take 1 tablet (1 g total) by mouth 2 (two) times daily.   vitamin C 1000 MG tablet Take 3,000 mg by mouth daily.   vitamin E 1000 UNIT capsule Take 1,000 Units by mouth 2 (two) times daily.   ZINC 15 PO Take by mouth daily.            Durable Medical Equipment  (From admission, onward)        Start     Ordered   03/09/18 1102  For home use only DME Walker rolling  Once    Question:  Patient needs a walker to treat with the following condition  Answer:  Ambulatory dysfunction   03/09/18 1102      Discharge Assessment: Vitals:   03/11/18 1941 03/12/18 0442  BP: (!) 115/55 (!) 97/55  Pulse: 96 (!) 103  Resp: 16  16  Temp: 97.9 F (36.6 C) 98.6 F (37 C)  SpO2: 93% 96%   Skin clean, dry and intact without evidence of skin break down, no evidence of skin tears noted. IV catheter discontinued intact. Site without signs and symptoms of complications - no redness or edema noted at insertion site, patient denies c/o pain - only slight tenderness at site.  Dressing with slight pressure applied.  D/c Instructions-Education: Discharge instructions given to patient/family with verbalized understanding. D/c education completed with patient/family including follow up instructions, medication list, d/c activities limitations if indicated, with other d/c instructions as indicated by MD - patient able to verbalize understanding, all questions fully answered. Patient instructed to return to ED, call 911, or call MD for any changes in condition.  Patient escorted via WC,  and D/C home via private auto with son and brother  Annette Laundrylizabeth A Koston Hennes, RN 03/12/2018 1:37 PM

## 2018-03-12 NOTE — Care Management (Signed)
Patient to be discharged per Md order. Patient has HHPT orders. Choice provided and previously selected [facility]. Referral placed with Jermaine through [facility]. Walker already delievered. Ostomy supplies for discharge at bedside and per pt WOC informed her there were plans for delivery of more products pending. Patient states she is able to get up the stairs that are at her house. Son will be available for most of the day and can assist with patients needs.  Buddy DutyJosh Broady Lafoy RN BSN RNCM [phone]

## 2018-03-12 NOTE — Progress Notes (Signed)
Nurse attempted to perform dressing change and patient stated " let's wait until I'm awake I'm really tired". Will attempt to change dressing prior to shift change and if patient not prepared will notify day shift nurse that dressing not changed.

## 2018-03-14 ENCOUNTER — Encounter

## 2018-03-14 ENCOUNTER — Telehealth: Payer: Self-pay

## 2018-03-14 ENCOUNTER — Ambulatory Visit: Payer: Medicare Other | Admitting: Family Medicine

## 2018-03-14 DIAGNOSIS — M810 Age-related osteoporosis without current pathological fracture: Secondary | ICD-10-CM | POA: Diagnosis not present

## 2018-03-14 DIAGNOSIS — J449 Chronic obstructive pulmonary disease, unspecified: Secondary | ICD-10-CM | POA: Diagnosis not present

## 2018-03-14 DIAGNOSIS — M1990 Unspecified osteoarthritis, unspecified site: Secondary | ICD-10-CM | POA: Diagnosis not present

## 2018-03-14 DIAGNOSIS — K435 Parastomal hernia without obstruction or  gangrene: Secondary | ICD-10-CM | POA: Diagnosis not present

## 2018-03-14 DIAGNOSIS — E785 Hyperlipidemia, unspecified: Secondary | ICD-10-CM | POA: Diagnosis not present

## 2018-03-14 DIAGNOSIS — K5720 Diverticulitis of large intestine with perforation and abscess without bleeding: Secondary | ICD-10-CM | POA: Diagnosis not present

## 2018-03-14 DIAGNOSIS — D649 Anemia, unspecified: Secondary | ICD-10-CM | POA: Diagnosis not present

## 2018-03-14 DIAGNOSIS — E039 Hypothyroidism, unspecified: Secondary | ICD-10-CM | POA: Diagnosis not present

## 2018-03-14 DIAGNOSIS — Z433 Encounter for attention to colostomy: Secondary | ICD-10-CM | POA: Diagnosis not present

## 2018-03-14 DIAGNOSIS — N390 Urinary tract infection, site not specified: Secondary | ICD-10-CM | POA: Diagnosis not present

## 2018-03-14 DIAGNOSIS — K260 Acute duodenal ulcer with hemorrhage: Secondary | ICD-10-CM | POA: Diagnosis not present

## 2018-03-14 DIAGNOSIS — Z48815 Encounter for surgical aftercare following surgery on the digestive system: Secondary | ICD-10-CM | POA: Diagnosis not present

## 2018-03-14 DIAGNOSIS — K439 Ventral hernia without obstruction or gangrene: Secondary | ICD-10-CM | POA: Diagnosis not present

## 2018-03-14 DIAGNOSIS — K219 Gastro-esophageal reflux disease without esophagitis: Secondary | ICD-10-CM | POA: Diagnosis not present

## 2018-03-14 NOTE — Telephone Encounter (Signed)
Verbal [facility] for home care [facility]

## 2018-03-14 NOTE — Telephone Encounter (Signed)
Transition Care Management Follow-up Telephone Call  How have you been since you were released from the hospital? "in pain right now, just took pain medicine and home health just left"  Do you understand why you were in the hospital? yes  Do you have a copy of your discharge instructions Yes Do you understand the discharge instrcutions? yes  Where were you discharged to? Home  Do you have support at home? Yes    Items Reviewed:  Medications obtained Yes  Medications reviewed: Yes  Dietary changes reviewed: yes  Home Health? Yes, Agency: [facility]  DME ordered at discharge obtained? Yes  Medical supplies: Wound Care, ostomy supplies    Functional Questionnaire:   Activities of Daily Living (ADLs):   She states they are independent in the following: feeding, continence, grooming and toileting States they require assistance with the following: ambulation, bathing and hygiene, dressing and medication management  Any transportation issues/concerns?: no  Any patient concerns? Yes, needs zofran refilled, will send message to pcp  Confirmed importance and date/time of follow-up visits scheduled with PCP: yes, scheduled for earliest appt with Dr.Crissman on 03/29/2018 at 2:30pm. Patient didn't want an appt with another provider and wanted it at least a week from now  Confirm appointment scheduled with specialist? Yes  Confirmed with patient if condition begins to worsen call PCP or If it's emergency go to the ER.

## 2018-03-15 ENCOUNTER — Telehealth: Payer: Self-pay

## 2018-03-15 NOTE — Telephone Encounter (Signed)
Flagged on EMMI report for having unfilled prescriptions. First attempt to reach patient made 03/15/18 at 11:06am, however unable to reach patient.  Left voicemail encouraging callback. Will attempt at later time.

## 2018-03-16 NOTE — Telephone Encounter (Signed)
Second attempt to reach patient made 03/16/18 at 11:10am, however unable to reach patient.  Left another voicemail encouraging callback should she have any questions or concerns with her recent hospital stay.

## 2018-03-21 ENCOUNTER — Telehealth: Payer: Self-pay | Admitting: Surgery

## 2018-03-21 NOTE — Telephone Encounter (Signed)
Amy with [facility] is calling in regards of the patient. She is needing to put a request in for the patients abdominal wound, would like to see if the can get an order put in. Please call Amy and advise [phone]

## 2018-03-22 ENCOUNTER — Encounter: Payer: Self-pay | Admitting: Family Medicine

## 2018-03-22 ENCOUNTER — Other Ambulatory Visit: Payer: Self-pay | Admitting: Surgery

## 2018-03-22 NOTE — Telephone Encounter (Signed)
Patient would like someone to call her about a refill on her medication. Please advise

## 2018-03-22 NOTE — Telephone Encounter (Signed)
Patient needs to be seen in office prior to refilling medication and or orders for wound vac.   Spoke with patient and she will check with her son's schedule to see if he can bring her to be seen 03/23/18. No appointment made at this time, however if patient calls back tomorrow morning please add to schedule-Dr.pabon.

## 2018-03-22 NOTE — Telephone Encounter (Signed)
Left message for Annette Miller to return call to office.

## 2018-03-23 DIAGNOSIS — Z433 Encounter for attention to colostomy: Secondary | ICD-10-CM | POA: Diagnosis not present

## 2018-03-23 NOTE — Telephone Encounter (Signed)
Left message for patient to call office. I wanted to reach out to see if she had spoken to her son and if she had a ride to come to the office to have her wound looked at by Dr.Pabon.

## 2018-03-23 NOTE — Telephone Encounter (Signed)
Spoke with patient and she is scheduled for 03/24/18 @ 3:30 with Dr.Pabon.

## 2018-03-24 ENCOUNTER — Telehealth: Payer: Self-pay | Admitting: Family Medicine

## 2018-03-24 ENCOUNTER — Ambulatory Visit (INDEPENDENT_AMBULATORY_CARE_PROVIDER_SITE_OTHER): Payer: Medicare HMO | Admitting: Surgery

## 2018-03-24 ENCOUNTER — Encounter: Payer: Self-pay | Admitting: Surgery

## 2018-03-24 VITALS — BP 103/72 | HR 106 | Temp 97.9°F | Ht 62.0 in | Wt 85.6 lb

## 2018-03-24 DIAGNOSIS — Z09 Encounter for follow-up examination after completed treatment for conditions other than malignant neoplasm: Secondary | ICD-10-CM

## 2018-03-24 NOTE — Telephone Encounter (Signed)
Copied from CRM [id number]. Topic: General - Other >> Mar 24, 2018  1:21 PM Percival SpanishKennedy, Cheryl W wrote:  [facility]Emily [facility] to ask for verbal pt orders   1x1 ,  2x 2,  1x2    [phone]

## 2018-03-24 NOTE — Telephone Encounter (Signed)
OK to give verbal orders 

## 2018-03-24 NOTE — Patient Instructions (Signed)
Please wash the area with soap and water daily. Please place wet to dry dressing twice daily into wound. Please cover with dry dressing.   Please change bag daily or as needed.  Please increase your fluid intake and try to eat protein rich foods.  Please see your appointment listed below.

## 2018-03-24 NOTE — Telephone Encounter (Signed)
Message relayed to patient. Verbalized understanding and denied questions.   

## 2018-03-25 ENCOUNTER — Encounter: Payer: Self-pay | Admitting: Surgery

## 2018-03-25 NOTE — Progress Notes (Signed)
S/p Hartmann's 5/31 perf diverticulitis She is severely malnourished Continues to smoke She was asking fro some pain meds  PE NAD Abd: Ostomy w a leak of large stool burden, open wound filled with stool. I cleaned her wound and applied a new colostomy bag. The wound looks good is healing by secondary intention. She will likely develop a large hernia   A/P /w the pt and family in detail. I do think there is a degree of negligence  on her part. I have a feeling she is abusing some narcotics because it looked that the stool has been sitting on her wound for at least 1-2 days. I Was very candid with the patient and the family about the importance of adequate hygiene, wound care as well as ostomy care.  They now seems to be involved and understands the potential consequences of a bad abdominal wound infection. Although also understand that I will not prescribe any more narcotics since she is almost a month out and she is doing well from a surgical perspective. Encouraged for nutritional supplementations and increase diet and p.o. Intake. I will see her back in 2 to 3 weeks for follow-up. We will contact home health care for appropriate orders on wound care and ostomy care.

## 2018-03-27 NOTE — Discharge Summary (Signed)
Physician Discharge Summary  Patient ID: Annette Miller MRN: [id number] DOB/AGE: Jun 22, 1951 67 y.o.  Admit date: 02/25/2018 Discharge date: 03/12/2018  Admission Diagnoses:  Discharge Diagnoses:  Active Problems:   Diverticulitis of colon with perforation   Protein-calorie malnutrition, severe   Discharged Condition: good  Hospital Course: 67 y.o. chronically malnourished female and ongoing tobacco abuse (smoking) despite COPD [facility] for acute onset of severe abdominal pain. Workup was found to be significant for CT imaging demonstrating perforated sigmoid colonic diverticulitis with pneumoperitoneum. Informed consent was obtained and documented, and patient underwent Hartmann's partial sigmoid colectomy with end-colostomy and rectal pouch (Kaplin and Pabon, 02/25/2018), complicated by prolonged post-surgical ileus and by pulmonary edema, the latter of which required return to ICU, but improved with diuresis.  Patient's pain, respiration, and bowel function all improved/resolved and advancement of patient's diet and ambulation were ultimately well-tolerated. The remainder of patient's hospital course was essentially unremarkable, and discharge planning was initiated accordingly with patient safely able to be discharged home with appropriate discharge instructions, pain control, and outpatient surgical follow-up after all of her and family's questions were answered to their expressed satisfaction.  Consults: pulmonary/intensive care and hospitalist  Significant Diagnostic Studies: radiology: CT scan: Pneumoperitoneum, perforated sigmoid colonic diverticulitis  Treatments: IV hydration, antibiotics: Zosyn, TPN and surgery: Hartmann's partial colectomy with creation of end-colostomy and rectal pouch  Discharge Exam: Blood pressure (!) 97/55, pulse (!) 103, temperature 98.6 F (37 C), temperature source Oral, resp. rate 16, height 5\' 2"  (1.575 m), weight 110 lb 3.7 oz (50 kg), SpO2  96 %. General appearance: alert, cooperative, appears older than stated age and no distress Resp: clear to auscultation bilaterally and breathing non-labored GI: abdomen soft and non-distended with functional and viable colostomy, open midline wound with clean granulating base without surrounding erythema or drainage  Disposition:    Allergies as of 03/12/2018      Reactions   Codeine Diarrhea, Nausea And Vomiting   Prednisone Other (See Comments)   Reaction: violence      Medication List    TAKE these medications   acetaminophen 325 MG tablet Commonly known as:  TYLENOL Take 650 mg by mouth every 6 (six) hours as needed for mild pain or fever.   buPROPion 150 MG 12 hr tablet Commonly known as:  WELLBUTRIN SR TAKE 1 TABLET (150 MG TOTAL) BY MOUTH 2 (TWO) TIMES DAILY. What changed:  when to take this   diazepam 5 MG tablet Commonly known as:  VALIUM TAKE 1 TABLET BY MOUTH TWICE A DAY   fluticasone 50 MCG/ACT nasal spray Commonly known as:  FLONASE Place 2 sprays into both nostrils daily.   levothyroxine 25 MCG tablet Commonly known as:  SYNTHROID, LEVOTHROID Take 1 tablet (25 mcg total) by mouth daily.   loratadine 10 MG tablet Commonly known as:  CLARITIN Take 1 tablet (10 mg total) by mouth daily.   magnesium 30 MG tablet Take 30 mg by mouth daily.   meloxicam 15 MG tablet Commonly known as:  MOBIC Take 1 tablet (15 mg total) by mouth daily.   ondansetron 4 MG disintegrating tablet Commonly known as:  ZOFRAN ODT Take 1 tablet (4 mg total) by mouth every 8 (eight) hours as needed for nausea or vomiting.   ondansetron 4 MG tablet Commonly known as:  ZOFRAN TAKE 1 TABLET BY MOUTH EVERY 8 HOURS AS NEEDED FOR NAUSEA AND VOMITING What changed:  See the new instructions.   oxyCODONE 5 MG immediate release  tablet Commonly known as:  Oxy IR/ROXICODONE Take 1 tablet (5 mg total) by mouth every 4 (four) hours as needed for severe pain.   OYSTER SHELL CALCIUM PLUS D  PO Take 1 tablet by mouth daily.   pantoprazole 40 MG tablet Commonly known as:  PROTONIX Take 1 tablet (40 mg total) by mouth 2 (two) times daily.   potassium chloride 10 MEQ tablet Commonly known as:  K-DUR Take 4 tablets (40 mEq total) by mouth daily for 2 days.   rosuvastatin 10 MG tablet Commonly known as:  CRESTOR Take 1 tablet (10 mg total) by mouth daily.   Selenium 200 MCG Caps Take by mouth daily.   sucralfate 1 g tablet Commonly known as:  CARAFATE Take 1 tablet (1 g total) by mouth 2 (two) times daily.   vitamin C 1000 MG tablet Take 3,000 mg by mouth daily.   vitamin E 1000 UNIT capsule Take 1,000 Units by mouth 2 (two) times daily.   ZINC 15 PO Take by mouth daily.        Signed: Ancil Linsey 03/27/2018, 12:18 AM

## 2018-03-28 DIAGNOSIS — Z8674 Personal history of sudden cardiac arrest: Secondary | ICD-10-CM

## 2018-03-28 HISTORY — DX: Personal history of sudden cardiac arrest: Z86.74

## 2018-03-29 ENCOUNTER — Inpatient Hospital Stay: Payer: Medicare HMO | Admitting: Family Medicine

## 2018-03-30 ENCOUNTER — Telehealth: Payer: Self-pay | Admitting: Family Medicine

## 2018-03-30 DIAGNOSIS — Z433 Encounter for attention to colostomy: Secondary | ICD-10-CM | POA: Diagnosis not present

## 2018-03-30 DIAGNOSIS — S31601A Unspecified open wound of abdominal wall, left upper quadrant with penetration into peritoneal cavity, initial encounter: Secondary | ICD-10-CM | POA: Diagnosis not present

## 2018-03-30 NOTE — Telephone Encounter (Signed)
Copied from CRM [id number]. Topic: Quick Communication - See Telephone Encounter >> Mar 30, 2018  2:54 PM Arlyss Gandyichardson, Tien Spooner N, NT wrote: CRM for notification. See Telephone encounter for: 03/30/18. Annette Miller PT with [facility] calling and states that patient reported to him that she fainted in the bathroom and fell. No injuries reported. Annette Miller states he will advise pt if this happens again to go to the ER to be evaluated. CB#: [phone]

## 2018-04-05 ENCOUNTER — Other Ambulatory Visit: Payer: Self-pay

## 2018-04-05 ENCOUNTER — Telehealth: Payer: Self-pay | Admitting: Surgery

## 2018-04-05 ENCOUNTER — Inpatient Hospital Stay
Admission: EM | Admit: 2018-04-05 | Discharge: 2018-04-14 | DRG: 853 | Disposition: A | Discharge status: Home Health | Payer: Medicare HMO | Attending: Internal Medicine | Admitting: Internal Medicine

## 2018-04-05 ENCOUNTER — Emergency Department: Payer: Medicare HMO

## 2018-04-05 ENCOUNTER — Telehealth: Payer: Self-pay | Admitting: Family Medicine

## 2018-04-05 ENCOUNTER — Encounter: Payer: Self-pay | Admitting: Emergency Medicine

## 2018-04-05 DIAGNOSIS — N390 Urinary tract infection, site not specified: Secondary | ICD-10-CM | POA: Diagnosis present

## 2018-04-05 DIAGNOSIS — Z978 Presence of other specified devices: Secondary | ICD-10-CM

## 2018-04-05 DIAGNOSIS — Z90710 Acquired absence of both cervix and uterus: Secondary | ICD-10-CM

## 2018-04-05 DIAGNOSIS — E46 Unspecified protein-calorie malnutrition: Secondary | ICD-10-CM | POA: Diagnosis not present

## 2018-04-05 DIAGNOSIS — K922 Gastrointestinal hemorrhage, unspecified: Secondary | ICD-10-CM | POA: Diagnosis not present

## 2018-04-05 DIAGNOSIS — Z79890 Hormone replacement therapy (postmenopausal): Secondary | ICD-10-CM

## 2018-04-05 DIAGNOSIS — Z452 Encounter for adjustment and management of vascular access device: Secondary | ICD-10-CM | POA: Diagnosis not present

## 2018-04-05 DIAGNOSIS — D539 Nutritional anemia, unspecified: Secondary | ICD-10-CM | POA: Diagnosis not present

## 2018-04-05 DIAGNOSIS — D649 Anemia, unspecified: Secondary | ICD-10-CM | POA: Diagnosis not present

## 2018-04-05 DIAGNOSIS — J9601 Acute respiratory failure with hypoxia: Secondary | ICD-10-CM | POA: Diagnosis not present

## 2018-04-05 DIAGNOSIS — Z4659 Encounter for fitting and adjustment of other gastrointestinal appliance and device: Secondary | ICD-10-CM

## 2018-04-05 DIAGNOSIS — R69 Illness, unspecified: Secondary | ICD-10-CM | POA: Diagnosis not present

## 2018-04-05 DIAGNOSIS — K264 Chronic or unspecified duodenal ulcer with hemorrhage: Secondary | ICD-10-CM | POA: Diagnosis not present

## 2018-04-05 DIAGNOSIS — R627 Adult failure to thrive: Secondary | ICD-10-CM | POA: Diagnosis present

## 2018-04-05 DIAGNOSIS — F32A Depression, unspecified: Secondary | ICD-10-CM

## 2018-04-05 DIAGNOSIS — K529 Noninfective gastroenteritis and colitis, unspecified: Secondary | ICD-10-CM

## 2018-04-05 DIAGNOSIS — R402432 Glasgow coma scale score 3-8, at arrival to emergency department: Secondary | ICD-10-CM | POA: Diagnosis present

## 2018-04-05 DIAGNOSIS — Z9049 Acquired absence of other specified parts of digestive tract: Secondary | ICD-10-CM

## 2018-04-05 DIAGNOSIS — I469 Cardiac arrest, cause unspecified: Secondary | ICD-10-CM | POA: Diagnosis not present

## 2018-04-05 DIAGNOSIS — Z933 Colostomy status: Secondary | ICD-10-CM

## 2018-04-05 DIAGNOSIS — F329 Major depressive disorder, single episode, unspecified: Secondary | ICD-10-CM

## 2018-04-05 DIAGNOSIS — S31601A Unspecified open wound of abdominal wall, left upper quadrant with penetration into peritoneal cavity, initial encounter: Secondary | ICD-10-CM | POA: Diagnosis not present

## 2018-04-05 DIAGNOSIS — D7589 Other specified diseases of blood and blood-forming organs: Secondary | ICD-10-CM | POA: Diagnosis present

## 2018-04-05 DIAGNOSIS — T7840XD Allergy, unspecified, subsequent encounter: Secondary | ICD-10-CM

## 2018-04-05 DIAGNOSIS — Z681 Body mass index (BMI) 19 or less, adult: Secondary | ICD-10-CM | POA: Diagnosis not present

## 2018-04-05 DIAGNOSIS — E039 Hypothyroidism, unspecified: Secondary | ICD-10-CM | POA: Diagnosis present

## 2018-04-05 DIAGNOSIS — N170 Acute kidney failure with tubular necrosis: Secondary | ICD-10-CM | POA: Diagnosis not present

## 2018-04-05 DIAGNOSIS — E860 Dehydration: Secondary | ICD-10-CM | POA: Diagnosis present

## 2018-04-05 DIAGNOSIS — K921 Melena: Secondary | ICD-10-CM | POA: Diagnosis not present

## 2018-04-05 DIAGNOSIS — E785 Hyperlipidemia, unspecified: Secondary | ICD-10-CM | POA: Diagnosis present

## 2018-04-05 DIAGNOSIS — D62 Acute posthemorrhagic anemia: Secondary | ICD-10-CM | POA: Diagnosis present

## 2018-04-05 DIAGNOSIS — R109 Unspecified abdominal pain: Secondary | ICD-10-CM | POA: Diagnosis not present

## 2018-04-05 DIAGNOSIS — R112 Nausea with vomiting, unspecified: Secondary | ICD-10-CM | POA: Diagnosis not present

## 2018-04-05 DIAGNOSIS — J9602 Acute respiratory failure with hypercapnia: Secondary | ICD-10-CM | POA: Diagnosis present

## 2018-04-05 DIAGNOSIS — K920 Hematemesis: Secondary | ICD-10-CM | POA: Diagnosis not present

## 2018-04-05 DIAGNOSIS — R1110 Vomiting, unspecified: Secondary | ICD-10-CM | POA: Diagnosis not present

## 2018-04-05 DIAGNOSIS — A419 Sepsis, unspecified organism: Principal | ICD-10-CM

## 2018-04-05 DIAGNOSIS — R000 Tachycardia, unspecified: Secondary | ICD-10-CM | POA: Diagnosis not present

## 2018-04-05 DIAGNOSIS — Z433 Encounter for attention to colostomy: Secondary | ICD-10-CM | POA: Diagnosis not present

## 2018-04-05 DIAGNOSIS — M810 Age-related osteoporosis without current pathological fracture: Secondary | ICD-10-CM | POA: Diagnosis present

## 2018-04-05 DIAGNOSIS — Z4682 Encounter for fitting and adjustment of non-vascular catheter: Secondary | ICD-10-CM | POA: Diagnosis not present

## 2018-04-05 DIAGNOSIS — J690 Pneumonitis due to inhalation of food and vomit: Secondary | ICD-10-CM | POA: Diagnosis present

## 2018-04-05 DIAGNOSIS — F17210 Nicotine dependence, cigarettes, uncomplicated: Secondary | ICD-10-CM | POA: Diagnosis present

## 2018-04-05 DIAGNOSIS — R0602 Shortness of breath: Secondary | ICD-10-CM | POA: Diagnosis not present

## 2018-04-05 DIAGNOSIS — N179 Acute kidney failure, unspecified: Secondary | ICD-10-CM | POA: Diagnosis not present

## 2018-04-05 DIAGNOSIS — Z720 Tobacco use: Secondary | ICD-10-CM | POA: Diagnosis not present

## 2018-04-05 DIAGNOSIS — R079 Chest pain, unspecified: Secondary | ICD-10-CM | POA: Diagnosis not present

## 2018-04-05 DIAGNOSIS — K269 Duodenal ulcer, unspecified as acute or chronic, without hemorrhage or perforation: Secondary | ICD-10-CM | POA: Diagnosis not present

## 2018-04-05 LAB — CBC
HEMATOCRIT: 17.6 % — AB (ref 35.0–47.0)
HEMOGLOBIN: 5.9 g/dL — AB (ref 12.0–16.0)
MCH: 36.7 pg — AB (ref 26.0–34.0)
MCHC: 33.8 g/dL (ref 32.0–36.0)
MCV: 108.6 fL — ABNORMAL HIGH (ref 80.0–100.0)
Platelets: 407 10*3/uL (ref 150–440)
RBC: 1.62 MIL/uL — AB (ref 3.80–5.20)
RDW: 24.2 % — ABNORMAL HIGH (ref 11.5–14.5)
WBC: 8.2 10*3/uL (ref 3.6–11.0)

## 2018-04-05 LAB — URINALYSIS, ROUTINE W REFLEX MICROSCOPIC
BILIRUBIN URINE: NEGATIVE
GLUCOSE, UA: NEGATIVE mg/dL
HGB URINE DIPSTICK: NEGATIVE
KETONES UR: NEGATIVE mg/dL
LEUKOCYTES UA: NEGATIVE
NITRITE: NEGATIVE
PH: 5 (ref 5.0–8.0)
Protein, ur: 30 mg/dL — AB
Specific Gravity, Urine: 1.016 (ref 1.005–1.030)

## 2018-04-05 LAB — BASIC METABOLIC PANEL
ANION GAP: 8 (ref 5–15)
BUN: 20 mg/dL (ref 8–23)
CALCIUM: 10.2 mg/dL (ref 8.9–10.3)
CO2: 30 mmol/L (ref 22–32)
Chloride: 98 mmol/L (ref 98–111)
Creatinine, Ser: 1.67 mg/dL — ABNORMAL HIGH (ref 0.44–1.00)
GFR, EST AFRICAN AMERICAN: 36 mL/min — AB (ref >60)
GFR, EST NON AFRICAN AMERICAN: 31 mL/min — AB (ref >60)
GLUCOSE: 124 mg/dL — AB (ref 70–99)
POTASSIUM: 4.2 mmol/L (ref 3.5–5.1)
Sodium: 136 mmol/L (ref 135–145)

## 2018-04-05 LAB — LIPASE, BLOOD: LIPASE: 18 U/L (ref 11–51)

## 2018-04-05 LAB — HEPATIC FUNCTION PANEL
ALK PHOS: 114 U/L (ref 38–126)
ALT: 24 U/L (ref 0–44)
AST: 32 U/L (ref 15–41)
Albumin: 1.7 g/dL — ABNORMAL LOW (ref 3.5–5.0)
Bilirubin, Direct: 0.2 mg/dL (ref 0.0–0.2)
Indirect Bilirubin: 0.4 mg/dL (ref 0.3–0.9)
TOTAL PROTEIN: 5 g/dL — AB (ref 6.5–8.1)
Total Bilirubin: 0.6 mg/dL (ref 0.3–1.2)

## 2018-04-05 LAB — TROPONIN I
TROPONIN I: 0.03 ng/mL — AB (ref <0.03)
Troponin I: 0.03 ng/mL (ref <0.03)

## 2018-04-05 LAB — LACTIC ACID, PLASMA
Lactic Acid, Venous: 3.4 mmol/L (ref 0.5–1.9)
Lactic Acid, Venous: 2.3 mmol/L (ref 0.5–1.9)

## 2018-04-05 LAB — PREPARE RBC (CROSSMATCH)

## 2018-04-05 MED ORDER — SODIUM CHLORIDE 0.9 % IV SOLN
8.0000 mg/h | INTRAVENOUS | Status: DC
  Administered 2018-04-05: 8 mg/h via INTRAVENOUS
  Filled 2018-04-05: qty 80

## 2018-04-05 MED ORDER — SODIUM CHLORIDE 0.9 % IV BOLUS
1000.0000 mL | Freq: Once | INTRAVENOUS | Status: AC
  Administered 2018-04-05: 1000 mL via INTRAVENOUS

## 2018-04-05 MED ORDER — IOHEXOL 300 MG/ML  SOLN
60.0000 mL | Freq: Once | INTRAMUSCULAR | Status: AC | PRN
  Administered 2018-04-05: 75 mL via INTRAVENOUS

## 2018-04-05 MED ORDER — SODIUM CHLORIDE 0.9 % IV SOLN
80.0000 mg | Freq: Once | INTRAVENOUS | Status: AC
  Administered 2018-04-05: 80 mg via INTRAVENOUS
  Filled 2018-04-05: qty 80

## 2018-04-05 MED ORDER — ONDANSETRON HCL 4 MG/2ML IJ SOLN
4.0000 mg | Freq: Once | INTRAMUSCULAR | Status: AC
  Administered 2018-04-05: 4 mg via INTRAVENOUS
  Filled 2018-04-05: qty 2

## 2018-04-05 MED ORDER — HYDROMORPHONE HCL 1 MG/ML IJ SOLN
0.5000 mg | Freq: Once | INTRAMUSCULAR | Status: AC
  Administered 2018-04-05: 0.5 mg via INTRAVENOUS
  Filled 2018-04-05: qty 1

## 2018-04-05 MED ORDER — PIPERACILLIN-TAZOBACTAM 3.375 G IVPB 30 MIN
3.3750 g | Freq: Once | INTRAVENOUS | Status: AC
  Administered 2018-04-05: 3.375 g via INTRAVENOUS
  Filled 2018-04-05: qty 50

## 2018-04-05 MED ORDER — SODIUM CHLORIDE 0.9 % IV SOLN
10.0000 mL/h | Freq: Once | INTRAVENOUS | Status: AC
  Administered 2018-04-05: 10 mL/h via INTRAVENOUS

## 2018-04-05 MED ORDER — PANTOPRAZOLE SODIUM 40 MG IV SOLR
40.0000 mg | Freq: Two times a day (BID) | INTRAVENOUS | Status: DC
  Administered 2018-04-09: 40 mg via INTRAVENOUS
  Filled 2018-04-05: qty 40

## 2018-04-05 MED ORDER — IOPAMIDOL (ISOVUE-300) INJECTION 61%
30.0000 mL | Freq: Once | INTRAVENOUS | Status: AC | PRN
Start: 2018-04-05 — End: 2018-04-05
  Administered 2018-04-05: 30 mL via ORAL

## 2018-04-05 NOTE — Telephone Encounter (Signed)
Copied from CRM [id number]. Topic: Quick Communication - See Telephone Encounter >> Apr 05, 2018 11:32 AM Lorrine KinMcGee, Fitzgerald Dunne B, NT wrote: CRM for notification. See Telephone encounter for: 04/05/18. Irving BurtonEmily, physical therapist with [facility] calling and states that the patient cancelled her physical therapy visit today. States that she has been throwing up for 3 days and was not up for physical therapy today. Irving Burtonmily states that she let the nurse know so they could check on her. She would also like to know if approval for bedside commode could be done to help with bathing and bathroom needs? CB#: [phone]

## 2018-04-05 NOTE — Telephone Encounter (Signed)
Requested items are okay

## 2018-04-05 NOTE — ED Provider Notes (Addendum)
[facility] Emergency Department Provider Note  ____________________________________________  Time seen: Approximately 6:31 PM  I have reviewed the triage vital signs and the nursing notes.   HISTORY  Chief Complaint Chest Pain; Nausea; Emesis; and Weakness    HPI Annette Miller is a 67 y.o. female with a history of perforated sigmoid diverticulitis status post status post partial colectomy with colostomy 02/25/2018 presenting for lightheadedness with standing, nausea and vomiting, chest pain, and left upper quadrant pain.  The patient reports that her midline wet-to-dry dressings have remained well-appearing without any purulent discharge or severe pain.  Her ostomy pouch has been putting out "a lot" which is normal.  However, she is developed multiple episodes of nausea and vomiting as well as an associated central chest pain and some left upper quadrant pain.  The patient has not been having fevers or urinary symptoms.  She states that she is so lightheaded she is unable to take more than 3 steps at a time.   Past Medical History:  Diagnosis Date  . Allergy   . Anemia   . Arthritis   . Depression   . Goiter   . Hyperlipidemia   . Hypothyroidism   . Osteoporosis     Patient Active Problem List   Diagnosis Date Noted  . Protein-calorie malnutrition, severe 02/27/2018  . Diverticulitis of colon with perforation 02/25/2018  . Intestinal perforation (HCC)   . PUD (peptic ulcer disease) 12/15/2017  . Kidney stone 12/08/2017  . Anxiety 05/20/2017  . Allergy 05/20/2017  . Trapezius muscle spasm 01/27/2017  . H/O total hysterectomy 05/12/2016  . Hyperlipidemia 05/08/2015  . Depression 05/08/2015  . Hypothyroidism 05/08/2015    Past Surgical History:  Procedure Laterality Date  . ABDOMINAL HYSTERECTOMY    . COLONOSCOPY WITH PROPOFOL N/A 01/14/2018   Procedure: COLONOSCOPY WITH PROPOFOL;  Surgeon: Toney ReilVanga, Rohini Reddy, MD;  [facility];   Service: Gastroenterology;  Laterality: N/A;  . ESOPHAGOGASTRODUODENOSCOPY (EGD) WITH PROPOFOL N/A 01/14/2018   Procedure: ESOPHAGOGASTRODUODENOSCOPY (EGD) WITH PROPOFOL;  Surgeon: Toney ReilVanga, Rohini Reddy, MD;  Location: [facility] ENDOSCOPY;  Service: Gastroenterology;  Laterality: N/A;  . LAPAROTOMY N/A 02/25/2018   Procedure: EXPLORATORY LAPAROTOMY, PARTMAN'S PROCEDURE, APPENDECTOMY, COLOSTOMY;  Surgeon: Star AgeKaplin, Aviva W, DO;  Location: [facility] ORS;  Service: General;  Laterality: N/A;  . TONSILLECTOMY      Current Outpatient Rx  . Order #: [id number] Class: Historical Med  . Order #: [id number] Class: Historical Med  . Order #: [id number] Class: Normal  . Order #: [id number] Class: Historical Med  . Order #: [id number] Class: Print  . Order #: [id number] Class: Normal  . Order #: [id number] Class: Normal  . Order #: [id number] Class: Normal  . Order #: [id number] Class: Historical Med  . Order #: [id number] Class: Normal  . Order #: [id number] Class: Print  . Order #: [id number] Class: Normal  . Order #: [id number] Class: Print  . Order #: [id number] Class: Normal  . Order #: [id number] Class: Normal  . Order #: [id number] Class: Normal  . Order #: [id number] Class: Historical Med  . Order #: [id number] Class: Print  . Order #: [id number] Class: Historical Med  . Order #: [id number] Class: Historical Med    Allergies Codeine and Prednisone  Family History  Problem Relation Age of Onset  . Cancer Mother   . AAA (abdominal aortic aneurysm) Father   . Cancer Maternal Grandmother   . Stroke Maternal Grandmother   . Cancer Paternal Grandfather     Social History Social History   Tobacco Use  . Smoking status: Current Every Day Smoker  Packs/day: 0.10    Types: Cigarettes  . Smokeless tobacco: Never Used  Substance Use Topics  . Alcohol use: No  . Drug use: No    Review of Systems Constitutional: No fever/chills.  As of lightheadedness without syncope.  Positive general malaise. Eyes: No visual changes. ENT: No  sore throat. No congestion or rhinorrhea. Cardiovascular: Denies chest pain. Denies palpitations. Respiratory: Denies shortness of breath.  No cough. Gastrointestinal: Positive left upper quadrant abdominal pain.  Midline surgical incision without any complications.  +nausea, +vomiting.  No change in ostomy output. Genitourinary: Negative for dysuria.  No urinary frequency. Musculoskeletal: Negative for back pain. Skin: Negative for rash. Neurological: Negative for headaches. No focal numbness, tingling or weakness.     ____________________________________________   PHYSICAL EXAM:  VITAL SIGNS: ED Triage Vitals  Enc Vitals Group     BP 04/05/18 1752 94/63     Pulse Rate 04/05/18 1752 (!) 123     Resp 04/05/18 1752 18     Temp 04/05/18 1752 97.6 F (36.4 C)     Temp Source 04/05/18 1752 Oral     SpO2 04/05/18 1752 92 %     Weight 04/05/18 1755 85 lb (38.6 kg)     Height 04/05/18 1755 5\' 2"  (1.575 m)     Head Circumference --      Peak Flow --      Pain Score 04/05/18 1755 7     Pain Loc --      Pain Edu? --      Excl. in GC? --     Constitutional: Alert and oriented.  Answers questions appropriately.  The patient is hypertensive and tachycardic, chronically ill-appearing and uncomfortable.  She is mentating normally. Eyes: Conjunctivae are normal.  EOMI. No scleral icterus. Head: Atraumatic. Nose: No congestion/rhinnorhea. Mouth/Throat: Mucous membranes are dry.  Neck: No stridor.  Supple.  No JVD.  No meningismus. Cardiovascular: Fast rate, regular rhythm. No murmurs, rubs or gallops.  Respiratory: Normal respiratory effort.  No accessory muscle use or retractions. Lungs CTAB.  No wheezes, rales or ronchi. Gastrointestinal: Soft, and nondistended.  Tender to palpation in the left upper quadrant.  The patient has a large central surgical incision with gauze, normal granulation tissue and no evidence of surrounding erythema, fluctuance or abnormal discharge.  She has an  ostomy in the left lower quadrant with liquid output.  No guarding or rebound.  No peritoneal signs. Musculoskeletal: No LE edema. Neurologic:  A&Ox3.  Speech is clear.  Face and smile are symmetric.  EOMI.  Moves all extremities well. Skin:  Skin is warm, dry and intact. No rash noted. Psychiatric: Mood and affect are normal. Speech and behavior are normal.  Normal judgement.  ____________________________________________   LABS (all labs ordered are listed, but only abnormal results are displayed)  Labs Reviewed  CULTURE, BLOOD (ROUTINE X 2)  CULTURE, BLOOD (ROUTINE X 2)  BASIC METABOLIC PANEL  CBC  TROPONIN I  HEPATIC FUNCTION PANEL  LIPASE, BLOOD  URINALYSIS, ROUTINE W REFLEX MICROSCOPIC  LACTIC ACID, PLASMA  LACTIC ACID, PLASMA  BLOOD GAS, VENOUS   ____________________________________________  EKG  ED ECG REPORT I, Rockne Menghini, the attending physician, personally viewed and interpreted this ECG.   Date: 04/05/2018  EKG Time: 1748  Rate: 125  Rhythm: sinus tachycardia  Axis: normal  Intervals:none  ST&T Change: No STEMI  ____________________________________________  RADIOLOGY  Dg Chest 2 View  Result Date: 04/05/2018 CLINICAL DATA:  Chest pain EXAM: CHEST - 2 VIEW  COMPARISON:  March 04, 2018 FINDINGS: Nonacute proximal right humeral fracture. The heart, hila, mediastinum, lungs, and pleura are otherwise normal. IMPRESSION: Nonacute proximal right humeral fracture.  No acute abnormality. Electronically Signed   By: Gerome Sam III M.D   On: 04/05/2018 18:17    ____________________________________________   PROCEDURES  Procedure(s) performed: None  Procedures  Critical Care performed: Yes, see critical care note(s) ____________________________________________   INITIAL IMPRESSION / ASSESSMENT AND PLAN / ED COURSE  Pertinent labs & imaging results that were available during my care of the patient were reviewed by me and considered in my  medical decision making (see chart for details).  67 y.o. female with recent partial colectomy and ostomy after perforated sigmoid diverticulitis presenting for hypotension, tachycardia with associated lightheadedness, nausea and vomiting, abdominal pain.  Overall, I am concerned that the patient is septic and may be having an intra-abdominal infection.  Her exterior abdominal surgical site is well-appearing.  Other possible etiologies include UTI or bacteremia.  The patient may also have abnormal vital signs purely from hypovolemia due to nausea and vomiting.  At this time, the patient will receive intravenous fluids, empiric antibiotics, and undergo CT examination of the abdomen.  The patient's chest pain is much less likely to be cardiac but we will continue to monitor her troponin; her EKG is reassuring without ischemic changes.  Plan admission.  ----------------------------------------- 7:52 PM on 04/05/2018 -----------------------------------------  The patient does have a hemoglobin of 5.9 with a hematocrit of 17.2 which is several points lower than her prior studies.  I have ordered a type and screen as well as a unit of packed red blood cells for transfusion and I have discussed this with the patient.  Clinically, the patient is improving with intravenous fluids and now has a blood pressure of 106/67 with a heart rate of 87.  I am awaiting the remainder of her blood work, as well as CT examination.  ----------------------------------------- 10:05 PM on 04/05/2018 -----------------------------------------  Patient's work-up in the emergency department has been concerning for colitis with sepsis, as well as symptomatic anemia and possible GI source of bleeding.  The patient was found to be significantly anemic and a unit of blood has been ordered.  Initially, she responded well to fluids, but now has a blood pressure of 8455; we will re-dose her fluids.  Has received antibiotics.  At this time,  I will review the patient's findings with the surgeon on-call, and plan to admit her to the hospital.  CRITICAL CARE Performed by: Rockne Menghini   Total critical care time: 45 minutes  Critical care time was exclusive of separately billable procedures and treating other patients.  Critical care was necessary to treat or prevent imminent or life-threatening deterioration.  Critical care was time spent personally by me on the following activities: development of treatment plan with patient and/or surrogate as well as nursing, discussions with consultants, evaluation of patient's response to treatment, examination of patient, obtaining history from patient or surrogate, ordering and performing treatments and interventions, ordering and review of laboratory studies, ordering and review of radiographic studies, pulse oximetry and re-evaluation of patient's condition.   ____________________________________________  FINAL CLINICAL IMPRESSION(S) / ED DIAGNOSES  Final diagnoses:  None         NEW MEDICATIONS STARTED DURING THIS VISIT:  New Prescriptions   No medications on file      Rockne Menghini, MD 04/05/18 1953    Rockne Menghini, MD 04/05/18 2206

## 2018-04-05 NOTE — ED Triage Notes (Signed)
Pt reports that her MD sent her here because she is having Nausea, Vomiting, chest pain and weakness. Pt reports that she was here three weeks ago for a perforated bowel. Pt is pale and tachy.

## 2018-04-05 NOTE — Telephone Encounter (Signed)
Annette Miller with home health called. Patient is unable to keep anything on her stomach. Wound looks good and dressings are being changed as needed. In addition to nausea, the patient is very weak. Please advise Annette Miller what to do.

## 2018-04-05 NOTE — ED Notes (Signed)
Patient transported to CT 

## 2018-04-05 NOTE — Telephone Encounter (Signed)
Called Amy back and told her that I had spoken to Dr. Everlene FarrierPabon in reference to the patient's condition and he recommended for her to go to the ED. Amy stated that she agreed and that she would tell the patient's son know. Amy had no further questions at this time.

## 2018-04-05 NOTE — Telephone Encounter (Signed)
Left message for Amy to return call to office.

## 2018-04-05 NOTE — Progress Notes (Signed)
CODE SEPSIS - PHARMACY COMMUNICATION  **Broad Spectrum Antibiotics should be administered within 1 hour of Sepsis diagnosis**  Time Code Sepsis Called/Page Received: 1846  Antibiotics Ordered: Zosyn  Time of 1st antibiotic administration: 1927  Additional action taken by pharmacy: contacted RN at 1915  If necessary, Name of Provider/Nurse Contacted: N/A    Annette Miller ,PharmD Clinical Pharmacist  04/05/2018  7:33 PM

## 2018-04-06 ENCOUNTER — Encounter: Payer: Self-pay | Admitting: Surgery

## 2018-04-06 ENCOUNTER — Inpatient Hospital Stay: Payer: Medicare HMO | Admitting: Anesthesiology

## 2018-04-06 ENCOUNTER — Inpatient Hospital Stay: Payer: Medicare HMO

## 2018-04-06 ENCOUNTER — Encounter
Admission: EM | Disposition: A | Discharge status: Home Health | Payer: Self-pay | Source: Home / Self Care | Attending: Internal Medicine

## 2018-04-06 ENCOUNTER — Other Ambulatory Visit: Payer: Self-pay

## 2018-04-06 DIAGNOSIS — R627 Adult failure to thrive: Secondary | ICD-10-CM | POA: Diagnosis present

## 2018-04-06 DIAGNOSIS — D62 Acute posthemorrhagic anemia: Secondary | ICD-10-CM

## 2018-04-06 DIAGNOSIS — E785 Hyperlipidemia, unspecified: Secondary | ICD-10-CM | POA: Diagnosis present

## 2018-04-06 DIAGNOSIS — S31601A Unspecified open wound of abdominal wall, left upper quadrant with penetration into peritoneal cavity, initial encounter: Secondary | ICD-10-CM | POA: Diagnosis not present

## 2018-04-06 DIAGNOSIS — E46 Unspecified protein-calorie malnutrition: Secondary | ICD-10-CM | POA: Diagnosis present

## 2018-04-06 DIAGNOSIS — K264 Chronic or unspecified duodenal ulcer with hemorrhage: Secondary | ICD-10-CM | POA: Diagnosis present

## 2018-04-06 DIAGNOSIS — N170 Acute kidney failure with tubular necrosis: Secondary | ICD-10-CM | POA: Diagnosis present

## 2018-04-06 DIAGNOSIS — K920 Hematemesis: Secondary | ICD-10-CM | POA: Diagnosis not present

## 2018-04-06 DIAGNOSIS — J9601 Acute respiratory failure with hypoxia: Secondary | ICD-10-CM | POA: Diagnosis present

## 2018-04-06 DIAGNOSIS — D7589 Other specified diseases of blood and blood-forming organs: Secondary | ICD-10-CM | POA: Diagnosis present

## 2018-04-06 DIAGNOSIS — I469 Cardiac arrest, cause unspecified: Secondary | ICD-10-CM | POA: Diagnosis present

## 2018-04-06 DIAGNOSIS — R402432 Glasgow coma scale score 3-8, at arrival to emergency department: Secondary | ICD-10-CM | POA: Diagnosis present

## 2018-04-06 DIAGNOSIS — D649 Anemia, unspecified: Secondary | ICD-10-CM

## 2018-04-06 DIAGNOSIS — K922 Gastrointestinal hemorrhage, unspecified: Secondary | ICD-10-CM | POA: Diagnosis not present

## 2018-04-06 DIAGNOSIS — K921 Melena: Secondary | ICD-10-CM

## 2018-04-06 DIAGNOSIS — Z90710 Acquired absence of both cervix and uterus: Secondary | ICD-10-CM | POA: Diagnosis not present

## 2018-04-06 DIAGNOSIS — D539 Nutritional anemia, unspecified: Secondary | ICD-10-CM

## 2018-04-06 DIAGNOSIS — E039 Hypothyroidism, unspecified: Secondary | ICD-10-CM | POA: Diagnosis present

## 2018-04-06 DIAGNOSIS — N390 Urinary tract infection, site not specified: Secondary | ICD-10-CM | POA: Diagnosis present

## 2018-04-06 DIAGNOSIS — Z720 Tobacco use: Secondary | ICD-10-CM | POA: Diagnosis not present

## 2018-04-06 DIAGNOSIS — R112 Nausea with vomiting, unspecified: Secondary | ICD-10-CM | POA: Diagnosis present

## 2018-04-06 DIAGNOSIS — K269 Duodenal ulcer, unspecified as acute or chronic, without hemorrhage or perforation: Secondary | ICD-10-CM

## 2018-04-06 DIAGNOSIS — K529 Noninfective gastroenteritis and colitis, unspecified: Secondary | ICD-10-CM

## 2018-04-06 DIAGNOSIS — F17210 Nicotine dependence, cigarettes, uncomplicated: Secondary | ICD-10-CM | POA: Diagnosis present

## 2018-04-06 DIAGNOSIS — E860 Dehydration: Secondary | ICD-10-CM | POA: Diagnosis present

## 2018-04-06 DIAGNOSIS — J690 Pneumonitis due to inhalation of food and vomit: Secondary | ICD-10-CM | POA: Diagnosis present

## 2018-04-06 DIAGNOSIS — A419 Sepsis, unspecified organism: Secondary | ICD-10-CM | POA: Diagnosis present

## 2018-04-06 DIAGNOSIS — Z933 Colostomy status: Secondary | ICD-10-CM | POA: Diagnosis not present

## 2018-04-06 DIAGNOSIS — Z79890 Hormone replacement therapy (postmenopausal): Secondary | ICD-10-CM | POA: Diagnosis not present

## 2018-04-06 DIAGNOSIS — Z9049 Acquired absence of other specified parts of digestive tract: Secondary | ICD-10-CM | POA: Diagnosis not present

## 2018-04-06 DIAGNOSIS — M810 Age-related osteoporosis without current pathological fracture: Secondary | ICD-10-CM | POA: Diagnosis present

## 2018-04-06 DIAGNOSIS — Z433 Encounter for attention to colostomy: Secondary | ICD-10-CM | POA: Diagnosis not present

## 2018-04-06 DIAGNOSIS — Z681 Body mass index (BMI) 19 or less, adult: Secondary | ICD-10-CM | POA: Diagnosis not present

## 2018-04-06 DIAGNOSIS — J9602 Acute respiratory failure with hypercapnia: Secondary | ICD-10-CM | POA: Diagnosis present

## 2018-04-06 DIAGNOSIS — N179 Acute kidney failure, unspecified: Secondary | ICD-10-CM | POA: Diagnosis not present

## 2018-04-06 HISTORY — PX: ESOPHAGOGASTRODUODENOSCOPY: SHX5428

## 2018-04-06 LAB — BASIC METABOLIC PANEL
Anion gap: 5 (ref 5–15)
BUN: 20 mg/dL (ref 8–23)
CO2: 27 mmol/L (ref 22–32)
Calcium: 8.3 mg/dL — ABNORMAL LOW (ref 8.9–10.3)
Chloride: 105 mmol/L (ref 98–111)
Creatinine, Ser: 1.31 mg/dL — ABNORMAL HIGH (ref 0.44–1.00)
GFR, EST AFRICAN AMERICAN: 48 mL/min — AB (ref >60)
GFR, EST NON AFRICAN AMERICAN: 41 mL/min — AB (ref >60)
GLUCOSE: 62 mg/dL — AB (ref 70–99)
POTASSIUM: 3.5 mmol/L (ref 3.5–5.1)
Sodium: 137 mmol/L (ref 135–145)

## 2018-04-06 LAB — COMPREHENSIVE METABOLIC PANEL
ALBUMIN: 1.1 g/dL — AB (ref 3.5–5.0)
ALT: 15 U/L (ref 0–44)
AST: 24 U/L (ref 15–41)
Alkaline Phosphatase: 74 U/L (ref 38–126)
Anion gap: 6 (ref 5–15)
BUN: 22 mg/dL (ref 8–23)
CHLORIDE: 111 mmol/L (ref 98–111)
CO2: 23 mmol/L (ref 22–32)
Calcium: 7.1 mg/dL — ABNORMAL LOW (ref 8.9–10.3)
Creatinine, Ser: 1.31 mg/dL — ABNORMAL HIGH (ref 0.44–1.00)
GFR calc Af Amer: 48 mL/min — ABNORMAL LOW (ref >60)
GFR calc non Af Amer: 41 mL/min — ABNORMAL LOW (ref >60)
Glucose, Bld: 116 mg/dL — ABNORMAL HIGH (ref 70–99)
POTASSIUM: 3.6 mmol/L (ref 3.5–5.1)
SODIUM: 140 mmol/L (ref 135–145)
Total Bilirubin: 0.5 mg/dL (ref 0.3–1.2)
Total Protein: 3.4 g/dL — ABNORMAL LOW (ref 6.5–8.1)

## 2018-04-06 LAB — IRON AND TIBC: IRON: 87 ug/dL (ref 28–170)

## 2018-04-06 LAB — CBC
HCT: 16.8 % — ABNORMAL LOW (ref 35.0–47.0)
HEMATOCRIT: 21.1 % — AB (ref 35.0–47.0)
HEMOGLOBIN: 7.3 g/dL — AB (ref 12.0–16.0)
Hemoglobin: 5.8 g/dL — ABNORMAL LOW (ref 12.0–16.0)
MCH: 35.5 pg — ABNORMAL HIGH (ref 26.0–34.0)
MCH: 36.1 pg — ABNORMAL HIGH (ref 26.0–34.0)
MCHC: 34.6 g/dL (ref 32.0–36.0)
MCHC: 34.7 g/dL (ref 32.0–36.0)
MCV: 102.6 fL — ABNORMAL HIGH (ref 80.0–100.0)
MCV: 103.9 fL — ABNORMAL HIGH (ref 80.0–100.0)
PLATELETS: 335 10*3/uL (ref 150–440)
Platelets: 362 10*3/uL (ref 150–440)
RBC: 2.06 MIL/uL — AB (ref 3.80–5.20)
RBC: 1.62 MIL/uL — ABNORMAL LOW (ref 3.80–5.20)
RDW: 21.7 % — ABNORMAL HIGH (ref 11.5–14.5)
RDW: 22.1 % — ABNORMAL HIGH (ref 11.5–14.5)
WBC: 8.1 10*3/uL (ref 3.6–11.0)
WBC: 6.9 10*3/uL (ref 3.6–11.0)

## 2018-04-06 LAB — APTT: APTT: 36 s (ref 24–36)

## 2018-04-06 LAB — GLUCOSE, CAPILLARY: GLUCOSE-CAPILLARY: 85 mg/dL (ref 70–99)

## 2018-04-06 LAB — BLOOD GAS, ARTERIAL
ACID-BASE DEFICIT: 1.5 mmol/L (ref 0.0–2.0)
Bicarbonate: 21.1 mmol/L (ref 20.0–28.0)
FIO2: 0.6
LHR: 18 {breaths}/min
O2 Saturation: 98.2 %
PEEP: 5 cmH2O
PH ART: 7.47 — AB (ref 7.350–7.450)
PO2 ART: 102 mmHg (ref 83.0–108.0)
Patient temperature: 37
VT: 500 mL
pCO2 arterial: 29 mmHg — ABNORMAL LOW (ref 32.0–48.0)

## 2018-04-06 LAB — PROTIME-INR
INR: 1.3
Prothrombin Time: 16.1 seconds — ABNORMAL HIGH (ref 11.4–15.2)

## 2018-04-06 LAB — FOLATE: Folate: 5.1 ng/mL — ABNORMAL LOW (ref >5.9)

## 2018-04-06 LAB — MRSA PCR SCREENING: MRSA by PCR: NEGATIVE

## 2018-04-06 LAB — PREALBUMIN: PREALBUMIN: 7.7 mg/dL — AB (ref 18–38)

## 2018-04-06 LAB — FERRITIN: Ferritin: 127 ng/mL (ref 11–307)

## 2018-04-06 LAB — VITAMIN B12: Vitamin B-12: 574 pg/mL (ref 180–914)

## 2018-04-06 LAB — PREPARE RBC (CROSSMATCH)

## 2018-04-06 SURGERY — EGD (ESOPHAGOGASTRODUODENOSCOPY)
Anesthesia: General

## 2018-04-06 MED ORDER — PROPOFOL 500 MG/50ML IV EMUL
INTRAVENOUS | Status: DC | PRN
  Administered 2018-04-06: 175 ug/kg/min via INTRAVENOUS

## 2018-04-06 MED ORDER — DOCUSATE SODIUM 100 MG PO CAPS
100.0000 mg | ORAL_CAPSULE | Freq: Two times a day (BID) | ORAL | Status: DC
  Administered 2018-04-06: 100 mg via ORAL
  Filled 2018-04-06 (×2): qty 1

## 2018-04-06 MED ORDER — OCUVITE-LUTEIN PO CAPS
1.0000 | ORAL_CAPSULE | Freq: Every day | ORAL | Status: DC
  Filled 2018-04-06: qty 1

## 2018-04-06 MED ORDER — SODIUM CHLORIDE 0.9 % IV SOLN
8.0000 mg/h | INTRAVENOUS | Status: DC
  Administered 2018-04-06 – 2018-04-09 (×4): 8 mg/h via INTRAVENOUS
  Filled 2018-04-06 (×8): qty 80

## 2018-04-06 MED ORDER — LEVOTHYROXINE SODIUM 50 MCG PO TABS
25.0000 ug | ORAL_TABLET | Freq: Every day | ORAL | Status: DC
  Administered 2018-04-06: 25 ug via ORAL
  Filled 2018-04-06 (×2): qty 1

## 2018-04-06 MED ORDER — SODIUM CHLORIDE 0.9 % IV SOLN
INTRAVENOUS | Status: DC
  Administered 2018-04-06 – 2018-04-07 (×2): via INTRAVENOUS

## 2018-04-06 MED ORDER — VITAMIN E 180 MG (400 UNIT) PO CAPS
1000.0000 [IU] | ORAL_CAPSULE | Freq: Two times a day (BID) | ORAL | Status: DC
Start: 2018-04-06 — End: 2018-04-07
  Administered 2018-04-06: 1000 [IU] via ORAL
  Filled 2018-04-06 (×4): qty 2

## 2018-04-06 MED ORDER — LIDOCAINE HCL (CARDIAC) PF 100 MG/5ML IV SOSY
PREFILLED_SYRINGE | INTRAVENOUS | Status: DC | PRN
  Administered 2018-04-06: 50 mg via INTRAVENOUS

## 2018-04-06 MED ORDER — SODIUM CHLORIDE 0.9 % IV BOLUS
1000.0000 mL | Freq: Once | INTRAVENOUS | Status: AC
  Administered 2018-04-06: 1000 mL via INTRAVENOUS

## 2018-04-06 MED ORDER — SODIUM CHLORIDE 0.9 % IV SOLN
INTRAVENOUS | Status: DC
  Administered 2018-04-06: 04:00:00 via INTRAVENOUS

## 2018-04-06 MED ORDER — NOREPINEPHRINE 4 MG/250ML-% IV SOLN
0.0000 ug/min | INTRAVENOUS | Status: DC
  Administered 2018-04-06: 20 ug/min via INTRAVENOUS
  Administered 2018-04-07: 2 ug/min via INTRAVENOUS
  Filled 2018-04-06: qty 250

## 2018-04-06 MED ORDER — SODIUM CHLORIDE 0.9 % IV SOLN
80.0000 mg | Freq: Once | INTRAVENOUS | Status: AC
  Administered 2018-04-06: 80 mg via INTRAVENOUS
  Filled 2018-04-06: qty 80

## 2018-04-06 MED ORDER — LORATADINE 10 MG PO TABS
10.0000 mg | ORAL_TABLET | Freq: Every day | ORAL | Status: DC
  Administered 2018-04-06: 10 mg via ORAL
  Filled 2018-04-06 (×2): qty 1

## 2018-04-06 MED ORDER — SODIUM CHLORIDE 0.9% IV SOLUTION: Freq: Once | INTRAVENOUS | Status: DC

## 2018-04-06 MED ORDER — FENTANYL 2500MCG IN NS 250ML (10MCG/ML) PREMIX INFUSION
25.0000 ug/h | INTRAVENOUS | Status: DC
  Administered 2018-04-06: 100 ug/h via INTRAVENOUS
  Administered 2018-04-07 (×2): 300 ug/h via INTRAVENOUS
  Filled 2018-04-06 (×2): qty 250

## 2018-04-06 MED ORDER — TRAZODONE HCL 50 MG PO TABS: 25.0000 mg | ORAL_TABLET | Freq: Every evening | ORAL | Status: DC | PRN

## 2018-04-06 MED ORDER — ACETAMINOPHEN 650 MG RE SUPP: 650.0000 mg | Freq: Four times a day (QID) | RECTAL | Status: DC | PRN

## 2018-04-06 MED ORDER — MIDAZOLAM HCL 2 MG/2ML IJ SOLN: 1.0000 mg | INTRAMUSCULAR | Status: DC | PRN

## 2018-04-06 MED ORDER — HYDROCODONE-ACETAMINOPHEN 5-325 MG PO TABS
1.0000 | ORAL_TABLET | ORAL | Status: DC | PRN
  Administered 2018-04-06 (×2): 1 via ORAL
  Filled 2018-04-06 (×2): qty 1

## 2018-04-06 MED ORDER — ONDANSETRON HCL 4 MG/2ML IJ SOLN
4.0000 mg | Freq: Four times a day (QID) | INTRAMUSCULAR | Status: DC | PRN
  Administered 2018-04-08 – 2018-04-14 (×6): 4 mg via INTRAVENOUS
  Filled 2018-04-06 (×6): qty 2

## 2018-04-06 MED ORDER — FENTANYL CITRATE (PF) 100 MCG/2ML IJ SOLN
50.0000 ug | Freq: Once | INTRAMUSCULAR | Status: AC
Start: 2018-04-06 — End: 2018-04-06
  Administered 2018-04-06: 50 ug via INTRAVENOUS

## 2018-04-06 MED ORDER — SODIUM CHLORIDE 0.9 % IV SOLN
8.0000 mg/h | INTRAVENOUS | Status: DC
  Administered 2018-04-06: 8 mg/h via INTRAVENOUS
  Filled 2018-04-06: qty 80

## 2018-04-06 MED ORDER — MIDAZOLAM HCL 2 MG/2ML IJ SOLN
INTRAMUSCULAR | Status: AC
  Administered 2018-04-06: 2 mg
  Filled 2018-04-06: qty 2

## 2018-04-06 MED ORDER — ENSURE ENLIVE PO LIQD: 237.0000 mL | Freq: Two times a day (BID) | ORAL | Status: DC

## 2018-04-06 MED ORDER — ROSUVASTATIN CALCIUM 10 MG PO TABS
10.0000 mg | ORAL_TABLET | Freq: Every day | ORAL | Status: DC
Start: 2018-04-06 — End: 2018-04-07
  Administered 2018-04-06: 10 mg via ORAL
  Filled 2018-04-06 (×2): qty 1

## 2018-04-06 MED ORDER — MAGNESIUM CHLORIDE 64 MG PO TBEC
64.0000 mg | DELAYED_RELEASE_TABLET | Freq: Every day | ORAL | Status: DC
  Administered 2018-04-06: 64 mg via ORAL
  Filled 2018-04-06 (×2): qty 1

## 2018-04-06 MED ORDER — FOLIC ACID 1 MG PO TABS
2.0000 mg | ORAL_TABLET | Freq: Once | ORAL | Status: AC
  Administered 2018-04-06: 2 mg via ORAL
  Filled 2018-04-06 (×2): qty 2

## 2018-04-06 MED ORDER — FENTANYL BOLUS VIA INFUSION
25.0000 ug | INTRAVENOUS | Status: DC | PRN
  Administered 2018-04-06: 25 ug via INTRAVENOUS
  Filled 2018-04-06: qty 25

## 2018-04-06 MED ORDER — PROPOFOL 500 MG/50ML IV EMUL
INTRAVENOUS | Status: AC
  Filled 2018-04-06: qty 50

## 2018-04-06 MED ORDER — ONDANSETRON HCL 4 MG PO TABS: 4.0000 mg | ORAL_TABLET | Freq: Four times a day (QID) | ORAL | Status: DC | PRN

## 2018-04-06 MED ORDER — PIPERACILLIN-TAZOBACTAM 3.375 G IVPB
3.3750 g | Freq: Three times a day (TID) | INTRAVENOUS | Status: DC
  Administered 2018-04-06 – 2018-04-08 (×6): 3.375 g via INTRAVENOUS
  Filled 2018-04-06 (×7): qty 50

## 2018-04-06 MED ORDER — MIDAZOLAM HCL 2 MG/2ML IJ SOLN
2.0000 mg | Freq: Once | INTRAMUSCULAR | Status: AC
  Administered 2018-04-06: 2 mg via INTRAVENOUS

## 2018-04-06 MED ORDER — SELENIUM 200 MCG PO CAPS: ORAL_CAPSULE | Freq: Every day | ORAL | Status: DC

## 2018-04-06 MED ORDER — ACETAMINOPHEN 325 MG PO TABS
650.0000 mg | ORAL_TABLET | Freq: Four times a day (QID) | ORAL | Status: DC | PRN
  Administered 2018-04-11: 650 mg via ORAL

## 2018-04-06 MED ORDER — ZINC SULFATE 220 (50 ZN) MG PO CAPS
220.0000 mg | ORAL_CAPSULE | Freq: Every day | ORAL | Status: DC
  Administered 2018-04-06: 220 mg via ORAL
  Filled 2018-04-06: qty 1

## 2018-04-06 MED ORDER — FOLIC ACID 1 MG PO TABS
1.0000 mg | ORAL_TABLET | Freq: Every day | ORAL | Status: DC
  Filled 2018-04-06: qty 1

## 2018-04-06 MED ORDER — PROPOFOL 10 MG/ML IV BOLUS
INTRAVENOUS | Status: DC | PRN
  Administered 2018-04-06: 40 mg via INTRAVENOUS

## 2018-04-06 MED ORDER — BISACODYL 5 MG PO TBEC: 5.0000 mg | DELAYED_RELEASE_TABLET | Freq: Every day | ORAL | Status: DC | PRN

## 2018-04-06 MED ORDER — METRONIDAZOLE IN NACL 5-0.79 MG/ML-% IV SOLN
500.0000 mg | Freq: Three times a day (TID) | INTRAVENOUS | Status: DC
  Administered 2018-04-06 – 2018-04-07 (×3): 500 mg via INTRAVENOUS
  Filled 2018-04-06 (×8): qty 100

## 2018-04-06 MED FILL — Medication: Qty: 1 | Status: AC

## 2018-04-06 NOTE — Progress Notes (Signed)
Called to patient's room by nurse tech stating that the patient was vomiting up blood. Upon arriving to room post pulling zofran for the patient's complaint of nausea the patient was sitting on the bedside commode with the nurse tech in front of her holding emesis bag. The patient was very pale and lethargic and leaning to the side, when nurse approached patient the patient stated " help me". Nurse noted that patient's ostomy had also filled up with blood. Rapid response was initiated. [facility] and pulse was very faint at brachial site and then nurse felt no pulse. Upon attempting to do chest compressions patient responded with a yell. She remained pale. Patient was bagged during rapid, then blood began coming from her mouth. The patient was intubated and taken to ICU. Earlier during the shift the patient's ostomy bag came off from a large stool that had a large formed piece of stool brown, the rest of the stool was dark green in color and pasty to liquidy in color that had pushed the bag off the site. The patient was cleaned and ostomy re-applied. Dressing to midline was also changed x2.

## 2018-04-06 NOTE — Consult Note (Signed)
Cephas Darby, MD [address]  [place]  [place], [place] [address]  Main: [phone]  Fax: [phone] Pager: [phone]   Consultation  Referring Provider:     No ref. provider found Primary Care Physician:  Guadalupe Maple, MD Primary Gastroenterologist:  Dr. Sherri Sear         Reason for Consultation:     Black stool,acute symptomatic anemia  Date of Admission:  04/05/2018 Date of Consultation:  04/06/2018         HPI:   HALIMA FOGAL is a 67 y.o. female with known history of large duodenal bulb ulcer, perforated sigmoid diverticulitis status post sigmoid colon resection, Hartman's pouch and end colectomy in 02/2018, appendectomy who is admitted with severe symptomatic anemia and reported history of black stools 2 days ago. She reports nausea and vomiting, poor by mouth intake, generalized weakness, right upper quadrant discomfort.overall, her functional status has significantly declined since complicated diverticulitis and underwent surgery. Her hemoglobin on arrival was 5.9, received 2 units of PRBCs, hemoglobin today is 7.3. She does have significant macrocytosis. Her hemoglobin prior to colectomy was 10.4. She had low ferritin levels in 12/2017. She underwent EGD and colonoscopy in 12/2017 which confirmed duodenal bulb ulcer with flat pigmented spot that was cauterized as well as significant sigmoid diverticulosis with narrowing of the lumen and required adult endoscope to complete the colonoscopy. She denies any problems with ostomy and reports having brown stool output of variable consistency. She denies nausea, vomiting or abdominal pain today. She is no longer having melena. She is on Protonix as outpatient. She underwent CT A/P during this admission and was found to have proximal descending colon thickening and antral wall thickening that was previously seen.  She is currently nothing by mouth and on pantoprazole drip GI is consulted for further evaluation. She  also has UTI and started on antibiotics  NSAIDs: none  Antiplts/Anticoagulants/Anti thrombotics: none  GI Procedures:  EGD 01/14/2018 - Normal gastroesophageal junction and esophagus. - Erythematous mucosa in the gastric body and antrum. Biopsied. - One non-obstructing non-bleeding duodenal ulcer with a flat pigmented spot (Forrest Class IIc). Suspicious for NSAID induced etiology. There is no evidence of perforation. Treated with bipolar cautery. - Normal second portion of the duodenum. DIAGNOSIS:  A. STOMACH; COLD BIOPSY:  - OXYNTIC MUCOSA WITH CHANGES CONSISTENT WITH PROTON PUMP INHIBITOR  EFFECT.  - NEGATIVE FOR H. PYLORI, DYSPLASIA, AND MALIGNANCY.   Colonoscopy 01/14/2018 - Preparation of the colon was poor. - The examined portion of the ileum was normal. - Severe diverticulosis in the sigmoid colon. There was narrowing of the colon in association with the diverticular opening. Peri-diverticular erythema was seen. There was no evidence of diverticular bleeding. - Stool in the entire examined colon. - The distal rectum and anal verge are normal on retroflexion view. - No specimens collected.  Past Medical History:  Diagnosis Date  . Allergy   . Anemia   . Arthritis   . Depression   . Goiter   . Hyperlipidemia   . Hypothyroidism   . Osteoporosis     Past Surgical History:  Procedure Laterality Date  . ABDOMINAL HYSTERECTOMY    . COLONOSCOPY WITH PROPOFOL N/A 01/14/2018   Procedure: COLONOSCOPY WITH PROPOFOL;  Surgeon: Lin Landsman, MD;  Location: [facility] ENDOSCOPY;  Service: Gastroenterology;  Laterality: N/A;  . ESOPHAGOGASTRODUODENOSCOPY (EGD) WITH PROPOFOL N/A 01/14/2018   Procedure: ESOPHAGOGASTRODUODENOSCOPY (EGD) WITH PROPOFOL;  Surgeon: Lin Landsman, MD;  Location:  [facility] ENDOSCOPY;  Service: Gastroenterology;  Laterality: N/A;  . LAPAROTOMY N/A 02/25/2018   Procedure: EXPLORATORY LAPAROTOMY, PARTMAN'S PROCEDURE, APPENDECTOMY, COLOSTOMY;  Surgeon:  Hadley Pen, DO;  Location: [facility] ORS;  Service: General;  Laterality: N/A;  . TONSILLECTOMY      Current Facility-Administered Medications:  .  0.9 %  sodium chloride infusion, , Intravenous, Continuous, Amelia Jo, MD, Last Rate: 75 mL/hr at 04/06/18 0339 .  acetaminophen (TYLENOL) tablet 650 mg, 650 mg, Oral, Q6H PRN **OR** acetaminophen (TYLENOL) suppository 650 mg, 650 mg, Rectal, Q6H PRN, Amelia Jo, MD .  bisacodyl (DULCOLAX) EC tablet 5 mg, 5 mg, Oral, Daily PRN, Amelia Jo, MD .  docusate sodium (COLACE) capsule 100 mg, 100 mg, Oral, BID, Amelia Jo, MD, 100 mg at 04/06/18 0917 .  HYDROcodone-acetaminophen (NORCO/VICODIN) 5-325 MG per tablet 1-2 tablet, 1-2 tablet, Oral, Q4H PRN, Amelia Jo, MD, 1 tablet at 04/06/18 1042 .  levothyroxine (SYNTHROID, LEVOTHROID) tablet 25 mcg, 25 mcg, Oral, QAC breakfast, Amelia Jo, MD, 25 mcg at 04/06/18 0835 .  loratadine (CLARITIN) tablet 10 mg, 10 mg, Oral, Daily, Amelia Jo, MD, 10 mg at 04/06/18 0917 .  magnesium chloride (SLOW-MAG) 64 MG SR tablet 64 mg, 64 mg, Oral, Daily, Amelia Jo, MD, 64 mg at 04/06/18 0917 .  metroNIDAZOLE (FLAGYL) IVPB 500 mg, 500 mg, Intravenous, Q8H, Amelia Jo, MD, Stopped at 04/06/18 0730 .  [START ON 04/07/2018] multivitamin-lutein (OCUVITE-LUTEIN) capsule 1 capsule, 1 capsule, Oral, Daily, Dustin Flock, MD .  ondansetron (ZOFRAN) tablet 4 mg, 4 mg, Oral, Q6H PRN **OR** ondansetron (ZOFRAN) injection 4 mg, 4 mg, Intravenous, Q6H PRN, Amelia Jo, MD .  pantoprazole (PROTONIX) 80 mg in sodium chloride 0.9 % 250 mL (0.32 mg/mL) infusion, 8 mg/hr, Intravenous, Continuous, Amelia Jo, MD, Last Rate: 25 mL/hr at 04/05/18 2042, 8 mg/hr at 04/05/18 2042 .  [START ON 04/09/2018] pantoprazole (PROTONIX) injection 40 mg, 40 mg, Intravenous, Q12H, Norman, Anne-Caroline, MD .  piperacillin-tazobactam (ZOSYN) IVPB 3.375 g, 3.375 g, Intravenous, Q8H, Amelia Jo, MD, Last Rate: 12.5 mL/hr at  04/06/18 0339, 3.375 g at 04/06/18 0339 .  rosuvastatin (CRESTOR) tablet 10 mg, 10 mg, Oral, Daily, Amelia Jo, MD, 10 mg at 04/06/18 0917 .  traZODone (DESYREL) tablet 25 mg, 25 mg, Oral, QHS PRN, Amelia Jo, MD .  vitamin E capsule 1,000 Units, 1,000 Units, Oral, BID, Amelia Jo, MD, 1,000 Units at 04/06/18 1610    Family History  Problem Relation Age of Onset  . Cancer Mother   . AAA (abdominal aortic aneurysm) Father   . Cancer Maternal Grandmother   . Stroke Maternal Grandmother   . Cancer Paternal Grandfather      Social History   Tobacco Use  . Smoking status: Current Every Day Smoker    Packs/day: 0.10    Types: Cigarettes  . Smokeless tobacco: Never Used  Substance Use Topics  . Alcohol use: No  . Drug use: No    Allergies as of 04/05/2018 - Review Complete 04/05/2018  Allergen Reaction Noted  . Codeine Diarrhea and Nausea And Vomiting 04/30/2015  . Prednisone Other (See Comments) 12/09/2017    Review of Systems:    All systems reviewed and negative except where noted in HPI.   Physical Exam:  Vital signs in last 24 hours: Temp:  [97.3 F (36.3 C)-98.7 F (37.1 C)] 98.7 F (37.1 C) (07/10 0222) Pulse Rate:  [50-123] 77 (07/10 0621) Resp:  [14-21] 16 (07/10 0222) BP: (79-114)/(44-73) 112/61 (07/10 0621) SpO2:  [75 %-  100 %] 90 % (07/10 0222) Weight:  [85 lb (38.6 kg)-97 lb 11.2 oz (44.3 kg)] 97 lb 11.2 oz (44.3 kg) (07/10 1116) Last BM Date: 04/05/18 General: ill appearing, cooperative in NAD  Head:  Normocephalic and atraumatic, bitemporal wasting. Eyes:   No icterus.   Conjunctiva pale,  PERRLA. Ears:  Normal auditory acuity. Neck:  Supple; no masses or thyroidomegaly Lungs: Respirations even and unlabored. Lungs clear to auscultation bilaterally.   No wheezes, crackles, or rhonchi.  Heart:  Regular rate and rhythm;  Without murmur, clicks, rubs or gallops Abdomen:  Soft, nondistended, nontender. Large gaping vertical open wound in the mid  lower abdomen from recent surgery, dressing in place, nonpurulent. Ostomy in the left mid quadrant, brown stool output. Mild tenderness present, Normal bowel sounds. No appreciable masses or hepatomegaly.  No rebound or guarding.  Rectal:  Not performed. Msk:  Symmetrical without gross deformities. Generalized weakness Extremities:  Without edema, cyanosis or clubbing. Neurologic:  Alert and oriented x3;  grossly normal neurologically. Skin:  Intact without significant lesions or rashes. Psych:  Alert and cooperative. Normal affect.  LAB RESULTS: CBC Latest Ref Rng & Units 04/06/2018 04/05/2018 03/08/2018  WBC 3.6 - 11.0 K/uL 8.1 8.2 6.1  Hemoglobin 12.0 - 16.0 g/dL 7.3(L) 5.9(L) 7.8(L)  Hematocrit 35.0 - 47.0 % 21.1(L) 17.6(L) 22.8(L)  Platelets 150 - 440 K/uL 362 407 719(H)    BMET BMP Latest Ref Rng & Units 04/06/2018 04/05/2018 03/08/2018  Glucose 70 - 99 mg/dL 62(L) 124(H) 85  BUN 8 - 23 mg/dL '20 20 8  '$ Creatinine 0.44 - 1.00 mg/dL 1.31(H) 1.67(H) 0.55  BUN/Creat Ratio 12 - 28 - - -  Sodium 135 - 145 mmol/L 137 136 133(L)  Potassium 3.5 - 5.1 mmol/L 3.5 4.2 3.9  Chloride 98 - 111 mmol/L 105 98 94(L)  CO2 22 - 32 mmol/L '27 30 31  '$ Calcium 8.9 - 10.3 mg/dL 8.3(L) 10.2 7.5(L)    LFT Hepatic Function Latest Ref Rng & Units 04/05/2018 03/08/2018 02/27/2018  Total Protein 6.5 - 8.1 g/dL 5.0(L) 5.3(L) 5.2(L)  Albumin 3.5 - 5.0 g/dL 1.7(L) 1.6(L) 1.8(L)  AST 15 - 41 U/L 32 18 21  ALT 0 - 44 U/L 24 10(L) 11(L)  Alk Phosphatase 38 - 126 U/L 114 75 74  Total Bilirubin 0.3 - 1.2 mg/dL 0.6 0.5 0.7  Bilirubin, Direct 0.0 - 0.2 mg/dL 0.2 - -     STUDIES: Dg Chest 2 View  Result Date: 04/05/2018 CLINICAL DATA:  Chest pain EXAM: CHEST - 2 VIEW COMPARISON:  March 04, 2018 FINDINGS: Nonacute proximal right humeral fracture. The heart, hila, mediastinum, lungs, and pleura are otherwise normal. IMPRESSION: Nonacute proximal right humeral fracture.  No acute abnormality. Electronically Signed   By: Dorise Bullion III M.D   On: 04/05/2018 18:17   Ct Abdomen Pelvis W Contrast  Result Date: 04/05/2018 CLINICAL DATA:  Left upper quadrant abdominal pain with nausea and vomiting. Recent partial colectomy and colostomy on 02/25/2018 for perforated sigmoid diverticulitis. EXAM: CT ABDOMEN AND PELVIS WITH CONTRAST TECHNIQUE: Multidetector CT imaging of the abdomen and pelvis was performed using the standard protocol following bolus administration of intravenous contrast. CONTRAST:  50m OMNIPAQUE IOHEXOL 300 MG/ML  SOLN COMPARISON:  CT abdomen pelvis dated Feb 25, 2018. FINDINGS: Lower chest: No acute abnormality. Hepatobiliary: Diffuse hepatic steatosis. The gallbladder remains mildly distended. No gallbladder wall thickening or gallstones. Unchanged mild dilatation of the common bile duct, measuring up to 10 mm. Pancreas: Unremarkable. No  pancreatic ductal dilatation or surrounding inflammatory changes. Spleen: Normal in size without focal abnormality. Adrenals/Urinary Tract: Adrenal glands are unremarkable. Kidneys are normal, without renal calculi, focal lesion, or hydronephrosis. Bladder is unremarkable. Stomach/Bowel: Postsurgical changes related to sigmoid colectomy with left lower quadrant colostomy. There is moderate circumferential wall thickening of the proximal descending colon extending to the ostomy. The appendix is surgically absent. Improving but persistent wall thickening along the antrum of the stomach and first portion of the duodenum. Fecalization of distal small bowel contents likely represents delayed transit. No bowel obstruction. Vascular/Lymphatic: Aortic atherosclerosis. No enlarged abdominal or pelvic lymph nodes. Reproductive: Status post hysterectomy. No adnexal masses. Other: 2.0 x 2.5 cm rounded area of inflammatory fat stranding in the right mid abdomen. No free fluid or pneumoperitoneum. Mild presacral inflammatory stranding. Open midline abdominal surgical incision. Musculoskeletal: No  acute or significant osseous findings. IMPRESSION: 1. Postsurgical changes related to sigmoid colectomy with left lower quadrant colostomy. Moderate circumferential wall thickening of the proximal descending colon extending to the ostomy, consistent with colitis. 2. Improving but persistent wall thickening along the antrum of the stomach and first portion of the duodenum with adjacent prominent lymph nodes which are slightly increased in size, now measuring up to 8 mm in short axis. Correlation with endoscopy is recommended if not previously performed. 3. Unchanged mild dilatation of the common bile duct and gallbladder distention. Correlate with LFTs and consider further evaluation with ERCP or MRCP as clinically indicated. 4. New 2.0 x 2.5 cm rounded area of inflammatory fat stranding in the right mid abdomen may represent omental infarct. Electronically Signed   By: Titus Dubin M.D.   On: 04/05/2018 21:32      Impression / Plan:   CAILEN TEXEIRA is a 67 y.o. Caucasian female with duodenal bulb ulcer, perforated sigmoid diverticulitis status post- segmental resection, and colectomy and Hartmann's pouch, appendectomy, severe protein calorie malnutrition, macrocytic anemia, admitted with one-day history of black stools and severe symptomatic anemia. Patient responded appropriately to 2 units of PRBCs  Melena, severe symptomatic anemia - Continue pantoprazole drip - Nothing by mouth - EGD today - monitor CBC closely and transfuse as needed  Macrocytic anemia: most likely secondary to malnutrition Check vitamin B12, folate levels Check ferritin, iron studies  Proximal descending wall thickening: Most likely reactive changes from recent surgery  Protein calorie malnutrition: Nutrition consult and patient might benefit from TPN Check prealbumin levels Encourage by mouth intake, add protein shakes  Thank you for involving me in the care of this patient.      LOS: 0 days   Sherri Sear,  MD  04/06/2018, 12:54 PM   Note: This dictation was prepared with Dragon dictation along with smaller phrase technology. Any transcriptional errors that result from this process are unintentional.

## 2018-04-06 NOTE — Op Note (Signed)
[facility] Gastroenterology Patient Name: Annette Miller Procedure Date: 04/06/2018 2:01 PM MRN: [id number] Account #: [id number] Date of Birth: 12/28/1950 Admit Type: Inpatient Age: 67 Room: [facility] ENDO ROOM 2 Gender: Female Note Status: Finalized Procedure:            Upper GI endoscopy Indications:          Acute post hemorrhagic anemia, Melena Providers:            Lin Landsman MD, MD Referring MD:         Guadalupe Maple, MD (Referring MD) Medicines:            Monitored Anesthesia Care Complications:        No immediate complications. Estimated blood loss: None. Procedure:            Pre-Anesthesia Assessment:                       - Prior to the procedure, a History and Physical was                        performed, and patient medications and allergies were                        reviewed. The patient is competent. The risks and                        benefits of the procedure and the sedation options and                        risks were discussed with the patient. All questions                        were answered and informed consent was obtained.                        Patient identification and proposed procedure were                        verified by the physician, the nurse, the                        anesthesiologist, the anesthetist and the technician in                        the pre-procedure area in the procedure room in the                        endoscopy suite. Mental Status Examination: alert and                        oriented. Airway Examination: normal oropharyngeal                        airway and neck mobility. Respiratory Examination:                        clear to auscultation. CV Examination: normal.                        Prophylactic Antibiotics: The patient does not require  prophylactic antibiotics. Prior Anticoagulants: The                        patient has taken no previous anticoagulant or                         antiplatelet agents. ASA Grade Assessment: III - A                        patient with severe systemic disease. After reviewing                        the risks and benefits, the patient was deemed in                        satisfactory condition to undergo the procedure. The                        anesthesia plan was to use monitored anesthesia care                        (MAC). Immediately prior to administration of                        medications, the patient was re-assessed for adequacy                        to receive sedatives. The heart rate, respiratory rate,                        oxygen saturations, blood pressure, adequacy of                        pulmonary ventilation, and response to care were                        monitored throughout the procedure. The physical status                        of the patient was re-assessed after the procedure.                       After obtaining informed consent, the endoscope was                        passed under direct vision. Throughout the procedure,                        the patient's blood pressure, pulse, and oxygen                        saturations were monitored continuously. The Endoscope                        was introduced through the mouth, and advanced to the                        second part of duodenum. The upper GI endoscopy was  accomplished without difficulty. The patient tolerated                        the procedure well. Findings:      One non-bleeding cratered duodenal ulcer with a flat pigmented spot       (Forrest Class IIc) was found in the duodenal bulb. The lesion was 15 mm       in largest dimension.      Clotted blood and coffee ground material was found in the gastric       fundus. Lavage of the area was performed using 50 - 200 mL, resulting in       clearance with adequate visualization. Estimated blood loss: none.      The entire examined stomach was  normal.      The gastroesophageal junction and examined esophagus were normal. Impression:           - One non-bleeding duodenal ulcer with a flat pigmented                        spot (Forrest Class IIc).                       - Clotted blood in the gastric fundus, cleared.                       - Normal stomach.                       - Normal gastroesophageal junction and esophagus.                       - No specimens collected. Recommendation:       - Return patient to hospital ward for ongoing care.                       - Resume regular diet today.                       - Continue present medications.                       - Continue protonix drip for next 72hrs                       - Nutrition consult Procedure Code(s):    --- Professional ---                       [id number], Esophagogastroduodenoscopy, flexible, transoral;                        diagnostic, including collection of specimen(s) by                        brushing or washing, when performed (separate procedure) Diagnosis Code(s):    --- Professional ---                       K26.9, Duodenal ulcer, unspecified as acute or chronic,                        without hemorrhage or perforation  K92.2, Gastrointestinal hemorrhage, unspecified                       D62, Acute posthemorrhagic anemia                       K92.1, Melena (includes Hematochezia) CPT copyright 2017 American Medical Association. All rights reserved. The codes documented in this report are preliminary and upon coder review may  be revised to meet current compliance requirements. Dr. Ulyess Mort Lin Landsman MD, MD 04/06/2018 3:03:42 PM This report has been signed electronically. Number of Addenda: 0 Note Initiated On: 04/06/2018 2:01 PM      [facility]

## 2018-04-06 NOTE — Progress Notes (Signed)
Chaplain responded to Rapid Response to the patient's room on 2C. Chaplain provided energetic prayer and presence during the response. Chaplain called the patient's son, [name], and informed him that his mother was in critical condition and to come to the hospital. Chaplain followed patient to ICU and continued praying for patient and team. Judie GrieveBryan arrived, and Chaplain spoke with him and the patient's brother, Felicity Pellegrinied.  Neither wanted prayer and were emotionally reserved. Chaplain informed the care team of the family's arrival and desire for an update.

## 2018-04-06 NOTE — Progress Notes (Signed)
PHARMACIST - PHYSICIAN ORDER COMMUNICATION  CONCERNING: P&T Medication Policy on Herbal Medications  DESCRIPTION:  This patient's order for:  Selenium  has been noted.  This product(s) is classified as an "herbal" or natural product. Due to a lack of definitive safety studies or FDA approval, nonstandard manufacturing practices, plus the potential risk of unknown drug-drug interactions while on inpatient medications, the Pharmacy and Therapeutics Committee does not permit the use of "herbal" or natural products of this type within [place].   ACTION TAKEN: The pharmacy department is unable to verify this order at this time and your patient has been informed of this safety policy. Please reevaluate patient's clinical condition at discharge and address if the herbal or natural product(s) should be resumed at that time.   

## 2018-04-06 NOTE — Anesthesia Preprocedure Evaluation (Signed)
Anesthesia Evaluation  Patient identified by MRN, date of birth, ID band Patient awake    Reviewed: Allergy & Precautions, H&P , NPO status , reviewed documented beta blocker date and time   Airway Mallampati: II  TM Distance: >3 FB     Dental  (+) Upper Dentures, Lower Dentures   Pulmonary Current Smoker,    Pulmonary exam normal        Cardiovascular Normal cardiovascular exam  Study Conclusions  - Left ventricle: The cavity size was normal. Systolic function was   normal. The estimated ejection fraction was in the range of 50%   to 55%. Wall motion was normal; there were no regional wall   motion abnormalities. Features are consistent with a pseudonormal   left ventricular filling pattern, with concomitant abnormal   relaxation and increased filling pressure (grade 2 diastolic   dysfunction). - Mitral valve: There was mild regurgitation. - Left atrium: The atrium was normal in size. - Right ventricle: Systolic function was normal. - Pulmonary arteries: Systolic pressure was moderatrely elevated PA   peak pressure: 50 mm Hg (S).   Neuro/Psych PSYCHIATRIC DISORDERS Anxiety Depression  Neuromuscular disease    GI/Hepatic PUD,   Endo/Other  Hypothyroidism   Renal/GU Renal disease     Musculoskeletal  (+) Arthritis ,   Abdominal   Peds  Hematology  (+) anemia ,   Anesthesia Other Findings Past Medical History: No date: Allergy No date: Anemia No date: Arthritis No date: Depression No date: Goiter No date: Hyperlipidemia No date: Hypothyroidism No date: Osteoporosis  Past Surgical History: No date: ABDOMINAL HYSTERECTOMY 01/14/2018: COLONOSCOPY WITH PROPOFOL; N/A     Comment:  Procedure: COLONOSCOPY WITH PROPOFOL;  Surgeon: Toney ReilVanga,               Rohini Reddy, MD;  Location: [facility] ENDOSCOPY;  Service:               Gastroenterology;  Laterality: N/A; 01/14/2018: ESOPHAGOGASTRODUODENOSCOPY (EGD) WITH  PROPOFOL; N/A     Comment:  Procedure: ESOPHAGOGASTRODUODENOSCOPY (EGD) WITH               PROPOFOL;  Surgeon: Toney ReilVanga, Rohini Reddy, MD;  Location:               [facility] ENDOSCOPY;  Service: Gastroenterology;  Laterality:               N/A; 02/25/2018: LAPAROTOMY; N/A     Comment:  Procedure: EXPLORATORY LAPAROTOMY, PARTMAN'S PROCEDURE,               APPENDECTOMY, COLOSTOMY;  Surgeon: Star AgeKaplin, Aviva W, DO;                Location: [facility] ORS;  Service: General;  Laterality: N/A; No date: TONSILLECTOMY  BMI    Body Mass Index:  17.87 kg/m      Reproductive/Obstetrics                             Anesthesia Physical Anesthesia Plan  ASA: III  Anesthesia Plan: General   Post-op Pain Management:    Induction:   PONV Risk Score and Plan: 3 and Treatment may vary due to age or medical condition and TIVA  Airway Management Planned:   Additional Equipment:   Intra-op Plan:   Post-operative Plan:   Informed Consent: I have reviewed the patients History and Physical, chart, labs and discussed the procedure including the risks, benefits and alternatives for the  proposed anesthesia with the patient or authorized representative who has indicated his/her understanding and acceptance.   Dental Advisory Given  Plan Discussed with: CRNA  Anesthesia Plan Comments:         Anesthesia Quick Evaluation

## 2018-04-06 NOTE — Anesthesia Post-op Follow-up Note (Signed)
Anesthesia QCDR form completed.        

## 2018-04-06 NOTE — Progress Notes (Signed)
eLink Physician-Brief Progress Note Patient Name: Annette Miller DOB: 01/26/1951 MRN: [id number]   Date of Service  04/06/2018  HPI/Events of Note  Transferred to the ICU s/p resp/ near cardiac arrest following hematemesis. Currently receiving PRBC TX for acute blood loss anemia. Hemodynamically stable at the moment.  eICU Interventions  Reviewed history and labs. Arterial line for closer BP monitoring.        Thomasene Lotkoronkwo U Vinette Crites 04/06/2018, 7:52 PM

## 2018-04-06 NOTE — Anesthesia Procedure Notes (Signed)
Date/Time: 04/06/2018 2:44 PM Performed by: Ginger CarneMichelet, Jacalyn Biggs, CRNA Pre-anesthesia Checklist: Patient identified, Emergency Drugs available, Suction available, Patient being monitored and Timeout performed Patient Re-evaluated:Patient Re-evaluated prior to induction Oxygen Delivery Method: Nasal cannula Preoxygenation: Pre-oxygenation with 100% oxygen

## 2018-04-06 NOTE — ED Provider Notes (Signed)
[facility] Department of Emergency Medicine   Code Blue CONSULT NOTE  Chief Complaint: Cardiac arrest/unresponsive   Level V Caveat: Unresponsive  History of present illness: I was contacted by the hospital for a CODE BLUE cardiac arrest upstairs and presented to the patient's bedside.  Found the patient to be unresponsive very pale in appearance, nurse was able to find a pulse using a Doppler, very faint pulse palpated by myself in the carotid.  Nurse reports the patient is admitted for GI bleed, began vomiting blood and then passed out.  ROS: Unable to obtain, Level V caveat  Scheduled Meds: . sodium chloride   Intravenous Once  . docusate sodium  100 mg Oral BID  . feeding supplement (ENSURE ENLIVE)  237 mL Oral BID BM  . [START ON 04/07/2018] folic acid  1 mg Oral Daily  . levothyroxine  25 mcg Oral QAC breakfast  . loratadine  10 mg Oral Daily  . magnesium chloride  64 mg Oral Daily  . [START ON 04/07/2018] multivitamin-lutein  1 capsule Oral Daily  . [START ON 04/09/2018] pantoprazole  40 mg Intravenous Q12H  . rosuvastatin  10 mg Oral Daily  . vitamin E  1,000 Units Oral BID   Continuous Infusions: . sodium chloride    . metronidazole Stopped (04/06/18 1450)  . pantoprazole (PROTONIX) IVPB    . pantoprozole (PROTONIX) infusion 8 mg/hr (04/05/18 2042)  . pantoprozole (PROTONIX) infusion    . piperacillin-tazobactam (ZOSYN)  IV 3.375 g (04/06/18 1750)  . sodium chloride     PRN Meds:.acetaminophen **OR** acetaminophen, bisacodyl, HYDROcodone-acetaminophen, ondansetron **OR** ondansetron (ZOFRAN) IV, traZODone Past Medical History:  Diagnosis Date  . Allergy   . Anemia   . Arthritis   . Depression   . Goiter   . Hyperlipidemia   . Hypothyroidism   . Osteoporosis    Past Surgical History:  Procedure Laterality Date  . ABDOMINAL HYSTERECTOMY    . COLONOSCOPY WITH PROPOFOL N/A 01/14/2018   Procedure: COLONOSCOPY WITH PROPOFOL;  Surgeon: Toney Reil, MD;  Location: [facility] ENDOSCOPY;  Service: Gastroenterology;  Laterality: N/A;  . ESOPHAGOGASTRODUODENOSCOPY (EGD) WITH PROPOFOL N/A 01/14/2018   Procedure: ESOPHAGOGASTRODUODENOSCOPY (EGD) WITH PROPOFOL;  Surgeon: Toney Reil, MD;  Location: [facility] ENDOSCOPY;  Service: Gastroenterology;  Laterality: N/A;  . LAPAROTOMY N/A 02/25/2018   Procedure: EXPLORATORY LAPAROTOMY, PARTMAN'S PROCEDURE, APPENDECTOMY, COLOSTOMY;  Surgeon: Star Age, DO;  Location: [facility] ORS;  Service: General;  Laterality: N/A;  . TONSILLECTOMY     Social History   Socioeconomic History  . Marital status: Widowed    Spouse name: Not on file  . Number of children: Not on file  . Years of education: Not on file  . Highest education level: Not on file  Occupational History  . Not on file  Social Needs  . Financial resource strain: Not on file  . Food insecurity:    Worry: Not on file    Inability: Not on file  . Transportation needs:    Medical: Not on file    Non-medical: Not on file  Tobacco Use  . Smoking status: Current Every Day Smoker    Packs/day: 0.10    Types: Cigarettes  . Smokeless tobacco: Never Used  Substance and Sexual Activity  . Alcohol use: No  . Drug use: No  . Sexual activity: Not on file  Lifestyle  . Physical activity:    Days per week: Not on file    Minutes per session: Not  on file  . Stress: Not on file  Relationships  . Social connections:    Talks on phone: Not on file    Gets together: Not on file    Attends religious service: Not on file    Active member of club or organization: Not on file    Attends meetings of clubs or organizations: Not on file    Relationship status: Not on file  . Intimate partner violence:    Fear of current or ex partner: Not on file    Emotionally abused: Not on file    Physically abused: Not on file    Forced sexual activity: Not on file  Other Topics Concern  . Not on file  Social History Narrative  . Not on file    Allergies  Allergen Reactions  . Codeine Diarrhea and Nausea And Vomiting  . Prednisone Other (See Comments)    Reaction: violence    Last set of Vital Signs (not current) Vitals:   04/06/18 1526 04/06/18 1536  BP: 96/64 110/67  Pulse: 78 74  Resp: 17 13  Temp:    SpO2: 100% 99%      Physical Exam Gen: unresponsive Cardiovascular: Faint pulse Resp: apneic. Breath sounds equal bilaterally with bagging  Abd: nondistended colostomy present with black output. Neuro: GCS 3, unresponsive to pain  HEENT: Blood noted around oropharynx. Neck: No crepitus  Musculoskeletal: No deformity  Skin: warm  Procedures  INTUBATION Performed by: Minna Antis Required items: required blood products, implants, devices, and special equipment available Patient identity confirmed: provided demographic data and hospital-assigned identification number Time out: Immediately prior to procedure a "time out" was called to verify the correct patient, procedure, equipment, support staff and site/side marked as required. Indications: GI bleed, airway management, respiratory failure Intubation method: Direct laryngoscopy Preoxygenation: 100% BVM Sedatives: 10 mg of etomidate Paralytic: 80 mg of succinylcholine Tube Size: 7.0 cuffed Post-procedure assessment: chest rise and ETCO2 monitor Breath sounds: equal and absent over the epigastrium Tube secured by Respiratory Therapy Patient tolerated the procedure well with no immediate complications.  CRITICAL CARE Performed by: Minna Antis Total critical care time: 20 Critical care time was exclusive of separately billable procedures and treating other patients. Critical care was necessary to treat or prevent imminent or life-threatening deterioration. Critical care was time spent personally by me on the following activities: development of treatment plan with patient and/or surrogate as well as nursing, discussions with consultants,  evaluation of patient's response to treatment, examination of patient, obtaining history from patient or surrogate, ordering and performing treatments and interventions, ordering and review of laboratory studies, ordering and review of radiographic studies, pulse oximetry and re-evaluation of patient's condition.  Medical Decision making  I was called to the patient's room for CODE BLUE.  Per report patient admitted for GI bleed, began vomiting blood and lost consciousness.  Upon my arrival patient had very faint pulse, very pale in appearance.  Did not completely lose a pulse, CPR was not started.  However due to respiratory failure and airway protection I made the decision to intubate the patient.  Patient was minimally responsive just prior to intubation.  Patient was intubated by myself with direct laryngoscopy, no complications.  Patient's blood pressure on recheck was 105 systolic.  Patient maintain a rhythm of sinus tachycardia around 110 bpm.  Patient did have GI bleeding from the esophagus during intubation.  No blood in the ET tube after successful intubation.  Equal breath sounds bilaterally.  Patient care  signed out to hospitalist.  Assessment and Plan  GI bleed Respiratory failure Unresponsiveness    Minna AntisPaduchowski, Isaly Fasching, MD 04/06/18 [phone]

## 2018-04-06 NOTE — Progress Notes (Signed)
Initial Nutrition Assessment  DOCUMENTATION CODES:   Severe malnutrition in context of acute illness/injury  INTERVENTION:   RD will order supplements when diet advanced  Recommend check zinc, B12, folate, copper, ceruloplasmin, iron, TIBC, ferritin labs   Pt at high refeeding risk; recommend monitor K, MG, and P labs.   NUTRITION DIAGNOSIS:   Severe Malnutrition related to acute illness as evidenced by 12 percent weight loss in 5 weeks, moderate fat depletion, severe muscle depletion.  GOAL:   Patient will meet greater than or equal to 90% of their needs  MONITOR:   Diet advancement, Labs, Weight trends, Skin, I & O's  REASON FOR ASSESSMENT:   Malnutrition Screening Tool    ASSESSMENT:    67 y.o. female with a history of perforated sigmoid diverticulitis status post status post partial colectomy with colostomy 02/25/2018 presenting for lightheadedness with standing, nausea and vomiting, chest pain, and left upper quadrant pain. Pt found to have colitis and sepsis '  Met with pt in room today. RD familiar with this pt from recent previous admit. Pt with poor appetite and oral intake at baseline. Pt reports continued poor appetite since discharge and reports that she has only eaten jello and applesauce for 1 week. Pt reports nausea today. Pt is currently NPO. Per chart, pt has lost 13lbs(12%) in 5 weeks; this is severe weight loss. Pt has now developed malnutrition. Pt noted to have macrocytic anemia. Pt reports weakness in her legs today and unable to stand. Pt noted to be on long term zinc supplementation which can deplete copper levels. Recommend check iron/anemia labs, copper, ceruloplasmin and zinc labs. Pt does not like supplements but is willing to try vital cuisine and magic cups when diet advanced. Pt at high refeeding risk; recommend monitor K, MG, and P labs.   Medications reviewed and include: colace, synthroid, Mg chloride, vit E, zinc, NaCl '@75ml'$ /hr, metronidazole,  protonix, zosyn  Labs reviewed: Hgb 7.3(L), Hct 21.1(L), MCV 102.6(H), MCH 35.5(H)  NUTRITION - FOCUSED PHYSICAL EXAM:    Most Recent Value  Orbital Region  No depletion  Upper Arm Region  Moderate depletion  Thoracic and Lumbar Region  Severe depletion  Buccal Region  Mild depletion  Temple Region  Mild depletion  Clavicle Bone Region  Mild depletion  Clavicle and Acromion Bone Region  Mild depletion  Scapular Bone Region  Mild depletion  Dorsal Hand  Severe depletion  Patellar Region  Severe depletion  Anterior Thigh Region  Severe depletion  Posterior Calf Region  Severe depletion  Edema (RD Assessment)  None  Hair  Reviewed  Eyes  Reviewed  Mouth  Reviewed  Skin  Reviewed  Nails  Reviewed     Diet Order:   Diet Order           Diet NPO time specified Except for: Sips with Meds  Diet effective now         EDUCATION NEEDS:   Education needs have been addressed  Skin:  Skin Assessment: Reviewed RN Assessment(closed incision abdomen )  Last BM:  7/9  Height:   Ht Readings from Last 1 Encounters:  04/05/18 '5\' 2"'$  (1.575 m)    Weight:   Wt Readings from Last 1 Encounters:  04/06/18 97 lb 11.2 oz (44.3 kg)    Ideal Body Weight:  50 kg  BMI:  Body mass index is 17.87 kg/m.  Estimated Nutritional Needs:   Kcal:  1200-1400kcal/day   Protein:  66-75g/day   Fluid:  >1.2L/day  Mariaisabel Bodiford MS, RD, LDN Pager #- [phone] Office#- [phone] After Hours Pager: [phone]  

## 2018-04-06 NOTE — Progress Notes (Signed)
ANTIBIOTIC CONSULT NOTE - INITIAL  Pharmacy Consult for Zosyn  Indication: intra-abdominal  Allergies  Allergen Reactions  . Codeine Diarrhea and Nausea And Vomiting  . Prednisone Other (See Comments)    Reaction: violence    Patient Measurements: Height: 5\' 2"  (157.5 cm) Weight: 85 lb (38.6 kg) IBW/kg (Calculated) : 50.1 Adjusted Body Weight:   Vital Signs: Temp: 97.6 F (36.4 C) (07/09 2350) Temp Source: Oral (07/09 2350) BP: 98/67 (07/10 0030) Pulse Rate: 70 (07/10 0030) Intake/Output from previous day: 07/09 0701 - 07/10 0700 In: 4470 [I.V.:1010; Blood:360; IV Piggyback:3100] Out: 100 [Urine:100] Intake/Output from this shift: Total I/O In: 4470 [I.V.:1010; Blood:360; IV Piggyback:3100] Out: 100 [Urine:100]  Labs: Recent Labs    04/05/18 1908  WBC 8.2  HGB 5.9*  PLT 407  CREATININE 1.67*   Estimated Creatinine Clearance: 20.2 mL/min (A) (by C-G formula based on SCr of 1.67 mg/dL (H)). No results for input(s): VANCOTROUGH, VANCOPEAK, VANCORANDOM, GENTTROUGH, GENTPEAK, GENTRANDOM, TOBRATROUGH, TOBRAPEAK, TOBRARND, AMIKACINPEAK, AMIKACINTROU, AMIKACIN in the last 72 hours.   Microbiology: No results found for this or any previous visit (from the past 720 hour(s)).  Medical History: Past Medical History:  Diagnosis Date  . Allergy   . Anemia   . Arthritis   . Depression   . Goiter   . Hyperlipidemia   . Hypothyroidism   . Osteoporosis     Medications:   (Not in a hospital admission) Assessment: CrCl = 20.2 ml/min   Goal of Therapy:  resolution of infection  Plan:  Expected duration 7 days with resolution of temperature and/or normalization of WBC   Zosyn 3.375 gm IV X 1 given in ED on 7/9 @ 1922. Zosyn 3.375 gm IV Q8H EI ordered to start on 7/10 @ 0100.   Shandi Godfrey D 04/06/2018,12:46 AM

## 2018-04-06 NOTE — Telephone Encounter (Signed)
Called and left a detailed message with verbal orders given.

## 2018-04-06 NOTE — ED Notes (Addendum)
Date and time results received: 04/06/18 2024   Test: Lactic Acid/Troponin Critical Value: 3.4/0.03  Name of Provider Notified: Dr. Sharma CovertNorman  Orders Received? Or Actions Taken?: Acknowledged.

## 2018-04-06 NOTE — Progress Notes (Signed)
Advanced care plan.  Purpose of the Encounter: CODE STATUS  Parties in Attendance: Patient herself  Patient's Decision Capacity: Intact  Subjective/Patient's story: Patient is a 67 year old with history of hypothyroidism hyperlipidemia depression with history of laparotomy presenting with sepsis    Objective/Medical story I discussed with the patient regarding her desire to be resuscitated from a cardiac standpoint and pulmonary standpoint   Goals of care determination:  Full code   CODE STATUS: Full code   Time spent discussing advanced care planning: 16 minutes

## 2018-04-06 NOTE — H&P (Signed)
[facility] - [place] [facility]   PATIENT NAME: Annette Miller    MR#:  [id number]  DATE OF BIRTH:  01/20/1951  DATE OF ADMISSION:  04/05/2018  PRIMARY CARE PHYSICIAN: Steele Sizer, MD   REQUESTING/REFERRING PHYSICIAN:   CHIEF COMPLAINT:   Chief Complaint  Patient presents with  . Chest Pain  . Nausea  . Emesis  . Weakness    HISTORY OF PRESENT ILLNESS: Annette Miller  is a 67 y.o. female with a known history of hyperlipidemia, chronic anemia, osteoporosis and hypothyroidism.  Patient is status post recent partial colectomy and ostomy for perforated sigmoid diverticulitis. She was brought to emergency room for severe generalized weakness and lightheadedness going on for the past 2 to 3 days, gradually getting worse.  She also complains of nausea vomiting and left upper quadrant abdominal pain X24  Hrs. Per patient, the surgical wound has been healing well, no purulent drainage was noted.  Her ostomy pouch has been putting out normal stool, although she recalls black stools 2 days ago.  She denies having fever or chills.  She has been using more ibuprofen and meloxicam in the past few days due to arthritis pain.  She continues to smoke daily. Blood test done emergency room are remarkable for hemoglobin level of 5.9, creatinine level 1.67.  Lactic acid level is 2.3.  UA is positive for UTI. CT scan of the abdomen reveals moderate circumferential wall thickening of the proximal descending colon extending to the ostomy, consistent with colitis. She is admitted for further evaluation and treatment.  PAST MEDICAL HISTORY:   Past Medical History:  Diagnosis Date  . Allergy   . Anemia   . Arthritis   . Depression   . Goiter   . Hyperlipidemia   . Hypothyroidism   . Osteoporosis     PAST SURGICAL HISTORY:  Past Surgical History:  Procedure Laterality Date  . ABDOMINAL HYSTERECTOMY    . COLONOSCOPY WITH PROPOFOL N/A 01/14/2018   Procedure: COLONOSCOPY  WITH PROPOFOL;  Surgeon: Toney Reil, MD;  Location: [facility] ENDOSCOPY;  Service: Gastroenterology;  Laterality: N/A;  . ESOPHAGOGASTRODUODENOSCOPY (EGD) WITH PROPOFOL N/A 01/14/2018   Procedure: ESOPHAGOGASTRODUODENOSCOPY (EGD) WITH PROPOFOL;  Surgeon: Toney Reil, MD;  Location: [facility] ENDOSCOPY;  Service: Gastroenterology;  Laterality: N/A;  . LAPAROTOMY N/A 02/25/2018   Procedure: EXPLORATORY LAPAROTOMY, PARTMAN'S PROCEDURE, APPENDECTOMY, COLOSTOMY;  Surgeon: Star Age, DO;  Location: [facility] ORS;  Service: General;  Laterality: N/A;  . TONSILLECTOMY      SOCIAL HISTORY:  Social History   Tobacco Use  . Smoking status: Current Every Day Smoker    Packs/day: 0.10    Types: Cigarettes  . Smokeless tobacco: Never Used  Substance Use Topics  . Alcohol use: No    FAMILY HISTORY:  Family History  Problem Relation Age of Onset  . Cancer Mother   . AAA (abdominal aortic aneurysm) Father   . Cancer Maternal Grandmother   . Stroke Maternal Grandmother   . Cancer Paternal Grandfather     DRUG ALLERGIES:  Allergies  Allergen Reactions  . Codeine Diarrhea and Nausea And Vomiting  . Prednisone Other (See Comments)    Reaction: violence    REVIEW OF SYSTEMS:   CONSTITUTIONAL: No fever, positive for severe fatigue and generalized weakness.  EYES: No blurred or double vision.  EARS, NOSE, AND THROAT: No tinnitus or ear pain.  RESPIRATORY: No cough, shortness of breath, wheezing or hemoptysis.  CARDIOVASCULAR: No chest pain, orthopnea,  edema.  GASTROINTESTINAL: Positive for nausea, vomiting, abdominal pain.  No diarrhea.  Positive for black stools. GENITOURINARY: No dysuria, hematuria.  ENDOCRINE: No polyuria, nocturia,  HEMATOLOGY: Positive history of chronic anemia. SKIN: No rash or lesion. MUSCULOSKELETAL: [place] for osteoarthritis.   NEUROLOGIC: No focal weakness.  PSYCHIATRY: No anxiety or depression.   MEDICATIONS AT HOME:  Prior to Admission medications    Medication Sig Start Date End Date Taking? Authorizing Provider  acetaminophen (TYLENOL) 325 MG tablet Take 650 mg by mouth every 6 (six) hours as needed for mild pain or fever.   Yes [provider]  Ascorbic Acid (VITAMIN C) 1000 MG tablet Take 3,000 mg by mouth daily.   Yes [provider]  Calcium Carb-Cholecalciferol (OYSTER SHELL CALCIUM PLUS D PO) Take 1 tablet by mouth daily.    Yes [provider]  diazepam (VALIUM) 5 MG tablet TAKE 1 TABLET BY MOUTH TWICE A DAY 11/04/17  Yes Crissman, Redge Gainer, MD  levothyroxine (SYNTHROID, LEVOTHROID) 25 MCG tablet Take 1 tablet (25 mcg total) by mouth daily. 05/20/17  Yes Crissman, Redge Gainer, MD  loratadine (CLARITIN) 10 MG tablet Take 1 tablet (10 mg total) by mouth daily. 05/20/17  Yes Crissman, Redge Gainer, MD  magnesium 30 MG tablet Take 30 mg by mouth daily.    Yes [provider]  meloxicam (MOBIC) 15 MG tablet Take 1 tablet (15 mg total) by mouth daily. 12/31/17  Yes Johnson, Megan P, DO  ondansetron (ZOFRAN) 4 MG tablet TAKE 1 TABLET BY MOUTH EVERY 8 HOURS AS NEEDED FOR NAUSEA AND VOMITING 02/25/18  Yes Gabriel Cirri, NP  pantoprazole (PROTONIX) 40 MG tablet Take 1 tablet (40 mg total) by mouth 2 (two) times daily. 12/15/17  Yes Crissman, Redge Gainer, MD  rosuvastatin (CRESTOR) 10 MG tablet Take 1 tablet (10 mg total) by mouth daily. 05/20/17  Yes Steele Sizer, MD  Selenium 200 MCG CAPS Take by mouth daily.   Yes [provider]  vitamin E 1000 UNIT capsule Take 1,000 Units by mouth 2 (two) times daily.   Yes [provider]  Zinc Sulfate (ZINC 15 PO) Take by mouth daily.   Yes [provider]  buPROPion (WELLBUTRIN SR) 150 MG 12 hr tablet TAKE 1 TABLET (150 MG TOTAL) BY MOUTH 2 (TWO) TIMES DAILY. Patient not taking: Reported on 04/05/2018 06/14/17   Olevia Perches P, DO  fluticasone (FLONASE) 50 MCG/ACT nasal spray Place 2 sprays into both nostrils daily. Patient not taking: Reported on 04/05/2018  05/20/17   Steele Sizer, MD  ondansetron (ZOFRAN ODT) 4 MG disintegrating tablet Take 1 tablet (4 mg total) by mouth every 8 (eight) hours as needed for nausea or vomiting. Patient not taking: Reported on 04/05/2018 12/09/17   Darci Current, MD  oxyCODONE (OXY IR/ROXICODONE) 5 MG immediate release tablet Take 1 tablet (5 mg total) by mouth every 4 (four) hours as needed for severe pain. Patient not taking: Reported on 04/05/2018 03/12/18   Ancil Linsey, MD  potassium chloride (K-DUR) 10 MEQ tablet Take 4 tablets (40 mEq total) by mouth daily for 2 days. Patient not taking: Reported on 04/05/2018 01/13/18 01/15/18  Toney Reil, MD  sucralfate (CARAFATE) 1 g tablet Take 1 tablet (1 g total) by mouth 2 (two) times daily. Patient not taking: Reported on 04/05/2018 12/09/17 01/08/18  Darci Current, MD      PHYSICAL EXAMINATION:   VITAL SIGNS: Blood pressure 111/67, pulse 66, temperature 97.6 F (36.4  C), temperature source Oral, resp. rate 18, height 5\' 2"  (1.575 m), weight 38.6 kg (85 lb), SpO2 100 %.  GENERAL:  67 y.o.-year-old patient lying in the bed.  She looks very pale, exhausted. EYES: Pupils equal, round, reactive to light and accommodation. No scleral icterus. Extraocular muscles intact.  HEENT: Head atraumatic, normocephalic. Oropharynx and nasopharynx clear.  NECK:  Supple, no jugular venous distention. No thyroid enlargement, no tenderness.  LUNGS: Reduced breath sounds bilaterally, no wheezing. No use of accessory muscles of respiration.  CARDIOVASCULAR: S1, S2 normal. No murmurs, rubs, or gallops.  ABDOMEN: Soft, nondistended.  There is tenderness with palpation in the left upper quadrant.  There is a large central surgical wound, noted with normal granulation tissue and no evidence of surrounding erythema, fluctuance or abnormal discharge.  Bowel sounds present. No organomegaly or mass.  Colostomy bag is in place in the left lower quadrant, currently empty. EXTREMITIES:  No pedal edema, cyanosis, or clubbing.  NEUROLOGIC: No focal weakness. PSYCHIATRIC: The patient is alert and oriented x 3.  SKIN: Patient is very pale.  No obvious rash, lesion, or ulcer.   LABORATORY PANEL:   CBC Recent Labs  Lab 04/05/18 1908  WBC 8.2  HGB 5.9*  HCT 17.6*  PLT 407  MCV 108.6*  MCH 36.7*  MCHC 33.8  RDW 24.2*   ------------------------------------------------------------------------------------------------------------------  Chemistries  Recent Labs  Lab 04/05/18 1908 04/05/18 1909  NA 136  --   K 4.2  --   CL 98  --   CO2 30  --   GLUCOSE 124*  --   BUN 20  --   CREATININE 1.67*  --   CALCIUM 10.2  --   AST  --  32  ALT  --  24  ALKPHOS  --  114  BILITOT  --  0.6   ------------------------------------------------------------------------------------------------------------------ estimated creatinine clearance is 20.2 mL/min (A) (by C-G formula based on SCr of 1.67 mg/dL (H)). ------------------------------------------------------------------------------------------------------------------ No results for input(s): TSH, T4TOTAL, T3FREE, THYROIDAB in the last 72 hours.  Invalid input(s): FREET3   Coagulation profile No results for input(s): INR, PROTIME in the last 168 hours. ------------------------------------------------------------------------------------------------------------------- No results for input(s): DDIMER in the last 72 hours. -------------------------------------------------------------------------------------------------------------------  Cardiac Enzymes Recent Labs  Lab 04/05/18 1908 04/05/18 2211  TROPONINI 0.03* 0.03*   ------------------------------------------------------------------------------------------------------------------ Invalid input(s): POCBNP  ---------------------------------------------------------------------------------------------------------------  Urinalysis    Component Value Date/Time    COLORURINE AMBER (A) 04/05/2018 1908   APPEARANCEUR HAZY (A) 04/05/2018 1908   APPEARANCEUR Clear 05/20/2017 1023   LABSPEC 1.016 04/05/2018 1908   PHURINE 5.0 04/05/2018 1908   GLUCOSEU NEGATIVE 04/05/2018 1908   HGBUR NEGATIVE 04/05/2018 1908   BILIRUBINUR NEGATIVE 04/05/2018 1908   BILIRUBINUR Negative 05/20/2017 1023   KETONESUR NEGATIVE 04/05/2018 1908   PROTEINUR 30 (A) 04/05/2018 1908   NITRITE NEGATIVE 04/05/2018 1908   LEUKOCYTESUR NEGATIVE 04/05/2018 1908   LEUKOCYTESUR Negative 05/20/2017 1023     RADIOLOGY: Dg Chest 2 View  Result Date: 04/05/2018 CLINICAL DATA:  Chest pain EXAM: CHEST - 2 VIEW COMPARISON:  March 04, 2018 FINDINGS: Nonacute proximal right humeral fracture. The heart, hila, mediastinum, lungs, and pleura are otherwise normal. IMPRESSION: Nonacute proximal right humeral fracture.  No acute abnormality. Electronically Signed   By: Gerome Sam III M.D   On: 04/05/2018 18:17   Ct Abdomen Pelvis W Contrast  Result Date: 04/05/2018 CLINICAL DATA:  Left upper quadrant abdominal pain with nausea and vomiting. Recent partial colectomy and colostomy on 02/25/2018 for perforated  sigmoid diverticulitis. EXAM: CT ABDOMEN AND PELVIS WITH CONTRAST TECHNIQUE: Multidetector CT imaging of the abdomen and pelvis was performed using the standard protocol following bolus administration of intravenous contrast. CONTRAST:  75mL OMNIPAQUE IOHEXOL 300 MG/ML  SOLN COMPARISON:  CT abdomen pelvis dated Feb 25, 2018. FINDINGS: Lower chest: No acute abnormality. Hepatobiliary: Diffuse hepatic steatosis. The gallbladder remains mildly distended. No gallbladder wall thickening or gallstones. Unchanged mild dilatation of the common bile duct, measuring up to 10 mm. Pancreas: Unremarkable. No pancreatic ductal dilatation or surrounding inflammatory changes. Spleen: Normal in size without focal abnormality. Adrenals/Urinary Tract: Adrenal glands are unremarkable. Kidneys are normal, without renal  calculi, focal lesion, or hydronephrosis. Bladder is unremarkable. Stomach/Bowel: Postsurgical changes related to sigmoid colectomy with left lower quadrant colostomy. There is moderate circumferential wall thickening of the proximal descending colon extending to the ostomy. The appendix is surgically absent. Improving but persistent wall thickening along the antrum of the stomach and first portion of the duodenum. Fecalization of distal small bowel contents likely represents delayed transit. No bowel obstruction. Vascular/Lymphatic: Aortic atherosclerosis. No enlarged abdominal or pelvic lymph nodes. Reproductive: Status post hysterectomy. No adnexal masses. Other: 2.0 x 2.5 cm rounded area of inflammatory fat stranding in the right mid abdomen. No free fluid or pneumoperitoneum. Mild presacral inflammatory stranding. Open midline abdominal surgical incision. Musculoskeletal: No acute or significant osseous findings. IMPRESSION: 1. Postsurgical changes related to sigmoid colectomy with left lower quadrant colostomy. Moderate circumferential wall thickening of the proximal descending colon extending to the ostomy, consistent with colitis. 2. Improving but persistent wall thickening along the antrum of the stomach and first portion of the duodenum with adjacent prominent lymph nodes which are slightly increased in size, now measuring up to 8 mm in short axis. Correlation with endoscopy is recommended if not previously performed. 3. Unchanged mild dilatation of the common bile duct and gallbladder distention. Correlate with LFTs and consider further evaluation with ERCP or MRCP as clinically indicated. 4. New 2.0 x 2.5 cm rounded area of inflammatory fat stranding in the right mid abdomen may represent omental infarct. Electronically Signed   By: Obie DredgeWilliam T Derry M.D.   On: 04/05/2018 21:32    EKG: Orders placed or performed during the hospital encounter of 04/05/18  . EKG 12-Lead  . EKG 12-Lead  . ED EKG within  10 minutes  . ED EKG within 10 minutes    IMPRESSION AND PLAN:  1.  Sepsis, likely secondary to intra-abdominal infection.  We will start IV fluids and IV antibiotics Zosyn and Flagyl.  Follow lactic acid level. 2.  Acute colitis, will start Zosyn and Flagyl IV.  Will check stool sample for C. difficile infection. 3.  Upper GI bleed.  We will start PPI treatment.  Will avoid anticoagulants and NSAIDs.  Gastroenterology is consulted for further evaluation and treatment. 4.  Acute UTI.  On Zosyn IV while waiting for urine culture result. 5.  Severe anemia, likely multifactorial, related to chronic anemia, worsened by acute blood loss during the recent surgery, GI bleed and acute intra-abdominal infection.  Hemoglobin level is 5.9.  Start transfusion with 1 unit packed RBC.  Continue to monitor hemoglobin level closely. 6.  Acute renal failure, likely prerenal.  We will start IV fluids and blood transfusion.  Will monitor kidney function closely and avoid nephrotoxic medications. 7.  Tobacco abuse.  Cocaine cessation was discussed with patient in detail.  All the records are reviewed and case discussed with ED provider. Management plans discussed with  the patient, who is in agreement.  CODE STATUS: Full Code Status History    Date Active Date Inactive Code Status Order ID Comments User Context   02/25/2018 1326 03/12/2018 1730 Full Code [id number]  Star Age, DO Inpatient       TOTAL TIME TAKING CARE OF THIS PATIENT: 50  minutes.    Cammy Copa M.D on 04/06/2018 at 12:32 AM  Between 7am to 6pm - Pager - [phone]  After 6pm go to [email or web address] - password [email or web address] [facility]  [place] [facility]  Office  [phone]  CC: Primary care physician; Steele Sizer, MD

## 2018-04-06 NOTE — Plan of Care (Signed)

## 2018-04-06 NOTE — Consult Note (Addendum)
Patient ID: Annette Miller, female   DOB: Oct 06, 1950, 67 y.o.   MRN: [id number]  HPI Annette Miller is a 67 y.o. female known to our service with a history of a Hartman's procedure for perforated diverticulitis 6 weeks ago presenting now with failure to thrive, generalized weakness, lightheadedness for the last few days.  Apparently she also had some nausea and vomiting.  Colostomy is working well.  No fevers no chills.  Of note she also reports using more NSAIDs recently. CT scan personally reviewed showing no evidence of free air or abscess.  There is some thickening in the antrum and also some thickening in the descending colon. Her hemoglobin was 5.9 1.6.  Her creatinine slightly increased. We were asked to see in consultation by Dr. Elba Miller. He has been admitted to hospital received 2 units of blood with significant improvement of her symptoms.  HPI  Past Medical History:  Diagnosis Date  . Allergy   . Anemia   . Arthritis   . Depression   . Goiter   . Hyperlipidemia   . Hypothyroidism   . Osteoporosis     Past Surgical History:  Procedure Laterality Date  . ABDOMINAL HYSTERECTOMY    . COLONOSCOPY WITH PROPOFOL N/A 01/14/2018   Procedure: COLONOSCOPY WITH PROPOFOL;  Surgeon: Toney Reil, MD;  Location: [facility] ENDOSCOPY;  Service: Gastroenterology;  Laterality: N/A;  . ESOPHAGOGASTRODUODENOSCOPY (EGD) WITH PROPOFOL N/A 01/14/2018   Procedure: ESOPHAGOGASTRODUODENOSCOPY (EGD) WITH PROPOFOL;  Surgeon: Toney Reil, MD;  Location: [facility] ENDOSCOPY;  Service: Gastroenterology;  Laterality: N/A;  . LAPAROTOMY N/A 02/25/2018   Procedure: EXPLORATORY LAPAROTOMY, PARTMAN'S PROCEDURE, APPENDECTOMY, COLOSTOMY;  Surgeon: Star Age, DO;  Location: [facility] ORS;  Service: General;  Laterality: N/A;  . TONSILLECTOMY      Family History  Problem Relation Age of Onset  . Cancer Mother   . AAA (abdominal aortic aneurysm) Father   . Cancer Maternal Grandmother   . Stroke Maternal  Grandmother   . Cancer Paternal Grandfather     Social History Social History   Tobacco Use  . Smoking status: Current Every Day Smoker    Packs/day: 0.10    Types: Cigarettes  . Smokeless tobacco: Never Used  Substance Use Topics  . Alcohol use: No  . Drug use: No    Allergies  Allergen Reactions  . Codeine Diarrhea and Nausea And Vomiting  . Prednisone Other (See Comments)    Reaction: violence    Current Facility-Administered Medications  Medication Dose Route Frequency Provider Last Rate Last Dose  . 0.9 %  sodium chloride infusion   Intravenous Continuous Cammy Copa, MD 75 mL/hr at 04/06/18 [phone]    . acetaminophen (TYLENOL) tablet 650 mg  650 mg Oral Q6H PRN Cammy Copa, MD       Or  . acetaminophen (TYLENOL) suppository 650 mg  650 mg Rectal Q6H PRN Cammy Copa, MD      . bisacodyl (DULCOLAX) EC tablet 5 mg  5 mg Oral Daily PRN Cammy Copa, MD      . docusate sodium (COLACE) capsule 100 mg  100 mg Oral BID Cammy Copa, MD   100 mg at 04/06/18 0917  . HYDROcodone-acetaminophen (NORCO/VICODIN) 5-325 MG per tablet 1-2 tablet  1-2 tablet Oral Q4H PRN Cammy Copa, MD   1 tablet at 04/06/18 1042  . levothyroxine (SYNTHROID, LEVOTHROID) tablet 25 mcg  25 mcg Oral QAC breakfast Cammy Copa, MD   25 mcg at 04/06/18 0835  . loratadine (CLARITIN)  tablet 10 mg  10 mg Oral Daily Cammy Copa, MD   10 mg at 04/06/18 0917  . magnesium chloride (SLOW-MAG) 64 MG SR tablet 64 mg  64 mg Oral Daily Cammy Copa, MD   64 mg at 04/06/18 0917  . metroNIDAZOLE (FLAGYL) IVPB 500 mg  500 mg Intravenous Q8H Cammy Copa, MD   Stopped at 04/06/18 0730  . [START ON 04/07/2018] multivitamin-lutein (OCUVITE-LUTEIN) capsule 1 capsule  1 capsule Oral Daily Auburn Bilberry, MD      . ondansetron [facility]) tablet 4 mg  4 mg Oral Q6H PRN Cammy Copa, MD       Or  . ondansetron [facility]) injection 4 mg  4 mg Intravenous Q6H PRN Cammy Copa, MD      . pantoprazole (PROTONIX) 80 mg in  sodium chloride 0.9 % 250 mL (0.32 mg/mL) infusion  8 mg/hr Intravenous Continuous Cammy Copa, MD 25 mL/hr at 04/05/18 2042 8 mg/hr at 04/05/18 2042  . [START ON 04/09/2018] pantoprazole (PROTONIX) injection 40 mg  40 mg Intravenous Q12H Rockne Menghini, MD      . piperacillin-tazobactam (ZOSYN) IVPB 3.375 g  3.375 g Intravenous Roxanna Mew, MD 12.5 mL/hr at 04/06/18 0339 3.375 g at 04/06/18 0339  . rosuvastatin (CRESTOR) tablet 10 mg  10 mg Oral Daily Cammy Copa, MD   10 mg at 04/06/18 0917  . traZODone (DESYREL) tablet 25 mg  25 mg Oral QHS PRN Cammy Copa, MD      . vitamin E capsule 1,000 Units  1,000 Units Oral BID Cammy Copa, MD   1,000 Units at 04/06/18 2956     Review of Systems Full ROS  was asked and was negative except for the information on the HPI  Physical Exam Blood pressure 112/61, pulse 77, temperature 98.7 F (37.1 C), temperature source Oral, resp. rate 16, height 5\' 2"  (1.575 m), weight 44.3 kg (97 lb 11.2 oz), SpO2 90 %. CONSTITUTIONAL: debilitated female  EYES: Pupils are equal, round, and reactive to light, Sclera are non-icteric. EARS, NOSE, MOUTH AND THROAT: The oropharynx is clear. The oral mucosa is pink and moist. Hearing is intact to voice. LYMPH NODES:  Lymph nodes in the neck are normal. RESPIRATORY:  Lungs are clear. There is normal respiratory effort, with equal breath sounds bilaterally, and without pathologic use of accessory muscles. CARDIOVASCULAR: Heart is regular without murmurs, gallops, or rubs. GI: Open midline wound with good evidence of granulation tissue.  No evidence of infection.  Colostomy in place working on patent.  No evidence of ischemia  The abdomen is soft, nontender, and nondistended. There are no palpable masses. There is no hepatosplenomegaly. There are normal bowel sounds in all quadrants. GU: Rectal deferred.   MUSCULOSKELETAL: Normal muscle strength and tone. No cyanosis or edema.   SKIN: Turgor is good and  there are no pathologic skin lesions or ulcers. NEUROLOGIC: Motor and sensation is grossly normal. Cranial nerves are grossly intact. PSYCH:  Oriented to person, place and time. Affect is normal.  Data Reviewed  I have personally reviewed the patient's imaging, laboratory findings and medical records.    Assessment/Plan 67 year old female well-known to our service is post Luz Brazen 6 weeks ago since with severe dehydration and failure to thrive as well as urinary tract infection and symptomatic anemia. I think her condition is not related to any complications related to her recent surgery but rather as a result of her failure to thrive, malnutrition and GI bleed. Definitely recommend work-up for a GI  bleed and anemia. There is no evidence of acute interabdominal abscesses or surgical disease at this time. Commend continuation of wound care with daily wet-to-dry to the abdominal wound.  Nutritional support.  I do not necessarily think that she have a degree of infectious colitis that will merit antibiotic therapy but on the other hand she may need antibiotics for her UTI. Please let us know if there is any other surgical concerns.  We will follow her up as an outpatient. I will also check Urine tox screen  Sterling Bigiego Daisee Centner, MD FACS General Surgeon 04/06/2018, 12:35 PM

## 2018-04-06 NOTE — Transfer of Care (Signed)
Immediate Anesthesia Transfer of Care Note  Patient: Annette Miller  Procedure(s) Performed: ESOPHAGOGASTRODUODENOSCOPY (EGD) (N/A )  Patient Location: PACU  Anesthesia Type:General  Level of Consciousness: sedated  Airway & Oxygen Therapy: Patient Spontanous Breathing and Patient connected to nasal cannula oxygen  Post-op Assessment: Report given to RN and Post -op Vital signs reviewed and stable  Post vital signs: Reviewed and stable  Last Vitals:  Vitals Value Taken Time  BP 94/59 04/06/2018  3:06 PM  Temp 36.2 C 04/06/2018  3:06 PM  Pulse 73 04/06/2018  3:06 PM  Resp 14 04/06/2018  3:06 PM  SpO2 100 % 04/06/2018  3:06 PM  Vitals shown include unvalidated device data.  Last Pain:  Vitals:   04/06/18 1506  TempSrc: Tympanic  PainSc:       Patients Stated Pain Goal: 0 (04/06/18 1042)  Complications: No apparent anesthesia complications

## 2018-04-06 NOTE — Progress Notes (Signed)
Code blue called Patient unresponsive Has blood pressure and pulse Has active GI bleed Patient electively intubated by ER physician and transferred to ICU Stat 3 units prbc IV ordered Hb and Hct stat ordered E link intensivist informed Further management as per ICU attending

## 2018-04-06 NOTE — Procedures (Signed)
Central Venous Catheter Insertion Procedure Note Shelby Mattocksamela S Logan [id number] [birth year]  Procedure: Insertion of Central Venous Catheter Indications: Assessment of intravascular volume, Drug and/or fluid administration and Frequent blood sampling  Procedure Details Consent: Risks of procedure as well as the alternatives and risks of each were explained to the (patient/caregiver).  Consent for procedure obtained. Time Out: Verified patient identification, verified procedure, site/side was marked, verified correct patient position, special equipment/implants available, medications/allergies/relevent history reviewed, required imaging and test results available.  Performed  Maximum sterile technique was used including antiseptics, cap, gloves, gown, hand hygiene, mask and sheet. Skin prep: Chlorhexidine; local anesthetic administered A antimicrobial bonded/coated triple lumen catheter was placed in the left internal jugular vein using the Seldinger technique.  Evaluation Blood flow good Complications: No apparent complications Patient did tolerate procedure well. Chest X-ray ordered to verify placement.  CXR: pending.  Left Internal Jugular CVL placed utilizing ultrasound no complications noted during or following procedure  Sonda Rumbleana Meegan Shanafelt, AGNP  Pulmonary/Critical Care Pager [phone] (please enter 7 digits) PCCM Consult Pager [phone] (please enter 7 digits)

## 2018-04-06 NOTE — Progress Notes (Signed)
[facility] - [place] [facility]                                                                                                                                                                                  Patient Demographics   Annette Miller, is a 67 y.o. female, DOB - 1951-02-23, ZOX:[id number]  Admit date - 04/05/2018   Admitting Physician Cammy CopaAngela Maier, MD  Outpatient Primary MD for the patient is Steele Sizerrissman, Mark A, MD   LOS - 0  Subjective: Patient feeling better status post transfusion Denies any abdominal pain nausea vomiting   Review of Systems:   CONSTITUTIONAL: No documented fever. No fatigue, weakness. No weight gain, no weight loss.  EYES: No blurry or double vision.  ENT: No tinnitus. No postnasal drip. No redness of the oropharynx.  RESPIRATORY: No cough, no wheeze, no hemoptysis. No dyspnea.  CARDIOVASCULAR: No chest pain. No orthopnea. No palpitations. No syncope.  GASTROINTESTINAL: No nausea, no vomiting or diarrhea. No abdominal pain.  Positive melena or hematochezia.  GENITOURINARY: No dysuria or hematuria.  ENDOCRINE: No polyuria or nocturia. No heat or cold intolerance.  HEMATOLOGY: No anemia. No bruising. No bleeding.  INTEGUMENTARY: No rashes. No lesions.  MUSCULOSKELETAL: No arthritis. No swelling. No gout.  NEUROLOGIC: No numbness, tingling, or ataxia. No seizure-type activity.  PSYCHIATRIC: No anxiety. No insomnia. No ADD.    Vitals:   Vitals:   04/06/18 1506 04/06/18 1516 04/06/18 1526 04/06/18 1536  BP: (!) 94/59 91/62 96/64  110/67  Pulse: 77 71 78 74  Resp: 15 13 17 13   Temp: (!) 97.5 F (36.4 C)     TempSrc: Tympanic     SpO2: 100% 100% 100% 99%  Weight:      Height:        Wt Readings from Last 3 Encounters:  04/06/18 44.3 kg (97 lb 11.2 oz)  03/24/18 38.8 kg (85 lb 9.6 oz)  03/01/18 50 kg (110 lb 3.7 oz)     Intake/Output Summary (Last 24 hours) at 04/06/2018 1654 Last data filed at 04/06/2018 1457 Gross per 24  hour  Intake 5685.63 ml  Output 100 ml  Net 5585.63 ml    Physical Exam:   GENERAL: Pleasant-appearing in no apparent distress.  HEAD, EYES, EARS, NOSE AND THROAT: Atraumatic, normocephalic. Extraocular muscles are intact. Pupils equal and reactive to light. Sclerae anicteric. No conjunctival injection. No oro-pharyngeal erythema.  NECK: Supple. There is no jugular venous distention. No bruits, no lymphadenopathy, no thyromegaly.  HEART: Regular rate and rhythm,. No murmurs, no rubs, no clicks.  LUNGS: Clear to auscultation bilaterally. No rales or rhonchi. No wheezes.  ABDOMEN: Soft, flat, nontender, nondistended.  Colostomy bag is in place EXTREMITIES: No evidence of any cyanosis, clubbing, or peripheral edema.  +2 pedal and radial pulses bilaterally.  NEUROLOGIC: The patient is alert, awake, and oriented x3 with no focal motor or sensory deficits appreciated bilaterally.  SKIN: Moist and warm with no rashes appreciated.  Psych: Not anxious, depressed LN: No inguinal LN enlargement    Antibiotics   Anti-infectives (From admission, onward)   Start     Dose/Rate Route Frequency Ordered Stop   04/06/18 0515  metroNIDAZOLE (FLAGYL) IVPB 500 mg     500 mg 100 mL/hr over 60 Minutes Intravenous Every 8 hours 04/06/18 0504     04/06/18 0100  piperacillin-tazobactam (ZOSYN) IVPB 3.375 g     3.375 g 12.5 mL/hr over 240 Minutes Intravenous Every 8 hours 04/06/18 0045     04/05/18 1845  piperacillin-tazobactam (ZOSYN) IVPB 3.375 g     3.375 g 100 mL/hr over 30 Minutes Intravenous  Once 04/05/18 1840 04/05/18 1955      Medications   Scheduled Meds: . docusate sodium  100 mg Oral BID  . feeding supplement (ENSURE ENLIVE)  237 mL Oral BID BM  . [START ON 04/07/2018] folic acid  1 mg Oral Daily  . folic acid  2 mg Oral Once  . levothyroxine  25 mcg Oral QAC breakfast  . loratadine  10 mg Oral Daily  . magnesium chloride  64 mg Oral Daily  . [START ON 04/07/2018] multivitamin-lutein  1  capsule Oral Daily  . [START ON 04/09/2018] pantoprazole  40 mg Intravenous Q12H  . rosuvastatin  10 mg Oral Daily  . vitamin E  1,000 Units Oral BID   Continuous Infusions: . metronidazole 500 mg (04/06/18 1333)  . pantoprozole (PROTONIX) infusion 8 mg/hr (04/05/18 2042)  . piperacillin-tazobactam (ZOSYN)  IV 3.375 g (04/06/18 0339)   PRN Meds:.acetaminophen **OR** acetaminophen, bisacodyl, HYDROcodone-acetaminophen, ondansetron **OR** ondansetron (ZOFRAN) IV, traZODone   Data Review:   Micro Results Recent Results (from the past 240 hour(s))  Blood Culture (routine x 2)     Status: None (Preliminary result)   Collection Time: 04/05/18  7:08 PM  Result Value Ref Range Status   Specimen Description BLOOD BLOOD RIGHT WRIST  Final   Special Requests   Final    BOTTLES DRAWN AEROBIC AND ANAEROBIC Blood Culture adequate volume   Culture   Final    NO GROWTH < 24 HOURS Performed at [facility], [address], [place], [place] [address]    Report Status PENDING  Incomplete  Blood Culture (routine x 2)     Status: None (Preliminary result)   Collection Time: 04/05/18  7:09 PM  Result Value Ref Range Status   Specimen Description BLOOD LEFT ANTECUBITAL  Final   Special Requests   Final    BOTTLES DRAWN AEROBIC AND ANAEROBIC Blood Culture results may not be optimal due to an inadequate volume of blood received in culture bottles   Culture   Final    NO GROWTH < 24 HOURS Performed at [facility], [address], [place], [place] [address]    Report Status PENDING  Incomplete    Radiology Reports Dg Chest 2 View  Result Date: 04/05/2018 CLINICAL DATA:  Chest pain EXAM: CHEST - 2 VIEW COMPARISON:  March 04, 2018 FINDINGS: Nonacute proximal right humeral fracture. The heart, hila, mediastinum, lungs, and pleura are otherwise normal. IMPRESSION: Nonacute proximal right humeral fracture.  No acute abnormality. Electronically Signed   By: Gerome Sam  III M.D   On:  04/05/2018 18:17   Ct Abdomen Pelvis W Contrast  Result Date: 04/05/2018 CLINICAL DATA:  Left upper quadrant abdominal pain with nausea and vomiting. Recent partial colectomy and colostomy on 02/25/2018 for perforated sigmoid diverticulitis. EXAM: CT ABDOMEN AND PELVIS WITH CONTRAST TECHNIQUE: Multidetector CT imaging of the abdomen and pelvis was performed using the standard protocol following bolus administration of intravenous contrast. CONTRAST:  75mL OMNIPAQUE IOHEXOL 300 MG/ML  SOLN COMPARISON:  CT abdomen pelvis dated Feb 25, 2018. FINDINGS: Lower chest: No acute abnormality. Hepatobiliary: Diffuse hepatic steatosis. The gallbladder remains mildly distended. No gallbladder wall thickening or gallstones. Unchanged mild dilatation of the common bile duct, measuring up to 10 mm. Pancreas: Unremarkable. No pancreatic ductal dilatation or surrounding inflammatory changes. Spleen: Normal in size without focal abnormality. Adrenals/Urinary Tract: Adrenal glands are unremarkable. Kidneys are normal, without renal calculi, focal lesion, or hydronephrosis. Bladder is unremarkable. Stomach/Bowel: Postsurgical changes related to sigmoid colectomy with left lower quadrant colostomy. There is moderate circumferential wall thickening of the proximal descending colon extending to the ostomy. The appendix is surgically absent. Improving but persistent wall thickening along the antrum of the stomach and first portion of the duodenum. Fecalization of distal small bowel contents likely represents delayed transit. No bowel obstruction. Vascular/Lymphatic: Aortic atherosclerosis. No enlarged abdominal or pelvic lymph nodes. Reproductive: Status post hysterectomy. No adnexal masses. Other: 2.0 x 2.5 cm rounded area of inflammatory fat stranding in the right mid abdomen. No free fluid or pneumoperitoneum. Mild presacral inflammatory stranding. Open midline abdominal surgical incision. Musculoskeletal: No acute or significant  osseous findings. IMPRESSION: 1. Postsurgical changes related to sigmoid colectomy with left lower quadrant colostomy. Moderate circumferential wall thickening of the proximal descending colon extending to the ostomy, consistent with colitis. 2. Improving but persistent wall thickening along the antrum of the stomach and first portion of the duodenum with adjacent prominent lymph nodes which are slightly increased in size, now measuring up to 8 mm in short axis. Correlation with endoscopy is recommended if not previously performed. 3. Unchanged mild dilatation of the common bile duct and gallbladder distention. Correlate with LFTs and consider further evaluation with ERCP or MRCP as clinically indicated. 4. New 2.0 x 2.5 cm rounded area of inflammatory fat stranding in the right mid abdomen may represent omental infarct. Electronically Signed   By: Obie Dredge M.D.   On: 04/05/2018 21:32     CBC Recent Labs  Lab 04/05/18 1908 04/06/18 0411  WBC 8.2 8.1  HGB 5.9* 7.3*  HCT 17.6* 21.1*  PLT 407 362  MCV 108.6* 102.6*  MCH 36.7* 35.5*  MCHC 33.8 34.6  RDW 24.2* 21.7*    Chemistries  Recent Labs  Lab 04/05/18 1908 04/05/18 1909 04/06/18 0411  NA 136  --  137  K 4.2  --  3.5  CL 98  --  105  CO2 30  --  27  GLUCOSE 124*  --  62*  BUN 20  --  20  CREATININE 1.67*  --  1.31*  CALCIUM 10.2  --  8.3*  AST  --  32  --   ALT  --  24  --   ALKPHOS  --  114  --   BILITOT  --  0.6  --    ------------------------------------------------------------------------------------------------------------------ estimated creatinine clearance is 29.5 mL/min (A) (by C-G formula based on SCr of 1.31 mg/dL (H)). ------------------------------------------------------------------------------------------------------------------ No results for input(s): HGBA1C in the last 72 hours. ------------------------------------------------------------------------------------------------------------------ No  results for  input(s): CHOL, HDL, LDLCALC, TRIG, CHOLHDL, LDLDIRECT in the last 72 hours. ------------------------------------------------------------------------------------------------------------------ No results for input(s): TSH, T4TOTAL, T3FREE, THYROIDAB in the last 72 hours.  Invalid input(s): FREET3 ------------------------------------------------------------------------------------------------------------------ Recent Labs    04/06/18 0411  FOLATE 5.1*  FERRITIN 127  TIBC NOT CALCULATED  IRON 87    Coagulation profile No results for input(s): INR, PROTIME in the last 168 hours.  No results for input(s): DDIMER in the last 72 hours.  Cardiac Enzymes Recent Labs  Lab 04/05/18 1908 04/05/18 2211  TROPONINI 0.03* 0.03*   ------------------------------------------------------------------------------------------------------------------ Invalid input(s): POCBNP    Assessment & Plan   .1  Sepsis, likely secondary to intra-abdominal infection.    Continue Zosyn and Flagyl for now, seen by surgery there is no surgical needs at this point 2.  Acute colitis,  continue Zosyn and Flagyl IV.    Stool for C. difficile negative  3.  Upper GI bleed.    Continue PPIs EGD pending 4.  Acute UTI.    Follow urine culture 5.  Severe anemia, likely multifactorial, related to chronic anemia, worsened by acute blood loss during the recent surgery,  transfuse for hemoglobin less than 7  6.  Acute renal failure, likely prerenal.    Continue IV fluids follow renal function 7.  Tobacco abuse.  I discussed with the patient regarding tobacco cessation strongly recommended she stop smoking 4 minutes spent        Code Status Orders  (From admission, onward)        Start     Ordered   04/06/18 0229  Full code  Continuous     04/06/18 0229    Code Status History    Date Active Date Inactive Code Status Order ID Comments User Context   02/25/2018 1326 03/12/2018 1730 Full Code [id number]   Star Age, DO Inpatient    Advance Directive Documentation     Most Recent Value  Type of Advance Directive  Healthcare Power of Attorney, Living will  Pre-existing out of facility DNR order (yellow form or pink MOST form)  -  "MOST" Form in Place?  -           Consults GI ,SURGERY   DVT Prophylaxis  SCD'S  Lab Results  Component Value Date   PLT 362 04/06/2018     Time Spent in minutes    Greater than 50% of time spent in care coordination and counseling patient regarding the condition and plan of care.   Auburn Bilberry M.D on 04/06/2018 at 4:54 PM  Between 7am to 6pm - Pager - [phone]  After 6pm go to [email or web address] - Social research officer, government  [employer]   Office  [phone]

## 2018-04-06 NOTE — Consult Note (Signed)
CRITICAL CARE NOTE  CC  Acute resp failure  SUBJECTIVE 67 yo WF admitted for lethargy and GIB GI consulted s/p EGD this AM Patient was in room and with acute vomiting blood and acute LOC CODE BLUE CALLED Did NOT Lose pulse. Patient was intubated, placed on full vent support  fio2 at 100%, full vent support    BP 110/67   Pulse 74   Temp (!) 97.5 F (36.4 C) (Tympanic)   Resp 13   Ht _0  (1.575 m)   Wt 97 lb 11.2 oz (44.3 kg)   SpO2 99%   BMI 17.87 kg/m    REVIEW OF SYSTEMS  PATIENT IS UNABLE TO PROVIDE COMPLETE REVIEW OF SYSTEM S DUE TO SEVERE CRITICAL ILLNESS AND ENCEPHALOPATHY   PHYSICAL EXAMINATION:  GENERAL:critically ill appearing, +resp distress HEAD: Normocephalic, atraumatic.  EYES: Pupils equal, round, reactive to light.  No scleral icterus.  MOUTH: Moist mucosal membrane. NECK: Supple. No thyromegaly. No nodules. No JVD.  PULMONARY: +rhonchi, +wheezing CARDIOVASCULAR: S1 and S2. Regular rate and rhythm. No murmurs, rubs, or gallops.  GASTROINTESTINAL: Soft, nontender, -distended. No masses. Positive bowel sounds. No hepatosplenomegaly.  MUSCULOSKELETAL: No swelling, clubbing, or edema.  NEUROLOGIC: obtunded, GCS<8 SKIN:intact,warm,dry  ASSESSMENT AND PLAN 67 yo WF with h/o perforated diverticulitis s/p surgery 6 weeks ago presented with lethargy and GIB with acute and severe resp failure from acute aspiration pneumonitis from active GIB    Severe Hypoxic and Hypercapnic Respiratory Failure -continue Full MV support -continue Bronchodilator Therapy -Wean Fio2 and PEEP as tolerated -will perform SAT/SBT when respiratory parameters are met   Renal Failure-most likely due to ATN -follow chem 7 -follow UO -continue Foley Catheter-assess need   NEUROLOGY - intubated and sedated - minimal sedation to achieve a RASS goal: -1    CARDIAC ICU monitoring  ACTIVE GIB protonix infusion GI to be updated  DVT/GI PRX ordered TRANSFUSIONS AS  NEEDED MONITOR FSBS ASSESS the need for LABS as needed   Critical Care Time devoted to patient care services described in this note is 45 minutes.   Overall, patient is critically ill, prognosis is guarded.  Patient with Multiorgan failure and at high risk for cardiac arrest and death.    Corrin Parker, M.D.  [facility]  Medical Director Lupus Director [facility] Cardio-Pulmonary Department

## 2018-04-07 ENCOUNTER — Other Ambulatory Visit: Payer: Self-pay | Admitting: Family Medicine

## 2018-04-07 ENCOUNTER — Inpatient Hospital Stay: Payer: Self-pay

## 2018-04-07 ENCOUNTER — Encounter
Admission: EM | Disposition: A | Discharge status: Home Health | Payer: Self-pay | Source: Home / Self Care | Attending: Internal Medicine

## 2018-04-07 DIAGNOSIS — K920 Hematemesis: Secondary | ICD-10-CM

## 2018-04-07 DIAGNOSIS — K922 Gastrointestinal hemorrhage, unspecified: Secondary | ICD-10-CM

## 2018-04-07 DIAGNOSIS — D649 Anemia, unspecified: Secondary | ICD-10-CM

## 2018-04-07 DIAGNOSIS — J9601 Acute respiratory failure with hypoxia: Secondary | ICD-10-CM

## 2018-04-07 DIAGNOSIS — R0602 Shortness of breath: Secondary | ICD-10-CM

## 2018-04-07 HISTORY — PX: VISCERAL ANGIOGRAPHY: CATH118276

## 2018-04-07 LAB — BPAM RBC
Blood Product Expiration Date: 201908052359
Blood Product Expiration Date: 201908052359
ISSUE DATE / TIME: 201907092128
ISSUE DATE / TIME: 201907101904
UNIT TYPE AND RH: 5100
Unit Type and Rh: 5100

## 2018-04-07 LAB — BLOOD GAS, VENOUS
ACID-BASE EXCESS: 7.6 mmol/L — AB (ref 0.0–2.0)
Bicarbonate: 33.7 mmol/L — ABNORMAL HIGH (ref 20.0–28.0)
PATIENT TEMPERATURE: 37
pCO2, Ven: 52 mmHg (ref 44.0–60.0)
pH, Ven: 7.42 (ref 7.250–7.430)

## 2018-04-07 LAB — BLOOD GAS, ARTERIAL
ACID-BASE DEFICIT: 2.3 mmol/L — AB (ref 0.0–2.0)
BICARBONATE: 20.1 mmol/L (ref 20.0–28.0)
FIO2: 0.4
O2 Saturation: 99.5 %
PATIENT TEMPERATURE: 37
PH ART: 7.48 — AB (ref 7.350–7.450)
pCO2 arterial: 27 mmHg — ABNORMAL LOW (ref 32.0–48.0)
pO2, Arterial: 158 mmHg — ABNORMAL HIGH (ref 83.0–108.0)

## 2018-04-07 LAB — TYPE AND SCREEN
ABO/RH(D): O POS
ANTIBODY SCREEN: NEGATIVE
Unit division: 0
Unit division: 0

## 2018-04-07 LAB — GLUCOSE, CAPILLARY
GLUCOSE-CAPILLARY: 50 mg/dL — AB (ref 70–99)
GLUCOSE-CAPILLARY: 100 mg/dL — AB (ref 70–99)
Glucose-Capillary: 38 mg/dL — CL (ref 70–99)
Glucose-Capillary: 108 mg/dL — ABNORMAL HIGH (ref 70–99)
Glucose-Capillary: 70 mg/dL (ref 70–99)
Glucose-Capillary: 76 mg/dL (ref 70–99)

## 2018-04-07 LAB — BASIC METABOLIC PANEL
Anion gap: 4 — ABNORMAL LOW (ref 5–15)
BUN: 18 mg/dL (ref 8–23)
CHLORIDE: 114 mmol/L — AB (ref 98–111)
CO2: 23 mmol/L (ref 22–32)
Calcium: 6.5 mg/dL — ABNORMAL LOW (ref 8.9–10.3)
Creatinine, Ser: 1 mg/dL (ref 0.44–1.00)
GFR calc Af Amer: >60 mL/min (ref >60)
GFR, EST NON AFRICAN AMERICAN: 57 mL/min — AB (ref >60)
Glucose, Bld: 65 mg/dL — ABNORMAL LOW (ref 70–99)
POTASSIUM: 2.9 mmol/L — AB (ref 3.5–5.1)
SODIUM: 141 mmol/L (ref 135–145)

## 2018-04-07 LAB — PHOSPHORUS: PHOSPHORUS: 2.4 mg/dL — AB (ref 2.5–4.6)

## 2018-04-07 LAB — CERULOPLASMIN: Ceruloplasmin: 14.1 mg/dL — ABNORMAL LOW (ref 19.0–39.0)

## 2018-04-07 LAB — MAGNESIUM: Magnesium: 1.3 mg/dL — ABNORMAL LOW (ref 1.7–2.4)

## 2018-04-07 LAB — HEMOGLOBIN AND HEMATOCRIT, BLOOD
HCT: 34.6 % — ABNORMAL LOW (ref 35.0–47.0)
HCT: 35.6 % (ref 35.0–47.0)
HEMATOCRIT: 36.2 % (ref 35.0–47.0)
HEMOGLOBIN: 12.1 g/dL (ref 12.0–16.0)
Hemoglobin: 12.5 g/dL (ref 12.0–16.0)
Hemoglobin: 12.5 g/dL (ref 12.0–16.0)

## 2018-04-07 SURGERY — VISCERAL ANGIOGRAPHY
Anesthesia: Moderate Sedation

## 2018-04-07 MED ORDER — FENTANYL CITRATE (PF) 100 MCG/2ML IJ SOLN: 25.0000 ug | INTRAMUSCULAR | Status: DC | PRN

## 2018-04-07 MED ORDER — MAGNESIUM SULFATE 4 GM/100ML IV SOLN
4.0000 g | Freq: Once | INTRAVENOUS | Status: AC
  Administered 2018-04-07: 4 g via INTRAVENOUS
  Filled 2018-04-07: qty 100

## 2018-04-07 MED ORDER — POTASSIUM & SODIUM PHOSPHATES 280-160-250 MG PO PACK
2.0000 | PACK | Freq: Once | ORAL | Status: DC
  Filled 2018-04-07: qty 2

## 2018-04-07 MED ORDER — TRACE MINERALS CR-CU-MN-SE-ZN 10-1000-500-60 MCG/ML IV SOLN
INTRAVENOUS | Status: AC
  Administered 2018-04-07: 18:00:00 via INTRAVENOUS
  Filled 2018-04-07: qty 720

## 2018-04-07 MED ORDER — MIDAZOLAM HCL 5 MG/5ML IJ SOLN
INTRAMUSCULAR | Status: AC
  Filled 2018-04-07: qty 5

## 2018-04-07 MED ORDER — POTASSIUM PHOSPHATES 15 MMOLE/5ML IV SOLN
20.0000 meq | Freq: Once | INTRAVENOUS | Status: AC
  Administered 2018-04-07: 20 meq via INTRAVENOUS
  Filled 2018-04-07: qty 4.55

## 2018-04-07 MED ORDER — ORAL CARE MOUTH RINSE
15.0000 mL | OROMUCOSAL | Status: DC
  Administered 2018-04-07 – 2018-04-08 (×11): 15 mL via OROMUCOSAL

## 2018-04-07 MED ORDER — SODIUM CHLORIDE 0.9% FLUSH
10.0000 mL | Freq: Two times a day (BID) | INTRAVENOUS | Status: DC
  Administered 2018-04-08 – 2018-04-09 (×2): 40 mL
  Administered 2018-04-10: 10 mL

## 2018-04-07 MED ORDER — PROPOFOL 1000 MG/100ML IV EMUL
0.0000 ug/kg/min | INTRAVENOUS | Status: DC
  Administered 2018-04-07: 15 ug/kg/min via INTRAVENOUS
  Filled 2018-04-07: qty 100

## 2018-04-07 MED ORDER — SODIUM CHLORIDE 0.9 % IV SOLN
INTRAVENOUS | Status: DC
  Administered 2018-04-07: 13:00:00 via INTRAVENOUS

## 2018-04-07 MED ORDER — FENTANYL CITRATE (PF) 100 MCG/2ML IJ SOLN
INTRAMUSCULAR | Status: DC | PRN
  Administered 2018-04-07 (×2): 25 ug via INTRAVENOUS

## 2018-04-07 MED ORDER — POTASSIUM CHLORIDE CRYS ER 20 MEQ PO TBCR: 40.0000 meq | EXTENDED_RELEASE_TABLET | ORAL | Status: DC

## 2018-04-07 MED ORDER — HEPARIN (PORCINE) IN NACL 1000-0.9 UT/500ML-% IV SOLN
INTRAVENOUS | Status: AC
  Filled 2018-04-07: qty 1000

## 2018-04-07 MED ORDER — IOPAMIDOL (ISOVUE-300) INJECTION 61%
INTRAVENOUS | Status: DC | PRN
  Administered 2018-04-07: 40 mL via INTRAVENOUS

## 2018-04-07 MED ORDER — CHLORHEXIDINE GLUCONATE 0.12% ORAL RINSE (MEDLINE KIT)
15.0000 mL | Freq: Two times a day (BID) | OROMUCOSAL | Status: DC
  Administered 2018-04-07 – 2018-04-09 (×4): 15 mL via OROMUCOSAL

## 2018-04-07 MED ORDER — POTASSIUM CHLORIDE 2 MEQ/ML IV SOLN
INTRAVENOUS | Status: DC
  Administered 2018-04-07 – 2018-04-08 (×2): via INTRAVENOUS
  Filled 2018-04-07 (×4): qty 1000

## 2018-04-07 MED ORDER — DEXTROSE 50 % IV SOLN
INTRAVENOUS | Status: AC
  Administered 2018-04-07: 50 mL via INTRAVENOUS
  Filled 2018-04-07: qty 50

## 2018-04-07 MED ORDER — LIDOCAINE-EPINEPHRINE (PF) 1 %-1:200000 IJ SOLN
INTRAMUSCULAR | Status: AC
  Filled 2018-04-07: qty 30

## 2018-04-07 MED ORDER — SODIUM CHLORIDE 0.9% FLUSH: 10.0000 mL | INTRAVENOUS | Status: DC | PRN

## 2018-04-07 MED ORDER — DEXTROSE 50 % IV SOLN
1.0000 | Freq: Once | INTRAVENOUS | Status: AC
  Administered 2018-04-07: 50 mL via INTRAVENOUS

## 2018-04-07 MED ORDER — MIDAZOLAM HCL 2 MG/2ML IJ SOLN
INTRAMUSCULAR | Status: DC | PRN
  Administered 2018-04-07: 2 mg via INTRAVENOUS

## 2018-04-07 SURGICAL SUPPLY — 16 items
BLOCK BEAD 300-500 MIC (Vascular Products) ×2 IMPLANT
CATH MICROCATH PRGRT 2.8F 110 (CATHETERS) IMPLANT
CATH PIG 70CM (CATHETERS) ×1 IMPLANT
CATH VS15FR (CATHETERS) ×1 IMPLANT
COIL 400 COMPLEX SOFT 4X6CM (Vascular Products) ×1 IMPLANT
COIL 400 COMPLEX STD 4X10CM (Vascular Products) ×1 IMPLANT
DEVICE STARCLOSE SE CLOSURE (Vascular Products) ×1 IMPLANT
DEVICE TORQUE .025-.038 (MISCELLANEOUS) ×2 IMPLANT
GUIDEWIRE ANGLED .035 180CM (WIRE) ×2 IMPLANT
HANDLE DETACHMENT COIL (MISCELLANEOUS) ×2 IMPLANT
MICROCATH PROGREAT 2.8F 110 CM (CATHETERS) ×2
PACK ANGIOGRAPHY (CUSTOM PROCEDURE TRAY) ×2 IMPLANT
SHEATH BRITE TIP 5FRX11 (SHEATH) ×2 IMPLANT
SYR MEDRAD MARK V 150ML (SYRINGE) ×2 IMPLANT
TUBING CONTRAST HIGH PRESS 72 (TUBING) ×2 IMPLANT
WIRE J 3MM .035X145CM (WIRE) ×2 IMPLANT

## 2018-04-07 NOTE — Progress Notes (Signed)
[place] [facility]                                                                                                                                                                                  Patient Demographics   Annette Miller, is a 67 y.o. female, DOB - September 07, 1951, EHU:[id number]  Admit date - 04/05/2018   Admitting Physician Amelia Jo, MD  Outpatient Primary MD for the patient is Guadalupe Maple, MD   LOS - 1  Subjective: Patient currently on the ventilator events of last night noted    Review of Systems:   CONSTITUTIONAL: On the ventilator Vitals:   Vitals:   04/07/18 1405 04/07/18 1410 04/07/18 1415 04/07/18 1420  BP:      Pulse:      Resp:      Temp:      TempSrc:      SpO2: 100% 100% 100% 100%  Weight:      Height:        Wt Readings from Last 3 Encounters:  04/06/18 44.8 kg (98 lb 12.3 oz)  03/24/18 38.8 kg (85 lb 9.6 oz)  03/01/18 50 kg (110 lb 3.7 oz)     Intake/Output Summary (Last 24 hours) at 04/07/2018 1538 Last data filed at 04/07/2018 1455 Gross per 24 hour  Intake 4944.19 ml  Output 920 ml  Net 4024.19 ml    Physical Exam:   GENERAL: Critically ill sedated HEAD, EYES, EARS, NOSE AND THROAT: Atraumatic, normocephalic. Pupils equal and reactive to light. Sclerae anicteric. No conjunctival injection. No oro-pharyngeal erythema.  NECK: Supple. There is no jugular venous distention. No bruits, no lymphadenopathy, no thyromegaly.  HEART: Regular rate and rhythm,. No murmurs, no rubs, no clicks.  LUNGS: Clear to auscultation bilaterally. No rales or rhonchi. No wheezes.  ABDOMEN: Soft, flat, nontender, nondistended.  Colostomy bag is in place EXTREMITIES: No evidence of any cyanosis, clubbing, or peripheral edema.  +2 pedal and radial pulses bilaterally.  NEUROLOGIC: Sedated SKIN: Moist and warm with no rashes appreciated.  Psych: Sedated LN: No inguinal LN enlargement    Antibiotics   Anti-infectives  (From admission, onward)   Start     Dose/Rate Route Frequency Ordered Stop   04/06/18 0515  metroNIDAZOLE (FLAGYL) IVPB 500 mg  Status:  Discontinued     500 mg 100 mL/hr over 60 Minutes Intravenous Every 8 hours 04/06/18 0504 04/07/18 1022   04/06/18 0100  piperacillin-tazobactam (ZOSYN) IVPB 3.375 g     3.375 g 12.5 mL/hr over 240 Minutes Intravenous Every 8 hours 04/06/18 0045     04/05/18 1845  piperacillin-tazobactam (ZOSYN) IVPB 3.375 g  3.375 g 100 mL/hr over 30 Minutes Intravenous  Once 04/05/18 1840 04/05/18 1955      Medications   Scheduled Meds: . sodium chloride   Intravenous Once  . sodium chloride   Intravenous Once  . chlorhexidine gluconate (MEDLINE KIT)  15 mL Mouth Rinse BID  . feeding supplement (ENSURE ENLIVE)  237 mL Oral BID BM  . mouth rinse  15 mL Mouth Rinse 10 times per day  . [START ON 04/09/2018] pantoprazole  40 mg Intravenous Q12H   Continuous Infusions: . Marland KitchenTPN (CLINIMIX-E) Adult    . fentaNYL infusion INTRAVENOUS 200 mcg/hr (04/07/18 1453)  . lactated ringers with kcl 125 mL/hr at 04/07/18 1120  . norepinephrine (LEVOPHED) Adult infusion Stopped (04/07/18 1454)  . pantoprozole (PROTONIX) infusion 8 mg/hr (04/06/18 2039)  . piperacillin-tazobactam (ZOSYN)  IV Stopped (04/07/18 1259)  . potassium PHOSPHATE IVPB (mEq) 20 mEq (04/07/18 1215)   PRN Meds:.acetaminophen **OR** acetaminophen, bisacodyl, fentaNYL, HYDROcodone-acetaminophen, midazolam, midazolam, ondansetron **OR** ondansetron (ZOFRAN) IV, traZODone   Data Review:   Micro Results Recent Results (from the past 240 hour(s))  Blood Culture (routine x 2)     Status: None (Preliminary result)   Collection Time: 04/05/18  7:08 PM  Result Value Ref Range Status   Specimen Description BLOOD BLOOD RIGHT WRIST  Final   Special Requests   Final    BOTTLES DRAWN AEROBIC AND ANAEROBIC Blood Culture adequate volume   Culture   Final    NO GROWTH 2 DAYS Performed at [facility], [address], [place], [place] [address]    Report Status PENDING  Incomplete  Blood Culture (routine x 2)     Status: None (Preliminary result)   Collection Time: 04/05/18  7:09 PM  Result Value Ref Range Status   Specimen Description BLOOD LEFT ANTECUBITAL  Final   Special Requests   Final    BOTTLES DRAWN AEROBIC AND ANAEROBIC Blood Culture results may not be optimal due to an inadequate volume of blood received in culture bottles   Culture   Final    NO GROWTH 2 DAYS Performed at [facility], [address] [place], [place] [address]    Report Status PENDING  Incomplete  MRSA PCR Screening     Status: None   Collection Time: 04/06/18  8:09 PM  Result Value Ref Range Status   MRSA by PCR NEGATIVE NEGATIVE Final    Comment:        The GeneXpert MRSA Assay (FDA approved for NASAL specimens only), is one component of a comprehensive MRSA colonization surveillance program. It is not intended to diagnose MRSA infection nor to guide or monitor treatment for MRSA infections. Performed at [facility], [address], [place], [place] [address]     Radiology Reports Dg Chest 2 View  Result Date: 04/05/2018 CLINICAL DATA:  Chest pain EXAM: CHEST - 2 VIEW COMPARISON:  March 04, 2018 FINDINGS: Nonacute proximal right humeral fracture. The heart, hila, mediastinum, lungs, and pleura are otherwise normal. IMPRESSION: Nonacute proximal right humeral fracture.  No acute abnormality. Electronically Signed   By: Dorise Bullion III M.D   On: 04/05/2018 18:17   Dg Abd 1 View  Result Date: 04/06/2018 CLINICAL DATA:  Orogastric tube placement. EXAM: ABDOMEN - 1 VIEW COMPARISON:  Radiograph of March 06, 2018. FINDINGS: The bowel gas pattern is normal. No radio-opaque calculi or other significant radiographic abnormality are seen. Distal tip of nasogastric tube is seen in proximal stomach. IMPRESSION: No evidence  of bowel obstruction or ileus. Distal tip of nasogastric tube is seen  in proximal stomach. Electronically Signed   By: Marijo Conception, M.D.   On: 04/06/2018 20:32   Ct Abdomen Pelvis W Contrast  Result Date: 04/05/2018 CLINICAL DATA:  Left upper quadrant abdominal pain with nausea and vomiting. Recent partial colectomy and colostomy on 02/25/2018 for perforated sigmoid diverticulitis. EXAM: CT ABDOMEN AND PELVIS WITH CONTRAST TECHNIQUE: Multidetector CT imaging of the abdomen and pelvis was performed using the standard protocol following bolus administration of intravenous contrast. CONTRAST:  39m OMNIPAQUE IOHEXOL 300 MG/ML  SOLN COMPARISON:  CT abdomen pelvis dated Feb 25, 2018. FINDINGS: Lower chest: No acute abnormality. Hepatobiliary: Diffuse hepatic steatosis. The gallbladder remains mildly distended. No gallbladder wall thickening or gallstones. Unchanged mild dilatation of the common bile duct, measuring up to 10 mm. Pancreas: Unremarkable. No pancreatic ductal dilatation or surrounding inflammatory changes. Spleen: Normal in size without focal abnormality. Adrenals/Urinary Tract: Adrenal glands are unremarkable. Kidneys are normal, without renal calculi, focal lesion, or hydronephrosis. Bladder is unremarkable. Stomach/Bowel: Postsurgical changes related to sigmoid colectomy with left lower quadrant colostomy. There is moderate circumferential wall thickening of the proximal descending colon extending to the ostomy. The appendix is surgically absent. Improving but persistent wall thickening along the antrum of the stomach and first portion of the duodenum. Fecalization of distal small bowel contents likely represents delayed transit. No bowel obstruction. Vascular/Lymphatic: Aortic atherosclerosis. No enlarged abdominal or pelvic lymph nodes. Reproductive: Status post hysterectomy. No adnexal masses. Other: 2.0 x 2.5 cm rounded area of inflammatory fat stranding in the right mid abdomen. No free fluid or pneumoperitoneum. Mild presacral inflammatory stranding. Open  midline abdominal surgical incision. Musculoskeletal: No acute or significant osseous findings. IMPRESSION: 1. Postsurgical changes related to sigmoid colectomy with left lower quadrant colostomy. Moderate circumferential wall thickening of the proximal descending colon extending to the ostomy, consistent with colitis. 2. Improving but persistent wall thickening along the antrum of the stomach and first portion of the duodenum with adjacent prominent lymph nodes which are slightly increased in size, now measuring up to 8 mm in short axis. Correlation with endoscopy is recommended if not previously performed. 3. Unchanged mild dilatation of the common bile duct and gallbladder distention. Correlate with LFTs and consider further evaluation with ERCP or MRCP as clinically indicated. 4. New 2.0 x 2.5 cm rounded area of inflammatory fat stranding in the right mid abdomen may represent omental infarct. Electronically Signed   By: WTitus DubinM.D.   On: 04/05/2018 21:32   Dg Chest Port 1 View  Result Date: 04/07/2018 CLINICAL DATA:  Encounter for central line placement EXAM: PORTABLE CHEST 1 VIEW COMPARISON:  Earlier today FINDINGS: New left IJ line with tip at the SVC. No pneumothorax or mediastinal widening. Endotracheal tube tip between the clavicular heads and carina. An orogastric tube tip is at the gastric fundus. The lungs remain clear. Normal heart size IMPRESSION: 1. No adverse finding after left IJ line placement. 2. No evidence of active cardiopulmonary disease. Electronically Signed   By: JMonte FantasiaM.D.   On: 04/07/2018 00:14   Dg Chest Port 1 View  Result Date: 04/06/2018 CLINICAL DATA:  Endotracheal tube placement. EXAM: PORTABLE CHEST 1 VIEW COMPARISON:  Radiographs of April 05, 2018. FINDINGS: The heart size and mediastinal contours are within normal limits. Endotracheal tube is seen projected over tracheal air shadow with distal tip 2 cm above the carina. Nasogastric tube tip is seen in  proximal stomach.  There is no evidence of bowel obstruction or ileus. Both lungs are clear. Stable old proximal right humeral fracture is noted. IMPRESSION: Endotracheal and nasogastric tubes are in grossly good position. No acute pulmonary abnormality is noted. Electronically Signed   By: Marijo Conception, M.D.   On: 04/06/2018 20:31   [place] Ekg Site Rite  Result Date: 04/07/2018 If Site Rite image not attached, placement could not be confirmed due to current cardiac rhythm.    CBC Recent Labs  Lab 04/05/18 1908 04/06/18 0411 04/06/18 1926 04/07/18 0644  WBC 8.2 8.1 6.9  --   HGB 5.9* 7.3* 5.8* 12.1  HCT 17.6* 21.1* 16.8* 34.6*  PLT 407 362 335  --   MCV 108.6* 102.6* 103.9*  --   MCH 36.7* 35.5* 36.1*  --   MCHC 33.8 34.6 34.7  --   RDW 24.2* 21.7* 22.1*  --     Chemistries  Recent Labs  Lab 04/05/18 1908 04/05/18 1909 04/06/18 0411 04/06/18 1926 04/07/18 0644  NA 136  --  137 140 141  K 4.2  --  3.5 3.6 2.9*  CL 98  --  105 111 114*  CO2 30  --  '27 23 23  '$ GLUCOSE 124*  --  62* 116* 65*  BUN 20  --  '20 22 18  '$ CREATININE 1.67*  --  1.31* 1.31* 1.00  CALCIUM 10.2  --  8.3* 7.1* 6.5*  MG  --   --   --   --  1.3*  AST  --  32  --  24  --   ALT  --  24  --  15  --   ALKPHOS  --  114  --  74  --   BILITOT  --  0.6  --  0.5  --    ------------------------------------------------------------------------------------------------------------------ estimated creatinine clearance is 39.1 mL/min (by C-G formula based on SCr of 1 mg/dL). ------------------------------------------------------------------------------------------------------------------ No results for input(s): HGBA1C in the last 72 hours. ------------------------------------------------------------------------------------------------------------------ No results for input(s): CHOL, HDL, LDLCALC, TRIG, CHOLHDL, LDLDIRECT in the last 72  hours. ------------------------------------------------------------------------------------------------------------------ No results for input(s): TSH, T4TOTAL, T3FREE, THYROIDAB in the last 72 hours.  Invalid input(s): FREET3 ------------------------------------------------------------------------------------------------------------------ Recent Labs    04/06/18 0411 04/06/18 1128  VITAMINB12  --  574  FOLATE 5.1*  --   FERRITIN 127  --   TIBC NOT CALCULATED  --   IRON 87  --     Coagulation profile Recent Labs  Lab 04/06/18 1925  INR 1.30    No results for input(s): DDIMER in the last 72 hours.  Cardiac Enzymes Recent Labs  Lab 04/05/18 1908 04/05/18 2211  TROPONINI 0.03* 0.03*   ------------------------------------------------------------------------------------------------------------------ Invalid input(s): POCBNP    Assessment & Plan   1. upper GI bleed with a large duodenal ulcer Status post vascular embolization follow hemoglobin closely   Continue Protonix drip  2.  Acute respiratory failure continue ventilator support as per intensivist  3.  Possible.  Acute colitis with sepsis continue IV Zosyn  4.  Acute UTI.    Continue IV antibody 5.  Severe anemia, likely multifactorial, related to chronic anemia, worsened by acute blood loss transfuse as needed 6.  Acute renal failure, likely prerenal.    Continue IV fluids follow renal function 7.  Tobacco abuse.  Nicotine patch when awake         Code Status Orders  (From admission, onward)        Start     Ordered  04/06/18 0229  Full code  Continuous     04/06/18 0229    Code Status History    Date Active Date Inactive Code Status Order ID Comments User Context   02/25/2018 1326 03/12/2018 1730 Full Code [id number]  Hadley Pen, DO Inpatient    Advance Directive Documentation     Most Recent Value  Type of Advance Directive  Healthcare Power of [place], Living will  Pre-existing out of  facility DNR order (yellow form or pink MOST form)  -  "MOST" Form in Place?  -           Consults GI ,SURGERY   DVT Prophylaxis  SCD'S  Lab Results  Component Value Date   PLT 335 04/06/2018     Time Spent in minutes   35MIN  Greater than 50% of time spent in care coordination and counseling patient regarding the condition and plan of care.   Dustin Flock M.D on 04/07/2018 at 3:38 PM  Between 7am to 6pm - Pager - [phone]  After 6pm go to [email or web address] - Proofreader  [employer]   Office  [phone]

## 2018-04-07 NOTE — Progress Notes (Signed)
CRITICAL CARE NOTE  CC  follow up respiratory failure  SUBJECTIVE Patient remains critically ill Prognosis is guarded On vent Plan for SAT/SBT today Follow up GI recs     SIGNIFICANT EVENTS    BP 107/68   Pulse 65   Temp (!) 97.3 F (36.3 C)   Resp 10   Ht 5\' 2"  (1.575 m)   Wt 98 lb 12.3 oz (44.8 kg)   SpO2 100%   BMI 18.06 kg/m    REVIEW OF SYSTEMS  PATIENT IS UNABLE TO PROVIDE COMPLETE REVIEW OF SYSTEM S DUE TO SEVERE CRITICAL ILLNESS AND ENCEPHALOPATHY   PHYSICAL EXAMINATION:  GENERAL:critically ill appearing, +resp distress HEAD: Normocephalic, atraumatic.  EYES: Pupils equal, round, reactive to light.  No scleral icterus.  MOUTH: Moist mucosal membrane. NECK: Supple. No thyromegaly. No nodules. No JVD.  PULMONARY: +rhonchi, +wheezing CARDIOVASCULAR: S1 and S2. Regular rate and rhythm. No murmurs, rubs, or gallops.  GASTROINTESTINAL: Soft, nontender, -distended. No masses. Positive bowel sounds. No hepatosplenomegaly.  MUSCULOSKELETAL: No swelling, clubbing, or edema.  NEUROLOGIC: obtunded, GCS<8 SKIN:intact,warm,dry  ASSESSMENT AND PLAN Initially Admitted for active GIB failure to thrive last 6 weeks Admitted to ICU for acute and severe resp failure from acute aspiration of gastric contents and blood S/p CODE blue now intubated and sedated on vent  Severe Hypoxic and Hypercapnic Respiratory Failure -plan for SAT/SBT today -BD therapy    NEUROLOGY - intubated and sedated - minimal sedation to achieve a RASS goal: -1   CARDIAC ICU monitoring  ID -continue IV abx as prescibed -follow up cultures  GIB Follow up GI recs protonix infusion Follow H/h and transfuse as needed Follow up gen surgery recs  Mechanical PRX TRANSFUSIONS AS NEEDED MONITOR FSBS ASSESS the need for LABS as needed   Critical Care Time devoted to patient care services described in this note is 32 minutes.   Overall, patient is critically ill, prognosis is  guarded.   Lucie LeatherKurian David Jazmon Kos, M.D.  [facility]  Medical Director [facility] Medical Director [facility]

## 2018-04-07 NOTE — Progress Notes (Signed)
ABG drawn on vent, prvc mode Vt 450  RR 16  FIO2 .40  PEEP 5cm

## 2018-04-07 NOTE — Progress Notes (Signed)
Chaplain followed up with patient, who was resting. Chaplain provided energetic prayer.

## 2018-04-07 NOTE — Anesthesia Postprocedure Evaluation (Signed)
Anesthesia Post Note  Patient: Annette Miller  Procedure(s) Performed: ESOPHAGOGASTRODUODENOSCOPY (EGD) (N/A )  Patient location during evaluation: Endoscopy Anesthesia Type: General Level of consciousness: awake and alert Pain management: pain level controlled Vital Signs Assessment: post-procedure vital signs reviewed and stable Respiratory status: spontaneous breathing, nonlabored ventilation and respiratory function stable Cardiovascular status: blood pressure returned to baseline and stable Postop Assessment: no apparent nausea or vomiting Anesthetic complications: no     Last Vitals:  Vitals:   04/07/18 0600 04/07/18 0630  BP: 117/72 106/70  Pulse: 64 65  Resp: 10 10  Temp: (!) 36.2 C (!) 36.2 C  SpO2: 100% 100%    Last Pain:  Vitals:   04/07/18 0600  TempSrc: Esophageal  PainSc:                  Christia ReadingScott T Jinna Weinman

## 2018-04-07 NOTE — Progress Notes (Signed)
CC: GI bleed  Subjective: Had GI bleed after scope , intubated and in the ICU. appropriate response to transfusion  Objective: Vital signs in last 24 hours: Temp:  [95.9 F (35.5 C)-97.8 F (36.6 C)] 96.3 F (35.7 C) (07/11 1300) Pulse Rate:  [57-91] 72 (07/11 1545) Resp:  [10-18] 10 (07/11 1545) BP: (72-148)/(55-84) 85/57 (07/11 1545) SpO2:  [100 %] 100 % (07/11 1545) FiO2 (%):  [0.3 %-60 %] 0.3 % (07/11 1410) Weight:  [44.8 kg (98 lb 12.3 oz)] 44.8 kg (98 lb 12.3 oz) (07/10 2030) Last BM Date: (ostomy)  Intake/Output from previous day: 07/10 0701 - 07/11 0700 In: 4373.6 [I.V.:2820.6; Blood:1553] Out: 670 [Urine:270; Stool:400] Intake/Output this shift: Total I/O In: 2167.7 [I.V.:1337.2; IV Piggyback:830.5] Out: 250 [Urine:250]  Physical exam:  NAd, sedated , follows simple commands ABd: soft, NT, open wound healing well, colostomy working, no peritonitis Ext: warm and well perfused  Lab Results: CBC  Recent Labs    04/06/18 0411 04/06/18 1926 04/07/18 0644 04/07/18 1533  WBC 8.1 6.9  --   --   HGB 7.3* 5.8* 12.1 12.5  HCT 21.1* 16.8* 34.6* 35.6  PLT 362 335  --   --    BMET Recent Labs    04/06/18 1926 04/07/18 0644  NA 140 141  K 3.6 2.9*  CL 111 114*  CO2 23 23  GLUCOSE 116* 65*  BUN 22 18  CREATININE 1.31* 1.00  CALCIUM 7.1* 6.5*   PT/INR Recent Labs    04/06/18 1925  LABPROT 16.1*  INR 1.30   ABG Recent Labs    04/06/18 1925 04/07/18 0449  PHART 7.47* 7.48*  HCO3 21.1 20.1    Studies/Results: Dg Chest 2 View  Result Date: 04/05/2018 CLINICAL DATA:  Chest pain EXAM: CHEST - 2 VIEW COMPARISON:  March 04, 2018 FINDINGS: Nonacute proximal right humeral fracture. The heart, hila, mediastinum, lungs, and pleura are otherwise normal. IMPRESSION: Nonacute proximal right humeral fracture.  No acute abnormality. Electronically Signed   By: Gerome Sam III M.D   On: 04/05/2018 18:17   Dg Abd 1 View  Result Date: 04/06/2018 CLINICAL DATA:   Orogastric tube placement. EXAM: ABDOMEN - 1 VIEW COMPARISON:  Radiograph of March 06, 2018. FINDINGS: The bowel gas pattern is normal. No radio-opaque calculi or other significant radiographic abnormality are seen. Distal tip of nasogastric tube is seen in proximal stomach. IMPRESSION: No evidence of bowel obstruction or ileus. Distal tip of nasogastric tube is seen in proximal stomach. Electronically Signed   By: Lupita Raider, M.D.   On: 04/06/2018 20:32   Ct Abdomen Pelvis W Contrast  Result Date: 04/05/2018 CLINICAL DATA:  Left upper quadrant abdominal pain with nausea and vomiting. Recent partial colectomy and colostomy on 02/25/2018 for perforated sigmoid diverticulitis. EXAM: CT ABDOMEN AND PELVIS WITH CONTRAST TECHNIQUE: Multidetector CT imaging of the abdomen and pelvis was performed using the standard protocol following bolus administration of intravenous contrast. CONTRAST:  75mL OMNIPAQUE IOHEXOL 300 MG/ML  SOLN COMPARISON:  CT abdomen pelvis dated Feb 25, 2018. FINDINGS: Lower chest: No acute abnormality. Hepatobiliary: Diffuse hepatic steatosis. The gallbladder remains mildly distended. No gallbladder wall thickening or gallstones. Unchanged mild dilatation of the common bile duct, measuring up to 10 mm. Pancreas: Unremarkable. No pancreatic ductal dilatation or surrounding inflammatory changes. Spleen: Normal in size without focal abnormality. Adrenals/Urinary Tract: Adrenal glands are unremarkable. Kidneys are normal, without renal calculi, focal lesion, or hydronephrosis. Bladder is unremarkable. Stomach/Bowel: Postsurgical changes related to sigmoid colectomy  with left lower quadrant colostomy. There is moderate circumferential wall thickening of the proximal descending colon extending to the ostomy. The appendix is surgically absent. Improving but persistent wall thickening along the antrum of the stomach and first portion of the duodenum. Fecalization of distal small bowel contents likely  represents delayed transit. No bowel obstruction. Vascular/Lymphatic: Aortic atherosclerosis. No enlarged abdominal or pelvic lymph nodes. Reproductive: Status post hysterectomy. No adnexal masses. Other: 2.0 x 2.5 cm rounded area of inflammatory fat stranding in the right mid abdomen. No free fluid or pneumoperitoneum. Mild presacral inflammatory stranding. Open midline abdominal surgical incision. Musculoskeletal: No acute or significant osseous findings. IMPRESSION: 1. Postsurgical changes related to sigmoid colectomy with left lower quadrant colostomy. Moderate circumferential wall thickening of the proximal descending colon extending to the ostomy, consistent with colitis. 2. Improving but persistent wall thickening along the antrum of the stomach and first portion of the duodenum with adjacent prominent lymph nodes which are slightly increased in size, now measuring up to 8 mm in short axis. Correlation with endoscopy is recommended if not previously performed. 3. Unchanged mild dilatation of the common bile duct and gallbladder distention. Correlate with LFTs and consider further evaluation with ERCP or MRCP as clinically indicated. 4. New 2.0 x 2.5 cm rounded area of inflammatory fat stranding in the right mid abdomen may represent omental infarct. Electronically Signed   By: Obie DredgeWilliam T Derry M.D.   On: 04/05/2018 21:32   Dg Chest Port 1 View  Result Date: 04/07/2018 CLINICAL DATA:  Encounter for central line placement EXAM: PORTABLE CHEST 1 VIEW COMPARISON:  Earlier today FINDINGS: New left IJ line with tip at the SVC. No pneumothorax or mediastinal widening. Endotracheal tube tip between the clavicular heads and carina. An orogastric tube tip is at the gastric fundus. The lungs remain clear. Normal heart size IMPRESSION: 1. No adverse finding after left IJ line placement. 2. No evidence of active cardiopulmonary disease. Electronically Signed   By: Marnee SpringJonathon  Watts M.D.   On: 04/07/2018 00:14   Dg  Chest Port 1 View  Result Date: 04/06/2018 CLINICAL DATA:  Endotracheal tube placement. EXAM: PORTABLE CHEST 1 VIEW COMPARISON:  Radiographs of April 05, 2018. FINDINGS: The heart size and mediastinal contours are within normal limits. Endotracheal tube is seen projected over tracheal air shadow with distal tip 2 cm above the carina. Nasogastric tube tip is seen in proximal stomach. There is no evidence of bowel obstruction or ileus. Both lungs are clear. Stable old proximal right humeral fracture is noted. IMPRESSION: Endotracheal and nasogastric tubes are in grossly good position. No acute pulmonary abnormality is noted. Electronically Signed   By: Lupita RaiderJames  Green Jr, M.D.   On: 04/06/2018 20:31   [place] Ekg Site Rite  Result Date: 04/07/2018 If Site Rite image not attached, placement could not be confirmed due to current cardiac rhythm.   Anti-infectives: Anti-infectives (From admission, onward)   Start     Dose/Rate Route Frequency Ordered Stop   04/06/18 0515  metroNIDAZOLE (FLAGYL) IVPB 500 mg  Status:  Discontinued     500 mg 100 mL/hr over 60 Minutes Intravenous Every 8 hours 04/06/18 0504 04/07/18 1022   04/06/18 0100  piperacillin-tazobactam (ZOSYN) IVPB 3.375 g     3.375 g 12.5 mL/hr over 240 Minutes Intravenous Every 8 hours 04/06/18 0045     04/05/18 1845  piperacillin-tazobactam (ZOSYN) IVPB 3.375 g     3.375 g 100 mL/hr over 30 Minutes Intravenous  Once 04/05/18 1840 04/05/18 1955  Assessment/Plan:  Upper GI bleed s/p embolization by Vascular surgery No evidence of active bleeding No surgical indication, Given her current co morbidities I do no think she will survive another major surgery and it might be technically very challenging given her recent surgery for perforated diverticulitis I do not consider that she is almost at prohibitive risks for surgical re intervention  Sterling Big, MD, [facility]  04/07/2018

## 2018-04-07 NOTE — Op Note (Signed)
[facility] Percutaneous Study/Intervention Procedural Note     Surgeon(s): [employer]  Assistants: none  Pre-operative Diagnosis: 1.  Large duodenal ulcer creating upper GI bleed 2.  Respiratory distress requiring intubation 3.  Recent surgery for perforated diverticulitis including colostomy  Post-operative diagnosis: Same  Procedure(s) Performed: 1. Ultrasound guidance for vascular access right femoral artery 2. Catheter placement into gastroduodenal branch of the celiac artery from right femoral approach 3. Aortogram and selective angiogram of the celiac artery and gastroduodenal artery 4. Microbead embolization of the gastroduodenal artery with 0.5 cc of 300-500  polyvinyl alcohol beads and coil embolization of the gastroduodenal artery with to 4 mm Ruby coils. 5. StarClose closure device right femoral artery  Anesthesia: Moderate conscious sedation for approximately 20 minutes using 2 mg of Versed and an additional 50 Mcg of Fentanyl as well as her existing fentanyl drip  EBL: 5 cc  Fluoro Time: 4.7 minutes  Contrast: 40 at cc  Indications: Patient is a 67 y.o.female with brisk upper GI bleeding with resultant anemia. The patient has an endoscopy showing a large duodenal ulcer with stigmata of recent bleeding.  This was yesterday and then she had a large hemorrhagic event last night which was clearly an upper GI bleed with the duodenal ulcer being the only expected source. The patient is brought in for angiography for further evaluation and potential treatment. Risks and benefits are discussed and informed consent is obtained  Procedure: The patient was identified and appropriate procedural time out was performed. The patient was then placed supine on the table and prepped and draped in the usual sterile fashion.Moderate conscious sedation was administered during  a face to face encounter with the patient throughout the procedure with my supervision of the RN administering medicines and monitoring the patient's vital signs, pulse oximetry, telemetry and mental status throughout from the start of the procedure until the patient was taken to the recovery room.  Ultrasound was used to evaluate the right common femoral artery. It was patent . A digital ultrasound image was acquired. A Seldinger needle was used to access the right common femoral artery under direct ultrasound guidance and a permanent image was performed. A 0.035 J wire was advanced without resistance and a 5Fr sheath was placed. Pigtail catheter was placed into the aorta and an AP aortogram was performed. This demonstrated what appeared to be normal origins of the renal arteries as well as the celiac and superior mesenteric artery.  We transitioned to the lateral projection to cannulate the celiac artery. A VS 1 catheter was used to selectively cannulate the celiac artery.  This demonstrated a typical trifurcation with the left gastric artery, splenic artery, and common hepatic artery.  As the common hepatic artery branch in the proper hepatic artery a small gastroduodenal artery was seen coursing inferiorly.  Although there was not obvious active bleeding, with her large duodenal ulcer and the recent bleeding embolization is indicated. I initially advanced the Pro-Great microcatheter out the common hepatic artery and then down into the gastroduodenal artery and instilled approximately 0.5 cc of 300 to 500 m polyvinyl alcohol beads in this location. Angiogram following this showed the main vessels to be open with less brisk filling. I then reperformed coil embolization of the gastroduodenal artery.  A 4 mm diameter by 10 cm length standard Ruby coil followed by 4 mm diameter by 6 cm length soft Ruby coil completely pack the gastroduodenal artery over several centimeters.  Angiogram showed successful  occlusion  of the gastroduodenal artery without any limitation in flow in the hepatic artery.  This was seen and selective imaging in the prograde catheter as well as to the VS 1 catheter in the main celiac trunk.  I elected to terminate the procedure. The diagnostic catheter was removed. StarClose closure device was deployed in usual fashion with excellent hemostatic result. The patient was taken to the recovery room in stable condition having tolerated the procedure well.     Findings:None  Disposition: Patient was taken to the recovery room in stable condition having tolerated the procedure well.  Complications: None  Festus BarrenJason Dew 04/07/2018 2:34 PM   This note was created with Dragon Medical transcription system. Any errors in dictation are purely unintentional.

## 2018-04-07 NOTE — Progress Notes (Signed)
Chaplain followed-up with the patient, who was sleeping. Chaplain also chatted with her son Annette Miller. He is in a good place emotionally and understands the significant challenges that his mother will face. Annette Miller rationalized this concern with the pragmatic phrase, "We all have to go sometime."

## 2018-04-07 NOTE — Consult Note (Signed)
[facility] Vascular Consult Note  MRN : [id number]  TALULA ISLAND is a 67 y.o. (Oct 21, 1950) female who presents with chief complaint of  Chief Complaint  Patient presents with  . Chest Pain  . Nausea  . Emesis  . Weakness  .  History of Present Illness: I am asked to see the patient by Dr. Marius Ditch for evaluation for embolization for a GI bleed.  The patient remains intubated after a CODE BLUE event last night and cannot provide any history today.  The history is obtained from the previous medical record.  She had an endoscopy yesterday which showed a large duodenal ulcer.  At the time, it was not actively bleeding but there was a lot of blood that was likely coming from the ulcer as there were no other sources seen.  Last night, she had a dramatic upper GI bleed with bright red blood emesis and a major drop in her hemoglobin.  She actually had a CODE BLUE event and was intubated and sedated and taken to the ICU where she has done better overnight.  The bleeding appears to have slowed and the NG output is much darker.  Her hemoglobin is better this morning.  With the source being the duodenal ulceration, consideration for embolization of the gastroduodenal artery was suggested by gastroenterology.  Current Facility-Administered Medications  Medication Dose Route Frequency Provider Last Rate Last Dose  . Marland KitchenTPN (CLINIMIX-E) Adult   Intravenous Continuous TPN Charlett Nose, RPH      . 0.9 %  sodium chloride infusion (Manually program via Guardrails IV Fluids)   Intravenous Once Pyreddy, Pavan, MD      . 0.9 %  sodium chloride infusion (Manually program via Guardrails IV Fluids)   Intravenous Once Awilda Bill, NP      . 0.9 %  sodium chloride infusion   Intravenous Continuous Keilan Nichol, Erskine Squibb, MD      . acetaminophen (TYLENOL) tablet 650 mg  650 mg Oral Q6H PRN Amelia Jo, MD       Or  . acetaminophen (TYLENOL) suppository 650 mg  650 mg Rectal Q6H PRN Amelia Jo, MD      . bisacodyl (DULCOLAX) EC tablet 5 mg  5 mg Oral Daily PRN Amelia Jo, MD      . chlorhexidine gluconate (MEDLINE KIT) (PERIDEX) 0.12 % solution 15 mL  15 mL Mouth Rinse BID Awilda Bill, NP   15 mL at 04/07/18 0848  . feeding supplement (ENSURE ENLIVE) (ENSURE ENLIVE) liquid 237 mL  237 mL Oral BID BM Vanga, Tally Due, MD      . fentaNYL (SUBLIMAZE) bolus via infusion 25 mcg  25 mcg Intravenous Q1H PRN Awilda Bill, NP   25 mcg at 04/06/18 2109  . fentaNYL 2558mg in NS 2560m(1061mml) infusion-PREMIX  25-400 mcg/hr Intravenous Continuous BlaAwilda BillP 10 mL/hr at 04/07/18 1000 100 mcg/hr at 04/07/18 1000  . HYDROcodone-acetaminophen (NORCO/VICODIN) 5-325 MG per tablet 1-2 tablet  1-2 tablet Oral Q4H PRN MaiAmelia JoD   1 tablet at 04/06/18 1042  . lactated ringers 1,000 mL with potassium chloride 30 mEq infusion   Intravenous Continuous KasFlora LippsD 125 mL/hr at 04/07/18 1120    . MEDLINE mouth rinse  15 mL Mouth Rinse 10 times per day BlaAwilda BillP   15 mL at 04/07/18 1137  . midazolam (VERSED) injection 1 mg  1 mg Intravenous Q15 min PRN BlaAwilda BillP      .  midazolam (VERSED) injection 1 mg  1 mg Intravenous Q2H PRN Awilda Bill, NP      . norepinephrine (LEVOPHED) 77m in D5W 2541mpremix infusion  0-40 mcg/min Intravenous Titrated BlAwilda BillNP 15 mL/hr at 04/07/18 1235 4 mcg/min at 04/07/18 1235  . ondansetron (ZOFRAN) tablet 4 mg  4 mg Oral Q6H PRN MaAmelia JoMD       Or  . ond[facility]etron ([facility] mg  4 mg Intravenous Q6H PRN MaAmelia JoMD      . pantoprazole (PROTONIX) 80 mg in sodium chloride 0.9 % 250 mL (0.32 mg/mL) infusion  8 mg/hr Intravenous Continuous PaDustin FlockMD 25 mL/hr at 04/06/18 2039 8 mg/hr at 04/06/18 2039  . [START ON 04/09/2018] pantoprazole (PROTONIX) injection 40 mg  40 mg Intravenous Q12H NoEula ListenMD      . piperacillin-tazobactam (ZOSYN) IVPB 3.375 g  3.375 g  Intravenous Q8H MaAmelia JoMD 12.5 mL/hr at 04/07/18 1000    . potassium PHOSPHATE 20 mEq in dextrose 5 % 250 mL infusion  20 mEq Intravenous Once SiCharlett NoseRPH 42 mL/hr at 04/07/18 1215 20 mEq at 04/07/18 1215  . traZODone (DESYREL) tablet 25 mg  25 mg Oral QHS PRN MaAmelia JoMD        Past Medical History:  Diagnosis Date  . Allergy   . Anemia   . Arthritis   . Depression   . Goiter   . Hyperlipidemia   . Hypothyroidism   . Osteoporosis     Past Surgical History:  Procedure Laterality Date  . ABDOMINAL HYSTERECTOMY    . COLONOSCOPY WITH PROPOFOL N/A 01/14/2018   Procedure: COLONOSCOPY WITH PROPOFOL;  Surgeon: VaLin LandsmanMD;  [facility];  Service: Gastroenterology;  Laterality: N/A;  . ESOPHAGOGASTRODUODENOSCOPY (EGD) WITH PROPOFOL N/A 01/14/2018   Procedure: ESOPHAGOGASTRODUODENOSCOPY (EGD) WITH PROPOFOL;  Surgeon: VaLin LandsmanMD;  [facility];  Service: Gastroenterology;  Laterality: N/A;  . LAPAROTOMY N/A 02/25/2018   Procedure: EXPLORATORY LAPAROTOMY, PARTMAN'S PROCEDURE, APPENDECTOMY, COLOSTOMY;  Surgeon: KaHadley PenDO;  Location: [facility];  Service: General;  Laterality: N/A;  . TONSILLECTOMY      Social History Social History   Tobacco Use  . Smoking status: Current Every Day Smoker    Packs/day: 0.10    Types: Cigarettes  . Smokeless tobacco: Never Used  Substance Use Topics  . Alcohol use: No  . Drug use: No    Family History Family History  Problem Relation Age of Onset  . Cancer Mother   . AAA (abdominal aortic aneurysm) Father   . Cancer Maternal Grandmother   . Stroke Maternal Grandmother   . Cancer Paternal Grandfather     Allergies  Allergen Reactions  . Codeine Diarrhea and Nausea And Vomiting  . Prednisone Other (See Comments)    Reaction: violence     REVIEW OF SYSTEMS (Negative unless checked) Unable to obtain as patient is on the vent and unable to provide  history  Physical Examination  Vitals:   04/07/18 0900 04/07/18 1000 04/07/18 1100 04/07/18 1200  BP: 127/75 (!) 81/59 (!) 73/56 99/66  Pulse: 91 80 77 63  Resp: _0 Temp: (!) 97.2 F (36.2 C) (!) 96.4 F (35.8 C) (!) 96.8 F (36 C) (!) 96.3 F (35.7 C)  TempSrc:      SpO2: 100% 100% 100% 100%  Weight:      Height:  Body mass index is 18.06 kg/m. Gen: Frail, almost cachectic white female in the ICU on the ventilator Head: [place]/AT, + temporalis wasting.  Ear/Nose/Throat:  nares w/o erythema or drainage, oropharynx w/o Erythema/Exudate Eyes: Sclera non-icteric, conjunctiva clear Neck: Trachea midline.  No JVD.  Pulmonary:  Good air movement, coarse breath sounds on the vent Cardiac: RRR, no JVD Vascular:  Vessel Right Left  Radial Palpable Palpable                          PT  1+ palpable  1+ palpable  DP  1+ palpable  1+ palpable   Gastrointestinal: soft, non-tender/non-distended.  Previous midline laparotomy incision healing.  Ostomy present Musculoskeletal: M/S 5/5 throughout.  Sluggish capillary refill.  No deformity or atrophy. No edema. Neurologic: Difficult to assess secondary to being on a ventilator and sedated Psychiatric: Able to assess on the ventilator and sedated Dermatologic: No rashes or ulcers noted.  No cellulitis or open wounds.       CBC Lab Results  Component Value Date   WBC 6.9 04/06/2018   HGB 12.1 04/07/2018   HCT 34.6 (L) 04/07/2018   MCV 103.9 (H) 04/06/2018   PLT 335 04/06/2018    BMET    Component Value Date/Time   NA 141 04/07/2018 0644   NA 139 05/20/2017 1024   K 2.9 (L) 04/07/2018 0644   CL 114 (H) 04/07/2018 0644   CO2 23 04/07/2018 0644   GLUCOSE 65 (L) 04/07/2018 0644   BUN 18 04/07/2018 0644   BUN 14 05/20/2017 1024   CREATININE 1.00 04/07/2018 0644   CALCIUM 6.5 (L) 04/07/2018 0644   GFRNONAA 57 (L) 04/07/2018 0644   GFRAA >60 04/07/2018 0644   Estimated Creatinine Clearance: 39.1 mL/min (by  C-G formula based on SCr of 1 mg/dL).  COAG Lab Results  Component Value Date   INR 1.30 04/06/2018    Radiology Dg Chest 2 View  Result Date: 04/05/2018 CLINICAL DATA:  Chest pain EXAM: CHEST - 2 VIEW COMPARISON:  March 04, 2018 FINDINGS: Nonacute proximal right humeral fracture. The heart, hila, mediastinum, lungs, and pleura are otherwise normal. IMPRESSION: Nonacute proximal right humeral fracture.  No acute abnormality. Electronically Signed   By: Dorise Bullion III M.D   On: 04/05/2018 18:17   Dg Abd 1 View  Result Date: 04/06/2018 CLINICAL DATA:  Orogastric tube placement. EXAM: ABDOMEN - 1 VIEW COMPARISON:  Radiograph of March 06, 2018. FINDINGS: The bowel gas pattern is normal. No radio-opaque calculi or other significant radiographic abnormality are seen. Distal tip of nasogastric tube is seen in proximal stomach. IMPRESSION: No evidence of bowel obstruction or ileus. Distal tip of nasogastric tube is seen in proximal stomach. Electronically Signed   By: Marijo Conception, M.D.   On: 04/06/2018 20:32   Ct Abdomen Pelvis W Contrast  Result Date: 04/05/2018 CLINICAL DATA:  Left upper quadrant abdominal pain with nausea and vomiting. Recent partial colectomy and colostomy on 02/25/2018 for perforated sigmoid diverticulitis. EXAM: CT ABDOMEN AND PELVIS WITH CONTRAST TECHNIQUE: Multidetector CT imaging of the abdomen and pelvis was performed using the standard protocol following bolus administration of intravenous contrast. CONTRAST:  25m OMNIPAQUE IOHEXOL 300 MG/ML  SOLN COMPARISON:  CT abdomen pelvis dated Feb 25, 2018. FINDINGS: Lower chest: No acute abnormality. Hepatobiliary: Diffuse hepatic steatosis. The gallbladder remains mildly distended. No gallbladder wall thickening or gallstones. Unchanged mild dilatation of the common bile duct, measuring up to 10 mm. Pancreas:  Unremarkable. No pancreatic ductal dilatation or surrounding inflammatory changes. Spleen: Normal in size without focal  abnormality. Adrenals/Urinary Tract: Adrenal glands are unremarkable. Kidneys are normal, without renal calculi, focal lesion, or hydronephrosis. Bladder is unremarkable. Stomach/Bowel: Postsurgical changes related to sigmoid colectomy with left lower quadrant colostomy. There is moderate circumferential wall thickening of the proximal descending colon extending to the ostomy. The appendix is surgically absent. Improving but persistent wall thickening along the antrum of the stomach and first portion of the duodenum. Fecalization of distal small bowel contents likely represents delayed transit. No bowel obstruction. Vascular/Lymphatic: Aortic atherosclerosis. No enlarged abdominal or pelvic lymph nodes. Reproductive: Status post hysterectomy. No adnexal masses. Other: 2.0 x 2.5 cm rounded area of inflammatory fat stranding in the right mid abdomen. No free fluid or pneumoperitoneum. Mild presacral inflammatory stranding. Open midline abdominal surgical incision. Musculoskeletal: No acute or significant osseous findings. IMPRESSION: 1. Postsurgical changes related to sigmoid colectomy with left lower quadrant colostomy. Moderate circumferential wall thickening of the proximal descending colon extending to the ostomy, consistent with colitis. 2. Improving but persistent wall thickening along the antrum of the stomach and first portion of the duodenum with adjacent prominent lymph nodes which are slightly increased in size, now measuring up to 8 mm in short axis. Correlation with endoscopy is recommended if not previously performed. 3. Unchanged mild dilatation of the common bile duct and gallbladder distention. Correlate with LFTs and consider further evaluation with ERCP or MRCP as clinically indicated. 4. New 2.0 x 2.5 cm rounded area of inflammatory fat stranding in the right mid abdomen may represent omental infarct. Electronically Signed   By: Titus Dubin M.D.   On: 04/05/2018 21:32   Dg Chest Port 1  View  Result Date: 04/07/2018 CLINICAL DATA:  Encounter for central line placement EXAM: PORTABLE CHEST 1 VIEW COMPARISON:  Earlier today FINDINGS: New left IJ line with tip at the SVC. No pneumothorax or mediastinal widening. Endotracheal tube tip between the clavicular heads and carina. An orogastric tube tip is at the gastric fundus. The lungs remain clear. Normal heart size IMPRESSION: 1. No adverse finding after left IJ line placement. 2. No evidence of active cardiopulmonary disease. Electronically Signed   By: Monte Fantasia M.D.   On: 04/07/2018 00:14   Dg Chest Port 1 View  Result Date: 04/06/2018 CLINICAL DATA:  Endotracheal tube placement. EXAM: PORTABLE CHEST 1 VIEW COMPARISON:  Radiographs of April 05, 2018. FINDINGS: The heart size and mediastinal contours are within normal limits. Endotracheal tube is seen projected over tracheal air shadow with distal tip 2 cm above the carina. Nasogastric tube tip is seen in proximal stomach. There is no evidence of bowel obstruction or ileus. Both lungs are clear. Stable old proximal right humeral fracture is noted. IMPRESSION: Endotracheal and nasogastric tubes are in grossly good position. No acute pulmonary abnormality is noted. Electronically Signed   By: Marijo Conception, M.D.   On: 04/06/2018 20:31   [place] Ekg Site Rite  Result Date: 04/07/2018 If Site Rite image not attached, placement could not be confirmed due to current cardiac rhythm.     Assessment/Plan 1.  Upper GI bleed.  Duodenal ulcer likely the source.  As such, embolization of the gastroduodenal artery is certainly an option.  Have discussed with the son who is in agreement with proceeding and feel this would limit major rebleeding episode significantly.  We will perform today. 2.  Acute blood loss anemia.  Secondary to #1.  Transfusions have improved.  3.  Status post perforated diverticulitis and colectomy several weeks ago.  Still recovering from this.   Leotis Pain,  MD  04/07/2018 12:57 PM    This note was created with Dragon medical transcription system.  Any error is purely unintentional

## 2018-04-07 NOTE — H&P (Signed)
[facility] History & Physical Update  The patient was interviewed and re-examined.  The patient's previous History and Physical has been reviewed and is unchanged.  There is no change in the plan of care. We plan to proceed with the scheduled procedure.  Festus BarrenJason Zakiah Beckerman, MD  04/07/2018, 1:46 PM

## 2018-04-07 NOTE — Progress Notes (Signed)
Cephas Darby, MD [address]  [place]  [place], [place] [address]  Main: [phone]  Fax: [phone] Pager: [phone]   Subjective: Overnight events noted. The patient was unresponsive after large-volume hematemesis last night, CODE BLUE was initiated. She was transferred to ICU, aggressively resuscitated with blood transfusions and intubated for airway protection. Her hemoglobin responded appropriately to PRBCs. She has NG tube to suction in place which is putting out coffee-ground material. Patient is off sedation and able to follow simple commands.   Objective: Vital signs in last 24 hours: Vitals:   04/07/18 1000 04/07/18 1100 04/07/18 1200 04/07/18 1300  BP: (!) 81/59 (!) 73/56 99/66 108/66  Pulse: 80 77 63 63  Resp: _0 Temp: (!) 96.4 F (35.8 C) (!) 96.8 F (36 C) (!) 96.3 F (35.7 C) (!) 96.3 F (35.7 C)  TempSrc:      SpO2: 100% 100% 100% 100%  Weight:      Height:       Weight change: 12 lb 11.2 oz (5.761 kg)  Intake/Output Summary (Last 24 hours) at 04/07/2018 1312 Last data filed at 04/07/2018 1100 Gross per 24 hour  Intake 5644.19 ml  Output 670 ml  Net 4974.19 ml     Exam: Heart:: Regular rate and rhythm or S1S2 present Lungs: clear to auscultation Abdomen: soft, nontender, normal bowel sounds Colostomy bag in left mid quadrant  Lab Results: CBC Latest Ref Rng & Units 04/07/2018 04/06/2018 04/06/2018  WBC 3.6 - 11.0 K/uL - 6.9 8.1  Hemoglobin 12.0 - 16.0 g/dL 12.1 5.8(L) 7.3(L)  Hematocrit 35.0 - 47.0 % 34.6(L) 16.8(L) 21.1(L)  Platelets 150 - 440 K/uL - 335 362   Micro Results: Recent Results (from the past 240 hour(s))  Blood Culture (routine x 2)     Status: None (Preliminary result)   Collection Time: 04/05/18  7:08 PM  Result Value Ref Range Status   Specimen Description BLOOD BLOOD RIGHT WRIST  Final   Special Requests   Final    BOTTLES DRAWN AEROBIC AND ANAEROBIC Blood Culture adequate volume   Culture    Final    NO GROWTH 2 DAYS Performed at [facility], [address] [place], [place] [address]    Report Status PENDING  Incomplete  Blood Culture (routine x 2)     Status: None (Preliminary result)   Collection Time: 04/05/18  7:09 PM  Result Value Ref Range Status   Specimen Description BLOOD LEFT ANTECUBITAL  Final   Special Requests   Final    BOTTLES DRAWN AEROBIC AND ANAEROBIC Blood Culture results may not be optimal due to an inadequate volume of blood received in culture bottles   Culture   Final    NO GROWTH 2 DAYS Performed at [facility], [address] [place], [place] [address]    Report Status PENDING  Incomplete  MRSA PCR Screening     Status: None   Collection Time: 04/06/18  8:09 PM  Result Value Ref Range Status   MRSA by PCR NEGATIVE NEGATIVE Final    Comment:        The GeneXpert MRSA Assay (FDA approved for NASAL specimens only), is one component of a comprehensive MRSA colonization surveillance program. It is not intended to diagnose MRSA infection nor to guide or monitor treatment for MRSA infections. Performed at [facility], [address], [place], [place] [address]    Studies/Results: Dg Chest 2 View  Result  Date: 04/05/2018 CLINICAL DATA:  Chest pain EXAM: CHEST - 2 VIEW COMPARISON:  March 04, 2018 FINDINGS: Nonacute proximal right humeral fracture. The heart, hila, mediastinum, lungs, and pleura are otherwise normal. IMPRESSION: Nonacute proximal right humeral fracture.  No acute abnormality. Electronically Signed   By: Dorise Bullion III M.D   On: 04/05/2018 18:17   Dg Abd 1 View  Result Date: 04/06/2018 CLINICAL DATA:  Orogastric tube placement. EXAM: ABDOMEN - 1 VIEW COMPARISON:  Radiograph of March 06, 2018. FINDINGS: The bowel gas pattern is normal. No radio-opaque calculi or other significant radiographic abnormality are seen. Distal tip of nasogastric tube is seen in proximal stomach. IMPRESSION: No evidence of  bowel obstruction or ileus. Distal tip of nasogastric tube is seen in proximal stomach. Electronically Signed   By: Marijo Conception, M.D.   On: 04/06/2018 20:32   Ct Abdomen Pelvis W Contrast  Result Date: 04/05/2018 CLINICAL DATA:  Left upper quadrant abdominal pain with nausea and vomiting. Recent partial colectomy and colostomy on 02/25/2018 for perforated sigmoid diverticulitis. EXAM: CT ABDOMEN AND PELVIS WITH CONTRAST TECHNIQUE: Multidetector CT imaging of the abdomen and pelvis was performed using the standard protocol following bolus administration of intravenous contrast. CONTRAST:  53m OMNIPAQUE IOHEXOL 300 MG/ML  SOLN COMPARISON:  CT abdomen pelvis dated Feb 25, 2018. FINDINGS: Lower chest: No acute abnormality. Hepatobiliary: Diffuse hepatic steatosis. The gallbladder remains mildly distended. No gallbladder wall thickening or gallstones. Unchanged mild dilatation of the common bile duct, measuring up to 10 mm. Pancreas: Unremarkable. No pancreatic ductal dilatation or surrounding inflammatory changes. Spleen: Normal in size without focal abnormality. Adrenals/Urinary Tract: Adrenal glands are unremarkable. Kidneys are normal, without renal calculi, focal lesion, or hydronephrosis. Bladder is unremarkable. Stomach/Bowel: Postsurgical changes related to sigmoid colectomy with left lower quadrant colostomy. There is moderate circumferential wall thickening of the proximal descending colon extending to the ostomy. The appendix is surgically absent. Improving but persistent wall thickening along the antrum of the stomach and first portion of the duodenum. Fecalization of distal small bowel contents likely represents delayed transit. No bowel obstruction. Vascular/Lymphatic: Aortic atherosclerosis. No enlarged abdominal or pelvic lymph nodes. Reproductive: Status post hysterectomy. No adnexal masses. Other: 2.0 x 2.5 cm rounded area of inflammatory fat stranding in the right mid abdomen. No free fluid or  pneumoperitoneum. Mild presacral inflammatory stranding. Open midline abdominal surgical incision. Musculoskeletal: No acute or significant osseous findings. IMPRESSION: 1. Postsurgical changes related to sigmoid colectomy with left lower quadrant colostomy. Moderate circumferential wall thickening of the proximal descending colon extending to the ostomy, consistent with colitis. 2. Improving but persistent wall thickening along the antrum of the stomach and first portion of the duodenum with adjacent prominent lymph nodes which are slightly increased in size, now measuring up to 8 mm in short axis. Correlation with endoscopy is recommended if not previously performed. 3. Unchanged mild dilatation of the common bile duct and gallbladder distention. Correlate with LFTs and consider further evaluation with ERCP or MRCP as clinically indicated. 4. New 2.0 x 2.5 cm rounded area of inflammatory fat stranding in the right mid abdomen may represent omental infarct. Electronically Signed   By: WTitus DubinM.D.   On: 04/05/2018 21:32   Dg Chest Port 1 View  Result Date: 04/07/2018 CLINICAL DATA:  Encounter for central line placement EXAM: PORTABLE CHEST 1 VIEW COMPARISON:  Earlier today FINDINGS: New left IJ line with tip at the SVC. No pneumothorax or mediastinal widening. Endotracheal tube tip between the clavicular  heads and carina. An orogastric tube tip is at the gastric fundus. The lungs remain clear. Normal heart size IMPRESSION: 1. No adverse finding after left IJ line placement. 2. No evidence of active cardiopulmonary disease. Electronically Signed   By: Monte Fantasia M.D.   On: 04/07/2018 00:14   Dg Chest Port 1 View  Result Date: 04/06/2018 CLINICAL DATA:  Endotracheal tube placement. EXAM: PORTABLE CHEST 1 VIEW COMPARISON:  Radiographs of April 05, 2018. FINDINGS: The heart size and mediastinal contours are within normal limits. Endotracheal tube is seen projected over tracheal air shadow with distal  tip 2 cm above the carina. Nasogastric tube tip is seen in proximal stomach. There is no evidence of bowel obstruction or ileus. Both lungs are clear. Stable old proximal right humeral fracture is noted. IMPRESSION: Endotracheal and nasogastric tubes are in grossly good position. No acute pulmonary abnormality is noted. Electronically Signed   By: Marijo Conception, M.D.   On: 04/06/2018 20:31   [place] Ekg Site Rite  Result Date: 04/07/2018 If Site Rite image not attached, placement could not be confirmed due to current cardiac rhythm.  Medications: I have reviewed the patient's current medications. Scheduled Meds: . sodium chloride   Intravenous Once  . sodium chloride   Intravenous Once  . chlorhexidine gluconate (MEDLINE KIT)  15 mL Mouth Rinse BID  . feeding supplement (ENSURE ENLIVE)  237 mL Oral BID BM  . mouth rinse  15 mL Mouth Rinse 10 times per day  . [START ON 04/09/2018] pantoprazole  40 mg Intravenous Q12H   Continuous Infusions: . Marland KitchenTPN (CLINIMIX-E) Adult    . sodium chloride 20 mL/hr at 04/07/18 1303  . fentaNYL infusion INTRAVENOUS 100 mcg/hr (04/07/18 1000)  . lactated ringers with kcl 125 mL/hr at 04/07/18 1120  . norepinephrine (LEVOPHED) Adult infusion 4 mcg/min (04/07/18 1235)  . pantoprozole (PROTONIX) infusion 8 mg/hr (04/06/18 2039)  . piperacillin-tazobactam (ZOSYN)  IV Stopped (04/07/18 1259)  . potassium PHOSPHATE IVPB (mEq) 20 mEq (04/07/18 1215)   PRN Meds:.acetaminophen **OR** acetaminophen, bisacodyl, fentaNYL, HYDROcodone-acetaminophen, midazolam, midazolam, ondansetron **OR** ondansetron (ZOFRAN) IV, traZODone   Assessment: Active Problems:   Symptomatic anemia  Upper GI bleed from duodenal ulcer. Status post EGD on 04/06/18 which revealed large, cratered duodenal ulcer with flat pigmented spot. The decision was made not to treat the ulcer yesterday due to low risk for rebleeding.The ulcer was previously treated about 3 months ago. Patient had another episode  of large-volume hematemesis from duodenal ulcer with hemodynamic instability  Plan: - Recommend urgent vascular consult for embolization of gastroduodenal artery as any kind of endoscopic intervention may not be successful to control bleeding and rather might increase the risk for perforation - recommend to keep the patient intubated - Monitor H&H every 6 hours and transfuse as needed - Continue pantoprazole drip - Strict nothing by mouth     LOS: 1 day   Rohini Vanga 04/07/2018, 1:12 PM

## 2018-04-07 NOTE — Progress Notes (Signed)
Spoke with Huntley DecSara RN re PICC line to be done once pt BP stabilized, possibly to be done 04/08/18.  Pt has adequate access currently. PICC for d/c home needs only.

## 2018-04-07 NOTE — Progress Notes (Signed)
Nutrition Follow-up  DOCUMENTATION CODES:   Severe malnutrition in context of acute illness/injury  INTERVENTION:  Initiate Clinimix E 5/15 at 30 mL/hr. After 24 hours if electrolytes, CBGs, and triglycerides are within acceptable range can advance to goal regimen of Clinimix E 5/15 at 60 mL/hr. Provides 1022 kcal, 72 grams of protein, 1440 mL fluid daily.  Withhold lipids for 7 days per ASPEN/SCCM guidelines.  Provide adult MVI, trace elements, folic acid 1 mg, and thiamine 100 mg as daily TPN additives.  Monitor magnesium, potassium, and phosphorus daily for at least 3 days, MD to replete as needed, as pt is at risk for refeeding syndrome given severe malnutrition.  NUTRITION DIAGNOSIS:   Severe Malnutrition related to acute illness as evidenced by percent weight loss, moderate fat depletion, severe muscle depletion.  Ongoing - addressing with initiation of TPN.  GOAL:   Provide needs based on ASPEN/SCCM guidelines  Met with initiation of TPN.  MONITOR:   Diet advancement, Labs, Weight trends, Skin, I & O's  REASON FOR ASSESSMENT:   Malnutrition Screening Tool, Ventilator    ASSESSMENT:    67 y.o. female with a history of perforated sigmoid diverticulitis status post status post partial colectomy with colostomy 02/25/2018 presenting for lightheadedness with standing, nausea and vomiting, chest pain, and left upper quadrant pain. Pt found to have colitis and sepsis    -Patient underwent EGD 7/10. Findings included one non-bleeding cratered duodenal ulcer with flat pigmented spot (15 mm in largest dimension), clogged blood and coffee ground material in gastric fundus that was cleared with lavage. -Rapid response was called on evening of 7/10. Patient was vomiting bright red blood and had dark red blood in colostomy. Patient was intubated and transferred to ICU.  Patient intubated and sedated. Currently in [facility] mode with FiO2 28%. Per discussion in rounds plan is for patient  to remain intubated for possible embolization.  Enteral Access: 16 Fr. OGT placed 7/10; distal tip terminates in proximal stomach per abdominal x-ray 7/10; 49 cm external length of tube; currently to LIS  MAP: 78-98 mmHg; patient was on Levophed gtt overnight but is currently off; patient received blood transfusion overnight  IV Access: left IJ CVC triple lumen placed 7/11 with tip terminating at [facility] per chest x-ray 7/11; also pending c/s for PICC placement  Patient is currently intubated on ventilator support Ve: 4.4 L/min Temp (24hrs), Avg:96.8 F (36 C), Min:95.9 F (35.5 C), Max:98.8 F (37.1 C)  Propofol: N/A  Medications reviewed and include: Protonix, fentanyl gtt, LR @ 125 mL/hr, magnesium sulfate 4 grams IV once today, Levophed gtt was on overnight but currently off, Zosyn.  Labs reviewed: CBG 38-108, Potassium 2.9, Chloride 114, Phosphorus 2.4, Magnesium 1.3, eGFR 57. On 7/10 Ceruloplasmin 14.1, vitamin B12 574, Iron 87, Folate 5.1, TIBC was unable to be calculated. Still pending zinc and copper levels.  I/O: 270 mL UOP yesterday + one unmeasured urine occurrence; 400 mL output from colostomy yesterday  Discussed with RN and on rounds. Plan is to initiate TPN today.  Diet Order:   Diet Order    None      EDUCATION NEEDS:   Education needs have been addressed  Skin:  Skin Assessment: Reviewed RN Assessment(closed incision to abdomen)  Last BM:  04/06/2018 - 400 mL output in colostomy  Height:   Ht Readings from Last 1 Encounters:  04/06/18 5' 2" (1.575 m)    Weight:   Wt Readings from Last 1 Encounters:  04/06/18 98 lb 12.3 oz (  44.8 kg)    Ideal Body Weight:  50 kg  BMI:  Body mass index is 18.06 kg/m.  Estimated Nutritional Needs:   Kcal:  1028 (PSU 2003b w/ MSJ 946, Ve 4.4, Tmax 37.1)  Protein:  67-80 grams (1.5-1.8 grams/kg)  Fluid:  1.3-1.6 L/day (30-35 mL/kg)  Willey Blade, MS, RD, LDN Office: [phone] Pager: [phone] After  Hours/Weekend Pager: [phone]

## 2018-04-08 ENCOUNTER — Encounter: Payer: Self-pay | Admitting: Vascular Surgery

## 2018-04-08 DIAGNOSIS — K922 Gastrointestinal hemorrhage, unspecified: Secondary | ICD-10-CM

## 2018-04-08 DIAGNOSIS — E46 Unspecified protein-calorie malnutrition: Secondary | ICD-10-CM

## 2018-04-08 DIAGNOSIS — Z978 Presence of other specified devices: Secondary | ICD-10-CM

## 2018-04-08 LAB — COMPREHENSIVE METABOLIC PANEL
ALBUMIN: 1.5 g/dL — AB (ref 3.5–5.0)
ALT: 13 U/L (ref 0–44)
AST: 15 U/L (ref 15–41)
Alkaline Phosphatase: 63 U/L (ref 38–126)
Anion gap: 2 — ABNORMAL LOW (ref 5–15)
BUN: 16 mg/dL (ref 8–23)
CHLORIDE: 117 mmol/L — AB (ref 98–111)
CO2: 24 mmol/L (ref 22–32)
CREATININE: 0.81 mg/dL (ref 0.44–1.00)
Calcium: 6.7 mg/dL — ABNORMAL LOW (ref 8.9–10.3)
GFR calc Af Amer: >60 mL/min (ref >60)
GLUCOSE: 97 mg/dL (ref 70–99)
Potassium: 3.5 mmol/L (ref 3.5–5.1)
Sodium: 143 mmol/L (ref 135–145)
Total Bilirubin: 0.6 mg/dL (ref 0.3–1.2)
Total Protein: 3.9 g/dL — ABNORMAL LOW (ref 6.5–8.1)

## 2018-04-08 LAB — GLUCOSE, CAPILLARY
GLUCOSE-CAPILLARY: 112 mg/dL — AB (ref 70–99)
GLUCOSE-CAPILLARY: 133 mg/dL — AB (ref 70–99)
GLUCOSE-CAPILLARY: 98 mg/dL (ref 70–99)
Glucose-Capillary: 101 mg/dL — ABNORMAL HIGH (ref 70–99)
Glucose-Capillary: 69 mg/dL — ABNORMAL LOW (ref 70–99)
Glucose-Capillary: 75 mg/dL (ref 70–99)
Glucose-Capillary: 97 mg/dL (ref 70–99)
Glucose-Capillary: 98 mg/dL (ref 70–99)

## 2018-04-08 LAB — PREPARE FRESH FROZEN PLASMA
UNIT DIVISION: 0
Unit division: 0

## 2018-04-08 LAB — CBC
HCT: 36 % (ref 35.0–47.0)
HEMOGLOBIN: 12.4 g/dL (ref 12.0–16.0)
MCH: 32.2 pg (ref 26.0–34.0)
MCHC: 34.5 g/dL (ref 32.0–36.0)
MCV: 93.4 fL (ref 80.0–100.0)
Platelets: 156 10*3/uL (ref 150–440)
RBC: 3.86 MIL/uL (ref 3.80–5.20)
RDW: 15.8 % — ABNORMAL HIGH (ref 11.5–14.5)
WBC: 5.7 10*3/uL (ref 3.6–11.0)

## 2018-04-08 LAB — BPAM FFP
Blood Product Expiration Date: 201907152359
Blood Product Expiration Date: 201907152359
ISSUE DATE / TIME: 201907102151
ISSUE DATE / TIME: 201907110150
UNIT TYPE AND RH: 5100
Unit Type and Rh: 9500

## 2018-04-08 LAB — DIFFERENTIAL
BASOS PCT: 1 %
Basophils Absolute: 0 10*3/uL (ref 0–0.1)
Eosinophils Absolute: 0.5 10*3/uL (ref 0–0.7)
Eosinophils Relative: 9 %
LYMPHS PCT: 22 %
Lymphs Abs: 1.3 10*3/uL (ref 1.0–3.6)
MONO ABS: 0.4 10*3/uL (ref 0.2–0.9)
MONOS PCT: 6 %
NEUTROS PCT: 62 %
Neutro Abs: 3.6 10*3/uL (ref 1.4–6.5)

## 2018-04-08 LAB — PHOSPHORUS
PHOSPHORUS: 1.9 mg/dL — AB (ref 2.5–4.6)
Phosphorus: 2 mg/dL — ABNORMAL LOW (ref 2.5–4.6)

## 2018-04-08 LAB — ZINC: ZINC: 56 ug/dL (ref 56–134)

## 2018-04-08 LAB — POTASSIUM: Potassium: 3.8 mmol/L (ref 3.5–5.1)

## 2018-04-08 LAB — HEMOGLOBIN AND HEMATOCRIT, BLOOD
HEMATOCRIT: 36 % (ref 35.0–47.0)
Hemoglobin: 12.5 g/dL (ref 12.0–16.0)

## 2018-04-08 LAB — MAGNESIUM: MAGNESIUM: 2.1 mg/dL (ref 1.7–2.4)

## 2018-04-08 LAB — COPPER, SERUM: Copper: 72 ug/dL (ref 72–166)

## 2018-04-08 MED ORDER — POTASSIUM PHOSPHATES 15 MMOLE/5ML IV SOLN
15.0000 mmol | Freq: Once | INTRAVENOUS | Status: AC
  Administered 2018-04-08: 15 mmol via INTRAVENOUS
  Filled 2018-04-08: qty 5

## 2018-04-08 MED ORDER — FENTANYL CITRATE (PF) 100 MCG/2ML IJ SOLN
25.0000 ug | INTRAMUSCULAR | Status: DC | PRN
  Administered 2018-04-08 – 2018-04-09 (×3): 25 ug via INTRAVENOUS
  Filled 2018-04-08 (×3): qty 2

## 2018-04-08 MED ORDER — ENSURE ENLIVE PO LIQD
237.0000 mL | Freq: Two times a day (BID) | ORAL | Status: DC
  Administered 2018-04-08 – 2018-04-11 (×5): 237 mL via ORAL

## 2018-04-08 MED ORDER — TRACE MINERALS CR-CU-MN-SE-ZN 10-1000-500-60 MCG/ML IV SOLN
INTRAVENOUS | Status: AC
  Administered 2018-04-08: 18:00:00 via INTRAVENOUS
  Filled 2018-04-08: qty 1440

## 2018-04-08 MED ORDER — OXYCODONE HCL 5 MG PO TABS
5.0000 mg | ORAL_TABLET | ORAL | Status: DC | PRN
  Administered 2018-04-08 – 2018-04-14 (×31): 5 mg via ORAL
  Filled 2018-04-08 (×32): qty 1

## 2018-04-08 MED ORDER — POTASSIUM PHOSPHATES 15 MMOLE/5ML IV SOLN
20.0000 mmol | Freq: Once | INTRAVENOUS | Status: AC
  Administered 2018-04-08: 20 mmol via INTRAVENOUS
  Filled 2018-04-08: qty 6.67

## 2018-04-08 MED ORDER — FAT EMULSION PLANT BASED 20 % IV EMUL
250.0000 mL | INTRAVENOUS | Status: AC
Start: 2018-04-08 — End: 2018-04-09
  Administered 2018-04-08: 250 mL via INTRAVENOUS
  Filled 2018-04-08: qty 250

## 2018-04-08 MED ORDER — DIPHENOXYLATE-ATROPINE 2.5-0.025 MG PO TABS: 1.0000 | ORAL_TABLET | Freq: Four times a day (QID) | ORAL | Status: DC | PRN

## 2018-04-08 MED ORDER — INSULIN ASPART 100 UNIT/ML ~~LOC~~ SOLN
0.0000 [IU] | SUBCUTANEOUS | Status: DC
  Administered 2018-04-08: 1 [IU] via SUBCUTANEOUS
  Filled 2018-04-08: qty 1

## 2018-04-08 NOTE — Progress Notes (Signed)
Cephas Darby, MD [address]  [place]  [place], [place] [address]  Main: [phone]  Fax: [phone] Pager: [phone]   Subjective: Patient is extubated. She underwent embolization of the gastroduodenal artery by vascular surgery yesterday. She is tolerating full liquid diet. She is concerned about ostomy leakage. Her son is bedside. Her hemoglobin is stable within last 24 hours Patient is started on TPN  Objective: Vital signs in last 24 hours: Vitals:   04/08/18 1100 04/08/18 1200 04/08/18 1300 04/08/18 1400  BP: (!) 133/99 129/79 108/63 138/81  Pulse: 96  85 86  Resp: (!) '21 19 18 '$ (!) 21  Temp:  98.4 F (36.9 C)  98.6 F (37 C)  TempSrc:  Axillary  Axillary  SpO2: 92%  99% 96%  Weight:      Height:       Weight change: 12 lb 12 oz (5.784 kg)  Intake/Output Summary (Last 24 hours) at 04/08/2018 1626 Last data filed at 04/08/2018 1404 Gross per 24 hour  Intake 2407.42 ml  Output 750 ml  Net 1657.42 ml     Exam: Heart:: Regular rate and rhythm or S1S2 present Lungs: normal and clear to auscultation Abdomen: soft, nontender, normal bowel sounds Large gaping wound from recent surgery in the mid abdomen Liquid black stool in the ostomy bag   Lab Results: CBC Latest Ref Rng & Units 04/08/2018 04/08/2018 04/07/2018  WBC 3.6 - 11.0 K/uL 5.7 - -  Hemoglobin 12.0 - 16.0 g/dL 12.4 12.5 12.5  Hematocrit 35.0 - 47.0 % 36.0 36.0 36.2  Platelets 150 - 440 K/uL 156 - -   Hepatic Function Latest Ref Rng & Units 04/08/2018 04/06/2018 04/05/2018  Total Protein 6.5 - 8.1 g/dL 3.9(L) 3.4(L) 5.0(L)  Albumin 3.5 - 5.0 g/dL 1.5(L) 1.1(L) 1.7(L)  AST 15 - 41 U/L 15 24 32  ALT 0 - 44 U/L '13 15 24  '$ Alk Phosphatase 38 - 126 U/L 63 74 114  Total Bilirubin 0.3 - 1.2 mg/dL 0.6 0.5 0.6  Bilirubin, Direct 0.0 - 0.2 mg/dL - - 0.2   BMP Latest Ref Rng & Units 04/08/2018 04/07/2018 04/06/2018  Glucose 70 - 99 mg/dL 97 65(L) 116(H)  BUN 8 - 23 mg/dL '16 18 22  '$ Creatinine 0.44  - 1.00 mg/dL 0.81 1.00 1.31(H)  BUN/Creat Ratio 12 - 28 - - -  Sodium 135 - 145 mmol/L 143 141 140  Potassium 3.5 - 5.1 mmol/L 3.5 2.9(L) 3.6  Chloride 98 - 111 mmol/L 117(H) 114(H) 111  CO2 22 - 32 mmol/L '24 23 23  '$ Calcium 8.9 - 10.3 mg/dL 6.7(L) 6.5(L) 7.1(L)   Micro Results: Recent Results (from the past 240 hour(s))  Blood Culture (routine x 2)     Status: None (Preliminary result)   Collection Time: 04/05/18  7:08 PM  Result Value Ref Range Status   Specimen Description BLOOD BLOOD RIGHT WRIST  Final   Special Requests   Final    BOTTLES DRAWN AEROBIC AND ANAEROBIC Blood Culture adequate volume   Culture   Final    NO GROWTH 3 DAYS Performed at [facility], [address] [place],  [address]    Report Status PENDING  Incomplete  Blood Culture (routine x 2)     Status: None (Preliminary result)   Collection Time: 04/05/18  7:09 PM  Result Value Ref Range Status   Specimen Description BLOOD LEFT ANTECUBITAL  Final   Special Requests   Final    BOTTLES  DRAWN AEROBIC AND ANAEROBIC Blood Culture results may not be optimal due to an inadequate volume of blood received in culture bottles   Culture   Final    NO GROWTH 3 DAYS Performed at [facility], [address] [place], [place] [address]    Report Status PENDING  Incomplete  MRSA PCR Screening     Status: None   Collection Time: 04/06/18  8:09 PM  Result Value Ref Range Status   MRSA by PCR NEGATIVE NEGATIVE Final    Comment:        The GeneXpert MRSA Assay (FDA approved for NASAL specimens only), is one component of a comprehensive MRSA colonization surveillance program. It is not intended to diagnose MRSA infection nor to guide or monitor treatment for MRSA infections. Performed at [facility], [address] [place], [place] [address]    Studies/Results: Dg Abd 1 View  Result Date: 04/06/2018 CLINICAL DATA:  Orogastric tube placement. EXAM: ABDOMEN - 1 VIEW  COMPARISON:  Radiograph of March 06, 2018. FINDINGS: The bowel gas pattern is normal. No radio-opaque calculi or other significant radiographic abnormality are seen. Distal tip of nasogastric tube is seen in proximal stomach. IMPRESSION: No evidence of bowel obstruction or ileus. Distal tip of nasogastric tube is seen in proximal stomach. Electronically Signed   By: Marijo Conception, M.D.   On: 04/06/2018 20:32   Dg Chest Port 1 View  Result Date: 04/07/2018 CLINICAL DATA:  Encounter for central line placement EXAM: PORTABLE CHEST 1 VIEW COMPARISON:  Earlier today FINDINGS: New left IJ line with tip at the SVC. No pneumothorax or mediastinal widening. Endotracheal tube tip between the clavicular heads and carina. An orogastric tube tip is at the gastric fundus. The lungs remain clear. Normal heart size IMPRESSION: 1. No adverse finding after left IJ line placement. 2. No evidence of active cardiopulmonary disease. Electronically Signed   By: Monte Fantasia M.D.   On: 04/07/2018 00:14   Dg Chest Port 1 View  Result Date: 04/06/2018 CLINICAL DATA:  Endotracheal tube placement. EXAM: PORTABLE CHEST 1 VIEW COMPARISON:  Radiographs of April 05, 2018. FINDINGS: The heart size and mediastinal contours are within normal limits. Endotracheal tube is seen projected over tracheal air shadow with distal tip 2 cm above the carina. Nasogastric tube tip is seen in proximal stomach. There is no evidence of bowel obstruction or ileus. Both lungs are clear. Stable old proximal right humeral fracture is noted. IMPRESSION: Endotracheal and nasogastric tubes are in grossly good position. No acute pulmonary abnormality is noted. Electronically Signed   By: Marijo Conception, M.D.   On: 04/06/2018 20:31   [place] Ekg Site Rite  Result Date: 04/07/2018 If Site Rite image not attached, placement could not be confirmed due to current cardiac rhythm.  Medications: I have reviewed the patient's current medications. Scheduled Meds: .  chlorhexidine gluconate (MEDLINE KIT)  15 mL Mouth Rinse BID  . feeding supplement (ENSURE ENLIVE)  237 mL Oral BID BM  . insulin aspart  0-9 Units Subcutaneous Q4H  . mouth rinse  15 mL Mouth Rinse 10 times per day  . [START ON 04/09/2018] pantoprazole  40 mg Intravenous Q12H  . sodium chloride flush  10-40 mL Intracatheter Q12H   Continuous Infusions: . Marland KitchenTPN (CLINIMIX-E) Adult 30 mL/hr at 04/08/18 0700  . Marland KitchenTPN (CLINIMIX-E) Adult    . Fat emulsion    . pantoprozole (PROTONIX) infusion 8 mg/hr (04/08/18 1517)   PRN Meds:.acetaminophen **OR** acetaminophen, bisacodyl,  diphenoxylate-atropine, fentaNYL (SUBLIMAZE) injection, [DISCONTINUED] ondansetron **OR** ondansetron (ZOFRAN) IV, oxyCODONE, sodium chloride flush   Assessment: Active Problems:   Symptomatic anemia   Hematemesis  Acutely bleeding duodenal ulcer, status post embolization of the gastroduodenal artery Bleeding has currently stopped, hemoglobin is stable within last 24 hours Patient is extubated  Plan: Continue pantoprazole drip for another 24 hours then switch to Protonix 40 mg twice daily rest of her life Continue TPN and patient might benefit from it as outpatient given recent surgery and severe protein calorie malnutrition Advance diet as tolerated Recommend to start low-dose Lomotil to regulate high ostomy output Monitor CBC daily Follow-up with GI and general surgery upon discharge   LOS: 2 days   Jimi Schappert 04/08/2018, 4:26 PM

## 2018-04-08 NOTE — Progress Notes (Signed)
Status post embolization 7/11.  No further leading. Passed SBT this a.m. and now extubated.  Tolerating well Cognition intact  Vitals:   04/08/18 0900 04/08/18 1000  BP: 119/81 125/76  Pulse: 86   Resp: 19 10  Temp:    SpO2: 99%     Gen:  NAD HEENT: NCAT, sclerae whitel Neck: No LAN, no JVD noted Lungs: full BS, no adventitious sounds Cardiovascular: Regular, no M noted Abdomen: Soft, NT, +BS Ext: no C/C/E Neuro: PERRL, EOMI, motor/sensory grossly intact Skin: No lesions noted   BMP Latest Ref Rng & Units 04/08/2018 04/07/2018 04/06/2018  Glucose 70 - 99 mg/dL 97 95(A65(L) 213(Y116(H)  BUN 8 - 23 mg/dL 16 18 22   Creatinine 0.44 - 1.00 mg/dL 8.650.81 7.841.00 6.96(E1.31(H)  BUN/Creat Ratio 12 - 28 - - -  Sodium 135 - 145 mmol/L 143 141 140  Potassium 3.5 - 5.1 mmol/L 3.5 2.9(L) 3.6  Chloride 98 - 111 mmol/L 117(H) 114(H) 111  CO2 22 - 32 mmol/L 24 23 23   Calcium 8.9 - 10.3 mg/dL 6.7(L) 6.5(L) 7.1(L)   CBC Latest Ref Rng & Units 04/08/2018 04/08/2018 04/07/2018  WBC 3.6 - 11.0 K/uL 5.7 - -  Hemoglobin 12.0 - 16.0 g/dL 95.212.4 84.112.5 32.412.5  Hematocrit 35.0 - 47.0 % 36.0 36.0 36.2  Platelets 150 - 440 K/uL 156 - -   No new CXR  IMPRESSION: Later dependence, resolved UGI bleed, appears to be resolved after embolization procedure Recent colostomy Protein-calorie malnutrition  PLAN/REC: Monitor and ICU/SDU post extubation Supplemental oxygen as needed Monitor hemodynamics DVT px: SCDs Monitor CBC intermittently Transfuse per usual guidelines  TPN ordered PICC line ordered Advance diet and activity as able  Billy Fischeravid Simonds, MD PCCM service Mobile [phone] Pager [phone] 04/08/2018 1:38 PM

## 2018-04-08 NOTE — Progress Notes (Signed)
PHARMACY - ADULT TOTAL PARENTERAL NUTRITION CONSULT NOTE   Pharmacy Consult for TPN Indication: severe malnutrition   Patient Measurements: Height: 5\' 2"  (157.5 cm) Weight: 110 lb 7.2 oz (50.1 kg) IBW/kg (Calculated) : 50.1 TPN AdjBW (KG): 44.8 Body mass index is 20.2 kg/m.   Assessment:  Pharmacy consulted for TPN management for 67 yo female admitted with upper GI bleed. Patient with recent history significant for partial colectomy and ostomy for perforated sigmoid diverticulitis. Patient with embolization on 7/11.   TPN Access: [facility]J, PICC placement pending  TPN start date: 7/11 Nutritional Goals (per RD recommendation on 7/12): KCal: 1200-1400kcal/day Protein: 66-75g/day  Fluid: > 1.3 L/day   Goal TPN rate is 60 mL/hr   Current Nutrition:   Plan:  Clinimix E 5/15 at 4960mL/hr 20% lipids at 3615mL/hr x 12 hours.    Potassium phosphate 20mmoL IV x 1.   Add MVI, trace elements 2mL, folic acid and thiamine   SSI Q4hr.   Monitor TPN labs, Daily x 3 days and as indicated   7/12 1820 K: 3.8, Phos: 1.9. Will order KPhos 15mmol IV x 1 dose. Will F/U with AM labs.    Gardner CandleSheema M Tomekia Helton, PharmD, BCPS Clinical Pharmacist 04/08/2018 8:00 PM

## 2018-04-08 NOTE — Progress Notes (Signed)
[place] at [facility]                                                                                                                                                                                  Patient Demographics   Annette Miller, is a 67 y.o. female, DOB - October 29, 1950, VQQ:[id number]  Admit date - 04/05/2018   Admitting Physician Amelia Jo, MD  Outpatient Primary MD for the patient is Guadalupe Maple, MD   LOS - 2  Subjective: Patient more awake plan for extubation today Able to follow commands   Review of Systems:   CONSTITUTIONAL: On the ventilator Vitals:   Vitals:   04/08/18 1100 04/08/18 1200 04/08/18 1300 04/08/18 1400  BP: (!) 133/99 129/79 108/63 138/81  Pulse: 96  85 86  Resp: (!) '21 19 18 '$ (!) 21  Temp:  98.4 F (36.9 C)  98.6 F (37 C)  TempSrc:  Axillary  Axillary  SpO2: 92%  99% 96%  Weight:      Height:        Wt Readings from Last 3 Encounters:  04/08/18 50.1 kg (110 lb 7.2 oz)  03/24/18 38.8 kg (85 lb 9.6 oz)  03/01/18 50 kg (110 lb 3.7 oz)     Intake/Output Summary (Last 24 hours) at 04/08/2018 1504 Last data filed at 04/08/2018 1404 Gross per 24 hour  Intake 2367.42 ml  Output 750 ml  Net 1617.42 ml    Physical Exam:   GENERAL: Critically ill sedated HEAD, EYES, EARS, NOSE AND THROAT: Atraumatic, normocephalic. Pupils equal and reactive to light. Sclerae anicteric. No conjunctival injection. No oro-pharyngeal erythema.  NECK: Supple. There is no jugular venous distention. No bruits, no lymphadenopathy, no thyromegaly.  HEART: Regular rate and rhythm,. No murmurs, no rubs, no clicks.  LUNGS: Clear to auscultation bilaterally. No rales or rhonchi. No wheezes.  ABDOMEN: Soft, flat, nontender, nondistended.  Colostomy bag is in place EXTREMITIES: No evidence of any cyanosis, clubbing, or peripheral edema.  +2 pedal and radial pulses bilaterally.  NEUROLOGIC: Follow commands SKIN: Moist and warm with no rashes  appreciated.  Psych: Follow commands LN: No inguinal LN enlargement    Antibiotics   Anti-infectives (From admission, onward)   Start     Dose/Rate Route Frequency Ordered Stop   04/06/18 0515  metroNIDAZOLE (FLAGYL) IVPB 500 mg  Status:  Discontinued     500 mg 100 mL/hr over 60 Minutes Intravenous Every 8 hours 04/06/18 0504 04/07/18 1022   04/06/18 0100  piperacillin-tazobactam (ZOSYN) IVPB 3.375 g  Status:  Discontinued     3.375 g 12.5 mL/hr over 240 Minutes Intravenous Every 8 hours 04/06/18 0045  04/08/18 0725   04/05/18 1845  piperacillin-tazobactam (ZOSYN) IVPB 3.375 g     3.375 g 100 mL/hr over 30 Minutes Intravenous  Once 04/05/18 1840 04/05/18 1955      Medications   Scheduled Meds: . chlorhexidine gluconate (MEDLINE KIT)  15 mL Mouth Rinse BID  . feeding supplement (ENSURE ENLIVE)  237 mL Oral BID BM  . mouth rinse  15 mL Mouth Rinse 10 times per day  . [START ON 04/09/2018] pantoprazole  40 mg Intravenous Q12H  . sodium chloride flush  10-40 mL Intracatheter Q12H   Continuous Infusions: . Marland KitchenTPN (CLINIMIX-E) Adult 30 mL/hr at 04/08/18 0700  . Marland KitchenTPN (CLINIMIX-E) Adult    . Fat emulsion    . pantoprozole (PROTONIX) infusion 8 mg/hr (04/08/18 0700)   PRN Meds:.acetaminophen **OR** acetaminophen, bisacodyl, diphenoxylate-atropine, fentaNYL (SUBLIMAZE) injection, [DISCONTINUED] ondansetron **OR** ondansetron (ZOFRAN) IV, oxyCODONE, sodium chloride flush   Data Review:   Micro Results Recent Results (from the past 240 hour(s))  Blood Culture (routine x 2)     Status: None (Preliminary result)   Collection Time: 04/05/18  7:08 PM  Result Value Ref Range Status   Specimen Description BLOOD BLOOD RIGHT WRIST  Final   Special Requests   Final    BOTTLES DRAWN AEROBIC AND ANAEROBIC Blood Culture adequate volume   Culture   Final    NO GROWTH 3 DAYS Performed at [facility], [address] [place], [place] [address]    Report Status PENDING  Incomplete   Blood Culture (routine x 2)     Status: None (Preliminary result)   Collection Time: 04/05/18  7:09 PM  Result Value Ref Range Status   Specimen Description BLOOD LEFT ANTECUBITAL  Final   Special Requests   Final    BOTTLES DRAWN AEROBIC AND ANAEROBIC Blood Culture results may not be optimal due to an inadequate volume of blood received in culture bottles   Culture   Final    NO GROWTH 3 DAYS Performed at [facility], [address], [place], [place] [address]    Report Status PENDING  Incomplete  MRSA PCR Screening     Status: None   Collection Time: 04/06/18  8:09 PM  Result Value Ref Range Status   MRSA by PCR NEGATIVE NEGATIVE Final    Comment:        The GeneXpert MRSA Assay (FDA approved for NASAL specimens only), is one component of a comprehensive MRSA colonization surveillance program. It is not intended to diagnose MRSA infection nor to guide or monitor treatment for MRSA infections. Performed at [facility], [address] [place],  [address]     Radiology Reports Dg Chest 2 View  Result Date: 04/05/2018 CLINICAL DATA:  Chest pain EXAM: CHEST - 2 VIEW COMPARISON:  March 04, 2018 FINDINGS: Nonacute proximal right humeral fracture. The heart, hila, mediastinum, lungs, and pleura are otherwise normal. IMPRESSION: Nonacute proximal right humeral fracture.  No acute abnormality. Electronically Signed   By: Dorise Bullion III M.D   On: 04/05/2018 18:17   Dg Abd 1 View  Result Date: 04/06/2018 CLINICAL DATA:  Orogastric tube placement. EXAM: ABDOMEN - 1 VIEW COMPARISON:  Radiograph of March 06, 2018. FINDINGS: The bowel gas pattern is normal. No radio-opaque calculi or other significant radiographic abnormality are seen. Distal tip of nasogastric tube is seen in proximal stomach. IMPRESSION: No evidence of bowel obstruction or ileus. Distal tip of nasogastric tube is seen in proximal stomach. Electronically Signed  By: Marijo Conception, M.D.   On:  04/06/2018 20:32   Ct Abdomen Pelvis W Contrast  Result Date: 04/05/2018 CLINICAL DATA:  Left upper quadrant abdominal pain with nausea and vomiting. Recent partial colectomy and colostomy on 02/25/2018 for perforated sigmoid diverticulitis. EXAM: CT ABDOMEN AND PELVIS WITH CONTRAST TECHNIQUE: Multidetector CT imaging of the abdomen and pelvis was performed using the standard protocol following bolus administration of intravenous contrast. CONTRAST:  74m OMNIPAQUE IOHEXOL 300 MG/ML  SOLN COMPARISON:  CT abdomen pelvis dated Feb 25, 2018. FINDINGS: Lower chest: No acute abnormality. Hepatobiliary: Diffuse hepatic steatosis. The gallbladder remains mildly distended. No gallbladder wall thickening or gallstones. Unchanged mild dilatation of the common bile duct, measuring up to 10 mm. Pancreas: Unremarkable. No pancreatic ductal dilatation or surrounding inflammatory changes. Spleen: Normal in size without focal abnormality. Adrenals/Urinary Tract: Adrenal glands are unremarkable. Kidneys are normal, without renal calculi, focal lesion, or hydronephrosis. Bladder is unremarkable. Stomach/Bowel: Postsurgical changes related to sigmoid colectomy with left lower quadrant colostomy. There is moderate circumferential wall thickening of the proximal descending colon extending to the ostomy. The appendix is surgically absent. Improving but persistent wall thickening along the antrum of the stomach and first portion of the duodenum. Fecalization of distal small bowel contents likely represents delayed transit. No bowel obstruction. Vascular/Lymphatic: Aortic atherosclerosis. No enlarged abdominal or pelvic lymph nodes. Reproductive: Status post hysterectomy. No adnexal masses. Other: 2.0 x 2.5 cm rounded area of inflammatory fat stranding in the right mid abdomen. No free fluid or pneumoperitoneum. Mild presacral inflammatory stranding. Open midline abdominal surgical incision. Musculoskeletal: No acute or significant  osseous findings. IMPRESSION: 1. Postsurgical changes related to sigmoid colectomy with left lower quadrant colostomy. Moderate circumferential wall thickening of the proximal descending colon extending to the ostomy, consistent with colitis. 2. Improving but persistent wall thickening along the antrum of the stomach and first portion of the duodenum with adjacent prominent lymph nodes which are slightly increased in size, now measuring up to 8 mm in short axis. Correlation with endoscopy is recommended if not previously performed. 3. Unchanged mild dilatation of the common bile duct and gallbladder distention. Correlate with LFTs and consider further evaluation with ERCP or MRCP as clinically indicated. 4. New 2.0 x 2.5 cm rounded area of inflammatory fat stranding in the right mid abdomen may represent omental infarct. Electronically Signed   By: WTitus DubinM.D.   On: 04/05/2018 21:32   Dg Chest Port 1 View  Result Date: 04/07/2018 CLINICAL DATA:  Encounter for central line placement EXAM: PORTABLE CHEST 1 VIEW COMPARISON:  Earlier today FINDINGS: New left IJ line with tip at the SVC. No pneumothorax or mediastinal widening. Endotracheal tube tip between the clavicular heads and carina. An orogastric tube tip is at the gastric fundus. The lungs remain clear. Normal heart size IMPRESSION: 1. No adverse finding after left IJ line placement. 2. No evidence of active cardiopulmonary disease. Electronically Signed   By: JMonte FantasiaM.D.   On: 04/07/2018 00:14   Dg Chest Port 1 View  Result Date: 04/06/2018 CLINICAL DATA:  Endotracheal tube placement. EXAM: PORTABLE CHEST 1 VIEW COMPARISON:  Radiographs of April 05, 2018. FINDINGS: The heart size and mediastinal contours are within normal limits. Endotracheal tube is seen projected over tracheal air shadow with distal tip 2 cm above the carina. Nasogastric tube tip is seen in proximal stomach. There is no evidence of bowel obstruction or ileus. Both lungs  are clear. Stable old proximal right humeral fracture  is noted. IMPRESSION: Endotracheal and nasogastric tubes are in grossly good position. No acute pulmonary abnormality is noted. Electronically Signed   By: Marijo Conception, M.D.   On: 04/06/2018 20:31   [place] Ekg Site Rite  Result Date: 04/07/2018 If Site Rite image not attached, placement could not be confirmed due to current cardiac rhythm.    CBC Recent Labs  Lab 04/05/18 1908 04/06/18 0411 04/06/18 1926 04/07/18 0644 04/07/18 1533 04/07/18 2027 04/08/18 0232 04/08/18 0554  WBC 8.2 8.1 6.9  --   --   --   --  5.7  HGB 5.9* 7.3* 5.8* 12.1 12.5 12.5 12.5 12.4  HCT 17.6* 21.1* 16.8* 34.6* 35.6 36.2 36.0 36.0  PLT 407 362 335  --   --   --   --  156  MCV 108.6* 102.6* 103.9*  --   --   --   --  93.4  MCH 36.7* 35.5* 36.1*  --   --   --   --  32.2  MCHC 33.8 34.6 34.7  --   --   --   --  34.5  RDW 24.2* 21.7* 22.1*  --   --   --   --  15.8*  LYMPHSABS  --   --   --   --   --   --   --  1.3  MONOABS  --   --   --   --   --   --   --  0.4  EOSABS  --   --   --   --   --   --   --  0.5  BASOSABS  --   --   --   --   --   --   --  0.0    Chemistries  Recent Labs  Lab 04/05/18 1908 04/05/18 1909 04/06/18 0411 04/06/18 1926 04/07/18 0644 04/08/18 0554  NA 136  --  137 140 141 143  K 4.2  --  3.5 3.6 2.9* 3.5  CL 98  --  105 111 114* 117*  CO2 30  --  '27 23 23 24  '$ GLUCOSE 124*  --  62* 116* 65* 97  BUN 20  --  '20 22 18 16  '$ CREATININE 1.67*  --  1.31* 1.31* 1.00 0.81  CALCIUM 10.2  --  8.3* 7.1* 6.5* 6.7*  MG  --   --   --   --  1.3* 2.1  AST  --  32  --  24  --  15  ALT  --  24  --  15  --  13  ALKPHOS  --  114  --  74  --  63  BILITOT  --  0.6  --  0.5  --  0.6   ------------------------------------------------------------------------------------------------------------------ estimated creatinine clearance is 54 mL/min (by C-G formula based on SCr of 0.81  mg/dL). ------------------------------------------------------------------------------------------------------------------ No results for input(s): HGBA1C in the last 72 hours. ------------------------------------------------------------------------------------------------------------------ No results for input(s): CHOL, HDL, LDLCALC, TRIG, CHOLHDL, LDLDIRECT in the last 72 hours. ------------------------------------------------------------------------------------------------------------------ No results for input(s): TSH, T4TOTAL, T3FREE, THYROIDAB in the last 72 hours.  Invalid input(s): FREET3 ------------------------------------------------------------------------------------------------------------------ Recent Labs    04/06/18 0411 04/06/18 1128  VITAMINB12  --  574  FOLATE 5.1*  --   FERRITIN 127  --   TIBC NOT CALCULATED  --   IRON 87  --     Coagulation profile Recent Labs  Lab 04/06/18 1925  INR 1.30  No results for input(s): DDIMER in the last 72 hours.  Cardiac Enzymes Recent Labs  Lab 04/05/18 1908 04/05/18 2211  TROPONINI 0.03* 0.03*   ------------------------------------------------------------------------------------------------------------------ Invalid input(s): POCBNP    Assessment & Plan   1. upper GI bleed with a large duodenal ulcer Status post vascular embolization Hemoglobin stable Check CBC in the morning Continue Protonix drip  2.  Acute respiratory failure continue ventilator support Plan for extubation   3.  Possible.  Acute colitis with sepsis continue IV Zosyn  4.  Acute UTI.    Continue IV antibiotic  5.  Severe anemia, likely multifactorial, related to chronic anemia, worsened by acute blood loss transfuse as needed  6.  Acute renal failure, likely prerenal.    Continue IV fluids follow renal function  7.  Tobacco abuse.  Nicotine patch when awake         Code Status Orders  (From admission, onward)         Start     Ordered   04/06/18 0229  Full code  Continuous     04/06/18 0229    Code Status History    Date Active Date Inactive Code Status Order ID Comments User Context   02/25/2018 1326 03/12/2018 1730 Full Code [id number]  Hadley Pen, DO Inpatient    Advance Directive Documentation     Most Recent Value  Type of Advance Directive  Healthcare Power of [place], Living will  Pre-existing out of facility DNR order (yellow form or pink MOST form)  -  "MOST" Form in Place?  -           Consults GI ,SURGERY   DVT Prophylaxis  SCD'S  Lab Results  Component Value Date   PLT 156 04/08/2018     Time Spent in minutes   35MIN  Greater than 50% of time spent in care coordination and counseling patient regarding the condition and plan of care.   Dustin Flock M.D on 04/08/2018 at 3:04 PM  Between 7am to 6pm - Pager - [phone]  After 6pm go to [email or web address] - Proofreader  [employer]   Office  [phone]

## 2018-04-08 NOTE — Progress Notes (Signed)
Extubated without complications to 2lnc 

## 2018-04-08 NOTE — Care Management (Signed)
Patient presented with nausea vomiting abdominal pain.  Recent hospitalization for colectomy and ostomy for perforated sigmoid diverticulitis.  Was referred to [facility] for PT RN and rolling walker. Will need to to reach out to agency to determine if still following. Transferred to icu after another bleeding episode and required intubation.  Vascular performed embolization.   Currently is now extubated.  For PICC line and TPN.

## 2018-04-08 NOTE — Progress Notes (Signed)
PHARMACY - ADULT TOTAL PARENTERAL NUTRITION CONSULT NOTE   Pharmacy Consult for TPN Indication: severe malnutrition   Patient Measurements: Height: 5\' 2"  (157.5 cm) Weight: 110 lb 7.2 oz (50.1 kg) IBW/kg (Calculated) : 50.1 TPN AdjBW (KG): 44.8 Body mass index is 20.2 kg/m.   Assessment:  Pharmacy consulted for TPN management for 67 yo female admitted with upper GI bleed. Patient with recent history significant for partial colectomy and ostomy for perforated sigmoid diverticulitis. Patient with embolization on 7/11.   TPN Access: LIJ, PICC placement pending  TPN start date: 7/11 Nutritional Goals (per RD recommendation on 7/12): KCal: 1200-1400kcal/day Protein: 66-75g/day  Fluid: > 1.3 L/day   Goal TPN rate is 60 mL/hr   Current Nutrition:   Plan:  Clinimix E 5/15 at 4060mL/hr 20% lipids at 7515mL/hr x 12 hours.    Potassium phosphate 20mmoL IV x 1.   Add MVI, trace elements 2mL, folic acid and thiamine   SSI Q4hr.   Monitor TPN labs, Daily x 3 days and as indicated   F/U potassium/phosphorus at 1800. AM Labs   Dace Denn L 04/08/2018,2:31 PM

## 2018-04-08 NOTE — Progress Notes (Signed)
CC: GI bleed Subjective: S/p embolization, HD stable, hb stable no melenea or hematemesis Extubated this am  Objective: Vital signs in last 24 hours: Temp:  [96.3 F (35.7 C)-97.6 F (36.4 C)] 97.6 F (36.4 C) (07/12 0800) Pulse Rate:  [59-86] 86 (07/12 0900) Resp:  [10-19] 10 (07/12 1000) BP: (82-125)/(41-81) 125/76 (07/12 1000) SpO2:  [97 %-100 %] 99 % (07/12 0900) FiO2 (%):  [0.3 %-28 %] 28 % (07/12 0220) Weight:  [50.1 kg (110 lb 7.2 oz)] 50.1 kg (110 lb 7.2 oz) (07/12 0500) Last BM Date: (ostomy)  Intake/Output from previous day: 07/11 0701 - 07/12 0700 In: 4242.9 [I.V.:3212.7; IV Piggyback:1030.2] Out: 1000 [Urine:700; Emesis/NG output:300] Intake/Output this shift: Total I/O In: 2.3 [I.V.:2.3] Out: -   Physical exam: NAD debilitated and malnourished , awake and alert, follows commands Abd: soft, NT , ostomy working and wound healing well, no infection or peritonitis Ext: well perfused and warm  Lab Results: CBC  Recent Labs    04/06/18 1926  04/08/18 0232 04/08/18 0554  WBC 6.9  --   --  5.7  HGB 5.8*   < > 12.5 12.4  HCT 16.8*   < > 36.0 36.0  PLT 335  --   --  156   < > = values in this interval not displayed.   BMET Recent Labs    04/07/18 0644 04/08/18 0554  NA 141 143  K 2.9* 3.5  CL 114* 117*  CO2 23 24  GLUCOSE 65* 97  BUN 18 16  CREATININE 1.00 0.81  CALCIUM 6.5* 6.7*   PT/INR Recent Labs    04/06/18 1925  LABPROT 16.1*  INR 1.30   ABG Recent Labs    04/06/18 1925 04/07/18 0449  PHART 7.47* 7.48*  HCO3 21.1 20.1    Studies/Results: Dg Abd 1 View  Result Date: 04/06/2018 CLINICAL DATA:  Orogastric tube placement. EXAM: ABDOMEN - 1 VIEW COMPARISON:  Radiograph of March 06, 2018. FINDINGS: The bowel gas pattern is normal. No radio-opaque calculi or other significant radiographic abnormality are seen. Distal tip of nasogastric tube is seen in proximal stomach. IMPRESSION: No evidence of bowel obstruction or ileus. Distal tip of  nasogastric tube is seen in proximal stomach. Electronically Signed   By: Lupita Raider, M.D.   On: 04/06/2018 20:32   Dg Chest Port 1 View  Result Date: 04/07/2018 CLINICAL DATA:  Encounter for central line placement EXAM: PORTABLE CHEST 1 VIEW COMPARISON:  Earlier today FINDINGS: New left IJ line with tip at the SVC. No pneumothorax or mediastinal widening. Endotracheal tube tip between the clavicular heads and carina. An orogastric tube tip is at the gastric fundus. The lungs remain clear. Normal heart size IMPRESSION: 1. No adverse finding after left IJ line placement. 2. No evidence of active cardiopulmonary disease. Electronically Signed   By: Marnee Spring M.D.   On: 04/07/2018 00:14   Dg Chest Port 1 View  Result Date: 04/06/2018 CLINICAL DATA:  Endotracheal tube placement. EXAM: PORTABLE CHEST 1 VIEW COMPARISON:  Radiographs of April 05, 2018. FINDINGS: The heart size and mediastinal contours are within normal limits. Endotracheal tube is seen projected over tracheal air shadow with distal tip 2 cm above the carina. Nasogastric tube tip is seen in proximal stomach. There is no evidence of bowel obstruction or ileus. Both lungs are clear. Stable old proximal right humeral fracture is noted. IMPRESSION: Endotracheal and nasogastric tubes are in grossly good position. No acute pulmonary abnormality is noted. Electronically Signed  By: Lupita RaiderJames  Green Jr, M.D.   On: 04/06/2018 20:31   [place] Ekg Site Rite  Result Date: 04/07/2018 If Site Rite image not attached, placement could not be confirmed due to current cardiac rhythm.   Anti-infectives: Anti-infectives (From admission, onward)   Start     Dose/Rate Route Frequency Ordered Stop   04/06/18 0515  metroNIDAZOLE (FLAGYL) IVPB 500 mg  Status:  Discontinued     500 mg 100 mL/hr over 60 Minutes Intravenous Every 8 hours 04/06/18 0504 04/07/18 1022   04/06/18 0100  piperacillin-tazobactam (ZOSYN) IVPB 3.375 g  Status:  Discontinued     3.375  g 12.5 mL/hr over 240 Minutes Intravenous Every 8 hours 04/06/18 0045 04/08/18 0725   04/05/18 1845  piperacillin-tazobactam (ZOSYN) IVPB 3.375 g     3.375 g 100 mL/hr over 30 Minutes Intravenous  Once 04/05/18 1840 04/05/18 1955      Assessment/Plan:  Upper GI bleed resolved  No surgical issues at this time Ok w enteral feeds + TPN Please call for any surgical issues  Sterling Bigiego Pabon, MD, [id number]  04/08/2018

## 2018-04-08 NOTE — Progress Notes (Signed)
Nutrition Follow-up  DOCUMENTATION CODES:   Severe malnutrition in context of acute illness/injury  INTERVENTION:   Once labs stabilized, increase Clinimix E 5/15 to goal rate of 60 mL/hr.  Initiate 20% lipids @15ml /hr x 12 hrs   Provides 1382 kcal/day, 72 g/day of protein, 1620 mL fluid daily.  Continue MVI daily  Increase trace elements to 2ml added to bag (provides 2mg  copper)   Continue folic acid 1 mg and thiamine 100 mg as daily TPN additives.  Monitor magnesium, potassium, and phosphorus daily as pt is at risk for refeeding syndrome given severe malnutrition.  NUTRITION DIAGNOSIS:   Severe Malnutrition related to acute illness as evidenced by 12 percent weight loss in 5 weeks, moderate fat depletion, severe muscle depletion.  Ongoing - addressing with TPN.  GOAL:   Patient will meet greater than or equal to 90% of their needs  Progressing with TPN.  MONITOR:   Diet advancement, Labs, Weight trends, Skin, I & O's, Other (Comment)(TPN)  ASSESSMENT:    67 y.o. female with a history of perforated sigmoid diverticulitis status post status post partial colectomy with colostomy 02/25/2018 presenting for lightheadedness with standing, nausea and vomiting, chest pain, and left upper quadrant pain. Pt found to have colitis and sepsis    -Patient underwent EGD 7/10. Findings included one non-bleeding cratered duodenal ulcer with flat pigmented spot (15 mm in largest dimension), clogged blood and coffee ground material in gastric fundus that was cleared with lavage. -Rapid response was called on evening of 7/10. Patient was vomiting bright red blood and had dark red blood in colostomy. Patient was intubated and transferred to ICU.  Pt extubated today; tolerating well. Phosphorus low this morning; 20mmol given at 0900. Plan is to recheck phosphorus later today, if value in acceptable range will increase TPN to goal rate. Will also plan to initiate lipids as pt is now extubated.  Copper lab returned low normal; ceruloplasmin low. As both copper and ceruloplasmin are positive acute phase reactants, suspect copper levels are low. Will add 2ml of trace elements into TPN bag to provide 2mg  of copper daily. Per chart, pt with ~13lb weight gain since admit; pt noted to be +12L on I & Os. Pt also noted to have mild pitting edema. Continue to monitor labs as pt is at high refeed risk.   Medications reviewed and include: Protonix, KPhos  Labs reviewed: K 3.5 wnl, Ca 6.7(L) adj. 8.7(L), P 2.0(L), Mg 2.1 wnl, alb 1.5(L) Prealbumin- 7.7(L)- 7/10 Folate- 5.1(L)- 7/10 Ceruloplasmin- 14.1(L)- 7/10 Copper 72- 7/10 B12- 574- 7/10 cbgs- 101, 97, 75 x 24 hrs  Diet Order:  NPO Diet Order    None     EDUCATION NEEDS:   Education needs have been addressed  Skin:  Skin Assessment: Reviewed RN Assessment(closed incision to abdomen)  Last BM:  04/06/2018 - 400 mL output in colostomy  Height:   Ht Readings from Last 1 Encounters:  04/06/18 5\' 2"  (1.575 m)    Weight:   Wt Readings from Last 1 Encounters:  04/08/18 110 lb 7.2 oz (50.1 kg)    Ideal Body Weight:  50 kg  BMI:  Body mass index is 20.2 kg/m.  Estimated Nutritional Needs:   Kcal:  1200-1400kcal/day  Protein:  66-75g/day  Fluid:  >1.3L/day   Betsey Holidayasey Shanvi Moyd MS, RD, LDN Pager #- [phone] Office#- [phone] After Hours Pager: [phone]

## 2018-04-09 DIAGNOSIS — K920 Hematemesis: Secondary | ICD-10-CM

## 2018-04-09 DIAGNOSIS — K264 Chronic or unspecified duodenal ulcer with hemorrhage: Secondary | ICD-10-CM

## 2018-04-09 LAB — TYPE AND SCREEN
ABO/RH(D): O POS
ANTIBODY SCREEN: NEGATIVE
UNIT DIVISION: 0
Unit division: 0
Unit division: 0

## 2018-04-09 LAB — BASIC METABOLIC PANEL
ANION GAP: 4 — AB (ref 5–15)
BUN: 12 mg/dL (ref 8–23)
CO2: 25 mmol/L (ref 22–32)
Calcium: 6.5 mg/dL — ABNORMAL LOW (ref 8.9–10.3)
Chloride: 112 mmol/L — ABNORMAL HIGH (ref 98–111)
Creatinine, Ser: 0.61 mg/dL (ref 0.44–1.00)
GFR calc Af Amer: >60 mL/min (ref >60)
Glucose, Bld: 104 mg/dL — ABNORMAL HIGH (ref 70–99)
POTASSIUM: 3.6 mmol/L (ref 3.5–5.1)
SODIUM: 141 mmol/L (ref 135–145)

## 2018-04-09 LAB — CBC
HCT: 34.7 % — ABNORMAL LOW (ref 35.0–47.0)
Hemoglobin: 12.2 g/dL (ref 12.0–16.0)
MCH: 33 pg (ref 26.0–34.0)
MCHC: 35.3 g/dL (ref 32.0–36.0)
MCV: 93.6 fL (ref 80.0–100.0)
PLATELETS: 141 10*3/uL — AB (ref 150–440)
RBC: 3.7 MIL/uL — AB (ref 3.80–5.20)
RDW: 15.6 % — ABNORMAL HIGH (ref 11.5–14.5)
WBC: 6.7 10*3/uL (ref 3.6–11.0)

## 2018-04-09 LAB — BPAM RBC
BLOOD PRODUCT EXPIRATION DATE: 201908062359
BLOOD PRODUCT EXPIRATION DATE: 201908102359
Blood Product Expiration Date: 201908062359
ISSUE DATE / TIME: 201907102040
ISSUE DATE / TIME: 201907102249
ISSUE DATE / TIME: 201907110240
UNIT TYPE AND RH: 5100
UNIT TYPE AND RH: 5100
Unit Type and Rh: 5100

## 2018-04-09 LAB — PHOSPHORUS
PHOSPHORUS: 1.7 mg/dL — AB (ref 2.5–4.6)
PHOSPHORUS: 2.3 mg/dL — AB (ref 2.5–4.6)

## 2018-04-09 LAB — MAGNESIUM: MAGNESIUM: 1.8 mg/dL (ref 1.7–2.4)

## 2018-04-09 LAB — GLUCOSE, CAPILLARY
GLUCOSE-CAPILLARY: 90 mg/dL (ref 70–99)
GLUCOSE-CAPILLARY: 102 mg/dL — AB (ref 70–99)
Glucose-Capillary: 102 mg/dL — ABNORMAL HIGH (ref 70–99)
Glucose-Capillary: 101 mg/dL — ABNORMAL HIGH (ref 70–99)
Glucose-Capillary: 85 mg/dL (ref 70–99)

## 2018-04-09 MED ORDER — FAT EMULSION PLANT BASED 20 % IV EMUL
250.0000 mL | INTRAVENOUS | Status: DC
  Administered 2018-04-09: 250 mL via INTRAVENOUS
  Filled 2018-04-09: qty 250

## 2018-04-09 MED ORDER — TRACE MINERALS CR-CU-MN-SE-ZN 10-1000-500-60 MCG/ML IV SOLN
INTRAVENOUS | Status: DC
  Administered 2018-04-09: 18:00:00 via INTRAVENOUS
  Filled 2018-04-09: qty 1440

## 2018-04-09 MED ORDER — STERILE WATER FOR INJECTION IJ SOLN
INTRAMUSCULAR | Status: AC
  Administered 2018-04-09: 10 mL
  Filled 2018-04-09: qty 10

## 2018-04-09 MED ORDER — FENTANYL CITRATE (PF) 100 MCG/2ML IJ SOLN
25.0000 ug | INTRAMUSCULAR | Status: DC | PRN
  Administered 2018-04-09 – 2018-04-14 (×27): 25 ug via INTRAVENOUS
  Filled 2018-04-09 (×27): qty 2

## 2018-04-09 MED ORDER — POTASSIUM PHOSPHATES 15 MMOLE/5ML IV SOLN
30.0000 mmol | Freq: Once | INTRAVENOUS | Status: DC
  Filled 2018-04-09: qty 10

## 2018-04-09 MED ORDER — SODIUM CHLORIDE 0.9% FLUSH
10.0000 mL | Freq: Two times a day (BID) | INTRAVENOUS | Status: DC
  Administered 2018-04-09 – 2018-04-11 (×4): 10 mL
  Administered 2018-04-11: 20 mL
  Administered 2018-04-12: 10 mL
  Administered 2018-04-12: 20 mL
  Administered 2018-04-13 – 2018-04-14 (×3): 10 mL

## 2018-04-09 MED ORDER — PANTOPRAZOLE SODIUM 40 MG PO TBEC
40.0000 mg | DELAYED_RELEASE_TABLET | Freq: Two times a day (BID) | ORAL | Status: DC
  Administered 2018-04-09 – 2018-04-14 (×10): 40 mg via ORAL
  Filled 2018-04-09 (×10): qty 1

## 2018-04-09 MED ORDER — SODIUM CHLORIDE 0.9% FLUSH
10.0000 mL | INTRAVENOUS | Status: DC | PRN
  Administered 2018-04-13: 10 mL
  Filled 2018-04-09: qty 40

## 2018-04-09 MED ORDER — POTASSIUM PHOSPHATES 15 MMOLE/5ML IV SOLN
30.0000 mmol | INTRAVENOUS | Status: DC
  Administered 2018-04-09: 30 mmol via INTRAVENOUS
  Filled 2018-04-09 (×2): qty 10

## 2018-04-09 NOTE — Progress Notes (Signed)
  subjective Status post embolization 7/11.  No further bleeding. . No acute issues overnight status post extubation.  Pleasant with intact cognition   Vitals:   04/09/18 0000 04/09/18 0400  BP: 133/77 121/72  Pulse: 74 76  Resp: 15 14  Temp:  98.3 F (36.8 C)  SpO2: 98% 97%    Gen:  NAD HEENT: NCAT, sclerae whitel Neck: No LAN, no JVD noted Lungs: full BS, no adventitious sounds Cardiovascular: Regular, no M noted Abdomen: Soft, NT, +BS Ext: no C/C/E Neuro: PERRL, EOMI, motor/sensory grossly intact Skin: No lesions noted   BMP Latest Ref Rng & Units 04/09/2018 04/08/2018 04/08/2018  Glucose 70 - 99 mg/dL 161(W104(H) - 97  BUN 8 - 23 mg/dL 12 - 16  Creatinine 9.600.44 - 1.00 mg/dL 4.540.61 - 0.980.81  BUN/Creat Ratio 12 - 28 - - -  Sodium 135 - 145 mmol/L 141 - 143  Potassium 3.5 - 5.1 mmol/L 3.6 3.8 3.5  Chloride 98 - 111 mmol/L 112(H) - 117(H)  CO2 22 - 32 mmol/L 25 - 24  Calcium 8.9 - 10.3 mg/dL 1.1(B6.5(L) - 6.7(L)   CBC Latest Ref Rng & Units 04/09/2018 04/08/2018 04/08/2018  WBC 3.6 - 11.0 K/uL 6.7 5.7 -  Hemoglobin 12.0 - 16.0 g/dL 14.712.2 82.912.4 56.212.5  Hematocrit 35.0 - 47.0 % 34.7(L) 36.0 36.0  Platelets 150 - 440 K/uL 141(L) 156 -   No new CXR  IMPRESSION: UGI bleed, appears to be resolved after embolization procedure Recent colostomy Protein-calorie malnutrition  PLAN/REC: Okay to transfer out of the ICU Continue supplemental oxygen as needed Monitor hemodynamics Continue TPN DVT px: SCDs Monitor CBC intermittently Transfuse per usual guidelines  TPN ordered PICC line ordered Advance diet and activity as able Regimen with oxycodone and fentanyl   Rishabh Rinkenberger S. [facility] ANP-BC Pulmonary and Critical Care Medicine [facility] Pager [phone] or [phone]  NB: This document was prepared using Dragon voice recognition software and may include unintentional dictation errors.   04/09/2018 6:23 AM

## 2018-04-09 NOTE — Progress Notes (Signed)
Pt has remained alert and oriented with c/o 8-9/10 mid, abdominal pain-per pt it is r/t surgical incision that feels like it is stretching. Wound bed appears very clean- no drainage or blood noted- skin integrity surrounding wound bed is red without maceration or warmth. Wound has been re-dressed-pt has been repositioned-pain medication-Oxy-has been given->see MAR. Pt has remained on RA. Lung sounds clear to auscultation. SpO2 > 90%. NDN.  Pt has remained in NSR, BP/HR WNL.  Pt has tolerated full-liquid diet. Minimal dark-green output in ostomy-not enough to measure. Active R upper/lower bowel sounds.  Foley removed. Pt has voided since removal. K+ & P replaced with re-check this afternoon. PICC to be placed this evening or tomorrow am per IV team nurse call-consent has been obtained and placed in chart. CVC IJ remains in place with orders to d/c after PICC placement. 3 R arm PIV removed d/t pain/leaking. Pt with orders to tx to med-surg. eport given to IanthaJessica, RCharity fundraiser

## 2018-04-09 NOTE — Progress Notes (Signed)
PHARMACY - ADULT TOTAL PARENTERAL NUTRITION CONSULT NOTE   Pharmacy Consult for TPN Indication: severe malnutrition   Patient Measurements: Height: 5\' 2"  (157.5 cm) Weight: 116 lb 10 oz (52.9 kg) IBW/kg (Calculated) : 50.1 TPN AdjBW (KG): 44.8 Body mass index is 21.33 kg/m.  Sodium (mmol/L)  Date Value  04/09/2018 141  05/20/2017 139   Potassium (mmol/L)  Date Value  04/09/2018 3.6   Magnesium (mg/dL)  Date Value  16/10/960407/13/2019 1.8   Phosphorus (mg/dL)  Date Value  54/09/811907/13/2019 1.7 (L)   Calcium (mg/dL)  Date Value  14/78/295607/13/2019 6.5 (L)   Albumin (g/dL)  Date Value  21/30/865707/08/2018 1.5 (L)  05/20/2017 3.9   CBG (last 3)  Recent Labs    04/08/18 2349 04/09/18 0323 04/09/18 0729  GLUCAP 98 102* 90     Pharmacy consulted for TPN management for 67 yo female admitted with upper GI bleed. Patient with recent history significant for partial colectomy and ostomy for perforated sigmoid diverticulitis. Patient with embolization on 7/11.   TPN Access: LIJ, PICC placement pending  TPN start date: 7/11 Nutritional Goals (per RD recommendation on 7/12): KCal: 1200-1400kcal/day Protein: 66-75g/day  Fluid: > 1.3 L/day   Goal TPN rate is 60 mL/hr   Current Nutrition:   Assessment/Plan:  Clinimix E 5/15 at 5960mL/hr 20% lipids at 5515mL/hr x 12 hours.   Potassium phosphate 30mmoL IV x 1.   Add MVI, trace elements, folic acid and thiamine   SSI Q4hr. 1 unit SSI in last 24 hours.  Monitor TPN labs, Daily x 3 days and as indicated   Valentina Guhristy, Allannah Kempen D, PharmD, BCPS Clinical Pharmacist 04/09/2018 7:40 AM

## 2018-04-09 NOTE — Progress Notes (Signed)
[address] at [facility]                                                                                                                                                                                  Patient Demographics   Annette Miller, is a 67 y.o. female, DOB - August 28, 1951, ZOX:[id number]  Admit date - 04/05/2018   Admitting Physician Amelia Jo, MD  Outpatient Primary MD for the patient is Guadalupe Maple, MD   LOS - 3  Subjective: Complains of some epigastric pain eating some lunch   Review of Systems:   CONSTITUTIONAL: No documented fever. No fatigue, weakness. No weight gain, no weight loss.  EYES: No blurry or double vision.  ENT: No tinnitus. No postnasal drip. No redness of the oropharynx.  RESPIRATORY: No cough, no wheeze, no hemoptysis. No dyspnea.  CARDIOVASCULAR: No chest pain. No orthopnea. No palpitations. No syncope.  GASTROINTESTINAL: No nausea, no vomiting or diarrhea.  Positive abdominal pain. No melena or hematochezia.  GENITOURINARY:  No urgency. No frequency. No dysuria. No hematuria. No obstructive symptoms. No discharge. No pain. No significant abnormal bleeding ENDOCRINE: No polyuria or nocturia. No heat or cold intolerance.  HEMATOLOGY: No anemia. No bruising. No bleeding. No purpura. No petechiae INTEGUMENTARY: No rashes. No lesions.  MUSCULOSKELETAL: No arthritis. No swelling. No gout.  NEUROLOGIC: No numbness, tingling, or ataxia. No seizure-type activity.  PSYCHIATRIC: No anxiety. No insomnia. No ADD.     Vitals:   Vitals:   04/09/18 1000 04/09/18 1100 04/09/18 1200 04/09/18 1300  BP: 116/70 126/78 138/77 (!) 143/86  Pulse: 74 70 83 (!) 123  Resp: 15 14 (!) 22 (!) 22  Temp:      TempSrc:      SpO2: 97% 97% 99% (!) 76%  Weight:      Height:        Wt Readings from Last 3 Encounters:  04/09/18 52.9 kg (116 lb 10 oz)  03/24/18 38.8 kg (85 lb 9.6 oz)  03/01/18 50 kg (110 lb 3.7 oz)     Intake/Output Summary  (Last 24 hours) at 04/09/2018 1343 Last data filed at 04/09/2018 1341 Gross per 24 hour  Intake 2654.2 ml  Output 2075 ml  Net 579.2 ml    Physical Exam:   GENERAL: Critically ill sedated HEAD, EYES, EARS, NOSE AND THROAT: Atraumatic, normocephalic. Pupils equal and reactive to light. Sclerae anicteric. No conjunctival injection. No oro-pharyngeal erythema.  NECK: Supple. There is no jugular venous distention. No bruits, no lymphadenopathy, no thyromegaly.  HEART: Regular rate and rhythm,. No murmurs, no rubs, no clicks.  LUNGS: Clear to auscultation bilaterally. No rales or rhonchi. No  wheezes.  ABDOMEN: Soft, flat, nontender, nondistended.  Colostomy bag is in place EXTREMITIES: No evidence of any cyanosis, clubbing, or peripheral edema.  +2 pedal and radial pulses bilaterally.  NEUROLOGIC: Follow commands SKIN: Moist and warm with no rashes appreciated.  Psych: Follow commands LN: No inguinal LN enlargement    Antibiotics   Anti-infectives (From admission, onward)   Start     Dose/Rate Route Frequency Ordered Stop   04/06/18 0515  metroNIDAZOLE (FLAGYL) IVPB 500 mg  Status:  Discontinued     500 mg 100 mL/hr over 60 Minutes Intravenous Every 8 hours 04/06/18 0504 04/07/18 1022   04/06/18 0100  piperacillin-tazobactam (ZOSYN) IVPB 3.375 g  Status:  Discontinued     3.375 g 12.5 mL/hr over 240 Minutes Intravenous Every 8 hours 04/06/18 0045 04/08/18 0725   04/05/18 1845  piperacillin-tazobactam (ZOSYN) IVPB 3.375 g     3.375 g 100 mL/hr over 30 Minutes Intravenous  Once 04/05/18 1840 04/05/18 1955      Medications   Scheduled Meds: . chlorhexidine gluconate (MEDLINE KIT)  15 mL Mouth Rinse BID  . feeding supplement (ENSURE ENLIVE)  237 mL Oral BID BM  . insulin aspart  0-9 Units Subcutaneous Q4H  . pantoprazole  40 mg Oral BID  . sodium chloride flush  10-40 mL Intracatheter Q12H   Continuous Infusions: . Marland KitchenTPN (CLINIMIX-E) Adult 60 mL/hr at 04/08/18 1808  . Marland KitchenTPN  (CLINIMIX-E) Adult    . Fat emulsion    . potassium PHOSPHATE IVPB (in mmol)     PRN Meds:.acetaminophen **OR** acetaminophen, bisacodyl, diphenoxylate-atropine, fentaNYL (SUBLIMAZE) injection, [DISCONTINUED] ondansetron **OR** ondansetron (ZOFRAN) IV, oxyCODONE, sodium chloride flush   Data Review:   Micro Results Recent Results (from the past 240 hour(s))  Blood Culture (routine x 2)     Status: None (Preliminary result)   Collection Time: 04/05/18  7:08 PM  Result Value Ref Range Status   Specimen Description BLOOD BLOOD RIGHT WRIST  Final   Special Requests   Final    BOTTLES DRAWN AEROBIC AND ANAEROBIC Blood Culture adequate volume   Culture   Final    NO GROWTH 4 DAYS Performed at [facility], [address] [place], [place] [address]    Report Status PENDING  Incomplete  Blood Culture (routine x 2)     Status: None (Preliminary result)   Collection Time: 04/05/18  7:09 PM  Result Value Ref Range Status   Specimen Description BLOOD LEFT ANTECUBITAL  Final   Special Requests   Final    BOTTLES DRAWN AEROBIC AND ANAEROBIC Blood Culture results may not be optimal due to an inadequate volume of blood received in culture bottles   Culture   Final    NO GROWTH 4 DAYS Performed at [facility], [address] [place], [place] [address]    Report Status PENDING  Incomplete  MRSA PCR Screening     Status: None   Collection Time: 04/06/18  8:09 PM  Result Value Ref Range Status   MRSA by PCR NEGATIVE NEGATIVE Final    Comment:        The GeneXpert MRSA Assay (FDA approved for NASAL specimens only), is one component of a comprehensive MRSA colonization surveillance program. It is not intended to diagnose MRSA infection nor to guide or monitor treatment for MRSA infections. Performed at [facility], [address], [place], [place] [address]     Radiology Reports Dg Chest 2 View  Result Date: 04/05/2018 CLINICAL DATA:  Chest pain EXAM:  CHEST - 2 VIEW COMPARISON:  March 04, 2018 FINDINGS: Nonacute proximal right humeral fracture. The heart, hila, mediastinum, lungs, and pleura are otherwise normal. IMPRESSION: Nonacute proximal right humeral fracture.  No acute abnormality. Electronically Signed   By: Dorise Bullion III M.D   On: 04/05/2018 18:17   Dg Abd 1 View  Result Date: 04/06/2018 CLINICAL DATA:  Orogastric tube placement. EXAM: ABDOMEN - 1 VIEW COMPARISON:  Radiograph of March 06, 2018. FINDINGS: The bowel gas pattern is normal. No radio-opaque calculi or other significant radiographic abnormality are seen. Distal tip of nasogastric tube is seen in proximal stomach. IMPRESSION: No evidence of bowel obstruction or ileus. Distal tip of nasogastric tube is seen in proximal stomach. Electronically Signed   By: Marijo Conception, M.D.   On: 04/06/2018 20:32   Ct Abdomen Pelvis W Contrast  Result Date: 04/05/2018 CLINICAL DATA:  Left upper quadrant abdominal pain with nausea and vomiting. Recent partial colectomy and colostomy on 02/25/2018 for perforated sigmoid diverticulitis. EXAM: CT ABDOMEN AND PELVIS WITH CONTRAST TECHNIQUE: Multidetector CT imaging of the abdomen and pelvis was performed using the standard protocol following bolus administration of intravenous contrast. CONTRAST:  20m OMNIPAQUE IOHEXOL 300 MG/ML  SOLN COMPARISON:  CT abdomen pelvis dated Feb 25, 2018. FINDINGS: Lower chest: No acute abnormality. Hepatobiliary: Diffuse hepatic steatosis. The gallbladder remains mildly distended. No gallbladder wall thickening or gallstones. Unchanged mild dilatation of the common bile duct, measuring up to 10 mm. Pancreas: Unremarkable. No pancreatic ductal dilatation or surrounding inflammatory changes. Spleen: Normal in size without focal abnormality. Adrenals/Urinary Tract: Adrenal glands are unremarkable. Kidneys are normal, without renal calculi, focal lesion, or hydronephrosis. Bladder is unremarkable. Stomach/Bowel: Postsurgical  changes related to sigmoid colectomy with left lower quadrant colostomy. There is moderate circumferential wall thickening of the proximal descending colon extending to the ostomy. The appendix is surgically absent. Improving but persistent wall thickening along the antrum of the stomach and first portion of the duodenum. Fecalization of distal small bowel contents likely represents delayed transit. No bowel obstruction. Vascular/Lymphatic: Aortic atherosclerosis. No enlarged abdominal or pelvic lymph nodes. Reproductive: Status post hysterectomy. No adnexal masses. Other: 2.0 x 2.5 cm rounded area of inflammatory fat stranding in the right mid abdomen. No free fluid or pneumoperitoneum. Mild presacral inflammatory stranding. Open midline abdominal surgical incision. Musculoskeletal: No acute or significant osseous findings. IMPRESSION: 1. Postsurgical changes related to sigmoid colectomy with left lower quadrant colostomy. Moderate circumferential wall thickening of the proximal descending colon extending to the ostomy, consistent with colitis. 2. Improving but persistent wall thickening along the antrum of the stomach and first portion of the duodenum with adjacent prominent lymph nodes which are slightly increased in size, now measuring up to 8 mm in short axis. Correlation with endoscopy is recommended if not previously performed. 3. Unchanged mild dilatation of the common bile duct and gallbladder distention. Correlate with LFTs and consider further evaluation with ERCP or MRCP as clinically indicated. 4. New 2.0 x 2.5 cm rounded area of inflammatory fat stranding in the right mid abdomen may represent omental infarct. Electronically Signed   By: WTitus DubinM.D.   On: 04/05/2018 21:32   Dg Chest Port 1 View  Result Date: 04/07/2018 CLINICAL DATA:  Encounter for central line placement EXAM: PORTABLE CHEST 1 VIEW COMPARISON:  Earlier today FINDINGS: New left IJ line with tip at the SVC. No pneumothorax  or mediastinal widening. Endotracheal tube tip between the clavicular heads and carina. An  orogastric tube tip is at the gastric fundus. The lungs remain clear. Normal heart size IMPRESSION: 1. No adverse finding after left IJ line placement. 2. No evidence of active cardiopulmonary disease. Electronically Signed   By: Monte Fantasia M.D.   On: 04/07/2018 00:14   Dg Chest Port 1 View  Result Date: 04/06/2018 CLINICAL DATA:  Endotracheal tube placement. EXAM: PORTABLE CHEST 1 VIEW COMPARISON:  Radiographs of April 05, 2018. FINDINGS: The heart size and mediastinal contours are within normal limits. Endotracheal tube is seen projected over tracheal air shadow with distal tip 2 cm above the carina. Nasogastric tube tip is seen in proximal stomach. There is no evidence of bowel obstruction or ileus. Both lungs are clear. Stable old proximal right humeral fracture is noted. IMPRESSION: Endotracheal and nasogastric tubes are in grossly good position. No acute pulmonary abnormality is noted. Electronically Signed   By: Marijo Conception, M.D.   On: 04/06/2018 20:31   [place] Ekg Site Rite  Result Date: 04/07/2018 If Site Rite image not attached, placement could not be confirmed due to current cardiac rhythm.    CBC Recent Labs  Lab 04/05/18 1908 04/06/18 0411 04/06/18 1926  04/07/18 1533 04/07/18 2027 04/08/18 0232 04/08/18 0554 04/09/18 0501  WBC 8.2 8.1 6.9  --   --   --   --  5.7 6.7  HGB 5.9* 7.3* 5.8*   < > 12.5 12.5 12.5 12.4 12.2  HCT 17.6* 21.1* 16.8*   < > 35.6 36.2 36.0 36.0 34.7*  PLT 407 362 335  --   --   --   --  156 141*  MCV 108.6* 102.6* 103.9*  --   --   --   --  93.4 93.6  MCH 36.7* 35.5* 36.1*  --   --   --   --  32.2 33.0  MCHC 33.8 34.6 34.7  --   --   --   --  34.5 35.3  RDW 24.2* 21.7* 22.1*  --   --   --   --  15.8* 15.6*  LYMPHSABS  --   --   --   --   --   --   --  1.3  --   MONOABS  --   --   --   --   --   --   --  0.4  --   EOSABS  --   --   --   --   --   --   --  0.5   --   BASOSABS  --   --   --   --   --   --   --  0.0  --    < > = values in this interval not displayed.    Chemistries  Recent Labs  Lab 04/05/18 1909 04/06/18 0411 04/06/18 1926 04/07/18 0644 04/08/18 0554 04/08/18 1737 04/09/18 0501  NA  --  137 140 141 143  --  141  K  --  3.5 3.6 2.9* 3.5 3.8 3.6  CL  --  105 111 114* 117*  --  112*  CO2  --  '27 23 23 24  '$ --  25  GLUCOSE  --  62* 116* 65* 97  --  104*  BUN  --  '20 22 18 16  '$ --  12  CREATININE  --  1.31* 1.31* 1.00 0.81  --  0.61  CALCIUM  --  8.3* 7.1* 6.5* 6.7*  --  6.5*  MG  --   --   --  1.3* 2.1  --  1.8  AST 32  --  24  --  15  --   --   ALT 24  --  15  --  13  --   --   ALKPHOS 114  --  74  --  63  --   --   BILITOT 0.6  --  0.5  --  0.6  --   --    ------------------------------------------------------------------------------------------------------------------ estimated creatinine clearance is 54.7 mL/min (by C-G formula based on SCr of 0.61 mg/dL). ------------------------------------------------------------------------------------------------------------------ No results for input(s): HGBA1C in the last 72 hours. ------------------------------------------------------------------------------------------------------------------ No results for input(s): CHOL, HDL, LDLCALC, TRIG, CHOLHDL, LDLDIRECT in the last 72 hours. ------------------------------------------------------------------------------------------------------------------ No results for input(s): TSH, T4TOTAL, T3FREE, THYROIDAB in the last 72 hours.  Invalid input(s): FREET3 ------------------------------------------------------------------------------------------------------------------ No results for input(s): VITAMINB12, FOLATE, FERRITIN, TIBC, IRON, RETICCTPCT in the last 72 hours.  Coagulation profile Recent Labs  Lab 04/06/18 1925  INR 1.30    No results for input(s): DDIMER in the last 72 hours.  Cardiac Enzymes Recent Labs  Lab  04/05/18 1908 04/05/18 2211  TROPONINI 0.03* 0.03*   ------------------------------------------------------------------------------------------------------------------ Invalid input(s): POCBNP    Assessment & Plan   1. upper GI bleed with a large duodenal ulcer Status post vascular embolization Hemoglobin stable Check CBC in the morning Continue Protonix drip  2.  Acute respiratory failure continue ventilator support Plan for extubation   3.  Possible.  Acute colitis with antibiotics discontinued by the intensivist On TPN  4.  Acute UTI.    Patient's antibiotics discontinued  5.  Severe anemia, likely multifactorial, related to chronic anemia, worsened by acute blood loss transfuse as needed  6.  Acute renal failure, likely prerenal.    Continue IV fluids follow renal function  7.  Tobacco abuse.  Nicotine patch if patient agreeable        Code Status Orders  (From admission, onward)        Start     Ordered   04/06/18 0229  Full code  Continuous     04/06/18 0229    Code Status History    Date Active Date Inactive Code Status Order ID Comments User Context   02/25/2018 1326 03/12/2018 1730 Full Code [id number]  Hadley Pen, DO Inpatient    Advance Directive Documentation     Most Recent Value  Type of Advance Directive  Healthcare Power of [place], Living will  Pre-existing out of facility DNR order (yellow form or pink MOST form)  -  "MOST" Form in Place?  -           Consults GI ,SURGERY   DVT Prophylaxis  SCD'S  Lab Results  Component Value Date   PLT 141 (L) 04/09/2018     Time Spent in minutes   35MIN  Greater than 50% of time spent in care coordination and counseling patient regarding the condition and plan of care.   Dustin Flock M.D on 04/09/2018 at 1:43 PM  Between 7am to 6pm - Pager - [phone]  After 6pm go to [email or web address] - Proofreader  [employer]   Office  [phone]

## 2018-04-09 NOTE — Progress Notes (Signed)
Peripherally Inserted Central Catheter/Midline Placement  The IV Nurse has discussed with the patient and/or persons authorized to consent for the patient, the purpose of this procedure and the potential benefits and risks involved with this procedure.  The benefits include less needle sticks, lab draws from the catheter, and the patient may be discharged home with the catheter. Risks include, but not limited to, infection, bleeding, blood clot (thrombus formation), and puncture of an artery; nerve damage and irregular heartbeat and possibility to perform a PICC exchange if needed/ordered by physician.  Alternatives to this procedure were also discussed.  Bard Power PICC patient education guide, fact sheet on infection prevention and patient information card has been provided to patient /or left at bedside.    PICC/Midline Placement Documentation  PICC Double Lumen 04/09/18 PICC Right Brachial 33 cm 0 cm (Active)  Indication for Insertion or Continuance of Line Home intravenous therapies (PICC only) 04/09/2018  5:00 PM  Exposed Catheter (cm) 0 cm 04/09/2018  5:00 PM  Site Assessment Clean;Dry;Intact 04/09/2018  5:00 PM  Lumen #1 Status Flushed;Blood return noted;Saline locked 04/09/2018  5:00 PM  Lumen #2 Status Flushed;Blood return noted;Saline locked 04/09/2018  5:00 PM  Dressing Type Transparent 04/09/2018  5:00 PM  Dressing Status Clean;Dry;Intact;Antimicrobial disc in place 04/09/2018  5:00 PM  Line Care Connections checked and tightened 04/09/2018  5:00 PM  Dressing Intervention New dressing 04/09/2018  5:00 PM  Dressing Change Due 04/16/18 04/09/2018  5:00 PM       Edwin Caphomasson III, Shanee Batch Andrew 04/09/2018, 5:54 PM

## 2018-04-09 NOTE — Progress Notes (Signed)
Annette Lame, MD [facility]   [address], [place], [place] [address] Phone: [phone] Fax : [phone]   Subjective: The patient has no complaints today and states that she is doing well.  The patient had a embolization of the gastroduodenal artery by vascular surgery 2 days ago and has been tolerating her diet.  The patient's hemoglobin has been stable.  There has been no further sign of any GI bleeding such as black stools or bloody stools.   Objective: Vital signs in last 24 hours: Vitals:   04/09/18 0600 04/09/18 0700 04/09/18 0800 04/09/18 0900  BP: (!) 77/65 139/80 126/80   Pulse: 77 73 76 81  Resp: '18 18 20 '$ (!) 22  Temp:  98.7 F (37.1 C)    TempSrc:  Oral    SpO2: 97% 97% 99% 99%  Weight:      Height:       Weight change: 6 lb 2.8 oz (2.8 kg)  Intake/Output Summary (Last 24 hours) at 04/09/2018 1146 Last data filed at 04/09/2018 1112 Gross per 24 hour  Intake 1727.95 ml  Output 1650 ml  Net 77.95 ml     Exam: Heart:: Regular rate and rhythm, S1S2 present or without murmur or extra heart sounds Lungs: normal and clear to auscultation and percussion Abdomen: soft, nontender, normal bowel sounds   Lab Results: '@LABTEST2'$ @ Micro Results: Recent Results (from the past 240 hour(s))  Blood Culture (routine x 2)     Status: None (Preliminary result)   Collection Time: 04/05/18  7:08 PM  Result Value Ref Range Status   Specimen Description BLOOD BLOOD RIGHT WRIST  Final   Special Requests   Final    BOTTLES DRAWN AEROBIC AND ANAEROBIC Blood Culture adequate volume   Culture   Final    NO GROWTH 4 DAYS Performed at [facility], [address], [place], [place] [address]    Report Status PENDING  Incomplete  Blood Culture (routine x 2)     Status: None (Preliminary result)   Collection Time: 04/05/18  7:09 PM  Result Value Ref Range Status   Specimen Description BLOOD LEFT ANTECUBITAL  Final   Special Requests   Final    BOTTLES DRAWN  AEROBIC AND ANAEROBIC Blood Culture results may not be optimal due to an inadequate volume of blood received in culture bottles   Culture   Final    NO GROWTH 4 DAYS Performed at [facility], [address], [place], [place] [address]    Report Status PENDING  Incomplete  MRSA PCR Screening     Status: None   Collection Time: 04/06/18  8:09 PM  Result Value Ref Range Status   MRSA by PCR NEGATIVE NEGATIVE Final    Comment:        The GeneXpert MRSA Assay (FDA approved for NASAL specimens only), is one component of a comprehensive MRSA colonization surveillance program. It is not intended to diagnose MRSA infection nor to guide or monitor treatment for MRSA infections. Performed at [facility], [address], [place], [place] [address]    Studies/Results: No results found. Medications: I have reviewed the patient's current medications. Scheduled Meds: . chlorhexidine gluconate (MEDLINE KIT)  15 mL Mouth Rinse BID  . feeding supplement (ENSURE ENLIVE)  237 mL Oral BID BM  . insulin aspart  0-9 Units Subcutaneous Q4H  . pantoprazole  40 mg Intravenous Q12H  . sodium chloride flush  10-40 mL Intracatheter Q12H   Continuous Infusions: . Marland Kitchen  TPN (CLINIMIX-E) Adult 60 mL/hr at 04/08/18 1808  . Marland KitchenTPN (CLINIMIX-E) Adult    . Fat emulsion    . potassium PHOSPHATE IVPB (in mmol) 30 mmol (04/09/18 0706)  . potassium PHOSPHATE IVPB (in mmol)     PRN Meds:.acetaminophen **OR** acetaminophen, bisacodyl, diphenoxylate-atropine, fentaNYL (SUBLIMAZE) injection, [DISCONTINUED] ondansetron **OR** ondansetron (ZOFRAN) IV, oxyCODONE, sodium chloride flush   Assessment: Active Problems:   Symptomatic anemia   Hematemesis    Plan: This patient has been doing well and is being moved out of the ICU to the floor.  Nothing further to do from a GI point of view.  The patient is stable at this point.  I will sign off.  Please call if any further GI concerns or questions.  We  would like to thank you for the opportunity to participate in the care of Annette Miller.    LOS: 3 days   Annette Miller 04/09/2018, 11:46 AM

## 2018-04-09 NOTE — Progress Notes (Signed)
Spoke with Nehemiah SettleBrooke RN re PICC placement by VAS Team  this pm or 04/10/18 am.  Verbalizes understanding.

## 2018-04-10 ENCOUNTER — Encounter: Payer: Self-pay | Admitting: Gastroenterology

## 2018-04-10 LAB — CBC WITH DIFFERENTIAL/PLATELET
BASOS ABS: 0.1 10*3/uL (ref 0–0.1)
BASOS PCT: 1 %
EOS ABS: 0.6 10*3/uL (ref 0–0.7)
Eosinophils Relative: 10 %
HEMATOCRIT: 35.3 % (ref 35.0–47.0)
Hemoglobin: 12.6 g/dL (ref 12.0–16.0)
Lymphocytes Relative: 19 %
Lymphs Abs: 1.2 10*3/uL (ref 1.0–3.6)
MCH: 33.4 pg (ref 26.0–34.0)
MCHC: 35.6 g/dL (ref 32.0–36.0)
MCV: 93.9 fL (ref 80.0–100.0)
Monocytes Absolute: 0.5 10*3/uL (ref 0.2–0.9)
Monocytes Relative: 8 %
NEUTROS ABS: 4.1 10*3/uL (ref 1.4–6.5)
NEUTROS PCT: 62 %
Platelets: 154 10*3/uL (ref 150–440)
RBC: 3.76 MIL/uL — ABNORMAL LOW (ref 3.80–5.20)
RDW: 16.1 % — AB (ref 11.5–14.5)
WBC: 6.5 10*3/uL (ref 3.6–11.0)

## 2018-04-10 LAB — POTASSIUM: POTASSIUM: 4.4 mmol/L (ref 3.5–5.1)

## 2018-04-10 LAB — CULTURE, BLOOD (ROUTINE X 2)
CULTURE: NO GROWTH
Culture: NO GROWTH
SPECIAL REQUESTS: ADEQUATE

## 2018-04-10 LAB — BASIC METABOLIC PANEL
Anion gap: 2 — ABNORMAL LOW (ref 5–15)
BUN: 10 mg/dL (ref 8–23)
CHLORIDE: 107 mmol/L (ref 98–111)
CO2: 27 mmol/L (ref 22–32)
Calcium: 6.4 mg/dL — CL (ref 8.9–10.3)
Creatinine, Ser: 0.53 mg/dL (ref 0.44–1.00)
GFR calc Af Amer: >60 mL/min (ref >60)
GFR calc non Af Amer: >60 mL/min (ref >60)
GLUCOSE: 102 mg/dL — AB (ref 70–99)
POTASSIUM: 3.8 mmol/L (ref 3.5–5.1)
Sodium: 136 mmol/L (ref 135–145)

## 2018-04-10 LAB — GLUCOSE, CAPILLARY
GLUCOSE-CAPILLARY: 107 mg/dL — AB (ref 70–99)
GLUCOSE-CAPILLARY: 112 mg/dL — AB (ref 70–99)
GLUCOSE-CAPILLARY: 120 mg/dL — AB (ref 70–99)
GLUCOSE-CAPILLARY: 120 mg/dL — AB (ref 70–99)
GLUCOSE-CAPILLARY: 99 mg/dL (ref 70–99)
Glucose-Capillary: 94 mg/dL (ref 70–99)

## 2018-04-10 LAB — PHOSPHORUS
PHOSPHORUS: 3.5 mg/dL (ref 2.5–4.6)
Phosphorus: 1.7 mg/dL — ABNORMAL LOW (ref 2.5–4.6)

## 2018-04-10 LAB — MAGNESIUM
MAGNESIUM: 1.9 mg/dL (ref 1.7–2.4)
Magnesium: 1.5 mg/dL — ABNORMAL LOW (ref 1.7–2.4)

## 2018-04-10 MED ORDER — HYDRALAZINE HCL 20 MG/ML IJ SOLN
10.0000 mg | Freq: Four times a day (QID) | INTRAMUSCULAR | Status: DC | PRN
  Filled 2018-04-10: qty 1

## 2018-04-10 MED ORDER — MAGNESIUM SULFATE 4 GM/100ML IV SOLN
4.0000 g | Freq: Once | INTRAVENOUS | Status: AC
  Administered 2018-04-10: 4 g via INTRAVENOUS
  Filled 2018-04-10: qty 100

## 2018-04-10 MED ORDER — FAT EMULSION PLANT BASED 20 % IV EMUL
250.0000 mL | INTRAVENOUS | Status: DC
  Filled 2018-04-10: qty 250

## 2018-04-10 MED ORDER — POTASSIUM PHOSPHATES 15 MMOLE/5ML IV SOLN
45.0000 mmol | Freq: Once | INTRAVENOUS | Status: AC
  Administered 2018-04-10: 45 mmol via INTRAVENOUS
  Filled 2018-04-10: qty 15

## 2018-04-10 MED ORDER — TRACE MINERALS CR-CU-MN-SE-ZN 10-1000-500-60 MCG/ML IV SOLN
INTRAVENOUS | Status: DC
  Filled 2018-04-10: qty 1440

## 2018-04-10 NOTE — Progress Notes (Signed)
Dr Allena KatzPatel gave the order to discontinue TPN today.  I spoke with dietician. TPN will be decreased to 8930ml/hr then discontinued at 1800

## 2018-04-10 NOTE — Progress Notes (Signed)
PHARMACY - ADULT TOTAL PARENTERAL NUTRITION CONSULT NOTE   Pharmacy Consult for TPN Indication: severe malnutrition   Patient Measurements: Height: 5\' 2"  (157.5 cm) Weight: 116 lb (52.6 kg)(Unable to weight, Pt unable to stand and bed not zeroed out.) IBW/kg (Calculated) : 50.1 TPN AdjBW (KG): 44.8 Body mass index is 21.22 kg/m.  Sodium (mmol/L)  Date Value  04/10/2018 136  05/20/2017 139   Potassium (mmol/L)  Date Value  04/10/2018 3.8   Magnesium (mg/dL)  Date Value  16/10/960407/14/2019 1.5 (L)   Phosphorus (mg/dL)  Date Value  54/09/811907/14/2019 1.7 (L)   Calcium (mg/dL)  Date Value  14/78/295607/14/2019 6.4 (LL)   Albumin (g/dL)  Date Value  21/30/865707/08/2018 1.5 (L)  05/20/2017 3.9   CBG (last 3)  Recent Labs    04/10/18 0000 04/10/18 0356 04/10/18 0739  GLUCAP 120* 112* 99     Pharmacy consulted for TPN management for 67 yo female admitted with upper GI bleed. Patient with recent history significant for partial colectomy and ostomy for perforated sigmoid diverticulitis. Patient with embolization on 7/11.   TPN Access: LIJ, PICC placement pending  TPN start date: 7/11 Nutritional Goals (per RD recommendation on 7/12): KCal: 1200-1400kcal/day Protein: 66-75g/day  Fluid: > 1.3 L/day   Goal TPN rate is 60 mL/hr   Current Nutrition:   Assessment/Plan:  Clinimix E 5/15 at 8760mL/hr 20% lipids at 3215mL/hr x 12 hours.   Potassium phosphate 45 mmoL IV x 1 and magnesium sulfate 4 g iv once. Will recheck electrolytes this PM.   Add MVI, trace elements, folic acid and thiamine   SSI Q4hr. 0 unit SSI in last 24 hours.  Monitor TPN labs, Daily x 3 days and as indicated   Valentina Guhristy, Geoffrey Mankin D, PharmD, BCPS Clinical Pharmacist 04/10/2018 8:46 AM

## 2018-04-10 NOTE — Progress Notes (Signed)
Patient requesting crackers.  Order received from Dr Allena KatzPatel that patient can have crackers

## 2018-04-10 NOTE — Progress Notes (Signed)
PHARMACY - ADULT TOTAL PARENTERAL NUTRITION CONSULT NOTE   Pharmacy Consult for TPN Indication: severe malnutrition   Patient Measurements: Height: 5\' 2"  (157.5 cm) Weight: 116 lb (52.6 kg)(Unable to weight, Pt unable to stand and bed not zeroed out.) IBW/kg (Calculated) : 50.1 TPN AdjBW (KG): 44.8 Body mass index is 21.22 kg/m.  Sodium (mmol/L)  Date Value  04/10/2018 136  05/20/2017 139   Potassium (mmol/L)  Date Value  04/10/2018 4.4   Magnesium (mg/dL)  Date Value  40/98/119107/14/2019 1.9   Phosphorus (mg/dL)  Date Value  47/82/956207/14/2019 3.5   Calcium (mg/dL)  Date Value  13/08/657807/14/2019 6.4 (LL)   Albumin (g/dL)  Date Value  46/96/295207/08/2018 1.5 (L)  05/20/2017 3.9   CBG (last 3)  Recent Labs    04/10/18 0739 04/10/18 1200 04/10/18 1618  GLUCAP 99 107* 120*     Pharmacy consulted for TPN management for 67 yo female admitted with upper GI bleed. Patient with recent history significant for partial colectomy and ostomy for perforated sigmoid diverticulitis. Patient with embolization on 7/11.   TPN Access: LIJ, PICC placement pending  TPN start date: 7/11 Nutritional Goals (per RD recommendation on 7/12): KCal: 1200-1400kcal/day Protein: 66-75g/day  Fluid: > 1.3 L/day   Goal TPN rate is 60 mL/hr   Current Nutrition:   Assessment/Plan:  Clinimix E 5/15 at 1360mL/hr 20% lipids at 1615mL/hr x 12 hours.   Potassium phosphate 45 mmoL IV x 1 and magnesium sulfate 4 g iv once. Will recheck electrolytes this PM.   Add MVI, trace elements, folic acid and thiamine   SSI Q4hr. 0 unit SSI in last 24 hours.  Monitor TPN labs, Daily x 3 days and as indicated   7/14 1800: K: 4.4, Mg: 1.9, Phos: 3.5- No additional replacement needed at this time. Will recheck electrolytes with AM labs and continue to replace as needed.   Gardner CandleSheema M Roman Dubuc, PharmD, BCPS Clinical Pharmacist 04/10/2018 6:24 PM

## 2018-04-10 NOTE — Progress Notes (Signed)
[employer] - [place] at [facility]                                                                                                                                                                                  Patient Demographics   Annette Miller, is a 67 y.o. female, DOB - 06-04-1951, ZOX:[id number]  Admit date - 04/05/2018   Admitting Physician Cammy Copa, MD  Outpatient Primary MD for the patient is Steele Sizer, MD   LOS - 4  Subjective: Patient feeling better no throwing up blood blood in stool   Review of Systems:   CONSTITUTIONAL: No documented fever. No fatigue, weakness. No weight gain, no weight loss.  EYES: No blurry or double vision.  ENT: No tinnitus. No postnasal drip. No redness of the oropharynx.  RESPIRATORY: No cough, no wheeze, no hemoptysis. No dyspnea.  CARDIOVASCULAR: No chest pain. No orthopnea. No palpitations. No syncope.  GASTROINTESTINAL: No nausea, no vomiting or diarrhea.  Positive abdominal pain. No melena or hematochezia.  GENITOURINARY:  No urgency. No frequency. No dysuria. No hematuria. No obstructive symptoms. No discharge. No pain. No significant abnormal bleeding ENDOCRINE: No polyuria or nocturia. No heat or cold intolerance.  HEMATOLOGY: No anemia. No bruising. No bleeding. No purpura. No petechiae INTEGUMENTARY: No rashes. No lesions.  MUSCULOSKELETAL: No arthritis. No swelling. No gout.  NEUROLOGIC: No numbness, tingling, or ataxia. No seizure-type activity.  PSYCHIATRIC: No anxiety. No insomnia. No ADD.     Vitals:   Vitals:   04/10/18 0358 04/10/18 0500 04/10/18 1304 04/10/18 1318  BP: (!) 152/84  (!) 156/96 (!) 154/94  Pulse: 66  79 78  Resp: 16  16   Temp: 98.3 F (36.8 C)  98.5 F (36.9 C)   TempSrc: Oral  Oral   SpO2: 98%  96%   Weight:  52.6 kg (116 lb)    Height:        Wt Readings from Last 3 Encounters:  04/10/18 52.6 kg (116 lb)  03/24/18 38.8 kg (85 lb 9.6 oz)  03/01/18 50 kg (110 lb 3.7  oz)     Intake/Output Summary (Last 24 hours) at 04/10/2018 1339 Last data filed at 04/10/2018 1316 Gross per 24 hour  Intake 2563.5 ml  Output 4000 ml  Net -1436.5 ml    Physical Exam:   GENERAL: Critically ill sedated HEAD, EYES, EARS, NOSE AND THROAT: Atraumatic, normocephalic. Pupils equal and reactive to light. Sclerae anicteric. No conjunctival injection. No oro-pharyngeal erythema.  NECK: Supple. There is no jugular venous distention. No bruits, no lymphadenopathy, no thyromegaly.  HEART: Regular rate and rhythm,. No murmurs, no rubs, no clicks.  LUNGS: Clear to  auscultation bilaterally. No rales or rhonchi. No wheezes.  ABDOMEN: Soft, flat, nontender, nondistended.  Colostomy bag is in place EXTREMITIES: No evidence of any cyanosis, clubbing, or peripheral edema.  +2 pedal and radial pulses bilaterally.  NEUROLOGIC: Follow commands SKIN: Moist and warm with no rashes appreciated.  Psych: Follow commands LN: No inguinal LN enlargement    Antibiotics   Anti-infectives (From admission, onward)   Start     Dose/Rate Route Frequency Ordered Stop   04/06/18 0515  metroNIDAZOLE (FLAGYL) IVPB 500 mg  Status:  Discontinued     500 mg 100 mL/hr over 60 Minutes Intravenous Every 8 hours 04/06/18 0504 04/07/18 1022   04/06/18 0100  piperacillin-tazobactam (ZOSYN) IVPB 3.375 g  Status:  Discontinued     3.375 g 12.5 mL/hr over 240 Minutes Intravenous Every 8 hours 04/06/18 0045 04/08/18 0725   04/05/18 1845  piperacillin-tazobactam (ZOSYN) IVPB 3.375 g     3.375 g 100 mL/hr over 30 Minutes Intravenous  Once 04/05/18 1840 04/05/18 1955      Medications   Scheduled Meds: . feeding supplement (ENSURE ENLIVE)  237 mL Oral BID BM  . insulin aspart  0-9 Units Subcutaneous Q4H  . pantoprazole  40 mg Oral BID  . sodium chloride flush  10-40 mL Intracatheter Q12H  . sodium chloride flush  10-40 mL Intracatheter Q12H   Continuous Infusions: . Marland KitchenTPN (CLINIMIX-E) Adult 30 mL/hr at  04/10/18 1133  . potassium PHOSPHATE IVPB (in mmol)    . potassium PHOSPHATE IVPB (in mmol) 45 mmol (04/10/18 1036)   PRN Meds:.acetaminophen **OR** acetaminophen, bisacodyl, diphenoxylate-atropine, fentaNYL (SUBLIMAZE) injection, hydrALAZINE, [DISCONTINUED] ondansetron **OR** ondansetron (ZOFRAN) IV, oxyCODONE, sodium chloride flush, sodium chloride flush   Data Review:   Micro Results Recent Results (from the past 240 hour(s))  Blood Culture (routine x 2)     Status: None   Collection Time: 04/05/18  7:08 PM  Result Value Ref Range Status   Specimen Description BLOOD BLOOD RIGHT WRIST  Final   Special Requests   Final    BOTTLES DRAWN AEROBIC AND ANAEROBIC Blood Culture adequate volume   Culture   Final    NO GROWTH 5 DAYS Performed at [facility], [address] [place], [place] [address]    Report Status 04/10/2018 FINAL  Final  Blood Culture (routine x 2)     Status: None   Collection Time: 04/05/18  7:09 PM  Result Value Ref Range Status   Specimen Description BLOOD LEFT ANTECUBITAL  Final   Special Requests   Final    BOTTLES DRAWN AEROBIC AND ANAEROBIC Blood Culture results may not be optimal due to an inadequate volume of blood received in culture bottles   Culture   Final    NO GROWTH 5 DAYS Performed at [facility], [address] [place], [place] [address]    Report Status 04/10/2018 FINAL  Final  MRSA PCR Screening     Status: None   Collection Time: 04/06/18  8:09 PM  Result Value Ref Range Status   MRSA by PCR NEGATIVE NEGATIVE Final    Comment:        The GeneXpert MRSA Assay (FDA approved for NASAL specimens only), is one component of a comprehensive MRSA colonization surveillance program. It is not intended to diagnose MRSA infection nor to guide or monitor treatment for MRSA infections. Performed at [facility], [address], [place], [place] [address]     Radiology Reports Dg Chest 2 View  Result Date:  04/05/2018 CLINICAL DATA:  Chest pain EXAM: CHEST - 2 VIEW COMPARISON:  March 04, 2018 FINDINGS: Nonacute proximal right humeral fracture. The heart, hila, mediastinum, lungs, and pleura are otherwise normal. IMPRESSION: Nonacute proximal right humeral fracture.  No acute abnormality. Electronically Signed   By: Gerome Sam III M.D   On: 04/05/2018 18:17   Dg Abd 1 View  Result Date: 04/06/2018 CLINICAL DATA:  Orogastric tube placement. EXAM: ABDOMEN - 1 VIEW COMPARISON:  Radiograph of March 06, 2018. FINDINGS: The bowel gas pattern is normal. No radio-opaque calculi or other significant radiographic abnormality are seen. Distal tip of nasogastric tube is seen in proximal stomach. IMPRESSION: No evidence of bowel obstruction or ileus. Distal tip of nasogastric tube is seen in proximal stomach. Electronically Signed   By: Lupita Raider, M.D.   On: 04/06/2018 20:32   Ct Abdomen Pelvis W Contrast  Result Date: 04/05/2018 CLINICAL DATA:  Left upper quadrant abdominal pain with nausea and vomiting. Recent partial colectomy and colostomy on 02/25/2018 for perforated sigmoid diverticulitis. EXAM: CT ABDOMEN AND PELVIS WITH CONTRAST TECHNIQUE: Multidetector CT imaging of the abdomen and pelvis was performed using the standard protocol following bolus administration of intravenous contrast. CONTRAST:  75mL OMNIPAQUE IOHEXOL 300 MG/ML  SOLN COMPARISON:  CT abdomen pelvis dated Feb 25, 2018. FINDINGS: Lower chest: No acute abnormality. Hepatobiliary: Diffuse hepatic steatosis. The gallbladder remains mildly distended. No gallbladder wall thickening or gallstones. Unchanged mild dilatation of the common bile duct, measuring up to 10 mm. Pancreas: Unremarkable. No pancreatic ductal dilatation or surrounding inflammatory changes. Spleen: Normal in size without focal abnormality. Adrenals/Urinary Tract: Adrenal glands are unremarkable. Kidneys are normal, without renal calculi, focal lesion, or hydronephrosis. Bladder is  unremarkable. Stomach/Bowel: Postsurgical changes related to sigmoid colectomy with left lower quadrant colostomy. There is moderate circumferential wall thickening of the proximal descending colon extending to the ostomy. The appendix is surgically absent. Improving but persistent wall thickening along the antrum of the stomach and first portion of the duodenum. Fecalization of distal small bowel contents likely represents delayed transit. No bowel obstruction. Vascular/Lymphatic: Aortic atherosclerosis. No enlarged abdominal or pelvic lymph nodes. Reproductive: Status post hysterectomy. No adnexal masses. Other: 2.0 x 2.5 cm rounded area of inflammatory fat stranding in the right mid abdomen. No free fluid or pneumoperitoneum. Mild presacral inflammatory stranding. Open midline abdominal surgical incision. Musculoskeletal: No acute or significant osseous findings. IMPRESSION: 1. Postsurgical changes related to sigmoid colectomy with left lower quadrant colostomy. Moderate circumferential wall thickening of the proximal descending colon extending to the ostomy, consistent with colitis. 2. Improving but persistent wall thickening along the antrum of the stomach and first portion of the duodenum with adjacent prominent lymph nodes which are slightly increased in size, now measuring up to 8 mm in short axis. Correlation with endoscopy is recommended if not previously performed. 3. Unchanged mild dilatation of the common bile duct and gallbladder distention. Correlate with LFTs and consider further evaluation with ERCP or MRCP as clinically indicated. 4. New 2.0 x 2.5 cm rounded area of inflammatory fat stranding in the right mid abdomen may represent omental infarct. Electronically Signed   By: Obie Dredge M.D.   On: 04/05/2018 21:32   Dg Chest Port 1 View  Result Date: 04/07/2018 CLINICAL DATA:  Encounter for central line placement EXAM: PORTABLE CHEST 1 VIEW COMPARISON:  Earlier today FINDINGS: New left IJ  line with tip at the SVC. No pneumothorax or mediastinal widening. Endotracheal tube tip between  the clavicular heads and carina. An orogastric tube tip is at the gastric fundus. The lungs remain clear. Normal heart size IMPRESSION: 1. No adverse finding after left IJ line placement. 2. No evidence of active cardiopulmonary disease. Electronically Signed   By: Marnee Spring M.D.   On: 04/07/2018 00:14   Dg Chest Port 1 View  Result Date: 04/06/2018 CLINICAL DATA:  Endotracheal tube placement. EXAM: PORTABLE CHEST 1 VIEW COMPARISON:  Radiographs of April 05, 2018. FINDINGS: The heart size and mediastinal contours are within normal limits. Endotracheal tube is seen projected over tracheal air shadow with distal tip 2 cm above the carina. Nasogastric tube tip is seen in proximal stomach. There is no evidence of bowel obstruction or ileus. Both lungs are clear. Stable old proximal right humeral fracture is noted. IMPRESSION: Endotracheal and nasogastric tubes are in grossly good position. No acute pulmonary abnormality is noted. Electronically Signed   By: Lupita Raider, M.D.   On: 04/06/2018 20:31   [place] Ekg Site Rite  Result Date: 04/07/2018 If Site Rite image not attached, placement could not be confirmed due to current cardiac rhythm.    CBC Recent Labs  Lab 04/05/18 1908 04/06/18 0411 04/06/18 1926  04/07/18 1533 04/07/18 2027 04/08/18 0232 04/08/18 0554 04/09/18 0501  WBC 8.2 8.1 6.9  --   --   --   --  5.7 6.7  HGB 5.9* 7.3* 5.8*   < > 12.5 12.5 12.5 12.4 12.2  HCT 17.6* 21.1* 16.8*   < > 35.6 36.2 36.0 36.0 34.7*  PLT 407 362 335  --   --   --   --  156 141*  MCV 108.6* 102.6* 103.9*  --   --   --   --  93.4 93.6  MCH 36.7* 35.5* 36.1*  --   --   --   --  32.2 33.0  MCHC 33.8 34.6 34.7  --   --   --   --  34.5 35.3  RDW 24.2* 21.7* 22.1*  --   --   --   --  15.8* 15.6*  LYMPHSABS  --   --   --   --   --   --   --  1.3  --   MONOABS  --   --   --   --   --   --   --  0.4  --    EOSABS  --   --   --   --   --   --   --  0.5  --   BASOSABS  --   --   --   --   --   --   --  0.0  --    < > = values in this interval not displayed.    Chemistries  Recent Labs  Lab 04/05/18 1909  04/06/18 1926 04/07/18 0644 04/08/18 0554 04/08/18 1737 04/09/18 0501 04/10/18 0544  NA  --    < > 140 141 143  --  141 136  K  --    < > 3.6 2.9* 3.5 3.8 3.6 3.8  CL  --    < > 111 114* 117*  --  112* 107  CO2  --    < > 23 23 24   --  25 27  GLUCOSE  --    < > 116* 65* 97  --  104* 102*  BUN  --    < > 22 18  16  --  12 10  CREATININE  --    < > 1.31* 1.00 0.81  --  0.61 0.53  CALCIUM  --    < > 7.1* 6.5* 6.7*  --  6.5* 6.4*  MG  --   --   --  1.3* 2.1  --  1.8 1.5*  AST 32  --  24  --  15  --   --   --   ALT 24  --  15  --  13  --   --   --   ALKPHOS 114  --  74  --  63  --   --   --   BILITOT 0.6  --  0.5  --  0.6  --   --   --    < > = values in this interval not displayed.   ------------------------------------------------------------------------------------------------------------------ estimated creatinine clearance is 54 mL/min (by C-G formula based on SCr of 0.53 mg/dL). ------------------------------------------------------------------------------------------------------------------ No results for input(s): HGBA1C in the last 72 hours. ------------------------------------------------------------------------------------------------------------------ No results for input(s): CHOL, HDL, LDLCALC, TRIG, CHOLHDL, LDLDIRECT in the last 72 hours. ------------------------------------------------------------------------------------------------------------------ No results for input(s): TSH, T4TOTAL, T3FREE, THYROIDAB in the last 72 hours.  Invalid input(s): FREET3 ------------------------------------------------------------------------------------------------------------------ No results for input(s): VITAMINB12, FOLATE, FERRITIN, TIBC, IRON, RETICCTPCT in the last 72  hours.  Coagulation profile Recent Labs  Lab 04/06/18 1925  INR 1.30    No results for input(s): DDIMER in the last 72 hours.  Cardiac Enzymes Recent Labs  Lab 04/05/18 1908 04/05/18 2211  TROPONINI 0.03* 0.03*   ------------------------------------------------------------------------------------------------------------------ Invalid input(s): POCBNP    Assessment & Plan   1. upper GI bleed with a large duodenal ulcer Status post vascular embolization Hemoglobin stable Hemoglobin stable Change to Protonix IV twice daily  2.  Acute respiratory failure continue ventilator support That is post extubation   3.  Possible.  Acute colitis with antibiotics discontinued by the intensivist On TPN  4.  Acute UTI.    Patient's antibiotics discontinued  5.  Severe anemia, likely multifactorial, related to chronic anemia, worsened by acute blood loss transfuse as needed  6.  Acute renal failure, likely prerenal.    Continue IV fluids follow renal function  7.  Tobacco abuse.  Nicotine patch   8.  Elevated blood pressure use IV hydralazine PRN  9.  Electrolyte imbalances replace electrolyte pharmacy      Code Status Orders  (From admission, onward)        Start     Ordered   04/06/18 0229  Full code  Continuous     04/06/18 0229    Code Status History    Date Active Date Inactive Code Status Order ID Comments User Context   02/25/2018 1326 03/12/2018 1730 Full Code [id number]  Star Age, DO Inpatient    Advance Directive Documentation     Most Recent Value  Type of Advance Directive  Healthcare Power of Attorney, Living will  Pre-existing out of facility DNR order (yellow form or pink MOST form)  -  "MOST" Form in Place?  -           Consults GI ,SURGERY   DVT Prophylaxis  SCD'S  Lab Results  Component Value Date   PLT 141 (L) 04/09/2018     Time Spent in minutes    Greater than 50% of time spent in care coordination and counseling  patient regarding the condition and plan of care.   Beatrice Sehgal Carolanne Grumbling.D  on 04/10/2018 at 1:39 PM  Between 7am to 6pm - Pager - [phone]  After 6pm go to [email or web address] - Social research officer, governmentpassword [email or web address]  [employer]   Office  [phone]

## 2018-04-11 LAB — COMPREHENSIVE METABOLIC PANEL
ALT: 13 U/L (ref 0–44)
AST: 21 U/L (ref 15–41)
Albumin: 1.7 g/dL — ABNORMAL LOW (ref 3.5–5.0)
Alkaline Phosphatase: 79 U/L (ref 38–126)
Anion gap: 5 (ref 5–15)
BILIRUBIN TOTAL: 0.5 mg/dL (ref 0.3–1.2)
BUN: 7 mg/dL — AB (ref 8–23)
CHLORIDE: 106 mmol/L (ref 98–111)
CO2: 25 mmol/L (ref 22–32)
Calcium: 6.7 mg/dL — ABNORMAL LOW (ref 8.9–10.3)
Creatinine, Ser: 0.42 mg/dL — ABNORMAL LOW (ref 0.44–1.00)
GFR calc Af Amer: >60 mL/min (ref >60)
GLUCOSE: 112 mg/dL — AB (ref 70–99)
POTASSIUM: 3.8 mmol/L (ref 3.5–5.1)
Sodium: 136 mmol/L (ref 135–145)
TOTAL PROTEIN: 4.8 g/dL — AB (ref 6.5–8.1)

## 2018-04-11 LAB — DIFFERENTIAL
Basophils Absolute: 0 10*3/uL (ref 0–0.1)
Basophils Relative: 1 %
EOS ABS: 0.5 10*3/uL (ref 0–0.7)
EOS PCT: 9 %
LYMPHS PCT: 18 %
Lymphs Abs: 1 10*3/uL (ref 1.0–3.6)
MONO ABS: 0.6 10*3/uL (ref 0.2–0.9)
Monocytes Relative: 11 %
Neutro Abs: 3.6 10*3/uL (ref 1.4–6.5)
Neutrophils Relative %: 61 %

## 2018-04-11 LAB — GLUCOSE, CAPILLARY
GLUCOSE-CAPILLARY: 98 mg/dL (ref 70–99)
Glucose-Capillary: 79 mg/dL (ref 70–99)
Glucose-Capillary: 88 mg/dL (ref 70–99)
Glucose-Capillary: 77 mg/dL (ref 70–99)

## 2018-04-11 LAB — CBC
HCT: 37.4 % (ref 35.0–47.0)
HEMOGLOBIN: 13.2 g/dL (ref 12.0–16.0)
MCH: 33.2 pg (ref 26.0–34.0)
MCHC: 35.3 g/dL (ref 32.0–36.0)
MCV: 94.1 fL (ref 80.0–100.0)
Platelets: 181 10*3/uL (ref 150–440)
RBC: 3.98 MIL/uL (ref 3.80–5.20)
RDW: 15.6 % — ABNORMAL HIGH (ref 11.5–14.5)
WBC: 5.8 10*3/uL (ref 3.6–11.0)

## 2018-04-11 LAB — MAGNESIUM: Magnesium: 1.8 mg/dL (ref 1.7–2.4)

## 2018-04-11 LAB — PHOSPHORUS: Phosphorus: 2.3 mg/dL — ABNORMAL LOW (ref 2.5–4.6)

## 2018-04-11 LAB — TRIGLYCERIDES: Triglycerides: 144 mg/dL (ref <150)

## 2018-04-11 MED ORDER — ADULT MULTIVITAMIN W/MINERALS CH
1.0000 | ORAL_TABLET | Freq: Every day | ORAL | Status: DC
  Administered 2018-04-12 – 2018-04-14 (×3): 1 via ORAL
  Filled 2018-04-11 (×3): qty 1

## 2018-04-11 MED ORDER — INSULIN ASPART 100 UNIT/ML ~~LOC~~ SOLN: 0.0000 [IU] | Freq: Three times a day (TID) | SUBCUTANEOUS | Status: DC

## 2018-04-11 MED ORDER — OCUVITE-LUTEIN PO CAPS
1.0000 | ORAL_CAPSULE | Freq: Every day | ORAL | Status: DC
  Administered 2018-04-12 – 2018-04-13 (×2): 1 via ORAL
  Filled 2018-04-11 (×4): qty 1

## 2018-04-11 MED ORDER — NEPRO/CARBSTEADY PO LIQD
237.0000 mL | Freq: Two times a day (BID) | ORAL | Status: DC
  Administered 2018-04-11 – 2018-04-14 (×6): 237 mL via ORAL

## 2018-04-11 MED ORDER — POTASSIUM PHOSPHATES 15 MMOLE/5ML IV SOLN
10.0000 mmol | Freq: Once | INTRAVENOUS | Status: AC
  Administered 2018-04-11: 10 mmol via INTRAVENOUS
  Filled 2018-04-11: qty 3.33

## 2018-04-11 NOTE — Progress Notes (Signed)
[employer] - [place] at [facility]                                                                                                                                                                                  Patient Demographics   Annette Miller, is a 67 y.o. female, DOB - 14-Aug-1951, NGE:[id number]  Admit date - 04/05/2018   Admitting Physician Cammy Copa, MD  Outpatient Primary MD for the patient is Crissman, Redge Gainer, MD   LOS - 5  Subjective: Patient doing better wants to advance her diet   Review of Systems:   CONSTITUTIONAL: No documented fever. No fatigue, weakness. No weight gain, no weight loss.  EYES: No blurry or double vision.  ENT: No tinnitus. No postnasal drip. No redness of the oropharynx.  RESPIRATORY: No cough, no wheeze, no hemoptysis. No dyspnea.  CARDIOVASCULAR: No chest pain. No orthopnea. No palpitations. No syncope.  GASTROINTESTINAL: No nausea, no vomiting or diarrhea.  Positive abdominal pain. No melena or hematochezia.  GENITOURINARY:  No urgency. No frequency. No dysuria. No hematuria. No obstructive symptoms. No discharge. No pain. No significant abnormal bleeding ENDOCRINE: No polyuria or nocturia. No heat or cold intolerance.  HEMATOLOGY: No anemia. No bruising. No bleeding. No purpura. No petechiae INTEGUMENTARY: No rashes. No lesions.  MUSCULOSKELETAL: No arthritis. No swelling. No gout.  NEUROLOGIC: No numbness, tingling, or ataxia. No seizure-type activity.  PSYCHIATRIC: No anxiety. No insomnia. No ADD.     Vitals:   Vitals:   04/10/18 1428 04/10/18 1926 04/11/18 0603 04/11/18 1203  BP: (!) 145/82 (!) 143/92 (!) 143/75 (!) 148/81  Pulse:  79 79 79  Resp:  16 18 16   Temp:  98.1 F (36.7 C) 98.5 F (36.9 C) 99.2 F (37.3 C)  TempSrc:  Oral Oral Oral  SpO2:  94% 98% 99%  Weight:      Height:        Wt Readings from Last 3 Encounters:  04/10/18 52.6 kg (116 lb)  03/24/18 38.8 kg (85 lb 9.6 oz)  03/01/18 50 kg (110  lb 3.7 oz)     Intake/Output Summary (Last 24 hours) at 04/11/2018 1420 Last data filed at 04/11/2018 1350 Gross per 24 hour  Intake 1638.63 ml  Output 2075 ml  Net -436.37 ml    Physical Exam:   GENERAL: Critically ill sedated HEAD, EYES, EARS, NOSE AND THROAT: Atraumatic, normocephalic. Pupils equal and reactive to light. Sclerae anicteric. No conjunctival injection. No oro-pharyngeal erythema.  NECK: Supple. There is no jugular venous distention. No bruits, no lymphadenopathy, no thyromegaly.  HEART: Regular rate and rhythm,. No murmurs, no rubs, no clicks.  LUNGS: Clear to auscultation  bilaterally. No rales or rhonchi. No wheezes.  ABDOMEN: Soft, flat, nontender, nondistended.  Colostomy bag is in place EXTREMITIES: No evidence of any cyanosis, clubbing, or peripheral edema.  +2 pedal and radial pulses bilaterally.  NEUROLOGIC: Follow commands SKIN: Moist and warm with no rashes appreciated.  Psych: Follow commands LN: No inguinal LN enlargement    Antibiotics   Anti-infectives (From admission, onward)   Start     Dose/Rate Route Frequency Ordered Stop   04/06/18 0515  metroNIDAZOLE (FLAGYL) IVPB 500 mg  Status:  Discontinued     500 mg 100 mL/hr over 60 Minutes Intravenous Every 8 hours 04/06/18 0504 04/07/18 1022   04/06/18 0100  piperacillin-tazobactam (ZOSYN) IVPB 3.375 g  Status:  Discontinued     3.375 g 12.5 mL/hr over 240 Minutes Intravenous Every 8 hours 04/06/18 0045 04/08/18 0725   04/05/18 1845  piperacillin-tazobactam (ZOSYN) IVPB 3.375 g     3.375 g 100 mL/hr over 30 Minutes Intravenous  Once 04/05/18 1840 04/05/18 1955      Medications   Scheduled Meds: . feeding supplement (NEPRO CARB STEADY)  237 mL Oral BID BM  . insulin aspart  0-9 Units Subcutaneous Q4H  . multivitamin with minerals  1 tablet Oral Daily  . multivitamin-lutein  1 capsule Oral Daily  . pantoprazole  40 mg Oral BID  . sodium chloride flush  10-40 mL Intracatheter Q12H    Continuous Infusions: . potassium PHOSPHATE IVPB (in mmol) 10 mmol (04/11/18 1054)  . potassium PHOSPHATE IVPB (in mmol)     PRN Meds:.acetaminophen **OR** acetaminophen, bisacodyl, diphenoxylate-atropine, fentaNYL (SUBLIMAZE) injection, hydrALAZINE, [DISCONTINUED] ondansetron **OR** ondansetron (ZOFRAN) IV, oxyCODONE, sodium chloride flush   Data Review:   Micro Results Recent Results (from the past 240 hour(s))  Blood Culture (routine x 2)     Status: None   Collection Time: 04/05/18  7:08 PM  Result Value Ref Range Status   Specimen Description BLOOD BLOOD RIGHT WRIST  Final   Special Requests   Final    BOTTLES DRAWN AEROBIC AND ANAEROBIC Blood Culture adequate volume   Culture   Final    NO GROWTH 5 DAYS Performed at [facility], [address] [place], [place] [address]    Report Status 04/10/2018 FINAL  Final  Blood Culture (routine x 2)     Status: None   Collection Time: 04/05/18  7:09 PM  Result Value Ref Range Status   Specimen Description BLOOD LEFT ANTECUBITAL  Final   Special Requests   Final    BOTTLES DRAWN AEROBIC AND ANAEROBIC Blood Culture results may not be optimal due to an inadequate volume of blood received in culture bottles   Culture   Final    NO GROWTH 5 DAYS Performed at [facility], [address] [place], [place] [address]    Report Status 04/10/2018 FINAL  Final  MRSA PCR Screening     Status: None   Collection Time: 04/06/18  8:09 PM  Result Value Ref Range Status   MRSA by PCR NEGATIVE NEGATIVE Final    Comment:        The GeneXpert MRSA Assay (FDA approved for NASAL specimens only), is one component of a comprehensive MRSA colonization surveillance program. It is not intended to diagnose MRSA infection nor to guide or monitor treatment for MRSA infections. Performed at [facility], [address] [place], [place] [address]     Radiology Reports Dg Chest 2 View  Result Date:  04/05/2018 CLINICAL  DATA:  Chest pain EXAM: CHEST - 2 VIEW COMPARISON:  March 04, 2018 FINDINGS: Nonacute proximal right humeral fracture. The heart, hila, mediastinum, lungs, and pleura are otherwise normal. IMPRESSION: Nonacute proximal right humeral fracture.  No acute abnormality. Electronically Signed   By: Gerome Sam III M.D   On: 04/05/2018 18:17   Dg Abd 1 View  Result Date: 04/06/2018 CLINICAL DATA:  Orogastric tube placement. EXAM: ABDOMEN - 1 VIEW COMPARISON:  Radiograph of March 06, 2018. FINDINGS: The bowel gas pattern is normal. No radio-opaque calculi or other significant radiographic abnormality are seen. Distal tip of nasogastric tube is seen in proximal stomach. IMPRESSION: No evidence of bowel obstruction or ileus. Distal tip of nasogastric tube is seen in proximal stomach. Electronically Signed   By: Lupita Raider, M.D.   On: 04/06/2018 20:32   Ct Abdomen Pelvis W Contrast  Result Date: 04/05/2018 CLINICAL DATA:  Left upper quadrant abdominal pain with nausea and vomiting. Recent partial colectomy and colostomy on 02/25/2018 for perforated sigmoid diverticulitis. EXAM: CT ABDOMEN AND PELVIS WITH CONTRAST TECHNIQUE: Multidetector CT imaging of the abdomen and pelvis was performed using the standard protocol following bolus administration of intravenous contrast. CONTRAST:  75mL OMNIPAQUE IOHEXOL 300 MG/ML  SOLN COMPARISON:  CT abdomen pelvis dated Feb 25, 2018. FINDINGS: Lower chest: No acute abnormality. Hepatobiliary: Diffuse hepatic steatosis. The gallbladder remains mildly distended. No gallbladder wall thickening or gallstones. Unchanged mild dilatation of the common bile duct, measuring up to 10 mm. Pancreas: Unremarkable. No pancreatic ductal dilatation or surrounding inflammatory changes. Spleen: Normal in size without focal abnormality. Adrenals/Urinary Tract: Adrenal glands are unremarkable. Kidneys are normal, without renal calculi, focal lesion, or hydronephrosis. Bladder is  unremarkable. Stomach/Bowel: Postsurgical changes related to sigmoid colectomy with left lower quadrant colostomy. There is moderate circumferential wall thickening of the proximal descending colon extending to the ostomy. The appendix is surgically absent. Improving but persistent wall thickening along the antrum of the stomach and first portion of the duodenum. Fecalization of distal small bowel contents likely represents delayed transit. No bowel obstruction. Vascular/Lymphatic: Aortic atherosclerosis. No enlarged abdominal or pelvic lymph nodes. Reproductive: Status post hysterectomy. No adnexal masses. Other: 2.0 x 2.5 cm rounded area of inflammatory fat stranding in the right mid abdomen. No free fluid or pneumoperitoneum. Mild presacral inflammatory stranding. Open midline abdominal surgical incision. Musculoskeletal: No acute or significant osseous findings. IMPRESSION: 1. Postsurgical changes related to sigmoid colectomy with left lower quadrant colostomy. Moderate circumferential wall thickening of the proximal descending colon extending to the ostomy, consistent with colitis. 2. Improving but persistent wall thickening along the antrum of the stomach and first portion of the duodenum with adjacent prominent lymph nodes which are slightly increased in size, now measuring up to 8 mm in short axis. Correlation with endoscopy is recommended if not previously performed. 3. Unchanged mild dilatation of the common bile duct and gallbladder distention. Correlate with LFTs and consider further evaluation with ERCP or MRCP as clinically indicated. 4. New 2.0 x 2.5 cm rounded area of inflammatory fat stranding in the right mid abdomen may represent omental infarct. Electronically Signed   By: Obie Dredge M.D.   On: 04/05/2018 21:32   Dg Chest Port 1 View  Result Date: 04/07/2018 CLINICAL DATA:  Encounter for central line placement EXAM: PORTABLE CHEST 1 VIEW COMPARISON:  Earlier today FINDINGS: New left IJ  line with tip at the SVC. No pneumothorax or mediastinal widening. Endotracheal tube tip between the clavicular heads and carina.  An orogastric tube tip is at the gastric fundus. The lungs remain clear. Normal heart size IMPRESSION: 1. No adverse finding after left IJ line placement. 2. No evidence of active cardiopulmonary disease. Electronically Signed   By: Marnee Spring M.D.   On: 04/07/2018 00:14   Dg Chest Port 1 View  Result Date: 04/06/2018 CLINICAL DATA:  Endotracheal tube placement. EXAM: PORTABLE CHEST 1 VIEW COMPARISON:  Radiographs of April 05, 2018. FINDINGS: The heart size and mediastinal contours are within normal limits. Endotracheal tube is seen projected over tracheal air shadow with distal tip 2 cm above the carina. Nasogastric tube tip is seen in proximal stomach. There is no evidence of bowel obstruction or ileus. Both lungs are clear. Stable old proximal right humeral fracture is noted. IMPRESSION: Endotracheal and nasogastric tubes are in grossly good position. No acute pulmonary abnormality is noted. Electronically Signed   By: Lupita Raider, M.D.   On: 04/06/2018 20:31   [place] Ekg Site Rite  Result Date: 04/07/2018 If Site Rite image not attached, placement could not be confirmed due to current cardiac rhythm.    CBC Recent Labs  Lab 04/06/18 1926  04/08/18 0232 04/08/18 0554 04/09/18 0501 04/10/18 0544 04/11/18 0529  WBC 6.9  --   --  5.7 6.7 6.5 5.8  HGB 5.8*   < > 12.5 12.4 12.2 12.6 13.2  HCT 16.8*   < > 36.0 36.0 34.7* 35.3 37.4  PLT 335  --   --  156 141* 154 181  MCV 103.9*  --   --  93.4 93.6 93.9 94.1  MCH 36.1*  --   --  32.2 33.0 33.4 33.2  MCHC 34.7  --   --  34.5 35.3 35.6 35.3  RDW 22.1*  --   --  15.8* 15.6* 16.1* 15.6*  LYMPHSABS  --   --   --  1.3  --  1.2 1.0  MONOABS  --   --   --  0.4  --  0.5 0.6  EOSABS  --   --   --  0.5  --  0.6 0.5  BASOSABS  --   --   --  0.0  --  0.1 0.0   < > = values in this interval not displayed.     Chemistries  Recent Labs  Lab 04/05/18 1909  04/06/18 1926  04/07/18 0644 04/08/18 0554 04/08/18 1737 04/09/18 0501 04/10/18 0544 04/10/18 1751 04/11/18 0529  NA  --    < > 140  --  141 143  --  141 136  --  136  K  --    < > 3.6  --  2.9* 3.5 3.8 3.6 3.8 4.4 3.8  CL  --    < > 111  --  114* 117*  --  112* 107  --  106  CO2  --    < > 23  --  23 24  --  25 27  --  25  GLUCOSE  --    < > 116*  --  65* 97  --  104* 102*  --  112*  BUN  --    < > 22  --  18 16  --  12 10  --  7*  CREATININE  --    < > 1.31*  --  1.00 0.81  --  0.61 0.53  --  0.42*  CALCIUM  --    < > 7.1*  --  6.5*  6.7*  --  6.5* 6.4*  --  6.7*  MG  --   --   --    < > 1.3* 2.1  --  1.8 1.5* 1.9 1.8  AST 32  --  24  --   --  15  --   --   --   --  21  ALT 24  --  15  --   --  13  --   --   --   --  13  ALKPHOS 114  --  74  --   --  63  --   --   --   --  79  BILITOT 0.6  --  0.5  --   --  0.6  --   --   --   --  0.5   < > = values in this interval not displayed.   ------------------------------------------------------------------------------------------------------------------ estimated creatinine clearance is 54 mL/min (A) (by C-G formula based on SCr of 0.42 mg/dL (L)). ------------------------------------------------------------------------------------------------------------------ No results for input(s): HGBA1C in the last 72 hours. ------------------------------------------------------------------------------------------------------------------ Recent Labs    04/11/18 0529  TRIG 144   ------------------------------------------------------------------------------------------------------------------ No results for input(s): TSH, T4TOTAL, T3FREE, THYROIDAB in the last 72 hours.  Invalid input(s): FREET3 ------------------------------------------------------------------------------------------------------------------ No results for input(s): VITAMINB12, FOLATE, FERRITIN, TIBC, IRON, RETICCTPCT in the  last 72 hours.  Coagulation profile Recent Labs  Lab 04/06/18 1925  INR 1.30    No results for input(s): DDIMER in the last 72 hours.  Cardiac Enzymes Recent Labs  Lab 04/05/18 1908 04/05/18 2211  TROPONINI 0.03* 0.03*   ------------------------------------------------------------------------------------------------------------------ Invalid input(s): POCBNP    Assessment & Plan   1. upper GI bleed with a large duodenal ulcer Status post vascular embolization Hemoglobin stable Hemoglobin stable Continue protonix Advance diet  2.  Acute respiratory failure continue ventilator support Resolved   3.  Possible.  Acute colitis with antibiotics discontinued by the intensivist Advance diet TPN discontinued  4.  Acute UTI.    Patient's antibiotics discontinued treated  5.  Severe anemia, likely multifactorial, related to chronic anemia, worsened by acute blood loss transfuse as needed hemoglobin now normal  6.  Acute renal failure, likely prerenal.    Continue IV fluids follow renal function  7.  Tobacco abuse.  Nicotine patch   8.  Elevated blood pressure use IV hydralazine PRN  9.  Electrolyte imbalances replace electrolyte pharmacy      Code Status Orders  (From admission, onward)        Start     Ordered   04/06/18 0229  Full code  Continuous     04/06/18 0229    Code Status History    Date Active Date Inactive Code Status Order ID Comments User Context   02/25/2018 1326 03/12/2018 1730 Full Code [id number]  Star Age, DO Inpatient    Advance Directive Documentation     Most Recent Value  Type of Advance Directive  Healthcare Power of Attorney, Living will  Pre-existing out of facility DNR order (yellow form or pink MOST form)  -  "MOST" Form in Place?  -           Consults GI ,SURGERY   DVT Prophylaxis  SCD'S  Lab Results  Component Value Date   PLT 181 04/11/2018     Time Spent in minutes    Greater than 50% of time  spent in care coordination and counseling patient regarding the condition and plan of care.  Auburn Bilberry M.D on 04/11/2018 at 2:20 PM  Between 7am to 6pm - Pager - [phone]  After 6pm go to [email or web address] - Social research officer, government  [employer]   Office  [phone]

## 2018-04-11 NOTE — Progress Notes (Signed)
PHARMACY - E CONSULT NOTE   Pharmacy Consult for Electrolyte Management Indication: refeeding syndrome   Patient Measurements: Height: 5\' 2"  (157.5 cm) Weight: 116 lb (52.6 kg)(Unable to weight, Pt unable to stand and bed not zeroed out.) IBW/kg (Calculated) : 50.1 TPN AdjBW (KG): 44.8 Body mass index is 21.22 kg/m.  Sodium (mmol/L)  Date Value  04/11/2018 136  05/20/2017 139   Potassium (mmol/L)  Date Value  04/11/2018 3.8   Magnesium (mg/dL)  Date Value  16/10/960407/15/2019 1.8   Phosphorus (mg/dL)  Date Value  54/09/811907/15/2019 2.3 (L)   Calcium (mg/dL)  Date Value  14/78/295607/15/2019 6.7 (L)   Albumin (g/dL)  Date Value  21/30/865707/15/2019 1.7 (L)  05/20/2017 3.9   CBG (last 3)  Recent Labs    04/10/18 1850 04/11/18 0804 04/11/18 1146  GLUCAP 94 79 88     Pharmacy consulted for electrolyte management for 67 yo female admitted with upper GI bleed. Patient with recent history significant for partial colectomy and ostomy for perforated sigmoid diverticulitis. Patient with embolization on 7/11. Patient was on TPN from 7/11 to 7/14.  Assessment/Plan:  Potassium phosphate 10 mmoL IV x 1. Will recheck electrolytes in AM.   Clovia CuffLisa Gracie Gupta, PharmD, BCPS 04/11/2018 12:14 PM

## 2018-04-11 NOTE — Progress Notes (Signed)
Physical Therapy Evaluation Patient Details Name: Annette Mattocksamela S Hunsinger MRN: [id number] DOB: 1951-08-13 Today's Date: 04/11/2018   History of Present Illness  Harvest Forestamela Navarro  is a 67 y.o. female with a known history of hyperlipidemia, chronic anemia, osteoporosis and hypothyroidism.  Patient is status post recent partial colectomy and ostomy for perforated sigmoid diverticulitis. She was brought to emergency room for severe generalized weakness and lightheadedness going on for the past 2 to 3 days, gradually getting worse.  She also complains of nausea vomiting and left upper quadrant abdominal pain x 24hr. Per patient, the surgical wound has been healing well, no purulent drainage was noted.  Her ostomy pouch has been putting out normal stool, although she recalls black stools 2 days ago.  She denies having fever or chills.  She has been using more ibuprofen and meloxicam in the past few days due to arthritis pain.  She continues to smoke daily. Blood test done emergency room are remarkable for hemoglobin level of 5.9, creatinine level 1.67.  Lactic acid level is 2.3.  UA is positive for UTI. CT scan of the abdomen reveals moderate circumferential wall thickening of the proximal descending colon extending to the ostomy, consistent with colitis. She is now s/p vascular emobolization for upper GIB.   Clinical Impression  Pt admitted with above diagnosis. Pt currently with functional limitations due to the deficits listed below (see PT Problem List).  Modified independent for bed mobility however increased time and use of bed rails required. Pt requires multiple attempts and minA+1 to come to standing. In standing she is initally unsteady requiring UE support and minA+1 from therapist to prevent loss of balance. PT also practiced toilet transfers with patient with cues for safe sequencing. Pt is able to ambulate a short distance in the room however is unsteady and requires minA+1 to stabilize. She is unstable and  unsafe to ambulate farther currently. Currently recommend SNF placement for patient however she is eager to go home. She is a very high fall risk currently. If she decides to return home she should have assistance whenever she is ambulating. Pt will benefit from PT services to address deficits in strength, balance, and mobility in order to return to full function at home.       Follow Up Recommendations SNF    Equipment Recommendations  3in1 (PT)    Recommendations for Other Services       Precautions / Restrictions Precautions Precautions: Fall Restrictions Weight Bearing Restrictions: No      Mobility  Bed Mobility Overal bed mobility: Modified Independent             General bed mobility comments: Increased time, use of bed rails  Transfers Overall transfer level: Needs assistance Equipment used: Rolling walker (2 wheeled) Transfers: Sit to/from Stand Sit to Stand: Min assist         General transfer comment: Pt requires multiple attempts and minA+1 to come to standing. In standing she is initally unsteady requiring UE support and minA+1 from therapist to prevent loss of balance. PT also practiced toilet transfers with patient with cues for safe sequencing  Ambulation/Gait Ambulation/Gait assistance: Min guard Gait Distance (Feet): 8 Feet Assistive device: Rolling walker (2 wheeled)       General Gait Details: Pt is able to ambulate a short distance in the room however is unsteady and requires minA+1 to stabilize. She is unstable and unsafe to ambulate farther currently  Stairs  Wheelchair Mobility    Modified Rankin (Stroke Patients Only)       Balance Overall balance assessment: Needs assistance Sitting-balance support: No upper extremity supported Sitting balance-Leahy Scale: Fair     Standing balance support: No upper extremity supported Standing balance-Leahy Scale: Poor Standing balance comment: Requires assist to stabilize                              Pertinent Vitals/Pain Pain Assessment: 0-10 Pain Score: 7  Pain Location: Abdomen Pain Intervention(s): Monitored during session    Home Living Family/patient expects to be discharged to:: Private residence Living Arrangements: Children Available Help at Discharge: Family Type of Home: House Home Access: Stairs to enter Entrance Stairs-Rails: None Entrance Stairs-Number of Steps: 2 Home Layout: Multi-level;Bed/bath upstairs Home Equipment: Walker - 2 wheels      Prior Function Level of Independence: Independent         Comments: Pt previously independent with ADLs/IADLs per her report. States she has had one fall in the last 12 months     Hand Dominance   Dominant Hand: Left    Extremity/Trunk Assessment   Upper Extremity Assessment Upper Extremity Assessment: Generalized weakness    Lower Extremity Assessment Lower Extremity Assessment: Generalized weakness       Communication   Communication: No difficulties  Cognition Arousal/Alertness: Awake/alert Behavior During Therapy: WFL for tasks assessed/performed Overall Cognitive Status: Within Functional Limits for tasks assessed                                        General Comments      Exercises     Assessment/Plan    PT Assessment Patient needs continued PT services  PT Problem List Decreased strength;Decreased activity tolerance;Decreased balance       PT Treatment Interventions DME instruction;Gait training;Functional mobility training;Therapeutic activities;Therapeutic exercise;Stair training;Balance training;Neuromuscular re-education    PT Goals (Current goals can be found in the Care Plan section)  Acute Rehab PT Goals Patient Stated Goal: Return to prior level of function PT Goal Formulation: With patient Time For Goal Achievement: 04/25/18 Potential to Achieve Goals: Good    Frequency Min 2X/week   Barriers to discharge         Co-evaluation               AM-PAC PT "6 Clicks" Daily Activity  Outcome Measure Difficulty turning over in bed (including adjusting bedclothes, sheets and blankets)?: A Little Difficulty moving from lying on back to sitting on the side of the bed? : A Lot Difficulty sitting down on and standing up from a chair with arms (e.g., wheelchair, bedside commode, etc,.)?: Unable Help needed moving to and from a bed to chair (including a wheelchair)?: A Little Help needed walking in hospital room?: A Little Help needed climbing 3-5 steps with a railing? : A Lot 6 Click Score: 14    End of Session Equipment Utilized During Treatment: Gait belt Activity Tolerance: Patient tolerated treatment well Patient left: in bed;with call bell/phone within reach;with nursing/sitter in room Nurse Communication: Mobility status PT Visit Diagnosis: Unsteadiness on feet (R26.81);Muscle weakness (generalized) (M62.81);Difficulty in walking, not elsewhere classified (R26.2)    Time: 4540-9811 PT Time Calculation (min) (ACUTE ONLY): 23 min   Charges:   PT Evaluation $PT Eval Low Complexity: 1 Low PT Treatments $Therapeutic Activity: 8-22 mins  PT G Codes:        Sharalyn Ink Michaella Imai PT, DPT, GCS   Kayvion Arneson 04/11/2018, 5:22 PM

## 2018-04-11 NOTE — Care Management Important Message (Signed)
Copy of signed IM left with patient in room.  

## 2018-04-11 NOTE — Discharge Instructions (Signed)
Nutrition Post Hospital Stay Proper nutrition can help your body recover from illness and injury.   Foods and beverages high in protein, vitamins, and minerals help rebuild muscle loss, promote healing, & reduce fall risk.   In addition to eating healthy foods, a nutrition shake is an easy, delicious way to get the nutrition you need during and after your hospital stay  It is recommended that you continue to drink 2 bottles per day of any protein supplement you choose.   Tips for adding a nutrition shake into your routine: As allowed, drink one with vitamins or medications instead of water or juice Enjoy one as a tasty mid-morning or afternoon snack Drink cold or make a milkshake out of it Drink one instead of milk with cereal or snacks Use as a coffee creamer           For COUPONS visit: www.ensure.com/join or RoleLink.com.brwww.boost.com/members/sign-up   Suggested Substitutions Ensure Plus = Boost Plus = Carnation Breakfast Essentials = Boost Compact  It is also recommended that you take a daily women's multivitamin until your oral intake returns to normal   It was noted that you have been taking daily zinc supplements. Long term Zinc supplementation can have detrimental effects on other minerals in your body. It is not recommended that you take zinc alone but also with Copper (1mg  of copper for every 15mg  of zinc). It was also noted that you have been taking vitamin C daily at home in upwards of 3000mg  per day. Only about 300mg  of vitamin C is absorbed at one time. Excess vitamin C is excreted by the kidneys. It is recommended that you do not exceed 1000mg  vitamin C daily. In the hospital, you were provided with an Ocuvite vitamin (provides zinc, vitamin A, vitamin C, Vitamin E, copper, and selenium). It is recommended that you continue this vitamin at home and discontinue zinc and vitamin C supplements.

## 2018-04-11 NOTE — Progress Notes (Signed)
Nutrition Follow-up  DOCUMENTATION CODES:   Severe malnutrition in context of acute illness/injury  INTERVENTION:   Ensure Enlive po BID, each supplement provides 350 kcal and 20 grams of protein  Magic cup TID with meals, each supplement provides 290 kcal and 9 grams of protein  MVI daily  Ocuvite daily   Soft diet   NUTRITION DIAGNOSIS:   Severe Malnutrition related to acute illness as evidenced by 12 percent weight loss in 5 weeks, moderate fat depletion, severe muscle depletion.  GOAL:   Patient will meet greater than or equal to 90% of their needs -progressing   MONITOR:   PO intake, Supplement acceptance, Labs, Weight trends, Skin, I & O's  ASSESSMENT:    67 y.o. female with a history of perforated sigmoid diverticulitis status post status post partial colectomy with colostomy 02/25/2018 presenting for lightheadedness with standing, nausea and vomiting, chest pain, and left upper quadrant pain. Pt found to have colitis and sepsis    Status post vascular embolization 7/11 for large bleeding duodenal ulcer  Pt advanced to heart healthy diet today; will change to GI soft diet. Pt ate 100% of breakfast today which included cream of chicken soup, grits, and some milk. Pt is drinking some Ensure. Will add MVI to help pt meet her estimated needs. Will order ocuvite as pt takes zinc daily at home and ocuvite provides copper along with the zinc. TPN discontinued yesterday by MD. Per chart, pt with 19lb weight gain since admit; pt +9.4L on I & Os. Pt also with mild generalized edema. Pt continues to refeed; monitor K, Mg, and P daily until labs stabilize.   Medications reviewed and include: insulin, Protonix, KPhos, oxycodone  Labs reviewed: K 3.8 wnl, BUN 7(L), creat 0.42(L), Ca 6.7(L) adj. 8.54(L), P 2.3(L), Mg 1.8 wnl, alb 1.7(L) Triglycerides 144 Prealbumin- 7.7(L)- 7/10 Folate- 5.1(L)- 7/10 Ceruloplasmin- 14.1(L)- 7/10 Copper 72- 7/10 Zinc 56- 7/11 B12- 574-  7/10  Diet Order:  Diet Order           Diet Heart Room service appropriate? Yes; Fluid consistency: Thin  Diet effective now         EDUCATION NEEDS:   Education needs have been addressed  Skin:  Skin Assessment: Reviewed RN Assessment(closed incision to abdomen)  Last BM:  7/14- via ostomy   Height:   Ht Readings from Last 1 Encounters:  04/06/18 5\' 2"  (1.575 m)    Weight:   Wt Readings from Last 1 Encounters:  04/10/18 116 lb (52.6 kg)    Ideal Body Weight:  50 kg  BMI:  Body mass index is 21.22 kg/m.  Estimated Nutritional Needs:   Kcal:  1200-1400kcal/day  Protein:  66-75g/day  Fluid:  >1.3L/day   Annette Holidayasey Aarib Pulido MS, RD, LDN Pager #- [phone] Office#- [phone] After Hours Pager: [phone]

## 2018-04-12 LAB — BASIC METABOLIC PANEL
ANION GAP: 6 (ref 5–15)
BUN: 6 mg/dL — ABNORMAL LOW (ref 8–23)
CALCIUM: 7.3 mg/dL — AB (ref 8.9–10.3)
CO2: 25 mmol/L (ref 22–32)
Chloride: 105 mmol/L (ref 98–111)
Creatinine, Ser: 0.4 mg/dL — ABNORMAL LOW (ref 0.44–1.00)
GFR calc Af Amer: >60 mL/min (ref >60)
GLUCOSE: 88 mg/dL (ref 70–99)
Potassium: 3.9 mmol/L (ref 3.5–5.1)
SODIUM: 136 mmol/L (ref 135–145)

## 2018-04-12 LAB — PHOSPHORUS: Phosphorus: 2.3 mg/dL — ABNORMAL LOW (ref 2.5–4.6)

## 2018-04-12 LAB — MAGNESIUM: MAGNESIUM: 1.5 mg/dL — AB (ref 1.7–2.4)

## 2018-04-12 LAB — GLUCOSE, CAPILLARY
GLUCOSE-CAPILLARY: 82 mg/dL (ref 70–99)
Glucose-Capillary: 108 mg/dL — ABNORMAL HIGH (ref 70–99)
Glucose-Capillary: 97 mg/dL (ref 70–99)
Glucose-Capillary: 97 mg/dL (ref 70–99)

## 2018-04-12 MED ORDER — SUCRALFATE 1 G PO TABS
1.0000 g | ORAL_TABLET | Freq: Three times a day (TID) | ORAL | Status: DC
  Administered 2018-04-12 – 2018-04-14 (×7): 1 g via ORAL
  Filled 2018-04-12 (×7): qty 1

## 2018-04-12 MED ORDER — POTASSIUM PHOSPHATES 15 MMOLE/5ML IV SOLN
15.0000 mmol | Freq: Once | INTRAVENOUS | Status: AC
  Administered 2018-04-12: 15 mmol via INTRAVENOUS
  Filled 2018-04-12: qty 5

## 2018-04-12 MED ORDER — MAGNESIUM SULFATE 4 GM/100ML IV SOLN
4.0000 g | Freq: Once | INTRAVENOUS | Status: AC
  Administered 2018-04-12: 4 g via INTRAVENOUS
  Filled 2018-04-12: qty 100

## 2018-04-12 NOTE — Progress Notes (Signed)
   04/12/18 1004  Clinical Encounter Type  Visited With Patient  Visit Type Follow-up;Psychological support   Routine visit during rounds.  Patient remembers chaplain from a previous hospitalization.  Patient states she may be discharged to home today.  She would like a bedside commode available to her if she is discharged because she sometimes feels unsteady on her feet. 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  She would prefer to go home and have her son, who lives with her, help as needed.  Provided emotional support and encouragement to patient.

## 2018-04-12 NOTE — NC FL2 (Signed)
[place] MEDICAID [id number] LEVEL OF CARE SCREENING TOOL     IDENTIFICATION  Patient Name: Annette Miller Birthdate: [birth year] Sex: female Admission Date (Current Location): 04/05/2018  [facility] and [place] Number:  Chiropodist and Address:  [facility], [address] [place], [place] [address]      Provider Number: [id number]  Attending Physician Name and Address:  Auburn Bilberry, MD  Relative Name and Phone Number:       Current Level of Care: Hospital Recommended Level of Care: Skilled Nursing Facility Prior Approval Number:    Date Approved/Denied:   PASRR Number:    Discharge Plan: SNF    Current Diagnoses: Patient Active Problem List   Diagnosis Date Noted  . Hematemesis   . Symptomatic anemia 04/06/2018  . Protein-calorie malnutrition, severe 02/27/2018  . Diverticulitis of colon with perforation 02/25/2018  . Intestinal perforation (HCC)   . PUD (peptic ulcer disease) 12/15/2017  . Kidney stone 12/08/2017  . Anxiety 05/20/2017  . Allergy 05/20/2017  . Trapezius muscle spasm 01/27/2017  . H/O total hysterectomy 05/12/2016  . Hyperlipidemia 05/08/2015  . Depression 05/08/2015  . Hypothyroidism 05/08/2015    Orientation RESPIRATION BLADDER Height & Weight     Self, Time, Situation, Place  Normal Continent Weight: 133 lb (60.3 kg) Height:  5\' 2"  (157.5 cm)  BEHAVIORAL SYMPTOMS/MOOD NEUROLOGICAL BOWEL NUTRITION STATUS  (none) (none) Colostomy Diet(soft)  AMBULATORY STATUS COMMUNICATION OF NEEDS Skin   Extensive Assist Verbally Normal                       Personal Care Assistance Level of Assistance  Dressing, Bathing Bathing Assistance: Limited assistance   Dressing Assistance: Limited assistance     Functional Limitations Info  (no issues)          SPECIAL CARE FACTORS FREQUENCY  PT (By licensed PT)                    Contractures Contractures Info: Not present    Additional Factors Info   Code Status Code Status Info: full             Current Medications (04/12/2018):  This is the current hospital active medication list Current Facility-Administered Medications  Medication Dose Route Frequency Provider Last Rate Last Dose  . acetaminophen (TYLENOL) tablet 650 mg  650 mg Oral Q6H PRN Annice Needy, MD   650 mg at 04/11/18 1240   Or  . acetaminophen (TYLENOL) suppository 650 mg  650 mg Rectal Q6H PRN Annice Needy, MD      . bisacodyl (DULCOLAX) EC tablet 5 mg  5 mg Oral Daily PRN Annice Needy, MD      . diphenoxylate-atropine (LOMOTIL) 2.5-0.025 MG per tablet 1 tablet  1 tablet Oral QID PRN Toney Reil, MD      . feeding supplement (NEPRO CARB STEADY) liquid 237 mL  237 mL Oral BID BM Auburn Bilberry, MD   237 mL at 04/12/18 0941  . fentaNYL (SUBLIMAZE) injection 25 mcg  25 mcg Intravenous Q2H PRN Merwyn Katos, MD   25 mcg at 04/12/18 [phone]  . hydrALAZINE (APRESOLINE) injection 10 mg  10 mg Intravenous Q6H PRN Auburn Bilberry, MD      . insulin aspart (novoLOG) injection 0-9 Units  0-9 Units Subcutaneous TID AC & HS Salary, Montell D, MD      . multivitamin with minerals tablet 1 tablet  1 tablet Oral  Daily Auburn BilberryPatel, Shreyang, MD   1 tablet at 04/12/18 [phone]  . multivitamin-lutein (OCUVITE-LUTEIN) capsule 1 capsule  1 capsule Oral Daily Auburn BilberryPatel, Shreyang, MD      . ondansetron [facility](ZOFRAN) injection 4 mg  4 mg Intravenous Q6H PRN Annice Needyew, Jason S, MD   4 mg at 04/12/18 0942  . oxyCODONE (Oxy IR/ROXICODONE) immediate release tablet 5 mg  5 mg Oral Q3H PRN Merwyn KatosSimonds, David B, MD   5 mg at 04/12/18 0814  . pantoprazole (PROTONIX) EC tablet 40 mg  40 mg Oral BID Midge MiniumWohl, Darren, MD   40 mg at 04/12/18 0942  . potassium PHOSPHATE 15 mmol in dextrose 5 % 250 mL infusion  15 mmol Intravenous Once Auburn BilberryPatel, Shreyang, MD 43 mL/hr at 04/12/18 1104 15 mmol at 04/12/18 1104  . potassium PHOSPHATE 30 mmol in dextrose 5 % 500 mL infusion  30 mmol Intravenous Once Valentina GuChristy, Scott D, RPH      . sodium  chloride flush (NS) 0.9 % injection 10-40 mL  10-40 mL Intracatheter Q12H Auburn BilberryPatel, Shreyang, MD   10 mL at 04/12/18 0942  . sodium chloride flush (NS) 0.9 % injection 10-40 mL  10-40 mL Intracatheter PRN Auburn BilberryPatel, Shreyang, MD         Discharge Medications: Please see discharge summary for a list of discharge medications.  Relevant Imaging Results:  Relevant Lab Results:   Additional Information ss: [id number]  [name], LCSW

## 2018-04-12 NOTE — Progress Notes (Signed)
PHARMACY - E CONSULT NOTE   Pharmacy Consult for Electrolyte Management Indication: refeeding syndrome   Patient Measurements: Height: 5\' 2"  (157.5 cm) Weight: 133 lb (60.3 kg) IBW/kg (Calculated) : 50.1 TPN AdjBW (KG): 44.8 Body mass index is 24.33 kg/m.  Sodium (mmol/L)  Date Value  04/12/2018 136  05/20/2017 139   Potassium (mmol/L)  Date Value  04/12/2018 3.9   Magnesium (mg/dL)  Date Value  40/98/119107/16/2019 1.5 (L)   Phosphorus (mg/dL)  Date Value  47/82/956207/16/2019 2.3 (L)   Calcium (mg/dL)  Date Value  13/08/657807/16/2019 7.3 (L)   Albumin (g/dL)  Date Value  46/96/295207/15/2019 1.7 (L)  05/20/2017 3.9   CBG (last 3)  Recent Labs    04/11/18 1714 04/11/18 2112 04/12/18 0737  GLUCAP 98 [phone]     Pharmacy consulted for electrolyte management for 67 yo female admitted with upper GI bleed. Patient with recent history significant for partial colectomy and ostomy for perforated sigmoid diverticulitis. Patient with embolization on 7/11. Patient was on TPN from 7/11 to 7/14.  Assessment/Plan:  K 3.9, Mag 1.5, Phos 2.3  Scr 0.40  Ca 7.3 Albumin 1.7   Albumin corrected calcium 9.1 Will order Magnesium sulfate 4 gram IV x 1 and  Potassium phosphate 15 mmoL IV x 1. Will recheck electrolytes in AM.   Bari MantisKristin Sheenah Dimitroff PharmD Clinical Pharmacist 04/12/2018

## 2018-04-12 NOTE — Progress Notes (Signed)
[facility] - [place] at [facility]                                                                                                                                                                                  Patient Demographics   Annette Miller, is a 68 y.o. female, DOB - 1951-01-04, ZOX:[id number]  Admit date - 04/05/2018   Admitting Physician Cammy Copa, MD  Outpatient Primary MD for the patient is Steele Sizer, MD   LOS - 6  Subjective: Complains of intermittent pain at the epigastric region no good nausea or vomiting no further blood noted   Review of Systems:   CONSTITUTIONAL: No documented fever. No fatigue, weakness. No weight gain, no weight loss.  EYES: No blurry or double vision.  ENT: No tinnitus. No postnasal drip. No redness of the oropharynx.  RESPIRATORY: No cough, no wheeze, no hemoptysis. No dyspnea.  CARDIOVASCULAR: No chest pain. No orthopnea. No palpitations. No syncope.  GASTROINTESTINAL: No nausea, no vomiting or diarrhea.  Positive abdominal pain. No melena or hematochezia.  GENITOURINARY:  No urgency. No frequency. No dysuria. No hematuria. No obstructive symptoms. No discharge. No pain. No significant abnormal bleeding ENDOCRINE: No polyuria or nocturia. No heat or cold intolerance.  HEMATOLOGY: No anemia. No bruising. No bleeding. No purpura. No petechiae INTEGUMENTARY: No rashes. No lesions.  MUSCULOSKELETAL: No arthritis. No swelling. No gout.  NEUROLOGIC: No numbness, tingling, or ataxia. No seizure-type activity.  PSYCHIATRIC: No anxiety. No insomnia. No ADD.     Vitals:   Vitals:   04/11/18 1203 04/11/18 1923 04/12/18 0517 04/12/18 1218  BP: (!) 148/81 124/88 (!) 148/89 (!) 143/75  Pulse: 79 80 81 84  Resp: 16 16 16 16   Temp: 99.2 F (37.3 C) 98.3 F (36.8 C) 98.6 F (37 C) 98.5 F (36.9 C)  TempSrc: Oral Oral Oral Oral  SpO2: 99% 98% 98% 98%  Weight:   60.3 kg (133 lb)   Height:        Wt Readings from Last 3  Encounters:  04/12/18 60.3 kg (133 lb)  03/24/18 38.8 kg (85 lb 9.6 oz)  03/01/18 50 kg (110 lb 3.7 oz)     Intake/Output Summary (Last 24 hours) at 04/12/2018 1422 Last data filed at 04/12/2018 1409 Gross per 24 hour  Intake 276.23 ml  Output 2625 ml  Net -2348.77 ml    Physical Exam:   GENERAL: Critically ill sedated HEAD, EYES, EARS, NOSE AND THROAT: Atraumatic, normocephalic. Pupils equal and reactive to light. Sclerae anicteric. No conjunctival injection. No oro-pharyngeal erythema.  NECK: Supple. There is no jugular venous distention. No bruits, no lymphadenopathy, no thyromegaly.  HEART: Regular  rate and rhythm,. No murmurs, no rubs, no clicks.  LUNGS: Clear to auscultation bilaterally. No rales or rhonchi. No wheezes.  ABDOMEN: Soft, flat, nontender, nondistended.  Colostomy bag is in place EXTREMITIES: No evidence of any cyanosis, clubbing, or peripheral edema.  +2 pedal and radial pulses bilaterally.  NEUROLOGIC: Follow commands SKIN: Moist and warm with no rashes appreciated.  Psych: Follow commands LN: No inguinal LN enlargement    Antibiotics   Anti-infectives (From admission, onward)   Start     Dose/Rate Route Frequency Ordered Stop   04/06/18 0515  metroNIDAZOLE (FLAGYL) IVPB 500 mg  Status:  Discontinued     500 mg 100 mL/hr over 60 Minutes Intravenous Every 8 hours 04/06/18 0504 04/07/18 1022   04/06/18 0100  piperacillin-tazobactam (ZOSYN) IVPB 3.375 g  Status:  Discontinued     3.375 g 12.5 mL/hr over 240 Minutes Intravenous Every 8 hours 04/06/18 0045 04/08/18 0725   04/05/18 1845  piperacillin-tazobactam (ZOSYN) IVPB 3.375 g     3.375 g 100 mL/hr over 30 Minutes Intravenous  Once 04/05/18 1840 04/05/18 1955      Medications   Scheduled Meds: . feeding supplement (NEPRO CARB STEADY)  237 mL Oral BID BM  . insulin aspart  0-9 Units Subcutaneous TID AC & HS  . multivitamin with minerals  1 tablet Oral Daily  . multivitamin-lutein  1 capsule Oral  Daily  . pantoprazole  40 mg Oral BID  . sodium chloride flush  10-40 mL Intracatheter Q12H   Continuous Infusions: . potassium PHOSPHATE IVPB (in mmol) 15 mmol (04/12/18 1104)  . potassium PHOSPHATE IVPB (in mmol)     PRN Meds:.acetaminophen **OR** acetaminophen, bisacodyl, diphenoxylate-atropine, fentaNYL (SUBLIMAZE) injection, hydrALAZINE, [DISCONTINUED] ondansetron **OR** ondansetron (ZOFRAN) IV, oxyCODONE, sodium chloride flush   Data Review:   Micro Results Recent Results (from the past 240 hour(s))  Blood Culture (routine x 2)     Status: None   Collection Time: 04/05/18  7:08 PM  Result Value Ref Range Status   Specimen Description BLOOD BLOOD RIGHT WRIST  Final   Special Requests   Final    BOTTLES DRAWN AEROBIC AND ANAEROBIC Blood Culture adequate volume   Culture   Final    NO GROWTH 5 DAYS Performed at [facility], [address] [place], [place] [address]    Report Status 04/10/2018 FINAL  Final  Blood Culture (routine x 2)     Status: None   Collection Time: 04/05/18  7:09 PM  Result Value Ref Range Status   Specimen Description BLOOD LEFT ANTECUBITAL  Final   Special Requests   Final    BOTTLES DRAWN AEROBIC AND ANAEROBIC Blood Culture results may not be optimal due to an inadequate volume of blood received in culture bottles   Culture   Final    NO GROWTH 5 DAYS Performed at [facility], [address] [place], [place] [address]    Report Status 04/10/2018 FINAL  Final  MRSA PCR Screening     Status: None   Collection Time: 04/06/18  8:09 PM  Result Value Ref Range Status   MRSA by PCR NEGATIVE NEGATIVE Final    Comment:        The GeneXpert MRSA Assay (FDA approved for NASAL specimens only), is one component of a comprehensive MRSA colonization surveillance program. It is not intended to diagnose MRSA infection nor to guide or monitor treatment for MRSA infections. Performed at [facility], [address]  [place],  [place] [address]     Radiology Reports Dg Chest 2 View  Result Date: 04/05/2018 CLINICAL DATA:  Chest pain EXAM: CHEST - 2 VIEW COMPARISON:  March 04, 2018 FINDINGS: Nonacute proximal right humeral fracture. The heart, hila, mediastinum, lungs, and pleura are otherwise normal. IMPRESSION: Nonacute proximal right humeral fracture.  No acute abnormality. Electronically Signed   By: Gerome Sam III M.D   On: 04/05/2018 18:17   Dg Abd 1 View  Result Date: 04/06/2018 CLINICAL DATA:  Orogastric tube placement. EXAM: ABDOMEN - 1 VIEW COMPARISON:  Radiograph of March 06, 2018. FINDINGS: The bowel gas pattern is normal. No radio-opaque calculi or other significant radiographic abnormality are seen. Distal tip of nasogastric tube is seen in proximal stomach. IMPRESSION: No evidence of bowel obstruction or ileus. Distal tip of nasogastric tube is seen in proximal stomach. Electronically Signed   By: Lupita Raider, M.D.   On: 04/06/2018 20:32   Ct Abdomen Pelvis W Contrast  Result Date: 04/05/2018 CLINICAL DATA:  Left upper quadrant abdominal pain with nausea and vomiting. Recent partial colectomy and colostomy on 02/25/2018 for perforated sigmoid diverticulitis. EXAM: CT ABDOMEN AND PELVIS WITH CONTRAST TECHNIQUE: Multidetector CT imaging of the abdomen and pelvis was performed using the standard protocol following bolus administration of intravenous contrast. CONTRAST:  75mL OMNIPAQUE IOHEXOL 300 MG/ML  SOLN COMPARISON:  CT abdomen pelvis dated Feb 25, 2018. FINDINGS: Lower chest: No acute abnormality. Hepatobiliary: Diffuse hepatic steatosis. The gallbladder remains mildly distended. No gallbladder wall thickening or gallstones. Unchanged mild dilatation of the common bile duct, measuring up to 10 mm. Pancreas: Unremarkable. No pancreatic ductal dilatation or surrounding inflammatory changes. Spleen: Normal in size without focal abnormality. Adrenals/Urinary Tract: Adrenal glands are unremarkable. Kidneys  are normal, without renal calculi, focal lesion, or hydronephrosis. Bladder is unremarkable. Stomach/Bowel: Postsurgical changes related to sigmoid colectomy with left lower quadrant colostomy. There is moderate circumferential wall thickening of the proximal descending colon extending to the ostomy. The appendix is surgically absent. Improving but persistent wall thickening along the antrum of the stomach and first portion of the duodenum. Fecalization of distal small bowel contents likely represents delayed transit. No bowel obstruction. Vascular/Lymphatic: Aortic atherosclerosis. No enlarged abdominal or pelvic lymph nodes. Reproductive: Status post hysterectomy. No adnexal masses. Other: 2.0 x 2.5 cm rounded area of inflammatory fat stranding in the right mid abdomen. No free fluid or pneumoperitoneum. Mild presacral inflammatory stranding. Open midline abdominal surgical incision. Musculoskeletal: No acute or significant osseous findings. IMPRESSION: 1. Postsurgical changes related to sigmoid colectomy with left lower quadrant colostomy. Moderate circumferential wall thickening of the proximal descending colon extending to the ostomy, consistent with colitis. 2. Improving but persistent wall thickening along the antrum of the stomach and first portion of the duodenum with adjacent prominent lymph nodes which are slightly increased in size, now measuring up to 8 mm in short axis. Correlation with endoscopy is recommended if not previously performed. 3. Unchanged mild dilatation of the common bile duct and gallbladder distention. Correlate with LFTs and consider further evaluation with ERCP or MRCP as clinically indicated. 4. New 2.0 x 2.5 cm rounded area of inflammatory fat stranding in the right mid abdomen may represent omental infarct. Electronically Signed   By: Obie Dredge M.D.   On: 04/05/2018 21:32   Dg Chest Port 1 View  Result Date: 04/07/2018 CLINICAL DATA:  Encounter for central line placement  EXAM: PORTABLE CHEST 1 VIEW COMPARISON:  Earlier today FINDINGS: New left IJ line with [facility]  [facility]. No pneumothorax or mediastinal widening. Endotracheal tube tip between the clavicular heads and carina. An orogastric tube tip is at the gastric fundus. The lungs remain clear. Normal heart size IMPRESSION: 1. No adverse finding after left IJ line placement. 2. No evidence of active cardiopulmonary disease. Electronically Signed   By: Marnee SpringJonathon  Watts M.D.   On: 04/07/2018 00:14   Dg Chest Port 1 View  Result Date: 04/06/2018 CLINICAL DATA:  Endotracheal tube placement. EXAM: PORTABLE CHEST 1 VIEW COMPARISON:  Radiographs of April 05, 2018. FINDINGS: The heart size and mediastinal contours are within normal limits. Endotracheal tube is seen projected over tracheal air shadow with distal tip 2 cm above the carina. Nasogastric tube tip is seen in proximal stomach. There is no evidence of bowel obstruction or ileus. Both lungs are clear. Stable old proximal right humeral fracture is noted. IMPRESSION: Endotracheal and nasogastric tubes are in grossly good position. No acute pulmonary abnormality is noted. Electronically Signed   By: Lupita RaiderJames  Green Jr, M.D.   On: 04/06/2018 20:31   [place] Ekg Site Rite  Result Date: 04/07/2018 If Site Rite image not attached, placement could not be confirmed due to current cardiac rhythm.    CBC Recent Labs  Lab 04/06/18 1926  04/08/18 0232 04/08/18 0554 04/09/18 0501 04/10/18 0544 04/11/18 0529  WBC 6.9  --   --  5.7 6.7 6.5 5.8  HGB 5.8*   < > 12.5 12.4 12.2 12.6 13.2  HCT 16.8*   < > 36.0 36.0 34.7* 35.3 37.4  PLT 335  --   --  156 141* 154 181  MCV 103.9*  --   --  93.4 93.6 93.9 94.1  MCH 36.1*  --   --  32.2 33.0 33.4 33.2  MCHC 34.7  --   --  34.5 35.3 35.6 35.3  RDW 22.1*  --   --  15.8* 15.6* 16.1* 15.6*  LYMPHSABS  --   --   --  1.3  --  1.2 1.0  MONOABS  --   --   --  0.4  --  0.5 0.6  EOSABS  --   --   --  0.5  --  0.6 0.5  BASOSABS  --   --   --  0.0   --  0.1 0.0   < > = values in this interval not displayed.    Chemistries  Recent Labs  Lab 04/05/18 1909  04/06/18 1926  04/08/18 0554  04/09/18 0501 04/10/18 0544 04/10/18 1751 04/11/18 0529 04/12/18 0455  NA  --    < > 140   < > 143  --  141 136  --  136 136  K  --    < > 3.6   < > 3.5   < > 3.6 3.8 4.4 3.8 3.9  CL  --    < > 111   < > 117*  --  112* 107  --  106 105  CO2  --    < > 23   < > 24  --  25 27  --  25 25  GLUCOSE  --    < > 116*   < > 97  --  104* 102*  --  112* 88  BUN  --    < > 22   < > 16  --  12 10  --  7* 6*  CREATININE  --    < > 1.31*   < >  0.81  --  0.61 0.53  --  0.42* 0.40*  CALCIUM  --    < > 7.1*   < > 6.7*  --  6.5* 6.4*  --  6.7* 7.3*  MG  --   --   --    < > 2.1  --  1.8 1.5* 1.9 1.8 1.5*  AST 32  --  24  --  15  --   --   --   --  21  --   ALT 24  --  15  --  13  --   --   --   --  13  --   ALKPHOS 114  --  74  --  63  --   --   --   --  79  --   BILITOT 0.6  --  0.5  --  0.6  --   --   --   --  0.5  --    < > = values in this interval not displayed.   ------------------------------------------------------------------------------------------------------------------ estimated creatinine clearance is 58.4 mL/min (A) (by C-G formula based on SCr of 0.4 mg/dL (L)). ------------------------------------------------------------------------------------------------------------------ No results for input(s): HGBA1C in the last 72 hours. ------------------------------------------------------------------------------------------------------------------ Recent Labs    04/11/18 0529  TRIG 144   ------------------------------------------------------------------------------------------------------------------ No results for input(s): TSH, T4TOTAL, T3FREE, THYROIDAB in the last 72 hours.  Invalid input(s): FREET3 ------------------------------------------------------------------------------------------------------------------ No results for input(s):  VITAMINB12, FOLATE, FERRITIN, TIBC, IRON, RETICCTPCT in the last 72 hours.  Coagulation profile Recent Labs  Lab 04/06/18 1925  INR 1.30    No results for input(s): DDIMER in the last 72 hours.  Cardiac Enzymes Recent Labs  Lab 04/05/18 1908 04/05/18 2211  TROPONINI 0.03* 0.03*   ------------------------------------------------------------------------------------------------------------------ Invalid input(s): POCBNP    Assessment & Plan   1. upper GI bleed with a large duodenal ulcer Status post vascular embolization Hemoglobin stable Hemoglobin stable Continue protonix Advance diet  2.  Acute respiratory failure continue ventilator support Resolved   3.  Possible.  Acute colitis with antibiotics discontinued by the intensivist Continue current diet  4.  Acute UTI.    Patient's antibiotics discontinued treated  5.  Severe anemia, likely multifactorial, related to chronic anemia, worsened by acute blood loss transfuse as needed hemoglobin now normal  6.  Acute renal failure, likely prerenal.    Continue IV fluids follow renal function  7.  Tobacco abuse.  Nicotine patch   8.  Elevated blood pressure use IV hydralazine PRN  9.  Electrolyte imbalances replace electrolyte pharmacy  10.  Generalized weakness patient will need rehab likely discharge tomorrow     Code Status Orders  (From admission, onward)        Start     Ordered   04/06/18 0229  Full code  Continuous     04/06/18 0229    Code Status History    Date Active Date Inactive Code Status Order ID Comments User Context   02/25/2018 1326 03/12/2018 1730 Full Code [id number]  Star Age, DO Inpatient    Advance Directive Documentation     Most Recent Value  Type of Advance Directive  Healthcare Power of Attorney, Living will  Pre-existing out of facility DNR order (yellow form or pink MOST form)  -  "MOST" Form in Place?  -           Consults GI ,SURGERY   DVT Prophylaxis   SCD'S  Lab Results  Component Value Date  PLT 181 04/11/2018     Time Spent in minutes    Greater than 50% of time spent in care coordination and counseling patient regarding the condition and plan of care.   Auburn Bilberry M.D on 04/12/2018 at 2:22 PM  Between 7am to 6pm - Pager - [phone]  After 6pm go to [email or web address] - Social research officer, government  [employer]   Office  [phone]

## 2018-04-12 NOTE — Clinical Social Work Note (Signed)
Clinical Social Work Assessment  Patient Details  Name: Annette Miller MRN: [id number] Date of Birth: 1951-08-06  Date of referral:  04/12/18               Reason for consult:  Discharge Planning                Permission sought to share information with:  Facility Art therapist granted to share information::  Yes, Verbal Permission Granted  Name::        Agency::     Relationship::     Contact Information:     Housing/Transportation Living arrangements for the past 2 months:  Single Family Home Source of Information:  Patient Patient Interpreter Needed:  None Criminal Activity/Legal Involvement Pertinent to Current Situation/Hospitalization:  No - Comment as needed Significant Relationships:    Lives with:    Do you feel safe going back to the place where you live?  Yes Need for family participation in patient care:  Yes (Comment)  Care giving concerns:  Patient resides at home.   Social Worker assessment / plan:  CSW noted that PT has recommended short term rehab. CSW met with patient who states that she is in agreement with PT. CSW explained the placement process and that authorization will have to be obtained by medicare [employer].  Employment status:  Retired Nurse, adult PT Recommendations:  [place] / Referral to community resources:     Patient/Family's Response to care: Patient expressed appreciation for CSW assistance.   Patient/Family's Understanding of and Emotional Response to Diagnosis, Current Treatment, and Prognosis:  Patient is hopeful that she will progress with rehab.  Emotional Assessment Appearance:  Appears stated age Attitude/Demeanor/Rapport:  (pleasant and cooperate) Affect (typically observed):  Accepting, Calm Orientation:  Oriented to Self, Oriented to Place, Oriented to  Time, Oriented to Situation Alcohol / Substance use:  Not Applicable Psych involvement (Current and  /or in the community):  No (Comment)  Discharge Needs  Concerns to be addressed:  Care Coordination Readmission within the last 30 days:  No Current discharge risk:  None Barriers to Discharge:  No Barriers Identified   Shela Leff, LCSW 04/12/2018, 11:44 AM

## 2018-04-13 LAB — GLUCOSE, CAPILLARY
GLUCOSE-CAPILLARY: 88 mg/dL (ref 70–99)
GLUCOSE-CAPILLARY: 80 mg/dL (ref 70–99)
GLUCOSE-CAPILLARY: 102 mg/dL — AB (ref 70–99)
Glucose-Capillary: 101 mg/dL — ABNORMAL HIGH (ref 70–99)

## 2018-04-13 LAB — BASIC METABOLIC PANEL
ANION GAP: 3 — AB (ref 5–15)
BUN: 6 mg/dL — ABNORMAL LOW (ref 8–23)
CALCIUM: 7.3 mg/dL — AB (ref 8.9–10.3)
CO2: 27 mmol/L (ref 22–32)
CREATININE: 0.49 mg/dL (ref 0.44–1.00)
Chloride: 106 mmol/L (ref 98–111)
Glucose, Bld: 89 mg/dL (ref 70–99)
Potassium: 3.6 mmol/L (ref 3.5–5.1)
Sodium: 136 mmol/L (ref 135–145)

## 2018-04-13 LAB — MAGNESIUM: Magnesium: 1.8 mg/dL (ref 1.7–2.4)

## 2018-04-13 LAB — PHOSPHORUS: Phosphorus: 2.9 mg/dL (ref 2.5–4.6)

## 2018-04-13 MED ORDER — DIAZEPAM 5 MG PO TABS: 5.0000 mg | ORAL_TABLET | Freq: Two times a day (BID) | ORAL | 0 refills | Status: DC

## 2018-04-13 MED ORDER — SUCRALFATE 1 G PO TABS: 1.0000 g | ORAL_TABLET | Freq: Three times a day (TID) | ORAL | Status: DC

## 2018-04-13 MED ORDER — SIMETHICONE 80 MG PO CHEW: 80.0000 mg | CHEWABLE_TABLET | Freq: Three times a day (TID) | ORAL | 2 refills | Status: DC

## 2018-04-13 MED ORDER — DIPHENOXYLATE-ATROPINE 2.5-0.025 MG PO TABS: 1.0000 | ORAL_TABLET | Freq: Four times a day (QID) | ORAL | 0 refills | Status: DC | PRN

## 2018-04-13 MED ORDER — OXYCODONE HCL 5 MG PO TABS: 5.0000 mg | ORAL_TABLET | ORAL | 0 refills | Status: DC | PRN

## 2018-04-13 MED ORDER — ADULT MULTIVITAMIN W/MINERALS CH: 1.0000 | ORAL_TABLET | Freq: Every day | ORAL | Status: AC

## 2018-04-13 NOTE — Progress Notes (Signed)
PT Cancellation Note  Patient Details Name: Annette Miller MRN: [id number] DOB: [birth year]   Cancelled Treatment:    Reason Eval/Treat Not Completed: Patient declined, no reason specified. Treatment attempted and encouraged, but pt declines stating she does not wish to do any therapy here. "I'm going to be lazy". "I am going to be discharged today, and want to do therapy at rehab." No current discharge at this time, which was explained to pt. Re attempt at a later time if pt remains admitted.    Scot DockHeidi E Brysin Towery, PTA 04/13/2018, 12:20 PM

## 2018-04-13 NOTE — Care Management Important Message (Signed)
Copy of signed IM left with patient in room.  

## 2018-04-13 NOTE — Progress Notes (Signed)
PHARMACY - CONSULT NOTE   Pharmacy Consult for Electrolyte Management Indication: refeeding syndrome   Patient Measurements: Height: 5\' 2"  (157.5 cm) Weight: 133 lb (60.3 kg) IBW/kg (Calculated) : 50.1 TPN AdjBW (KG): 44.8 Body mass index is 24.33 kg/m.  Sodium (mmol/L)  Date Value  04/13/2018 136  05/20/2017 139   Potassium (mmol/L)  Date Value  04/13/2018 3.6   Magnesium (mg/dL)  Date Value  96/04/540907/17/2019 1.8   Phosphorus (mg/dL)  Date Value  81/19/147807/17/2019 2.9   Calcium (mg/dL)  Date Value  29/56/213007/17/2019 7.3 (L)   Albumin (g/dL)  Date Value  86/57/846907/15/2019 1.7 (L)  05/20/2017 3.9  CorrCa 9.1  CBG (last 3)  Recent Labs    04/12/18 1643 04/12/18 2155 04/13/18 0748  GLUCAP 97 97 88     Pharmacy consulted for electrolyte management for 67 yo female admitted with upper GI bleed. Patient with recent history significant for partial colectomy and ostomy for perforated sigmoid diverticulitis. Patient with embolization on 7/11. Patient was on TPN from 7/11 to 7/14.  Assessment/Plan:  K 3.6, Mag 1.8, Phos 2.9, Scr 0.49  Ca 7.3 Albumin 1.7, CorrCa 9.1 No supplementation needed at this time.  Will recheck electrolytes in AM.   Crist FatHannah Rodneisha Bonnet, PharmD, BCPS Clinical Pharmacist 04/13/2018 10:58 AM

## 2018-04-13 NOTE — Care Management (Signed)
Patient to discharge to SNF.  CSW facilitating.  [name] with [facility] notified.

## 2018-04-13 NOTE — Discharge Summary (Signed)
[facility] - [place] at [facility], 67 y.o., DOB 26-Sep-1951, MRN [id number]. Admission date: 04/05/2018 Discharge Date 04/13/2018 Primary MD Steele Sizer, MD Admitting Physician Cammy Copa, MD  Admission Diagnosis  Colitis [K52.9] Symptomatic anemia [D64.9] Sepsis, due to unspecified organism [facility]) [A41.9]  Discharge Diagnosis   Active Problems: Upper GI bleed with large duodenal ulcer status post vascular embolization Acute blood loss anemia status post transfusion Acute hypoxic respiratory failure requiring vent support Possible acute colitis UTI Severe anemia Acute renal failure resolved Tobacco abuse Electrolyte imbalances  Hospital Course  Annette Miller  is a 67 y.o. female with a known history of hyperlipidemia, chronic anemia, osteoporosis and hypothyroidism.  Patient is status post recent partial colectomy and ostomy for perforated sigmoid diverticulitis.  Who presented to the ER complaining of chest pain nausea throwing up and weakness.  Patient also was noted to have severe anemia.  Therefore she was admitted and started on blood transfusion.  Patient had a CT scan was suggestive of colitis.  Patient was noted to have a GI bleed as well.  She was seen by GI and underwent EGD which showed a large duodenal ulcer.  That they were not able to cauterize or clip.  It was actively bleeding.  Patient had to have vascular embolization for this.  Patient developed decrease in lethargy and had to be transferred to ICU prior to her vascular procedure.  She was actively bleeding then.  Patient had to be intubated subsequently extubated next day.  Patient now is doing better no further bleeding noted.  She is doing better however very weak can need to be discharged to rehab.               Consults  GI,surgery, intesivist  Significant Tests:  See full reports for all details     Dg Chest 2 View  Result Date: 04/05/2018 CLINICAL DATA:  Chest  pain EXAM: CHEST - 2 VIEW COMPARISON:  March 04, 2018 FINDINGS: Nonacute proximal right humeral fracture. The heart, hila, mediastinum, lungs, and pleura are otherwise normal. IMPRESSION: Nonacute proximal right humeral fracture.  No acute abnormality. Electronically Signed   By: Gerome Sam III M.D   On: 04/05/2018 18:17   Dg Abd 1 View  Result Date: 04/06/2018 CLINICAL DATA:  Orogastric tube placement. EXAM: ABDOMEN - 1 VIEW COMPARISON:  Radiograph of March 06, 2018. FINDINGS: The bowel gas pattern is normal. No radio-opaque calculi or other significant radiographic abnormality are seen. Distal tip of nasogastric tube is seen in proximal stomach. IMPRESSION: No evidence of bowel obstruction or ileus. Distal tip of nasogastric tube is seen in proximal stomach. Electronically Signed   By: Lupita Raider, M.D.   On: 04/06/2018 20:32   Ct Abdomen Pelvis W Contrast  Result Date: 04/05/2018 CLINICAL DATA:  Left upper quadrant abdominal pain with nausea and vomiting. Recent partial colectomy and colostomy on 02/25/2018 for perforated sigmoid diverticulitis. EXAM: CT ABDOMEN AND PELVIS WITH CONTRAST TECHNIQUE: Multidetector CT imaging of the abdomen and pelvis was performed using the standard protocol following bolus administration of intravenous contrast. CONTRAST:  75mL OMNIPAQUE IOHEXOL 300 MG/ML  SOLN COMPARISON:  CT abdomen pelvis dated Feb 25, 2018. FINDINGS: Lower chest: No acute abnormality. Hepatobiliary: Diffuse hepatic steatosis. The gallbladder remains mildly distended. No gallbladder wall thickening or gallstones. Unchanged mild dilatation of the common bile duct, measuring up to 10 mm. Pancreas: Unremarkable. No pancreatic ductal dilatation or surrounding inflammatory changes. Spleen: Normal in size  without focal abnormality. Adrenals/Urinary Tract: Adrenal glands are unremarkable. Kidneys are normal, without renal calculi, focal lesion, or hydronephrosis. Bladder is unremarkable. Stomach/Bowel:  Postsurgical changes related to sigmoid colectomy with left lower quadrant colostomy. There is moderate circumferential wall thickening of the proximal descending colon extending to the ostomy. The appendix is surgically absent. Improving but persistent wall thickening along the antrum of the stomach and first portion of the duodenum. Fecalization of distal small bowel contents likely represents delayed transit. No bowel obstruction. Vascular/Lymphatic: Aortic atherosclerosis. No enlarged abdominal or pelvic lymph nodes. Reproductive: Status post hysterectomy. No adnexal masses. Other: 2.0 x 2.5 cm rounded area of inflammatory fat stranding in the right mid abdomen. No free fluid or pneumoperitoneum. Mild presacral inflammatory stranding. Open midline abdominal surgical incision. Musculoskeletal: No acute or significant osseous findings. IMPRESSION: 1. Postsurgical changes related to sigmoid colectomy with left lower quadrant colostomy. Moderate circumferential wall thickening of the proximal descending colon extending to the ostomy, consistent with colitis. 2. Improving but persistent wall thickening along the antrum of the stomach and first portion of the duodenum with adjacent prominent lymph nodes which are slightly increased in size, now measuring up to 8 mm in short axis. Correlation with endoscopy is recommended if not previously performed. 3. Unchanged mild dilatation of the common bile duct and gallbladder distention. Correlate with LFTs and consider further evaluation with ERCP or MRCP as clinically indicated. 4. New 2.0 x 2.5 cm rounded area of inflammatory fat stranding in the right mid abdomen may represent omental infarct. Electronically Signed   By: Obie Dredge M.D.   On: 04/05/2018 21:32   Dg Chest Port 1 View  Result Date: 04/07/2018 CLINICAL DATA:  Encounter for central line placement EXAM: PORTABLE CHEST 1 VIEW COMPARISON:  Earlier today FINDINGS: New left IJ line with tip at the SVC. No  pneumothorax or mediastinal widening. Endotracheal tube tip between the clavicular heads and carina. An orogastric tube tip is at the gastric fundus. The lungs remain clear. Normal heart size IMPRESSION: 1. No adverse finding after left IJ line placement. 2. No evidence of active cardiopulmonary disease. Electronically Signed   By: Marnee Spring M.D.   On: 04/07/2018 00:14   Dg Chest Port 1 View  Result Date: 04/06/2018 CLINICAL DATA:  Endotracheal tube placement. EXAM: PORTABLE CHEST 1 VIEW COMPARISON:  Radiographs of April 05, 2018. FINDINGS: The heart size and mediastinal contours are within normal limits. Endotracheal tube is seen projected over tracheal air shadow with distal tip 2 cm above the carina. Nasogastric tube tip is seen in proximal stomach. There is no evidence of bowel obstruction or ileus. Both lungs are clear. Stable old proximal right humeral fracture is noted. IMPRESSION: Endotracheal and nasogastric tubes are in grossly good position. No acute pulmonary abnormality is noted. Electronically Signed   By: Lupita Raider, M.D.   On: 04/06/2018 20:31   [place] Ekg Site Rite  Result Date: 04/07/2018 If Site Rite image not attached, placement could not be confirmed due to current cardiac rhythm.      Today   Subjective:   Annette Miller patient doing better denies any complaint Objective:   Blood pressure 139/71, pulse 90, temperature 99.3 F (37.4 C), temperature source Oral, resp. rate 16, height 5\' 2"  (1.575 m), weight 60.3 kg (133 lb), SpO2 98 %.  .  Intake/Output Summary (Last 24 hours) at 04/13/2018 1253 Last data filed at 04/13/2018 1144 Gross per 24 hour  Intake 244 ml  Output 1700 ml  Net -1456 ml    Exam VITAL SIGNS: Blood pressure 139/71, pulse 90, temperature 99.3 F (37.4 C), temperature source Oral, resp. rate 16, height 5\' 2"  (1.575 m), weight 60.3 kg (133 lb), SpO2 98 %.  GENERAL:  67 y.o.-year-old patient lying in the bed with no acute distress.  EYES:  Pupils equal, round, reactive to light and accommodation. No scleral icterus. Extraocular muscles intact.  HEENT: Head atraumatic, normocephalic. Oropharynx and nasopharynx clear.  NECK:  Supple, no jugular venous distention. No thyroid enlargement, no tenderness.  LUNGS: Normal breath sounds bilaterally, no wheezing, rales,rhonchi or crepitation. No use of accessory muscles of respiration.  CARDIOVASCULAR: S1, S2 normal. No murmurs, rubs, or gallops.  ABDOMEN: Soft, nontender, nondistended. Bowel sounds present. No organomegaly or mass.  Colostomy bag with stool EXTREMITIES: No pedal edema, cyanosis, or clubbing.  NEUROLOGIC: Cranial nerves II through XII are intact. Muscle strength 5/5 in all extremities. Sensation intact. Gait not checked.  PSYCHIATRIC: The patient is alert and oriented x 3.  SKIN: No obvious rash, lesion, or ulcer.   Data Review     CBC w Diff:  Lab Results  Component Value Date   WBC 5.8 04/11/2018   HGB 13.2 04/11/2018   HGB 10.7 (L) 05/20/2017   HCT 37.4 04/11/2018   HCT 33.3 (L) 05/20/2017   PLT 181 04/11/2018   PLT 509 (H) 05/20/2017   LYMPHOPCT 18 04/11/2018   MONOPCT 11 04/11/2018   EOSPCT 9 04/11/2018   BASOPCT 1 04/11/2018   CMP:  Lab Results  Component Value Date   NA 136 04/13/2018   NA 139 05/20/2017   K 3.6 04/13/2018   CL 106 04/13/2018   CO2 27 04/13/2018   BUN 6 (L) 04/13/2018   BUN 14 05/20/2017   CREATININE 0.49 04/13/2018   PROT 4.8 (L) 04/11/2018   PROT 7.3 05/20/2017   ALBUMIN 1.7 (L) 04/11/2018   ALBUMIN 3.9 05/20/2017   BILITOT 0.5 04/11/2018   BILITOT <0.2 05/20/2017   ALKPHOS 79 04/11/2018   AST 21 04/11/2018   AST 19 11/12/2016   ALT 13 04/11/2018   ALT 15 11/12/2016  .  Micro Results Recent Results (from the past 240 hour(s))  Blood Culture (routine x 2)     Status: None   Collection Time: 04/05/18  7:08 PM  Result Value Ref Range Status   Specimen Description BLOOD BLOOD RIGHT WRIST  Final   Special Requests    Final    BOTTLES DRAWN AEROBIC AND ANAEROBIC Blood Culture adequate volume   Culture   Final    NO GROWTH 5 DAYS Performed at [facility], [address] [place], [place] [address]    Report Status 04/10/2018 FINAL  Final  Blood Culture (routine x 2)     Status: None   Collection Time: 04/05/18  7:09 PM  Result Value Ref Range Status   Specimen Description BLOOD LEFT ANTECUBITAL  Final   Special Requests   Final    BOTTLES DRAWN AEROBIC AND ANAEROBIC Blood Culture results may not be optimal due to an inadequate volume of blood received in culture bottles   Culture   Final    NO GROWTH 5 DAYS Performed at [facility], [address], [place], [place] [address]    Report Status 04/10/2018 FINAL  Final  MRSA PCR Screening     Status: None   Collection Time: 04/06/18  8:09 PM  Result Value Ref Range Status   MRSA by PCR NEGATIVE NEGATIVE  Final    Comment:        The GeneXpert MRSA Assay (FDA approved for NASAL specimens only), is one component of a comprehensive MRSA colonization surveillance program. It is not intended to diagnose MRSA infection nor to guide or monitor treatment for MRSA infections. Performed at [facility], [address] [place], [place] [address]         Code Status Orders  (From admission, onward)        Start     Ordered   04/06/18 0229  Full code  Continuous     04/06/18 0229    Code Status History    Date Active Date Inactive Code Status Order ID Comments User Context   02/25/2018 1326 03/12/2018 1730 Full Code [id number]  Star AgeKaplin, Aviva W, DO Inpatient    Advance Directive Documentation     Most Recent Value  Type of Advance Directive  Healthcare Power of Attorney, Living will  Pre-existing out of facility DNR order (yellow form or pink MOST form)  -  "MOST" Form in Place?  -           Contact information for follow-up providers    Steele Sizerrissman, Mark A, MD Follow up in 7 day(s).   Specialty:  Family  Medicine Contact information: [address] [place] [address] [phone]            Contact information for after-discharge care    Destination    [facility] .   Service:  Skilled Nursing Contact information: [address] [place] [id number] [phone]                  Discharge Medications   Allergies as of 04/13/2018      Reactions   Codeine Diarrhea, Nausea And Vomiting   Prednisone Other (See Comments)   Reaction: violence   Tape Rash   Transpore tape: redness, irritation at site       Medication List    STOP taking these medications   buPROPion 150 MG 12 hr tablet Commonly known as:  WELLBUTRIN SR   fluticasone 50 MCG/ACT nasal spray Commonly known as:  FLONASE   meloxicam 15 MG tablet Commonly known as:  MOBIC   ondansetron 4 MG disintegrating tablet Commonly known as:  ZOFRAN ODT   potassium chloride 10 MEQ tablet Commonly known as:  K-DUR     TAKE these medications   acetaminophen 325 MG tablet Commonly known as:  TYLENOL Take 650 mg by mouth every 6 (six) hours as needed for mild pain or fever.   diazepam 5 MG tablet Commonly known as:  VALIUM Take 1 tablet (5 mg total) by mouth 2 (two) times daily.   diphenoxylate-atropine 2.5-0.025 MG tablet Commonly known as:  LOMOTIL Take 1 tablet by mouth 4 (four) times daily as needed for diarrhea or loose stools.   levothyroxine 25 MCG tablet Commonly known as:  SYNTHROID, LEVOTHROID Take 1 tablet (25 mcg total) by mouth daily.   loratadine 10 MG tablet Commonly known as:  CLARITIN Take 1 tablet (10 mg total) by mouth daily.   magnesium 30 MG tablet Take 30 mg by mouth daily.   multivitamin with minerals Tabs tablet Take 1 tablet by mouth daily. Start taking on:  04/14/2018   ondansetron 4 MG tablet Commonly known as:  ZOFRAN TAKE 1 TABLET BY MOUTH EVERY 8 HOURS AS NEEDED FOR NAUSEA AND VOMITING   oxyCODONE 5 MG immediate  release  tablet Commonly known as:  Oxy IR/ROXICODONE Take 1 tablet (5 mg total) by mouth every 4 (four) hours as needed for severe pain.   OYSTER SHELL CALCIUM PLUS D PO Take 1 tablet by mouth daily.   pantoprazole 40 MG tablet Commonly known as:  PROTONIX TAKE 1 TABLET BY MOUTH TWICE A DAY   rosuvastatin 10 MG tablet Commonly known as:  CRESTOR Take 1 tablet (10 mg total) by mouth daily.   Selenium 200 MCG Caps Take by mouth daily.   simethicone 80 MG chewable tablet Commonly known as:  GAS-X Chew 1 tablet (80 mg total) by mouth 3 (three) times daily.   sucralfate 1 g tablet Commonly known as:  CARAFATE Take 1 tablet (1 g total) by mouth 4 (four) times daily -  with meals and at bedtime. What changed:  when to take this   vitamin C 1000 MG tablet Take 3,000 mg by mouth daily.   vitamin E 1000 UNIT capsule Take 1,000 Units by mouth 2 (two) times daily.   ZINC 15 PO Take by mouth daily.          Total Time in preparing paper work, data evaluation and todays exam - 35 minutes  Auburn Bilberry M.D on 04/13/2018 at 12:53 PM [facility]   Office  [phone]

## 2018-04-13 NOTE — Progress Notes (Signed)
[employer] - [place] at [facility]                                                                                                                                                                                  Patient Demographics   Annette Miller, is a 67 y.o. female, DOB - 11-Nov-1950, ZOX:[id number]  Admit date - 04/05/2018   Admitting Physician Cammy Copa, MD  Outpatient Primary MD for the patient is Crissman, Redge Gainer, MD   LOS - 7  Subjective: Patient doing better denies any pain waiting for insurance authorization for discharge  Review of Systems:   CONSTITUTIONAL: No documented fever. No fatigue, weakness. No weight gain, no weight loss.  EYES: No blurry or double vision.  ENT: No tinnitus. No postnasal drip. No redness of the oropharynx.  RESPIRATORY: No cough, no wheeze, no hemoptysis. No dyspnea.  CARDIOVASCULAR: No chest pain. No orthopnea. No palpitations. No syncope.  GASTROINTESTINAL: No nausea, no vomiting or diarrhea.  Positive abdominal pain. No melena or hematochezia.  GENITOURINARY:  No urgency. No frequency. No dysuria. No hematuria. No obstructive symptoms. No discharge. No pain. No significant abnormal bleeding ENDOCRINE: No polyuria or nocturia. No heat or cold intolerance.  HEMATOLOGY: No anemia. No bruising. No bleeding. No purpura. No petechiae INTEGUMENTARY: No rashes. No lesions.  MUSCULOSKELETAL: No arthritis. No swelling. No gout.  NEUROLOGIC: No numbness, tingling, or ataxia. No seizure-type activity.  PSYCHIATRIC: No anxiety. No insomnia. No ADD.     Vitals:   Vitals:   04/12/18 1218 04/12/18 2023 04/13/18 0440 04/13/18 1209  BP: (!) 143/75 (!) 147/78 103/90 139/71  Pulse: 84 88 87 90  Resp: 16 20 20 16   Temp: 98.5 F (36.9 C) 98.3 F (36.8 C) 98.2 F (36.8 C) 99.3 F (37.4 C)  TempSrc: Oral Oral Oral Oral  SpO2: 98% 95% 96% 98%  Weight:      Height:        Wt Readings from Last 3 Encounters:  04/12/18 60.3 kg (133 lb)   03/24/18 38.8 kg (85 lb 9.6 oz)  03/01/18 50 kg (110 lb 3.7 oz)     Intake/Output Summary (Last 24 hours) at 04/13/2018 1631 Last data filed at 04/13/2018 1144 Gross per 24 hour  Intake 244 ml  Output 1200 ml  Net -956 ml    Physical Exam:   GENERAL: Critically ill sedated HEAD, EYES, EARS, NOSE AND THROAT: Atraumatic, normocephalic. Pupils equal and reactive to light. Sclerae anicteric. No conjunctival injection. No oro-pharyngeal erythema.  NECK: Supple. There is no jugular venous distention. No bruits, no lymphadenopathy, no thyromegaly.  HEART: Regular rate and rhythm,. No murmurs, no rubs, no clicks.  LUNGS: Clear to auscultation bilaterally. No rales or rhonchi. No wheezes.  ABDOMEN: Soft, flat, nontender, nondistended.  Colostomy bag is in place EXTREMITIES: No evidence of any cyanosis, clubbing, or peripheral edema.  +2 pedal and radial pulses bilaterally.  NEUROLOGIC: Follow commands SKIN: Moist and warm with no rashes appreciated.  Psych: Follow commands LN: No inguinal LN enlargement    Antibiotics   Anti-infectives (From admission, onward)   Start     Dose/Rate Route Frequency Ordered Stop   04/06/18 0515  metroNIDAZOLE (FLAGYL) IVPB 500 mg  Status:  Discontinued     500 mg 100 mL/hr over 60 Minutes Intravenous Every 8 hours 04/06/18 0504 04/07/18 1022   04/06/18 0100  piperacillin-tazobactam (ZOSYN) IVPB 3.375 g  Status:  Discontinued     3.375 g 12.5 mL/hr over 240 Minutes Intravenous Every 8 hours 04/06/18 0045 04/08/18 0725   04/05/18 1845  piperacillin-tazobactam (ZOSYN) IVPB 3.375 g     3.375 g 100 mL/hr over 30 Minutes Intravenous  Once 04/05/18 1840 04/05/18 1955      Medications   Scheduled Meds: . feeding supplement (NEPRO CARB STEADY)  237 mL Oral BID BM  . insulin aspart  0-9 Units Subcutaneous TID AC & HS  . multivitamin with minerals  1 tablet Oral Daily  . multivitamin-lutein  1 capsule Oral Daily  . pantoprazole  40 mg Oral BID  . sodium  chloride flush  10-40 mL Intracatheter Q12H  . sucralfate  1 g Oral TID WC & HS   Continuous Infusions: . potassium PHOSPHATE IVPB (in mmol)     PRN Meds:.acetaminophen **OR** acetaminophen, bisacodyl, diphenoxylate-atropine, fentaNYL (SUBLIMAZE) injection, hydrALAZINE, [DISCONTINUED] ondansetron **OR** ondansetron (ZOFRAN) IV, oxyCODONE, sodium chloride flush   Data Review:   Micro Results Recent Results (from the past 240 hour(s))  Blood Culture (routine x 2)     Status: None   Collection Time: 04/05/18  7:08 PM  Result Value Ref Range Status   Specimen Description BLOOD BLOOD RIGHT WRIST  Final   Special Requests   Final    BOTTLES DRAWN AEROBIC AND ANAEROBIC Blood Culture adequate volume   Culture   Final    NO GROWTH 5 DAYS Performed at [facility], [address] [place], [place] [address]    Report Status 04/10/2018 FINAL  Final  Blood Culture (routine x 2)     Status: None   Collection Time: 04/05/18  7:09 PM  Result Value Ref Range Status   Specimen Description BLOOD LEFT ANTECUBITAL  Final   Special Requests   Final    BOTTLES DRAWN AEROBIC AND ANAEROBIC Blood Culture results may not be optimal due to an inadequate volume of blood received in culture bottles   Culture   Final    NO GROWTH 5 DAYS Performed at [facility], [address] [place], [place] [address]    Report Status 04/10/2018 FINAL  Final  MRSA PCR Screening     Status: None   Collection Time: 04/06/18  8:09 PM  Result Value Ref Range Status   MRSA by PCR NEGATIVE NEGATIVE Final    Comment:        The GeneXpert MRSA Assay (FDA approved for NASAL specimens only), is one component of a comprehensive MRSA colonization surveillance program. It is not intended to diagnose MRSA infection nor to guide or monitor treatment for MRSA infections. Performed at [facility], [address] [place], [place] [address]     Radiology Reports Dg Chest 2  View  Result  Date: 04/05/2018 CLINICAL DATA:  Chest pain EXAM: CHEST - 2 VIEW COMPARISON:  March 04, 2018 FINDINGS: Nonacute proximal right humeral fracture. The heart, hila, mediastinum, lungs, and pleura are otherwise normal. IMPRESSION: Nonacute proximal right humeral fracture.  No acute abnormality. Electronically Signed   By: Gerome Sam III M.D   On: 04/05/2018 18:17   Dg Abd 1 View  Result Date: 04/06/2018 CLINICAL DATA:  Orogastric tube placement. EXAM: ABDOMEN - 1 VIEW COMPARISON:  Radiograph of March 06, 2018. FINDINGS: The bowel gas pattern is normal. No radio-opaque calculi or other significant radiographic abnormality are seen. Distal tip of nasogastric tube is seen in proximal stomach. IMPRESSION: No evidence of bowel obstruction or ileus. Distal tip of nasogastric tube is seen in proximal stomach. Electronically Signed   By: Lupita Raider, M.D.   On: 04/06/2018 20:32   Ct Abdomen Pelvis W Contrast  Result Date: 04/05/2018 CLINICAL DATA:  Left upper quadrant abdominal pain with nausea and vomiting. Recent partial colectomy and colostomy on 02/25/2018 for perforated sigmoid diverticulitis. EXAM: CT ABDOMEN AND PELVIS WITH CONTRAST TECHNIQUE: Multidetector CT imaging of the abdomen and pelvis was performed using the standard protocol following bolus administration of intravenous contrast. CONTRAST:  75mL OMNIPAQUE IOHEXOL 300 MG/ML  SOLN COMPARISON:  CT abdomen pelvis dated Feb 25, 2018. FINDINGS: Lower chest: No acute abnormality. Hepatobiliary: Diffuse hepatic steatosis. The gallbladder remains mildly distended. No gallbladder wall thickening or gallstones. Unchanged mild dilatation of the common bile duct, measuring up to 10 mm. Pancreas: Unremarkable. No pancreatic ductal dilatation or surrounding inflammatory changes. Spleen: Normal in size without focal abnormality. Adrenals/Urinary Tract: Adrenal glands are unremarkable. Kidneys are normal, without renal calculi, focal lesion, or hydronephrosis.  Bladder is unremarkable. Stomach/Bowel: Postsurgical changes related to sigmoid colectomy with left lower quadrant colostomy. There is moderate circumferential wall thickening of the proximal descending colon extending to the ostomy. The appendix is surgically absent. Improving but persistent wall thickening along the antrum of the stomach and first portion of the duodenum. Fecalization of distal small bowel contents likely represents delayed transit. No bowel obstruction. Vascular/Lymphatic: Aortic atherosclerosis. No enlarged abdominal or pelvic lymph nodes. Reproductive: Status post hysterectomy. No adnexal masses. Other: 2.0 x 2.5 cm rounded area of inflammatory fat stranding in the right mid abdomen. No free fluid or pneumoperitoneum. Mild presacral inflammatory stranding. Open midline abdominal surgical incision. Musculoskeletal: No acute or significant osseous findings. IMPRESSION: 1. Postsurgical changes related to sigmoid colectomy with left lower quadrant colostomy. Moderate circumferential wall thickening of the proximal descending colon extending to the ostomy, consistent with colitis. 2. Improving but persistent wall thickening along the antrum of the stomach and first portion of the duodenum with adjacent prominent lymph nodes which are slightly increased in size, now measuring up to 8 mm in short axis. Correlation with endoscopy is recommended if not previously performed. 3. Unchanged mild dilatation of the common bile duct and gallbladder distention. Correlate with LFTs and consider further evaluation with ERCP or MRCP as clinically indicated. 4. New 2.0 x 2.5 cm rounded area of inflammatory fat stranding in the right mid abdomen may represent omental infarct. Electronically Signed   By: Obie Dredge M.D.   On: 04/05/2018 21:32   Dg Chest Port 1 View  Result Date: 04/07/2018 CLINICAL DATA:  Encounter for central line placement EXAM: PORTABLE CHEST 1 VIEW COMPARISON:  Earlier today FINDINGS:  New left IJ line with tip at the SVC. No pneumothorax or mediastinal widening. Endotracheal tube  tip between the clavicular heads and carina. An orogastric tube tip is at the gastric fundus. The lungs remain clear. Normal heart size IMPRESSION: 1. No adverse finding after left IJ line placement. 2. No evidence of active cardiopulmonary disease. Electronically Signed   By: Marnee Spring M.D.   On: 04/07/2018 00:14   Dg Chest Port 1 View  Result Date: 04/06/2018 CLINICAL DATA:  Endotracheal tube placement. EXAM: PORTABLE CHEST 1 VIEW COMPARISON:  Radiographs of April 05, 2018. FINDINGS: The heart size and mediastinal contours are within normal limits. Endotracheal tube is seen projected over tracheal air shadow with distal tip 2 cm above the carina. Nasogastric tube tip is seen in proximal stomach. There is no evidence of bowel obstruction or ileus. Both lungs are clear. Stable old proximal right humeral fracture is noted. IMPRESSION: Endotracheal and nasogastric tubes are in grossly good position. No acute pulmonary abnormality is noted. Electronically Signed   By: Lupita Raider, M.D.   On: 04/06/2018 20:31   [place] Ekg Site Rite  Result Date: 04/07/2018 If Site Rite image not attached, placement could not be confirmed due to current cardiac rhythm.    CBC Recent Labs  Lab 04/06/18 1926  04/08/18 0232 04/08/18 0554 04/09/18 0501 04/10/18 0544 04/11/18 0529  WBC 6.9  --   --  5.7 6.7 6.5 5.8  HGB 5.8*   < > 12.5 12.4 12.2 12.6 13.2  HCT 16.8*   < > 36.0 36.0 34.7* 35.3 37.4  PLT 335  --   --  156 141* 154 181  MCV 103.9*  --   --  93.4 93.6 93.9 94.1  MCH 36.1*  --   --  32.2 33.0 33.4 33.2  MCHC 34.7  --   --  34.5 35.3 35.6 35.3  RDW 22.1*  --   --  15.8* 15.6* 16.1* 15.6*  LYMPHSABS  --   --   --  1.3  --  1.2 1.0  MONOABS  --   --   --  0.4  --  0.5 0.6  EOSABS  --   --   --  0.5  --  0.6 0.5  BASOSABS  --   --   --  0.0  --  0.1 0.0   < > = values in this interval not displayed.     Chemistries  Recent Labs  Lab 04/06/18 1926  04/08/18 0554  04/09/18 0501 04/10/18 0544 04/10/18 1751 04/11/18 0529 04/12/18 0455 04/13/18 0618  NA 140   < > 143  --  141 136  --  136 136 136  K 3.6   < > 3.5   < > 3.6 3.8 4.4 3.8 3.9 3.6  CL 111   < > 117*  --  112* 107  --  106 105 106  CO2 23   < > 24  --  25 27  --  25 25 27   GLUCOSE 116*   < > 97  --  104* 102*  --  112* 88 89  BUN 22   < > 16  --  12 10  --  7* 6* 6*  CREATININE 1.31*   < > 0.81  --  0.61 0.53  --  0.42* 0.40* 0.49  CALCIUM 7.1*   < > 6.7*  --  6.5* 6.4*  --  6.7* 7.3* 7.3*  MG  --    < > 2.1  --  1.8 1.5* 1.9 1.8 1.5* 1.8  AST 24  --  15  --   --   --   --  21  --   --   ALT 15  --  13  --   --   --   --  13  --   --   ALKPHOS 74  --  63  --   --   --   --  79  --   --   BILITOT 0.5  --  0.6  --   --   --   --  0.5  --   --    < > = values in this interval not displayed.   ------------------------------------------------------------------------------------------------------------------ estimated creatinine clearance is 58.4 mL/min (by C-G formula based on SCr of 0.49 mg/dL). ------------------------------------------------------------------------------------------------------------------ No results for input(s): HGBA1C in the last 72 hours. ------------------------------------------------------------------------------------------------------------------ Recent Labs    04/11/18 0529  TRIG 144   ------------------------------------------------------------------------------------------------------------------ No results for input(s): TSH, T4TOTAL, T3FREE, THYROIDAB in the last 72 hours.  Invalid input(s): FREET3 ------------------------------------------------------------------------------------------------------------------ No results for input(s): VITAMINB12, FOLATE, FERRITIN, TIBC, IRON, RETICCTPCT in the last 72 hours.  Coagulation profile Recent Labs  Lab 04/06/18 1925  INR 1.30    No  results for input(s): DDIMER in the last 72 hours.  Cardiac Enzymes No results for input(s): CKMB, TROPONINI, MYOGLOBIN in the last 168 hours.  Invalid input(s): CK ------------------------------------------------------------------------------------------------------------------ Invalid input(s): POCBNP    Assessment & Plan   1. upper GI bleed with a large duodenal ulcer Status post vascular embolization Hemoglobin stable Hemoglobin stable Continue protonix Continue current diet  2.  Acute respiratory failure continue ventilator support Resolved   3.  Possible.  Acute colitis with antibiotics discontinued by the intensivist Continue current diet no further symptom  4.  Acute UTI.    Status post treatment  5.  Severe anemia, likely multifactorial, related to chronic anemia, worsened by acute blood loss transfuse as needed hemoglobin now normal  6.  Acute renal failure, likely prerenal.    Continue IV fluids follow renal function  7.  Tobacco abuse.  Nicotine patch   8.  Elevated blood pressure use IV hydralazine PRN  9.  Electrolyte imbalances replace electrolyte pharmacy  10.  Generalized weakness patient will need rehab likely discharge tomorrow     Code Status Orders  (From admission, onward)        Start     Ordered   04/06/18 0229  Full code  Continuous     04/06/18 0229    Code Status History    Date Active Date Inactive Code Status Order ID Comments User Context   02/25/2018 1326 03/12/2018 1730 Full Code [id number]  Star Age, DO Inpatient    Advance Directive Documentation     Most Recent Value  Type of Advance Directive  Healthcare Power of Attorney, Living will  Pre-existing out of facility DNR order (yellow form or pink MOST form)  -  "MOST" Form in Place?  -           Consults GI ,SURGERY   DVT Prophylaxis  SCD'S  Lab Results  Component Value Date   PLT 181 04/11/2018     Time Spent in minutes    Greater than 50%  of time spent in care coordination and counseling patient regarding the condition and plan of care.   Auburn Bilberry M.D on 04/13/2018 at 4:31 PM  Between 7am to 6pm - Pager - [phone]  After 6pm go to [email or web address] - password [email or web address] [facility]  [employer]  Office  [phone]

## 2018-04-14 ENCOUNTER — Inpatient Hospital Stay: Payer: Self-pay | Admitting: Family Medicine

## 2018-04-14 LAB — BASIC METABOLIC PANEL
Anion gap: 6 (ref 5–15)
BUN: 5 mg/dL — ABNORMAL LOW (ref 8–23)
CHLORIDE: 106 mmol/L (ref 98–111)
CO2: 26 mmol/L (ref 22–32)
Calcium: 7.5 mg/dL — ABNORMAL LOW (ref 8.9–10.3)
Creatinine, Ser: 0.41 mg/dL — ABNORMAL LOW (ref 0.44–1.00)
GFR calc Af Amer: >60 mL/min (ref >60)
GFR calc non Af Amer: >60 mL/min (ref >60)
Glucose, Bld: 92 mg/dL (ref 70–99)
POTASSIUM: 3.3 mmol/L — AB (ref 3.5–5.1)
SODIUM: 138 mmol/L (ref 135–145)

## 2018-04-14 LAB — GLUCOSE, CAPILLARY
GLUCOSE-CAPILLARY: 84 mg/dL (ref 70–99)
Glucose-Capillary: 68 mg/dL — ABNORMAL LOW (ref 70–99)

## 2018-04-14 LAB — MAGNESIUM: MAGNESIUM: 1.6 mg/dL — AB (ref 1.7–2.4)

## 2018-04-14 LAB — PHOSPHORUS: Phosphorus: 2.9 mg/dL (ref 2.5–4.6)

## 2018-04-14 MED ORDER — SIMETHICONE 80 MG PO CHEW: 80.0000 mg | CHEWABLE_TABLET | Freq: Three times a day (TID) | ORAL | 2 refills | Status: DC

## 2018-04-14 MED ORDER — SUCRALFATE 1 G PO TABS: 1.0000 g | ORAL_TABLET | Freq: Three times a day (TID) | ORAL | 0 refills | Status: DC

## 2018-04-14 NOTE — Clinical Social Work Note (Signed)
CSW informed by [employer] that [employer]'s medical director denied authorization for short term rehab. She provided a number for a peer to peer. CSW notified the attending of the opportunity to do the peer to peer but the attending declined. Patient in agreement to return home with home health. RN CM arranging. York SpanielMonica Esley Brooking MSW,LCSW [phone]

## 2018-04-14 NOTE — Progress Notes (Signed)
Pt discharged per MD order. PICC removed. Discharge instructions reviewed with pt. Pt will continue with current home health care. Pt verbalized understanding of instructions. All questions answered to pt satisfaction. Pt taken to car in wheelchair by volunteer.

## 2018-04-14 NOTE — Care Management Note (Signed)
Case Management Note  Patient Details  Name: Shelby Mattocksamela S Mcginness MRN: [id number] Date of Birth: 10/07/1950   Insurance has denied SNF placement.  Patient to return home with resumption of home health services.  [name] with [facility] notified of discharge.  BSC to be delivered to room prior to discharge.   Subjective/Objective:                    Action/Plan:   Expected Discharge Date:  04/14/18               Expected Discharge Plan:  Home w Home Health Services  In-House Referral:     Discharge planning Services  CM Consult  Post Acute Care Choice:  Resumption of Svcs/PTA Provider, Home Health Choice offered to:     DME Arranged:  Bedside commode DME Agency:  [facility]  HH Arranged:  RN, PT, [facility]'s Aide [facility]  Status of Service:  Completed, signed off  If discussed at Long Length of Stay Meetings, dates discussed:    Additional Comments:  Chapman FitchBOWEN, Vyncent Overby T, RN 04/14/2018, 10:15 AM

## 2018-04-14 NOTE — Discharge Summary (Signed)
[facility] - [place] at [facility]  Annette Miller, 67 y.o., DOB Dec 17, 1950, MRN [id number]. Admission date: 04/05/2018 Discharge Date 04/14/2018 Primary MD Steele Sizerrissman, Mark A, MD Admitting Physician Cammy CopaAngela Maier, MD  Admission Diagnosis  Colitis [K52.9] Symptomatic anemia [D64.9] Sepsis, due to unspecified organism [facility](HCC) [A41.9]  Discharge Diagnosis   Active Problems: Upper GI bleed with large duodenal ulcer status post vascular embolization Acute blood loss anemia status post transfusion Acute hypoxic respiratory failure requiring vent support Possible acute colitis UTI Severe anemia Acute renal failure resolved Tobacco abuse Electrolyte imbalances  Hospital Course  Annette Forestamela Jackowski  is a 67 y.o. female with a known history of hyperlipidemia, chronic anemia, osteoporosis and hypothyroidism.  Patient is status post recent partial colectomy and ostomy for perforated sigmoid diverticulitis.  Who presented to the ER complaining of chest pain nausea throwing up and weakness.  Patient also was noted to have severe anemia.  Therefore she was admitted and started on blood transfusion.  Patient had a CT scan was suggestive of colitis.  Patient was noted to have a GI bleed as well.  She was seen by GI and underwent EGD which showed a large duodenal ulcer.  That they were not able to cauterize or clip.  It was actively bleeding.  Patient had to have vascular embolization for this.  Patient developed decrease in lethargy and had to be transferred to ICU prior to her vascular procedure.  She was actively bleeding then.  Patient had to be intubated subsequently extubated next day.  Patient now is doing better no further bleeding noted.  She is doing better however very weak can need to be discharged to rehab.               Consults  GI,surgery, intesivist  Significant Tests:  See full reports for all details     Dg Chest 2 View  Result Date: 04/05/2018 CLINICAL DATA:  Chest  pain EXAM: CHEST - 2 VIEW COMPARISON:  March 04, 2018 FINDINGS: Nonacute proximal right humeral fracture. The heart, hila, mediastinum, lungs, and pleura are otherwise normal. IMPRESSION: Nonacute proximal right humeral fracture.  No acute abnormality. Electronically Signed   By: Gerome Samavid  Williams III M.D   On: 04/05/2018 18:17   Dg Abd 1 View  Result Date: 04/06/2018 CLINICAL DATA:  Orogastric tube placement. EXAM: ABDOMEN - 1 VIEW COMPARISON:  Radiograph of March 06, 2018. FINDINGS: The bowel gas pattern is normal. No radio-opaque calculi or other significant radiographic abnormality are seen. Distal tip of nasogastric tube is seen in proximal stomach. IMPRESSION: No evidence of bowel obstruction or ileus. Distal tip of nasogastric tube is seen in proximal stomach. Electronically Signed   By: Lupita RaiderJames  Green Jr, M.D.   On: 04/06/2018 20:32   Ct Abdomen Pelvis W Contrast  Result Date: 04/05/2018 CLINICAL DATA:  Left upper quadrant abdominal pain with nausea and vomiting. Recent partial colectomy and colostomy on 02/25/2018 for perforated sigmoid diverticulitis. EXAM: CT ABDOMEN AND PELVIS WITH CONTRAST TECHNIQUE: Multidetector CT imaging of the abdomen and pelvis was performed using the standard protocol following bolus administration of intravenous contrast. CONTRAST:  75mL OMNIPAQUE IOHEXOL 300 MG/ML  SOLN COMPARISON:  CT abdomen pelvis dated Feb 25, 2018. FINDINGS: Lower chest: No acute abnormality. Hepatobiliary: Diffuse hepatic steatosis. The gallbladder remains mildly distended. No gallbladder wall thickening or gallstones. Unchanged mild dilatation of the common bile duct, measuring up to 10 mm. Pancreas: Unremarkable. No pancreatic ductal dilatation or surrounding inflammatory changes. Spleen: Normal in size  without focal abnormality. Adrenals/Urinary Tract: Adrenal glands are unremarkable. Kidneys are normal, without renal calculi, focal lesion, or hydronephrosis. Bladder is unremarkable. Stomach/Bowel:  Postsurgical changes related to sigmoid colectomy with left lower quadrant colostomy. There is moderate circumferential wall thickening of the proximal descending colon extending to the ostomy. The appendix is surgically absent. Improving but persistent wall thickening along the antrum of the stomach and first portion of the duodenum. Fecalization of distal small bowel contents likely represents delayed transit. No bowel obstruction. Vascular/Lymphatic: Aortic atherosclerosis. No enlarged abdominal or pelvic lymph nodes. Reproductive: Status post hysterectomy. No adnexal masses. Other: 2.0 x 2.5 cm rounded area of inflammatory fat stranding in the right mid abdomen. No free fluid or pneumoperitoneum. Mild presacral inflammatory stranding. Open midline abdominal surgical incision. Musculoskeletal: No acute or significant osseous findings. IMPRESSION: 1. Postsurgical changes related to sigmoid colectomy with left lower quadrant colostomy. Moderate circumferential wall thickening of the proximal descending colon extending to the ostomy, consistent with colitis. 2. Improving but persistent wall thickening along the antrum of the stomach and first portion of the duodenum with adjacent prominent lymph nodes which are slightly increased in size, now measuring up to 8 mm in short axis. Correlation with endoscopy is recommended if not previously performed. 3. Unchanged mild dilatation of the common bile duct and gallbladder distention. Correlate with LFTs and consider further evaluation with ERCP or MRCP as clinically indicated. 4. New 2.0 x 2.5 cm rounded area of inflammatory fat stranding in the right mid abdomen may represent omental infarct. Electronically Signed   By: Obie Dredge M.D.   On: 04/05/2018 21:32   Dg Chest Port 1 View  Result Date: 04/07/2018 CLINICAL DATA:  Encounter for central line placement EXAM: PORTABLE CHEST 1 VIEW COMPARISON:  Earlier today FINDINGS: New left IJ line with tip at the SVC. No  pneumothorax or mediastinal widening. Endotracheal tube tip between the clavicular heads and carina. An orogastric tube tip is at the gastric fundus. The lungs remain clear. Normal heart size IMPRESSION: 1. No adverse finding after left IJ line placement. 2. No evidence of active cardiopulmonary disease. Electronically Signed   By: Marnee Spring M.D.   On: 04/07/2018 00:14   Dg Chest Port 1 View  Result Date: 04/06/2018 CLINICAL DATA:  Endotracheal tube placement. EXAM: PORTABLE CHEST 1 VIEW COMPARISON:  Radiographs of April 05, 2018. FINDINGS: The heart size and mediastinal contours are within normal limits. Endotracheal tube is seen projected over tracheal air shadow with distal tip 2 cm above the carina. Nasogastric tube tip is seen in proximal stomach. There is no evidence of bowel obstruction or ileus. Both lungs are clear. Stable old proximal right humeral fracture is noted. IMPRESSION: Endotracheal and nasogastric tubes are in grossly good position. No acute pulmonary abnormality is noted. Electronically Signed   By: Lupita Raider, M.D.   On: 04/06/2018 20:31   [place] Ekg Site Rite  Result Date: 04/07/2018 If Site Rite image not attached, placement could not be confirmed due to current cardiac rhythm.      Today   Subjective:   Annette Miller patient doing better denies any complaint Objective:   Blood pressure 131/81, pulse 87, temperature 98.7 F (37.1 C), temperature source Oral, resp. rate 20, height 5\' 2"  (1.575 m), weight 60.3 kg (133 lb), SpO2 97 %.  .  Intake/Output Summary (Last 24 hours) at 04/14/2018 1438 Last data filed at 04/14/2018 1028 Gross per 24 hour  Intake 240 ml  Output 1100 ml  Net -860 ml    Exam VITAL SIGNS: Blood pressure 131/81, pulse 87, temperature 98.7 F (37.1 C), temperature source Oral, resp. rate 20, height 5\' 2"  (1.575 m), weight 60.3 kg (133 lb), SpO2 97 %.  GENERAL:  67 y.o.-year-old patient lying in the bed with no acute distress.  EYES:  Pupils equal, round, reactive to light and accommodation. No scleral icterus. Extraocular muscles intact.  HEENT: Head atraumatic, normocephalic. Oropharynx and nasopharynx clear.  NECK:  Supple, no jugular venous distention. No thyroid enlargement, no tenderness.  LUNGS: Normal breath sounds bilaterally, no wheezing, rales,rhonchi or crepitation. No use of accessory muscles of respiration.  CARDIOVASCULAR: S1, S2 normal. No murmurs, rubs, or gallops.  ABDOMEN: Soft, nontender, nondistended. Bowel sounds present. No organomegaly or mass.  Colostomy bag with stool EXTREMITIES: No pedal edema, cyanosis, or clubbing.  NEUROLOGIC: Cranial nerves II through XII are intact. Muscle strength 5/5 in all extremities. Sensation intact. Gait not checked.  PSYCHIATRIC: The patient is alert and oriented x 3.  SKIN: No obvious rash, lesion, or ulcer.   Data Review     CBC w Diff:  Lab Results  Component Value Date   WBC 5.8 04/11/2018   HGB 13.2 04/11/2018   HGB 10.7 (L) 05/20/2017   HCT 37.4 04/11/2018   HCT 33.3 (L) 05/20/2017   PLT 181 04/11/2018   PLT 509 (H) 05/20/2017   LYMPHOPCT 18 04/11/2018   MONOPCT 11 04/11/2018   EOSPCT 9 04/11/2018   BASOPCT 1 04/11/2018   CMP:  Lab Results  Component Value Date   NA 138 04/14/2018   NA 139 05/20/2017   K 3.3 (L) 04/14/2018   CL 106 04/14/2018   CO2 26 04/14/2018   BUN 5 (L) 04/14/2018   BUN 14 05/20/2017   CREATININE 0.41 (L) 04/14/2018   PROT 4.8 (L) 04/11/2018   PROT 7.3 05/20/2017   ALBUMIN 1.7 (L) 04/11/2018   ALBUMIN 3.9 05/20/2017   BILITOT 0.5 04/11/2018   BILITOT <0.2 05/20/2017   ALKPHOS 79 04/11/2018   AST 21 04/11/2018   AST 19 11/12/2016   ALT 13 04/11/2018   ALT 15 11/12/2016  .  Micro Results Recent Results (from the past 240 hour(s))  Blood Culture (routine x 2)     Status: None   Collection Time: 04/05/18  7:08 PM  Result Value Ref Range Status   Specimen Description BLOOD BLOOD RIGHT WRIST  Final   Special  Requests   Final    BOTTLES DRAWN AEROBIC AND ANAEROBIC Blood Culture adequate volume   Culture   Final    NO GROWTH 5 DAYS Performed at [facility], [address], [place], [place] [address]    Report Status 04/10/2018 FINAL  Final  Blood Culture (routine x 2)     Status: None   Collection Time: 04/05/18  7:09 PM  Result Value Ref Range Status   Specimen Description BLOOD LEFT ANTECUBITAL  Final   Special Requests   Final    BOTTLES DRAWN AEROBIC AND ANAEROBIC Blood Culture results may not be optimal due to an inadequate volume of blood received in culture bottles   Culture   Final    NO GROWTH 5 DAYS Performed at [facility], [address], [place], [place] [address]    Report Status 04/10/2018 FINAL  Final  MRSA PCR Screening     Status: None   Collection Time: 04/06/18  8:09 PM  Result Value Ref Range Status   MRSA by PCR  NEGATIVE NEGATIVE Final    Comment:        The GeneXpert MRSA Assay (FDA approved for NASAL specimens only), is one component of a comprehensive MRSA colonization surveillance program. It is not intended to diagnose MRSA infection nor to guide or monitor treatment for MRSA infections. Performed at [facility], [address] [place], [place] [address]         Code Status Orders  (From admission, onward)        Start     Ordered   04/06/18 0229  Full code  Continuous     04/06/18 0229    Code Status History    Date Active Date Inactive Code Status Order ID Comments User Context   02/25/2018 1326 03/12/2018 1730 Full Code [id number]  Star Age, DO Inpatient    Advance Directive Documentation     Most Recent Value  Type of Advance Directive  Healthcare Power of Attorney, Living will  Pre-existing out of facility DNR order (yellow form or pink MOST form)  -  "MOST" Form in Place?  -           Contact information for follow-up providers    Steele Sizer, MD On 04/14/2018.   Specialty:  Family  Medicine Why:  You already have a follow up on 04/14/18 at 3:30pm. Contact information: [address] [place] [address] [phone]            Contact information for after-discharge care    Destination    [facility] .   Service:  Skilled Nursing Contact information: [address] [place] [address] [phone]                  Discharge Medications   Allergies as of 04/14/2018      Reactions   Codeine Diarrhea, Nausea And Vomiting   Prednisone Other (See Comments)   Reaction: violence   Tape Rash   Transpore tape: redness, irritation at site       Medication List    STOP taking these medications   buPROPion 150 MG 12 hr tablet Commonly known as:  WELLBUTRIN SR   fluticasone 50 MCG/ACT nasal spray Commonly known as:  FLONASE   meloxicam 15 MG tablet Commonly known as:  MOBIC   ondansetron 4 MG disintegrating tablet Commonly known as:  ZOFRAN ODT   potassium chloride 10 MEQ tablet Commonly known as:  K-DUR     TAKE these medications   acetaminophen 325 MG tablet Commonly known as:  TYLENOL Take 650 mg by mouth every 6 (six) hours as needed for mild pain or fever.   diazepam 5 MG tablet Commonly known as:  VALIUM Take 1 tablet (5 mg total) by mouth 2 (two) times daily.   diphenoxylate-atropine 2.5-0.025 MG tablet Commonly known as:  LOMOTIL Take 1 tablet by mouth 4 (four) times daily as needed for diarrhea or loose stools.   levothyroxine 25 MCG tablet Commonly known as:  SYNTHROID, LEVOTHROID Take 1 tablet (25 mcg total) by mouth daily.   loratadine 10 MG tablet Commonly known as:  CLARITIN Take 1 tablet (10 mg total) by mouth daily.   magnesium 30 MG tablet Take 30 mg by mouth daily.   multivitamin with minerals Tabs tablet Take 1 tablet by mouth daily.   ondansetron 4 MG tablet Commonly known as:  ZOFRAN TAKE 1 TABLET BY MOUTH EVERY 8 HOURS AS NEEDED FOR NAUSEA AND VOMITING  oxyCODONE 5 MG immediate release tablet Commonly known as:  Oxy IR/ROXICODONE Take 1 tablet (5 mg total) by mouth every 4 (four) hours as needed for severe pain.   OYSTER SHELL CALCIUM PLUS D PO Take 1 tablet by mouth daily.   pantoprazole 40 MG tablet Commonly known as:  PROTONIX TAKE 1 TABLET BY MOUTH TWICE A DAY   rosuvastatin 10 MG tablet Commonly known as:  CRESTOR Take 1 tablet (10 mg total) by mouth daily.   Selenium 200 MCG Caps Take by mouth daily.   simethicone 80 MG chewable tablet Commonly known as:  GAS-X Chew 1 tablet (80 mg total) by mouth 3 (three) times daily.   sucralfate 1 g tablet Commonly known as:  CARAFATE Take 1 tablet (1 g total) by mouth 4 (four) times daily -  with meals and at bedtime. What changed:  when to take this   vitamin C 1000 MG tablet Take 3,000 mg by mouth daily.   vitamin E 1000 UNIT capsule Take 1,000 Units by mouth 2 (two) times daily.   ZINC 15 PO Take by mouth daily.            Durable Medical Equipment  (From admission, onward)        Start     Ordered   04/14/18 1008  For home use only DME Bedside commode  Once    Question:  Patient needs a bedside commode to treat with the following condition  Answer:  History of unsteady gait   04/14/18 1008         Total Time in preparing paper work, data evaluation and todays exam - 35 minutes  Auburn Bilberry M.D on 04/14/2018 at 2:38 PM [facility]   Office  [phone]

## 2018-04-15 ENCOUNTER — Telehealth: Payer: Self-pay

## 2018-04-15 NOTE — Telephone Encounter (Signed)
Transition Care Management Follow-up Telephone Call  How have you been since you were released from the hospital? "im doing great today"  Do you understand why you were in the hospital? yes  Do you have a copy of your discharge instructions Yes Do you understand the discharge instrcutions? yes  Where were you discharged to? Home  Do you have support at home? Yes    Items Reviewed:  Medications obtained Yes  Medications reviewed: Yes  Dietary changes reviewed: yes  Home Health? Yes, [facility]  DME ordered at discharge obtained? NA  Medical supplies: Wound Care done by home health     Functional Questionnaire:   Activities of Daily Living (ADLs):   She states they are independent in the following: ambulation, bathing and hygiene, feeding, continence, grooming, toileting, dressing and medication management States they require assistance with the following: none  Any transportation issues/concerns?: no  Any patient concerns? no  Confirmed importance and date/time of follow-up visits scheduled with PCP: yes, 04/21/2018 at 3pm with Dr.Crissman.  Confirm appointment scheduled with specialist? Yes  Confirmed with patient if condition begins to worsen call PCP or If it's emergency go to the ER.

## 2018-04-18 ENCOUNTER — Other Ambulatory Visit: Payer: Self-pay | Admitting: Unknown Physician Specialty

## 2018-04-18 ENCOUNTER — Telehealth: Payer: Self-pay | Admitting: Surgery

## 2018-04-18 NOTE — Telephone Encounter (Signed)
Verbal orders provided. Continue wet to dry dressing changes.

## 2018-04-18 NOTE — Telephone Encounter (Signed)
Misty with [facility] in [place] called and left a voicemail on Friday 1:44PM, said she is needing approval for patients wound care. She can be reached at [phone]. Please call and advise.

## 2018-04-20 DIAGNOSIS — Z433 Encounter for attention to colostomy: Secondary | ICD-10-CM | POA: Diagnosis not present

## 2018-04-20 DIAGNOSIS — S31601A Unspecified open wound of abdominal wall, left upper quadrant with penetration into peritoneal cavity, initial encounter: Secondary | ICD-10-CM | POA: Diagnosis not present

## 2018-04-20 NOTE — Telephone Encounter (Signed)
Ondansetron refill Last Refill:02/25/18 # 20 tabs 3 RF Last OV: 12/15/17 PCP: Dr Dossie Arbourrissman [facility] [address]

## 2018-04-21 ENCOUNTER — Ambulatory Visit (INDEPENDENT_AMBULATORY_CARE_PROVIDER_SITE_OTHER): Payer: Medicare HMO | Admitting: Family Medicine

## 2018-04-21 ENCOUNTER — Encounter: Payer: Self-pay | Admitting: Family Medicine

## 2018-04-21 DIAGNOSIS — E43 Unspecified severe protein-calorie malnutrition: Secondary | ICD-10-CM

## 2018-04-21 DIAGNOSIS — K5721 Diverticulitis of large intestine with perforation and abscess with bleeding: Secondary | ICD-10-CM | POA: Diagnosis not present

## 2018-04-21 MED ORDER — ONDANSETRON HCL 4 MG PO TABS: ORAL_TABLET | ORAL | 3 refills | Status: DC

## 2018-04-21 MED ORDER — TRAMADOL HCL 50 MG PO TABS: 50.0000 mg | ORAL_TABLET | Freq: Three times a day (TID) | ORAL | 1 refills | Status: DC | PRN

## 2018-04-21 NOTE — Assessment & Plan Note (Signed)
Gaining wt! 

## 2018-04-21 NOTE — Progress Notes (Signed)
   BP (!) 158/90   Pulse 75   Wt 90 lb (40.8 kg)   SpO2 94%   BMI 16.46 kg/m    Subjective:    Patient ID: Annette Miller, female    DOB: 11-01-1950, 67 y.o.   MRN: [id number]  HPI: Annette Miller is a 67 y.o. female  Chief Complaint  Patient presents with  . Hospitalization Follow-up    x 5 since may   Patient all in all follow-up after very stormy series of hospitalizations and colostomy tube.  Patient finally on the mend and doing somewhat better.  No issues with medications. Has oxycodone which is finished up and does not want any more once a little bit more for pain has taken tramadol in the past which is very effective. Also needs some Zofran for nausea. Otherwise is improved.  Relevant past medical, surgical, family and social history reviewed and updated as indicated. Interim medical history since our last visit reviewed. Allergies and medications reviewed and updated.  Review of Systems  Constitutional: Negative.   Respiratory: Negative.   Cardiovascular: Negative.     Per HPI unless specifically indicated above     Objective:    BP (!) 158/90   Pulse 75   Wt 90 lb (40.8 kg)   SpO2 94%   BMI 16.46 kg/m   Wt Readings from Last 3 Encounters:  04/21/18 90 lb (40.8 kg)  04/12/18 133 lb (60.3 kg)  03/24/18 85 lb 9.6 oz (38.8 kg)    Physical Exam  Constitutional: She is oriented to person, place, and time. She appears well-developed and well-nourished.  HENT:  Head: Normocephalic and atraumatic.  Eyes: Conjunctivae and EOM are normal.  Neck: Normal range of motion.  Cardiovascular: Normal rate, regular rhythm and normal heart sounds.  Pulmonary/Chest: Effort normal and breath sounds normal.  Musculoskeletal: Normal range of motion.  Neurological: She is alert and oriented to person, place, and time.  Skin: No erythema.  Psychiatric: She has a normal mood and affect. Her behavior is normal. Judgment and thought content normal.        Assessment &  Plan:   Problem List Items Addressed This Visit      Digestive   Diverticulitis of colon with perforation    Very stormy hospitalization over diverticulosis with perforation patient with colostomy now and healing has lost greater than 45 pounds during this storm.  Patient eating and recovering better with better nutrition.        Other   Protein-calorie malnutrition, severe    Gaining wt          Follow up plan: Return in about 1 month (around 05/19/2018) for wt check.

## 2018-04-21 NOTE — Assessment & Plan Note (Signed)
Very stormy hospitalization over diverticulosis with perforation patient with colostomy now and healing has lost greater than 45 pounds during this storm.  Patient eating and recovering better with better nutrition.

## 2018-04-22 LAB — CBC WITH DIFFERENTIAL/PLATELET
BASOS ABS: 0 10*3/uL (ref 0.0–0.2)
Basos: 1 %
EOS (ABSOLUTE): 0.3 10*3/uL (ref 0.0–0.4)
Eos: 6 %
Hematocrit: 34 % (ref 34.0–46.6)
Hemoglobin: 11.1 g/dL (ref 11.1–15.9)
IMMATURE GRANS (ABS): 0 10*3/uL (ref 0.0–0.1)
Immature Granulocytes: 0 %
LYMPHS ABS: 1.7 10*3/uL (ref 0.7–3.1)
LYMPHS: 29 %
MCH: 31.6 pg (ref 26.6–33.0)
MCHC: 32.6 g/dL (ref 31.5–35.7)
MCV: 97 fL (ref 79–97)
MONOS ABS: 0.4 10*3/uL (ref 0.1–0.9)
Monocytes: 6 %
Neutrophils Absolute: 3.4 10*3/uL (ref 1.4–7.0)
Neutrophils: 58 %
PLATELETS: 538 10*3/uL — AB (ref 150–450)
RBC: 3.51 x10E6/uL — ABNORMAL LOW (ref 3.77–5.28)
RDW: 16.5 % — AB (ref 12.3–15.4)
WBC: 5.8 10*3/uL (ref 3.4–10.8)

## 2018-04-22 LAB — COMPREHENSIVE METABOLIC PANEL
ALK PHOS: 124 IU/L — AB (ref 39–117)
ALT: 16 IU/L (ref 0–32)
AST: 18 IU/L (ref 0–40)
Albumin/Globulin Ratio: 0.8 — ABNORMAL LOW (ref 1.2–2.2)
Albumin: 2.6 g/dL — ABNORMAL LOW (ref 3.6–4.8)
BUN / CREAT RATIO: 14 (ref 12–28)
BUN: 11 mg/dL (ref 8–27)
Bilirubin Total: 0.3 mg/dL (ref 0.0–1.2)
CALCIUM: 8.4 mg/dL — AB (ref 8.7–10.3)
CO2: 26 mmol/L (ref 20–29)
Chloride: 103 mmol/L (ref 96–106)
Creatinine, Ser: 0.76 mg/dL (ref 0.57–1.00)
GFR calc Af Amer: 94 mL/min/{1.73_m2} (ref >59)
GFR, EST NON AFRICAN AMERICAN: 81 mL/min/{1.73_m2} (ref >59)
GLUCOSE: 115 mg/dL — AB (ref 65–99)
Globulin, Total: 3.2 g/dL (ref 1.5–4.5)
POTASSIUM: 3.7 mmol/L (ref 3.5–5.2)
SODIUM: 142 mmol/L (ref 134–144)
Total Protein: 5.8 g/dL — ABNORMAL LOW (ref 6.0–8.5)

## 2018-04-24 ENCOUNTER — Encounter: Payer: Self-pay | Admitting: Family Medicine

## 2018-04-28 ENCOUNTER — Other Ambulatory Visit: Payer: Self-pay | Admitting: Family Medicine

## 2018-04-28 DIAGNOSIS — F329 Major depressive disorder, single episode, unspecified: Secondary | ICD-10-CM

## 2018-04-28 DIAGNOSIS — F32A Depression, unspecified: Secondary | ICD-10-CM

## 2018-04-28 NOTE — Telephone Encounter (Signed)
Copied from CRM [id number]. Topic: Quick Communication - Rx Refill/Question >> Apr 28, 2018 10:59 AM Oneal GroutSebastian, Jennifer S wrote: Medication: diazepam (VALIUM) 5 MG tablet  Has the patient contacted their pharmacy? Yes.   (Agent: If no, request that the patient contact the pharmacy for the refill.) (Agent: If yes, when and what did the pharmacy advise?)  Preferred Pharmacy (with phone number or street name): [facility]  Agent: Please be advised that RX refills may take up to 3 business days. We ask that you follow-up with your pharmacy.

## 2018-04-28 NOTE — Telephone Encounter (Signed)
LOV 04/21/18 Dr. Dossie Arbourrissman Last refill 04/13/18  # 60 with 0 refill

## 2018-04-29 DIAGNOSIS — Z48815 Encounter for surgical aftercare following surgery on the digestive system: Secondary | ICD-10-CM | POA: Diagnosis not present

## 2018-04-29 DIAGNOSIS — M810 Age-related osteoporosis without current pathological fracture: Secondary | ICD-10-CM | POA: Diagnosis not present

## 2018-04-29 DIAGNOSIS — K219 Gastro-esophageal reflux disease without esophagitis: Secondary | ICD-10-CM | POA: Diagnosis not present

## 2018-04-29 DIAGNOSIS — E039 Hypothyroidism, unspecified: Secondary | ICD-10-CM | POA: Diagnosis not present

## 2018-04-29 DIAGNOSIS — E785 Hyperlipidemia, unspecified: Secondary | ICD-10-CM | POA: Diagnosis not present

## 2018-04-29 DIAGNOSIS — K5720 Diverticulitis of large intestine with perforation and abscess without bleeding: Secondary | ICD-10-CM | POA: Diagnosis not present

## 2018-04-29 DIAGNOSIS — M1990 Unspecified osteoarthritis, unspecified site: Secondary | ICD-10-CM | POA: Diagnosis not present

## 2018-04-29 DIAGNOSIS — D649 Anemia, unspecified: Secondary | ICD-10-CM | POA: Diagnosis not present

## 2018-04-29 DIAGNOSIS — J449 Chronic obstructive pulmonary disease, unspecified: Secondary | ICD-10-CM | POA: Diagnosis not present

## 2018-04-29 DIAGNOSIS — Z433 Encounter for attention to colostomy: Secondary | ICD-10-CM | POA: Diagnosis not present

## 2018-05-04 DIAGNOSIS — Z433 Encounter for attention to colostomy: Secondary | ICD-10-CM | POA: Diagnosis not present

## 2018-05-04 DIAGNOSIS — S31601A Unspecified open wound of abdominal wall, left upper quadrant with penetration into peritoneal cavity, initial encounter: Secondary | ICD-10-CM | POA: Diagnosis not present

## 2018-05-05 ENCOUNTER — Ambulatory Visit: Payer: Self-pay | Admitting: Family Medicine

## 2018-05-05 NOTE — Telephone Encounter (Signed)
Patient to take Protonix twice a day and observe stomach symptoms

## 2018-05-05 NOTE — Telephone Encounter (Signed)
Patient has had reflux for 2 days- she has used Tums, Prilosec, tagamet and Pepto Bismol. Patient state she is still going to the bathroom regularly.  Patient thinks she is having an exacerbation of her GERD and the OTC medications are not helping her- (Tums helps- but not for long) she is requesting Rx. Told patient I would send request to PCP- and she is ok with that- if she needs to be seen- she can come tomorrow.  Reason for Disposition . [1] MODERATE pain (e.g., interferes with normal activities) AND [2] comes and goes (cramps) AND [3] present > 24 hours  (Exception: pain with Vomiting or Diarrhea - see that Guideline)  Answer Assessment - Initial Assessment Questions 1. LOCATION: "Where does it hurt?"      Middle of chest in between breast 2. RADIATION: "Does the pain shoot anywhere else?" (e.g., chest, back)     To her back 3. ONSET: "When did the pain begin?" (e.g., minutes, hours or days ago)      3 days 4. SUDDEN: "Gradual or sudden onset?"     Gradual onset 5. PATTERN "Does the pain come and go, or is it constant?"    - If constant: "Is it getting better, staying the same, or worsening?"      (Note: Constant means the pain never goes away completely; most serious pain is constant and it progresses)     - If intermittent: "How long does it last?" "Do you have pain now?"     (Note: Intermittent means the pain goes away completely between bouts)     If she eats a Tums it helps- but 1 hour later it is back 6. SEVERITY: "How bad is the pain?"  (e.g., Scale 1-10; mild, moderate, or severe)    - MILD (1-3): doesn't interfere with normal activities, abdomen soft and not tender to touch     - MODERATE (4-7): interferes with normal activities or awakens from sleep, tender to touch     - SEVERE (8-10): excruciating pain, doubled over, unable to do any normal activities       Ongoing- 8      Now-6 7. RECURRENT SYMPTOM: "Have you ever had this type of abdominal pain before?" If so, ask: "When  was the last time?" and "What happened that time?"      Yes- since February 8. AGGRAVATING FACTORS: "Does anything seem to cause this pain?" (e.g., foods, stress, alcohol)     Eating spicy food 9. CARDIAC SYMPTOMS: "Do you have any of the following symptoms: chest pain, difficulty breathing, sweating, nausea?"     Nausea- vomited once- she thinks she ate too fast- no vomiting since Monday 10. OTHER SYMPTOMS: "Do you have any other symptoms?" (e.g., fever, vomiting, diarrhea)       Vomiting- twice 11. PREGNANCY: "Is there any chance you are pregnant?" "When was your last menstrual period?"       n/a  Protocols used: ABDOMINAL PAIN - UPPER-A-AH

## 2018-05-05 NOTE — Telephone Encounter (Signed)
Call pt Take Protonix twice a day and observe symptoms

## 2018-05-05 NOTE — Telephone Encounter (Addendum)
Phone call Discussed with patient already taking Protonix twice a day patient will check with her surgeon.   Patient was transferred to provider for telephone conversation.

## 2018-05-09 ENCOUNTER — Telehealth: Payer: Self-pay | Admitting: Family Medicine

## 2018-05-09 DIAGNOSIS — F329 Major depressive disorder, single episode, unspecified: Secondary | ICD-10-CM

## 2018-05-09 DIAGNOSIS — F32A Depression, unspecified: Secondary | ICD-10-CM

## 2018-05-09 MED ORDER — SUCRALFATE 1 G PO TABS: 1.0000 g | ORAL_TABLET | Freq: Three times a day (TID) | ORAL | 12 refills | Status: DC

## 2018-05-09 MED ORDER — DIAZEPAM 5 MG PO TABS: 5.0000 mg | ORAL_TABLET | Freq: Two times a day (BID) | ORAL | 1 refills | Status: DC

## 2018-05-09 NOTE — Telephone Encounter (Signed)
Refill of meds by historical provider  LOV  04/21/18  Dr. Dossie Arbourrissman    Valium LRF 04/13/18 #60  0 refills  Carafate  LRF 04/14/18  100  2 refills  [id number] [place], [place] - [address]          [phone] (Phone) [phone] (Fax

## 2018-05-09 NOTE — Telephone Encounter (Signed)
Copied from CRM [id number]. Topic: Quick Communication - Rx Refill/Question >> May 09, 2018  9:28 AM Luanna Coleawoud, Jessica L wrote: Medication:sucralfate (CARAFATE) 1 g tablet [[id number]][[id number]] diazepam (VALIUM) 5 MG tablet [[id number]][[id number]]   Has the patient contacted their pharmacy? No  Preferred Pharmacy (with phone number or street name):[id number] [place], [place] - [address] [phone] (Phone) [phone] (Fax  Agent: Please be advised that RX refills may take up to 3 business days. We ask that you follow-up with your pharmacy.

## 2018-05-10 ENCOUNTER — Other Ambulatory Visit: Payer: Self-pay | Admitting: Family Medicine

## 2018-05-10 NOTE — Telephone Encounter (Signed)
tramadol refill Last Refill:04/29/18 # 30 Last OV: 04/21/18 PCP: Dr Dossie Arbourrissman [facility] [address] [place] [place]

## 2018-05-11 DIAGNOSIS — Z433 Encounter for attention to colostomy: Secondary | ICD-10-CM | POA: Diagnosis not present

## 2018-05-11 DIAGNOSIS — S31601A Unspecified open wound of abdominal wall, left upper quadrant with penetration into peritoneal cavity, initial encounter: Secondary | ICD-10-CM | POA: Diagnosis not present

## 2018-05-16 ENCOUNTER — Encounter: Payer: Self-pay | Admitting: Surgery

## 2018-05-16 ENCOUNTER — Ambulatory Visit (INDEPENDENT_AMBULATORY_CARE_PROVIDER_SITE_OTHER): Payer: Medicare HMO | Admitting: Surgery

## 2018-05-16 VITALS — BP 138/94 | HR 72 | Temp 97.7°F | Ht 62.0 in | Wt 86.4 lb

## 2018-05-16 DIAGNOSIS — Z09 Encounter for follow-up examination after completed treatment for conditions other than malignant neoplasm: Secondary | ICD-10-CM

## 2018-05-16 MED ORDER — SUCRALFATE 1 G PO TABS: 1.0000 g | ORAL_TABLET | Freq: Two times a day (BID) | ORAL | 0 refills | Status: DC

## 2018-05-16 NOTE — Patient Instructions (Signed)
[facility] and EGD for Colostomy takedown.  Patient currently see Dr.Vanga but is requesting for Dr.Anna.  Please pick up your medication at the pharmacy.

## 2018-05-16 NOTE — Progress Notes (Signed)
S/p Hartmann's for perf diverticulitis 5/31 Doing very well now She Had recent episode of Upper gi bleed from ulcer s/p embolization Denies any melena or hematemesis Eating better Doing wet/dry to the wound  PE NAD Abd: open wound healing well, good granulation. No infection. Wound measures 5 x 3 cms. No peritonitis  A/ p Doing well Refill carafate Refer to GI for f/u ulcer and preop colonoscopy before takedown I will anticipate colostomy takedown once her nutritional status is optimized   And her wound has healed  Likely in 2-3 months

## 2018-05-18 DIAGNOSIS — J449 Chronic obstructive pulmonary disease, unspecified: Secondary | ICD-10-CM | POA: Diagnosis not present

## 2018-05-18 DIAGNOSIS — N390 Urinary tract infection, site not specified: Secondary | ICD-10-CM | POA: Diagnosis not present

## 2018-05-18 DIAGNOSIS — K219 Gastro-esophageal reflux disease without esophagitis: Secondary | ICD-10-CM | POA: Diagnosis not present

## 2018-05-18 DIAGNOSIS — K5720 Diverticulitis of large intestine with perforation and abscess without bleeding: Secondary | ICD-10-CM | POA: Diagnosis not present

## 2018-05-18 DIAGNOSIS — Z433 Encounter for attention to colostomy: Secondary | ICD-10-CM | POA: Diagnosis not present

## 2018-05-18 DIAGNOSIS — D649 Anemia, unspecified: Secondary | ICD-10-CM | POA: Diagnosis not present

## 2018-05-18 DIAGNOSIS — M810 Age-related osteoporosis without current pathological fracture: Secondary | ICD-10-CM | POA: Diagnosis not present

## 2018-05-18 DIAGNOSIS — Z48815 Encounter for surgical aftercare following surgery on the digestive system: Secondary | ICD-10-CM | POA: Diagnosis not present

## 2018-05-18 DIAGNOSIS — E039 Hypothyroidism, unspecified: Secondary | ICD-10-CM | POA: Diagnosis not present

## 2018-05-18 DIAGNOSIS — K260 Acute duodenal ulcer with hemorrhage: Secondary | ICD-10-CM | POA: Diagnosis not present

## 2018-05-20 DIAGNOSIS — Z433 Encounter for attention to colostomy: Secondary | ICD-10-CM | POA: Diagnosis not present

## 2018-05-25 ENCOUNTER — Encounter: Payer: Medicare Other | Admitting: Family Medicine

## 2018-05-25 DIAGNOSIS — Z433 Encounter for attention to colostomy: Secondary | ICD-10-CM | POA: Diagnosis not present

## 2018-05-25 DIAGNOSIS — K5720 Diverticulitis of large intestine with perforation and abscess without bleeding: Secondary | ICD-10-CM | POA: Diagnosis not present

## 2018-05-25 DIAGNOSIS — Z48815 Encounter for surgical aftercare following surgery on the digestive system: Secondary | ICD-10-CM | POA: Diagnosis not present

## 2018-05-25 DIAGNOSIS — J449 Chronic obstructive pulmonary disease, unspecified: Secondary | ICD-10-CM | POA: Diagnosis not present

## 2018-05-25 DIAGNOSIS — K219 Gastro-esophageal reflux disease without esophagitis: Secondary | ICD-10-CM | POA: Diagnosis not present

## 2018-05-25 DIAGNOSIS — K260 Acute duodenal ulcer with hemorrhage: Secondary | ICD-10-CM | POA: Diagnosis not present

## 2018-05-25 DIAGNOSIS — E039 Hypothyroidism, unspecified: Secondary | ICD-10-CM | POA: Diagnosis not present

## 2018-05-25 DIAGNOSIS — M810 Age-related osteoporosis without current pathological fracture: Secondary | ICD-10-CM | POA: Diagnosis not present

## 2018-05-25 DIAGNOSIS — N390 Urinary tract infection, site not specified: Secondary | ICD-10-CM | POA: Diagnosis not present

## 2018-05-25 DIAGNOSIS — D649 Anemia, unspecified: Secondary | ICD-10-CM | POA: Diagnosis not present

## 2018-05-31 ENCOUNTER — Telehealth: Payer: Self-pay | Admitting: Gastroenterology

## 2018-05-31 NOTE — Telephone Encounter (Signed)
Patient states her doctor, Dr.Pabon wants her to see Dr.Anna to discuss ulcers. Patient already sees Dr.Vanga but states she does not want to see her again due to private issues that she refused to discuss. Patient requesting to switch providers. Is this switch okay with both providers?

## 2018-06-01 ENCOUNTER — Telehealth: Payer: Self-pay | Admitting: Family Medicine

## 2018-06-01 DIAGNOSIS — D649 Anemia, unspecified: Secondary | ICD-10-CM | POA: Diagnosis not present

## 2018-06-01 DIAGNOSIS — J449 Chronic obstructive pulmonary disease, unspecified: Secondary | ICD-10-CM | POA: Diagnosis not present

## 2018-06-01 DIAGNOSIS — M810 Age-related osteoporosis without current pathological fracture: Secondary | ICD-10-CM | POA: Diagnosis not present

## 2018-06-01 DIAGNOSIS — K5720 Diverticulitis of large intestine with perforation and abscess without bleeding: Secondary | ICD-10-CM | POA: Diagnosis not present

## 2018-06-01 DIAGNOSIS — K219 Gastro-esophageal reflux disease without esophagitis: Secondary | ICD-10-CM | POA: Diagnosis not present

## 2018-06-01 DIAGNOSIS — E039 Hypothyroidism, unspecified: Secondary | ICD-10-CM | POA: Diagnosis not present

## 2018-06-01 DIAGNOSIS — Z48815 Encounter for surgical aftercare following surgery on the digestive system: Secondary | ICD-10-CM | POA: Diagnosis not present

## 2018-06-01 DIAGNOSIS — K260 Acute duodenal ulcer with hemorrhage: Secondary | ICD-10-CM | POA: Diagnosis not present

## 2018-06-01 DIAGNOSIS — Z433 Encounter for attention to colostomy: Secondary | ICD-10-CM | POA: Diagnosis not present

## 2018-06-01 DIAGNOSIS — N390 Urinary tract infection, site not specified: Secondary | ICD-10-CM | POA: Diagnosis not present

## 2018-06-01 NOTE — Telephone Encounter (Signed)
Copied from CRM [id number]. Topic: Quick Communication - See Telephone Encounter >> Jun 01, 2018  4:20 PM [name], [name] wrote: CRM for notification. See Telephone encounter for: 06/01/18. Amy with [facility] calling and states that the patient is doing great, but she was going downstairs Monday (05/30/18) morning before nurse got there and missed the very last step. States that she fell on her bottom (left side). States that there is no bruising, she has full range of motion. Just wanted to make Dr Dossie Arbour aware of her fall. CB#: [phone]

## 2018-06-02 NOTE — Telephone Encounter (Signed)
FYI

## 2018-06-03 ENCOUNTER — Other Ambulatory Visit: Payer: Self-pay | Admitting: Family Medicine

## 2018-06-03 NOTE — Telephone Encounter (Signed)
Tramedol  refill Last Refill:05/10/18 # 30 Last OV: 05/16/18 PCP: Dossie Arbour [facility] [phone]

## 2018-06-06 DIAGNOSIS — E039 Hypothyroidism, unspecified: Secondary | ICD-10-CM | POA: Diagnosis not present

## 2018-06-06 DIAGNOSIS — J449 Chronic obstructive pulmonary disease, unspecified: Secondary | ICD-10-CM | POA: Diagnosis not present

## 2018-06-06 DIAGNOSIS — K5720 Diverticulitis of large intestine with perforation and abscess without bleeding: Secondary | ICD-10-CM | POA: Diagnosis not present

## 2018-06-06 DIAGNOSIS — M810 Age-related osteoporosis without current pathological fracture: Secondary | ICD-10-CM | POA: Diagnosis not present

## 2018-06-06 DIAGNOSIS — Z48815 Encounter for surgical aftercare following surgery on the digestive system: Secondary | ICD-10-CM | POA: Diagnosis not present

## 2018-06-06 DIAGNOSIS — K219 Gastro-esophageal reflux disease without esophagitis: Secondary | ICD-10-CM | POA: Diagnosis not present

## 2018-06-06 DIAGNOSIS — Z433 Encounter for attention to colostomy: Secondary | ICD-10-CM | POA: Diagnosis not present

## 2018-06-06 DIAGNOSIS — D649 Anemia, unspecified: Secondary | ICD-10-CM | POA: Diagnosis not present

## 2018-06-06 DIAGNOSIS — K260 Acute duodenal ulcer with hemorrhage: Secondary | ICD-10-CM | POA: Diagnosis not present

## 2018-06-06 DIAGNOSIS — N390 Urinary tract infection, site not specified: Secondary | ICD-10-CM | POA: Diagnosis not present

## 2018-06-08 DIAGNOSIS — K219 Gastro-esophageal reflux disease without esophagitis: Secondary | ICD-10-CM | POA: Diagnosis not present

## 2018-06-08 DIAGNOSIS — K260 Acute duodenal ulcer with hemorrhage: Secondary | ICD-10-CM | POA: Diagnosis not present

## 2018-06-08 DIAGNOSIS — M810 Age-related osteoporosis without current pathological fracture: Secondary | ICD-10-CM | POA: Diagnosis not present

## 2018-06-08 DIAGNOSIS — K5720 Diverticulitis of large intestine with perforation and abscess without bleeding: Secondary | ICD-10-CM | POA: Diagnosis not present

## 2018-06-08 DIAGNOSIS — Z433 Encounter for attention to colostomy: Secondary | ICD-10-CM | POA: Diagnosis not present

## 2018-06-08 DIAGNOSIS — N390 Urinary tract infection, site not specified: Secondary | ICD-10-CM | POA: Diagnosis not present

## 2018-06-08 DIAGNOSIS — D649 Anemia, unspecified: Secondary | ICD-10-CM | POA: Diagnosis not present

## 2018-06-08 DIAGNOSIS — J449 Chronic obstructive pulmonary disease, unspecified: Secondary | ICD-10-CM | POA: Diagnosis not present

## 2018-06-08 DIAGNOSIS — Z48815 Encounter for surgical aftercare following surgery on the digestive system: Secondary | ICD-10-CM | POA: Diagnosis not present

## 2018-06-08 DIAGNOSIS — E039 Hypothyroidism, unspecified: Secondary | ICD-10-CM | POA: Diagnosis not present

## 2018-06-10 ENCOUNTER — Other Ambulatory Visit: Payer: Self-pay

## 2018-06-10 ENCOUNTER — Encounter: Payer: Self-pay | Admitting: Gastroenterology

## 2018-06-10 ENCOUNTER — Ambulatory Visit: Payer: Medicare HMO | Admitting: Gastroenterology

## 2018-06-10 VITALS — BP 130/83 | HR 109 | Ht 62.0 in | Wt 90.2 lb

## 2018-06-10 DIAGNOSIS — K269 Duodenal ulcer, unspecified as acute or chronic, without hemorrhage or perforation: Secondary | ICD-10-CM | POA: Diagnosis not present

## 2018-06-10 DIAGNOSIS — K5732 Diverticulitis of large intestine without perforation or abscess without bleeding: Secondary | ICD-10-CM | POA: Diagnosis not present

## 2018-06-10 DIAGNOSIS — R69 Illness, unspecified: Secondary | ICD-10-CM | POA: Diagnosis not present

## 2018-06-10 MED ORDER — ONDANSETRON HCL 4 MG PO TABS: ORAL_TABLET | ORAL | 3 refills | Status: DC

## 2018-06-10 NOTE — Progress Notes (Signed)
Wyline MoodKiran Sakinah Rosamond MD, MRCP(U.K) [address]  [address]  [place], [place] [address]  Main: [phone]  Fax: [phone]   Primary Care Physician: Steele Sizerrissman, Mark A, MD  Primary Gastroenterologist:  Dr. Wyline MoodKiran Darnella Zeiter   Chief Complaint  Patient presents with  . Gastric Ulcer    HPI: Annette Mattocksamela S Miller is a 67 y.o. female  Summary of history :  Past history reviewed and summarized.   She is here today to see me for a transfer of care from Dr Allegra LaiVanga. She was initially seen in 12/2017 for peptic ulcer disease. She went to the ER in 11/2017 with abdominal pain and found to have on /ct scan a gastric ulcer. Treated by Dr Dossie Arbourrissman for H pylori with triple therapy , no test was done for H pylori before treatment. Had been on long term Asprin 325 mg a day .   Colonoscopy 12/2017 was a poor prep, diverticulosis seen . No polyps.  EGD 01/14/18 : Large duodenal ulcer seen , treated with heat therapy.   Admitted in 03/2018 with melena and a duodenal ulcer with black pigmented spot not bleeding seen . Treated with PPI. Subsequently had a re bleed , Code blue event - underwent embolization of the gastrodudenal artery . Did well after and discharged.   She underwent hartmans procedure for perforated diverticulitis on 02/25/18 and sees Dr Everlene FarrierPabon.    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 Was on asprin and now no on it either. Not on meloxicam either.    She is on carafate, protonix BID. Still has abdominal pain . She thinks it is scar tissue.    Current Outpatient Medications  Medication Sig Dispense Refill  . acetaminophen (TYLENOL) 325 MG tablet Take 650 mg by mouth every 6 (six) hours as needed for mild pain or fever.    . Ascorbic Acid (VITAMIN C) 1000 MG tablet Take 3,000 mg by mouth daily.    . Calcium Carb-Cholecalciferol (OYSTER SHELL CALCIUM PLUS D PO) Take 1 tablet by mouth daily.     . diazepam (VALIUM) 5  MG tablet Take 1 tablet (5 mg total) by mouth 2 (two) times daily. 60 tablet 1  . diphenoxylate-atropine (LOMOTIL) 2.5-0.025 MG tablet Take 1 tablet by mouth 4 (four) times daily as needed for diarrhea or loose stools. (Patient not taking: Reported on 04/21/2018) 30 tablet 0  . fexofenadine (ALLEGRA) 180 MG tablet fexofenadine 180 mg tablet  TAKE 1 TABLET (180 MG TOTAL) BY MOUTH DAILY.    Marland Kitchen. loratadine (CLARITIN) 10 MG tablet Take 1 tablet (10 mg total) by mouth daily. 30 tablet 11  . magnesium 30 MG tablet Take 30 mg by mouth daily.     . meloxicam (MOBIC) 15 MG tablet Take 15 mg by mouth daily.  6  . Multiple Vitamin (MULTIVITAMIN WITH MINERALS) TABS tablet Take 1 tablet by mouth daily.    . ondansetron (ZOFRAN) 4 MG tablet TAKE 1 TABLET BY MOUTH EVERY 8 HOURS AS NEEDED FOR NAUSEA AND VOMITING 20 tablet 3  . oxyCODONE (OXY IR/ROXICODONE) 5 MG immediate release tablet Take 1 tablet (5 mg total) by mouth every 4 (four) hours as needed for severe pain. (Patient not taking: Reported on 04/21/2018) 30 tablet 0  . pantoprazole (PROTONIX) 40 MG tablet TAKE 1 TABLET BY MOUTH TWICE A DAY 60 tablet 2  . Selenium 200 MCG CAPS Take by mouth  daily.    . simethicone (GAS-X) 80 MG chewable tablet Chew 1 tablet (80 mg total) by mouth 3 (three) times daily. 100 tablet 2  . sucralfate (CARAFATE) 1 g tablet Take 1 tablet (1 g total) by mouth 4 (four) times daily -  with meals and at bedtime. 240 tablet 12  . sucralfate (CARAFATE) 1 g tablet Take 1 tablet (1 g total) by mouth 2 (two) times daily. 60 tablet 0  . traMADol (ULTRAM) 50 MG tablet TAKE 1 TABLET (50 MG TOTAL) BY MOUTH EVERY 8 (EIGHT) HOURS AS NEEDED. 30 tablet 1  . vitamin E 1000 UNIT capsule Take 1,000 Units by mouth 2 (two) times daily.    . Zinc Sulfate (ZINC 15 PO) Take by mouth daily.     No current facility-administered medications for this visit.     Allergies as of 06/10/2018 - Review Complete 06/10/2018  Allergen Reaction Noted  . Codeine  Diarrhea and Nausea And Vomiting 04/30/2015  . Prednisone Other (See Comments) 12/09/2017  . Tape Rash 04/08/2018    ROS:  General: Negative for anorexia, weight loss, fever, chills, fatigue, weakness. ENT: Negative for hoarseness, difficulty swallowing , nasal congestion. CV: Negative for chest pain, angina, palpitations, dyspnea on exertion, peripheral edema.  Respiratory: Negative for dyspnea at rest, dyspnea on exertion, cough, sputum, wheezing.  GI: See history of present illness. GU:  Negative for dysuria, hematuria, urinary incontinence, urinary frequency, nocturnal urination.  Endo: Negative for unusual weight change.    Physical Examination:   BP 130/83   Pulse (!) 109   Ht 5\' 2"  (1.575 m)   Wt 90 lb 3.2 oz (40.9 kg)   BMI 16.50 kg/m   General: very htin  well-developed in no acute distress.  Eyes: No icterus. Conjunctivae pink. Mouth: Oropharyngeal mucosa moist and pink , no lesions erythema or exudate. Lungs: Clear to auscultation bilaterally. Non-labored. Heart: Regular rate and rhythm, no murmurs rubs or gallops.  Abdomen: large mid line scar, colostomy bag LUQ,wound with healing granulaiton tissue lower end of the scar above the pubis. No tenderness, no guarding or rigitdity.  Extremities: No lower extremity edema. No clubbing or deformities. Neuro: Alert and oriented x 3.  Grossly intact. Skin: Warm and dry, no jaundice.   Psych: Alert and cooperative, normal mood and affect.   Imaging Studies: No results found.  Assessment and Plan:   Annette Miller is a 67 y.o. y/o female is here today to see for transfer of care. H/o duodenal ulcer that bled x 2, perforated divereticulitis of the colon , s/p hartmans procedure. Here to transfer of care. Old records reviewed and summarized. I have discussed the timing of the procedure with Dr Everlene Farrier and feels we should be good to perform it at this time from the surgical point of view.   Plan  1. EGD+ colonoscopy  2.  Continue PPI 3. H pylori breath test  4. No NSAID's.   I have discussed alternative options, risks & benefits,  which include, but are not limited to, bleeding, infection, perforation,respiratory complication & drug reaction.  The patient agrees with this plan & written consent will be obtained.     Dr [name]  MD,MRCP [facility]) Follow up in 8 weeks

## 2018-06-12 ENCOUNTER — Encounter: Payer: Self-pay | Admitting: Gastroenterology

## 2018-06-12 LAB — H. PYLORI BREATH TEST: H pylori Breath Test: NEGATIVE

## 2018-06-13 DIAGNOSIS — K5720 Diverticulitis of large intestine with perforation and abscess without bleeding: Secondary | ICD-10-CM | POA: Diagnosis not present

## 2018-06-13 DIAGNOSIS — J449 Chronic obstructive pulmonary disease, unspecified: Secondary | ICD-10-CM | POA: Diagnosis not present

## 2018-06-13 DIAGNOSIS — K260 Acute duodenal ulcer with hemorrhage: Secondary | ICD-10-CM | POA: Diagnosis not present

## 2018-06-13 DIAGNOSIS — M810 Age-related osteoporosis without current pathological fracture: Secondary | ICD-10-CM | POA: Diagnosis not present

## 2018-06-13 DIAGNOSIS — Z48815 Encounter for surgical aftercare following surgery on the digestive system: Secondary | ICD-10-CM | POA: Diagnosis not present

## 2018-06-13 DIAGNOSIS — E039 Hypothyroidism, unspecified: Secondary | ICD-10-CM | POA: Diagnosis not present

## 2018-06-13 DIAGNOSIS — Z433 Encounter for attention to colostomy: Secondary | ICD-10-CM | POA: Diagnosis not present

## 2018-06-13 DIAGNOSIS — N390 Urinary tract infection, site not specified: Secondary | ICD-10-CM | POA: Diagnosis not present

## 2018-06-13 DIAGNOSIS — K219 Gastro-esophageal reflux disease without esophagitis: Secondary | ICD-10-CM | POA: Diagnosis not present

## 2018-06-13 DIAGNOSIS — D649 Anemia, unspecified: Secondary | ICD-10-CM | POA: Diagnosis not present

## 2018-06-14 ENCOUNTER — Telehealth: Payer: Self-pay | Admitting: *Deleted

## 2018-06-14 DIAGNOSIS — K219 Gastro-esophageal reflux disease without esophagitis: Secondary | ICD-10-CM | POA: Diagnosis not present

## 2018-06-14 DIAGNOSIS — D649 Anemia, unspecified: Secondary | ICD-10-CM | POA: Diagnosis not present

## 2018-06-14 DIAGNOSIS — M810 Age-related osteoporosis without current pathological fracture: Secondary | ICD-10-CM | POA: Diagnosis not present

## 2018-06-14 DIAGNOSIS — E039 Hypothyroidism, unspecified: Secondary | ICD-10-CM | POA: Diagnosis not present

## 2018-06-14 DIAGNOSIS — J449 Chronic obstructive pulmonary disease, unspecified: Secondary | ICD-10-CM | POA: Diagnosis not present

## 2018-06-14 DIAGNOSIS — K5720 Diverticulitis of large intestine with perforation and abscess without bleeding: Secondary | ICD-10-CM | POA: Diagnosis not present

## 2018-06-14 DIAGNOSIS — Z48815 Encounter for surgical aftercare following surgery on the digestive system: Secondary | ICD-10-CM | POA: Diagnosis not present

## 2018-06-14 DIAGNOSIS — K260 Acute duodenal ulcer with hemorrhage: Secondary | ICD-10-CM | POA: Diagnosis not present

## 2018-06-14 DIAGNOSIS — Z433 Encounter for attention to colostomy: Secondary | ICD-10-CM | POA: Diagnosis not present

## 2018-06-14 DIAGNOSIS — N390 Urinary tract infection, site not specified: Secondary | ICD-10-CM | POA: Diagnosis not present

## 2018-06-14 NOTE — Telephone Encounter (Signed)
[name] call stated that Annette Miller had a abdominal wound that they have been helping take care of. Misty stated that since last week the wound has not improved and may need possible debridement. Annette Miller is a patient of Dr.Pabon. Misty requested call back from a nurse

## 2018-06-15 NOTE — Telephone Encounter (Signed)
Left detailed message that I spoke with Dr Everlene FarrierPabon and he feels this wound is going to take 2-3 months to heal. Her nutrition needs to be optimized to help healing. He would be glad to see her if s/s of infection are noted or if appointment is desired.

## 2018-06-17 ENCOUNTER — Telehealth: Payer: Self-pay | Admitting: Gastroenterology

## 2018-06-17 DIAGNOSIS — D649 Anemia, unspecified: Secondary | ICD-10-CM | POA: Diagnosis not present

## 2018-06-17 DIAGNOSIS — E039 Hypothyroidism, unspecified: Secondary | ICD-10-CM | POA: Diagnosis not present

## 2018-06-17 DIAGNOSIS — K260 Acute duodenal ulcer with hemorrhage: Secondary | ICD-10-CM | POA: Diagnosis not present

## 2018-06-17 DIAGNOSIS — K219 Gastro-esophageal reflux disease without esophagitis: Secondary | ICD-10-CM | POA: Diagnosis not present

## 2018-06-17 DIAGNOSIS — J449 Chronic obstructive pulmonary disease, unspecified: Secondary | ICD-10-CM | POA: Diagnosis not present

## 2018-06-17 DIAGNOSIS — N390 Urinary tract infection, site not specified: Secondary | ICD-10-CM | POA: Diagnosis not present

## 2018-06-17 DIAGNOSIS — Z433 Encounter for attention to colostomy: Secondary | ICD-10-CM | POA: Diagnosis not present

## 2018-06-17 DIAGNOSIS — Z48815 Encounter for surgical aftercare following surgery on the digestive system: Secondary | ICD-10-CM | POA: Diagnosis not present

## 2018-06-17 DIAGNOSIS — K5720 Diverticulitis of large intestine with perforation and abscess without bleeding: Secondary | ICD-10-CM | POA: Diagnosis not present

## 2018-06-17 DIAGNOSIS — M810 Age-related osteoporosis without current pathological fracture: Secondary | ICD-10-CM | POA: Diagnosis not present

## 2018-06-17 NOTE — Telephone Encounter (Signed)
Pt Nurse, Amy would like for pt to have Pre-albumin & would like to get your ok prior to having it. Would like to have it done asap. Nurse would also like to know if the dr. Would need any labs drawn during this process. [phone]

## 2018-06-20 ENCOUNTER — Telehealth: Payer: Self-pay | Admitting: Gastroenterology

## 2018-06-20 NOTE — Telephone Encounter (Signed)
Called pt [facility] nurse Amy, LM informing her that Dr. Tobi BastosAnna has given approval for the pre albumin check and cbc lab tests.

## 2018-06-20 NOTE — Telephone Encounter (Signed)
[facility] carafate  2 times a day and to [place] please call pharmacy

## 2018-06-20 NOTE — Telephone Encounter (Signed)
Ok check pre albumin and cbc

## 2018-06-20 NOTE — Telephone Encounter (Signed)
Called pt pharmacy to change Suprep [place]. Spoke with pt to notify her that the request for gavilyte was fulfilled. Also informed pt that I will consult with Dr. Tobi BastosAnna about her request for a refill on the carafate.

## 2018-06-21 ENCOUNTER — Other Ambulatory Visit: Payer: Self-pay

## 2018-06-21 DIAGNOSIS — K269 Duodenal ulcer, unspecified as acute or chronic, without hemorrhage or perforation: Secondary | ICD-10-CM

## 2018-06-21 MED ORDER — SUCRALFATE 1 G PO TABS: 1.0000 g | ORAL_TABLET | Freq: Two times a day (BID) | ORAL | 1 refills | Status: DC

## 2018-06-21 NOTE — Progress Notes (Signed)
[facility], pt called and stated she is no longer eligible to use [facility] mail service. I[facility] [place] [facility] by [facility]. I have also refilled pt sucralfate with Dr. Johnney KillianAnna's approval.

## 2018-06-22 DIAGNOSIS — J449 Chronic obstructive pulmonary disease, unspecified: Secondary | ICD-10-CM | POA: Diagnosis not present

## 2018-06-22 DIAGNOSIS — Z48815 Encounter for surgical aftercare following surgery on the digestive system: Secondary | ICD-10-CM | POA: Diagnosis not present

## 2018-06-22 DIAGNOSIS — N390 Urinary tract infection, site not specified: Secondary | ICD-10-CM | POA: Diagnosis not present

## 2018-06-22 DIAGNOSIS — K219 Gastro-esophageal reflux disease without esophagitis: Secondary | ICD-10-CM | POA: Diagnosis not present

## 2018-06-22 DIAGNOSIS — D649 Anemia, unspecified: Secondary | ICD-10-CM | POA: Diagnosis not present

## 2018-06-22 DIAGNOSIS — E039 Hypothyroidism, unspecified: Secondary | ICD-10-CM | POA: Diagnosis not present

## 2018-06-22 DIAGNOSIS — M810 Age-related osteoporosis without current pathological fracture: Secondary | ICD-10-CM | POA: Diagnosis not present

## 2018-06-22 DIAGNOSIS — K5720 Diverticulitis of large intestine with perforation and abscess without bleeding: Secondary | ICD-10-CM | POA: Diagnosis not present

## 2018-06-22 DIAGNOSIS — K260 Acute duodenal ulcer with hemorrhage: Secondary | ICD-10-CM | POA: Diagnosis not present

## 2018-06-22 DIAGNOSIS — Z433 Encounter for attention to colostomy: Secondary | ICD-10-CM | POA: Diagnosis not present

## 2018-06-22 DIAGNOSIS — M1990 Unspecified osteoarthritis, unspecified site: Secondary | ICD-10-CM | POA: Diagnosis not present

## 2018-06-23 ENCOUNTER — Encounter: Payer: Self-pay | Admitting: Emergency Medicine

## 2018-06-24 ENCOUNTER — Encounter
Admission: RE | Disposition: A | Discharge status: Home / Self Care | Payer: Self-pay | Source: Ambulatory Visit | Attending: Gastroenterology

## 2018-06-24 ENCOUNTER — Ambulatory Visit: Payer: Medicare HMO | Admitting: Anesthesiology

## 2018-06-24 ENCOUNTER — Encounter: Payer: Self-pay | Admitting: Anesthesiology

## 2018-06-24 ENCOUNTER — Ambulatory Visit
Admission: RE | Admit: 2018-06-24 | Discharge: 2018-06-24 | Disposition: A | Discharge status: Home / Self Care | Payer: Medicare HMO | Source: Ambulatory Visit | Attending: Gastroenterology | Admitting: Gastroenterology

## 2018-06-24 DIAGNOSIS — Z791 Long term (current) use of non-steroidal anti-inflammatories (NSAID): Secondary | ICD-10-CM | POA: Diagnosis not present

## 2018-06-24 DIAGNOSIS — Z90710 Acquired absence of both cervix and uterus: Secondary | ICD-10-CM | POA: Insufficient documentation

## 2018-06-24 DIAGNOSIS — E785 Hyperlipidemia, unspecified: Secondary | ICD-10-CM | POA: Diagnosis not present

## 2018-06-24 DIAGNOSIS — Z79891 Long term (current) use of opiate analgesic: Secondary | ICD-10-CM | POA: Diagnosis not present

## 2018-06-24 DIAGNOSIS — Z888 Allergy status to other drugs, medicaments and biological substances status: Secondary | ICD-10-CM | POA: Diagnosis not present

## 2018-06-24 DIAGNOSIS — Z8711 Personal history of peptic ulcer disease: Secondary | ICD-10-CM | POA: Diagnosis not present

## 2018-06-24 DIAGNOSIS — E039 Hypothyroidism, unspecified: Secondary | ICD-10-CM | POA: Diagnosis not present

## 2018-06-24 DIAGNOSIS — N390 Urinary tract infection, site not specified: Secondary | ICD-10-CM | POA: Diagnosis not present

## 2018-06-24 DIAGNOSIS — Z79899 Other long term (current) drug therapy: Secondary | ICD-10-CM | POA: Diagnosis not present

## 2018-06-24 DIAGNOSIS — Z823 Family history of stroke: Secondary | ICD-10-CM | POA: Insufficient documentation

## 2018-06-24 DIAGNOSIS — K5732 Diverticulitis of large intestine without perforation or abscess without bleeding: Secondary | ICD-10-CM | POA: Diagnosis not present

## 2018-06-24 DIAGNOSIS — K5730 Diverticulosis of large intestine without perforation or abscess without bleeding: Secondary | ICD-10-CM | POA: Diagnosis not present

## 2018-06-24 DIAGNOSIS — F419 Anxiety disorder, unspecified: Secondary | ICD-10-CM | POA: Diagnosis not present

## 2018-06-24 DIAGNOSIS — Z48815 Encounter for surgical aftercare following surgery on the digestive system: Secondary | ICD-10-CM | POA: Diagnosis not present

## 2018-06-24 DIAGNOSIS — F17210 Nicotine dependence, cigarettes, uncomplicated: Secondary | ICD-10-CM | POA: Diagnosis not present

## 2018-06-24 DIAGNOSIS — Z885 Allergy status to narcotic agent status: Secondary | ICD-10-CM | POA: Insufficient documentation

## 2018-06-24 DIAGNOSIS — K260 Acute duodenal ulcer with hemorrhage: Secondary | ICD-10-CM | POA: Diagnosis not present

## 2018-06-24 DIAGNOSIS — Z09 Encounter for follow-up examination after completed treatment for conditions other than malignant neoplasm: Secondary | ICD-10-CM | POA: Insufficient documentation

## 2018-06-24 DIAGNOSIS — K269 Duodenal ulcer, unspecified as acute or chronic, without hemorrhage or perforation: Secondary | ICD-10-CM

## 2018-06-24 DIAGNOSIS — Z433 Encounter for attention to colostomy: Secondary | ICD-10-CM | POA: Diagnosis not present

## 2018-06-24 DIAGNOSIS — M810 Age-related osteoporosis without current pathological fracture: Secondary | ICD-10-CM | POA: Insufficient documentation

## 2018-06-24 DIAGNOSIS — K5720 Diverticulitis of large intestine with perforation and abscess without bleeding: Secondary | ICD-10-CM | POA: Diagnosis not present

## 2018-06-24 DIAGNOSIS — Z933 Colostomy status: Secondary | ICD-10-CM | POA: Diagnosis not present

## 2018-06-24 DIAGNOSIS — Z01818 Encounter for other preprocedural examination: Secondary | ICD-10-CM | POA: Insufficient documentation

## 2018-06-24 DIAGNOSIS — R69 Illness, unspecified: Secondary | ICD-10-CM | POA: Diagnosis not present

## 2018-06-24 DIAGNOSIS — K219 Gastro-esophageal reflux disease without esophagitis: Secondary | ICD-10-CM | POA: Diagnosis not present

## 2018-06-24 DIAGNOSIS — D649 Anemia, unspecified: Secondary | ICD-10-CM | POA: Diagnosis not present

## 2018-06-24 DIAGNOSIS — J449 Chronic obstructive pulmonary disease, unspecified: Secondary | ICD-10-CM | POA: Diagnosis not present

## 2018-06-24 HISTORY — DX: Anxiety disorder, unspecified: F41.9

## 2018-06-24 HISTORY — PX: COLONOSCOPY WITH PROPOFOL: SHX5780

## 2018-06-24 HISTORY — PX: ESOPHAGOGASTRODUODENOSCOPY (EGD) WITH PROPOFOL: SHX5813

## 2018-06-24 LAB — CBC AND DIFFERENTIAL
HCT: 33 — AB (ref 36–46)
Hemoglobin: 10.8 — AB (ref 12.0–16.0)
Platelets: 455 — AB (ref 150–399)
WBC: 6.6

## 2018-06-24 SURGERY — COLONOSCOPY WITH PROPOFOL
Anesthesia: General

## 2018-06-24 MED ORDER — PROPOFOL 10 MG/ML IV BOLUS
INTRAVENOUS | Status: DC | PRN
  Administered 2018-06-24: 30 mg via INTRAVENOUS
  Administered 2018-06-24: 20 mg via INTRAVENOUS

## 2018-06-24 MED ORDER — FENTANYL CITRATE (PF) 100 MCG/2ML IJ SOLN
INTRAMUSCULAR | Status: DC | PRN
  Administered 2018-06-24 (×2): 50 ug via INTRAVENOUS

## 2018-06-24 MED ORDER — MIDAZOLAM HCL 5 MG/5ML IJ SOLN
INTRAMUSCULAR | Status: DC | PRN
  Administered 2018-06-24: 2 mg via INTRAVENOUS

## 2018-06-24 MED ORDER — MIDAZOLAM HCL 2 MG/2ML IJ SOLN
INTRAMUSCULAR | Status: AC
  Filled 2018-06-24: qty 2

## 2018-06-24 MED ORDER — FENTANYL CITRATE (PF) 100 MCG/2ML IJ SOLN
INTRAMUSCULAR | Status: AC
  Filled 2018-06-24: qty 2

## 2018-06-24 MED ORDER — LIDOCAINE HCL (PF) 2 % IJ SOLN
INTRAMUSCULAR | Status: DC | PRN
  Administered 2018-06-24: 40 mg

## 2018-06-24 MED ORDER — SODIUM CHLORIDE 0.9 % IV SOLN
INTRAVENOUS | Status: DC
  Administered 2018-06-24: 09:00:00 via INTRAVENOUS

## 2018-06-24 MED ORDER — GLYCOPYRROLATE 0.2 MG/ML IJ SOLN
INTRAMUSCULAR | Status: AC
  Filled 2018-06-24: qty 1

## 2018-06-24 MED ORDER — PROPOFOL 500 MG/50ML IV EMUL
INTRAVENOUS | Status: DC | PRN
  Administered 2018-06-24: 50 ug/kg/min via INTRAVENOUS

## 2018-06-24 MED ORDER — LIDOCAINE HCL (PF) 2 % IJ SOLN
INTRAMUSCULAR | Status: AC
  Filled 2018-06-24: qty 10

## 2018-06-24 NOTE — Anesthesia Preprocedure Evaluation (Signed)
Anesthesia Evaluation  Patient identified by MRN, date of birth, ID band Patient awake    Reviewed: Allergy & Precautions, NPO status , Patient's Chart, lab work & pertinent test results, reviewed documented beta blocker date and time   Airway Mallampati: II  TM Distance: >3 FB     Dental  (+) Chipped   Pulmonary Current Smoker,           Cardiovascular      Neuro/Psych PSYCHIATRIC DISORDERS Anxiety Depression  Neuromuscular disease    GI/Hepatic PUD,   Endo/Other  Hypothyroidism   Renal/GU Renal disease     Musculoskeletal  (+) Arthritis ,   Abdominal   Peds  Hematology  (+) anemia ,   Anesthesia Other Findings   Reproductive/Obstetrics                             Anesthesia Physical Anesthesia Plan  ASA: III  Anesthesia Plan: General   Post-op Pain Management:    Induction: Intravenous  PONV Risk Score and Plan:   Airway Management Planned:   Additional Equipment:   Intra-op Plan:   Post-operative Plan:   Informed Consent: I have reviewed the patients History and Physical, chart, labs and discussed the procedure including the risks, benefits and alternatives for the proposed anesthesia with the patient or authorized representative who has indicated his/her understanding and acceptance.     Plan Discussed with: CRNA  Anesthesia Plan Comments:         Anesthesia Quick Evaluation

## 2018-06-24 NOTE — H&P (Signed)
[name], MD [address], [place], [place], [address] [address], [place], [place], [address] Phone: [phone]  Fax: [phone]  Primary Care Physician:  Steele Sizer, MD   Pre-Procedure History & Physical: HPI:  Annette Miller is a 67 y.o. female is here for an endoscopy and colonoscopy    Past Medical History:  Diagnosis Date  . Allergy   . Anemia   . Anxiety   . Arthritis   . Depression   . Goiter   . Hyperlipidemia   . Hypothyroidism   . Osteoporosis     Past Surgical History:  Procedure Laterality Date  . ABDOMINAL HYSTERECTOMY    . APPENDECTOMY    . COLONOSCOPY WITH PROPOFOL N/A 01/14/2018   Procedure: COLONOSCOPY WITH PROPOFOL;  Surgeon: Toney Reil, MD;  Location: [facility] ENDOSCOPY;  Service: Gastroenterology;  Laterality: N/A;  . ESOPHAGOGASTRODUODENOSCOPY N/A 04/06/2018   Procedure: ESOPHAGOGASTRODUODENOSCOPY (EGD);  Surgeon: Toney Reil, MD;  Location: [facility] ENDOSCOPY;  Service: Gastroenterology;  Laterality: N/A;  . ESOPHAGOGASTRODUODENOSCOPY (EGD) WITH PROPOFOL N/A 01/14/2018   Procedure: ESOPHAGOGASTRODUODENOSCOPY (EGD) WITH PROPOFOL;  Surgeon: Toney Reil, MD;  [facility] END[facility]COPY;  Service: Gastroenterology;  Laterality: N/A;  . LAPAROTOMY N/A 02/25/2018   Procedure: EXPLORATORY LAPAROTOMY, PARTMAN'S PROCEDURE, APPENDECTOMY, COLOSTOMY;  Surgeon: Star Age, DO;  Location: [facility] ORS;  Service: General;  Laterality: N/A;  . TONSILLECTOMY    . VISCERAL ANGIOGRAPHY N/A 04/07/2018   Procedure: VISCERAL ANGIOGRAPHY;  Surgeon: Annice Needy, MD;  Location: [facility] INVASIVE CV LAB;  Service: Cardiovascular;  Laterality: N/A;    Prior to Admission medications   Medication Sig Start Date End Date Taking? Authorizing Provider  acetaminophen (TYLENOL) 325 MG tablet Take 650 mg by mouth every 6 (six) hours as needed for mild pain or fever.   Yes [provider]  Ascorbic Acid (VITAMIN C) 1000  MG tablet Take 3,000 mg by mouth daily.   Yes [provider]  Calcium Carb-Cholecalciferol (OYSTER SHELL CALCIUM PLUS D PO) Take 1 tablet by mouth daily.    Yes [provider]  diazepam (VALIUM) 5 MG tablet Take 1 tablet (5 mg total) by mouth 2 (two) times daily. 05/09/18  Yes Crissman, Redge Gainer, MD  diphenoxylate-atropine (LOMOTIL) 2.5-0.025 MG tablet Take 1 tablet by mouth 4 (four) times daily as needed for diarrhea or loose stools. 04/13/18  Yes Auburn Bilberry, MD  loratadine (CLARITIN) 10 MG tablet Take 1 tablet (10 mg total) by mouth daily. 05/20/17  Yes Crissman, Redge Gainer, MD  magnesium 30 MG tablet Take 30 mg by mouth daily.    Yes [provider]  meloxicam (MOBIC) 15 MG tablet Take 15 mg by mouth daily. 05/24/18  Yes [provider]  Multiple Vitamin (MULTIVITAMIN WITH MINERALS) TABS tablet Take 1 tablet by mouth daily. 04/14/18  Yes Auburn Bilberry, MD  ondansetron (ZOFRAN) 4 MG tablet TAKE 1 TABLET BY MOUTH EVERY 8 HOURS AS NEEDED FOR NAUSEA AND VOMITING 06/10/18  Yes [name], MD  pantoprazole (PROTONIX) 40 MG tablet TAKE 1 TABLET BY MOUTH TWICE A DAY 04/07/18  Yes Crissman, Redge Gainer, MD  Selenium 200 MCG CAPS Take by mouth daily.   Yes [provider]  simethicone (GAS-X) 80 MG chewable tablet Chew 1 tablet (80 mg total) by mouth 3 (three) times daily. 04/14/18 07/23/18 Yes Auburn Bilberry, MD  sucralfate (CARAFATE) 1 g tablet Take 1 tablet (1 g total) by mouth 2 (  two) times daily. 06/21/18  Yes [name], MD  vitamin E 1000 UNIT capsule Take 1,000 Units by mouth 2 (two) times daily.   Yes [provider]  Zinc Sulfate (ZINC 15 PO) Take by mouth daily.   Yes [provider]  fexofenadine (ALLEGRA) 180 MG tablet fexofenadine 180 mg tablet  TAKE 1 TABLET (180 MG TOTAL) BY MOUTH DAILY.    [provider]  oxyCODONE (OXY IR/ROXICODONE) 5 MG immediate release tablet Take 1 tablet (5 mg total) by mouth every 4 (four) hours as  needed for severe pain. Patient not taking: Reported on 04/21/2018 04/13/18   Auburn Bilberry, MD  traMADol (ULTRAM) 50 MG tablet TAKE 1 TABLET (50 MG TOTAL) BY MOUTH EVERY 8 (EIGHT) HOURS AS NEEDED. Patient not taking: Reported on 06/24/2018 05/10/18   Steele Sizer, MD    Allergies as of 06/10/2018 - Review Complete 06/10/2018  Allergen Reaction Noted  . Codeine Diarrhea and Nausea And Vomiting 04/30/2015  . Prednisone Other (See Comments) 12/09/2017  . Tape Rash 04/08/2018    Family History  Problem Relation Age of Onset  . Cancer Mother   . AAA (abdominal aortic aneurysm) Father   . Cancer Maternal Grandmother   . Stroke Maternal Grandmother   . Cancer Paternal Grandfather     Social History   Socioeconomic History  . Marital status: Widowed    Spouse name: Not on file  . Number of children: Not on file  . Years of education: Not on file  . Highest education level: Not on file  Occupational History  . Not on file  Social Needs  . Financial resource strain: Not on file  . Food insecurity:    Worry: Not on file    Inability: Not on file  . Transportation needs:    Medical: Not on file    Non-medical: Not on file  Tobacco Use  . Smoking status: Current Every Day Smoker    Packs/day: 0.10    Types: Cigarettes  . Smokeless tobacco: Never Used  Substance and Sexual Activity  . Alcohol use: No  . Drug use: No  . Sexual activity: Not on file  Lifestyle  . Physical activity:    Days per week: Not on file    Minutes per session: Not on file  . Stress: Not on file  Relationships  . Social connections:    Talks on phone: Not on file    Gets together: Not on file    Attends religious service: Not on file    Active member of club or organization: Not on file    Attends meetings of clubs or organizations: Not on file    Relationship status: Not on file  . Intimate partner violence:    Fear of current or ex partner: Not on file    Emotionally abused: Not on file     Physically abused: Not on file    Forced sexual activity: Not on file  Other Topics Concern  . Not on file  Social History Narrative  . Not on file    Review of Systems: See HPI, otherwise negative ROS  Physical Exam: BP (!) 141/93   Pulse (!) 108   Temp (!) 97.5 F (36.4 C) (Tympanic)   Resp 16   Ht 5\' 2"  (1.575 m)   Wt 40.8 kg   SpO2 100%   BMI 16.46 kg/m  General:   Alert,  pleasant and cooperative in NAD Head:  Normocephalic and atraumatic. Neck:  Supple; no masses or thyromegaly. Lungs:  Clear throughout to auscultation, normal respiratory effort.    Heart:  +S1, +S2, Regular rate and rhythm, No edema. Abdomen:  Soft, nontender and nondistended. Normal bowel sounds, without guarding, and without rebound.   Neurologic:  Alert and  oriented x4;  grossly normal neurologically.  Impression/Plan: Annette Miller is here for an endoscopy and colonoscopy  to be performed for  evaluation of duodenal ulcer and diverticulitis    Risks, benefits, limitations, and alternatives regarding endoscopy have been reviewed with the patient.  Questions have been answered.  All parties agreeable.   [name], MD  06/24/2018, 9:33 AM

## 2018-06-24 NOTE — Op Note (Signed)
[facility] Gastroenterology Patient Name: Annette Miller Procedure Date: 06/24/2018 9:36 AM MRN: [id number] Account #: [id number] Date of Birth: 11/23/1950 Admit Type: Outpatient Age: 67 Room: [facility] ENDO ROOM 4 Gender: Female Note Status: Finalized Procedure:            Upper GI endoscopy Indications:          Follow-up of acute duodenal ulcer with hemorrhage and                        obstruction Providers:            [name] MD, MD Medicines:            Monitored Anesthesia Care Complications:        No immediate complications. Procedure:            Pre-Anesthesia Assessment:                       - Prior to the procedure, a History and Physical was                        performed, and patient medications, allergies and                        sensitivities were reviewed. The patient's tolerance of                        previous anesthesia was reviewed.                       - The risks and benefits of the procedure and the                        sedation options and risks were discussed with the                        patient. All questions were answered and informed                        consent was obtained.                       - ASA Grade Assessment: III - A patient with severe                        systemic disease.                       After obtaining informed consent, the endoscope was                        passed under direct vision. Throughout the procedure,                        the patient's blood pressure, pulse, and oxygen                        saturations were monitored continuously. The Endoscope                        was introduced through the mouth, and advanced to the  third part of duodenum. The upper GI endoscopy was                        accomplished with ease. The patient tolerated the                        procedure well. Findings:      The esophagus was normal.      The stomach was normal.      The  examined duodenum was normal. Impression:           - Normal esophagus.                       - Normal stomach.                       - Normal examined duodenum.                       - No specimens collected. Recommendation:       - Perform a colonoscopy today. Procedure Code(s):    --- Professional ---                       [phone], Esophagogastroduodenoscopy, flexible, transoral;                        diagnostic, including collection of specimen(s) by                        brushing or washing, when performed (separate procedure) Diagnosis Code(s):    --- Professional ---                       K26.0, Acute duodenal ulcer with hemorrhage CPT copyright 2017 American Medical Association. All rights reserved. The codes documented in this report are preliminary and upon coder review may  be revised to meet current compliance requirements. [name], MD [name] MD, MD 06/24/2018 9:45:06 AM This report has been signed electronically. Number of Addenda: 0 Note Initiated On: 06/24/2018 9:36 AM      [facility]

## 2018-06-24 NOTE — Anesthesia Post-op Follow-up Note (Signed)
Anesthesia QCDR form completed.        

## 2018-06-24 NOTE — Anesthesia Postprocedure Evaluation (Signed)
Anesthesia Post Note  Patient: CALAYA GILDNER  Procedure(s) Performed: COLONOSCOPY WITH PROPOFOL (N/A ) ESOPHAGOGASTRODUODENOSCOPY (EGD) WITH PROPOFOL (N/A )  Patient location during evaluation: Endoscopy Anesthesia Type: General Level of consciousness: awake and alert Pain management: pain level controlled Vital Signs Assessment: post-procedure vital signs reviewed and stable Respiratory status: spontaneous breathing, nonlabored ventilation, respiratory function stable and patient connected to nasal cannula oxygen Cardiovascular status: blood pressure returned to baseline and stable Postop Assessment: no apparent nausea or vomiting Anesthetic complications: no     Last Vitals:  Vitals:   06/24/18 1013 06/24/18 1031  BP: (!) 146/94 120/70  Pulse:    Resp:    Temp:    SpO2:      Last Pain:  Vitals:   06/24/18 1031  TempSrc:   PainSc: 0-No pain                 Kamaree Wheatley S

## 2018-06-24 NOTE — Op Note (Signed)
[facility] Gastroenterology Patient Name: Annette Miller Procedure Date: 06/24/2018 9:36 AM MRN: [id number] Account #: [id number] Date of Birth: [birth year] Admit Type: Outpatient Age: 67 Room: [facility] ENDO ROOM 4 Gender: Female Note Status: Finalized Procedure:            Colonoscopy Indications:          Preoperative assessment Providers:            [name] MD, MD Medicines:            Monitored Anesthesia Care Complications:        No immediate complications. Procedure:            Pre-Anesthesia Assessment:                       - Prior to the procedure, a History and Physical was                        performed, and patient medications, allergies and                        sensitivities were reviewed. The patient's tolerance of                        previous anesthesia was reviewed.                       - The risks and benefits of the procedure and the                        sedation options and risks were discussed with the                        patient. All questions were answered and informed                        consent was obtained.                       - ASA Grade Assessment: II - A patient with mild                        systemic disease.                       After obtaining informed consent, the colonoscope was                        passed under direct vision. Throughout the procedure,                        the patient's blood pressure, pulse, and oxygen                        saturations were monitored continuously. The                        Colonoscope was introduced through the sigmoid                        colostomy and advanced to the the cecum, identified by  appendiceal orifice and ileocecal valve. The                        colonoscopy was performed without difficulty. The                        patient tolerated the procedure well. The quality of                        the bowel preparation was  poor. Findings:      The perianal and digital rectal examinations were normal.      The colon (entire examined portion) appeared normal.      The retroflexed view of the distal rectum and anal verge was normal and       showed no anal or rectal abnormalities. The prep was poor, inadequate to       look for flat or small polyps but no large lesions seen.Ok to proceed       with revision but will need a colonoscopy in 4 months to check for       polyps with 2 day prep Impression:           - Preparation of the colon was poor.                       - The entire examined colon is normal.                       - The distal rectum and anal verge are normal on                        retroflexion view.                       - No specimens collected. Recommendation:       - Discharge patient to home (with escort).                       - Resume previous diet.                       - Continue present medications.                       - Repeat colonoscopy in 4 months because the bowel                        preparation was suboptimal.                       - The prep was poor, inadequate to look for flat or                        small polyps but no large lesions seen.Ok to proceed                        with revision but will need a colonoscopy in 4 months                        to check for polyps with 2 day prep Procedure Code(s):    --- Professional ---  16109, Colonoscopy through stoma; diagnostic, including                        collection of specimen(s) by brushing or washing, when                        performed (separate procedure) Diagnosis Code(s):    --- Professional ---                       U04.540, Encounter for other preprocedural examination CPT copyright 2017 American Medical Association. All rights reserved. The codes documented in this report are preliminary and upon coder review may  be revised to meet current compliance requirements. [name],  MD [name] MD, MD 06/24/2018 10:12:38 AM This report has been signed electronically. Number of Addenda: 0 Note Initiated On: 06/24/2018 9:36 AM Scope Withdrawal Time: 0 hours 14 minutes 39 seconds  Total Procedure Duration: 0 hours 22 minutes 30 seconds       [facility]

## 2018-06-24 NOTE — Transfer of Care (Signed)
Immediate Anesthesia Transfer of Care Note  Patient: Annette Miller  Procedure(s) Performed: COLONOSCOPY WITH PROPOFOL (N/A ) ESOPHAGOGASTRODUODENOSCOPY (EGD) WITH PROPOFOL (N/A )  Patient Location: PACU  Anesthesia Type:General  Level of Consciousness: sedated  Airway & Oxygen Therapy: Patient Spontanous Breathing and Patient connected to nasal cannula oxygen  Post-op Assessment: Report given to RN and Post -op Vital signs reviewed and stable  Post vital signs: Reviewed  Last Vitals:  Vitals Value Taken Time  BP    Temp 36.1 C 06/24/2018 10:11 AM  Pulse 76 06/24/2018 10:12 AM  Resp 16 06/24/2018 10:12 AM  SpO2 100 % 06/24/2018 10:12 AM  Vitals shown include unvalidated device data.  Last Pain:  Vitals:   06/24/18 1011  TempSrc: Tympanic  PainSc: 0-No pain         Complications: No apparent anesthesia complications

## 2018-06-26 ENCOUNTER — Other Ambulatory Visit: Payer: Self-pay | Admitting: Family Medicine

## 2018-06-27 ENCOUNTER — Encounter: Payer: Self-pay | Admitting: Gastroenterology

## 2018-06-27 ENCOUNTER — Inpatient Hospital Stay: Payer: Self-pay | Admitting: Family Medicine

## 2018-06-27 DIAGNOSIS — N390 Urinary tract infection, site not specified: Secondary | ICD-10-CM | POA: Diagnosis not present

## 2018-06-27 DIAGNOSIS — Z433 Encounter for attention to colostomy: Secondary | ICD-10-CM | POA: Diagnosis not present

## 2018-06-27 DIAGNOSIS — D649 Anemia, unspecified: Secondary | ICD-10-CM | POA: Diagnosis not present

## 2018-06-27 DIAGNOSIS — K219 Gastro-esophageal reflux disease without esophagitis: Secondary | ICD-10-CM | POA: Diagnosis not present

## 2018-06-27 DIAGNOSIS — K260 Acute duodenal ulcer with hemorrhage: Secondary | ICD-10-CM | POA: Diagnosis not present

## 2018-06-27 DIAGNOSIS — K5720 Diverticulitis of large intestine with perforation and abscess without bleeding: Secondary | ICD-10-CM | POA: Diagnosis not present

## 2018-06-27 DIAGNOSIS — E039 Hypothyroidism, unspecified: Secondary | ICD-10-CM | POA: Diagnosis not present

## 2018-06-27 DIAGNOSIS — J449 Chronic obstructive pulmonary disease, unspecified: Secondary | ICD-10-CM | POA: Diagnosis not present

## 2018-06-27 DIAGNOSIS — M810 Age-related osteoporosis without current pathological fracture: Secondary | ICD-10-CM | POA: Diagnosis not present

## 2018-06-27 DIAGNOSIS — Z48815 Encounter for surgical aftercare following surgery on the digestive system: Secondary | ICD-10-CM | POA: Diagnosis not present

## 2018-06-27 NOTE — Telephone Encounter (Signed)
Allegra refill Last refill:05/16/18 by historical provider Last OV:04/21/18; Upcoming 06/30/18 ZOX:[id number] Pharmacy: [id number] - [place], [place] - [address] [phone] (Phone) [phone] (Fax)

## 2018-06-29 DIAGNOSIS — E039 Hypothyroidism, unspecified: Secondary | ICD-10-CM | POA: Diagnosis not present

## 2018-06-29 DIAGNOSIS — N390 Urinary tract infection, site not specified: Secondary | ICD-10-CM | POA: Diagnosis not present

## 2018-06-29 DIAGNOSIS — K260 Acute duodenal ulcer with hemorrhage: Secondary | ICD-10-CM | POA: Diagnosis not present

## 2018-06-29 DIAGNOSIS — Z433 Encounter for attention to colostomy: Secondary | ICD-10-CM | POA: Diagnosis not present

## 2018-06-29 DIAGNOSIS — J449 Chronic obstructive pulmonary disease, unspecified: Secondary | ICD-10-CM | POA: Diagnosis not present

## 2018-06-29 DIAGNOSIS — M810 Age-related osteoporosis without current pathological fracture: Secondary | ICD-10-CM | POA: Diagnosis not present

## 2018-06-29 DIAGNOSIS — K5720 Diverticulitis of large intestine with perforation and abscess without bleeding: Secondary | ICD-10-CM | POA: Diagnosis not present

## 2018-06-29 DIAGNOSIS — D649 Anemia, unspecified: Secondary | ICD-10-CM | POA: Diagnosis not present

## 2018-06-29 DIAGNOSIS — K219 Gastro-esophageal reflux disease without esophagitis: Secondary | ICD-10-CM | POA: Diagnosis not present

## 2018-06-29 DIAGNOSIS — Z48815 Encounter for surgical aftercare following surgery on the digestive system: Secondary | ICD-10-CM | POA: Diagnosis not present

## 2018-06-30 ENCOUNTER — Ambulatory Visit (INDEPENDENT_AMBULATORY_CARE_PROVIDER_SITE_OTHER): Payer: Medicare HMO | Admitting: Family Medicine

## 2018-06-30 ENCOUNTER — Encounter: Payer: Self-pay | Admitting: Family Medicine

## 2018-06-30 DIAGNOSIS — K631 Perforation of intestine (nontraumatic): Secondary | ICD-10-CM

## 2018-06-30 DIAGNOSIS — R69 Illness, unspecified: Secondary | ICD-10-CM | POA: Diagnosis not present

## 2018-06-30 DIAGNOSIS — D649 Anemia, unspecified: Secondary | ICD-10-CM

## 2018-06-30 DIAGNOSIS — F329 Major depressive disorder, single episode, unspecified: Secondary | ICD-10-CM

## 2018-06-30 DIAGNOSIS — F419 Anxiety disorder, unspecified: Secondary | ICD-10-CM | POA: Diagnosis not present

## 2018-06-30 DIAGNOSIS — F32A Depression, unspecified: Secondary | ICD-10-CM

## 2018-06-30 MED ORDER — MELOXICAM 15 MG PO TABS: 15.0000 mg | ORAL_TABLET | Freq: Every day | ORAL | 6 refills | Status: DC

## 2018-06-30 MED ORDER — DIAZEPAM 5 MG PO TABS: 5.0000 mg | ORAL_TABLET | Freq: Two times a day (BID) | ORAL | 5 refills | Status: DC

## 2018-06-30 NOTE — Assessment & Plan Note (Signed)
Patient recovering starting to get weight and get her strength back also.  Reviewed diet exercise nutrition

## 2018-06-30 NOTE — Assessment & Plan Note (Signed)
Resolving with weight gain

## 2018-06-30 NOTE — Assessment & Plan Note (Signed)
Reviewed anxiety care and treatment will continue Valium 5 mg twice a day

## 2018-06-30 NOTE — Progress Notes (Signed)
BP (!) 149/95 (BP Location: Left Arm, Patient Position: Sitting, Cuff Size: Normal)   Pulse 92   Temp 98.9 F (37.2 C)   Ht 5\' 2"  (1.575 m)   Wt 93 lb (42.2 kg)   SpO2 100%   BMI 17.01 kg/m    Subjective:    Patient ID: Annette Miller, female    DOB: 02-Oct-1950, 67 y.o.   MRN: [id number]  HPI: Annette Miller is a 67 y.o. female  Chief Complaint  Patient presents with  . Weight Check  . Medication Refill    diazepam and meloxicam   Patient doing so much better.  Starting to eat feeling better and energy has gone back up during these episodes of acute illness patient lost approximately 50 pounds and has been able to gain back 13 pounds.  Is eating better energy is better still found herself a little clumsy with her balance and strength.  Patient follow-up anxiety is taking Valium 5 mg twice a day this is been long-term patient's usage has not increased or decreased.  Have tried several spells of decreasing medication but has stayed stable on 5 mg twice a day.   Relevant past medical, surgical, family and social history reviewed and updated as indicated. Interim medical history since our last visit reviewed. Allergies and medications reviewed and updated.  Review of Systems  Constitutional: Negative.   Respiratory: Negative.   Cardiovascular: Negative.     Per HPI unless specifically indicated above     Objective:    BP (!) 149/95 (BP Location: Left Arm, Patient Position: Sitting, Cuff Size: Normal)   Pulse 92   Temp 98.9 F (37.2 C)   Ht 5\' 2"  (1.575 m)   Wt 93 lb (42.2 kg)   SpO2 100%   BMI 17.01 kg/m   Wt Readings from Last 3 Encounters:  06/30/18 93 lb (42.2 kg)  06/24/18 90 lb (40.8 kg)  06/10/18 90 lb 3.2 oz (40.9 kg)    Physical Exam  Constitutional: She is oriented to person, place, and time. She appears well-developed and well-nourished.  HENT:  Head: Normocephalic and atraumatic.  Eyes: Conjunctivae and EOM are normal.  Neck: Normal range of  motion.  Cardiovascular: Normal rate, regular rhythm and normal heart sounds.  Pulmonary/Chest: Effort normal and breath sounds normal.  Musculoskeletal: Normal range of motion.  Neurological: She is alert and oriented to person, place, and time.  Skin: No erythema.  Psychiatric: She has a normal mood and affect. Her behavior is normal. Judgment and thought content normal.    Results for orders placed or performed in visit on 06/30/18  CBC and differential  Result Value Ref Range   Hemoglobin 10.8 (A) 12.0 - 16.0   HCT 33 (A) 36 - 46   Platelets 455 (A) 150 - 399   WBC 6.6       Assessment & Plan:   Problem List Items Addressed This Visit      Digestive   Intestinal perforation [facility])    Patient recovering starting to get weight and get her strength back also.  Reviewed diet exercise nutrition        Other   Depression   Relevant Medications   diazepam (VALIUM) 5 MG tablet   Anxiety    Reviewed anxiety care and treatment will continue Valium 5 mg twice a day      Relevant Medications   diazepam (VALIUM) 5 MG tablet   Symptomatic anemia    Resolving with weight  gain          Follow up plan: Return in about 3 months (around 09/30/2018) for Physical Exam.

## 2018-07-01 ENCOUNTER — Other Ambulatory Visit: Payer: Self-pay | Admitting: Family Medicine

## 2018-07-01 DIAGNOSIS — N390 Urinary tract infection, site not specified: Secondary | ICD-10-CM | POA: Diagnosis not present

## 2018-07-01 DIAGNOSIS — F32A Depression, unspecified: Secondary | ICD-10-CM

## 2018-07-01 DIAGNOSIS — Z48815 Encounter for surgical aftercare following surgery on the digestive system: Secondary | ICD-10-CM | POA: Diagnosis not present

## 2018-07-01 DIAGNOSIS — K219 Gastro-esophageal reflux disease without esophagitis: Secondary | ICD-10-CM | POA: Diagnosis not present

## 2018-07-01 DIAGNOSIS — D649 Anemia, unspecified: Secondary | ICD-10-CM | POA: Diagnosis not present

## 2018-07-01 DIAGNOSIS — F329 Major depressive disorder, single episode, unspecified: Secondary | ICD-10-CM

## 2018-07-01 DIAGNOSIS — E039 Hypothyroidism, unspecified: Secondary | ICD-10-CM | POA: Diagnosis not present

## 2018-07-01 DIAGNOSIS — Z433 Encounter for attention to colostomy: Secondary | ICD-10-CM | POA: Diagnosis not present

## 2018-07-01 DIAGNOSIS — J449 Chronic obstructive pulmonary disease, unspecified: Secondary | ICD-10-CM | POA: Diagnosis not present

## 2018-07-01 DIAGNOSIS — K5720 Diverticulitis of large intestine with perforation and abscess without bleeding: Secondary | ICD-10-CM | POA: Diagnosis not present

## 2018-07-01 DIAGNOSIS — M810 Age-related osteoporosis without current pathological fracture: Secondary | ICD-10-CM | POA: Diagnosis not present

## 2018-07-01 DIAGNOSIS — K260 Acute duodenal ulcer with hemorrhage: Secondary | ICD-10-CM | POA: Diagnosis not present

## 2018-07-01 NOTE — Telephone Encounter (Signed)
Requested medication (s) are due for refill today: yes  Requested medication (s) are on the active medication list: yes  Last refill:  06/07/18  Future visit scheduled: yes  Notes to clinic:  undelegated    Requested Prescriptions  Pending Prescriptions Disp Refills   diazepam (VALIUM) 5 MG tablet [Pharmacy Med Name: DIAZEPAM 5 MG TABLET] 60 tablet 1    Sig: TAKE 1 TABLET BY MOUTH TWICE A DAY     Not Delegated - Psychiatry:  Anxiolytics/Hypnotics Failed - 07/01/2018  4:36 AM      Failed - This refill cannot be delegated      Failed - Urine Drug Screen completed in last 360 days.      Passed - Valid encounter within last 6 months    Recent Outpatient Visits          Yesterday Depression, unspecified depression type   [facility], Redge Gainer, MD   2 months ago Diverticulitis of large intestine with perforation with bleeding   [facility] Steele Sizer, MD   6 months ago Hyperlipidemia, unspecified hyperlipidemia type   [facility] Crissman, Redge Gainer, MD   1 year ago Hypothyroidism, unspecified type   [facility] Crissman, Redge Gainer, MD   1 year ago Trapezius muscle spasm   [facility] Crissman, Redge Gainer, MD      Future Appointments            In 1 week Pabon, Merri Ray, MD [facility]   In 1 month [name], MD [facility] [place]   In 1 month Crissman, Redge Gainer, MD [facility]

## 2018-07-04 ENCOUNTER — Other Ambulatory Visit: Payer: Self-pay | Admitting: Family Medicine

## 2018-07-04 DIAGNOSIS — F32A Depression, unspecified: Secondary | ICD-10-CM

## 2018-07-04 DIAGNOSIS — Z433 Encounter for attention to colostomy: Secondary | ICD-10-CM | POA: Diagnosis not present

## 2018-07-04 DIAGNOSIS — M810 Age-related osteoporosis without current pathological fracture: Secondary | ICD-10-CM | POA: Diagnosis not present

## 2018-07-04 DIAGNOSIS — N390 Urinary tract infection, site not specified: Secondary | ICD-10-CM | POA: Diagnosis not present

## 2018-07-04 DIAGNOSIS — E039 Hypothyroidism, unspecified: Secondary | ICD-10-CM | POA: Diagnosis not present

## 2018-07-04 DIAGNOSIS — K5720 Diverticulitis of large intestine with perforation and abscess without bleeding: Secondary | ICD-10-CM | POA: Diagnosis not present

## 2018-07-04 DIAGNOSIS — D649 Anemia, unspecified: Secondary | ICD-10-CM | POA: Diagnosis not present

## 2018-07-04 DIAGNOSIS — Z48815 Encounter for surgical aftercare following surgery on the digestive system: Secondary | ICD-10-CM | POA: Diagnosis not present

## 2018-07-04 DIAGNOSIS — K260 Acute duodenal ulcer with hemorrhage: Secondary | ICD-10-CM | POA: Diagnosis not present

## 2018-07-04 DIAGNOSIS — J449 Chronic obstructive pulmonary disease, unspecified: Secondary | ICD-10-CM | POA: Diagnosis not present

## 2018-07-04 DIAGNOSIS — F329 Major depressive disorder, single episode, unspecified: Secondary | ICD-10-CM

## 2018-07-04 DIAGNOSIS — K219 Gastro-esophageal reflux disease without esophagitis: Secondary | ICD-10-CM | POA: Diagnosis not present

## 2018-07-06 DIAGNOSIS — Z48815 Encounter for surgical aftercare following surgery on the digestive system: Secondary | ICD-10-CM | POA: Diagnosis not present

## 2018-07-06 DIAGNOSIS — N390 Urinary tract infection, site not specified: Secondary | ICD-10-CM | POA: Diagnosis not present

## 2018-07-06 DIAGNOSIS — K5720 Diverticulitis of large intestine with perforation and abscess without bleeding: Secondary | ICD-10-CM | POA: Diagnosis not present

## 2018-07-06 DIAGNOSIS — K260 Acute duodenal ulcer with hemorrhage: Secondary | ICD-10-CM | POA: Diagnosis not present

## 2018-07-06 DIAGNOSIS — J449 Chronic obstructive pulmonary disease, unspecified: Secondary | ICD-10-CM | POA: Diagnosis not present

## 2018-07-06 DIAGNOSIS — K219 Gastro-esophageal reflux disease without esophagitis: Secondary | ICD-10-CM | POA: Diagnosis not present

## 2018-07-06 DIAGNOSIS — D649 Anemia, unspecified: Secondary | ICD-10-CM | POA: Diagnosis not present

## 2018-07-06 DIAGNOSIS — E039 Hypothyroidism, unspecified: Secondary | ICD-10-CM | POA: Diagnosis not present

## 2018-07-06 DIAGNOSIS — M810 Age-related osteoporosis without current pathological fracture: Secondary | ICD-10-CM | POA: Diagnosis not present

## 2018-07-06 DIAGNOSIS — Z433 Encounter for attention to colostomy: Secondary | ICD-10-CM | POA: Diagnosis not present

## 2018-07-08 DIAGNOSIS — J449 Chronic obstructive pulmonary disease, unspecified: Secondary | ICD-10-CM | POA: Diagnosis not present

## 2018-07-08 DIAGNOSIS — Z48815 Encounter for surgical aftercare following surgery on the digestive system: Secondary | ICD-10-CM | POA: Diagnosis not present

## 2018-07-08 DIAGNOSIS — K260 Acute duodenal ulcer with hemorrhage: Secondary | ICD-10-CM | POA: Diagnosis not present

## 2018-07-08 DIAGNOSIS — K219 Gastro-esophageal reflux disease without esophagitis: Secondary | ICD-10-CM | POA: Diagnosis not present

## 2018-07-08 DIAGNOSIS — E039 Hypothyroidism, unspecified: Secondary | ICD-10-CM | POA: Diagnosis not present

## 2018-07-08 DIAGNOSIS — N390 Urinary tract infection, site not specified: Secondary | ICD-10-CM | POA: Diagnosis not present

## 2018-07-08 DIAGNOSIS — D649 Anemia, unspecified: Secondary | ICD-10-CM | POA: Diagnosis not present

## 2018-07-08 DIAGNOSIS — Z433 Encounter for attention to colostomy: Secondary | ICD-10-CM | POA: Diagnosis not present

## 2018-07-08 DIAGNOSIS — M810 Age-related osteoporosis without current pathological fracture: Secondary | ICD-10-CM | POA: Diagnosis not present

## 2018-07-08 DIAGNOSIS — K5720 Diverticulitis of large intestine with perforation and abscess without bleeding: Secondary | ICD-10-CM | POA: Diagnosis not present

## 2018-07-11 ENCOUNTER — Ambulatory Visit (INDEPENDENT_AMBULATORY_CARE_PROVIDER_SITE_OTHER): Payer: Medicare HMO | Admitting: Surgery

## 2018-07-11 ENCOUNTER — Encounter: Payer: Self-pay | Admitting: Surgery

## 2018-07-11 VITALS — BP 172/101 | HR 88 | Temp 97.7°F | Ht 62.0 in | Wt 94.0 lb

## 2018-07-11 DIAGNOSIS — Z9889 Other specified postprocedural states: Secondary | ICD-10-CM | POA: Diagnosis not present

## 2018-07-11 DIAGNOSIS — K5720 Diverticulitis of large intestine with perforation and abscess without bleeding: Secondary | ICD-10-CM

## 2018-07-11 NOTE — Patient Instructions (Addendum)
Patient to stop smoking. Get labs done. Return in one month.

## 2018-07-11 NOTE — Progress Notes (Signed)
Outpatient Surgical Follow Up  07/11/2018  Annette Miller is an 67 y.o. female.   Chief Complaint  Patient presents with  . Other    HPI: Annette Miller is  S/p Hartmann's for perf diverticulitis 5/31 Doing very well now, taking PO, no fevers or chill.s Has some chronic abd pain. Wound still has small opening. She Had an episode of Upper gi bleed from ulcer s/p embolization 03/2018. She continue to smoke about 1/4 pack. Denies any melena or hematemesis Eating better, Doing wet/dry to the wound. Anemic on previous h/h. Gaining weight. Had a colonoscopy that I have personally reviewed images, was suboptimal but she did not have any evidence of large colon masses or lesions. H/H 10.8/33  Past Medical History:  Diagnosis Date  . Allergy   . Anemia   . Anxiety   . Arthritis   . Depression   . Goiter   . Hyperlipidemia   . Hypothyroidism   . Osteoporosis     Past Surgical History:  Procedure Laterality Date  . ABDOMINAL HYSTERECTOMY    . APPENDECTOMY    . COLONOSCOPY WITH PROPOFOL N/A 01/14/2018   Procedure: COLONOSCOPY WITH PROPOFOL;  Surgeon: Toney Reil, MD;  Location: [facility] ENDOSCOPY;  Service: Gastroenterology;  Laterality: N/A;  . COLONOSCOPY WITH PROPOFOL N/A 06/24/2018   Procedure: COLONOSCOPY WITH PROPOFOL;  Surgeon: [name], MD;  Location: [facility] ENDOSCOPY;  Service: Gastroenterology;  Laterality: N/A;  . ESOPHAGOGASTRODUODENOSCOPY N/A 04/06/2018   Procedure: ESOPHAGOGASTRODUODENOSCOPY (EGD);  Surgeon: Toney Reil, MD;  Location: [facility] ENDOSCOPY;  Service: Gastroenterology;  Laterality: N/A;  . ESOPHAGOGASTRODUODENOSCOPY (EGD) WITH PROPOFOL N/A 01/14/2018   Procedure: ESOPHAGOGASTRODUODENOSCOPY (EGD) WITH PROPOFOL;  Surgeon: Toney Reil, MD;  Location: [facility] ENDOSCOPY;  Service: Gastroenterology;  Laterality: N/A;  . ESOPHAGOGASTRODUODENOSCOPY (EGD) WITH PROPOFOL N/A 06/24/2018   Procedure: ESOPHAGOGASTRODUODENOSCOPY (EGD) WITH PROPOFOL;  Surgeon: [name], MD;  Location: [facility] ENDOSCOPY;  Service: Gastroenterology;  Laterality: N/A;  . LAPAROTOMY N/A 02/25/2018   Procedure: EXPLORATORY LAPAROTOMY, PARTMAN'S PROCEDURE, APPENDECTOMY, COLOSTOMY;  Surgeon: Star Age, DO;  Location: [facility] ORS;  Service: General;  Laterality: N/A;  . TONSILLECTOMY    . VISCERAL ANGIOGRAPHY N/A 04/07/2018   Procedure: VISCERAL ANGIOGRAPHY;  Surgeon: Annice Needy, MD;  Location: [facility] INVASIVE CV LAB;  Service: Cardiovascular;  Laterality: N/A;    Family History  Problem Relation Age of Onset  . Cancer Mother   . AAA (abdominal aortic aneurysm) Father   . Cancer Maternal Grandmother   . Stroke Maternal Grandmother   . Cancer Paternal Grandfather     Social History:  reports that she has been smoking cigarettes. She has been smoking about 0.10 packs per day. She has never used smokeless tobacco. She reports that she does not drink alcohol or use drugs.  Allergies:  Allergies  Allergen Reactions  . Prednisone Other (See Comments)    Reaction: violence  . Codeine Diarrhea and Nausea And Vomiting  . Tape Rash    Transpore tape: redness, irritation at site     Medications reviewed.    ROS Full ROS performed and is otherwise negative other than what is stated in HPI   BP (!) 172/101   Pulse 88   Temp 97.7 F (36.5 C) (Skin)   Ht 5\' 2"  (1.575 m)   Wt 94 lb (42.6 kg)   BMI 17.19 kg/m   Physical Exam  Constitutional: She is oriented to person, place, and time. She appears well-developed and well-nourished.  Neck: No JVD present.  No tracheal deviation present. No thyromegaly present.  Pulmonary/Chest: Effort normal. No respiratory distress.  Abdominal: Soft. She exhibits no distension and no mass. There is no tenderness. There is no rebound and no guarding.  3x1 cm open wound healing well, no infection. Colostomy in place. I suspect she does have an incisional hernia as well. No peritonitis.  Musculoskeletal: Normal range of motion. She  exhibits no edema.  Neurological: She is alert and oriented to person, place, and time. She displays normal reflexes. No cranial nerve deficit or sensory deficit. She exhibits normal muscle tone. Coordination normal.  Skin: Skin is warm and dry.  Psychiatric: She has a normal mood and affect. Her behavior is normal. Judgment and thought content normal.  Nursing note and vitals reviewed.   Assessment/Plan:  67 year old female status post Hartman's procedure few months ago , doing much better.  She is still malnourished anemic.  She continues to smoke.  Had a lengthy discussion with the patient about preoperative optimization to include improvement in nutritional status and smoking cessation.  We will order nutritional labs and I will see her back in about a month at that time if she continues to improve she might be able to be scheduled for a colostomy takedown - Comprehensive metabolic panel; Future - CBC with Differential/Platelet; Future - Magnesium; Future - Phosphorus; Future - Phosphorus - Magnesium - CBC with Differential/Platelet - Comprehensive metabolic panel    Greater than 50% of the 25 minutes  visit was spent in counseling/coordination of care   Sterling Big, MD [facility] General Surgeon

## 2018-07-12 ENCOUNTER — Telehealth: Payer: Self-pay | Admitting: Family Medicine

## 2018-07-12 DIAGNOSIS — K260 Acute duodenal ulcer with hemorrhage: Secondary | ICD-10-CM | POA: Diagnosis not present

## 2018-07-12 DIAGNOSIS — E039 Hypothyroidism, unspecified: Secondary | ICD-10-CM | POA: Diagnosis not present

## 2018-07-12 DIAGNOSIS — K219 Gastro-esophageal reflux disease without esophagitis: Secondary | ICD-10-CM | POA: Diagnosis not present

## 2018-07-12 DIAGNOSIS — Z48815 Encounter for surgical aftercare following surgery on the digestive system: Secondary | ICD-10-CM | POA: Diagnosis not present

## 2018-07-12 DIAGNOSIS — N390 Urinary tract infection, site not specified: Secondary | ICD-10-CM | POA: Diagnosis not present

## 2018-07-12 DIAGNOSIS — D649 Anemia, unspecified: Secondary | ICD-10-CM | POA: Diagnosis not present

## 2018-07-12 DIAGNOSIS — K5720 Diverticulitis of large intestine with perforation and abscess without bleeding: Secondary | ICD-10-CM | POA: Diagnosis not present

## 2018-07-12 DIAGNOSIS — M810 Age-related osteoporosis without current pathological fracture: Secondary | ICD-10-CM | POA: Diagnosis not present

## 2018-07-12 DIAGNOSIS — J449 Chronic obstructive pulmonary disease, unspecified: Secondary | ICD-10-CM | POA: Diagnosis not present

## 2018-07-12 DIAGNOSIS — Z433 Encounter for attention to colostomy: Secondary | ICD-10-CM | POA: Diagnosis not present

## 2018-07-12 LAB — COMPREHENSIVE METABOLIC PANEL
ALBUMIN: 4.4 g/dL (ref 3.6–4.8)
ALK PHOS: 103 IU/L (ref 39–117)
ALT: 14 IU/L (ref 0–32)
AST: 20 IU/L (ref 0–40)
Albumin/Globulin Ratio: 1.5 (ref 1.2–2.2)
BILIRUBIN TOTAL: 0.2 mg/dL (ref 0.0–1.2)
BUN / CREAT RATIO: 24 (ref 12–28)
BUN: 22 mg/dL (ref 8–27)
CO2: 23 mmol/L (ref 20–29)
Calcium: 10.2 mg/dL (ref 8.7–10.3)
Chloride: 101 mmol/L (ref 96–106)
Creatinine, Ser: 0.91 mg/dL (ref 0.57–1.00)
GFR calc Af Amer: 76 mL/min/{1.73_m2} (ref >59)
GFR calc non Af Amer: 65 mL/min/{1.73_m2} (ref >59)
Globulin, Total: 3 g/dL (ref 1.5–4.5)
Glucose: 90 mg/dL (ref 65–99)
POTASSIUM: 5.1 mmol/L (ref 3.5–5.2)
Sodium: 143 mmol/L (ref 134–144)
Total Protein: 7.4 g/dL (ref 6.0–8.5)

## 2018-07-12 LAB — CBC WITH DIFFERENTIAL/PLATELET
BASOS: 1 %
Basophils Absolute: 0 10*3/uL (ref 0.0–0.2)
EOS (ABSOLUTE): 0.5 10*3/uL — ABNORMAL HIGH (ref 0.0–0.4)
EOS: 9 %
HEMATOCRIT: 35.5 % (ref 34.0–46.6)
HEMOGLOBIN: 12 g/dL (ref 11.1–15.9)
IMMATURE GRANULOCYTES: 0 %
Immature Grans (Abs): 0 10*3/uL (ref 0.0–0.1)
LYMPHS ABS: 1.7 10*3/uL (ref 0.7–3.1)
Lymphs: 30 %
MCH: 35.2 pg — AB (ref 26.6–33.0)
MCHC: 33.8 g/dL (ref 31.5–35.7)
MCV: 104 fL — ABNORMAL HIGH (ref 79–97)
Monocytes Absolute: 0.3 10*3/uL (ref 0.1–0.9)
Monocytes: 5 %
Neutrophils Absolute: 3.1 10*3/uL (ref 1.4–7.0)
Neutrophils: 55 %
Platelets: 448 10*3/uL (ref 150–450)
RBC: 3.41 x10E6/uL — ABNORMAL LOW (ref 3.77–5.28)
RDW: 13.5 % (ref 12.3–15.4)
WBC: 5.6 10*3/uL (ref 3.4–10.8)

## 2018-07-12 LAB — MAGNESIUM: Magnesium: 1.9 mg/dL (ref 1.6–2.3)

## 2018-07-12 LAB — PHOSPHORUS: Phosphorus: 4.5 mg/dL (ref 2.5–4.5)

## 2018-07-12 NOTE — Telephone Encounter (Signed)
Copied from CRM [id number]. Topic: Quick Communication - See Telephone Encounter >> Jul 12, 2018  1:17 PM Windy Kalata, NT wrote: CRM for notification. See Telephone encounter for: 07/12/18.  Patient is calling and requesting a nurse to give her a call back in regards to getting her Wellbutrin 180mg  2x a day refilled as she can now get it for $14.  [facility] [address]), [place] - [address] [place] ([place]) [place] [address] Phone: [phone] Fax: [phone]

## 2018-07-13 MED ORDER — BUPROPION HCL ER (SR) 150 MG PO TB12: 150.0000 mg | ORAL_TABLET | Freq: Two times a day (BID) | ORAL | 6 refills | Status: DC

## 2018-07-15 DIAGNOSIS — M810 Age-related osteoporosis without current pathological fracture: Secondary | ICD-10-CM | POA: Diagnosis not present

## 2018-07-15 DIAGNOSIS — Z433 Encounter for attention to colostomy: Secondary | ICD-10-CM | POA: Diagnosis not present

## 2018-07-15 DIAGNOSIS — K260 Acute duodenal ulcer with hemorrhage: Secondary | ICD-10-CM | POA: Diagnosis not present

## 2018-07-15 DIAGNOSIS — K219 Gastro-esophageal reflux disease without esophagitis: Secondary | ICD-10-CM | POA: Diagnosis not present

## 2018-07-15 DIAGNOSIS — Z48815 Encounter for surgical aftercare following surgery on the digestive system: Secondary | ICD-10-CM | POA: Diagnosis not present

## 2018-07-15 DIAGNOSIS — D649 Anemia, unspecified: Secondary | ICD-10-CM | POA: Diagnosis not present

## 2018-07-15 DIAGNOSIS — N390 Urinary tract infection, site not specified: Secondary | ICD-10-CM | POA: Diagnosis not present

## 2018-07-15 DIAGNOSIS — K5720 Diverticulitis of large intestine with perforation and abscess without bleeding: Secondary | ICD-10-CM | POA: Diagnosis not present

## 2018-07-15 DIAGNOSIS — J449 Chronic obstructive pulmonary disease, unspecified: Secondary | ICD-10-CM | POA: Diagnosis not present

## 2018-07-15 DIAGNOSIS — E039 Hypothyroidism, unspecified: Secondary | ICD-10-CM | POA: Diagnosis not present

## 2018-07-18 ENCOUNTER — Telehealth: Payer: Self-pay

## 2018-07-18 NOTE — Telephone Encounter (Signed)
Left message stating labs are normal.

## 2018-07-21 DIAGNOSIS — Z433 Encounter for attention to colostomy: Secondary | ICD-10-CM | POA: Diagnosis not present

## 2018-07-22 DIAGNOSIS — K219 Gastro-esophageal reflux disease without esophagitis: Secondary | ICD-10-CM | POA: Diagnosis not present

## 2018-07-22 DIAGNOSIS — K5720 Diverticulitis of large intestine with perforation and abscess without bleeding: Secondary | ICD-10-CM | POA: Diagnosis not present

## 2018-07-22 DIAGNOSIS — M810 Age-related osteoporosis without current pathological fracture: Secondary | ICD-10-CM | POA: Diagnosis not present

## 2018-07-22 DIAGNOSIS — K260 Acute duodenal ulcer with hemorrhage: Secondary | ICD-10-CM | POA: Diagnosis not present

## 2018-07-22 DIAGNOSIS — J449 Chronic obstructive pulmonary disease, unspecified: Secondary | ICD-10-CM | POA: Diagnosis not present

## 2018-07-22 DIAGNOSIS — Z433 Encounter for attention to colostomy: Secondary | ICD-10-CM | POA: Diagnosis not present

## 2018-07-22 DIAGNOSIS — D649 Anemia, unspecified: Secondary | ICD-10-CM | POA: Diagnosis not present

## 2018-07-22 DIAGNOSIS — E039 Hypothyroidism, unspecified: Secondary | ICD-10-CM | POA: Diagnosis not present

## 2018-07-22 DIAGNOSIS — Z48815 Encounter for surgical aftercare following surgery on the digestive system: Secondary | ICD-10-CM | POA: Diagnosis not present

## 2018-07-22 DIAGNOSIS — N390 Urinary tract infection, site not specified: Secondary | ICD-10-CM | POA: Diagnosis not present

## 2018-07-26 DIAGNOSIS — Z433 Encounter for attention to colostomy: Secondary | ICD-10-CM | POA: Diagnosis not present

## 2018-07-26 DIAGNOSIS — N390 Urinary tract infection, site not specified: Secondary | ICD-10-CM | POA: Diagnosis not present

## 2018-07-26 DIAGNOSIS — Z48815 Encounter for surgical aftercare following surgery on the digestive system: Secondary | ICD-10-CM | POA: Diagnosis not present

## 2018-07-26 DIAGNOSIS — K5720 Diverticulitis of large intestine with perforation and abscess without bleeding: Secondary | ICD-10-CM | POA: Diagnosis not present

## 2018-07-26 DIAGNOSIS — K260 Acute duodenal ulcer with hemorrhage: Secondary | ICD-10-CM | POA: Diagnosis not present

## 2018-07-26 DIAGNOSIS — D649 Anemia, unspecified: Secondary | ICD-10-CM | POA: Diagnosis not present

## 2018-07-26 DIAGNOSIS — K219 Gastro-esophageal reflux disease without esophagitis: Secondary | ICD-10-CM | POA: Diagnosis not present

## 2018-07-26 DIAGNOSIS — E039 Hypothyroidism, unspecified: Secondary | ICD-10-CM | POA: Diagnosis not present

## 2018-07-26 DIAGNOSIS — M810 Age-related osteoporosis without current pathological fracture: Secondary | ICD-10-CM | POA: Diagnosis not present

## 2018-07-26 DIAGNOSIS — J449 Chronic obstructive pulmonary disease, unspecified: Secondary | ICD-10-CM | POA: Diagnosis not present

## 2018-07-29 DIAGNOSIS — M810 Age-related osteoporosis without current pathological fracture: Secondary | ICD-10-CM | POA: Diagnosis not present

## 2018-07-29 DIAGNOSIS — Z48815 Encounter for surgical aftercare following surgery on the digestive system: Secondary | ICD-10-CM | POA: Diagnosis not present

## 2018-07-29 DIAGNOSIS — N390 Urinary tract infection, site not specified: Secondary | ICD-10-CM | POA: Diagnosis not present

## 2018-07-29 DIAGNOSIS — K5720 Diverticulitis of large intestine with perforation and abscess without bleeding: Secondary | ICD-10-CM | POA: Diagnosis not present

## 2018-07-29 DIAGNOSIS — Z433 Encounter for attention to colostomy: Secondary | ICD-10-CM | POA: Diagnosis not present

## 2018-07-29 DIAGNOSIS — E039 Hypothyroidism, unspecified: Secondary | ICD-10-CM | POA: Diagnosis not present

## 2018-07-29 DIAGNOSIS — K260 Acute duodenal ulcer with hemorrhage: Secondary | ICD-10-CM | POA: Diagnosis not present

## 2018-07-29 DIAGNOSIS — D649 Anemia, unspecified: Secondary | ICD-10-CM | POA: Diagnosis not present

## 2018-07-29 DIAGNOSIS — J449 Chronic obstructive pulmonary disease, unspecified: Secondary | ICD-10-CM | POA: Diagnosis not present

## 2018-07-29 DIAGNOSIS — K219 Gastro-esophageal reflux disease without esophagitis: Secondary | ICD-10-CM | POA: Diagnosis not present

## 2018-08-05 DIAGNOSIS — M810 Age-related osteoporosis without current pathological fracture: Secondary | ICD-10-CM | POA: Diagnosis not present

## 2018-08-05 DIAGNOSIS — K260 Acute duodenal ulcer with hemorrhage: Secondary | ICD-10-CM | POA: Diagnosis not present

## 2018-08-05 DIAGNOSIS — N390 Urinary tract infection, site not specified: Secondary | ICD-10-CM | POA: Diagnosis not present

## 2018-08-05 DIAGNOSIS — K5720 Diverticulitis of large intestine with perforation and abscess without bleeding: Secondary | ICD-10-CM | POA: Diagnosis not present

## 2018-08-05 DIAGNOSIS — J449 Chronic obstructive pulmonary disease, unspecified: Secondary | ICD-10-CM | POA: Diagnosis not present

## 2018-08-05 DIAGNOSIS — Z433 Encounter for attention to colostomy: Secondary | ICD-10-CM | POA: Diagnosis not present

## 2018-08-05 DIAGNOSIS — D649 Anemia, unspecified: Secondary | ICD-10-CM | POA: Diagnosis not present

## 2018-08-05 DIAGNOSIS — K219 Gastro-esophageal reflux disease without esophagitis: Secondary | ICD-10-CM | POA: Diagnosis not present

## 2018-08-05 DIAGNOSIS — E039 Hypothyroidism, unspecified: Secondary | ICD-10-CM | POA: Diagnosis not present

## 2018-08-05 DIAGNOSIS — Z48815 Encounter for surgical aftercare following surgery on the digestive system: Secondary | ICD-10-CM | POA: Diagnosis not present

## 2018-08-08 DIAGNOSIS — Z433 Encounter for attention to colostomy: Secondary | ICD-10-CM | POA: Diagnosis not present

## 2018-08-09 ENCOUNTER — Other Ambulatory Visit: Payer: Self-pay | Admitting: Family Medicine

## 2018-08-09 NOTE — Telephone Encounter (Signed)
Requested Prescriptions  Pending Prescriptions Disp Refills  . pantoprazole (PROTONIX) 40 MG tablet [Pharmacy Med Name: PANTOPRAZOLE SOD DR 40 MG TAB] 60 tablet 2    Sig: TAKE 1 TABLET BY MOUTH TWICE A DAY     Gastroenterology: Proton Pump Inhibitors Passed - 08/09/2018  1:36 AM      Passed - Valid encounter within last 12 months    Recent Outpatient Visits          1 month ago Depression, unspecified depression type   [facility] Redge GainerMark A, MD   3 months ago Diverticulitis of large intestine with perforation with bleeding   [facility] Redge GainerMark A, MD   7 months ago Hyperlipidemia, unspecified hyperlipidemia type   [facility] Redge GainerMark A, MD   1 year ago Hypothyroidism, unspecified type   [facility] Redge GainerMark A, MD   1 year ago Trapezius muscle spasm   [facility] Crissman, Redge GainerMark A, MD      Future Appointments            In 2 weeks [facility]ainerMark A[facility]   In 2 weeks Wyline MoodAnna, Kiran, MD [facility] [place]

## 2018-08-10 ENCOUNTER — Ambulatory Visit (INDEPENDENT_AMBULATORY_CARE_PROVIDER_SITE_OTHER): Payer: Medicare HMO | Admitting: Surgery

## 2018-08-10 ENCOUNTER — Encounter: Payer: Self-pay | Admitting: Surgery

## 2018-08-10 ENCOUNTER — Ambulatory Visit: Payer: Medicare HMO | Admitting: Gastroenterology

## 2018-08-10 ENCOUNTER — Other Ambulatory Visit: Payer: Self-pay

## 2018-08-10 ENCOUNTER — Telehealth: Payer: Self-pay | Admitting: *Deleted

## 2018-08-10 VITALS — BP 128/74 | HR 90 | Temp 97.7°F | Resp 13 | Ht 62.0 in | Wt 99.0 lb

## 2018-08-10 DIAGNOSIS — N390 Urinary tract infection, site not specified: Secondary | ICD-10-CM | POA: Diagnosis not present

## 2018-08-10 DIAGNOSIS — Z9889 Other specified postprocedural states: Secondary | ICD-10-CM | POA: Diagnosis not present

## 2018-08-10 DIAGNOSIS — K5720 Diverticulitis of large intestine with perforation and abscess without bleeding: Secondary | ICD-10-CM | POA: Diagnosis not present

## 2018-08-10 DIAGNOSIS — D649 Anemia, unspecified: Secondary | ICD-10-CM | POA: Diagnosis not present

## 2018-08-10 DIAGNOSIS — J449 Chronic obstructive pulmonary disease, unspecified: Secondary | ICD-10-CM | POA: Diagnosis not present

## 2018-08-10 DIAGNOSIS — E039 Hypothyroidism, unspecified: Secondary | ICD-10-CM | POA: Diagnosis not present

## 2018-08-10 DIAGNOSIS — Z48815 Encounter for surgical aftercare following surgery on the digestive system: Secondary | ICD-10-CM | POA: Diagnosis not present

## 2018-08-10 DIAGNOSIS — Z433 Encounter for attention to colostomy: Secondary | ICD-10-CM | POA: Diagnosis not present

## 2018-08-10 DIAGNOSIS — K219 Gastro-esophageal reflux disease without esophagitis: Secondary | ICD-10-CM | POA: Diagnosis not present

## 2018-08-10 DIAGNOSIS — M810 Age-related osteoporosis without current pathological fracture: Secondary | ICD-10-CM | POA: Diagnosis not present

## 2018-08-10 DIAGNOSIS — K260 Acute duodenal ulcer with hemorrhage: Secondary | ICD-10-CM | POA: Diagnosis not present

## 2018-08-10 MED ORDER — POLYETHYLENE GLYCOL 3350 17 GM/SCOOP PO POWD: 1.0000 | Freq: Once | ORAL | 0 refills | Status: AC

## 2018-08-10 MED ORDER — ERYTHROMYCIN BASE 500 MG PO TABS: ORAL_TABLET | ORAL | 0 refills | Status: DC

## 2018-08-10 MED ORDER — NEOMYCIN SULFATE 500 MG PO TABS: ORAL_TABLET | ORAL | 0 refills | Status: DC

## 2018-08-10 MED ORDER — BISACODYL 5 MG PO TBEC: DELAYED_RELEASE_TABLET | ORAL | 0 refills | Status: DC

## 2018-08-10 NOTE — Patient Instructions (Addendum)
Patient to be scheduled for colostomy takedown. The patient is aware to call back for any questions or concerns.  

## 2018-08-10 NOTE — Telephone Encounter (Signed)
Patient called the office back and surgery date was confirmed.  She is aware of Pre-admit appointment on 08-19-18 at 7:30 am.  The patient is aware to call the office should she have further questions. She verbalizes understanding.

## 2018-08-10 NOTE — Telephone Encounter (Signed)
Message left for patient to call the office.   [facility] Dr[facility] Everlene FarrierPabon.   We need to inform patient of Pre-admit office appointment for 08-19-18 at 7:30 am ([facility] floor[address]).

## 2018-08-10 NOTE — Telephone Encounter (Signed)
Dr. Thelma Bargeaks will be assisting Dr. Everlene FarrierPabon with this case on 08-30-18.

## 2018-08-11 NOTE — H&P (View-Only) (Signed)
Outpatient Surgical Follow Up  08/11/2018  Annette Miller is an 67 y.o. female.   Chief Complaint  Patient presents with  . Follow-up    diverticulitis    HPI: Is a 67-year-old female well-known to our practice with a history of prior perforated colitis requiring Hartman's procedure several months ago.  Issue was being malnourished and active smoking.  Has quit smoking for the last 3 to 4 weeks.  She is eating and drinking well and her repeat labs show significant improvement in nutritional labs and status She is able to perform more than 4 mets of activity without any shortness of breath or chest pain.  She is gaining weight and her colostomy is working well.   Past Medical History:  Diagnosis Date  . Allergy   . Anemia   . Anxiety   . Arthritis   . Depression   . Goiter   . Hyperlipidemia   . Hypothyroidism   . Osteoporosis     Past Surgical History:  Procedure Laterality Date  . ABDOMINAL HYSTERECTOMY    . APPENDECTOMY    . COLONOSCOPY WITH PROPOFOL N/A 01/14/2018   Procedure: COLONOSCOPY WITH PROPOFOL;  Surgeon: Vanga, Rohini Reddy, MD;  Location: [facility] ENDOSCOPY;  Service: Gastroenterology;  Laterality: N/A;  . COLONOSCOPY WITH PROPOFOL N/A 06/24/2018   Procedure: COLONOSCOPY WITH PROPOFOL;  Surgeon: Anna, Kiran, MD;  Location: [facility] ENDOSCOPY;  Service: Gastroenterology;  Laterality: N/A;  . ESOPHAGOGASTRODUODENOSCOPY N/A 04/06/2018   Procedure: ESOPHAGOGASTRODUODENOSCOPY (EGD);  Surgeon: Vanga, Rohini Reddy, MD;  Location: [facility] ENDOSCOPY;  Service: Gastroenterology;  Laterality: N/A;  . ESOPHAGOGASTRODUODENOSCOPY (EGD) WITH PROPOFOL N/A 01/14/2018   Procedure: ESOPHAGOGASTRODUODENOSCOPY (EGD) WITH PROPOFOL;  Surgeon: Vanga, Rohini Reddy, MD;  Location: [facility] ENDOSCOPY;  Service: Gastroenterology;  Laterality: N/A;  . ESOPHAGOGASTRODUODENOSCOPY (EGD) WITH PROPOFOL N/A 06/24/2018   Procedure: ESOPHAGOGASTRODUODENOSCOPY (EGD) WITH PROPOFOL;  Surgeon: Anna, Kiran, MD;  Location:  [facility] ENDOSCOPY;  Service: Gastroenterology;  Laterality: N/A;  . LAPAROTOMY N/A 02/25/2018   Procedure: EXPLORATORY LAPAROTOMY, PARTMAN'S PROCEDURE, APPENDECTOMY, COLOSTOMY;  Surgeon: Kaplin, Aviva W, DO;  Location: [facility] ORS;  Service: General;  Laterality: N/A;  . TONSILLECTOMY    . VISCERAL ANGIOGRAPHY N/A 04/07/2018   Procedure: VISCERAL ANGIOGRAPHY;  Surgeon: Dew, Jason S, MD;  Location: [facility] INVASIVE CV LAB;  Service: Cardiovascular;  Laterality: N/A;    Family History  Problem Relation Age of Onset  . Cancer Mother   . AAA (abdominal aortic aneurysm) Father   . Cancer Maternal Grandmother   . Stroke Maternal Grandmother   . Cancer Paternal Grandfather     Social History:  reports that she quit smoking about 3 weeks ago. Her smoking use included cigarettes. She smoked 0.10 packs per day. She has never used smokeless tobacco. She reports that she does not drink alcohol or use drugs.  Allergies:  Allergies  Allergen Reactions  . Prednisone Other (See Comments)    Reaction: violence  . Codeine Diarrhea and Nausea And Vomiting  . Tape Rash    Transpore tape: redness, irritation at site     Medications reviewed.    ROS Full ROS performed and is otherwise negative other than what is stated in HPI   BP 128/74   Pulse 90   Temp 97.7 F (36.5 C) (Skin)   Resp 13   Ht 5' 2" (1.575 m)   Wt 99 lb (44.9 kg)   BMI 18.11 kg/m   Physical Exam  Constitutional: She is oriented to person, place, and time. She appears   well-developed and well-nourished. No distress.  Neck: Normal range of motion. Neck supple. No JVD present. No tracheal deviation present. No thyromegaly present.  Cardiovascular: Normal rate, regular rhythm and normal heart sounds.  Pulmonary/Chest: Effort normal and breath sounds normal. No respiratory distress.  Abdominal: Soft. She exhibits no distension and no mass. There is no tenderness. There is no rebound and no guarding.  Open wound midline only  A few mms.  Ventral hernia  Musculoskeletal: Normal range of motion. She exhibits no edema or deformity.  Neurological: She is alert and oriented to person, place, and time.  Skin: Skin is warm and dry. Capillary refill takes less than 2 seconds. She is not diaphoretic.  Psychiatric: She has a normal mood and affect. Her behavior is normal. Judgment and thought content normal.       Assessment/Plan: 67-year-old female with history of perforated diverticulitis status post Hartman's.  I do think that now the patient is medically optimized.  Obviously I rather wait about 3 to 4 weeks before doing the colostomy takedown to prevent any more potential complications from recent smoking cessation.  I had a lengthy discussion with the patient about the procedure.  Risk benefit and possible complications including but not limited to: Bleeding, chronic pain, anastomotic leak, the need for another ostomy.  She does have a ventral hernia that that will address at the same time. Greater than 50% of the 25 minutes  visit was spent in counseling/coordination of care   Loris Winrow, MD FACS General Surgeon 

## 2018-08-11 NOTE — Progress Notes (Signed)
Outpatient Surgical Follow Up  08/11/2018  Annette Miller is an 67 y.o. female.   Chief Complaint  Patient presents with  . Follow-up    diverticulitis    HPI: Is a 67 year old female well-known to our practice with a history of prior perforated colitis requiring Hartman's procedure several months ago.  Issue was being malnourished and active smoking.  Has quit smoking for the last 3 to 4 weeks.  She is eating and drinking well and her repeat labs show significant improvement in nutritional labs and status She is able to perform more than 4 mets of activity without any shortness of breath or chest pain.  She is gaining weight and her colostomy is working well.   Past Medical History:  Diagnosis Date  . Allergy   . Anemia   . Anxiety   . Arthritis   . Depression   . Goiter   . Hyperlipidemia   . Hypothyroidism   . Osteoporosis     Past Surgical History:  Procedure Laterality Date  . ABDOMINAL HYSTERECTOMY    . APPENDECTOMY    . COLONOSCOPY WITH PROPOFOL N/A 01/14/2018   Procedure: COLONOSCOPY WITH PROPOFOL;  Surgeon: Toney ReilVanga, Rohini Reddy, MD;  [facility];  Service: Gastroenterology;  Laterality: N/A;  . COLONOSCOPY WITH PROPOFOL N/A 06/24/2018   Procedure: COLONOSCOPY WITH PROPOFOL;  Surgeon: Wyline MoodAnna, Kiran, MD;  [facility];  Service: Gastroenterology;  Laterality: N/A;  . ESOPHAGOGASTRODUODENOSCOPY N/A 04/06/2018   Procedure: ESOPHAGOGASTRODUODENOSCOPY (EGD);  Surgeon: Toney ReilVanga, Rohini Reddy, MD;  [facility];  Service: Gastroenterology;  Laterality: N/A;  . ESOPHAGOGASTRODUODENOSCOPY (EGD) WITH PROPOFOL N/A 01/14/2018   Procedure: ESOPHAGOGASTRODUODENOSCOPY (EGD) WITH PROPOFOL;  Surgeon: Toney ReilVanga, Rohini Reddy, MD;  [facility];  Service: Gastroenterology;  Laterality: N/A;  . ESOPHAGOGASTRODUODENOSCOPY (EGD) WITH PROPOFOL N/A 06/24/2018   Procedure: ESOPHAGOGASTRODUODENOSCOPY (EGD) WITH PROPOFOL;  Surgeon: Wyline MoodAnna, Kiran, MD;  [facility];  Service: Gastroenterology;  Laterality: N/A;  . LAPAROTOMY N/A 02/25/2018   Procedure: EXPLORATORY LAPAROTOMY, PARTMAN'S PROCEDURE, APPENDECTOMY, COLOSTOMY;  Surgeon: Star AgeKaplin, Aviva W, DO;  Location: [facility] ORS;  Service: General;  Laterality: N/A;  . TONSILLECTOMY    . VISCERAL ANGIOGRAPHY N/A 04/07/2018   Procedure: VISCERAL ANGIOGRAPHY;  Surgeon: Annice Needyew, Jason S, MD;  Location: [facility] INVASIVE CV LAB;  Service: Cardiovascular;  Laterality: N/A;    Family History  Problem Relation Age of Onset  . Cancer Mother   . AAA (abdominal aortic aneurysm) Father   . Cancer Maternal Grandmother   . Stroke Maternal Grandmother   . Cancer Paternal Grandfather     Social History:  reports that she quit smoking about 3 weeks ago. Her smoking use included cigarettes. She smoked 0.10 packs per day. She has never used smokeless tobacco. She reports that she does not drink alcohol or use drugs.  Allergies:  Allergies  Allergen Reactions  . Prednisone Other (See Comments)    Reaction: violence  . Codeine Diarrhea and Nausea And Vomiting  . Tape Rash    Transpore tape: redness, irritation at site     Medications reviewed.    ROS Full ROS performed and is otherwise negative other than what is stated in HPI   BP 128/74   Pulse 90   Temp 97.7 F (36.5 C) (Skin)   Resp 13   Ht 5\' 2"  (1.575 m)   Wt 99 lb (44.9 kg)   BMI 18.11 kg/m   Physical Exam  Constitutional: She is oriented to person, place, and time. She appears  well-developed and well-nourished. No distress.  Neck: Normal range of motion. Neck supple. No JVD present. No tracheal deviation present. No thyromegaly present.  Cardiovascular: Normal rate, regular rhythm and normal heart sounds.  Pulmonary/Chest: Effort normal and breath sounds normal. No respiratory distress.  Abdominal: Soft. She exhibits no distension and no mass. There is no tenderness. There is no rebound and no guarding.  Open wound midline only  A few mms.  Ventral hernia  Musculoskeletal: Normal range of motion. She exhibits no edema or deformity.  Neurological: She is alert and oriented to person, place, and time.  Skin: Skin is warm and dry. Capillary refill takes less than 2 seconds. She is not diaphoretic.  Psychiatric: She has a normal mood and affect. Her behavior is normal. Judgment and thought content normal.       Assessment/Plan: 67 year old female with history of perforated diverticulitis status post Hartman's.  I do think that now the patient is medically optimized.  Obviously I rather wait about 3 to 4 weeks before doing the colostomy takedown to prevent any more potential complications from recent smoking cessation.  I had a lengthy discussion with the patient about the procedure.  Risk benefit and possible complications including but not limited to: Bleeding, chronic pain, anastomotic leak, the need for another ostomy.  She does have a ventral hernia that that will address at the same time. Greater than 50% of the 25 minutes  visit was spent in counseling/coordination of care   Sterling Big, MD [facility] Sur[facility]

## 2018-08-13 ENCOUNTER — Other Ambulatory Visit: Payer: Self-pay | Admitting: Family Medicine

## 2018-08-13 DIAGNOSIS — E7800 Pure hypercholesterolemia, unspecified: Secondary | ICD-10-CM

## 2018-08-14 ENCOUNTER — Other Ambulatory Visit: Payer: Self-pay | Admitting: Gastroenterology

## 2018-08-16 ENCOUNTER — Other Ambulatory Visit: Payer: Self-pay | Admitting: Gastroenterology

## 2018-08-16 DIAGNOSIS — K219 Gastro-esophageal reflux disease without esophagitis: Secondary | ICD-10-CM | POA: Diagnosis not present

## 2018-08-16 DIAGNOSIS — K260 Acute duodenal ulcer with hemorrhage: Secondary | ICD-10-CM | POA: Diagnosis not present

## 2018-08-16 DIAGNOSIS — Z433 Encounter for attention to colostomy: Secondary | ICD-10-CM | POA: Diagnosis not present

## 2018-08-16 DIAGNOSIS — Z48815 Encounter for surgical aftercare following surgery on the digestive system: Secondary | ICD-10-CM | POA: Diagnosis not present

## 2018-08-16 DIAGNOSIS — M810 Age-related osteoporosis without current pathological fracture: Secondary | ICD-10-CM | POA: Diagnosis not present

## 2018-08-16 DIAGNOSIS — K269 Duodenal ulcer, unspecified as acute or chronic, without hemorrhage or perforation: Secondary | ICD-10-CM

## 2018-08-16 DIAGNOSIS — E039 Hypothyroidism, unspecified: Secondary | ICD-10-CM | POA: Diagnosis not present

## 2018-08-16 DIAGNOSIS — K5720 Diverticulitis of large intestine with perforation and abscess without bleeding: Secondary | ICD-10-CM | POA: Diagnosis not present

## 2018-08-16 DIAGNOSIS — N390 Urinary tract infection, site not specified: Secondary | ICD-10-CM | POA: Diagnosis not present

## 2018-08-16 DIAGNOSIS — D649 Anemia, unspecified: Secondary | ICD-10-CM | POA: Diagnosis not present

## 2018-08-16 DIAGNOSIS — J449 Chronic obstructive pulmonary disease, unspecified: Secondary | ICD-10-CM | POA: Diagnosis not present

## 2018-08-18 ENCOUNTER — Other Ambulatory Visit: Payer: Medicare HMO

## 2018-08-18 ENCOUNTER — Other Ambulatory Visit: Payer: Self-pay | Admitting: Family Medicine

## 2018-08-18 ENCOUNTER — Telehealth: Payer: Self-pay | Admitting: Gastroenterology

## 2018-08-18 NOTE — Telephone Encounter (Signed)
Copied from CRM [phone]. Topic: General - Other >> Aug 18, 2018 10:53 AM Marylen PontoMcneil, Ja-Kwan wrote: Reason for CRM: Pt stated she would like to ask Dr Dossie Arbourrissman if he still wants her to keep her appt on 08/23/18 or if he wants her to reschedule the appt until after her surgery on 08/30/18. Pt requests call back to advise. Cb# [phone]

## 2018-08-18 NOTE — Telephone Encounter (Signed)
Requested medication (s) are due for refill today: yes  Requested medication (s) are on the active medication list: yes  Last refill:  05/31/18  #20 with 3 refills  Future visit scheduled: yes  Notes to clinic:  Unable to refill per protocol    Requested Prescriptions  Pending Prescriptions Disp Refills   ondansetron (ZOFRAN) 4 MG tablet [Pharmacy Med Name: ONDANSETRON HCL 4 MG TABLET] 20 tablet 3    Sig: TAKE 1 TABLET BY MOUTH EVERY 8 HOURS AS NEEDED FOR NAUSEA AND VOMITING     Not Delegated - Gastroenterology: Antiemetics Failed - 08/18/2018  1:46 PM      Failed - This refill cannot be delegated      Passed - Valid encounter within last 6 months    Recent Outpatient Visits          1 month ago Depression, unspecified depression type   [facility]iss[facility], Redge GainerMark A, MD   3 months ago Diverticulitis of large intestine with perforation with bleeding   [facility] Steele Sizerriss[facility], Mark A, MD   8 months ago Hyperlipidemia, unspecified hyperlipidemia type   [facility] Crissman, Redge GainerMark A, MD   1 year ago Hypothyroidism, unspecified type   [facility] Crissman, Redge GainerMark A, MD   1 year ago Trapezius muscle spasm   [facility] Crissman, Redge GainerMark A, MD      Future Appointments            In 5 days Crissman, Redge GainerMark A, [facility]   In 5 days Wyline MoodAnna, Kiran, MD [facility]   In 1 month Crissman, Redge GainerMark A, [facility]

## 2018-08-18 NOTE — Telephone Encounter (Signed)
Pt left vm to see if pt needs to Keep the apt for 08/23/18 or not, she is having a procedure with Dr. Ardeen FillersParbon

## 2018-08-18 NOTE — Telephone Encounter (Signed)
Please get patient rescheduled. 

## 2018-08-19 ENCOUNTER — Encounter
Admission: RE | Admit: 2018-08-19 | Discharge: 2018-08-19 | Disposition: A | Discharge status: Home / Self Care | Payer: Medicare HMO | Source: Ambulatory Visit | Attending: Surgery | Admitting: Surgery

## 2018-08-19 ENCOUNTER — Other Ambulatory Visit: Payer: Self-pay

## 2018-08-19 DIAGNOSIS — Z01818 Encounter for other preprocedural examination: Secondary | ICD-10-CM | POA: Insufficient documentation

## 2018-08-19 HISTORY — DX: Gastro-esophageal reflux disease without esophagitis: K21.9

## 2018-08-19 HISTORY — DX: Personal history of sudden cardiac arrest: Z86.74

## 2018-08-19 HISTORY — DX: Headache: R51

## 2018-08-19 HISTORY — DX: Headache, unspecified: R51.9

## 2018-08-19 HISTORY — DX: Personal history of other medical treatment: Z92.89

## 2018-08-19 NOTE — Patient Instructions (Signed)
Your procedure is scheduled on: Tuesday, August 30, 2018  Report to THE SECOND FLOOR OF THE MEDICAL MALL         DO NOT STOP ON THE FIRST FLOOR TO REGISTER  To find out your arrival time please call [phone] between 1PM - 3PM on Monday, December 2,2019  Remember: Instructions that are not followed completely may result in serious medical risk,  up to and including death, or upon the discretion of your surgeon and anesthesiologist your  surgery may need to be rescheduled.     _X__ 1. Do not eat food after midnight the night before your procedure.                 No gum chewing or hard candies.                     ABSOLUTELY NOTHING SOLID IN YOUR MOUTH AFTER MIDNIGHT                  You may drink clear liquids up to 2 hours before you are scheduled to arrive for your surgery-                   DO not drink clear liquids within 2 hours of the start of your surgery.                  Clear Liquids include:  water, apple juice without pulp, clear carbohydrate                 drink such as Clearfast of Gatorade, Black Coffee or Tea (Do not add                 anything to coffee or tea). NO BROTH OR JELLO AFTER MIDNIGHT  __X__2.  On the morning of surgery brush your teeth with toothpaste and water,                   You may rinse your mouth with mouthwash if you wish.                         Do not swallow any toothpaste of mouthwash.     _X__ 3.  No Alcohol for 24 hours before or after surgery.   _X__ 4.  Do Not Smoke or use e-cigarettes For 24 Hours Prior to Your Surgery.                 Do not use any chewable tobacco products for at least 6 hours prior to                 surgery.  ____  5.  Bring all medications with you on the day of surgery if instructed.   ____  6.  Notify your doctor if there is any change in your medical condition      (cold, fever, infections).     Do not wear jewelry, make-up, hairpins, clips or nail polish. Do not wear  lotions, powders, or perfumes. You may wear deodorant. Do not shave 48 hours prior to surgery. Men may shave face and neck. Do not bring valuables to the hospital.    [facility] is not responsible for any belongings or valuables.  Contacts, dentures or bridgework may not be worn into surgery. Leave your suitcase in the car. After surgery it may be brought to your room. For patients admitted to the hospital,  discharge time is determined by your treatment team.   Patients discharged the day of surgery will not be allowed to drive home.   Please read over the following fact sheets that you were given:   PREPARING FOR SURGERY                 ADVANCE DIRECTIVES    __X__ Take these medicines the morning of surgery with A SIP OF WATER:    1. WELLBUTRIN  2. VALIUM  3. ALLEGRA  4. ZOFRAN, IF NEED IT  5. PROTONIX (MAKE SURE TO TAKE THE NIGHT BEFORE ALSO)  6. SULCRAFATE             7. LEVOTHYROXINE             8. CRESTOR  __X__ Fleet Enema (as directed) PER DR. PABON  __X__ Use CHG SAGE WIPES AS DIRECTED  _X___ Stop ALL ASPIRIN PRODUCTS AS OF 08/23/2018  _X___ Stop Anti-inflammatories AS OF 08/23/18                 THIS INCLUDED MELOXICAM   _X___ Stop supplements until after surgery.                  STOP ON 08/23/18  ____ Bring C-Pap to the hospital.   WEAR LOOSE AND COMFORTABLE CLOTHING TO THE HOSPITAL

## 2018-08-19 NOTE — Telephone Encounter (Signed)
Pt left vm  She wants to know if she needs to keep the 08/23/18 apt she is having take down surgery 08/30/2018  Please call pt so she can make arrangments

## 2018-08-19 NOTE — Pre-Procedure Instructions (Signed)
Reviewed medical history of patient with Dr. Sampson GoonFitzgerald (anesthesia) as per Dr. Herma MeringPabons' request. Patient has improved in all areas of her health by gaining weight, taking vitamins to improve her malnourishment, she has been smoke free x 1 month, very active at home. Pt states she walks up her stairs about 20 times a day without difficulty. Denies respiratory concerns. Lab work from 06/2018 is acceptable and reviewed EKG from 03/2018 with Dr. Carmon GinsbergF. According to information provided to anesthesia, patient appears acceptable to proceed with surgery as planned.

## 2018-08-23 ENCOUNTER — Other Ambulatory Visit: Payer: Self-pay

## 2018-08-23 ENCOUNTER — Encounter: Payer: Medicare Other | Admitting: Family Medicine

## 2018-08-23 ENCOUNTER — Ambulatory Visit: Payer: Medicare HMO | Admitting: Gastroenterology

## 2018-08-23 DIAGNOSIS — M810 Age-related osteoporosis without current pathological fracture: Secondary | ICD-10-CM | POA: Diagnosis not present

## 2018-08-23 DIAGNOSIS — D649 Anemia, unspecified: Secondary | ICD-10-CM | POA: Diagnosis not present

## 2018-08-23 DIAGNOSIS — N390 Urinary tract infection, site not specified: Secondary | ICD-10-CM | POA: Diagnosis not present

## 2018-08-23 DIAGNOSIS — K260 Acute duodenal ulcer with hemorrhage: Secondary | ICD-10-CM | POA: Diagnosis not present

## 2018-08-23 DIAGNOSIS — K219 Gastro-esophageal reflux disease without esophagitis: Secondary | ICD-10-CM | POA: Diagnosis not present

## 2018-08-23 DIAGNOSIS — J449 Chronic obstructive pulmonary disease, unspecified: Secondary | ICD-10-CM | POA: Diagnosis not present

## 2018-08-23 DIAGNOSIS — Z433 Encounter for attention to colostomy: Secondary | ICD-10-CM | POA: Diagnosis not present

## 2018-08-23 DIAGNOSIS — Z48815 Encounter for surgical aftercare following surgery on the digestive system: Secondary | ICD-10-CM | POA: Diagnosis not present

## 2018-08-23 DIAGNOSIS — E039 Hypothyroidism, unspecified: Secondary | ICD-10-CM | POA: Diagnosis not present

## 2018-08-23 DIAGNOSIS — K5720 Diverticulitis of large intestine with perforation and abscess without bleeding: Secondary | ICD-10-CM | POA: Diagnosis not present

## 2018-08-24 MED ORDER — ROSUVASTATIN CALCIUM 10 MG PO TABS: 10.0000 mg | ORAL_TABLET | Freq: Every day | ORAL | 2 refills | Status: DC

## 2018-08-24 MED ORDER — LEVOTHYROXINE SODIUM 25 MCG PO TABS: 25.0000 ug | ORAL_TABLET | Freq: Every day | ORAL | 2 refills | Status: DC

## 2018-08-29 ENCOUNTER — Telehealth: Payer: Self-pay | Admitting: Gastroenterology

## 2018-08-29 MED ORDER — SODIUM CHLORIDE 0.9 % IV SOLN
1.0000 g | INTRAVENOUS | Status: AC
  Administered 2018-08-30: 1 g via INTRAVENOUS
  Filled 2018-08-29: qty 1

## 2018-08-29 NOTE — Telephone Encounter (Signed)
Pt says her prescription req for suprofate 1g twice a day is not filled. Pt says she needs to take all her medications prior to surgery on Dec. 3

## 2018-08-30 ENCOUNTER — Encounter
Admission: RE | Disposition: A | Discharge status: Home Health | Payer: Self-pay | Source: Home / Self Care | Attending: Surgery

## 2018-08-30 ENCOUNTER — Inpatient Hospital Stay: Payer: Medicare HMO | Admitting: Certified Registered Nurse Anesthetist

## 2018-08-30 ENCOUNTER — Other Ambulatory Visit: Payer: Self-pay

## 2018-08-30 ENCOUNTER — Inpatient Hospital Stay
Admission: RE | Admit: 2018-08-30 | Discharge: 2018-09-06 | DRG: 330 | Disposition: A | Discharge status: Home Health | Payer: Medicare HMO | Attending: Surgery | Admitting: Surgery

## 2018-08-30 DIAGNOSIS — M810 Age-related osteoporosis without current pathological fracture: Secondary | ICD-10-CM | POA: Diagnosis present

## 2018-08-30 DIAGNOSIS — M1990 Unspecified osteoarthritis, unspecified site: Secondary | ICD-10-CM | POA: Diagnosis present

## 2018-08-30 DIAGNOSIS — R140 Abdominal distension (gaseous): Secondary | ICD-10-CM

## 2018-08-30 DIAGNOSIS — K435 Parastomal hernia without obstruction or  gangrene: Secondary | ICD-10-CM | POA: Diagnosis present

## 2018-08-30 DIAGNOSIS — K219 Gastro-esophageal reflux disease without esophagitis: Secondary | ICD-10-CM | POA: Diagnosis not present

## 2018-08-30 DIAGNOSIS — Z433 Encounter for attention to colostomy: Principal | ICD-10-CM

## 2018-08-30 DIAGNOSIS — R109 Unspecified abdominal pain: Secondary | ICD-10-CM

## 2018-08-30 DIAGNOSIS — E785 Hyperlipidemia, unspecified: Secondary | ICD-10-CM | POA: Diagnosis present

## 2018-08-30 DIAGNOSIS — R69 Illness, unspecified: Secondary | ICD-10-CM | POA: Diagnosis not present

## 2018-08-30 DIAGNOSIS — Z885 Allergy status to narcotic agent status: Secondary | ICD-10-CM | POA: Diagnosis not present

## 2018-08-30 DIAGNOSIS — K439 Ventral hernia without obstruction or gangrene: Secondary | ICD-10-CM | POA: Diagnosis present

## 2018-08-30 DIAGNOSIS — Z87891 Personal history of nicotine dependence: Secondary | ICD-10-CM

## 2018-08-30 DIAGNOSIS — Z9889 Other specified postprocedural states: Secondary | ICD-10-CM | POA: Diagnosis not present

## 2018-08-30 DIAGNOSIS — R0902 Hypoxemia: Secondary | ICD-10-CM | POA: Diagnosis not present

## 2018-08-30 DIAGNOSIS — E039 Hypothyroidism, unspecified: Secondary | ICD-10-CM | POA: Diagnosis present

## 2018-08-30 DIAGNOSIS — Z90710 Acquired absence of both cervix and uterus: Secondary | ICD-10-CM

## 2018-08-30 DIAGNOSIS — E440 Moderate protein-calorie malnutrition: Secondary | ICD-10-CM

## 2018-08-30 DIAGNOSIS — Z888 Allergy status to other drugs, medicaments and biological substances status: Secondary | ICD-10-CM

## 2018-08-30 DIAGNOSIS — K660 Peritoneal adhesions (postprocedural) (postinfection): Secondary | ICD-10-CM | POA: Diagnosis not present

## 2018-08-30 DIAGNOSIS — E876 Hypokalemia: Secondary | ICD-10-CM | POA: Diagnosis not present

## 2018-08-30 DIAGNOSIS — Z9109 Other allergy status, other than to drugs and biological substances: Secondary | ICD-10-CM

## 2018-08-30 DIAGNOSIS — K5732 Diverticulitis of large intestine without perforation or abscess without bleeding: Secondary | ICD-10-CM | POA: Diagnosis present

## 2018-08-30 DIAGNOSIS — R000 Tachycardia, unspecified: Secondary | ICD-10-CM | POA: Diagnosis not present

## 2018-08-30 DIAGNOSIS — R339 Retention of urine, unspecified: Secondary | ICD-10-CM | POA: Diagnosis not present

## 2018-08-30 HISTORY — PX: COLOSTOMY TAKEDOWN: SHX5783

## 2018-08-30 HISTORY — PX: VENTRAL HERNIA REPAIR: SHX424

## 2018-08-30 LAB — CBC
HCT: 34.5 % — ABNORMAL LOW (ref 36.0–46.0)
Hemoglobin: 11.1 g/dL — ABNORMAL LOW (ref 12.0–15.0)
MCH: 34.5 pg — ABNORMAL HIGH (ref 26.0–34.0)
MCHC: 32.2 g/dL (ref 30.0–36.0)
MCV: 107.1 fL — ABNORMAL HIGH (ref 80.0–100.0)
NRBC: 0 % (ref 0.0–0.2)
Platelets: 337 10*3/uL (ref 150–400)
RBC: 3.22 MIL/uL — ABNORMAL LOW (ref 3.87–5.11)
RDW: 11.3 % — ABNORMAL LOW (ref 11.5–15.5)
WBC: 12.2 10*3/uL — ABNORMAL HIGH (ref 4.0–10.5)

## 2018-08-30 LAB — CREATININE, SERUM
CREATININE: 1.07 mg/dL — AB (ref 0.44–1.00)
GFR calc Af Amer: >60 mL/min (ref >60)
GFR calc non Af Amer: 54 mL/min — ABNORMAL LOW (ref >60)

## 2018-08-30 SURGERY — CLOSURE, COLOSTOMY
Anesthesia: General

## 2018-08-30 MED ORDER — FENTANYL CITRATE (PF) 250 MCG/5ML IJ SOLN
INTRAMUSCULAR | Status: AC
  Filled 2018-08-30: qty 5

## 2018-08-30 MED ORDER — ACETAMINOPHEN 500 MG PO TABS
1000.0000 mg | ORAL_TABLET | Freq: Four times a day (QID) | ORAL | Status: DC
  Administered 2018-08-30 – 2018-09-06 (×23): 1000 mg via ORAL
  Filled 2018-08-30 (×26): qty 2

## 2018-08-30 MED ORDER — HYDROMORPHONE HCL 1 MG/ML IJ SOLN
INTRAMUSCULAR | Status: AC
  Filled 2018-08-30: qty 1

## 2018-08-30 MED ORDER — GABAPENTIN 300 MG PO CAPS
300.0000 mg | ORAL_CAPSULE | Freq: Once | ORAL | Status: DC
  Filled 2018-08-30: qty 1

## 2018-08-30 MED ORDER — LACTATED RINGERS IV SOLN
INTRAVENOUS | Status: DC
  Administered 2018-08-30 – 2018-09-03 (×7): via INTRAVENOUS

## 2018-08-30 MED ORDER — ROCURONIUM BROMIDE 100 MG/10ML IV SOLN
INTRAVENOUS | Status: DC | PRN
  Administered 2018-08-30: 40 mg via INTRAVENOUS
  Administered 2018-08-30: 10 mg via INTRAVENOUS
  Administered 2018-08-30: 5 mg via INTRAVENOUS
  Administered 2018-08-30 (×5): 10 mg via INTRAVENOUS

## 2018-08-30 MED ORDER — PROCHLORPERAZINE MALEATE 10 MG PO TABS
10.0000 mg | ORAL_TABLET | Freq: Four times a day (QID) | ORAL | Status: DC | PRN
  Filled 2018-08-30: qty 1

## 2018-08-30 MED ORDER — ENOXAPARIN SODIUM 40 MG/0.4ML ~~LOC~~ SOLN
40.0000 mg | SUBCUTANEOUS | Status: DC
  Administered 2018-08-31 – 2018-09-06 (×7): 40 mg via SUBCUTANEOUS
  Filled 2018-08-30 (×7): qty 0.4

## 2018-08-30 MED ORDER — FENTANYL CITRATE (PF) 100 MCG/2ML IJ SOLN
INTRAMUSCULAR | Status: DC | PRN
  Administered 2018-08-30: 50 ug via INTRAVENOUS
  Administered 2018-08-30: 25 ug via INTRAVENOUS
  Administered 2018-08-30: 100 ug via INTRAVENOUS

## 2018-08-30 MED ORDER — PROPOFOL 10 MG/ML IV BOLUS
INTRAVENOUS | Status: AC
  Filled 2018-08-30: qty 20

## 2018-08-30 MED ORDER — PANTOPRAZOLE SODIUM 40 MG IV SOLR
40.0000 mg | Freq: Every day | INTRAVENOUS | Status: DC
  Administered 2018-08-30 – 2018-09-05 (×7): 40 mg via INTRAVENOUS
  Filled 2018-08-30 (×7): qty 40

## 2018-08-30 MED ORDER — DIPHENHYDRAMINE HCL 50 MG/ML IJ SOLN: 12.5000 mg | Freq: Four times a day (QID) | INTRAMUSCULAR | Status: DC | PRN

## 2018-08-30 MED ORDER — PHENYLEPHRINE HCL 10 MG/ML IJ SOLN
INTRAMUSCULAR | Status: DC | PRN
  Administered 2018-08-30 (×4): 100 ug via INTRAVENOUS
  Administered 2018-08-30: 200 ug via INTRAVENOUS
  Administered 2018-08-30 (×6): 100 ug via INTRAVENOUS

## 2018-08-30 MED ORDER — BUPIVACAINE-EPINEPHRINE 0.25% -1:200000 IJ SOLN
INTRAMUSCULAR | Status: DC | PRN
  Administered 2018-08-30: 30 mL

## 2018-08-30 MED ORDER — CHLORHEXIDINE GLUCONATE CLOTH 2 % EX PADS: 6.0000 | MEDICATED_PAD | Freq: Once | CUTANEOUS | Status: DC

## 2018-08-30 MED ORDER — ROCURONIUM BROMIDE 50 MG/5ML IV SOLN
INTRAVENOUS | Status: AC
  Filled 2018-08-30: qty 1

## 2018-08-30 MED ORDER — HYDROMORPHONE HCL 1 MG/ML IJ SOLN
1.0000 mg | INTRAMUSCULAR | Status: DC | PRN
  Administered 2018-08-31: 1 mg via INTRAVENOUS
  Filled 2018-08-30: qty 1

## 2018-08-30 MED ORDER — PHENYLEPHRINE HCL 10 MG/ML IJ SOLN
INTRAMUSCULAR | Status: AC
  Filled 2018-08-30: qty 1

## 2018-08-30 MED ORDER — KETAMINE HCL 50 MG/ML IJ SOLN
INTRAMUSCULAR | Status: DC | PRN
  Administered 2018-08-30: 20 mg via INTRAVENOUS
  Administered 2018-08-30: 10 mg via INTRAMUSCULAR

## 2018-08-30 MED ORDER — SODIUM CHLORIDE 0.9 % IV SOLN
INTRAVENOUS | Status: DC | PRN
  Administered 2018-08-30: 30 ug/min via INTRAVENOUS

## 2018-08-30 MED ORDER — LIDOCAINE HCL (PF) 2 % IJ SOLN
INTRAMUSCULAR | Status: AC
  Filled 2018-08-30: qty 10

## 2018-08-30 MED ORDER — MIDAZOLAM HCL 2 MG/2ML IJ SOLN
INTRAMUSCULAR | Status: AC
  Filled 2018-08-30: qty 2

## 2018-08-30 MED ORDER — SUCCINYLCHOLINE CHLORIDE 20 MG/ML IJ SOLN
INTRAMUSCULAR | Status: AC
  Filled 2018-08-30: qty 1

## 2018-08-30 MED ORDER — KETAMINE HCL 50 MG/ML IJ SOLN
INTRAMUSCULAR | Status: AC
  Filled 2018-08-30: qty 10

## 2018-08-30 MED ORDER — LACTATED RINGERS IV SOLN
INTRAVENOUS | Status: DC
  Administered 2018-08-30 (×3): via INTRAVENOUS

## 2018-08-30 MED ORDER — SODIUM CHLORIDE 0.9 % IV SOLN
INTRAVENOUS | Status: DC | PRN
  Administered 2018-08-30: 70 mL

## 2018-08-30 MED ORDER — GABAPENTIN 600 MG PO TABS
300.0000 mg | ORAL_TABLET | Freq: Three times a day (TID) | ORAL | Status: DC
  Administered 2018-08-30 – 2018-09-06 (×21): 300 mg via ORAL
  Filled 2018-08-30 (×21): qty 1

## 2018-08-30 MED ORDER — CELECOXIB 200 MG PO CAPS
200.0000 mg | ORAL_CAPSULE | ORAL | Status: AC
  Administered 2018-08-30: 200 mg via ORAL

## 2018-08-30 MED ORDER — ACETAMINOPHEN 500 MG PO TABS
1000.0000 mg | ORAL_TABLET | ORAL | Status: AC
  Administered 2018-08-30: 1000 mg via ORAL

## 2018-08-30 MED ORDER — GABAPENTIN 300 MG PO CAPS
ORAL_CAPSULE | ORAL | Status: AC
  Filled 2018-08-30: qty 1

## 2018-08-30 MED ORDER — ONDANSETRON HCL 4 MG/2ML IJ SOLN
4.0000 mg | Freq: Four times a day (QID) | INTRAMUSCULAR | Status: DC | PRN
  Administered 2018-09-03: 4 mg via INTRAVENOUS
  Filled 2018-08-30: qty 2

## 2018-08-30 MED ORDER — PROPOFOL 10 MG/ML IV BOLUS
INTRAVENOUS | Status: DC | PRN
  Administered 2018-08-30: 120 mg via INTRAVENOUS

## 2018-08-30 MED ORDER — DIPHENHYDRAMINE HCL 12.5 MG/5ML PO ELIX
12.5000 mg | ORAL_SOLUTION | Freq: Four times a day (QID) | ORAL | Status: DC | PRN
  Administered 2018-09-05: 12.5 mg via ORAL
  Filled 2018-08-30 (×2): qty 5

## 2018-08-30 MED ORDER — CYCLOBENZAPRINE HCL 10 MG PO TABS
5.0000 mg | ORAL_TABLET | Freq: Three times a day (TID) | ORAL | Status: DC
  Administered 2018-08-30 – 2018-09-06 (×21): 5 mg via ORAL
  Filled 2018-08-30 (×12): qty 1
  Filled 2018-08-30: qty 0.5
  Filled 2018-08-30 (×9): qty 1

## 2018-08-30 MED ORDER — OXYCODONE HCL 5 MG PO TABS
5.0000 mg | ORAL_TABLET | ORAL | Status: DC | PRN
  Administered 2018-08-30 – 2018-09-06 (×22): 10 mg via ORAL
  Filled 2018-08-30 (×22): qty 2

## 2018-08-30 MED ORDER — DIAZEPAM 5 MG PO TABS
5.0000 mg | ORAL_TABLET | Freq: Two times a day (BID) | ORAL | Status: DC
  Administered 2018-08-30 – 2018-09-06 (×14): 5 mg via ORAL
  Filled 2018-08-30 (×14): qty 1

## 2018-08-30 MED ORDER — HYDRALAZINE HCL 20 MG/ML IJ SOLN: 10.0000 mg | INTRAMUSCULAR | Status: DC | PRN

## 2018-08-30 MED ORDER — GABAPENTIN 300 MG PO CAPS: 300.0000 mg | ORAL_CAPSULE | ORAL | Status: DC

## 2018-08-30 MED ORDER — MIDAZOLAM HCL 2 MG/2ML IJ SOLN
INTRAMUSCULAR | Status: DC | PRN
  Administered 2018-08-30: 2 mg via INTRAVENOUS

## 2018-08-30 MED ORDER — LIDOCAINE HCL (CARDIAC) PF 100 MG/5ML IV SOSY
PREFILLED_SYRINGE | INTRAVENOUS | Status: DC | PRN
  Administered 2018-08-30: 80 mg via INTRAVENOUS

## 2018-08-30 MED ORDER — SUGAMMADEX SODIUM 200 MG/2ML IV SOLN
INTRAVENOUS | Status: DC | PRN
  Administered 2018-08-30: 200 mg via INTRAVENOUS

## 2018-08-30 MED ORDER — ONDANSETRON 4 MG PO TBDP
4.0000 mg | ORAL_TABLET | Freq: Four times a day (QID) | ORAL | Status: DC | PRN
  Administered 2018-09-06: 4 mg via ORAL
  Filled 2018-08-30: qty 1

## 2018-08-30 MED ORDER — HYDROMORPHONE HCL 1 MG/ML IJ SOLN
INTRAMUSCULAR | Status: DC | PRN
  Administered 2018-08-30 (×2): 0.5 mg via INTRAVENOUS

## 2018-08-30 MED ORDER — SUGAMMADEX SODIUM 200 MG/2ML IV SOLN
INTRAVENOUS | Status: AC
  Filled 2018-08-30: qty 2

## 2018-08-30 MED ORDER — PROCHLORPERAZINE EDISYLATE 10 MG/2ML IJ SOLN
5.0000 mg | Freq: Four times a day (QID) | INTRAMUSCULAR | Status: DC | PRN
  Filled 2018-08-30: qty 2

## 2018-08-30 MED ORDER — CELECOXIB 200 MG PO CAPS
ORAL_CAPSULE | ORAL | Status: AC
  Administered 2018-08-30: 200 mg via ORAL
  Filled 2018-08-30: qty 1

## 2018-08-30 MED ORDER — CELECOXIB 200 MG PO CAPS
ORAL_CAPSULE | ORAL | Status: AC
  Filled 2018-08-30: qty 1

## 2018-08-30 MED ORDER — HYDROMORPHONE HCL 1 MG/ML IJ SOLN: 0.2500 mg | INTRAMUSCULAR | Status: DC | PRN

## 2018-08-30 MED ORDER — ACETAMINOPHEN 500 MG PO TABS
ORAL_TABLET | ORAL | Status: AC
  Administered 2018-08-30: 1000 mg via ORAL
  Filled 2018-08-30: qty 2

## 2018-08-30 MED ORDER — ALBUMIN HUMAN 5 % IV SOLN
INTRAVENOUS | Status: DC | PRN
  Administered 2018-08-30: 12:00:00 via INTRAVENOUS

## 2018-08-30 SURGICAL SUPPLY — 71 items
APPLIER CLIP 11 MED OPEN (CLIP) ×2
APPLIER CLIP 13 LRG OPEN (CLIP) ×2
APR CLP LRG 13 20 CLIP (CLIP) ×1
APR CLP MED 11 20 MLT OPN (CLIP) ×1
BASIN GRAD PLASTIC 32OZ STRL (MISCELLANEOUS) ×1 IMPLANT
BLADE CLIPPER SURG (BLADE) ×2 IMPLANT
BNDG CONFORM 2 STRL LF (GAUZE/BANDAGES/DRESSINGS) ×4 IMPLANT
BRUSH SCRUB EZ  4% CHG (MISCELLANEOUS) ×1
BRUSH SCRUB EZ 4% CHG (MISCELLANEOUS) ×1 IMPLANT
BULB RESERV EVAC DRAIN JP 100C (MISCELLANEOUS) ×2 IMPLANT
CANISTER SUCT 3000ML PPV (MISCELLANEOUS) ×2 IMPLANT
CATH URET ROBINSON 16FR STRL (CATHETERS) ×2 IMPLANT
CHLORAPREP W/TINT 26ML (MISCELLANEOUS) ×2 IMPLANT
CLIP APPLIE 11 MED OPEN (CLIP) ×1 IMPLANT
CLIP APPLIE 13 LRG OPEN (CLIP) ×1 IMPLANT
COVER WAND RF STERILE (DRAPES) ×2 IMPLANT
DRAIN CHANNEL JP 15F RND 16 (MISCELLANEOUS) ×2 IMPLANT
DRAPE LAPAROTOMY 100X77 ABD (DRAPES) ×2 IMPLANT
DRAPE SHEET LG 3/4 BI-LAMINATE (DRAPES) ×1 IMPLANT
DRSG OPSITE POSTOP 4X14 (GAUZE/BANDAGES/DRESSINGS) ×1 IMPLANT
DRSG OPSITE POSTOP 4X6 (GAUZE/BANDAGES/DRESSINGS) ×2 IMPLANT
DRSG TELFA 3X8 NADH (GAUZE/BANDAGES/DRESSINGS) ×2 IMPLANT
DRSG TELFA 4X3 1S NADH ST (GAUZE/BANDAGES/DRESSINGS) ×2 IMPLANT
ELECT BLADE 6.5 EXT (BLADE) ×2 IMPLANT
ELECT CAUTERY BLADE 6.4 (BLADE) ×2 IMPLANT
ELECT REM PT RETURN 9FT ADLT (ELECTROSURGICAL) ×2
ELECTRODE REM PT RTRN 9FT ADLT (ELECTROSURGICAL) ×1 IMPLANT
GAUZE SPONGE 4X4 12PLY STRL (GAUZE/BANDAGES/DRESSINGS) ×2 IMPLANT
GLOVE BIO SURGEON STRL SZ7 (GLOVE) ×4 IMPLANT
GOWN STRL REUS W/ TWL LRG LVL3 (GOWN DISPOSABLE) ×4 IMPLANT
GOWN STRL REUS W/TWL LRG LVL3 (GOWN DISPOSABLE) ×8
HANDLE SUCTION POOLE (INSTRUMENTS) ×1 IMPLANT
HEMOSTAT SURGICEL 2X14 (HEMOSTASIS) ×1 IMPLANT
LIGASURE IMPACT 36 18CM CVD LR (INSTRUMENTS) ×2 IMPLANT
MESH PHASIX ST 25X30 (Mesh General) ×1 IMPLANT
NDL HYPO 25X1 1.5 SAFETY (NEEDLE) ×1 IMPLANT
NEEDLE HYPO 22GX1.5 SAFETY (NEEDLE) ×2 IMPLANT
NEEDLE HYPO 25X1 1.5 SAFETY (NEEDLE) ×2 IMPLANT
NS IRRIG 1000ML POUR BTL (IV SOLUTION) ×2 IMPLANT
PACK BASIN MAJOR ARMC (MISCELLANEOUS) ×2 IMPLANT
PACK BASIN MINOR ARMC (MISCELLANEOUS) ×2 IMPLANT
PACK COLON CLEAN CLOSURE (MISCELLANEOUS) ×2 IMPLANT
PAD DRESSING TELFA 3X8 NADH (GAUZE/BANDAGES/DRESSINGS) ×1 IMPLANT
PLEDGET CV PTFE 7X3 (MISCELLANEOUS) IMPLANT
RELOAD PROXIMATE 75MM BLUE (ENDOMECHANICALS) IMPLANT
RELOAD STAPLE 75 3.8 BLU REG (ENDOMECHANICALS) IMPLANT
SPONGE KITTNER 5P (MISCELLANEOUS) ×1 IMPLANT
SPONGE LAP 18X18 RF (DISPOSABLE) ×4 IMPLANT
STAPLER PROXIMATE 75MM BLUE (STAPLE) IMPLANT
STAPLER SKIN PROX 35W (STAPLE) ×2 IMPLANT
STAPLER TA28 THK CONTR EEA XL (STAPLE) ×1 IMPLANT
SUCTION POOLE HANDLE (INSTRUMENTS) ×2
SUT ETHIBOND 0 MO6 C/R (SUTURE) ×2 IMPLANT
SUT ETHIBOND NAB MO 7 #0 18IN (SUTURE) ×2 IMPLANT
SUT ETHILON 3-0 FS-10 30 BLK (SUTURE) ×4
SUT PDS AB 0 CT1 27 (SUTURE) ×3 IMPLANT
SUT PDS PLUS 0 (SUTURE) ×4
SUT PDS PLUS AB 0 CT-2 (SUTURE) ×4 IMPLANT
SUT SILK 2 0 (SUTURE) ×2
SUT SILK 2 0 SH CR/8 (SUTURE) ×2 IMPLANT
SUT SILK 2 0SH CR/8 30 (SUTURE) ×1 IMPLANT
SUT SILK 2-0 18XBRD TIE 12 (SUTURE) IMPLANT
SUT VIC AB 2-0 SH 27 (SUTURE) ×4
SUT VIC AB 2-0 SH 27XBRD (SUTURE) ×2 IMPLANT
SUTURE EHLN 3-0 FS-10 30 BLK (SUTURE) IMPLANT
SYR 20CC LL (SYRINGE) ×4 IMPLANT
SYRINGE IRR TOOMEY STRL 70CC (SYRINGE) ×2 IMPLANT
TAPE MICROFOAM 4IN (TAPE) ×2 IMPLANT
TOWEL OR 17X26 4PK STRL BLUE (TOWEL DISPOSABLE) ×1 IMPLANT
TRAY FOLEY MTR SLVR 16FR STAT (SET/KITS/TRAYS/PACK) ×2 IMPLANT
WATER STERILE IRR 1000ML POUR (IV SOLUTION) ×2 IMPLANT

## 2018-08-30 NOTE — Anesthesia Procedure Notes (Signed)
Procedure Name: Intubation Date/Time: 08/30/2018 9:30 AM Performed by: Allean Found, CRNA Pre-anesthesia Checklist: Patient identified, Patient being monitored, Timeout performed, Emergency Drugs available and Suction available Patient Re-evaluated:Patient Re-evaluated prior to induction Oxygen Delivery Method: Circle system utilized Preoxygenation: Pre-oxygenation with 100% oxygen Induction Type: IV induction Ventilation: Mask ventilation without difficulty Laryngoscope Size: Mac and 3 Grade View: Grade I Tube type: Oral Tube size: 7.0 mm Number of attempts: 1 Airway Equipment and Method: Stylet Placement Confirmation: ETT inserted through vocal cords under direct vision,  positive ETCO2 and breath sounds checked- equal and bilateral Secured at: 21 cm Tube secured with: Tape Dental Injury: Teeth and Oropharynx as per pre-operative assessment

## 2018-08-30 NOTE — Op Note (Signed)
PROCEDURES: 1. Laparotomy with extensive LOA taking about 60 min total operative time 2. Colostomy takedown EEA colocolostomy 3. Takedown of splenic flexure 4. Abdominal wall reconstruction with bilateral Myocutaneous flaps ( component separation). On the Left TAR release and on the right Rives-Stoppa with placement of Phasix ST mesh 25x30cm  Pre-operative Diagnosis: 1. S/p Hartmann's 2. Ventral hernia and parastomal hernia  Post-operative Diagnosis: same  Surgeon: Leafy Roiego F Taren Toops   Assistants: Dr. Thelma Bargeaks ( required due to the complexity of the case, for EEA anastomosis and for adequate explosure  Anesthesia: General endotracheal anesthesia  ASA Class: 3  Surgeon: Sterling Bigiego Aitana Burry , MD FACS  Anesthesia: Gen. with endotracheal tube  Findings: 1. No evidence of intraoperative anastomotic leak 2. Parastomal hernia and midline ventral hernia   Estimated Blood Loss: 100cc         Drains: 15 Fr blake x 2 . One pelvic drain Left and Right drain on top of the mesh.                Complications: none               Condition: stable  Procedure Details  The patient was seen again in the Holding Room. The benefits, complications, treatment options, and expected outcomes were discussed with the patient. The risks of bleeding, infection, recurrence of symptoms, failure to resolve symptoms,  bowel injury, any of which could require further surgery were reviewed with the patient.   The patient was taken to Operating Room, identified as Annette Miller and the procedure verified.  A Time Out was held and the above information confirmed.   Prior to the induction of general anesthesia, antibiotic prophylaxis was administered. VTE prophylaxis was in place. General endotracheal anesthesia was then administered and tolerated well. After the induction, the abdomen was prepped with Chloraprep and draped in the sterile fashion. The patient was positioned in lithotomy position.  Midline laparotomy incision  was created and we entered the abdominal cavity under direct visualization starting on the cranial aspect of the incision.  The fascia was elevated and incised.  We extended the fascial incision and we encountered dense adhesions from the small bowel to the abdominal wall.  This was very tedious and we spent at least 60 minutes of total operative time lysing adhesions.  Please note that I actually had to remove part of peritoneum and the muscle to avoid any injury to the small bowel.  Finally able to safely perform a lysis of adhesions and obtain adequate exposure.  Annette Miller to the end colostomy and an incision was created incorporating the stoma.  Electrocautery was used to dissect through the cutaneous tissue and the fascia was identified.  Circumferential dissection was performed and were encounter also a small parastomal hernia.  The hernia sac was excised and were able to push down the end of the stoma through the defect.  Patient was then turned to the pelvis getting we encountered some adhesions.  Were able to taken down.  We visualized the Prolene sutures and dissected the rectal stump circumferentially.  We did find an area of the presacral fascia that was incorporated into our dissection.  We also identified the ureters at all times and preserve them.  There was no evidence of bladder injury either.    We performed a colotomy at the inside of the colon using electrocautery.  We were able to measure the diameter of the bowel and a 25 mm was the perfect fit.  Inserted the  anvil in the standard fashion and a staple of the ostomy end.  Number was brought through the staple line.  Dr. Thelma Barge was able to pass a 25 mm standard EEA stapler device through the anus through the end of the rectal stump. Under direct visualization we perform an end to end anastomosis with the EEA device. A leak test was performed inflating the colon with a Toomey syringe and a rubber catheter. No evidence of leak was observed. There  was also adequate hemostasis. A 15 Blake drain was placed in the pelvis.   We changed gloves and place a new tray.  The size of the parastomal hernia and in addition a ventral hernia we decided that the best approach was to do a component separation technique to be able to have adequate mesh coverage.  Due to the contamination of the case multiple comorbidities of the patient I decided to do an absorbable mesh.  We started our dissection incising the peritoneum and identify the left rectus abdominal muscle.  Incision was created and a retrorectus plane was dissected.  Medial to the perforator of the rectus muscle we incised the fascia and perform a transversus abdominal release.  Were able to get into the adequate plane and developed an extraperitoneal plane that it was a very generous.  We continue these from sternum to pubis in the standard fashion.  Attention then was turned to the contralateral side on the right side and we incised the posterior sheath of the rectus muscle identified the muscle and perform dissection on a retrorectal plane.  Given that the defects were only on the left side and I wanted to avoid bilateral component separation as I decided to do A Reeves-Stoppa approach on the right side.  We were Able to mobilize the rectus abdominal muscle and created a flap for adequate mesh placement.    Also able to close the end colostomy defect with a running 0 PDS.  The posterior rectus sheath was closed with multiple running 0 PDS suture. We then inserted the mesh in a retrorectus fashion on the right side and extraperitoneal on the left side.  We anchored the mesh to the pubis and to the sternal notch respectively.  There was very good coverage. Additional drain was placed on top of the mesh and secured to abdominal wall with a 3-0 nylon. The anterior sheath was closed with multiple running 0 PDS in the standard fashion.  Please note that at the end of the procedure was very happy with the  more than adequate coverage of the mesh for this complex ventral hernia.  We irrigated the subcutaneous tissue and all the incisions were closed with staples  Liposomal Marcaine was used to provide adequate analgesia, after the component separation was performed but before the mesh was placed.  Needle and laparotomy count were correct and there were no immediate complications.  Sterling Big, MD, FACS

## 2018-08-30 NOTE — Anesthesia Preprocedure Evaluation (Addendum)
Anesthesia Evaluation  Patient identified by MRN, date of birth, ID band Patient awake    Reviewed: Allergy & Precautions, H&P , NPO status , Patient's Chart, lab work & pertinent test results  Airway Mallampati: II  TM Distance: <3 FB     Dental  (+) Edentulous Upper, Edentulous Lower   Pulmonary neg pulmonary ROS, former smoker,           Cardiovascular +CHF (grade 2 DD, moderately elevated PA pressures)    - Left ventricle: The cavity size was normal. Systolic function was   normal. The estimated ejection fraction was in the range of 50%   to 55%. Wall motion was normal; there were no regional wall   motion abnormalities. Features are consistent with a pseudonormal   left ventricular filling pattern, with concomitant abnormal   relaxation and increased filling pressure (grade 2 diastolic   dysfunction). - Mitral valve: There was mild regurgitation. - Left atrium: The atrium was normal in size. - Right ventricle: Systolic function was normal. - Pulmonary arteries: Systolic pressure was moderatrely elevated PA   peak pressure: 50 mm Hg (S).   Neuro/Psych  Headaches, PSYCHIATRIC DISORDERS Anxiety Depression    GI/Hepatic Neg liver ROS, PUD, GERD  ,  Endo/Other  Hypothyroidism   Renal/GU      Musculoskeletal  (+) Arthritis ,   Abdominal   Peds  Hematology  (+) Blood dyscrasia, anemia ,   Anesthesia Other Findings Past Medical History: No date: Allergy No date: Anemia     Comment:  improved since surgery in May 2019 No date: Anxiety No date: Arthritis No date: Depression 2019: GERD (gastroesophageal reflux disease)     Comment:  PUD, history of diverticulitis No date: Goiter 03/2018: H/O cardiac arrest     Comment:  s/p surgery and while in hospital. no cardiac history No date: Headache 01/2018: History of blood transfusion No date: Hyperlipidemia No date: Hypothyroidism No date: Osteoporosis  Past  Surgical History: 1979: ABDOMINAL HYSTERECTOMY 01/2018: APPENDECTOMY 01/14/2018: COLONOSCOPY WITH PROPOFOL; N/A     Comment:  Procedure: COLONOSCOPY WITH PROPOFOL;  Surgeon: Toney Reil, MD;  Location: [facility] ENDOSCOPY;  Service:               Gastroenterology;  Laterality: N/A; 06/24/2018: COLONOSCOPY WITH PROPOFOL; N/A     Comment:  Procedure: COLONOSCOPY WITH PROPOFOL;  Surgeon: [name], MD;  [facility] END[facility];  Service:               Gastroenterology;  Laterality: N/A; 04/06/2018: ESOPHAGOGASTRODUODENOSCOPY; N/A     Comment:  Procedure: ESOPHAGOGASTRODUODENOSCOPY (EGD);  Surgeon:               Toney Reil, MD;  Location: [facility] ENDOSCOPY;                Service: Gastroenterology;  Laterality: N/A; 01/14/2018: ESOPHAGOGASTRODUODENOSCOPY (EGD) WITH PROPOFOL; N/A     Comment:  Procedure: ESOPHAGOGASTRODUODENOSCOPY (EGD) WITH               PROPOFOL;  Surgeon: Toney Reil, MD;  Location:               [facility] ENDOSCOPY;  Service: Gastroenterology;  Laterality:               N/A; 06/24/2018: ESOPHAGOGASTRODUODENOSCOPY (EGD) WITH PROPOFOL; N/A  Comment:  Procedure: ESOPHAGOGASTRODUODENOSCOPY (EGD) WITH               PROPOFOL;  Surgeon: Wyline MoodAnna, Kiran, MD;  [facility];  Service: Gastroenterology;  Laterality: N/A; 02/25/2018: LAPAROTOMY; N/A     Comment:  Procedure: EXPLORATORY LAPAROTOMY, PARTMAN'S PROCEDURE,               APPENDECTOMY, COLOSTOMY;  Surgeon: Star AgeKaplin, Aviva W, DO;                Location: [facility] ORS;  Service: General;  Laterality: N/A; 1976: TONSILLECTOMY 04/07/2018: VISCERAL ANGIOGRAPHY; N/A     Comment:  Procedure: VISCERAL ANGIOGRAPHY;  Surgeon: Annice Needyew, Jason S,              MD;  Location: [facility] INVASIVE CV LAB;  Service:               Cardiovascular;  Laterality: N/A;     Reproductive/Obstetrics negative OB ROS                            Anesthesia Physical Anesthesia  Plan  ASA: III  Anesthesia Plan: General ETT   Post-op Pain Management:    Induction:   PONV Risk Score and Plan: Ondansetron, Dexamethasone and Treatment may vary due to age or medical condition  Airway Management Planned:   Additional Equipment:   Intra-op Plan:   Post-operative Plan:   Informed Consent: I have reviewed the patients History and Physical, chart, labs and discussed the procedure including the risks, benefits and alternatives for the proposed anesthesia with the patient or authorized representative who has indicated his/her understanding and acceptance.   Dental Advisory Given  Plan Discussed with: Anesthesiologist, CRNA and Surgeon  Anesthesia Plan Comments:        Anesthesia Quick Evaluation

## 2018-08-30 NOTE — Interval H&P Note (Signed)
History and Physical Interval Note:  08/30/2018 9:12 AM  Annette Miller  has presented today for surgery, with the diagnosis of COLOSTOMY  The various methods of treatment have been discussed with the patient and family. After consideration of risks, benefits and other options for treatment, the patient has consented to  Procedure(s): COLOSTOMY TAKEDOWN (N/A) HERNIA REPAIR VENTRAL ADULT (N/A) as a surgical intervention .  The patient's history has been reviewed, patient examined, no change in status, stable for surgery.  I have reviewed the patient's chart and labs.  Questions were answered to the patient's satisfaction.     Diego F Pabon

## 2018-08-30 NOTE — Anesthesia Post-op Follow-up Note (Signed)
Anesthesia QCDR form completed.        

## 2018-08-30 NOTE — Transfer of Care (Signed)
Immediate Anesthesia Transfer of Care Note  Patient: Annette Miller  Procedure(s) Performed: COLOSTOMY TAKEDOWN (N/A ) HERNIA REPAIR VENTRAL ADULT (N/A )  Patient Location: PACU  Anesthesia Type:General  Level of Consciousness: awake and alert   Airway & Oxygen Therapy: Patient Spontanous Breathing and Patient connected to face mask oxygen  Post-op Assessment: Report given to RN and Post -op Vital signs reviewed and stable  Post vital signs: Reviewed and stable  Last Vitals:  Vitals Value Taken Time  BP 113/66 08/30/2018  2:54 PM  Temp    Pulse 86 08/30/2018  2:58 PM  Resp 13 08/30/2018  2:58 PM  SpO2 100 % 08/30/2018  2:58 PM  Vitals shown include unvalidated device data.  Last Pain:  Vitals:   08/30/18 1454  TempSrc:   PainSc: 0-No pain         Complications: No apparent anesthesia complications

## 2018-08-31 ENCOUNTER — Encounter: Payer: Self-pay | Admitting: Family Medicine

## 2018-08-31 ENCOUNTER — Encounter: Payer: Self-pay | Admitting: Surgery

## 2018-08-31 LAB — CBC
HCT: 31.3 % — ABNORMAL LOW (ref 36.0–46.0)
Hemoglobin: 10.1 g/dL — ABNORMAL LOW (ref 12.0–15.0)
MCH: 34.5 pg — ABNORMAL HIGH (ref 26.0–34.0)
MCHC: 32.3 g/dL (ref 30.0–36.0)
MCV: 106.8 fL — ABNORMAL HIGH (ref 80.0–100.0)
Platelets: 262 10*3/uL (ref 150–400)
RBC: 2.93 MIL/uL — ABNORMAL LOW (ref 3.87–5.11)
RDW: 11.4 % — ABNORMAL LOW (ref 11.5–15.5)
WBC: 7.9 10*3/uL (ref 4.0–10.5)
nRBC: 0 % (ref 0.0–0.2)

## 2018-08-31 LAB — BASIC METABOLIC PANEL
Anion gap: 3 — ABNORMAL LOW (ref 5–15)
BUN: 14 mg/dL (ref 8–23)
CO2: 26 mmol/L (ref 22–32)
Calcium: 7.7 mg/dL — ABNORMAL LOW (ref 8.9–10.3)
Chloride: 107 mmol/L (ref 98–111)
Creatinine, Ser: 0.9 mg/dL (ref 0.44–1.00)
GFR calc Af Amer: >60 mL/min (ref >60)
GFR calc non Af Amer: >60 mL/min (ref >60)
Glucose, Bld: 108 mg/dL — ABNORMAL HIGH (ref 70–99)
Potassium: 3.8 mmol/L (ref 3.5–5.1)
Sodium: 136 mmol/L (ref 135–145)

## 2018-08-31 LAB — PHOSPHORUS: Phosphorus: 3.3 mg/dL (ref 2.5–4.6)

## 2018-08-31 LAB — MAGNESIUM: Magnesium: 1.3 mg/dL — ABNORMAL LOW (ref 1.7–2.4)

## 2018-08-31 MED ORDER — HYDROMORPHONE HCL 1 MG/ML IJ SOLN
0.5000 mg | INTRAMUSCULAR | Status: DC | PRN
  Administered 2018-09-02 – 2018-09-05 (×9): 0.5 mg via INTRAVENOUS
  Filled 2018-08-31 (×9): qty 0.5

## 2018-08-31 MED ORDER — MAGNESIUM SULFATE 4 GM/100ML IV SOLN
4.0000 g | Freq: Once | INTRAVENOUS | Status: AC
  Administered 2018-08-31: 4 g via INTRAVENOUS
  Filled 2018-08-31: qty 100

## 2018-08-31 MED ORDER — SODIUM CHLORIDE 0.9 % IV BOLUS
500.0000 mL | Freq: Once | INTRAVENOUS | Status: AC
  Administered 2018-08-31: 500 mL via INTRAVENOUS

## 2018-08-31 MED ORDER — ALBUMIN HUMAN 5 % IV SOLN
12.5000 g | Freq: Once | INTRAVENOUS | Status: AC
  Administered 2018-08-31: 12.5 g via INTRAVENOUS
  Filled 2018-08-31 (×2): qty 250

## 2018-08-31 NOTE — Evaluation (Signed)
Physical Therapy Evaluation Patient Details Name: Annette Miller MRN: [id number] DOB: Dec 06, 1950 Today's Date: 08/31/2018   History of Present Illness  Patient is a 67 year old female admitted s/p colostomy takedown (RLQ, LLQ).  PMH includes osteoporosis, HA, goiter, depression, anxiety, anemia, perforated diverticulitis s/p Hartman's.    Clinical Impression  Patient is a 67 year old female who lives in a two story home with her son.  She is independent without AD at baseline.  Pt in recliner and reporting 9/10 pain upon PT arrival.  She is A&O and very eager to participate with therapy and return to full mobility.  Pt open to education regarding body mechanics for pain management.  She did not perform bed mobility during this session and will need to practice log rolling technique.  Pt unsteady on feet and heavily reliant on RW and PT to steady herself.  Pt demonstrated good strength in UE's and LE's, though limited in some testing due to pain.  PT discussed use of AD during recovery process and pt was open to education.  Pt will benefit from skilled PT with focus on strength, tolerance to activity, balance and transition to appropriate AD for recovery.    Follow Up Recommendations Home health PT;Supervision for mobility/OOB    Equipment Recommendations  Cane(Patient may need a cane for stability during recovery process.)    Recommendations for Other Services       Precautions / Restrictions Precautions Precautions: Fall Restrictions Weight Bearing Restrictions: No      Mobility  Bed Mobility Overal bed mobility: (Pt in chair.)                Transfers Overall transfer level: Needs assistance Equipment used: Rolling walker (2 wheeled) Transfers: Stand Pivot Transfers;Sit to/from Stand Sit to Stand: Mod assist;+2 safety/equipment Stand pivot transfers: Mod assist;+2 safety/equipment       General transfer comment: Pt required assistance to stand due to pain and relied  heavily on RW with occasional posterior LOB with need for assistance from PT to correct.  Ambulation/Gait Ambulation/Gait assistance: (Deferred due to safety concerns and pain.)              Stairs            Wheelchair Mobility    Modified Rankin (Stroke Patients Only)       Balance Overall balance assessment: Needs assistance Sitting-balance support: Feet supported;Bilateral upper extremity supported Sitting balance-Leahy Scale: Fair Sitting balance - Comments: Forward flexed posture with tendency to lean to R side to avoid pain.   Standing balance support: Bilateral upper extremity supported Standing balance-Leahy Scale: Poor Standing balance comment: Relies heavily on RW and 2 posterior LOB's with need for help to correct.                             Pertinent Vitals/Pain Pain Assessment: 0-10 Pain Score: 9  Pain Location: incision site. Pt also reports a HA. Pain Intervention(s): Limited activity within patient's tolerance    Home Living Family/patient expects to be discharged to:: Private residence Living Arrangements: Children(Son-gone during day.  Neighbors are able to check on pt frequently and pt has home health nurse. ) Available Help at Discharge: Family;Available PRN/intermittently Type of Home: House Home Access: Stairs to enter Entrance Stairs-Rails: None Entrance Stairs-Number of Steps: 5 Home Layout: Two level Home Equipment: Emergency planning/management officerhower seat;Walker - 2 wheels;Walker - 4 wheels      Prior Function Level of  Independence: Independent               Hand Dominance        Extremity/Trunk Assessment   Upper Extremity Assessment Upper Extremity Assessment: Generalized weakness    Lower Extremity Assessment Lower Extremity Assessment: Overall WFL for tasks assessed(Grossly 4-/5 bilaterally but very limited by pain.)    Cervical / Trunk Assessment Cervical / Trunk Assessment: Kyphotic(Slighlty.  Pt maintained a flexed  posture due to pain.)  Communication   Communication: No difficulties  Cognition Arousal/Alertness: Awake/alert Behavior During Therapy: WFL for tasks assessed/performed Overall Cognitive Status: Within Functional Limits for tasks assessed                                 General Comments: Very pleasant and willing to work with PT.      General Comments      Exercises Other Exercises Other Exercises: Education regarding use of RW, transfer mechanics and body mechanics during supine to sit and STS to manage pain. 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 PT Goal Formulation: With patient Time For Goal Achievement: 09/14/18 Potential to Achieve Goals: Good    Frequency Min 2X/week   Barriers to discharge        Co-evaluation               AM-PAC PT "6 Clicks" Mobility  Outcome Measure Help needed turning from your back to your side while in a flat bed without using bedrails?: A Lot Help needed moving from lying on your back to sitting on the side of a flat bed without using bedrails?: A Lot Help needed moving to and from a bed to a chair (including a wheelchair)?: A Lot Help needed standing up from a chair using your arms (e.g., wheelchair or bedside chair)?: A Lot Help needed to walk in hospital room?: A Lot Help needed climbing 3-5 steps with a railing? : A Lot 6 Click Score: 12    End of Session Equipment Utilized During Treatment: Gait  belt Activity Tolerance: Patient limited by pain Patient left: with family/visitor present;with nursing/sitter in room(Patient left on BSC with NA and pt's mother in room.) Nurse Communication: Mobility status PT Visit Diagnosis: Unsteadiness on feet (R26.81);Muscle weakness (generalized) (M62.81)    Time: 8119-1478 PT Time Calculation (min) (ACUTE ONLY): 28 min   Charges:   PT Evaluation $PT Eval Low Complexity: 1 Low PT Treatments $Therapeutic Activity: 8-22 mins        Glenetta Hew, PT, DPT   Glenetta Hew 08/31/2018, 2:46 PM

## 2018-08-31 NOTE — Plan of Care (Signed)
In/Out cath performed after the patient failed trial of void. A one time order for I/O performed. 500ml of urine was drained. The patient was given albumin and a bolus of 500ml of NS . She had an episode where her back  could not be straiten up to 90 degrees. The issue resolved after getting her up from the bedside commode into the bed. Dr. Everlene FarrierPabon was notified. No pain was reported was it was something usual for the patient as well.  Pain managed with PRN pain meds. Tachy today, MD was notified.  Problem: Education: Goal: Knowledge of General Education information will improve Description Including pain rating scale, medication(s)/side effects and non-pharmacologic comfort measures Outcome: Progressing   Problem: Health Behavior/Discharge Planning: Goal: Ability to manage health-related needs will improve Outcome: Progressing   Problem: Clinical Measurements: Goal: Ability to maintain clinical measurements within normal limits will improve Outcome: Progressing Goal: Will remain free from infection Outcome: Progressing Goal: Diagnostic test results will improve Outcome: Progressing Goal: Respiratory complications will improve Outcome: Progressing Goal: Cardiovascular complication will be avoided Outcome: Progressing   Problem: Activity: Goal: Risk for activity intolerance will decrease Outcome: Progressing   Problem: Nutrition: Goal: Adequate nutrition will be maintained Outcome: Progressing   Problem: Coping: Goal: Level of anxiety will decrease Outcome: Progressing   Problem: Elimination: Goal: Will not experience complications related to bowel motility Outcome: Progressing Goal: Will not experience complications related to urinary retention Outcome: Progressing   Problem: Pain Managment: Goal: General experience of comfort will improve Outcome: Progressing   Problem: Safety: Goal: Ability to remain free from injury will improve Outcome: Progressing   Problem: Skin  Integrity: Goal: Risk for impaired skin integrity will decrease Outcome: Progressing

## 2018-08-31 NOTE — Progress Notes (Deleted)
[facility] Progress Note  1 Day Post-Op  Subjective: No acute events overnight. She reports abdominal soreness but this is improved with pain medications. She denied any fever, chills, nausea, or emesis. No flatus. Has tolerated clear liquids to this point.   Objective: Vital signs in last 24 hours: Temp:  [97.6 F (36.4 C)-101.4 F (38.6 C)] 98.4 F (36.9 C) (12/04 0840) Pulse Rate:  [79-110] 105 (12/04 0840) Resp:  [10-20] 20 (12/04 0535) BP: (98-144)/(53-77) 108/53 (12/04 0840) SpO2:  [90 %-100 %] 92 % (12/04 0840) Last BM Date: 08/30/18  Intake/Output from previous day: 12/03 0701 - 12/04 0700 In: 3506.6 [P.O.:700; I.V.:2206.6; IV Piggyback:600] Out: 1130 [Urine:650; Drains:380; Blood:100] Intake/Output this shift: Total I/O In: -  Out: 428 [Urine:345; Drains:83]  PE: Gen:  Alert, NAD, pleasant Pulm:  Normal effort,  Abd: Soft, diffuse expected tenderness, non-distended, laparotomy incisions are all CDI no erythema or drainage. JP drains in RLQ and LLQ with serosanguinous fluid in bulbs.  Skin: warm and dry, no rashes  Psych: A&Ox3   Lab Results:  Recent Labs    08/30/18 1558 08/31/18 0352  WBC 12.2* 7.9  HGB 11.1* 10.1*  HCT 34.5* 31.3*  PLT 337 262   BMET Recent Labs    08/30/18 1558 08/31/18 0352  NA  --  136  K  --  3.8  CL  --  107  CO2  --  26  GLUCOSE  --  108*  BUN  --  14  CREATININE 1.07* 0.90  CALCIUM  --  7.7*   PT/INR No results for input(s): LABPROT, INR in the last 72 hours. CMP     Component Value Date/Time   NA 136 08/31/2018 0352   NA 143 07/11/2018 0933   K 3.8 08/31/2018 0352   CL 107 08/31/2018 0352   CO2 26 08/31/2018 0352   GLUCOSE 108 (H) 08/31/2018 0352   BUN 14 08/31/2018 0352   BUN 22 07/11/2018 0933   CREATININE 0.90 08/31/2018 0352   CALCIUM 7.7 (L) 08/31/2018 0352   PROT 7.4 07/11/2018 0933   ALBUMIN 4.4 07/11/2018 0933   AST 20 07/11/2018 0933   AST 19 11/12/2016 0000   ALT 14 07/11/2018  0933   ALT 15 11/12/2016 0000   ALKPHOS 103 07/11/2018 0933   BILITOT 0.2 07/11/2018 0933   GFRNONAA >60 08/31/2018 0352   GFRAA >60 08/31/2018 0352   Lipase     Component Value Date/Time   LIPASE 18 04/05/2018 1909       Studies/Results: No results found.  Anti-infectives: Anti-infectives (From admission, onward)   Start     Dose/Rate Route Frequency Ordered Stop   08/30/18 0600  ertapenem (INVANZ) 1,000 mg in sodium chloride 0.9 % 100 mL IVPB     1 g 200 mL/hr over 30 Minutes Intravenous On call to O.R. 08/29/18 2229 08/30/18 0940       Assessment/Plan  Colostomy Takedown Annette Miller is a 67 y.o. female who is 1 day s/p colostomy takedown.    - Clear liquid diet + IVF   - Monitor abdominal examination and on-going bowel function   - Pain control prn (minimize narcotics)    - Mobilization encouraged   - DVT prophylaxis  -- Lynden OxfordZachary Suri Tafolla , PA-C [facility] 08/31/2018, 8:58 AM [phone] M-F: 7am - 4pm

## 2018-08-31 NOTE — Progress Notes (Signed)
POD # 1 VSS She did have a temp last nigth Good U/P Labs ok JP serous fluid Only took po oxy, no need for dilaudid  PE NAD Abd: dressing intact. JP serous, no peritonitis. Mild incisional tendernss Ext: well perfused  A/P Fever likely reactive from surgery or from inflammatory reaction from mesh. Clinically doing very well and no clinical signs of leak. Abd is benign Ambulate DC foley I/S Replace mag

## 2018-09-01 ENCOUNTER — Encounter: Payer: Self-pay | Admitting: Radiology

## 2018-09-01 ENCOUNTER — Inpatient Hospital Stay: Payer: Medicare HMO

## 2018-09-01 DIAGNOSIS — E440 Moderate protein-calorie malnutrition: Secondary | ICD-10-CM

## 2018-09-01 LAB — URINE DRUG SCREEN, QUALITATIVE (ARMC ONLY)
Amphetamines, Ur Screen: NOT DETECTED
Barbiturates, Ur Screen: NOT DETECTED
Benzodiazepine, Ur Scrn: POSITIVE — AB
CANNABINOID 50 NG, UR ~~LOC~~: NOT DETECTED
Cocaine Metabolite,Ur ~~LOC~~: NOT DETECTED
MDMA (Ecstasy)Ur Screen: NOT DETECTED
Methadone Scn, Ur: NOT DETECTED
OPIATE, UR SCREEN: NOT DETECTED
Phencyclidine (PCP) Ur S: NOT DETECTED
Tricyclic, Ur Screen: POSITIVE — AB

## 2018-09-01 LAB — BASIC METABOLIC PANEL
Anion gap: 5 (ref 5–15)
BUN: 9 mg/dL (ref 8–23)
CO2: 26 mmol/L (ref 22–32)
Calcium: 8 mg/dL — ABNORMAL LOW (ref 8.9–10.3)
Chloride: 108 mmol/L (ref 98–111)
Creatinine, Ser: 0.63 mg/dL (ref 0.44–1.00)
GFR calc Af Amer: >60 mL/min (ref >60)
GFR calc non Af Amer: >60 mL/min (ref >60)
Glucose, Bld: 106 mg/dL — ABNORMAL HIGH (ref 70–99)
Potassium: 3.6 mmol/L (ref 3.5–5.1)
Sodium: 139 mmol/L (ref 135–145)

## 2018-09-01 LAB — MAGNESIUM: Magnesium: 2.2 mg/dL (ref 1.7–2.4)

## 2018-09-01 MED ORDER — IOPAMIDOL (ISOVUE-370) INJECTION 76%
300.0000 mL | Freq: Once | INTRAVENOUS | Status: AC | PRN
  Administered 2018-09-01: 300 mL

## 2018-09-01 MED ORDER — IOHEXOL 350 MG/ML SOLN
75.0000 mL | Freq: Once | INTRAVENOUS | Status: AC | PRN
  Administered 2018-09-01: 75 mL via INTRAVENOUS

## 2018-09-01 MED ORDER — PIPERACILLIN-TAZOBACTAM 3.375 G IVPB
3.3750 g | Freq: Three times a day (TID) | INTRAVENOUS | Status: DC
  Administered 2018-09-01 – 2018-09-03 (×6): 3.375 g via INTRAVENOUS
  Filled 2018-09-01 (×6): qty 50

## 2018-09-01 MED ORDER — PIPERACILLIN-TAZOBACTAM 3.375 G IVPB 30 MIN: 3.3750 g | Freq: Three times a day (TID) | INTRAVENOUS | Status: DC

## 2018-09-01 NOTE — Progress Notes (Signed)
Initial Nutrition Assessment  DOCUMENTATION CODES:   Non-severe (moderate) malnutrition in context of chronic illness  INTERVENTION:   Recommend Ensure Enlive po BID once diet advanced, each supplement provides 350 kcal and 20 grams of protein  Recommend MVI daily  RD will monitor for diet advancement vs the need for nutrition support   Recommend recheck, zinc, folate, copper and ceruloplasmin labs  NUTRITION DIAGNOSIS:   Moderate Malnutrition related to chronic illness(recent diveticulitis with perforation s/p Hartmanns ) as evidenced by mild fat depletion, moderate muscle depletion.  GOAL:   Patient will meet greater than or equal to 90% of their needs  MONITOR:   PO intake, Supplement acceptance, Labs, Weight trends, Skin, I & O's  REASON FOR ASSESSMENT:   Other (Comment)(low BMI)    ASSESSMENT:   67 y.o. female with a history of perforated sigmoid diverticulitis status post partial colectomy with colostomy 02/25/2018, recent admit in July for colitis and sepsis was found to have duodenal ulcer and was embolized at that time. Pt now admitted for colostomy takedown 12/3  Met with pt in room today. RD familiar with this pt from multiple previous admits. Pt previously on TPN from 7/11-7/14 while admitted with duodenal ulcer. Per chart, pt weighed ~110lbs in May around the time she had her Hartman's; pt lost down to 89lbs by the end of June and was diagnosed with severe malnutrition during her admit in July. Today, pt appears to have improved nutritional status. Pt has regained ~10lbs and has regained some fat and muscle stores. RD will upgrade pt from severe malnutrition to moderate malnutrition. Pt reports that she is no longer drinking any supplements at home but states "I am eating everything in sight". Pt has been on NPO/clear liquid diet since admit. RD will monitor for diet advancement vs the need for nutrition support. Pt is willing to drink vanilla Ensure once diet  advanced.   Of note, pt reported taking long term zinc during her last admit; this raised concerns about possible zinc and copper deficiencies as long term zinc supplementation can deplete copper. Zinc, copper and ceruloplasmin labs low during last admit; unsure if patient has been taking any supplementation other than a daily MVI. Would recommend recheck these labs at some point to to see if patient needs further supplementation or if labs have normalized. Pt continues to have macrocytic anemia which was also noted during last admit. Pt's B12 was wnl at that time; no folate was checked.   Medications reviewed and include: lovenox, protonix, LRS _0 /hr, zosyn  Labs reviewed: K 3.6 wnl, Mg 2.2 wnl P 3.3 wnl- 12/4 Hgb 10.1(L), Hct 31.3(L), MCV 106.8(H), MCH 34.5(H)- 12/4  NUTRITION - FOCUSED PHYSICAL EXAM:    Most Recent Value  Orbital Region  Mild depletion  Upper Arm Region  Mild depletion  Thoracic and Lumbar Region  Mild depletion  Buccal Region  Moderate depletion  Temple Region  Mild depletion  Clavicle Bone Region  Moderate depletion  Clavicle and Acromion Bone Region  Moderate depletion  Scapular Bone Region  Moderate depletion  Dorsal Hand  Moderate depletion  Patellar Region  Moderate depletion  Anterior Thigh Region  Moderate depletion  Posterior Calf Region  Moderate depletion  Edema (RD Assessment)  None  Hair  Reviewed  Eyes  Reviewed  Mouth  Reviewed  Skin  Reviewed  Nails  Reviewed     Diet Order:   Diet Order            Diet NPO time  specified  Diet effective now             EDUCATION NEEDS:   Education needs have been addressed  Skin:  Skin Assessment: Reviewed RN Assessment(incision abdomen and rectum )  Last BM:  12/5- small amount of type 7 per pt report  Height:   Ht Readings from Last 1 Encounters:  08/30/18 _0  (1.575 m)    Weight:   Wt Readings from Last 1 Encounters:  08/30/18 45.4 kg    Ideal Body Weight:  50 kg  BMI:  Body  mass index is 18.29 kg/m.  Estimated Nutritional Needs:   Kcal:  1300-1500kcal/day   Protein:  68-77g/day   Fluid:  >1.1L/day   Koleen Distance MS, RD, LDN Pager #- [phone] Office#- [phone] After Hours Pager: [phone]

## 2018-09-01 NOTE — Progress Notes (Signed)
POD # 2 Tachycardia Urine retention but making good Urine VSS Taking clears No emesis no flatus creatinine and BUN nml  PE NAD Abd: soft, appropriate incisional tenderness. Serous fluid from drain. No infection no enteric contents on drains   A/P Doing well but persistent tachy mandates r/o anastomotic leak w Gastro enema I would empirically start A/Bs Non toxic and no peritonitis no need for emergent intervention at this time D/W her that if the leak is significant she may need a diverting loop We will obtain EKG as wel

## 2018-09-01 NOTE — Progress Notes (Signed)
PT Cancellation Note  Patient Details Name: Annette Miller MRN: [id number] DOB: November 30, 1950   Cancelled Treatment:    Reason Eval/Treat Not Completed: Other (comment). Treatment attempted; pt notes being on a bed pan and states that since procedure pt has been unable to feel/control liquid waste from/bowel. Pt wishes to defer PT until this has resolved. Re attempt at a later time/date.    Annette Miller, PTA 09/01/2018, 12:19 PM

## 2018-09-02 LAB — COMPREHENSIVE METABOLIC PANEL
ALT: 11 U/L (ref 0–44)
AST: 22 U/L (ref 15–41)
Albumin: 2.7 g/dL — ABNORMAL LOW (ref 3.5–5.0)
Alkaline Phosphatase: 60 U/L (ref 38–126)
Anion gap: 5 (ref 5–15)
BUN: 6 mg/dL — ABNORMAL LOW (ref 8–23)
CALCIUM: 8 mg/dL — AB (ref 8.9–10.3)
CO2: 29 mmol/L (ref 22–32)
Chloride: 104 mmol/L (ref 98–111)
Creatinine, Ser: 0.69 mg/dL (ref 0.44–1.00)
GFR calc Af Amer: >60 mL/min (ref >60)
GFR calc non Af Amer: >60 mL/min (ref >60)
Glucose, Bld: 100 mg/dL — ABNORMAL HIGH (ref 70–99)
Potassium: 3.1 mmol/L — ABNORMAL LOW (ref 3.5–5.1)
Sodium: 138 mmol/L (ref 135–145)
Total Bilirubin: 0.8 mg/dL (ref 0.3–1.2)
Total Protein: 5.4 g/dL — ABNORMAL LOW (ref 6.5–8.1)

## 2018-09-02 LAB — CBC
HCT: 24.4 % — ABNORMAL LOW (ref 36.0–46.0)
Hemoglobin: 7.8 g/dL — ABNORMAL LOW (ref 12.0–15.0)
MCH: 33.9 pg (ref 26.0–34.0)
MCHC: 32 g/dL (ref 30.0–36.0)
MCV: 106.1 fL — ABNORMAL HIGH (ref 80.0–100.0)
Platelets: 238 10*3/uL (ref 150–400)
RBC: 2.3 MIL/uL — ABNORMAL LOW (ref 3.87–5.11)
RDW: 11 % — ABNORMAL LOW (ref 11.5–15.5)
WBC: 6.3 10*3/uL (ref 4.0–10.5)
nRBC: 0 % (ref 0.0–0.2)

## 2018-09-02 MED ORDER — BOOST / RESOURCE BREEZE PO LIQD CUSTOM: 1.0000 | Freq: Three times a day (TID) | ORAL | Status: DC

## 2018-09-02 MED ORDER — ENSURE ENLIVE PO LIQD
237.0000 mL | Freq: Two times a day (BID) | ORAL | Status: DC
  Administered 2018-09-02 – 2018-09-06 (×7): 237 mL via ORAL

## 2018-09-02 MED ORDER — POTASSIUM CHLORIDE CRYS ER 20 MEQ PO TBCR
20.0000 meq | EXTENDED_RELEASE_TABLET | Freq: Two times a day (BID) | ORAL | Status: DC
  Administered 2018-09-02 – 2018-09-06 (×9): 20 meq via ORAL
  Filled 2018-09-02 (×9): qty 1

## 2018-09-02 NOTE — Care Management (Signed)
Patient POD 3 ostomy take down.  Patient lives at home with son.  PCP Crissman.  Pharmacy [facility].  Denies issues obtaining medications.  Patient has BSC, RW, and shower seat in the home.  Patient is open with home health services through [facility] and would like to continue.  [name] with [facility] aware of admission.  Will require resumption orders for PT and RN.  RNCM following

## 2018-09-02 NOTE — Progress Notes (Signed)
PT Cancellation Note  Patient Details Name: Annette Miller MRN: [id number] DOB: 1951-04-04   Cancelled Treatment:    Reason Eval/Treat Not Completed: Other (comment).  Attempted to see pt for PT treatment session.  Upon PT arrival pt in bathroom taking a sponge bath and declined PT at this time.  Will continue to follow acutely.    Encarnacion ChuAshley Albirtha Grinage PT, DPT 09/02/2018, 3:24 PM

## 2018-09-02 NOTE — Anesthesia Postprocedure Evaluation (Signed)
Anesthesia Post Note  Patient: Annette Miller  Procedure(s) Performed: COLOSTOMY TAKEDOWN (N/A ) HERNIA REPAIR VENTRAL ADULT (N/A )  Patient location during evaluation: PACU Anesthesia Type: General Level of consciousness: awake and alert Pain management: pain level controlled Vital Signs Assessment: post-procedure vital signs reviewed and stable Respiratory status: spontaneous breathing, nonlabored ventilation, respiratory function stable and patient connected to nasal cannula oxygen Cardiovascular status: blood pressure returned to baseline and stable Postop Assessment: no apparent nausea or vomiting Anesthetic complications: no     Last Vitals:  Vitals:   09/02/18 0531 09/02/18 0532  BP: 134/62   Pulse: 88 94  Resp: 20   Temp: 36.9 C   SpO2: 92% 96%    Last Pain:  Vitals:   09/02/18 0531  TempSrc: Oral  PainSc:                  Jovita GammaKathryn L Fitzgerald

## 2018-09-02 NOTE — Progress Notes (Signed)
This patient has received 60 ml's of IV Omni 300 contrast extravasation into the Left AC during a CTA Chest exam.  The exam was performed on 09/01/2018  Site / affected area assessed by Dr. Allena KatzPatel

## 2018-09-02 NOTE — Care Management Important Message (Signed)
Copy of signed IM left with patient in room.  

## 2018-09-02 NOTE — Progress Notes (Signed)
[facility] Progress [facility]  3 Days Post-Op  Subjective: No acute events overnight. Tachycardia appears resolved, no evidence of leak or PE found yesterday  This morning, she reports abdominal soreness but this continues to improve. She denied any fever, chills, nausea, or emesis. She tolerated a clear liquid this morning without worsening pain. Endorses passing flatus and had bowel movements yesterday. Has not been mobilizing outside the room.   Objective: Vital signs in last 24 hours: Temp:  [98.2 F (36.8 C)-99 F (37.2 C)] 98.4 F (36.9 C) (12/06 0531) Pulse Rate:  [88-115] 94 (12/06 0532) Resp:  [18-20] 20 (12/06 0531) BP: (130-151)/(53-71) 134/62 (12/06 0531) SpO2:  [89 %-96 %] 96 % (12/06 0532) Last BM Date: 08/31/18  Intake/Output from previous day: 12/05 0701 - 12/06 0700 In: 1809.2 [I.V.:1713.8; IV Piggyback:95.3] Out: 1210 [Urine:1000; Drains:210] Intake/Output this shift: No intake/output data recorded.  PE: Gen:  Alert, NAD, pleasant Pulm:  Normal effort,  Abd: Soft, diffuse expected tenderness, non-distended, laparotomy incisions are all CDI no erythema, some drainage in the middle and lower portions of the midline incision. JP drains in RLQ and LLQ with serosanguinous fluid in bulbs.  Skin: warm and dry, no rashes  Psych: A&Ox3    Lab Results:  Recent Labs    08/31/18 0352 09/02/18 0300  WBC 7.9 6.3  HGB 10.1* 7.8*  HCT 31.3* 24.4*  PLT 262 238   BMET Recent Labs    09/01/18 0327 09/02/18 0300  NA 139 138  K 3.6 3.1*  CL 108 104  CO2 26 29  GLUCOSE 106* 100*  BUN 9 6*  CREATININE 0.63 0.69  CALCIUM 8.0* 8.0*   PT/INR No results for input(s): LABPROT, INR in the last 72 hours. CMP     Component Value Date/Time   NA 138 09/02/2018 0300   NA 143 07/11/2018 0933   K 3.1 (L) 09/02/2018 0300   CL 104 09/02/2018 0300   CO2 29 09/02/2018 0300   GLUCOSE 100 (H) 09/02/2018 0300   BUN 6 (L) 09/02/2018 0300   BUN 22 07/11/2018  0933   CREATININE 0.69 09/02/2018 0300   CALCIUM 8.0 (L) 09/02/2018 0300   PROT 5.4 (L) 09/02/2018 0300   PROT 7.4 07/11/2018 0933   ALBUMIN 2.7 (L) 09/02/2018 0300   ALBUMIN 4.4 07/11/2018 0933   AST 22 09/02/2018 0300   AST 19 11/12/2016 0000   ALT 11 09/02/2018 0300   ALT 15 11/12/2016 0000   ALKPHOS 60 09/02/2018 0300   BILITOT 0.8 09/02/2018 0300   BILITOT 0.2 07/11/2018 0933   GFRNONAA >60 09/02/2018 0300   GFRAA >60 09/02/2018 0300   Lipase     Component Value Date/Time   LIPASE 18 04/05/2018 1909       Studies/Results: Ct Angio Chest Pe W Or Wo Contrast  Result Date: 09/01/2018 CLINICAL DATA:  67 y/o F; tachycardia, hypoxia, 3 days status post colostomy takedown. EXAM: CT ANGIOGRAPHY CHEST WITH CONTRAST TECHNIQUE: Multidetector CT imaging of the chest was performed using the standard protocol during bolus administration of intravenous contrast. Multiplanar CT image reconstructions and MIPs were obtained to evaluate the vascular anatomy. 60 cc Omnipaque with extravasated the left antecubital IV site. A new IV was started on the right and scan repeated. CONTRAST:  75mL OMNIPAQUE IOHEXOL 350 MG/ML SOLN COMPARISON:  03/01/2018 CTA chest. FINDINGS: Cardiovascular: Satisfactory opacification of the pulmonary arteries to the segmental level. No evidence of pulmonary embolism. Normal heart size. No pericardial effusion. Mild coronary and aortic  calcific atherosclerosis. Mediastinum/Nodes: No enlarged mediastinal, hilar, or axillary lymph nodes. Thyroid gland, trachea, and esophagus demonstrate no significant findings. Lungs/Pleura: Ground-glass opacities with peripheral sparing in the left-greater-than-right upper lobes. Dependent consolidation within the right lung base. No pleural effusion or pneumothorax. Upper Abdomen: Small volume pneumoperitoneum in the upper abdomen likely related to recent colostomy takedown. Musculoskeletal: No chest wall abnormality. No acute or significant  osseous findings. Review of the MIP images confirms the above findings. IMPRESSION: 1. No pulmonary embolus identified. 2. Several ground-glass opacities with peripheral sparing in the left-greater-than-right upper lobes in a similar pattern to prior CTA chest, although significantly diminished. Findings may represent scarring/fibrosis from prior pneumonitis or recurrent disease. 3. Dependent consolidation in the right lung base, probably atelectasis, less likely pneumonia. 4. Small volume pneumoperitoneum in upper abdomen likely related to recent colostomy Electronically Signed   By: Mitzi Hansen M.D.   On: 09/01/2018 17:47   Dg Colon W/water Sol Cm  Result Date: 09/01/2018 CLINICAL DATA:  Status post colostomy takedown yesterday. EXAM: COLON WITH WATER SOLUTION CONTRAST COMPARISON:  None. FINDINGS: KUB: Gaseous distention of small bowel and colon likely reflecting ileus given recent postsurgical disposition. Surgical drains are in place. Anastomotic suture line is noted in the pelvis. Water-soluble enema: An enema catheter was inserted and retention balloon distended under fluoroscopy. Diluted Isovue 370 contrast was gravity infuse into the rectosigmoid colon. Contrast traverses the anastomosis without delay or stricture. No extraluminal contrast is identified. Slight irregularity of the colon adjacent to the anastomosis likely reflecting postsurgical changes and mucosal edema. IMPRESSION: Interval reanastomosis of the colon without extraluminal contrast to suggest leak or perforation. Electronically Signed   By: Elige Ko   On: 09/01/2018 10:01    Anti-infectives: Anti-infectives (From admission, onward)   Start     Dose/Rate Route Frequency Ordered Stop   09/01/18 0800  piperacillin-tazobactam (ZOSYN) IVPB 3.375 g     3.375 g 12.5 mL/hr over 240 Minutes Intravenous Every 8 hours 09/01/18 0744     09/01/18 0745  piperacillin-tazobactam (ZOSYN) IVPB 3.375 g  Status:  Discontinued      3.375 g 100 mL/hr over 30 Minutes Intravenous Every 8 hours 09/01/18 0738 09/01/18 0744   08/30/18 0600  ertapenem (INVANZ) 1,000 mg in sodium chloride 0.9 % 100 mL IVPB     1 g 200 mL/hr over 30 Minutes Intravenous On call to O.R. 08/29/18 2229 08/30/18 0940       Assessment/Plan  Perforated Colitis s/p Hartman's Procedure Annette Miller is a 67 y.o. female who is overall doing better today after persistent tachycardia the day prior otherwise with hypokalemia who is 3 days s/p colostomy takedown with end-to-end anastomosis and abdominal wall reconstruction.     - Restart clear liquids + nutritional supplementation, IVF   - Monitor abdominal examination and on-going bowel function   - Pain control as needed (minimize narcotics)   - Replace potassium, monitor   - Continue to monitor for recurrent tachycardia. No evidence of anastomotic leak on gastrografin exam yesterday, no evidence of PE.If tachycardia persists, will place on cardiac monitoring and consult medicine   - Mobilization   - DVT Prophylaxis  -- Lynden Oxford , PA-C [facility] 09/02/2018, 8:02 AM [phone] M-F: 7am - 4pm

## 2018-09-03 LAB — CBC
HCT: 24.4 % — ABNORMAL LOW (ref 36.0–46.0)
HEMOGLOBIN: 7.8 g/dL — AB (ref 12.0–15.0)
MCH: 33.9 pg (ref 26.0–34.0)
MCHC: 32 g/dL (ref 30.0–36.0)
MCV: 106.1 fL — AB (ref 80.0–100.0)
Platelets: 258 10*3/uL (ref 150–400)
RBC: 2.3 MIL/uL — ABNORMAL LOW (ref 3.87–5.11)
RDW: 11 % — ABNORMAL LOW (ref 11.5–15.5)
WBC: 4.8 10*3/uL (ref 4.0–10.5)
nRBC: 0 % (ref 0.0–0.2)

## 2018-09-03 LAB — COMPREHENSIVE METABOLIC PANEL
ALT: 10 U/L (ref 0–44)
AST: 17 U/L (ref 15–41)
Albumin: 2.5 g/dL — ABNORMAL LOW (ref 3.5–5.0)
Alkaline Phosphatase: 54 U/L (ref 38–126)
Anion gap: 7 (ref 5–15)
BUN: 5 mg/dL — ABNORMAL LOW (ref 8–23)
CALCIUM: 8.1 mg/dL — AB (ref 8.9–10.3)
CO2: 27 mmol/L (ref 22–32)
Chloride: 107 mmol/L (ref 98–111)
Creatinine, Ser: 0.68 mg/dL (ref 0.44–1.00)
GFR calc Af Amer: >60 mL/min (ref >60)
Glucose, Bld: 87 mg/dL (ref 70–99)
Potassium: 3.5 mmol/L (ref 3.5–5.1)
Sodium: 141 mmol/L (ref 135–145)
Total Bilirubin: 0.3 mg/dL (ref 0.3–1.2)
Total Protein: 5.2 g/dL — ABNORMAL LOW (ref 6.5–8.1)

## 2018-09-03 NOTE — Progress Notes (Signed)
   09/03/18 1100  Clinical Encounter Type  Visited With Patient not available (Patient is sleeping. Will try again later.)

## 2018-09-03 NOTE — Progress Notes (Signed)
Subjective:  CC:  Annette Miller is a 67 y.o. female  Hospital stay day 4, 4 Days Post-Op incisonal hernia repair and ostomy takedown  HPI: Doing well.  Tolerated full liquid diet, pain controlled.  ROS:  A 5 point review of systems was performed and pertinent positives and negatives noted in HPI.   Objective:      Temp:  [98.1 F (36.7 C)-100.3 F (37.9 C)] 100.3 F (37.9 C) (12/07 2048) Pulse Rate:  [101-112] 112 (12/07 2048) Resp:  [18-20] 20 (12/07 2048) BP: (131-147)/(58-78) 131/58 (12/07 2048) SpO2:  [95 %-96 %] 96 % (12/07 2048)     Height: 5\' 2"  (157.5 cm) Weight: 45.4 kg BMI (Calculated): 18.29   Intake/Output this shift:   Intake/Output Summary (Last 24 hours) at 09/03/2018 2058 Last data filed at 09/03/2018 1942 Gross per 24 hour  Intake 1068.66 ml  Output 325 ml  Net 743.66 ml        Constitutional :  alert, cooperative, appears stated age and no distress  Respiratory:  clear to auscultation bilaterally  Cardiovascular:  regular rate and rhythm  Gastrointestinal: soft, non-tender; bowel sounds normal; no masses,  no organomegaly.   Skin: Cool and moist. Staples lines c/d/i, slight distention along midline.  Psychiatric: Normal affect, non-agitated, not confused       LABS:  CMP Latest Ref Rng & Units 09/03/2018 09/02/2018 09/01/2018  Glucose 70 - 99 mg/dL 87 161(W100(H) 960(A106(H)  BUN 8 - 23 mg/dL 5(L) 6(L) 9  Creatinine 0.44 - 1.00 mg/dL 5.400.68 9.810.69 1.910.63  Sodium 135 - 145 mmol/L 141 138 139  Potassium 3.5 - 5.1 mmol/L 3.5 3.1(L) 3.6  Chloride 98 - 111 mmol/L 107 104 108  CO2 22 - 32 mmol/L 27 29 26   Calcium 8.9 - 10.3 mg/dL 8.1(L) 8.0(L) 8.0(L)  Total Protein 6.5 - 8.1 g/dL 5.2(L) 5.4(L) -  Total Bilirubin 0.3 - 1.2 mg/dL 0.3 0.8 -  Alkaline Phos 38 - 126 U/L 54 60 -  AST 15 - 41 U/L 17 22 -  ALT 0 - 44 U/L 10 11 -   CBC Latest Ref Rng & Units 09/03/2018 09/02/2018 08/31/2018  WBC 4.0 - 10.5 K/uL 4.8 6.3 7.9  Hemoglobin 12.0 - 15.0 g/dL 7.8(L) 7.8(L)  10.1(L)  Hematocrit 36.0 - 46.0 % 24.4(L) 24.4(L) 31.3(L)  Platelets 150 - 400 K/uL 258 238 262    RADS: n/a Assessment:    incisional hernia repair and ostomy takedown.  Doing well. Advance to soft.  No sign of leak or infection at this point.  Will discontinue abx and monitor along with tachycardia.  Clinically looking well.

## 2018-09-04 ENCOUNTER — Inpatient Hospital Stay: Payer: Medicare HMO

## 2018-09-04 NOTE — Progress Notes (Signed)
Subjective:  CC:  Annette Miller is a 67 y.o. female  Hospital stay day 5, 5 Days Post-Op incisonal hernia repair and ostomy takedown  HPI: Doing well.  Tolerated soft diet, small BM and passing flatus.  ROS:  General: Denies weight loss, weight gain, fatigue, fevers, chills, and night sweats. Heart: Denies chest pain, palpitations, racing heart, irregular heartbeat, leg pain or swelling, and decreased activity tolerance. Respiratory: Denies breathing difficulty, shortness of breath, wheezing, cough, and sputum. GI: Denies change in appetite, heartburn, nausea, vomiting, constipation, diarrhea, and blood in stool. GU: Denies difficulty urinating, pain with urinating, urgency, frequency, blood in urine, and heavy menstrual bleeding.   pertinent positives and negatives noted in HPI.   Objective:      Temp:  [98.2 F (36.8 C)-100.3 F (37.9 C)] 98.5 F (36.9 C) (12/08 0435) Pulse Rate:  [95-112] 95 (12/08 0435) Resp:  [18-20] 18 (12/08 0435) BP: (130-147)/(58-75) 130/64 (12/08 0435) SpO2:  [94 %-96 %] 94 % (12/08 0435)     Height: 5\' 2"  (157.5 cm) Weight: 45.4 kg BMI (Calculated): 18.29   Intake/Output this shift:   Intake/Output Summary (Last 24 hours) at 09/04/2018 1101 Last data filed at 09/04/2018 1019 Gross per 24 hour  Intake 120 ml  Output 415 ml  Net -295 ml        Constitutional :  alert, cooperative, appears stated age and no distress  Respiratory:  clear to auscultation bilaterally  Cardiovascular:  regular rate and rhythm  Gastrointestinal: soft, non-tender but obvious increased distention compared to yesterday, still non-tender.   Skin: Cool and moist. Staples lines c/d/i, slight distention along midline.  Psychiatric: Normal affect, non-agitated, not confused       LABS:  CMP Latest Ref Rng & Units 09/03/2018 09/02/2018 09/01/2018  Glucose 70 - 99 mg/dL 87 161(W100(H) 960(A106(H)  BUN 8 - 23 mg/dL 5(L) 6(L) 9  Creatinine 0.44 - 1.00 mg/dL 5.400.68 9.810.69 1.910.63  Sodium  135 - 145 mmol/L 141 138 139  Potassium 3.5 - 5.1 mmol/L 3.5 3.1(L) 3.6  Chloride 98 - 111 mmol/L 107 104 108  CO2 22 - 32 mmol/L 27 29 26   Calcium 8.9 - 10.3 mg/dL 8.1(L) 8.0(L) 8.0(L)  Total Protein 6.5 - 8.1 g/dL 5.2(L) 5.4(L) -  Total Bilirubin 0.3 - 1.2 mg/dL 0.3 0.8 -  Alkaline Phos 38 - 126 U/L 54 60 -  AST 15 - 41 U/L 17 22 -  ALT 0 - 44 U/L 10 11 -   CBC Latest Ref Rng & Units 09/03/2018 09/02/2018 08/31/2018  WBC 4.0 - 10.5 K/uL 4.8 6.3 7.9  Hemoglobin 12.0 - 15.0 g/dL 7.8(L) 7.8(L) 10.1(L)  Hematocrit 36.0 - 46.0 % 24.4(L) 24.4(L) 31.3(L)  Platelets 150 - 400 K/uL 258 238 262    RADS: n/a Assessment:    incisional hernia repair and ostomy takedown.  Doing well. Advanced to soft, and patient reports feeling ok but noticeably increased distention today.  Will continue to monitor today and get baseline KUB.  I did advise patient to stop PO intake if she starts developing nausea/vomiting, increased hiccups.

## 2018-09-05 LAB — CBC
HCT: 26.9 % — ABNORMAL LOW (ref 36.0–46.0)
Hemoglobin: 8.5 g/dL — ABNORMAL LOW (ref 12.0–15.0)
MCH: 34.1 pg — ABNORMAL HIGH (ref 26.0–34.0)
MCHC: 31.6 g/dL (ref 30.0–36.0)
MCV: 108 fL — ABNORMAL HIGH (ref 80.0–100.0)
Platelets: 308 10*3/uL (ref 150–400)
RBC: 2.49 MIL/uL — ABNORMAL LOW (ref 3.87–5.11)
RDW: 11.3 % — ABNORMAL LOW (ref 11.5–15.5)
WBC: 5.7 10*3/uL (ref 4.0–10.5)
nRBC: 0 % (ref 0.0–0.2)

## 2018-09-05 LAB — BASIC METABOLIC PANEL
Anion gap: 7 (ref 5–15)
BUN: 9 mg/dL (ref 8–23)
CO2: 25 mmol/L (ref 22–32)
Calcium: 8.2 mg/dL — ABNORMAL LOW (ref 8.9–10.3)
Chloride: 103 mmol/L (ref 98–111)
Creatinine, Ser: 0.76 mg/dL (ref 0.44–1.00)
GFR calc Af Amer: >60 mL/min (ref >60)
GFR calc non Af Amer: >60 mL/min (ref >60)
Glucose, Bld: 124 mg/dL — ABNORMAL HIGH (ref 70–99)
Potassium: 3.9 mmol/L (ref 3.5–5.1)
Sodium: 135 mmol/L (ref 135–145)

## 2018-09-05 NOTE — Progress Notes (Addendum)
[facility] Progress Note  6 Days Post-Op  Subjective: No acute events overnight. She reports a "pulling" sensation in her RLQ which is intermittent in nature and can be exacerbated with movement. She reports this started yesterday. No increase in pain with eating. No fever, chills , nausea, or emesis. Continues to pass flatus and last reported BM yesterday. She did has a KUB which was concerning for adynamic ileus. Additionally, she continues to be intermittently tachycardic but denied any CP, palpitations, or SOB.   Objective: Vital signs in last 24 hours: Temp:  [98 F (36.7 C)-98.5 F (36.9 C)] 98.5 F (36.9 C) (12/09 0406) Pulse Rate:  [94-111] 111 (12/09 0406) Resp:  [17-18] 18 (12/09 0406) BP: (98-127)/(30-65) 127/64 (12/09 0406) SpO2:  [91 %-97 %] 96 % (12/09 0406) Last BM Date: 09/04/18  Intake/Output from previous day: 12/08 0701 - 12/09 0700 In: 356 [P.O.:356] Out: 525 [Urine:350; Drains:175] Intake/Output this shift: Total I/O In: 10 [Other:10] Out: -   PE: Gen:  Alert, NAD, pleasant Card:  Tachycardic, regular rhythm Pulm:  Normal effort, clear to auscultation bilaterally Abd: Soft, mild tenderness in RLQ, appears diffusely distended especially around midline incision, JP in RLQ and LLQ with minimal serosanguinous fluid in bulb, drain sites are without erythema or drainage.  Skin: Midline laparotomy incision is CDI, no erythema or drainage. LUQ incisions is CDI, no erythema or drainage. Staples in both incisions.  Psych: A&Ox3   Lab Results:  Recent Labs    09/03/18 0508  WBC 4.8  HGB 7.8*  HCT 24.4*  PLT 258   BMET Recent Labs    09/03/18 0508  NA 141  K 3.5  CL 107  CO2 27  GLUCOSE 87  BUN 5*  CREATININE 0.68  CALCIUM 8.1*   PT/INR No results for input(s): LABPROT, INR in the last 72 hours. CMP     Component Value Date/Time   NA 141 09/03/2018 0508   NA 143 07/11/2018 0933   K 3.5 09/03/2018 0508   CL 107 09/03/2018  0508   CO2 27 09/03/2018 0508   GLUCOSE 87 09/03/2018 0508   BUN 5 (L) 09/03/2018 0508   BUN 22 07/11/2018 0933   CREATININE 0.68 09/03/2018 0508   CALCIUM 8.1 (L) 09/03/2018 0508   PROT 5.2 (L) 09/03/2018 0508   PROT 7.4 07/11/2018 0933   ALBUMIN 2.5 (L) 09/03/2018 0508   ALBUMIN 4.4 07/11/2018 0933   AST 17 09/03/2018 0508   AST 19 11/12/2016 0000   ALT 10 09/03/2018 0508   ALT 15 11/12/2016 0000   ALKPHOS 54 09/03/2018 0508   BILITOT 0.3 09/03/2018 0508   BILITOT 0.2 07/11/2018 0933   GFRNONAA >60 09/03/2018 0508   GFRAA >60 09/03/2018 0508   Lipase     Component Value Date/Time   LIPASE 18 04/05/2018 1909       Studies/Results: Dg Abd Portable 1v  Result Date: 09/04/2018 CLINICAL DATA:  Abdominal distension, 6 day postop ostomy take-down EXAM: PORTABLE ABDOMEN - 1 VIEW COMPARISON:  09/01/2018 FINDINGS: Gaseous distention of bowel in the central abdomen. Additional loops of colon are also mildly prominent. This appearance suggests adynamic ileus. Contrast opacifies the ascending colon and rectum. Skin staples overlying the central abdomen and left mid abdomen. Surgical sutures overlying the pelvis. Embolization coils in the right upper abdomen. Surgical drains in the upper/mid abdomen. Visualized osseous structures are within normal limits. IMPRESSION: Mild gaseous distention of bowel with contrast in the colon, likely reflecting adynamic ileus. Postsurgical changes,  as above. Electronically Signed   By: Charline Bills M.D.   On: 09/04/2018 13:57    Anti-infectives: Anti-infectives (From admission, onward)   Start     Dose/Rate Route Frequency Ordered Stop   09/01/18 0800  piperacillin-tazobactam (ZOSYN) IVPB 3.375 g  Status:  Discontinued     3.375 g 12.5 mL/hr over 240 Minutes Intravenous Every 8 hours 09/01/18 0744 09/03/18 1006   09/01/18 0745  piperacillin-tazobactam (ZOSYN) IVPB 3.375 g  Status:  Discontinued     3.375 g 100 mL/hr over 30 Minutes Intravenous  Every 8 hours 09/01/18 0738 09/01/18 0744   08/30/18 0600  ertapenem (INVANZ) 1,000 mg in sodium chloride 0.9 % 100 mL IVPB     1 g 200 mL/hr over 30 Minutes Intravenous On call to O.R. 08/29/18 2229 08/30/18 0940       Assessment/Plan  Perforated Colitis s/p Hartman's Procedure Annette Miller is a 67 y.o. female who is overall clinically stable today with persistent intermittent tachycardia who is 6 days s/p colostomy takedown with end-to-end anastomosis and abdominal wall reconstruction.     - Continue Soft Diet   - Will re-check CBC and BMP   - Continue JP drains   - Will add 5mg  Flexeril q8 for pain   - Monitor abdominal examination and on-going bowel function                         - Pain control as needed (minimize narcotics)                         - Continue to monitor for recurrent tachycardia                         - Mobilization                         - DVT Prophylaxis  -- Lynden Oxford , PA-C [facility] 09/05/2018, 9:29 AM [phone] M-F: 7am - 4pm

## 2018-09-05 NOTE — Progress Notes (Signed)
Physical Therapy Treatment Patient Details Name: Annette Miller MRN: [id number] DOB: [birth year] Today's Date: 09/05/2018    History of Present Illness Patient is a 67 year old female admitted s/p colostomy takedown (RLQ, LLQ).  PMH includes osteoporosis, HA, goiter, depression, anxiety, anemia, perforated diverticulitis s/p Hartman's.      PT Comments    Annette Miller reported abdominal pain at incision site as well as nausea but was agreeable to working with therapy on balance training.  Session ultimately limited by abdominal pain, RN notified.  Per pt and RN report, pt has been ambulating in hallway earlier in the day, encouraged pt to continue with this activity as she can tolerate.   Follow Up Recommendations  Home health PT;Supervision for mobility/OOB     Equipment Recommendations  Cane(for stability and comfort during recovery process)    Recommendations for Other Services       Precautions / Restrictions Precautions Precautions: Fall;Other (comment) Precaution Comments: two drains and abdominal incision Restrictions Weight Bearing Restrictions: No    Mobility  Bed Mobility Overal bed mobility: Needs Assistance Bed Mobility: Supine to Sit;Sit to Supine     Supine to sit: Min assist;HOB elevated Sit to supine: Min guard   General bed mobility comments: Cues and education for bed roll but pt declines and requests to have assist with UEs to pull into sitting for supine>sit.  Pt does demonstrate log roll to return to supine.  Further education provided regarding purpose of log roll and it's roll in pain control with bed mobility.    Transfers Overall transfer level: Needs assistance Equipment used: None Transfers: Sit to/from Stand Sit to Stand: Min guard         General transfer comment: Pt is slow to rise and demonstrates guarded posture.  Min instability but no LOB.   Ambulation/Gait Ambulation/Gait assistance: Supervision Gait Distance (Feet): 20  Feet Assistive device: None Gait Pattern/deviations: Decreased step length - right;Decreased step length - left Gait velocity: decreased   General Gait Details: Guaded posture due to abdominal pain.  Supervision for safety.  Pt with dec gait speed and dec step length Bil.    Stairs             Wheelchair Mobility    Modified Rankin (Stroke Patients Only)       Balance Overall balance assessment: Needs assistance Sitting-balance support: No upper extremity supported;Feet supported Sitting balance-Leahy Scale: Good     Standing balance support: No upper extremity supported;During functional activity Standing balance-Leahy Scale: Fair Standing balance comment: Pt able to stand and ambulate without AD but demonstrates LOB with balance testing as documented below                            Cognition Arousal/Alertness: Awake/alert Behavior During Therapy: WFL for tasks assessed/performed Overall Cognitive Status: Within Functional Limits for tasks assessed                                        Exercises Other Exercises Other Exercises: Balance exercises including: eyes closed, tandem stance, rhomberg stance with eyes open, tandem walking, 360 deg turns, reaching.     General Comments General comments (skin integrity, edema, etc.): Limited session due to abdminal pain      Pertinent Vitals/Pain Pain Assessment: 0-10 Pain Score: (12/10) Pain Location: Abdominal incision Pain Descriptors / Indicators: Moaning;Guarding;Grimacing  Pain Intervention(s): Limited activity within patient's tolerance;Monitored during session;Utilized relaxation techniques    Home Living                      Prior Function            PT Goals (current goals can now be found in the care plan section) Acute Rehab PT Goals Patient Stated Goal: decreased pain and to feel better PT Goal Formulation: With patient Time For Goal Achievement:  09/14/18 Potential to Achieve Goals: Good Progress towards PT goals: Progressing toward goals    Frequency    Min 2X/week      PT Plan Current plan remains appropriate    Co-evaluation              AM-PAC PT "6 Clicks" Mobility   Outcome Measure  Help needed turning from your back to your side while in a flat bed without using bedrails?: A Little Help needed moving from lying on your back to sitting on the side of a flat bed without using bedrails?: A Little Help needed moving to and from a bed to a chair (including a wheelchair)?: A Little Help needed standing up from a chair using your arms (e.g., wheelchair or bedside chair)?: A Little Help needed to walk in hospital room?: A Little Help needed climbing 3-5 steps with a railing? : A Little 6 Click Score: 18    End of Session   Activity Tolerance: Patient limited by pain Patient left: in bed;with call bell/phone within reach Nurse Communication: Mobility status;Other (comment)(pt's 12/10 abdominal pain as well as nausea) PT Visit Diagnosis: Unsteadiness on feet (R26.81);Muscle weakness (generalized) (M62.81)     Time: [id number] PT Time Calculation (min) (ACUTE ONLY): 15 min  Charges:  $Neuromuscular Re-education: 8-22 mins                     Annette Miller PT, DPT 09/05/2018, 12:26 PM

## 2018-09-05 NOTE — Care Management Important Message (Signed)
Copy of signed IM left with patient in room.  

## 2018-09-06 LAB — CREATININE, SERUM
Creatinine, Ser: 0.72 mg/dL (ref 0.44–1.00)
GFR calc Af Amer: >60 mL/min (ref >60)
GFR calc non Af Amer: >60 mL/min (ref >60)

## 2018-09-06 MED ORDER — CYCLOBENZAPRINE HCL 5 MG PO TABS: 5.0000 mg | ORAL_TABLET | Freq: Three times a day (TID) | ORAL | 0 refills | Status: DC

## 2018-09-06 MED ORDER — OXYCODONE HCL 5 MG PO TABS: 5.0000 mg | ORAL_TABLET | ORAL | 0 refills | Status: DC | PRN

## 2018-09-06 MED ORDER — GABAPENTIN 600 MG PO TABS: 300.0000 mg | ORAL_TABLET | Freq: Three times a day (TID) | ORAL | 0 refills | Status: DC

## 2018-09-06 NOTE — Discharge Instructions (Signed)
In addition to included general post-operative instructions for colostomy takedown,  Diet: Resume home heart healthy diet.   Activity: No heavy lifting >20 pounds (children, pets, laundry, garbage) or strenuous activity until follow-up, but light activity and walking are encouraged. Do not drive or drink alcohol if taking narcotic pain medications.  Wound care: You may shower/get incision wet with soapy water and pat dry (do not rub incisions), but no baths or submerging incision underwater until follow-up.   Medications: For mild to moderate pain: acetaminophen (Tylenol) or ibuprofen/naproxen (if no kidney disease). Combining Tylenol with alcohol can substantially increase your risk of causing liver disease. Narcotic pain medications, if prescribed, can be used for severe pain, though may cause nausea, constipation, and drowsiness. Do not combine Tylenol and Percocet (or similar) within a 6 hour period as Percocet (and similar) contain(s) Tylenol. If you do not need the narcotic pain medication, you do not need to fill the prescription.  Call office [phone] / [phone]) at any time if any questions, worsening pain, fevers/chills, bleeding, drainage from incision site, or other concerns.

## 2018-09-06 NOTE — Progress Notes (Addendum)
Discharge instructions reviewed with patient including followup visits, new medications and drain management.  Understanding was verbalized and all questions were answered.  IV and RLQ JP removed without complication; patient tolerated well.  Patient discharged home via wheelchair in stable condition escorted by volunteer staff.

## 2018-09-06 NOTE — Progress Notes (Signed)
Nutrition Follow Up Note   DOCUMENTATION CODES:   Non-severe (moderate) malnutrition in context of chronic illness  INTERVENTION:   Continue Ensure Enlive po BID once diet advanced, each supplement provides 350 kcal and 20 grams of protein  Recommend MVI daily  Recommend recheck, zinc, folate, copper and ceruloplasmin labs   NUTRITION DIAGNOSIS:   Moderate Malnutrition related to chronic illness(recent diveticulitis with perforation s/p Hartmanns ) as evidenced by mild fat depletion, moderate muscle depletion.  GOAL:   Patient will meet greater than or equal to 90% of their needs  -progressing   MONITOR:   PO intake, Supplement acceptance, Labs, Weight trends, Skin, I & O's  ASSESSMENT:   67 y.o. female with a history of perforated sigmoid diverticulitis status post partial colectomy with colostomy 02/25/2018, recent admit in July for colitis and sepsis was found to have duodenal ulcer and was embolized at that time. Pt now admitted for colostomy takedown 12/3  Met with pt in room today. Pt reports continued post op abdominal pain today but reports this is improved. Pt reports her appetite remains good. Pt eating ~50% of her soft diet and drinking Ensure. No new weight since admit; will request new weight. Recommend continue supplements and MVI after discharge as pt's nutritional status is improving.   Of note, pt reported taking long term zinc during her last admit; this raised concerns about possible zinc and copper deficiencies as long term zinc supplementation can deplete copper. Zinc, copper and ceruloplasmin labs low during last admit; unsure if patient has been taking any supplementation other than a daily MVI. Would recommend recheck these labs at some point to to see if patient needs further supplementation or if labs have normalized. Pt continues to have macrocytic anemia which was also noted during last admit. Pt's B12 was wnl at that time; no folate was checked.    Medications reviewed and include: lovenox, protonix, KCl, oxycodone   Labs reviewed:   Diet Order:   Diet Order            DIET SOFT Room service appropriate? Yes; Fluid consistency: Thin  Diet effective now             EDUCATION NEEDS:   Education needs have been addressed  Skin:  Skin Assessment: Reviewed RN Assessment(incision abdomen and rectum )  Last BM:  12/9  Height:   Ht Readings from Last 1 Encounters:  08/30/18 '5\' 2"'$  (1.575 m)    Weight:   Wt Readings from Last 1 Encounters:  08/30/18 45.4 kg    Ideal Body Weight:  50 kg  BMI:  Body mass index is 18.29 kg/m.  Estimated Nutritional Needs:   Kcal:  1300-1500kcal/day   Protein:  68-77g/day   Fluid:  >1.1L/day   Koleen Distance MS, RD, LDN Pager #- [phone] Office#- [phone] After Hours Pager: [phone]

## 2018-09-06 NOTE — Care Management Note (Signed)
Case Management Note  Patient Details  Name: Annette Miller MRN: [id number] Date of Birth: [birth year]   Patient discharging today with resumption of home health orders. [name] with [facility] notified of discharge.   Subjective/Objective:                    Action/Plan:   Expected Discharge Date:  09/06/18               Expected Discharge Plan:  Home w Home Health Services  In-House Referral:     Discharge planning Services  CM Consult  Post Acute Care Choice:  Resumption of Svcs/PTA Provider Choice offered to:     DME Arranged:    DME Agency:     HH Arranged:  RN, PT HH Agency:  [facility]  Status of Service:  Completed, signed off  If discussed at Long Length of Stay Meetings, dates discussed:    Additional Comments:  Chapman FitchBOWEN, Rilan Eiland T, RN 09/06/2018, 2:51 PM

## 2018-09-06 NOTE — Discharge Summary (Addendum)
Discharge Summary  Patient ID: Annette Miller MRN: [id number] DOB/AGE: 1951-02-08 67 y.o.  Admit date: 08/30/2018 Discharge date: 09/06/2018  Discharge Diagnoses S/P Colostomy Takedown  Consultants None  Procedures 1. Laparotomy with extensive LOA taking about 60 min total operative time 2. Colostomy takedown EEA colocolostomy 3. Takedown of splenic flexure 4. Abdominal wall reconstruction with bilateral Myocutaneous flaps ( component separation). On the Left TAR release and on the right Rives-Stoppa with placement of Phasix ST mesh 25x30cm  HPI: Annette Miller is a 67 y.o. female with a history of perforated colitis requiring hartman's procedure several months ago who presents to [facility] on 12/03 for scheduled colostomy takedown with Dr Everlene Farrier, MD.   Hospital Course: Informed consent was obtained and documented, and patient underwent uneventful Laparotomy with extensive LOA taking about 60 min total operative time, Colostomy takedown EEA colocolostomy, Takedown of splenic flexure, and Abdominal wall reconstruction with bilateral Myocutaneous flaps (Dr Everlene Farrier, MD, 08/30/2018).  Post-operatively, there was concern for persistent tachycardia on POD1-2. She was worked up with a gastrografin challenge which did not show anastomotic leak, a CTA Chest which did not reveal any PE, and a UDS which was not concerning. She remained afebrile throughout this time, and no clear cause for her tachycardia was found. Otherwise, the patient's pain improved/resolved and advancement of patient's diet and ambulation were well-tolerated. The remainder of patient's hospital course was essentially unremarkable, and discharge planning was initiated accordingly with patient safely able to be discharged home with appropriate discharge instructions, pain control, and outpatient follow-up after all of her questions were answered to her expressed satisfaction.  Discharge condition: Good  Physical Examination:  Gen:  Alert,  NAD, pleasant Card:  Tachycardic, regular rhythm Pulm:  Normal effort, clear to auscultation bilaterally Abd: Soft, non-tender, distension seen yesterday improved, JP in RLQ and LLQ with minimal serosanguinous fluid in bulb, drain sites are without erythema or drainage.  Skin: Midline laparotomy incision is CDI, no erythema or drainage. LUQ incisions is CDI, no erythema or drainage. Staples in both incisions.  Psych: A&Ox3     Allergies as of 09/06/2018      Reactions   Aspirin    HISTORY OF PUD, DIVERTICULITIS, BOWEL PERFORATION   Prednisone Other (See Comments)   Reaction: violence Can not take any kind of steroids   Codeine Diarrhea, Nausea And Vomiting   Tape Rash   Transpore tape: redness, irritation at site  Tegaderm somewhat okay. All tapes cause itching      Medication List    TAKE these medications   acetaminophen 325 MG tablet Commonly known as:  TYLENOL Take 650 mg by mouth every 6 (six) hours as needed for mild pain or fever.   buPROPion 150 MG 12 hr tablet Commonly known as:  WELLBUTRIN SR Take 1 tablet (150 mg total) by mouth 2 (two) times daily.   cyclobenzaprine 5 MG tablet Commonly known as:  FLEXERIL Take 1 tablet (5 mg total) by mouth 3 (three) times daily.   diazepam 5 MG tablet Commonly known as:  VALIUM Take 1 tablet (5 mg total) by mouth 2 (two) times daily.   fexofenadine 180 MG tablet Commonly known as:  ALLEGRA TAKE 1 TABLET (180 MG TOTAL) BY MOUTH DAILY. What changed:  See the new instructions.   gabapentin 600 MG tablet Commonly known as:  NEURONTIN Take 0.5 tablets (300 mg total) by mouth 3 (three) times daily.   levothyroxine 25 MCG tablet Commonly known as:  SYNTHROID, LEVOTHROID Take 1 tablet (  25 mcg total) by mouth daily before breakfast.   magnesium 30 MG tablet Take 30 mg by mouth daily.   meloxicam 15 MG tablet Commonly known as:  MOBIC Take 1 tablet (15 mg total) by mouth daily.   multivitamin with minerals Tabs  tablet Take 1 tablet by mouth daily.   ondansetron 4 MG tablet Commonly known as:  ZOFRAN TAKE 1 TABLET BY MOUTH EVERY 8 HOURS AS NEEDED FOR NAUSEA AND VOMITING   ondansetron 4 MG tablet Commonly known as:  ZOFRAN TAKE 1 TABLET BY MOUTH EVERY 8 HOURS AS NEEDED FOR NAUSEA AND VOMITING   oxyCODONE 5 MG immediate release tablet Commonly known as:  Oxy IR/ROXICODONE Take 1 tablet (5 mg total) by mouth every 4 (four) hours as needed for moderate pain or severe pain.   OYSTER SHELL CALCIUM PLUS D PO Take 1 tablet by mouth daily.   pantoprazole 40 MG tablet Commonly known as:  PROTONIX TAKE 1 TABLET BY MOUTH TWICE A DAY   rosuvastatin 10 MG tablet Commonly known as:  CRESTOR Take 1 tablet (10 mg total) by mouth daily.   Selenium 200 MCG Caps Take 200 mcg by mouth daily.   sucralfate 1 g tablet Commonly known as:  CARAFATE Take 1 g by mouth 2 (two) times daily.   vitamin C 1000 MG tablet Take 3,000 mg by mouth daily.   vitamin E 1000 UNIT capsule Take 1,000 Units by mouth 2 (two) times daily.   ZINC 15 PO Take 15 mg by mouth daily.        Follow-up Information    Leafy RoPabon, Diego F, MD. Go on 09/12/2018.   Specialty:  General Surgery Why:  1 week follow up for colostomy takedown with Dr Leonides GrillsPabon Contact information: [address] [place] [id number] [phone]           Signed: Lynden OxfordZachary Schulz , PA-C [facility]  09/06/2018, 1:58 PM [phone] M-F: 7am - 4pm

## 2018-09-07 ENCOUNTER — Telehealth: Payer: Self-pay | Admitting: *Deleted

## 2018-09-07 DIAGNOSIS — K439 Ventral hernia without obstruction or gangrene: Secondary | ICD-10-CM | POA: Diagnosis not present

## 2018-09-07 DIAGNOSIS — Z433 Encounter for attention to colostomy: Secondary | ICD-10-CM | POA: Diagnosis not present

## 2018-09-07 DIAGNOSIS — K435 Parastomal hernia without obstruction or  gangrene: Secondary | ICD-10-CM | POA: Diagnosis not present

## 2018-09-07 DIAGNOSIS — E039 Hypothyroidism, unspecified: Secondary | ICD-10-CM | POA: Diagnosis not present

## 2018-09-07 DIAGNOSIS — K5720 Diverticulitis of large intestine with perforation and abscess without bleeding: Secondary | ICD-10-CM | POA: Diagnosis not present

## 2018-09-07 DIAGNOSIS — Z48815 Encounter for surgical aftercare following surgery on the digestive system: Secondary | ICD-10-CM | POA: Diagnosis not present

## 2018-09-07 DIAGNOSIS — K219 Gastro-esophageal reflux disease without esophagitis: Secondary | ICD-10-CM | POA: Diagnosis not present

## 2018-09-07 DIAGNOSIS — N390 Urinary tract infection, site not specified: Secondary | ICD-10-CM | POA: Diagnosis not present

## 2018-09-07 DIAGNOSIS — D649 Anemia, unspecified: Secondary | ICD-10-CM | POA: Diagnosis not present

## 2018-09-07 DIAGNOSIS — K260 Acute duodenal ulcer with hemorrhage: Secondary | ICD-10-CM | POA: Diagnosis not present

## 2018-09-07 DIAGNOSIS — M810 Age-related osteoporosis without current pathological fracture: Secondary | ICD-10-CM | POA: Diagnosis not present

## 2018-09-07 DIAGNOSIS — J449 Chronic obstructive pulmonary disease, unspecified: Secondary | ICD-10-CM | POA: Diagnosis not present

## 2018-09-07 NOTE — Telephone Encounter (Signed)
[name] needs to orders to continue care and patient also had a order for physical therapy and she has declined that.

## 2018-09-07 NOTE — Telephone Encounter (Signed)
[facility] I verbal give the order.

## 2018-09-08 ENCOUNTER — Other Ambulatory Visit: Payer: Self-pay

## 2018-09-08 ENCOUNTER — Emergency Department: Payer: Medicare HMO

## 2018-09-08 ENCOUNTER — Emergency Department
Admission: EM | Admit: 2018-09-08 | Discharge: 2018-09-08 | Disposition: A | Discharge status: Home / Self Care | Payer: Medicare HMO | Attending: Emergency Medicine | Admitting: Emergency Medicine

## 2018-09-08 DIAGNOSIS — R42 Dizziness and giddiness: Secondary | ICD-10-CM

## 2018-09-08 DIAGNOSIS — Z87891 Personal history of nicotine dependence: Secondary | ICD-10-CM | POA: Diagnosis not present

## 2018-09-08 DIAGNOSIS — R000 Tachycardia, unspecified: Secondary | ICD-10-CM | POA: Diagnosis not present

## 2018-09-08 DIAGNOSIS — S0101XA Laceration without foreign body of scalp, initial encounter: Secondary | ICD-10-CM | POA: Diagnosis not present

## 2018-09-08 DIAGNOSIS — W0110XA Fall on same level from slipping, tripping and stumbling with subsequent striking against unspecified object, initial encounter: Secondary | ICD-10-CM | POA: Diagnosis not present

## 2018-09-08 DIAGNOSIS — E039 Hypothyroidism, unspecified: Secondary | ICD-10-CM | POA: Diagnosis not present

## 2018-09-08 DIAGNOSIS — Y9301 Activity, walking, marching and hiking: Secondary | ICD-10-CM | POA: Diagnosis not present

## 2018-09-08 DIAGNOSIS — Y92009 Unspecified place in unspecified non-institutional (private) residence as the place of occurrence of the external cause: Secondary | ICD-10-CM | POA: Diagnosis not present

## 2018-09-08 DIAGNOSIS — I959 Hypotension, unspecified: Secondary | ICD-10-CM | POA: Diagnosis not present

## 2018-09-08 DIAGNOSIS — R52 Pain, unspecified: Secondary | ICD-10-CM | POA: Diagnosis not present

## 2018-09-08 DIAGNOSIS — Y999 Unspecified external cause status: Secondary | ICD-10-CM | POA: Diagnosis not present

## 2018-09-08 DIAGNOSIS — S0990XA Unspecified injury of head, initial encounter: Secondary | ICD-10-CM | POA: Diagnosis not present

## 2018-09-08 DIAGNOSIS — S300XXA Contusion of lower back and pelvis, initial encounter: Secondary | ICD-10-CM | POA: Diagnosis not present

## 2018-09-08 DIAGNOSIS — W19XXXA Unspecified fall, initial encounter: Secondary | ICD-10-CM

## 2018-09-08 LAB — COMPREHENSIVE METABOLIC PANEL
ALK PHOS: 83 U/L (ref 38–126)
ALT: 15 U/L (ref 0–44)
AST: 22 U/L (ref 15–41)
Albumin: 3.1 g/dL — ABNORMAL LOW (ref 3.5–5.0)
Anion gap: 9 (ref 5–15)
BUN: 13 mg/dL (ref 8–23)
CHLORIDE: 101 mmol/L (ref 98–111)
CO2: 28 mmol/L (ref 22–32)
Calcium: 9.4 mg/dL (ref 8.9–10.3)
Creatinine, Ser: 1.03 mg/dL — ABNORMAL HIGH (ref 0.44–1.00)
GFR calc Af Amer: >60 mL/min (ref >60)
GFR calc non Af Amer: 56 mL/min — ABNORMAL LOW (ref >60)
Glucose, Bld: 126 mg/dL — ABNORMAL HIGH (ref 70–99)
Potassium: 3.5 mmol/L (ref 3.5–5.1)
Sodium: 138 mmol/L (ref 135–145)
Total Bilirubin: 0.3 mg/dL (ref 0.3–1.2)
Total Protein: 7.1 g/dL (ref 6.5–8.1)

## 2018-09-08 LAB — CBC WITH DIFFERENTIAL/PLATELET
Abs Immature Granulocytes: 0.17 10*3/uL — ABNORMAL HIGH (ref 0.00–0.07)
Basophils Absolute: 0 10*3/uL (ref 0.0–0.1)
Basophils Relative: 0 %
Eosinophils Absolute: 0.5 10*3/uL (ref 0.0–0.5)
Eosinophils Relative: 7 %
HCT: 30.5 % — ABNORMAL LOW (ref 36.0–46.0)
Hemoglobin: 9.6 g/dL — ABNORMAL LOW (ref 12.0–15.0)
Immature Granulocytes: 2 %
Lymphocytes Relative: 30 %
Lymphs Abs: 2.1 10*3/uL (ref 0.7–4.0)
MCH: 34 pg (ref 26.0–34.0)
MCHC: 31.5 g/dL (ref 30.0–36.0)
MCV: 108.2 fL — ABNORMAL HIGH (ref 80.0–100.0)
MONO ABS: 0.5 10*3/uL (ref 0.1–1.0)
Monocytes Relative: 6 %
Neutro Abs: 3.9 10*3/uL (ref 1.7–7.7)
Neutrophils Relative %: 55 %
Platelets: 689 10*3/uL — ABNORMAL HIGH (ref 150–400)
RBC: 2.82 MIL/uL — ABNORMAL LOW (ref 3.87–5.11)
RDW: 11.5 % (ref 11.5–15.5)
WBC: 7.2 10*3/uL (ref 4.0–10.5)
nRBC: 0 % (ref 0.0–0.2)

## 2018-09-08 LAB — TROPONIN I: Troponin I: <0.03 ng/mL (ref <0.03)

## 2018-09-08 MED ORDER — LIDOCAINE-EPINEPHRINE 2 %-1:100000 IJ SOLN
INTRAMUSCULAR | Status: AC
  Administered 2018-09-08: 16:00:00
  Filled 2018-09-08: qty 1

## 2018-09-08 NOTE — ED Notes (Signed)
Patient verbalized understanding of discharge instructions, no questions. Patient ambulated out of ED with steady gait in no distress.  

## 2018-09-08 NOTE — ED Provider Notes (Signed)
[facility] Emergency Department Provider Note       Time seen: ----------------------------------------- 4:06 PM on 09/08/2018 -----------------------------------------   I have reviewed the triage vital signs and the nursing notes.  HISTORY   Chief Complaint Fall; Dizziness; and Head Laceration    HPI Annette Miller is a 67 y.o. female with a history of anemia, anxiety, arthritis, depression, GERD, cardiac arrest, hyperlipidemia, hypothyroidism who presents to the ED for a fall.  Patient reports starting gabapentin a few days ago.  Since she started that she has been dizzy.  She had a fall at home.  She complains of pain to the sacrum and sustained a laceration to the posterior scalp.  Past Medical History:  Diagnosis Date  . Allergy   . Anemia    improved since surgery in May 2019  . Anxiety   . Arthritis   . Depression   . GERD (gastroesophageal reflux disease) 2019   PUD, history of diverticulitis  . Goiter   . H/O cardiac arrest 03/2018   s/p surgery and while in hospital. no cardiac history  . Headache   . History of blood transfusion 01/2018  . Hyperlipidemia   . Hypothyroidism   . Osteoporosis     Patient Active Problem List   Diagnosis Date Noted  . Malnutrition of moderate degree 09/01/2018  . S/P colostomy takedown 08/30/2018  . History of bowel diversion surgery   . Hematemesis   . Symptomatic anemia 04/06/2018  . Protein-calorie malnutrition, severe 02/27/2018  . Diverticulitis of colon with perforation 02/25/2018  . Intestinal perforation (HCC)   . PUD (peptic ulcer disease) 12/15/2017  . Kidney stone 12/08/2017  . Anxiety 05/20/2017  . Allergy 05/20/2017  . Trapezius muscle spasm 01/27/2017  . H/O total hysterectomy 05/12/2016  . Hyperlipidemia 05/08/2015  . Depression 05/08/2015  . Hypothyroidism 05/08/2015    Past Surgical History:  Procedure Laterality Date  . ABDOMINAL HYSTERECTOMY  1979  . APPENDECTOMY   01/2018  . COLONOSCOPY WITH PROPOFOL N/A 01/14/2018   Procedure: COLONOSCOPY WITH PROPOFOL;  Surgeon: Toney Reil, MD;  Location: [facility] ENDOSCOPY;  Service: Gastroenterology;  Laterality: N/A;  . COLONOSCOPY WITH PROPOFOL N/A 06/24/2018   Procedure: COLONOSCOPY WITH PROPOFOL;  Surgeon: [name], MD;  Location: [facility] ENDOSCOPY;  Service: Gastroenterology;  Laterality: N/A;  . COLOSTOMY TAKEDOWN N/A 08/30/2018   Procedure: COLOSTOMY TAKEDOWN;  Surgeon: Leafy Ro, MD;  Location: [facility] ORS;  Service: General;  Laterality: N/A;  . ESOPHAGOGASTRODUODENOSCOPY N/A 04/06/2018   Procedure: ESOPHAGOGASTRODUODENOSCOPY (EGD);  Surgeon: Toney Reil, MD;  Location: [facility] ENDOSCOPY;  Service: Gastroenterology;  Laterality: N/A;  . ESOPHAGOGASTRODUODENOSCOPY (EGD) WITH PROPOFOL N/A 01/14/2018   Procedure: ESOPHAGOGASTRODUODENOSCOPY (EGD) WITH PROPOFOL;  Surgeon: Toney Reil, MD;  Location: [facility] ENDOSCOPY;  Service: Gastroenterology;  Laterality: N/A;  . ESOPHAGOGASTRODUODENOSCOPY (EGD) WITH PROPOFOL N/A 06/24/2018   Procedure: ESOPHAGOGASTRODUODENOSCOPY (EGD) WITH PROPOFOL;  Surgeon: [name], MD;  Location: [facility] ENDOSCOPY;  Service: Gastroenterology;  Laterality: N/A;  . LAPAROTOMY N/A 02/25/2018   Procedure: EXPLORATORY LAPAROTOMY, PARTMAN'S PROCEDURE, APPENDECTOMY, COLOSTOMY;  Surgeon: Star Age, DO;  Location: [facility] ORS;  Service: General;  Laterality: N/A;  . TONSILLECTOMY  1976  . VENTRAL HERNIA REPAIR N/A 08/30/2018   Procedure: HERNIA REPAIR VENTRAL ADULT;  Surgeon: Leafy Ro, MD;  Location: [facility] ORS;  Service: General;  Laterality: N/A;  . VISCERAL ANGIOGRAPHY N/A 04/07/2018   Procedure: VISCERAL ANGIOGRAPHY;  Surgeon: Annice Needy, MD;  Location: [facility] INVASIVE CV LAB;  Service: Cardiovascular;  Laterality: N/A;    Allergies Aspirin; Prednisone; Codeine; and Tape  Social History Social History   Tobacco Use  . Smoking status: Former Smoker    Packs/day: 0.10     Types: Cigarettes    Last attempt to quit: 07/20/2018    Years since quitting: 0.1  . Smokeless tobacco: Never Used  . Tobacco comment: 5 per day.   Substance Use Topics  . Alcohol use: No  . Drug use: No   Review of Systems Constitutional: Negative for fever. Cardiovascular: Negative for chest pain. Respiratory: Negative for shortness of breath. Gastrointestinal: Negative for abdominal pain, vomiting and diarrhea. Musculoskeletal: Positive for sacral pain Skin: Positive for posterior scalp laceration Neurological: Positive for headache  All systems negative/normal/unremarkable except as stated in the HPI  ____________________________________________   PHYSICAL EXAM:  VITAL SIGNS: ED Triage Vitals  Enc Vitals Group     BP 09/08/18 1551 126/64     Pulse Rate 09/08/18 1551 (!) 114     Resp 09/08/18 1551 16     Temp 09/08/18 1551 97.6 F (36.4 C)     Temp Source 09/08/18 1551 Oral     SpO2 09/08/18 1551 96 %     Weight --      Height --      Head Circumference --      Peak Flow --      Pain Score 09/08/18 1555 5     Pain Loc --      Pain Edu? --      Excl. in GC? --    Constitutional: Alert and oriented.  Chronically ill-appearing, no distress ENT   Head: Normocephalic, 6 cm midline posterior scalp laceration   Nose: No congestion/rhinnorhea.   Mouth/Throat: Mucous membranes are moist.   Neck: No stridor. Cardiovascular: Normal rate, regular rhythm. No murmurs, rubs, or gallops. Respiratory: Normal respiratory effort without tachypnea nor retractions. Breath sounds are clear and equal bilaterally. No wheezes/rales/rhonchi. Gastrointestinal: Soft and nontender. Normal bowel sounds Musculoskeletal: Nontender with normal range of motion in extremities.  Tenderness over the sacrum is noted Neurologic:  Normal speech and language. No gross focal neurologic deficits are appreciated.  Skin: Sick centimeter scalp laceration as noted above Psychiatric: Mood and  affect are normal. Speech and behavior are normal.  ____________________________________________  EKG: Interpreted by me.  Sinus tachycardia with a rate of 115 bpm, normal PR interval, normal QRS, normal QT  ____________________________________________  ED COURSE:  As part of my medical decision making, I reviewed the following data within the electronic MEDICAL RECORD NUMBER History obtained from family if available, nursing notes, old chart and ekg, as well as notes from prior ED visits. Patient presented for a fall, we will assess with labs and imaging as indicated at this time.   Marland Kitchen.Laceration Repair Date/Time: 09/08/2018 4:09 PM Performed by: Emily Filbert, MD Authorized by: Emily Filbert, MD   Consent:    Consent obtained:  Verbal   Consent given by:  Patient   Risks discussed:  Infection, pain, retained foreign body, poor cosmetic result and poor wound healing Anesthesia (see MAR for exact dosages):    Anesthesia method:  Local infiltration   Local anesthetic:  Lidocaine 1% WITH epi Laceration details:    Location:  Scalp   Scalp location:  Occipital   Length (cm):  6   Depth (mm):  10 Repair type:    Repair type:  Intermediate Exploration:    Hemostasis achieved with:  Direct pressure  Wound exploration: entire depth of wound probed and visualized     Contaminated: no   Treatment:    Area cleansed with:  Saline   Amount of cleaning:  Extensive   Irrigation solution:  Sterile saline   Visualized foreign bodies/material removed: no   Fascia repair:    Suture size:  4-0   Suture material:  Vicryl   Suture technique:  Simple interrupted   Number of sutures:  2 Skin repair:    Repair method:  Sutures and staples   Number of staples:  7 Approximation:    Approximation:  Close Post-procedure details:    Dressing:  Sterile dressing   Patient tolerance of procedure:  Tolerated well, no immediate  complications   ____________________________________________   LABS (pertinent positives/negatives)  Labs Reviewed  CBC WITH DIFFERENTIAL/PLATELET - Abnormal; Notable for the following components:      Result Value   RBC 2.82 (*)    Hemoglobin 9.6 (*)    HCT 30.5 (*)    MCV 108.2 (*)    Platelets 689 (*)    Abs Immature Granulocytes 0.17 (*)    All other components within normal limits  COMPREHENSIVE METABOLIC PANEL - Abnormal; Notable for the following components:   Glucose, Bld 126 (*)    Creatinine, Ser 1.03 (*)    Albumin 3.1 (*)    GFR calc non Af Amer 56 (*)    All other components within normal limits  TROPONIN I  URINALYSIS, COMPLETE (UACMP) WITH MICROSCOPIC    RADIOLOGY Images were viewed by me  CT head, sacral x-ray IMPRESSION: No intracranial injury. No skull fracture. Chronic small-vessel ischemic changes as outlined above. IMPRESSION: No acute osseous abnormality. Mild osteoarthritis involving the LEFT hip. IMPRESSION: No acute osseous abnormality.  ____________________________________________  DIFFERENTIAL DIAGNOSIS   Contusion, fracture, laceration, subdural hematoma, subarachnoid hemorrhage, dehydration, electrolyte abnormality  FINAL ASSESSMENT AND PLAN  Fall, head injury, scalp laceration   Plan: The patient had presented for fall that occurred at home. Patient's labs are stable, do not reveal any acute process. Patient's imaging was negative.  Wound was repaired as dictated above.  She is stable for outpatient follow-up.  She has been advised to hold all medications that could make her drowsy or dizzy.   Ulice DashJohnathan E Williams, MD   Note: This note was generated in part or whole with voice recognition software. Voice recognition is usually quite accurate but there are transcription errors that can and very often do occur. I apologize for any typographical errors that were not detected and corrected.     Emily FilbertWilliams, Jonathan E,  MD 09/08/18 [phone]

## 2018-09-08 NOTE — ED Triage Notes (Signed)
Patient started gabapentin few days ago. Since started, patient has had dizziness since. Hematoma to sacral area, laceration to back of head, bleeding controlled. Denies blood thinners   BP: 118/59 HR:110 BGL: 186

## 2018-09-08 NOTE — ED Notes (Signed)
Patient with unsteady gait to bathroom. +Dizziness. Ambulated back to bed with assistance from this RN.

## 2018-09-08 NOTE — ED Notes (Signed)
Patient transported to CT 

## 2018-09-12 ENCOUNTER — Other Ambulatory Visit: Payer: Self-pay

## 2018-09-12 ENCOUNTER — Encounter: Payer: Self-pay | Admitting: Surgery

## 2018-09-12 ENCOUNTER — Ambulatory Visit (INDEPENDENT_AMBULATORY_CARE_PROVIDER_SITE_OTHER): Payer: Medicare HMO | Admitting: Surgery

## 2018-09-12 VITALS — BP 128/70 | HR 74 | Temp 98.0°F | Ht 62.0 in | Wt 101.0 lb

## 2018-09-12 DIAGNOSIS — Z09 Encounter for follow-up examination after completed treatment for conditions other than malignant neoplasm: Secondary | ICD-10-CM

## 2018-09-12 NOTE — Progress Notes (Signed)
S/p colostomy takedown and abd wall reconstruction 12/3 Doing well Had a recent fall requiring staples 4 days ago Hb going up Taking PO, no fevers or chills, no pain normal Bms About 30-40cc from drain daily  PE NAD Abd: soft, staples removed. No infection. JP removed. No peritonitis  A/p Doing well No complications RTC 3 weeks

## 2018-09-12 NOTE — Patient Instructions (Signed)
Return two weeks.

## 2018-09-14 DIAGNOSIS — K219 Gastro-esophageal reflux disease without esophagitis: Secondary | ICD-10-CM | POA: Diagnosis not present

## 2018-09-14 DIAGNOSIS — Z48815 Encounter for surgical aftercare following surgery on the digestive system: Secondary | ICD-10-CM | POA: Diagnosis not present

## 2018-09-14 DIAGNOSIS — K439 Ventral hernia without obstruction or gangrene: Secondary | ICD-10-CM | POA: Diagnosis not present

## 2018-09-14 DIAGNOSIS — K5720 Diverticulitis of large intestine with perforation and abscess without bleeding: Secondary | ICD-10-CM | POA: Diagnosis not present

## 2018-09-14 DIAGNOSIS — K260 Acute duodenal ulcer with hemorrhage: Secondary | ICD-10-CM | POA: Diagnosis not present

## 2018-09-14 DIAGNOSIS — J449 Chronic obstructive pulmonary disease, unspecified: Secondary | ICD-10-CM | POA: Diagnosis not present

## 2018-09-14 DIAGNOSIS — M810 Age-related osteoporosis without current pathological fracture: Secondary | ICD-10-CM | POA: Diagnosis not present

## 2018-09-14 DIAGNOSIS — E039 Hypothyroidism, unspecified: Secondary | ICD-10-CM | POA: Diagnosis not present

## 2018-09-14 DIAGNOSIS — K435 Parastomal hernia without obstruction or  gangrene: Secondary | ICD-10-CM | POA: Diagnosis not present

## 2018-09-14 DIAGNOSIS — Z433 Encounter for attention to colostomy: Secondary | ICD-10-CM | POA: Diagnosis not present

## 2018-09-15 ENCOUNTER — Telehealth: Payer: Self-pay | Admitting: *Deleted

## 2018-09-15 NOTE — Telephone Encounter (Signed)
Annette Miller  from home health called and wanted a order for Rinaldo Cloudamela to have home health care remove her staples from the back on her head. So she did not have to go back to the hospital. I given them a verbal order to remove them at that visit.

## 2018-09-19 ENCOUNTER — Other Ambulatory Visit: Payer: Self-pay

## 2018-09-20 DIAGNOSIS — K5720 Diverticulitis of large intestine with perforation and abscess without bleeding: Secondary | ICD-10-CM | POA: Diagnosis not present

## 2018-09-20 DIAGNOSIS — K260 Acute duodenal ulcer with hemorrhage: Secondary | ICD-10-CM | POA: Diagnosis not present

## 2018-09-20 DIAGNOSIS — M810 Age-related osteoporosis without current pathological fracture: Secondary | ICD-10-CM | POA: Diagnosis not present

## 2018-09-20 DIAGNOSIS — Z48815 Encounter for surgical aftercare following surgery on the digestive system: Secondary | ICD-10-CM | POA: Diagnosis not present

## 2018-09-20 DIAGNOSIS — E039 Hypothyroidism, unspecified: Secondary | ICD-10-CM | POA: Diagnosis not present

## 2018-09-20 DIAGNOSIS — K435 Parastomal hernia without obstruction or  gangrene: Secondary | ICD-10-CM | POA: Diagnosis not present

## 2018-09-20 DIAGNOSIS — K439 Ventral hernia without obstruction or gangrene: Secondary | ICD-10-CM | POA: Diagnosis not present

## 2018-09-20 DIAGNOSIS — Z433 Encounter for attention to colostomy: Secondary | ICD-10-CM | POA: Diagnosis not present

## 2018-09-20 DIAGNOSIS — J449 Chronic obstructive pulmonary disease, unspecified: Secondary | ICD-10-CM | POA: Diagnosis not present

## 2018-09-20 DIAGNOSIS — K219 Gastro-esophageal reflux disease without esophagitis: Secondary | ICD-10-CM | POA: Diagnosis not present

## 2018-09-29 ENCOUNTER — Telehealth: Payer: Self-pay | Admitting: *Deleted

## 2018-09-29 DIAGNOSIS — E039 Hypothyroidism, unspecified: Secondary | ICD-10-CM | POA: Diagnosis not present

## 2018-09-29 DIAGNOSIS — K260 Acute duodenal ulcer with hemorrhage: Secondary | ICD-10-CM | POA: Diagnosis not present

## 2018-09-29 DIAGNOSIS — K439 Ventral hernia without obstruction or gangrene: Secondary | ICD-10-CM | POA: Diagnosis not present

## 2018-09-29 DIAGNOSIS — M810 Age-related osteoporosis without current pathological fracture: Secondary | ICD-10-CM | POA: Diagnosis not present

## 2018-09-29 DIAGNOSIS — K435 Parastomal hernia without obstruction or  gangrene: Secondary | ICD-10-CM | POA: Diagnosis not present

## 2018-09-29 DIAGNOSIS — J449 Chronic obstructive pulmonary disease, unspecified: Secondary | ICD-10-CM | POA: Diagnosis not present

## 2018-09-29 DIAGNOSIS — K219 Gastro-esophageal reflux disease without esophagitis: Secondary | ICD-10-CM | POA: Diagnosis not present

## 2018-09-29 DIAGNOSIS — Z48815 Encounter for surgical aftercare following surgery on the digestive system: Secondary | ICD-10-CM | POA: Diagnosis not present

## 2018-09-29 DIAGNOSIS — Z433 Encounter for attention to colostomy: Secondary | ICD-10-CM | POA: Diagnosis not present

## 2018-09-29 DIAGNOSIS — K5720 Diverticulitis of large intestine with perforation and abscess without bleeding: Secondary | ICD-10-CM | POA: Diagnosis not present

## 2018-09-29 NOTE — Telephone Encounter (Signed)
Patient cancelled follow up appointment that was scheduled for 10-03-18 with Dr. Everlene Farrier.   Per appointment notes, patient may call and cancel if she did not feel the appointment was necessary-ok per Dr. Everlene Farrier.   Patient is requesting a refill on her gabapentin. She states she did contact the pharmacy about this request but I don't see that a refill request has been received.   The patient [facility].   Message routed to [facility] Clinical Pool.

## 2018-09-29 NOTE — Telephone Encounter (Signed)
I called patient to ask if she was still taking her gabapentin because we received a refill request, she does still take this medication. She had cancelled her appointment because she was doing better (as told by Dr Everlene Farrier) but she does fill this medication helps the "pulling" sensation she has. Can we refill this?

## 2018-09-29 NOTE — Telephone Encounter (Signed)
Message already in system for Dr Everlene FarrierPabon, pt aware he will be here Monday (has enough pills left).

## 2018-09-30 NOTE — Telephone Encounter (Signed)
Yes we can give her the last refill

## 2018-10-03 ENCOUNTER — Ambulatory Visit: Payer: Medicare HMO | Admitting: Family Medicine

## 2018-10-03 ENCOUNTER — Ambulatory Visit: Payer: Medicare HMO | Admitting: Surgery

## 2018-10-03 ENCOUNTER — Other Ambulatory Visit: Payer: Self-pay | Admitting: *Deleted

## 2018-10-03 MED ORDER — GABAPENTIN 600 MG PO TABS: 600.0000 mg | ORAL_TABLET | Freq: Three times a day (TID) | ORAL | 1 refills | Status: DC

## 2018-10-03 NOTE — Telephone Encounter (Signed)
Called patient and sent in Gabapentin .

## 2018-10-05 ENCOUNTER — Telehealth: Payer: Self-pay | Admitting: Gastroenterology

## 2018-10-05 NOTE — Telephone Encounter (Signed)
PT  Needs refill on rx Succrasate 1 g  Twice a day send to [facility] [place]

## 2018-10-06 ENCOUNTER — Ambulatory Visit: Payer: Medicare HMO | Admitting: Gastroenterology

## 2018-10-06 NOTE — Telephone Encounter (Signed)
Called pt to inform her of Dr. Johnney Killian suggestions. Unable to contact. No Vm set up.

## 2018-10-06 NOTE — Telephone Encounter (Signed)
Annette Miller - I do not think she needs sucralfate- if she wishes to take it as needed for pain and it has been helping am happy to prescribe

## 2018-10-10 ENCOUNTER — Ambulatory Visit (INDEPENDENT_AMBULATORY_CARE_PROVIDER_SITE_OTHER): Payer: Medicare HMO | Admitting: Family Medicine

## 2018-10-10 ENCOUNTER — Encounter: Payer: Self-pay | Admitting: Family Medicine

## 2018-10-10 DIAGNOSIS — F3342 Major depressive disorder, recurrent, in full remission: Secondary | ICD-10-CM | POA: Diagnosis not present

## 2018-10-10 DIAGNOSIS — D649 Anemia, unspecified: Secondary | ICD-10-CM | POA: Diagnosis not present

## 2018-10-10 DIAGNOSIS — E440 Moderate protein-calorie malnutrition: Secondary | ICD-10-CM

## 2018-10-10 DIAGNOSIS — E039 Hypothyroidism, unspecified: Secondary | ICD-10-CM

## 2018-10-10 DIAGNOSIS — R69 Illness, unspecified: Secondary | ICD-10-CM | POA: Diagnosis not present

## 2018-10-10 MED ORDER — TRIAMCINOLONE ACETONIDE 0.1 % EX CREA: 1.0000 "application " | TOPICAL_CREAM | Freq: Two times a day (BID) | CUTANEOUS | 0 refills | Status: AC

## 2018-10-10 MED ORDER — GABAPENTIN 300 MG PO CAPS: 300.0000 mg | ORAL_CAPSULE | Freq: Three times a day (TID) | ORAL | 3 refills | Status: DC

## 2018-10-10 MED ORDER — RANITIDINE HCL 150 MG PO TABS: 150.0000 mg | ORAL_TABLET | Freq: Two times a day (BID) | ORAL | 2 refills | Status: DC

## 2018-10-10 NOTE — Assessment & Plan Note (Signed)
Discuss hypothyroid we will check TSH

## 2018-10-10 NOTE — Assessment & Plan Note (Signed)
Discussed malnutrition hair loss will check blood work

## 2018-10-10 NOTE — Assessment & Plan Note (Signed)
Check CBC 

## 2018-10-10 NOTE — Progress Notes (Signed)
BP 134/80   Pulse 78   Temp 98 F (36.7 C) (Oral)   Ht 5\' 2"  (1.575 m)   Wt 95 lb 6.4 oz (43.3 kg)   SpO2 97%   BMI 17.45 kg/m    Subjective:    Patient ID: Annette Miller, female    DOB: Nov 21, 1950, 68 y.o.   MRN: [id number]  HPI: Annette Miller is a 68 y.o. female  Chief Complaint  Patient presents with  . Follow-up  . Hairloss   Patient follow-up multiple medical problems.  Patient with multiple GI surgeries for diverticulitis and GI bleed now having marked GI pain discomfort more epigastric has been taking Tums which seems to help.  Not taking any further narcotics. Patient has appointment with GI in February. Encourage patient to move appointment up because of her history. Reviewed chart patient's last CBC had a hemoglobin of 9.  Will check again. CMP with low protein and calcium.  Will check that again. Reviewed nutrition and with patient's abdominal pain and discomfort with swallowing patient has lost 6 pounds since December. On chart review patient had not had her thyroid checked for some time and has hair loss.  Hair loss most likely secondary to nutrition status but has not had thyroid checked for some time so will check that also.  All by the way patient also has bad poison ivy did some weed pulling 4 days ago and now has poison ivy on her arms chest and legs back also.  Has tried Benadryl but still is itching like crazy.  Relevant past medical, surgical, family and social history reviewed and updated as indicated. Interim medical history since our last visit reviewed. Allergies and medications reviewed and updated.  Review of Systems  Constitutional: Negative.   Respiratory: Negative.   Cardiovascular: Negative.     Per HPI unless specifically indicated above     Objective:    BP 134/80   Pulse 78   Temp 98 F (36.7 C) (Oral)   Ht 5\' 2"  (1.575 m)   Wt 95 lb 6.4 oz (43.3 kg)   SpO2 97%   BMI 17.45 kg/m   Wt Readings from Last 3 Encounters:    10/10/18 95 lb 6.4 oz (43.3 kg)  09/12/18 101 lb (45.8 kg)  08/30/18 100 lb (45.4 kg)    Physical Exam Constitutional:      Appearance: She is well-developed.  HENT:     Head: Normocephalic and atraumatic.  Eyes:     Conjunctiva/sclera: Conjunctivae normal.  Neck:     Musculoskeletal: Normal range of motion.  Cardiovascular:     Rate and Rhythm: Normal rate and regular rhythm.     Heart sounds: Normal heart sounds.  Pulmonary:     Effort: Pulmonary effort is normal.     Breath sounds: Normal breath sounds.  Musculoskeletal: Normal range of motion.  Skin:    Findings: No erythema.     Comments: Poison ivy changes and thinning hair  Neurological:     Mental Status: She is alert and oriented to person, place, and time.  Psychiatric:        Behavior: Behavior normal.        Thought Content: Thought content normal.        Judgment: Judgment normal.     Results for orders placed or performed during the hospital encounter of 09/08/18  CBC with Differential/Platelet  Result Value Ref Range   WBC 7.2 4.0 - 10.5 K/uL   RBC 2.82 (  L) 3.87 - 5.11 MIL/uL   Hemoglobin 9.6 (L) 12.0 - 15.0 g/dL   HCT 49.7 (L) 53.0 - 05.1 %   MCV 108.2 (H) 80.0 - 100.0 fL   MCH 34.0 26.0 - 34.0 pg   MCHC 31.5 30.0 - 36.0 g/dL   RDW 10.2 11.1 - 73.5 %   Platelets 689 (H) 150 - 400 K/uL   nRBC 0.0 0.0 - 0.2 %   Neutrophils Relative % 55 %   Neutro Abs 3.9 1.7 - 7.7 K/uL   Lymphocytes Relative 30 %   Lymphs Abs 2.1 0.7 - 4.0 K/uL   Monocytes Relative 6 %   Monocytes Absolute 0.5 0.1 - 1.0 K/uL   Eosinophils Relative 7 %   Eosinophils Absolute 0.5 0.0 - 0.5 K/uL   Basophils Relative 0 %   Basophils Absolute 0.0 0.0 - 0.1 K/uL   Immature Granulocytes 2 %   Abs Immature Granulocytes 0.17 (H) 0.00 - 0.07 K/uL  Comprehensive metabolic panel  Result Value Ref Range   Sodium 138 135 - 145 mmol/L   Potassium 3.5 3.5 - 5.1 mmol/L   Chloride 101 98 - 111 mmol/L   CO2 28 22 - 32 mmol/L   Glucose, Bld  126 (H) 70 - 99 mg/dL   BUN 13 8 - 23 mg/dL   Creatinine, Ser 6.70 (H) 0.44 - 1.00 mg/dL   Calcium 9.4 8.9 - 14.1 mg/dL   Total Protein 7.1 6.5 - 8.1 g/dL   Albumin 3.1 (L) 3.5 - 5.0 g/dL   AST 22 15 - 41 U/L   ALT 15 0 - 44 U/L   Alkaline Phosphatase 83 38 - 126 U/L   Total Bilirubin 0.3 0.3 - 1.2 mg/dL   GFR calc non Af Amer 56 (L) >60 mL/min   GFR calc Af Amer >60 >60 mL/min   Anion gap 9 5 - 15  Troponin I - ONCE - STAT  Result Value Ref Range   Troponin I <0.03 <0.03 ng/mL      Assessment & Plan:   Problem List Items Addressed This Visit      Endocrine   Hypothyroidism    Discuss hypothyroid we will check TSH      Relevant Orders   TSH     Other   Depression    Still active but stable will leave alone for now      Relevant Orders   Comprehensive metabolic panel   CBC with Differential/Platelet   Symptomatic anemia    Check CBC      Relevant Orders   Comprehensive metabolic panel   CBC with Differential/Platelet   Malnutrition of moderate degree    Discussed malnutrition hair loss will check blood work      Relevant Orders   Comprehensive metabolic panel   CBC with Differential/Platelet       Follow up plan: Return in about 3 months (around 01/09/2019), or if symptoms worsen or fail to improve, for recheck nutrition.

## 2018-10-10 NOTE — Addendum Note (Signed)
Addended by: Vonita Moss A on: 10/10/2018 02:40 PM   Modules accepted: Orders

## 2018-10-10 NOTE — Assessment & Plan Note (Signed)
Still active but stable will leave alone for now

## 2018-10-11 ENCOUNTER — Telehealth: Payer: Self-pay | Admitting: Family Medicine

## 2018-10-11 DIAGNOSIS — E039 Hypothyroidism, unspecified: Secondary | ICD-10-CM

## 2018-10-11 DIAGNOSIS — D649 Anemia, unspecified: Secondary | ICD-10-CM

## 2018-10-11 LAB — CBC WITH DIFFERENTIAL/PLATELET
Basophils Absolute: 0.1 10*3/uL (ref 0.0–0.2)
Basos: 1 %
EOS (ABSOLUTE): 0.3 10*3/uL (ref 0.0–0.4)
EOS: 3 %
Hematocrit: 28.1 % — ABNORMAL LOW (ref 34.0–46.6)
Hemoglobin: 9.4 g/dL — ABNORMAL LOW (ref 11.1–15.9)
Immature Grans (Abs): 0 10*3/uL (ref 0.0–0.1)
Immature Granulocytes: 0 %
Lymphocytes Absolute: 2.2 10*3/uL (ref 0.7–3.1)
Lymphs: 25 %
MCH: 33.2 pg — AB (ref 26.6–33.0)
MCHC: 33.5 g/dL (ref 31.5–35.7)
MCV: 99 fL — ABNORMAL HIGH (ref 79–97)
MONOS ABS: 0.5 10*3/uL (ref 0.1–0.9)
Monocytes: 6 %
Neutrophils Absolute: 5.6 10*3/uL (ref 1.4–7.0)
Neutrophils: 65 %
Platelets: 654 10*3/uL — ABNORMAL HIGH (ref 150–450)
RBC: 2.83 x10E6/uL — ABNORMAL LOW (ref 3.77–5.28)
RDW: 11.8 % (ref 11.7–15.4)
WBC: 8.6 10*3/uL (ref 3.4–10.8)

## 2018-10-11 LAB — COMPREHENSIVE METABOLIC PANEL
ALK PHOS: 111 IU/L (ref 39–117)
ALT: 8 IU/L (ref 0–32)
AST: 9 IU/L (ref 0–40)
Albumin/Globulin Ratio: 1 — ABNORMAL LOW (ref 1.2–2.2)
Albumin: 3.7 g/dL (ref 3.6–4.8)
BUN/Creatinine Ratio: 21 (ref 12–28)
BUN: 18 mg/dL (ref 8–27)
Bilirubin Total: <0.2 mg/dL (ref 0.0–1.2)
CHLORIDE: 99 mmol/L (ref 96–106)
CO2: 25 mmol/L (ref 20–29)
Calcium: 9.8 mg/dL (ref 8.7–10.3)
Creatinine, Ser: 0.87 mg/dL (ref 0.57–1.00)
GFR calc Af Amer: 80 mL/min/{1.73_m2} (ref >59)
GFR calc non Af Amer: 69 mL/min/{1.73_m2} (ref >59)
Globulin, Total: 3.7 g/dL (ref 1.5–4.5)
Glucose: 83 mg/dL (ref 65–99)
POTASSIUM: 4 mmol/L (ref 3.5–5.2)
Sodium: 142 mmol/L (ref 134–144)
Total Protein: 7.4 g/dL (ref 6.0–8.5)

## 2018-10-11 LAB — TSH: TSH: 0.367 u[IU]/mL — ABNORMAL LOW (ref 0.450–4.500)

## 2018-10-11 NOTE — Telephone Encounter (Signed)
-----   Message from Pablo Ledger, CMA sent at 10/11/2018 11:50 AM EST ----- Phone call.

## 2018-10-11 NOTE — Telephone Encounter (Signed)
Phone call Discussed with patient hemoglobin still low patient not taking iron discussed taking iron tablets. Discussed TA's H suppressed will take thyroid 25 mcg every other day. Recheck TSH CBC 2 months.

## 2018-10-13 NOTE — Telephone Encounter (Signed)
Called pt to inform her of Dr. Johnney Killian suggestions.  Unble to contact. I was able to LVM to return call

## 2018-10-13 NOTE — Telephone Encounter (Signed)
Spoke with pt regarding her request for a refill on sucralfate. I informed her of Dr. Johnney Killian suggestions. Pt agrees with Dr. Tobi Bastos and has decided to discontinue the sucralfate.

## 2018-10-18 ENCOUNTER — Other Ambulatory Visit: Payer: Self-pay | Admitting: Family Medicine

## 2018-10-18 NOTE — Telephone Encounter (Signed)
Requested medication (s) are due for refill today: yes  Requested medication (s) are on the active medication list: yes  Last refill:  10/06/2018  Future visit scheduled: yes  Notes to clinic:  Not delegated    Requested Prescriptions  Pending Prescriptions Disp Refills   ondansetron (ZOFRAN) 4 MG tablet [Pharmacy Med Name: ONDANSETRON HCL 4 MG TABLET] 20 tablet 3    Sig: TAKE 1 TABLET BY MOUTH EVERY 8 HOURS AS NEEDED FOR NAUSEA AND VOMITING     Not Delegated - Gastroenterology: Antiemetics Failed - 10/18/2018  6:36 AM      Failed - This refill cannot be delegated      Passed - Valid encounter within last 6 months    Recent Outpatient Visits          1 week ago Hypothyroidism, unspecified type   [facility] Steele Sizer, MD   3 months ago Depression, unspecified depression type   [facility] Crissman, Redge Gainer, MD   6 months ago Diverticulitis of large intestine with perforation with bleeding   [facility] Crissman, Redge Gainer, MD   10 months ago Hyperlipidemia, unspecified hyperlipidemia type   [facility] Crissman, Redge Gainer, MD   1 year ago Hypothyroidism, unspecified type   [facility] Crissman, Redge Gainer, MD      Future Appointments            In 3 weeks [name], MD [facility]   In 2 months Crissman, Redge Gainer, MD [facility]

## 2018-11-09 ENCOUNTER — Ambulatory Visit: Payer: Medicare HMO | Admitting: Gastroenterology

## 2018-11-28 ENCOUNTER — Other Ambulatory Visit: Payer: Self-pay | Admitting: Family Medicine

## 2018-11-29 NOTE — Telephone Encounter (Signed)
Requested medication (s) are due for refill today - may be requesting refill early depending on how often patient is using  Requested medication (s) are on the active medication list -yes  Future visit scheduled -yes  Last refill: 11/19/18  Notes to clinic: Patient is requesting non delegated Rx- sent for provider review   Requested Prescriptions  Pending Prescriptions Disp Refills   ondansetron (ZOFRAN) 4 MG tablet [Pharmacy Med Name: ONDANSETRON HCL 4 MG TABLET] 20 tablet 3    Sig: TAKE 1 TABLET BY MOUTH EVERY 8 HOURS AS NEEDED FOR NAUSEA AND VOMITING     Not Delegated - Gastroenterology: Antiemetics Failed - 11/28/2018  9:44 PM      Failed - This refill cannot be delegated      Passed - Valid encounter within last 6 months    Recent Outpatient Visits          1 month ago Hypothyroidism, unspecified type   [facility] Crissman, Redge Gainer, MD   5 months ago Depression, unspecified depression type   [facility] Crissman, Redge Gainer, MD   7 months ago Diverticulitis of large intestine with perforation with bleeding   [facility] Crissman, Redge Gainer, MD   11 months ago Hyperlipidemia, unspecified hyperlipidemia type   [facility] Crissman, Redge Gainer, MD   1 year ago Hypothyroidism, unspecified type   [facility] Crissman, Redge Gainer, MD      Future Appointments            In 2 weeks [name], MD [facility] [place]   In 1 month Crissman, Redge Gainer, MD [facility]            Requested Prescriptions  Pending Prescriptions Disp Refills   ondansetron (ZOFRAN) 4 MG tablet [Pharmacy Med Name: ONDANSETRON HCL 4 MG TABLET] 20 tablet 3    Sig: TAKE 1 TABLET BY MOUTH EVERY 8 HOURS AS NEEDED FOR NAUSEA AND VOMITING     Not Delegated - Gastroenterology: Antiemetics Failed - 11/28/2018  9:44 PM      Failed - This refill cannot be delegated      Passed - Valid encounter within last 6 months    Recent Outpatient  Visits          1 month ago Hypothyroidism, unspecified type   [facility] Crissman, Redge Gainer, MD   5 months ago Depression, unspecified depression type   [facility] Crissman, Redge Gainer, MD   7 months ago Diverticulitis of large intestine with perforation with bleeding   [facility] Crissman, Redge Gainer, MD   11 months ago Hyperlipidemia, unspecified hyperlipidemia type   [facility] Crissman, Redge Gainer, MD   1 year ago Hypothyroidism, unspecified type   [facility] Crissman, Redge Gainer, MD      Future Appointments            In 2 weeks [name], MD [facility] [place]   In 1 month Crissman, Redge Gainer, MD [facility]

## 2018-12-06 ENCOUNTER — Telehealth: Payer: Self-pay | Admitting: Family Medicine

## 2018-12-06 NOTE — Telephone Encounter (Unsigned)
Copied from CRM [id number]. Topic: Medicare AWV >> Dec 06, 2018 10:17 AM Earlyne Iba wrote: Called to schedule Medicare Annual Wellness Visit with the Nurse Health Advisor. No answer.  Left message on home machine to call Misty Stanley at [phone].  If patient returns call, please schedule AWV-I.  Patient is eligible per [facility] 11/27/2018.  Thank you! For any questions please contact: Trixie Rude at [phone] or Skype [email or web address]

## 2018-12-14 ENCOUNTER — Telehealth: Payer: Self-pay | Admitting: Gastroenterology

## 2018-12-14 ENCOUNTER — Telehealth: Payer: Self-pay | Admitting: Family Medicine

## 2018-12-14 ENCOUNTER — Ambulatory Visit: Payer: Medicare HMO | Admitting: Gastroenterology

## 2018-12-14 NOTE — Telephone Encounter (Signed)
Spoke with pt to cancel apt for 12/14/18 she is doing good but is having incontinents she states it might be from all the surgeries she has had and trying to get her muscle to work right again.  No nausea or vomiting at this time.

## 2018-12-14 NOTE — Telephone Encounter (Signed)
Copied from CRM [id number]. Topic: Quick Communication - See Telephone Encounter >> Dec 14, 2018  4:20 PM [name], [name] wrote: CRM for notification. See Telephone encounter for: 12/14/18. Patient calling and states that she thinks she has allergies. States that she has a cough with phlegm, runny nose-yellow color, and scratchy throat. Denied travel or any known contact. Would like to know if Dr Dossie Arbour could send something to the pharmacy since she does not want to come out in public with everything corona going on.  Please advise.  [id number] - [place], [place] - [address]

## 2018-12-15 ENCOUNTER — Other Ambulatory Visit: Payer: Self-pay | Admitting: Family Medicine

## 2018-12-15 MED ORDER — AZITHROMYCIN 250 MG PO TABS: ORAL_TABLET | ORAL | 0 refills | Status: DC

## 2018-12-15 NOTE — Telephone Encounter (Signed)
Call pt rx sent 

## 2018-12-15 NOTE — Telephone Encounter (Signed)
Spoke with pt and informed her of Dr. Johnney Killian suggestions for pt to increase fiber in diet as well as trial of imodium if fiber diet does not help. Pt agrees.

## 2018-12-15 NOTE — Telephone Encounter (Signed)
Message relayed to patient. Verbalized understanding and denied questions.   

## 2018-12-15 NOTE — Telephone Encounter (Signed)
Non urgent message: increase diet in fiber and reschedule follow up in a few months. IF still has ioncontinence after fiber trial of imodium

## 2018-12-25 ENCOUNTER — Other Ambulatory Visit: Payer: Self-pay | Admitting: Family Medicine

## 2018-12-25 DIAGNOSIS — F32A Depression, unspecified: Secondary | ICD-10-CM

## 2018-12-25 DIAGNOSIS — F329 Major depressive disorder, single episode, unspecified: Secondary | ICD-10-CM

## 2019-01-04 ENCOUNTER — Telehealth: Payer: Self-pay

## 2019-01-04 NOTE — Telephone Encounter (Signed)
Due to covid-19 pandemic, annual wellness visit was rescheduled from 01/10/2019 to 03/30/2019, patient confirmed. Will mail appt reminder per pt request  Patient declined video visit, she does not have access any video chat systems.  She also has an appt with Dr.Crissman on 4/13, she wants to know if she can do phone visit as she wants to discuss going back to work and allergy meds.  Will fwd to Dr.Crissman.

## 2019-01-04 NOTE — Telephone Encounter (Signed)
Patient notified

## 2019-01-04 NOTE — Telephone Encounter (Signed)
Phone is fine for all my visits

## 2019-01-04 NOTE — Telephone Encounter (Signed)
Phone is fine for all my visits 

## 2019-01-09 ENCOUNTER — Ambulatory Visit: Payer: Medicare HMO

## 2019-01-09 ENCOUNTER — Encounter: Payer: Self-pay | Admitting: Family Medicine

## 2019-01-09 ENCOUNTER — Ambulatory Visit (INDEPENDENT_AMBULATORY_CARE_PROVIDER_SITE_OTHER): Payer: Medicare HMO | Admitting: Family Medicine

## 2019-01-09 DIAGNOSIS — E785 Hyperlipidemia, unspecified: Secondary | ICD-10-CM | POA: Diagnosis not present

## 2019-01-09 DIAGNOSIS — F419 Anxiety disorder, unspecified: Secondary | ICD-10-CM

## 2019-01-09 DIAGNOSIS — H8110 Benign paroxysmal vertigo, unspecified ear: Secondary | ICD-10-CM

## 2019-01-09 DIAGNOSIS — F329 Major depressive disorder, single episode, unspecified: Secondary | ICD-10-CM | POA: Diagnosis not present

## 2019-01-09 DIAGNOSIS — F32A Depression, unspecified: Secondary | ICD-10-CM

## 2019-01-09 DIAGNOSIS — E039 Hypothyroidism, unspecified: Secondary | ICD-10-CM | POA: Diagnosis not present

## 2019-01-09 DIAGNOSIS — R69 Illness, unspecified: Secondary | ICD-10-CM | POA: Diagnosis not present

## 2019-01-09 MED ORDER — DIAZEPAM 5 MG PO TABS: 5.0000 mg | ORAL_TABLET | Freq: Two times a day (BID) | ORAL | 3 refills | Status: DC

## 2019-01-09 MED ORDER — BUPROPION HCL ER (SR) 150 MG PO TB12: 150.0000 mg | ORAL_TABLET | Freq: Two times a day (BID) | ORAL | 2 refills | Status: DC

## 2019-01-09 MED ORDER — PANTOPRAZOLE SODIUM 40 MG PO TBEC: 40.0000 mg | DELAYED_RELEASE_TABLET | Freq: Two times a day (BID) | ORAL | 1 refills | Status: DC

## 2019-01-09 NOTE — Progress Notes (Signed)
There were no vitals taken for this visit.   Subjective:    Patient ID: Annette Miller, female    DOB: [birth year], 68 y.o.   MRN: [id number]  HPI: Annette Miller is a 68 y.o. female  Med check and vertigo with falling. Reviewed patient's vertigo and patient mostly with positional changes such as rolling over or sitting up in bed will then fall off the edge of the bed.  She is also had sudden episodes of marked vertigo with head motion and having to hold on to keep from falling.  These episodes last less than a minute.  Is been getting more severe over the last month or so. Patient has not been able to check her blood pressure on standing. Depression and nerves are stable. Reviewed medications taking cholesterol and thyroid medications without problems. Allergy medicine does not seem to be working has been taking Allegra faithfully. Discussed changing medications and staying off medicines for several days and possible ability of using Flonase. Takes occasional nausea medication for her vertigo. Taking gabapentin without problems along with reflux medicines. Discussed Valium usage and possible vertigo.  Patient takes 1 in the morning 1 at night.  Reviewed decreasing the Valium 5 mg each morning to 2.5 mg.  And maybe even not taking morning dose observing vertigo.   Telemedicine using audio/video telecommunications for a synchronous communication visit. Today's visit due to COVID-19 isolation precautions I connected with and verified that I am speaking with the correct person using two identifiers.   I discussed the limitations, risks, security and privacy concerns of performing an evaluation and management service by telecommunication and the availability of in person appointments. I also discussed with the patient that there may be a patient responsible charge related to this service. The patient expressed understanding and agreed to proceed. The patient's location is home. I am at  home.  Relevant past medical, surgical, family and social history reviewed and updated as indicated. Interim medical history since our last visit reviewed. Allergies and medications reviewed and updated.  Review of Systems  Constitutional: Negative.   Respiratory: Negative.   Cardiovascular: Negative.     Per HPI unless specifically indicated above     Objective:    There were no vitals taken for this visit.  Wt Readings from Last 3 Encounters:  10/10/18 95 lb 6.4 oz (43.3 kg)  09/12/18 101 lb (45.8 kg)  08/30/18 100 lb (45.4 kg)    Physical Exam  Results for orders placed or performed in visit on 10/10/18  Comprehensive metabolic panel  Result Value Ref Range   Glucose 83 65 - 99 mg/dL   BUN 18 8 - 27 mg/dL   Creatinine, Ser 4.090.87 0.57 - 1.00 mg/dL   GFR calc non Af Amer 69 >59 mL/min/1.73   GFR calc Af Amer 80 >59 mL/min/1.73   BUN/Creatinine Ratio 21 12 - 28   Sodium 142 134 - 144 mmol/L   Potassium 4.0 3.5 - 5.2 mmol/L   Chloride 99 96 - 106 mmol/L   CO2 25 20 - 29 mmol/L   Calcium 9.8 8.7 - 10.3 mg/dL   Total Protein 7.4 6.0 - 8.5 g/dL   Albumin 3.7 3.6 - 4.8 g/dL   Globulin, Total 3.7 1.5 - 4.5 g/dL   Albumin/Globulin Ratio 1.0 (L) 1.2 - 2.2   Bilirubin Total <0.2 0.0 - 1.2 mg/dL   Alkaline Phosphatase 111 39 - 117 IU/L   AST 9 0 - 40 IU/L   ALT  8 0 - 32 IU/L  CBC with Differential/Platelet  Result Value Ref Range   WBC 8.6 3.4 - 10.8 x10E3/uL   RBC 2.83 (L) 3.77 - 5.28 x10E6/uL   Hemoglobin 9.4 (L) 11.1 - 15.9 g/dL   Hematocrit 60.6 (L) 77.0 - 46.6 %   MCV 99 (H) 79 - 97 fL   MCH 33.2 (H) 26.6 - 33.0 pg   MCHC 33.5 31.5 - 35.7 g/dL   RDW 34.0 35.2 - 48.1 %   Platelets 654 (H) 150 - 450 x10E3/uL   Neutrophils 65 Not Estab. %   Lymphs 25 Not Estab. %   Monocytes 6 Not Estab. %   Eos 3 Not Estab. %   Basos 1 Not Estab. %   Neutrophils Absolute 5.6 1.4 - 7.0 x10E3/uL   Lymphocytes Absolute 2.2 0.7 - 3.1 x10E3/uL   Monocytes Absolute 0.5 0.1 - 0.9  x10E3/uL   EOS (ABSOLUTE) 0.3 0.0 - 0.4 x10E3/uL   Basophils Absolute 0.1 0.0 - 0.2 x10E3/uL   Immature Granulocytes 0 Not Estab. %   Immature Grans (Abs) 0.0 0.0 - 0.1 x10E3/uL  TSH  Result Value Ref Range   TSH 0.367 (L) 0.450 - 4.500 uIU/mL      Assessment & Plan:   Problem List Items Addressed This Visit      Endocrine   Hypothyroidism    The current medical regimen is effective;  continue present plan and medications.         Nervous and Auditory   Benign positional vertigo    Patient with severe positional vertigo with falling episodes will refer to ear nose and throat to further evaluate vertigo and consider appropriate maneuvers if indicated.      Relevant Orders   Ambulatory referral to ENT     Other   Hyperlipidemia    The current medical regimen is effective;  continue present plan and medications.       Depression   Relevant Medications   buPROPion (WELLBUTRIN SR) 150 MG 12 hr tablet   diazepam (VALIUM) 5 MG tablet   Anxiety    Discussed anxiety care and treatment will continue Valium with cautions and trying to limit use.      Relevant Medications   buPROPion (WELLBUTRIN SR) 150 MG 12 hr tablet   diazepam (VALIUM) 5 MG tablet      I discussed the assessment and treatment plan with the patient. The patient was provided an opportunity to ask questions and all were answered. The patient agreed with the plan and demonstrated an understanding of the instructions.   The patient was advised to call back or seek an in-person evaluation if the symptoms worsen or if the condition fails to improve as anticipated.   I provided 21+ minutes of time during this encounter. Follow up plan: Return in about 3 months (around 04/10/2019) for Physical Exam.

## 2019-01-09 NOTE — Assessment & Plan Note (Signed)
The current medical regimen is effective;  continue present plan and medications.  

## 2019-01-09 NOTE — Assessment & Plan Note (Signed)
Discussed anxiety care and treatment will continue Valium with cautions and trying to limit use.

## 2019-01-09 NOTE — Assessment & Plan Note (Signed)
Patient with severe positional vertigo with falling episodes will refer to ear nose and throat to further evaluate vertigo and consider appropriate maneuvers if indicated.

## 2019-01-16 ENCOUNTER — Other Ambulatory Visit: Payer: Self-pay | Admitting: Family Medicine

## 2019-01-16 DIAGNOSIS — T7840XD Allergy, unspecified, subsequent encounter: Secondary | ICD-10-CM

## 2019-01-16 MED ORDER — AZELASTINE HCL 0.1 % NA SOLN: 2.0000 | Freq: Two times a day (BID) | NASAL | 12 refills | Status: DC

## 2019-01-16 NOTE — Telephone Encounter (Signed)
Not on active med list  

## 2019-01-24 ENCOUNTER — Other Ambulatory Visit: Payer: Self-pay | Admitting: Family Medicine

## 2019-01-25 ENCOUNTER — Ambulatory Visit: Payer: Medicare HMO | Admitting: Gastroenterology

## 2019-01-30 ENCOUNTER — Telehealth: Payer: Self-pay | Admitting: Family Medicine

## 2019-01-30 NOTE — Telephone Encounter (Signed)
Copied from CRM [id number]. Topic: General - Inquiry >> Jan 30, 2019 10:02 AM Lorrine Kin, NT wrote: Reason for CRM: Patient calling and states that she would like a call from Dr Dossie Arbour. 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 Would like to know if Dr Dossie Arbour thinks this would be okay for her to accept? States that the last time she had seen him, he told her he did not recommend going back to work. Please advise.  [phone]

## 2019-02-03 ENCOUNTER — Other Ambulatory Visit: Payer: Self-pay | Admitting: Family Medicine

## 2019-02-03 NOTE — Telephone Encounter (Signed)
Requested medication (s) are due for refill today:   Non-delegated medication  Requested medication (s) are on the active medication list:   Yes  Future visit scheduled:   Yes in 1 month with Dr. Dossie Arbour   Last ordered: 33/2020  #20  3 refills   Returned because it's a non-delegated medication.  Requested Prescriptions  Pending Prescriptions Disp Refills   ondansetron (ZOFRAN) 4 MG tablet [Pharmacy Med Name: ONDANSETRON HCL 4 MG TABLET] 20 tablet 3    Sig: TAKE 1 TABLET BY MOUTH EVERY 8 HOURS AS NEEDED FOR NAUSEA AND VOMITING     Not Delegated - Gastroenterology: Antiemetics Failed - 02/03/2019  8:36 AM      Failed - This refill cannot be delegated      Passed - Valid encounter within last 6 months    Recent Outpatient Visits          3 weeks ago Depression, unspecified depression type   [facility] Crissman, Redge Gainer, MD   3 months ago Hypothyroidism, unspecified type   [facility] Crissman, Redge Gainer, MD   7 months ago Depression, unspecified depression type   [facility] Crissman, Redge Gainer, MD   9 months ago Diverticulitis of large intestine with perforation with bleeding   [facility] Crissman, Redge Gainer, MD   1 year ago Hyperlipidemia, unspecified hyperlipidemia type   [facility] Crissman, Redge Gainer, MD      Future Appointments            In 1 month  [facility]   In 1 month [name], MD [facility] [place]

## 2019-02-06 ENCOUNTER — Ambulatory Visit (INDEPENDENT_AMBULATORY_CARE_PROVIDER_SITE_OTHER): Payer: Medicare HMO | Admitting: Family Medicine

## 2019-02-06 ENCOUNTER — Encounter: Payer: Self-pay | Admitting: Family Medicine

## 2019-02-06 ENCOUNTER — Other Ambulatory Visit: Payer: Self-pay

## 2019-02-06 ENCOUNTER — Ambulatory Visit: Payer: Self-pay | Admitting: Family Medicine

## 2019-02-06 DIAGNOSIS — J019 Acute sinusitis, unspecified: Secondary | ICD-10-CM

## 2019-02-06 MED ORDER — AMOXICILLIN-POT CLAVULANATE 875-125 MG PO TABS: 1.0000 | ORAL_TABLET | Freq: Two times a day (BID) | ORAL | 0 refills | Status: DC

## 2019-02-06 NOTE — Progress Notes (Signed)
There were no vitals taken for this visit.   Subjective:    Patient ID: Annette Miller, female    DOB: December 30, 1950, 68 y.o.   MRN: [id number]  HPI: Annette Miller is a 68 y.o. female  Sinus infection  Telemedicine using audio/video telecommunications for a synchronous communication visit. Today's visit due to COVID-19 isolation precautions I connected with and verified that I am speaking with the correct person using two identifiers.   I discussed the limitations, risks, security and privacy concerns of performing an evaluation and management service by telecommunication and the availability of in person appointments. I also discussed with the patient that there may be a patient responsible charge related to this service. The patient expressed understanding and agreed to proceed. The patient's location is home. I am at home.   Discussed with patient worsening sinus pressure congestion drainage.it started is more allergies with just nasal congestion and allergic to pollen with some sneezing but now has developed marked facial pressure congestions tried some OTC medicines with minimal help.  And over this last week has gotten worse. Patient with some systemic symptoms of generalized fatigue but no other COVID-19 type symptoms. Reviewed patient's immune status and cautions.  Patient is recovered with good energy and back to her normal weight.  Relevant past medical, surgical, family and social history reviewed and updated as indicated. Interim medical history since our last visit reviewed. Allergies and medications reviewed and updated.  Review of Systems  Constitutional: Positive for fatigue. Negative for chills, diaphoresis and fever.  HENT: Positive for congestion, postnasal drip, rhinorrhea, sinus pressure, sinus pain, sneezing and sore throat.   Respiratory: Negative for apnea, cough, choking, chest tightness, shortness of breath, wheezing and stridor.   Cardiovascular: Negative.      Per HPI unless specifically indicated above     Objective:    There were no vitals taken for this visit.  Wt Readings from Last 3 Encounters:  10/10/18 95 lb 6.4 oz (43.3 kg)  09/12/18 101 lb (45.8 kg)  08/30/18 100 lb (45.4 kg)    Physical Exam  Results for orders placed or performed in visit on 10/10/18  Comprehensive metabolic panel  Result Value Ref Range   Glucose 83 65 - 99 mg/dL   BUN 18 8 - 27 mg/dL   Creatinine, Ser 1.60 0.57 - 1.00 mg/dL   GFR calc non Af Amer 69 >59 mL/min/1.73   GFR calc Af Amer 80 >59 mL/min/1.73   BUN/Creatinine Ratio 21 12 - 28   Sodium 142 134 - 144 mmol/L   Potassium 4.0 3.5 - 5.2 mmol/L   Chloride 99 96 - 106 mmol/L   CO2 25 20 - 29 mmol/L   Calcium 9.8 8.7 - 10.3 mg/dL   Total Protein 7.4 6.0 - 8.5 g/dL   Albumin 3.7 3.6 - 4.8 g/dL   Globulin, Total 3.7 1.5 - 4.5 g/dL   Albumin/Globulin Ratio 1.0 (L) 1.2 - 2.2   Bilirubin Total <0.2 0.0 - 1.2 mg/dL   Alkaline Phosphatase 111 39 - 117 IU/L   AST 9 0 - 40 IU/L   ALT 8 0 - 32 IU/L  CBC with Differential/Platelet  Result Value Ref Range   WBC 8.6 3.4 - 10.8 x10E3/uL   RBC 2.83 (L) 3.77 - 5.28 x10E6/uL   Hemoglobin 9.4 (L) 11.1 - 15.9 g/dL   Hematocrit 73.7 (L) 10.6 - 46.6 %   MCV 99 (H) 79 - 97 fL   MCH 33.2 (H) 26.6 -  33.0 pg   MCHC 33.5 31.5 - 35.7 g/dL   RDW 69.611.8 29.511.7 - 28.415.4 %   Platelets 654 (H) 150 - 450 x10E3/uL   Neutrophils 65 Not Estab. %   Lymphs 25 Not Estab. %   Monocytes 6 Not Estab. %   Eos 3 Not Estab. %   Basos 1 Not Estab. %   Neutrophils Absolute 5.6 1.4 - 7.0 x10E3/uL   Lymphocytes Absolute 2.2 0.7 - 3.1 x10E3/uL   Monocytes Absolute 0.5 0.1 - 0.9 x10E3/uL   EOS (ABSOLUTE) 0.3 0.0 - 0.4 x10E3/uL   Basophils Absolute 0.1 0.0 - 0.2 x10E3/uL   Immature Granulocytes 0 Not Estab. %   Immature Grans (Abs) 0.0 0.0 - 0.1 x10E3/uL  TSH  Result Value Ref Range   TSH 0.367 (L) 0.450 - 4.500 uIU/mL      Assessment & Plan:   Problem List Items Addressed This  Visit    None    Visit Diagnoses    Acute sinusitis, recurrence not specified, unspecified location    -  Primary   Relevant Medications   amoxicillin-clavulanate (AUGMENTIN) 875-125 MG tablet    Discussed care and treatment use of Augmentin with cautions, use of over-the-counter medications with cautions and expected course. Discussed continued COVID-19 precautions   I discussed the assessment and treatment plan with the patient. The patient was provided an opportunity to ask questions and all were answered. The patient agreed with the plan and demonstrated an understanding of the instructions.   The patient was advised to call back or seek an in-person evaluation if the symptoms worsen or if the condition fails to improve as anticipated.   I provided 21+ minutes of time during this encounter.  Follow up plan: Return if symptoms worsen or fail to improve, for As scheduled.

## 2019-03-12 IMAGING — CT CT ABD-PELV W/ CM
2 of 5 series · 15 of 46 positions shown, 17 images · IV contrast (APPLIED)
Comparison: CT abdomen pelvis dated February 25, 2018.

CLINICAL DATA: Left upper quadrant abdominal pain with nausea and
vomiting. Recent partial colectomy and colostomy on 02/25/2018 for
perforated sigmoid diverticulitis.

EXAM:
CT ABDOMEN AND PELVIS WITH CONTRAST
TECHNIQUE: Multidetector CT imaging of the abdomen and pelvis was performed
using the standard protocol following bolus administration of
intravenous contrast.
CONTRAST:  75mL OMNIPAQUE IOHEXOL 300 MG/ML  SOLN

[Series 2: routine abd/pel with · axial · 0.60mm/px · z∈[-731,-351]mm · 12 of 86 slices shown, 14 images]
[im 5/86  soft-tissue]
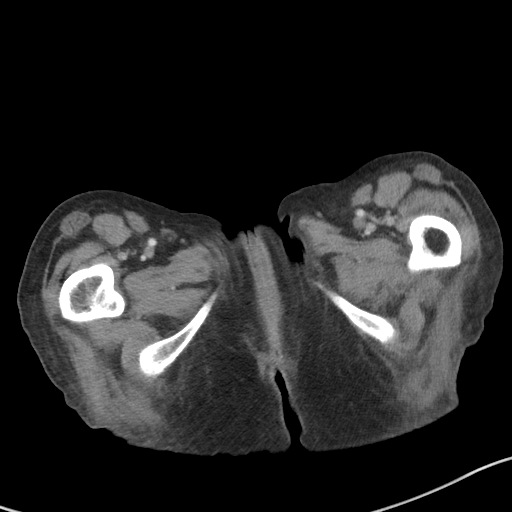
[im 5/86  bone]
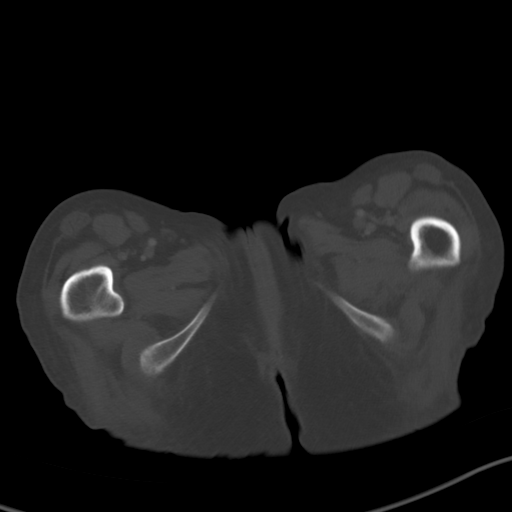
[im 14/86  soft-tissue]
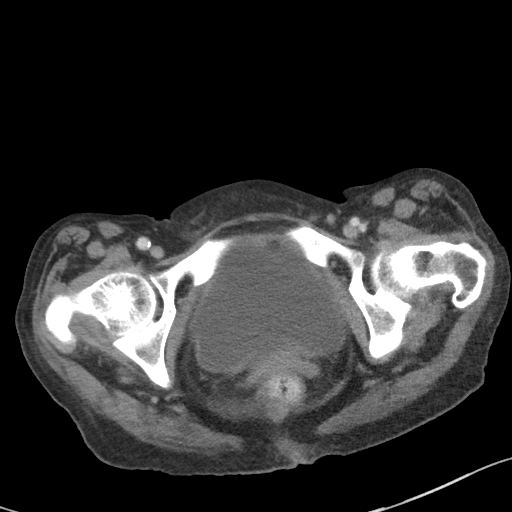
[im 18/86  soft-tissue]
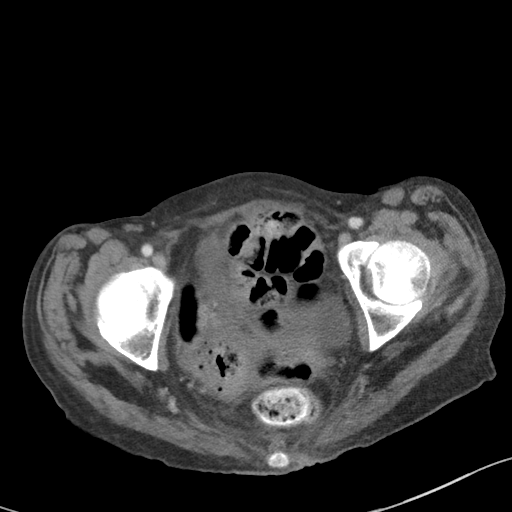
[im 27/86  soft-tissue]
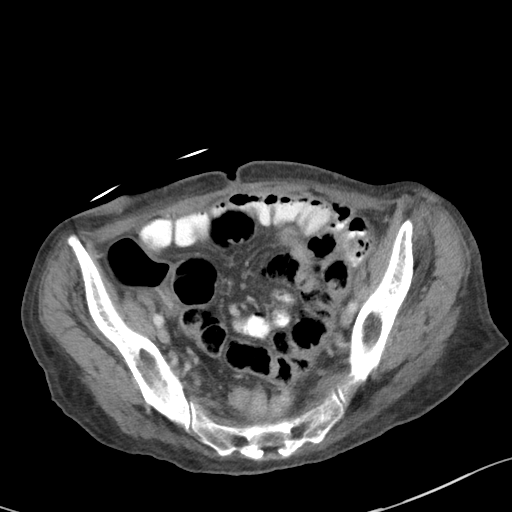
[im 32/86  soft-tissue]
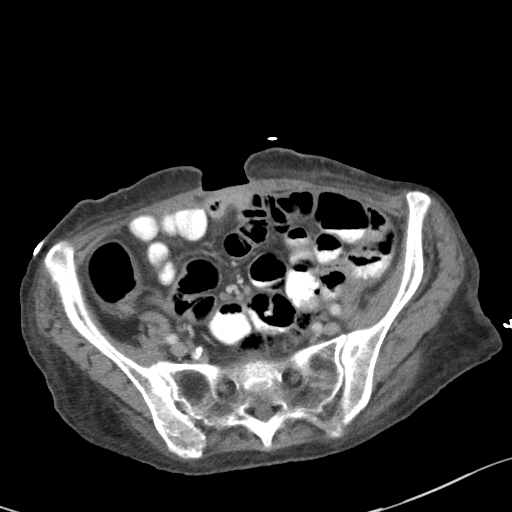
[im 41/86  soft-tissue]
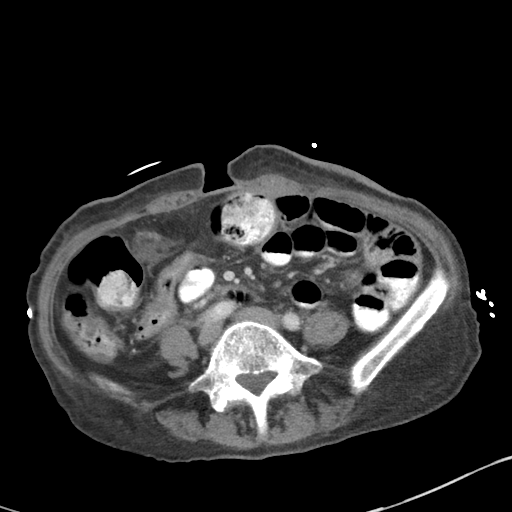
[im 45/86  soft-tissue]
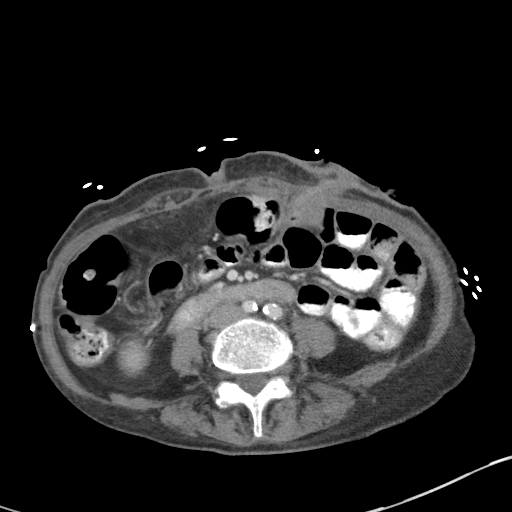
[im 54/86  soft-tissue]
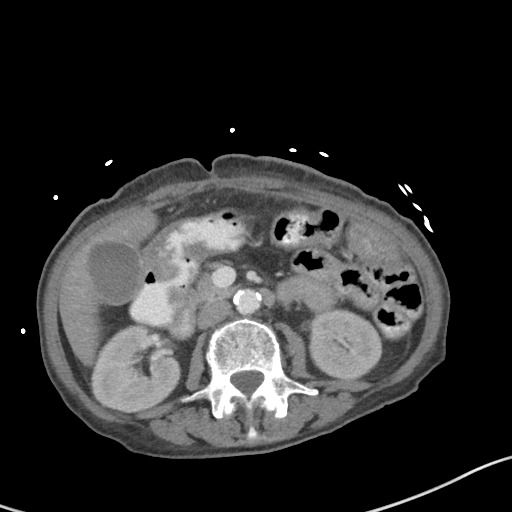
[im 59/86  soft-tissue]
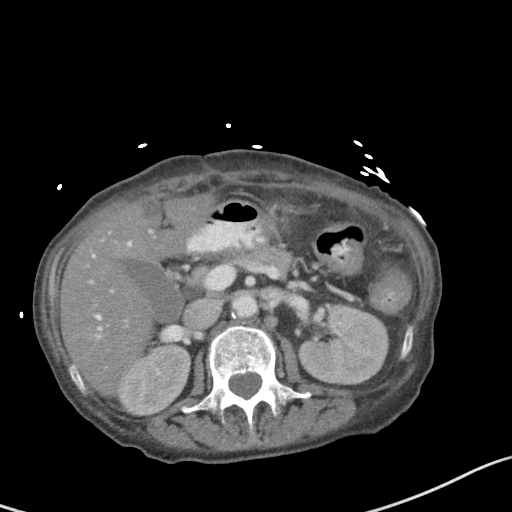
[im 59/86  bone]
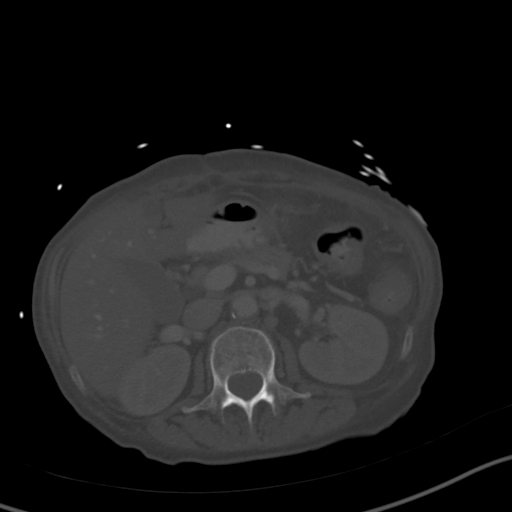
[im 68/86  soft-tissue]
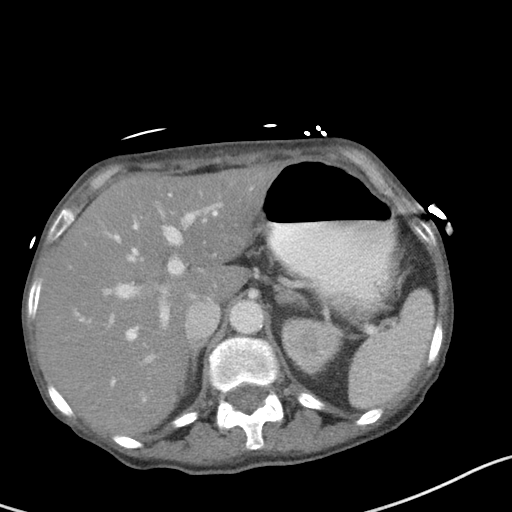
[im 72/86  soft-tissue]
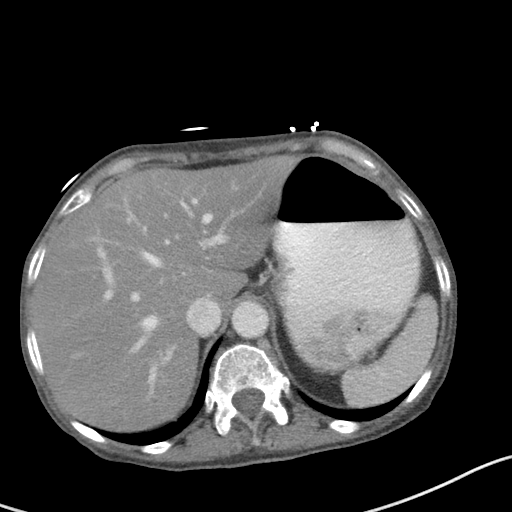
[im 81/86  soft-tissue]
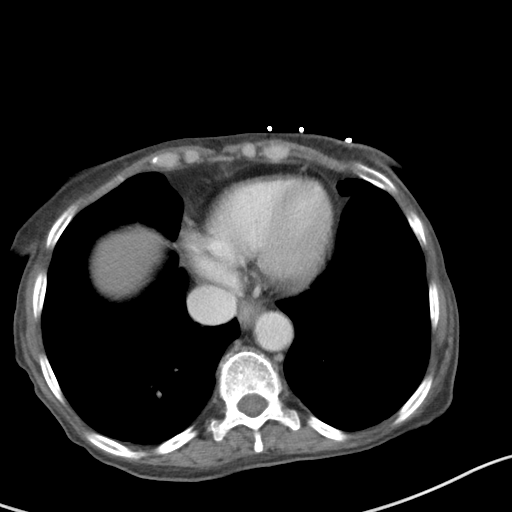

[Series 5: coronal st · coronal · 0.60mm/px · 3 of 67 slices shown]
[im 23/67  soft-tissue]
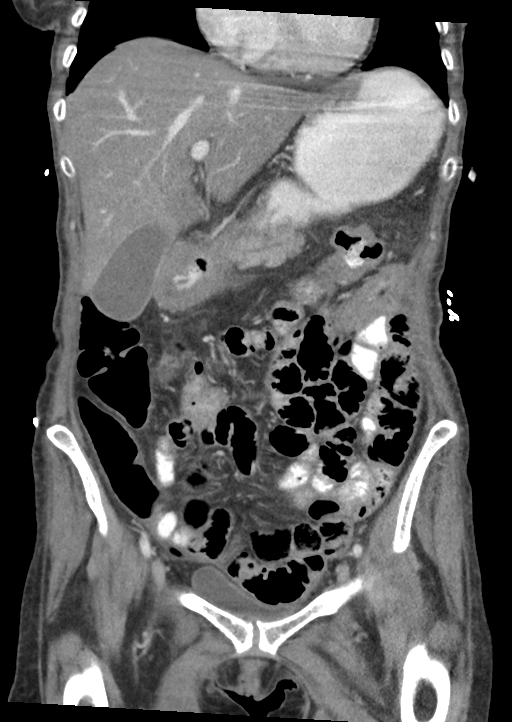
[im 30/67  soft-tissue]
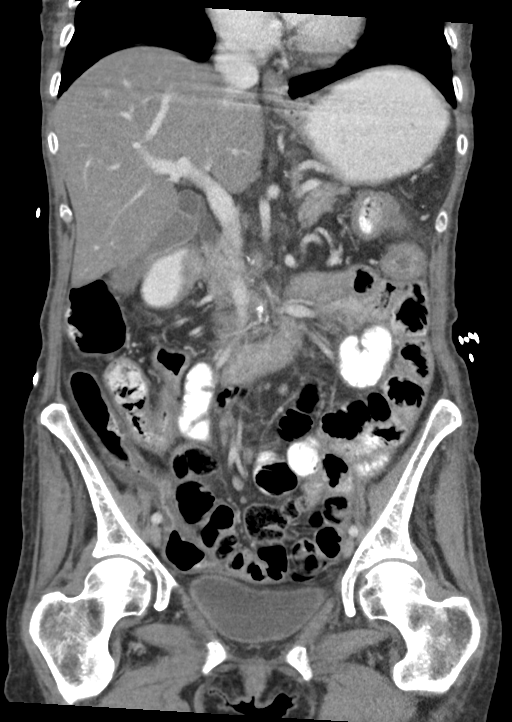
[im 37/67  soft-tissue]
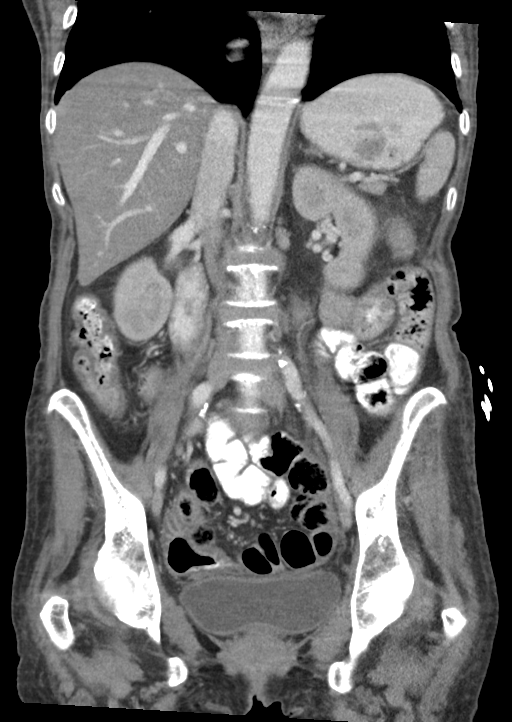

[15 of 46 positions shown; findings below may reference images not displayed]

FINDINGS: Lower chest: No acute abnormality.

Hepatobiliary: Diffuse hepatic steatosis. The gallbladder remains
mildly distended. No gallbladder wall thickening or gallstones.
Unchanged mild dilatation of the common bile duct, measuring up to
10 mm..

Pancreas: Unremarkable. No pancreatic ductal dilatation or
surrounding inflammatory changes.

Spleen: Normal in size without focal abnormality.

Adrenals/Urinary Tract: Adrenal glands are unremarkable. Kidneys are
normal, without renal calculi, focal lesion, or hydronephrosis.
Bladder is unremarkable.

Stomach/Bowel: Postsurgical changes related to sigmoid colectomy
with left lower quadrant colostomy. There is moderate
circumferential wall thickening of the proximal descending colon
extending to the ostomy. The appendix is surgically absent.
Improving but persistent wall thickening along the antrum of the
stomach and first portion of the duodenum. Fecalization of distal
small bowel contents likely represents delayed transit. No bowel
obstruction.

Vascular/Lymphatic: Aortic atherosclerosis. No enlarged abdominal or
pelvic lymph nodes.

Reproductive: Status post hysterectomy. No adnexal masses.

Other: 2.0 x 2.5 cm rounded area of inflammatory fat stranding in
the right mid abdomen. No free fluid or pneumoperitoneum. Mild
presacral inflammatory stranding. Open midline abdominal surgical
incision.

Musculoskeletal: No acute or significant osseous findings.
IMPRESSION: 1. Postsurgical changes related to sigmoid colectomy with left lower
quadrant colostomy. Moderate circumferential wall thickening of the
proximal descending colon extending to the ostomy, consistent with
colitis.
2. Improving but persistent wall thickening along the antrum of the
stomach and first portion of the duodenum with adjacent prominent
lymph nodes which are slightly increased in size, now measuring up
to 8 mm in short axis. Correlation with endoscopy is recommended if
not previously performed.
3. Unchanged mild dilatation of the common bile duct and gallbladder
distention. Correlate with LFTs and consider further evaluation with
ERCP or MRCP as clinically indicated.
4. New 2.0 x 2.5 cm rounded area of inflammatory fat stranding in
the right mid abdomen may represent omental infarct.

## 2019-03-13 IMAGING — DX DG ABDOMEN 1V
1 series · 1 of 1 positions shown · non-contrast
Comparison: Radiograph March 06, 2018.

CLINICAL DATA: Orogastric tube placement.

EXAM:
ABDOMEN - 1 VIEW

[abdomen supine]
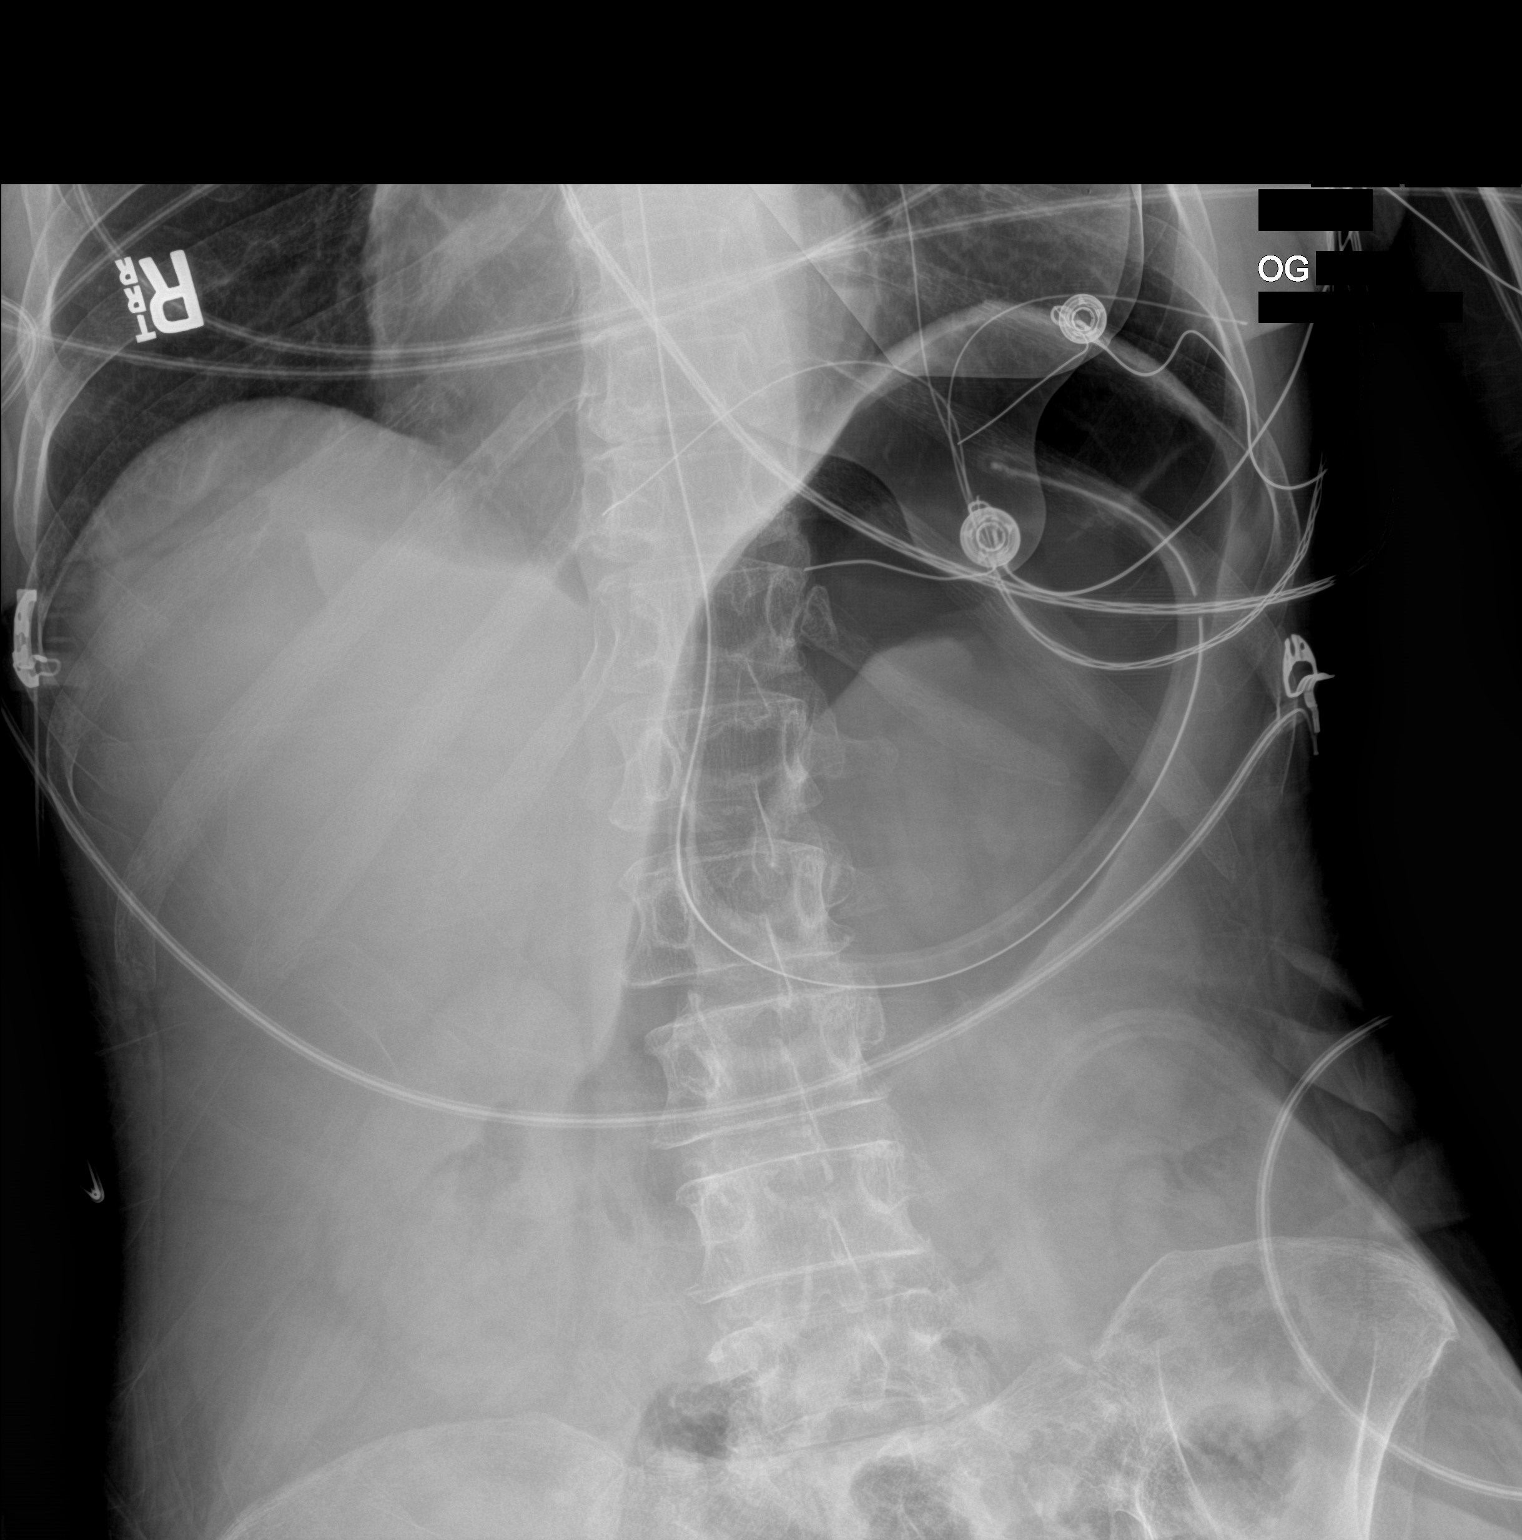

[1 of 1 positions shown; findings below may reference images not displayed]

FINDINGS: The bowel gas pattern is normal. No radio-opaque calculi or other
significant radiographic abnormality are seen. Distal tip of
nasogastric tube is seen in proximal stomach.
IMPRESSION: No evidence of bowel obstruction or ileus. Distal tip of nasogastric
tube is seen in proximal stomach.

## 2019-03-13 IMAGING — DX DG CHEST 1V PORT
1 series · 1 of 1 positions shown · non-contrast
Comparison: Radiographs April 05, 2018.

CLINICAL DATA: Endotracheal tube placement.

EXAM:
PORTABLE CHEST 1 VIEW

[chest ap]
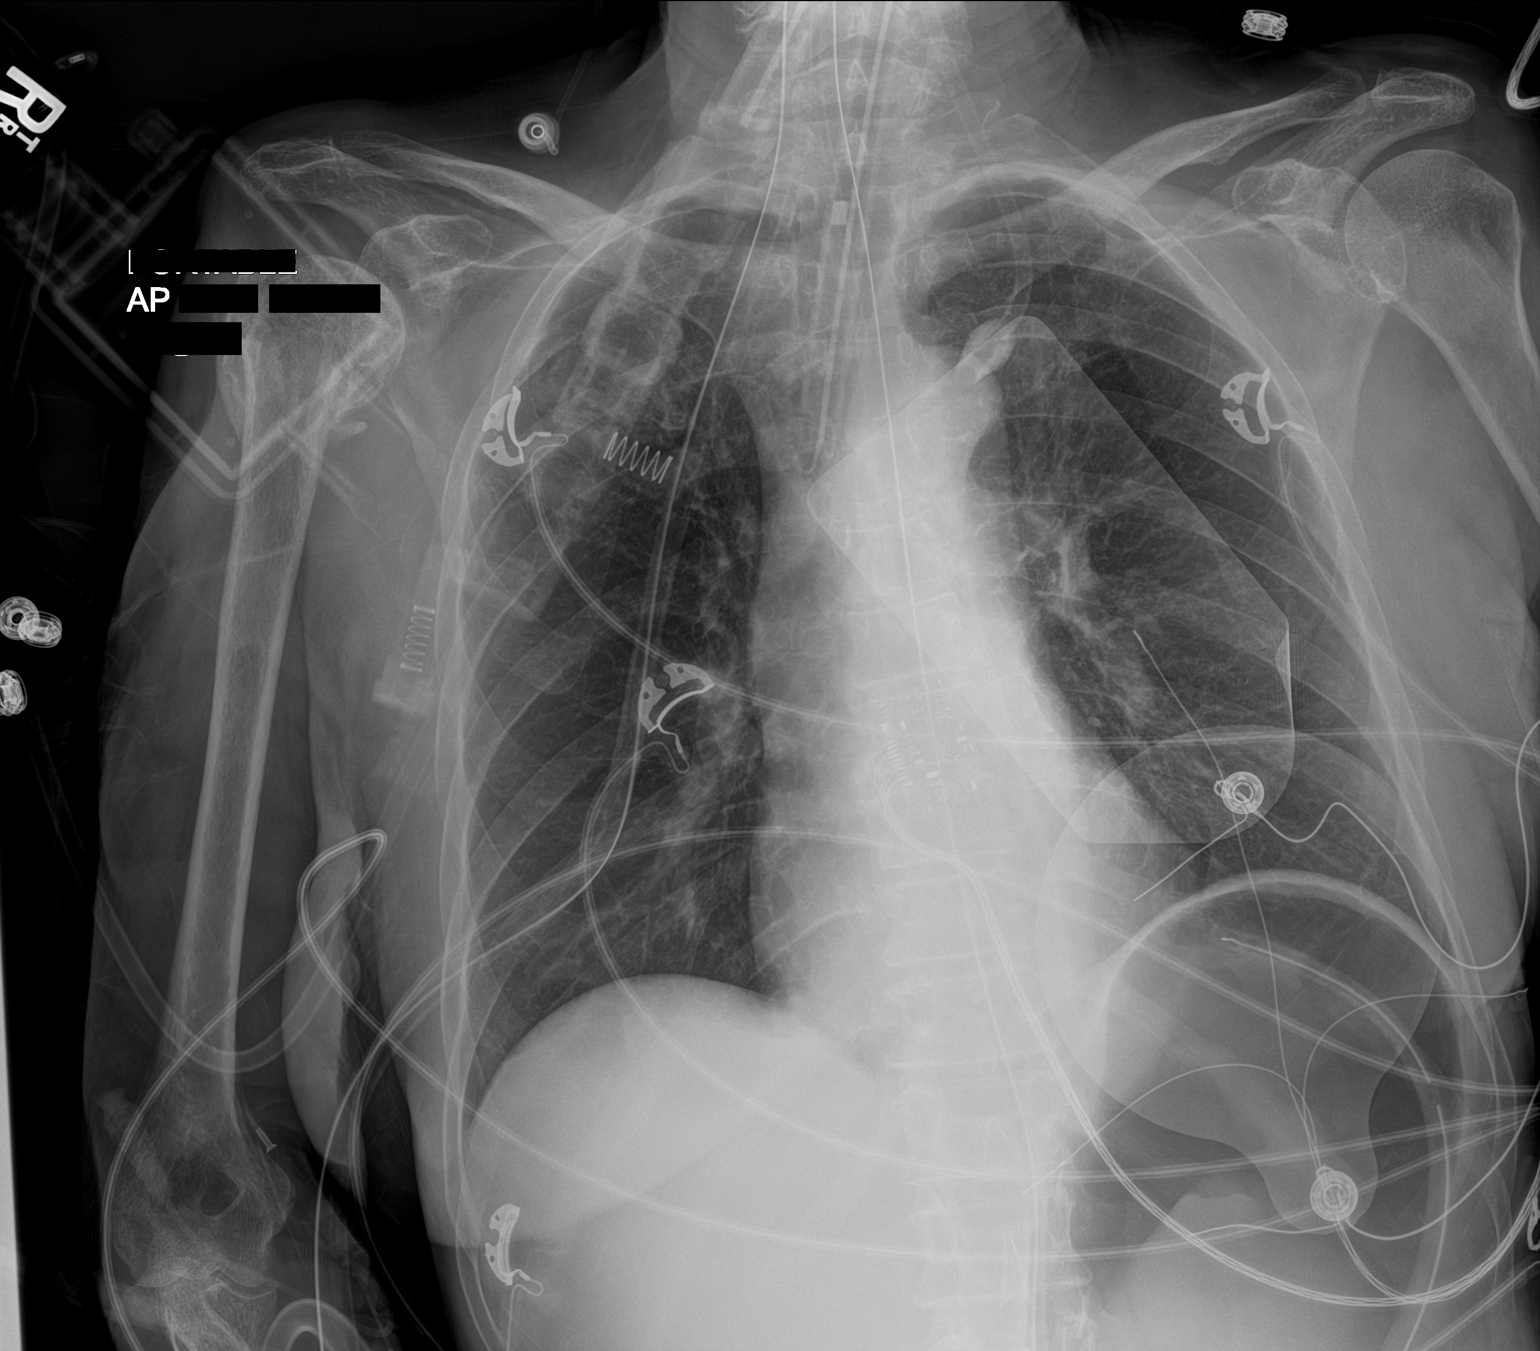

[1 of 1 positions shown; findings below may reference images not displayed]

FINDINGS: The heart size and mediastinal contours are within normal limits.
Endotracheal tube is seen projected over tracheal air shadow with
distal tip 2 cm above the carina. Nasogastric tube tip is seen in
proximal stomach. There is no evidence of bowel obstruction or
ileus. Both lungs are clear. Stable old proximal right humeral
fracture is noted.
IMPRESSION: Endotracheal and nasogastric tubes are in grossly good position. No
acute pulmonary abnormality is noted.

## 2019-03-13 IMAGING — DX DG CHEST 1V PORT
1 series · 1 of 1 positions shown · non-contrast
Comparison: Earlier today

CLINICAL DATA: Encounter for central line placement

EXAM:
PORTABLE CHEST 1 VIEW

[chest ap]
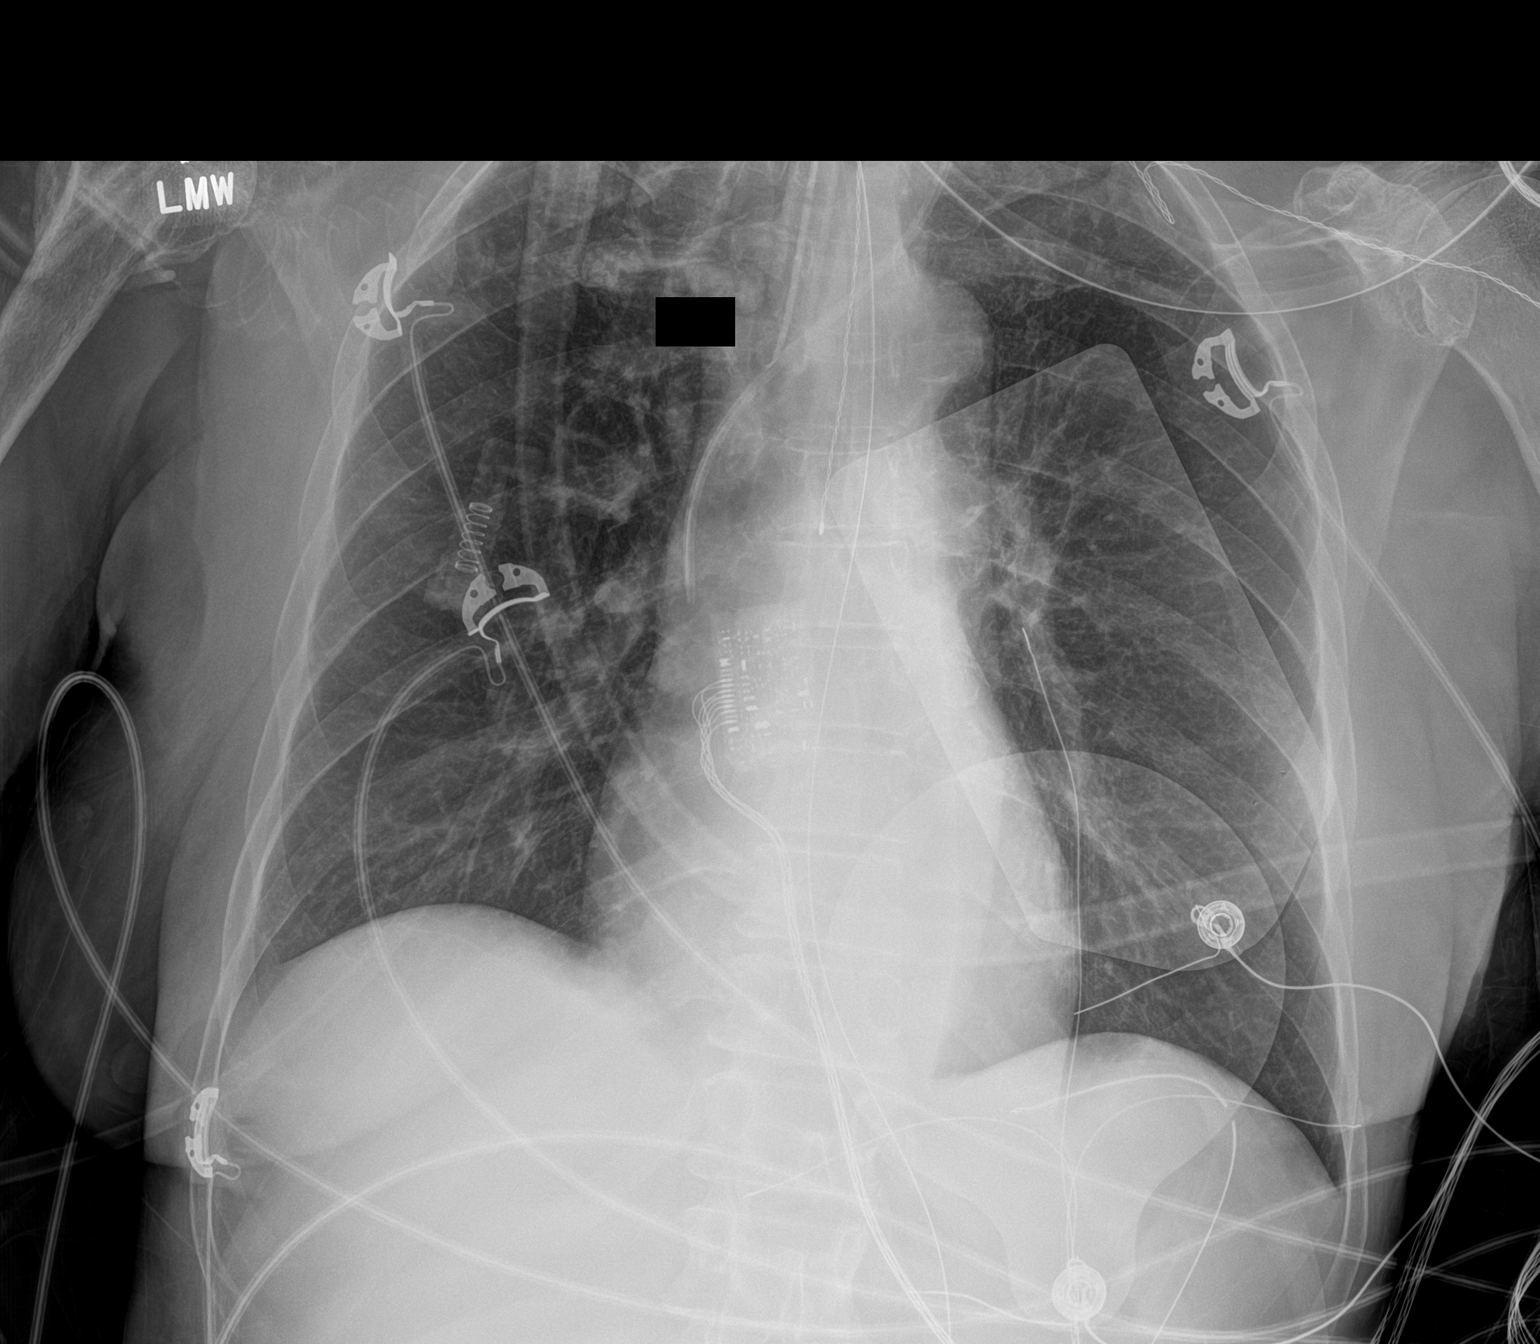

[1 of 1 positions shown; findings below may reference images not displayed]

FINDINGS: New left IJ line with tip at the SVC. No pneumothorax or mediastinal
widening. Endotracheal tube tip between the clavicular heads and
carina. An orogastric tube tip is at the gastric fundus. The lungs
remain clear. Normal heart size
IMPRESSION: 1. No adverse finding after left IJ line placement.
2. No evidence of active cardiopulmonary disease.

## 2019-03-23 ENCOUNTER — Other Ambulatory Visit: Payer: Self-pay | Admitting: Family Medicine

## 2019-03-23 DIAGNOSIS — F32A Depression, unspecified: Secondary | ICD-10-CM

## 2019-03-23 DIAGNOSIS — F329 Major depressive disorder, single episode, unspecified: Secondary | ICD-10-CM

## 2019-03-23 NOTE — Telephone Encounter (Signed)
Forwarding medication refill to PCP for review. 

## 2019-03-30 ENCOUNTER — Ambulatory Visit: Payer: Medicare HMO

## 2019-03-30 ENCOUNTER — Ambulatory Visit: Payer: Medicare HMO | Admitting: Gastroenterology

## 2019-04-03 ENCOUNTER — Ambulatory Visit (INDEPENDENT_AMBULATORY_CARE_PROVIDER_SITE_OTHER): Payer: Medicare HMO

## 2019-04-03 VITALS — Ht 62.0 in | Wt 104.0 lb

## 2019-04-03 DIAGNOSIS — Z780 Asymptomatic menopausal state: Secondary | ICD-10-CM | POA: Diagnosis not present

## 2019-04-03 DIAGNOSIS — Z0000 Encounter for general adult medical examination without abnormal findings: Secondary | ICD-10-CM

## 2019-04-03 DIAGNOSIS — Z1239 Encounter for other screening for malignant neoplasm of breast: Secondary | ICD-10-CM

## 2019-04-03 NOTE — Patient Instructions (Signed)
Annette Miller , Thank you for taking time to come for your Medicare Wellness Visit. I appreciate your ongoing commitment to your health goals. Please review the following plan we discussed and let me know if I can assist you in the future.   Screening recommendations/referrals: Colonoscopy: completed 05/2018, follow up with GI  Mammogram: Please call [phone] to schedule your mammogram.  Bone Density: Please call [phone] to schedule your bone density  Recommended yearly ophthalmology/optometry visit for glaucoma screening and checkup Recommended yearly dental visit for hygiene and checkup  Vaccinations: Influenza vaccine: up to date Pneumococcal vaccine: to get at next in office visit  Tdap vaccine: due, check with your insurance company for coverage  Shingles vaccine: shingrix eligible, check with your insurance company for coverage     Advanced directives: copy on file    Next appointment: Follow up in one year for your annual wellness exam.    Preventive Care 65 Years and Older, Female Preventive care refers to lifestyle choices and visits with your health care provider that can promote health and wellness. What does preventive care include?  A yearly physical exam. This is also called an annual well check.  Dental exams once or twice a year.  Routine eye exams. Ask your health care provider how often you should have your eyes checked.  Personal lifestyle choices, including:  Daily care of your teeth and gums.  Regular physical activity.  Eating a healthy diet.  Avoiding tobacco and drug use.  Limiting alcohol use.  Practicing safe sex.  Taking low-dose aspirin every day.  Taking vitamin and mineral supplements as recommended by your health care provider. What happens during an annual well check? The services and screenings done by your health care provider during your annual well check will depend on your age, overall health, lifestyle risk factors, and  family history of disease. Counseling  Your health care provider may ask you questions about your:  Alcohol use.  Tobacco use.  Drug use.  Emotional well-being.  Home and relationship well-being.  Sexual activity.  Eating habits.  History of falls.  Memory and ability to understand (cognition).  Work and work Statistician.  Reproductive health. Screening  You may have the following tests or measurements:  Height, weight, and BMI.  Blood pressure.  Lipid and cholesterol levels. These may be checked every 5 years, or more frequently if you are over 3 years old.  Skin check.  Lung cancer screening. You may have this screening every year starting at age 26 if you have a 30-pack-year history of smoking and currently smoke or have quit within the past 15 years.  Fecal occult blood test (FOBT) of the stool. You may have this test every year starting at age 73.  Flexible sigmoidoscopy or colonoscopy. You may have a sigmoidoscopy every 5 years or a colonoscopy every 10 years starting at age 48.  Hepatitis C blood test.  Hepatitis B blood test.  Sexually transmitted disease (STD) testing.  Diabetes screening. This is done by checking your blood sugar (glucose) after you have not eaten for a while (fasting). You may have this done every 1-3 years.  Bone density scan. This is done to screen for osteoporosis. You may have this done starting at age 41.  Mammogram. This may be done every 1-2 years. Talk to your health care provider about how often you should have regular mammograms. Talk with your health care provider about your test results, treatment options, and if necessary, the need for  more tests. Vaccines  Your health care provider may recommend certain vaccines, such as:  Influenza vaccine. This is recommended every year.  Tetanus, diphtheria, and acellular pertussis (Tdap, Td) vaccine. You may need a Td booster every 10 years.  Zoster vaccine. You may need this  after age 71.  Pneumococcal 13-valent conjugate (PCV13) vaccine. One dose is recommended after age 103.  Pneumococcal polysaccharide (PPSV23) vaccine. One dose is recommended after age 22. Talk to your health care provider about which screenings and vaccines you need and how often you need them. This information is not intended to replace advice given to you by your health care provider. Make sure you discuss any questions you have with your health care provider. Document Released: 10/11/2015 Document Revised: 06/03/2016 Document Reviewed: 07/16/2015 Elsevier Interactive Patient Education  2017 [place] Prevention in the Home Falls can cause injuries. They can happen to people of all ages. There are many things you can do to make your home safe and to help prevent falls. What can I do on the outside of my home?  Regularly fix the edges of walkways and driveways and fix any cracks.  Remove anything that might make you trip as you walk through a door, such as a raised step or threshold.  Trim any bushes or trees on the path to your home.  Use bright outdoor lighting.  Clear any walking paths of anything that might make someone trip, such as rocks or tools.  Regularly check to see if handrails are loose or broken. Make sure that both sides of any steps have handrails.  Any raised decks and porches should have guardrails on the edges.  Have any leaves, snow, or ice cleared regularly.  Use sand or salt on walking paths during winter.  Clean up any spills in your garage right away. This includes oil or grease spills. What can I do in the bathroom?  Use night lights.  Install grab bars by the toilet and in the tub and shower. Do not use towel bars as grab bars.  Use non-skid mats or decals in the tub or shower.  If you need to sit down in the shower, use a plastic, non-slip stool.  Keep the floor dry. Clean up any water that spills on the floor as soon as it happens.   Remove soap buildup in the tub or shower regularly.  Attach bath mats securely with double-sided non-slip rug tape.  Do not have throw rugs and other things on the floor that can make you trip. What can I do in the bedroom?  Use night lights.  Make sure that you have a light by your bed that is easy to reach.  Do not use any sheets or blankets that are too big for your bed. They should not hang down onto the floor.  Have a firm chair that has side arms. You can use this for support while you get dressed.  Do not have throw rugs and other things on the floor that can make you trip. What can I do in the kitchen?  Clean up any spills right away.  Avoid walking on wet floors.  Keep items that you use a lot in easy-to-reach places.  If you need to reach something above you, use a strong step stool that has a grab bar.  Keep electrical cords out of the way.  Do not use floor polish or wax that makes floors slippery. If you must use wax, use non-skid  floor wax.  Do not have throw rugs and other things on the floor that can make you trip. What can I do with my stairs?  Do not leave any items on the stairs.  Make sure that there are handrails on both sides of the stairs and use them. Fix handrails that are broken or loose. Make sure that handrails are as long as the stairways.  Check any carpeting to make sure that it is firmly attached to the stairs. Fix any carpet that is loose or worn.  Avoid having throw rugs at the top or bottom of the stairs. If you do have throw rugs, attach them to the floor with carpet tape.  Make sure that you have a light switch at the top of the stairs and the bottom of the stairs. If you do not have them, ask someone to add them for you. What else can I do to help prevent falls?  Wear shoes that:  Do not have high heels.  Have rubber bottoms.  Are comfortable and fit you well.  Are closed at the toe. Do not wear sandals.  If you use a  stepladder:  Make sure that it is fully opened. Do not climb a closed stepladder.  Make sure that both sides of the stepladder are locked into place.  Ask someone to hold it for you, if possible.  Clearly mark and make sure that you can see:  Any grab bars or handrails.  First and last steps.  Where the edge of each step is.  Use tools that help you move around (mobility aids) if they are needed. These include:  Canes.  Walkers.  Scooters.  Crutches.  Turn on the lights when you go into a dark area. Replace any light bulbs as soon as they burn out.  Set up your furniture so you have a clear path. Avoid moving your furniture around.  If any of your floors are uneven, fix them.  If there are any pets around you, be aware of where they are.  Review your medicines with your doctor. Some medicines can make you feel dizzy. This can increase your chance of falling. Ask your doctor what other things that you can do to help prevent falls. This information is not intended to replace advice given to you by your health care provider. Make sure you discuss any questions you have with your health care provider. Document Released: 07/11/2009 Document Revised: 02/20/2016 Document Reviewed: 10/19/2014 Elsevier Interactive Patient Education  2017 [employer].

## 2019-04-03 NOTE — Progress Notes (Signed)
Subjective:   Annette Miller is a 68 y.o. female who presents for an Initial Medicare Annual Wellness Visit.  This visit is being conducted via phone call  - after an attmept to do on video chat - due to the COVID-19 pandemic. This patient has given me verbal consent via phone to conduct this visit, patient states they are participating from their home address. Some vital signs may be absent or patient reported.   Patient identification: identified by name, DOB, and current address.    Review of Systems      Cardiac Risk Factors include: advanced age (>5255men, 40>65 women);hypertension;dyslipidemia;smoking/ tobacco exposure     Objective:    Today's Vitals   04/03/19 1458  Weight: 104 lb (47.2 kg)  Height: 5\' 2"  (1.575 m)   Body mass index is 19.02 kg/m.  Advanced Directives 04/03/2019 09/08/2018 08/30/2018 08/19/2018 04/06/2018 04/06/2018 02/25/2018  Does Patient Have a Medical Advance Directive? Yes Yes Yes Yes Yes Yes Yes  Type of Advance Directive Living will;Healthcare Power of [employer] Corporation[employer] Healthcare Power of [place];Living will Living will;Healthcare Power of Attorney Living will;Healthcare Power of [employer] Healthcare Power of [place];Living will Healthcare Power of [place];Living will Healthcare Power of [place];Living will  Does patient want to make changes to medical advance directive? - - Yes (MAU/Ambulatory/Procedural Areas - Information given) Yes (MAU/Ambulatory/Procedural Areas - Information given) - No - Patient declined No - Patient declined  Copy of Healthcare Power of Attorney in Chart? Yes - validated most recent copy scanned in chart (See row information) Yes - validated most recent copy scanned in chart (See row information) - - No - copy requested No - copy requested No - copy requested  Would patient like information on creating a medical advance directive? - - - - - - -    Current Medications (verified) Outpatient Encounter Medications as of 04/03/2019   Medication Sig  . acetaminophen (TYLENOL) 325 MG tablet Take 650 mg by mouth every 6 (six) hours as needed for mild pain or fever.  . Ascorbic Acid (VITAMIN C) 1000 MG tablet Take 3,000 mg by mouth daily.  Marland Kitchen. azelastine (ASTELIN) 0.1 % nasal spray Place 2 sprays into both nostrils 2 (two) times daily. Use in each nostril as directed  . buPROPion (WELLBUTRIN SR) 150 MG 12 hr tablet Take 1 tablet (150 mg total) by mouth 2 (two) times daily.  . Calcium Carb-Cholecalciferol (OYSTER SHELL CALCIUM PLUS D PO) Take 1 tablet by mouth daily.   . diazepam (VALIUM) 5 MG tablet Take 1 tablet (5 mg total) by mouth 2 (two) times daily.  . fexofenadine (ALLEGRA) 180 MG tablet TAKE 1 TABLET (180 MG TOTAL) BY MOUTH DAILY. (Patient taking differently: Take 180 mg by mouth daily. )  . gabapentin (NEURONTIN) 300 MG capsule Take 1 capsule (300 mg total) by mouth 3 (three) times daily.  Marland Kitchen. levothyroxine (SYNTHROID, LEVOTHROID) 25 MCG tablet Take 1 tablet (25 mcg total) by mouth daily before breakfast.  . magnesium 30 MG tablet Take 30 mg by mouth daily.   . meloxicam (MOBIC) 15 MG tablet TAKE 1 TABLET BY MOUTH EVERY DAY  . Multiple Vitamin (MULTIVITAMIN WITH MINERALS) TABS tablet Take 1 tablet by mouth daily.  . ondansetron (ZOFRAN) 4 MG tablet TAKE 1 TABLET BY MOUTH EVERY 8 HOURS AS NEEDED FOR NAUSEA AND VOMITING  . pantoprazole (PROTONIX) 40 MG tablet Take 1 tablet (40 mg total) by mouth 2 (two) times daily.  . rosuvastatin (CRESTOR) 10 MG tablet Take 1 tablet (  10 mg total) by mouth daily.  . Selenium 200 MCG CAPS Take 200 mcg by mouth daily.   . vitamin E 1000 UNIT capsule Take 1,000 Units by mouth 2 (two) times daily.  . Zinc Sulfate (ZINC 15 PO) Take 15 mg by mouth daily.   Marland Kitchen triamcinolone cream (KENALOG) 0.1 % Apply 1 application topically 2 (two) times daily. (Patient not taking: Reported on 04/03/2019)  . [DISCONTINUED] amoxicillin-clavulanate (AUGMENTIN) 875-125 MG tablet Take 1 tablet by mouth 2 (two) times  daily. (Patient not taking: Reported on 04/03/2019)   No facility-administered encounter medications on file as of 04/03/2019.     Allergies (verified) Aspirin, Prednisone, Codeine, and Tape   History: Past Medical History:  Diagnosis Date  . Allergy   . Anemia    improved since surgery in May 2019  . Anxiety   . Arthritis   . Depression   . GERD (gastroesophageal reflux disease) 2019   PUD, history of diverticulitis  . Goiter   . H/O cardiac arrest 03/2018   s/p surgery and while in hospital. no cardiac history  . Headache   . History of blood transfusion 01/2018  . Hyperlipidemia   . Hypothyroidism   . Osteoporosis    Past Surgical History:  Procedure Laterality Date  . ABDOMINAL HYSTERECTOMY  1979  . APPENDECTOMY  01/2018  . COLONOSCOPY WITH PROPOFOL N/A 01/14/2018   Procedure: COLONOSCOPY WITH PROPOFOL;  Surgeon: Toney Reil, MD;  Location: [facility] ENDOSCOPY;  Service: Gastroenterology;  Laterality: N/A;  . COLONOSCOPY WITH PROPOFOL N/A 06/24/2018   Procedure: COLONOSCOPY WITH PROPOFOL;  Surgeon: [name], MD;  Location[facility] ENDOSCOPY;  Service: Gastroenterology;  Laterality: N/A;  . COLOSTOMY TAKEDOWN N/A 08/30/2018   Procedure: COLOSTOMY TAKEDOWN;  Surgeon: Leafy Ro, MD;  Location: [facility] ORS;  Service: General;  Laterality: N/A;  . ESOPHAGOGASTRODUODENOSCOPY N/A 04/06/2018   Procedure: ESOPHAGOGASTRODUODENOSCOPY (EGD);  Surgeon: Toney Reil, MD;  Location: [facility] ENDOSCOPY;  Service: Gastroenterology;  Laterality: N/A;  . ESOPHAGOGASTRODUODENOSCOPY (EGD) WITH PROPOFOL N/A 01/14/2018   Procedure: ESOPHAGOGASTRODUODENOSCOPY (EGD) WITH PROPOFOL;  Surgeon: Toney Reil, MD;  Location: [facility] ENDOSCOPY;  Service: Gastroenterology;  Laterality: N/A;  . ESOPHAGOGASTRODUODENOSCOPY (EGD) WITH PROPOFOL N/A 06/24/2018   Procedure: ESOPHAGOGASTRODUODENOSCOPY (EGD) WITH PROPOFOL;  Surgeon: [name], MD;  Location: [facility] ENDOSCOPY;  Service: Gastroenterology;   Laterality: N/A;  . LAPAROTOMY N/A 02/25/2018   Procedure: EXPLORATORY LAPAROTOMY, PARTMAN'S PROCEDURE, APPENDECTOMY, COLOSTOMY;  Surgeon: Star Age, DO;  Location: [facility] ORS;  Service: General;  Laterality: N/A;  . TONSILLECTOMY  1976  . VENTRAL HERNIA REPAIR N/A 08/30/2018   Procedure: HERNIA REPAIR VENTRAL ADULT;  Surgeon: Leafy Ro, MD;  Location: [facility] ORS;  Service: General;  Laterality: N/A;  . VISCERAL ANGIOGRAPHY N/A 04/07/2018   Procedure: VISCERAL ANGIOGRAPHY;  Surgeon: Annice Needy, MD;  Location: [facility] INVASIVE CV LAB;  Service: Cardiovascular;  Laterality: N/A;   Family History  Problem Relation Age of Onset  . Cancer Mother   . AAA (abdominal aortic aneurysm) Father   . Cancer Maternal Grandmother   . Stroke Maternal Grandmother   . Cancer Paternal Grandfather    Social History   Socioeconomic History  . Marital status: Widowed    Spouse name: Not on file  . Number of children: Not on file  . Years of education: Not on file  . Highest education level: Associate degree: academic program  Occupational History    Comment: retired  . Occupation: Journalist, newspaper  Comment: only on weekends   Social Needs  . Financial resource strain: Somewhat hard  . Food insecurity    Worry: Never true    Inability: Never true  . Transportation needs    Medical: No    Non-medical: No  Tobacco Use  . Smoking status: Current Some Day Smoker    Packs/day: 0.10    Types: Cigarettes    Last attempt to quit: 07/20/2018    Years since quitting: 0.7  . Smokeless tobacco: Never Used  . Tobacco comment: smokes occasionally   Substance and Sexual Activity  . Alcohol use: No  . Drug use: No  . Sexual activity: Not on file  Lifestyle  . Physical activity    Days per week: 0 days    Minutes per session: 0 min  . Stress: Not at all  Relationships  . Social connections    Talks on phone: More than three times a week    Gets together: More than three times a  week    Attends religious service: Never    Active member of club or organization: No    Attends meetings of clubs or organizations: Never    Relationship status: Widowed  Other Topics Concern  . 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, historic buildings     Tobacco Counseling Ready to quit: Yes Counseling given: Yes Comment: smokes occasionally    Clinical Intake:  Pre-visit preparation completed: Yes  Pain : No/denies pain     Nutritional Risks: None Diabetes: No  How often do you need to have someone help you when you read instructions, pamphlets, or other written materials from your doctor or pharmacy?: 1 - Never What is the last grade level you completed in school?: associates  Interpreter Needed?: No  Information entered by ::  ,LPN   Activities of Daily Living In your present state of health, do you have any difficulty performing the following activities: 04/03/2019 08/30/2018  Hearing? N N  Vision? N N  Difficulty concentrating or making decisions? Y N  Comment takes a minute for things to come back -  Walking or climbing stairs? N Y  Comment some vertigo since fall from last year -  Dressing or bathing? N Y  Doing errands, shopping? N N  Preparing Food and eating ? N -  Using the Toilet? N -  In the past six months, have you accidently leaked urine? N -  Do you have problems with loss of bowel control? N -  Managing your Medications? N -  Managing your Finances? N -  Housekeeping or managing your Housekeeping? N -  Some recent data might be hidden     Immunizations and Health Maintenance Immunization History  Administered Date(s) Administered  . Influenza, High Dose Seasonal PF 06/10/2018  . Influenza-Unspecified 06/12/2015  . Pneumococcal Conjugate-13 05/12/2016  . Td 02/10/2008  . Zoster 04/21/2012   Health Maintenance Due  Topic Date Due  . MAMMOGRAM  04/10/2001  . DEXA SCAN  04/10/2016  .  PNA vac Low Risk Adult (2 of 2 - PPSV23) 05/12/2017  . TETANUS/TDAP  02/09/2018    Patient Care Team: Guadalupe Maple, MD as PCP - General (Family Medicine)  Indicate any recent Medical Services you may have received from other than [facility] providers in the past year (date may be approximate).     Assessment:   This is a routine wellness examination for Catalina.  Hearing/Vision screen No exam data present  Dietary issues and exercise activities discussed: Current Exercise Habits: The patient has a physically strenuous job, but has no regular exercise apart from work., Exercise limited by: None identified  Goals   None    Depression Screen PHQ 2/9 Scores 04/03/2019 06/30/2018 12/15/2017 05/20/2017 01/27/2017 05/12/2016  PHQ - 2 Score 0 4 0 0 0 1  PHQ- 9 Score - 11 - - - -    Fall Risk Fall Risk  04/03/2019 10/10/2018 09/12/2018 09/12/2018 08/10/2018  Falls in the past year? 0 1 1 0 0  Number falls in past yr: 1 0 0 - -  Injury with Fall? 1 1 1  - -  Risk Factor Category  - - - - -  Risk for fall due to : History of fall(s) History of fall(s) - - -  Follow up - Falls evaluation completed - - -   FALL RISK PREVENTION PERTAINING TO THE HOME:  Any stairs in or around the home? Yes  If so, are there any without handrails? No   Home free of loose throw rugs in walkways, pet beds, electrical cords, etc? Yes  Adequate lighting in your home to reduce risk of falls? Yes   ASSISTIVE DEVICES UTILIZED TO PREVENT FALLS:  Life alert? No  Use of a cane, walker or w/c? No  Grab bars in the bathroom? No  Shower chair or bench in shower? No  Elevated toilet seat or a handicapped toilet? No    DME ORDERS:  DME order needed?  No   TIMED UP AND GO:  Unable to perform     Cognitive Function:     6CIT Screen 04/03/2019  What Year? 0 points  What month? 0 points  What time? 0 points  Count back from 20 0 points  Months in reverse 0 points  Repeat phrase 0 points  Total Score 0     Screening Tests Health Maintenance  Topic Date Due  . MAMMOGRAM  04/10/2001  . DEXA SCAN  04/10/2016  . PNA vac Low Risk Adult (2 of 2 - PPSV23) 05/12/2017  . TETANUS/TDAP  02/09/2018  . COLONOSCOPY  06/24/2028  . Hepatitis C Screening  Completed  . INFLUENZA VACCINE  Discontinued    Qualifies for Shingles Vaccine? Yes  Zostavax completed 04/21/2012. Due for Shingrix. Education has been provided regarding the importance of this vaccine. Pt has been advised to call insurance company to determine out of pocket expense. Advised may also receive vaccine at local pharmacy or Health Dept. Verbalized acceptance and understanding.  Tdap: Discussed need for TD/TDAP vaccine, patient verbalized understanding that this is not covered as a preventative with there insurance and to call the office if she develops any new skin injuries, ie: cuts, scrapes, bug bites, or open wounds.  Flu Vaccine: up to date  Pneumococcal Vaccine: will get at next in office visit   Cancer Screenings:  Colorectal Screening: Completed 06/24/2018. Repeat in 4 months, postponed due to covid-19  Mammogram: ordered.   Bone Density: ordered  Lung Cancer Screening: (Low Dose CT Chest recommended if Age 41-80 years, 30 pack-year currently smoking OR have quit w/in 15years.) does not qualify.   Additional Screening:  Hepatitis C Screening: does qualify; Completed 05/20/2017  Vision Screening: Recommended annual ophthalmology exams for early detection of glaucoma and other disorders of the eye. Is the patient up to date with their annual eye exam?  Yes     Dental Screening: Recommended annual dental exams  for proper oral hygiene  Community Resource Referral:  CRR required this visit?  No       Plan:  I have personally reviewed and addressed the Medicare Annual Wellness questionnaire and have noted the following in the patient's chart:  A. Medical and social history B. Use of alcohol, tobacco or illicit drugs  C.  Current medications and supplements D. Functional ability and status E.  Nutritional status F.  Physical activity G. Advance directives H. List of other physicians I.  Hospitalizations, surgeries, and ER visits in previous 12 months J.  Vitals K. Screenings such as hearing and vision if needed, cognitive and depression L. Referrals and appointments   In addition, I have reviewed and discussed with patient certain preventive protocols, quality metrics, and best practice recommendations. A written personalized care plan for preventive services as well as general preventive health recommendations were provided to patient.   Signed,    Collene SchlichterHill,  A, LPN   1/6/10967/02/2019  Nurse Health Advisor   Nurse Notes: none

## 2019-04-17 ENCOUNTER — Encounter: Payer: Self-pay | Admitting: Surgery

## 2019-04-19 ENCOUNTER — Other Ambulatory Visit: Payer: Self-pay | Admitting: Family Medicine

## 2019-04-21 ENCOUNTER — Other Ambulatory Visit: Payer: Medicare HMO

## 2019-04-21 ENCOUNTER — Other Ambulatory Visit: Payer: Self-pay | Admitting: Family Medicine

## 2019-05-08 ENCOUNTER — Ambulatory Visit: Payer: Medicare HMO | Admitting: Gastroenterology

## 2019-05-17 ENCOUNTER — Other Ambulatory Visit: Payer: Self-pay

## 2019-05-17 ENCOUNTER — Telehealth: Payer: Self-pay

## 2019-05-17 ENCOUNTER — Ambulatory Visit (INDEPENDENT_AMBULATORY_CARE_PROVIDER_SITE_OTHER): Payer: Medicare HMO | Admitting: Gastroenterology

## 2019-05-17 DIAGNOSIS — Z1211 Encounter for screening for malignant neoplasm of colon: Secondary | ICD-10-CM

## 2019-05-17 DIAGNOSIS — K279 Peptic ulcer, site unspecified, unspecified as acute or chronic, without hemorrhage or perforation: Secondary | ICD-10-CM | POA: Diagnosis not present

## 2019-05-17 MED ORDER — NA SULFATE-K SULFATE-MG SULF 17.5-3.13-1.6 GM/177ML PO SOLN: 1.0000 | Freq: Once | ORAL | 0 refills | Status: AC

## 2019-05-17 MED ORDER — ONDANSETRON HCL 4 MG PO TABS: ORAL_TABLET | ORAL | 3 refills | Status: DC

## 2019-05-17 NOTE — Telephone Encounter (Signed)
Called pt to pre-chart for today's e-visit with Dr. Anna  Unable to contact. LVM to return call 

## 2019-05-17 NOTE — Progress Notes (Signed)
Annette Miller , MD [address]  [address]  [place], [place] [id number]  Main: [phone]  Fax: [phone]   Primary Care Physician: Steele Sizerrissman, Mark A, MD  Virtual Visit via Telephone Note  I connected with patient on 05/17/19 at 10:00 AM EDT by telephone and verified that I am speaking with the correct person using two identifiers.   I discussed the limitations, risks, security and privacy concerns of performing an evaluation and management service by telephone and the availability of in person appointments. I also discussed with the patient that there may be a patient responsible charge related to this service. The patient expressed understanding and agreed to proceed.  Location of Patient: Home Location of Provider: Home Persons involved: Patient and provider only   History of Present Illness: No chief complaint on file.   HPI: Annette Miller is a 68 y.o. female   Summary of history :   She was initially seen in my office in 06/17/2018 after she transferred care from Dr. Allegra LaiVanga.  She was initially seen in 12/2017 for peptic ulcer disease. She went to the ER in 11/2017 with abdominal pain and found to have on /ct scan a gastric ulcer. Treated by Dr Dossie Arbourrissman for H pylori with triple therapy , no test was done for H pylori before treatment. Had been on long term Asprin 325 mg a day .   Colonoscopy 12/2017 was a poor prep, diverticulosis seen . No polyps.  EGD 01/14/18 : Large duodenal ulcer seen , treated with heat therapy.   Admitted in 03/2018 with melena and a duodenal ulcer with black pigmented spot not bleeding seen . Treated with PPI. Subsequently had a re bleed , Code blue event - underwent embolization of the gastrodudenal artery . Did well after and discharged.   She underwent hartmans procedure for perforate I&D community 0 palpate d diverticulitis on 02/25/18 and sees Dr Everlene FarrierPabon. 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 Was on asprin and now no on it either.    Interval history   06/17/2018-05/17/2019 06/10/2018: H. pylori breath test: Negative 06/24/2018: Colonoscopy: Poor prep but grossly normal and was okay for revision of colostomy. 06/24/2018: EGD: Normal  Doing well , occasional nausea, had reversal of colostomy and did well. No new issues   On Protonix   Current Outpatient Medications  Medication Sig Dispense Refill  . acetaminophen (TYLENOL) 325 MG tablet Take 650 mg by mouth every 6 (six) hours as needed for mild pain or fever.    . Ascorbic Acid (VITAMIN C) 1000 MG tablet Take 3,000 mg by mouth daily.    Marland Kitchen. azelastine (ASTELIN) 0.1 % nasal spray Place 2 sprays into both nostrils 2 (two) times daily. Use in each nostril as directed 30 mL 12  . buPROPion (WELLBUTRIN SR) 150 MG 12 hr tablet Take 1 tablet (150 mg total) by mouth 2 (two) times daily. 180 tablet 2  . Calcium Carb-Cholecalciferol (OYSTER SHELL CALCIUM PLUS D PO) Take 1 tablet by mouth daily.     . diazepam (VALIUM) 5 MG tablet Take 1 tablet (5 mg total) by mouth 2 (two) times daily. 60 tablet 3  . fexofenadine (ALLEGRA) 180 MG tablet TAKE 1 TABLET (180 MG TOTAL) BY MOUTH DAILY. 90 tablet 2  . gabapentin (NEURONTIN) 300 MG capsule Take 1 capsule (300 mg total) by mouth 3 (three) times daily. 270 capsule 3  .  levothyroxine (SYNTHROID, LEVOTHROID) 25 MCG tablet Take 1 tablet (25 mcg total) by mouth daily before breakfast. 90 tablet 2  . magnesium 30 MG tablet Take 30 mg by mouth daily.     . meloxicam (MOBIC) 15 MG tablet TAKE 1 TABLET BY MOUTH EVERY DAY 90 tablet 0  . Multiple Vitamin (MULTIVITAMIN WITH MINERALS) TABS tablet Take 1 tablet by mouth daily.    . ondansetron (ZOFRAN) 4 MG tablet TAKE 1 TABLET BY MOUTH EVERY 8 HOURS AS NEEDED FOR NAUSEA AND VOMITING 20 tablet 3  . pantoprazole (PROTONIX) 40 MG tablet Take 1 tablet (40 mg total) by mouth 2 (two) times daily. 180 tablet 1  . rosuvastatin (CRESTOR) 10 MG  tablet Take 1 tablet (10 mg total) by mouth daily. 90 tablet 2  . Selenium 200 MCG CAPS Take 200 mcg by mouth daily.     Marland Kitchen. triamcinolone cream (KENALOG) 0.1 % Apply 1 application topically 2 (two) times daily. (Patient not taking: Reported on 04/03/2019) 30 g 0  . vitamin E 1000 UNIT capsule Take 1,000 Units by mouth 2 (two) times daily.    . Zinc Sulfate (ZINC 15 PO) Take 15 mg by mouth daily.      No current facility-administered medications for this visit.     Allergies as of 05/17/2019 - Review Complete 04/03/2019  Allergen Reaction Noted  . Aspirin  08/19/2018  . Prednisone Other (See Comments) 12/09/2017  . Codeine Diarrhea and Nausea And Vomiting 04/30/2015  . Tape Rash 04/08/2018    Review of Systems:    All systems reviewed and negative except where noted in HPI.   Observations/Objective:  Labs: CMP     Component Value Date/Time   NA 142 10/10/2018 1427   K 4.0 10/10/2018 1427   CL 99 10/10/2018 1427   CO2 25 10/10/2018 1427   GLUCOSE 83 10/10/2018 1427   GLUCOSE 126 (H) 09/08/2018 1607   BUN 18 10/10/2018 1427   CREATININE 0.87 10/10/2018 1427   CALCIUM 9.8 10/10/2018 1427   PROT 7.4 10/10/2018 1427   ALBUMIN 3.7 10/10/2018 1427   AST 9 10/10/2018 1427   AST 19 11/12/2016 0000   ALT 8 10/10/2018 1427   ALT 15 11/12/2016 0000   ALKPHOS 111 10/10/2018 1427   BILITOT <0.2 10/10/2018 1427   GFRNONAA 69 10/10/2018 1427   GFRAA 80 10/10/2018 1427   Lab Results  Component Value Date   WBC 8.6 10/10/2018   HGB 9.4 (L) 10/10/2018   HCT 28.1 (L) 10/10/2018   MCV 99 (H) 10/10/2018   PLT 654 (H) 10/10/2018    Imaging Studies: No results found.  Assessment and Plan:   Annette Mattocksamela S Luger is a 68 y.o. y/o female here today to see for transfer of care. H/o duodenal ulcer that bled x 2, perforated divereticulitis of the colon , s/p hartmans procedure and reversal . Doing well    Plan  1. Repeat colonoscopy with 2 day prep - failed twice to get adequate prep to  screen for polyps 2. Refill on zofran  I have discussed alternative options, risks & benefits,  which include, but are not limited to, bleeding, infection, perforation,respiratory complication & drug reaction.  The patient agrees with this plan & written consent will be obtained.      I discussed the assessment and treatment plan with the patient. The patient was provided an opportunity to ask questions and all were answered. The patient agreed with the plan and demonstrated an understanding of the  instructions.   The patient was advised to call back or seek an in-person evaluation if the symptoms worsen or if the condition fails to improve as anticipated.  I provided 12 minutes of non-face-to-face time during this encounter.  Dr Jonathon Bellows MD,MRCP [facility]) Gastroenterology/Hepatology Pager: [phone]   Speech recognition software was used to dictate this note.

## 2019-05-19 ENCOUNTER — Encounter
Admission: RE | Admit: 2019-05-19 | Discharge: 2019-05-19 | Disposition: A | Discharge status: Home / Self Care | Payer: Medicare HMO | Source: Ambulatory Visit | Attending: Gastroenterology | Admitting: Gastroenterology

## 2019-05-19 ENCOUNTER — Other Ambulatory Visit: Payer: Self-pay

## 2019-05-19 DIAGNOSIS — Z01812 Encounter for preprocedural laboratory examination: Secondary | ICD-10-CM | POA: Insufficient documentation

## 2019-05-19 DIAGNOSIS — Z20828 Contact with and (suspected) exposure to other viral communicable diseases: Secondary | ICD-10-CM | POA: Diagnosis not present

## 2019-05-19 DIAGNOSIS — R69 Illness, unspecified: Secondary | ICD-10-CM | POA: Diagnosis not present

## 2019-05-19 LAB — SARS CORONAVIRUS 2 (TAT 6-24 HRS): SARS Coronavirus 2: NEGATIVE

## 2019-05-22 ENCOUNTER — Telehealth: Payer: Self-pay | Admitting: Gastroenterology

## 2019-05-22 ENCOUNTER — Other Ambulatory Visit: Payer: Self-pay | Admitting: Family Medicine

## 2019-05-22 NOTE — Telephone Encounter (Signed)
Pt left vm she needs her instructions e-mailed to email   [email or web address] her procedure is 05/24/19

## 2019-05-22 NOTE — Telephone Encounter (Signed)
Requested medication (s) are due for refill today: no  Requested medication (s) are on the active medication list: no  Last refill:  10/10/2018  Future visit scheduled: no  Notes to clinic:  The original prescription was discontinued on 10/10/2018 by Guadalupe Maple, MD. Renewing this prescription may not be appropriate.   Requested Prescriptions  Pending Prescriptions Disp Refills   gabapentin (NEURONTIN) 600 MG tablet [Pharmacy Med Name: GABAPENTIN 600 MG TABLET] 45 tablet 1    Sig: TAKE 0.5 TABLETS (300MG  MG TOTAL) BY MOUTH 3 (THREE) TIMES DAILY.     There is no refill protocol information for this order

## 2019-05-22 NOTE — Telephone Encounter (Signed)
Called pt to verify that she received her procedure instructions.  Unable to contact, LVM to return call

## 2019-05-23 ENCOUNTER — Telehealth: Payer: Self-pay | Admitting: Gastroenterology

## 2019-05-23 NOTE — Telephone Encounter (Signed)
Patient called & l/m on v/m stating she did not receive colonoscopy instructions & is scheduled 05-24-19. Her email address is [email or web address]   Please call @ [phone] to go over instructions.

## 2019-05-23 NOTE — Telephone Encounter (Signed)
Spoke with pt regarding the procedure instructions pt states she still has not received the prep instructions but has followed the 2-day prep as verbally instructed. I reminded pt to continue the clear liquid diet all day today and begin drinking the first dose of the bowel prep at 5 pm today and the second dose at 5 hours before procedure. Pt also has the number to [facility] End[facility] to call for her arrival time. Pt understands.

## 2019-05-24 ENCOUNTER — Ambulatory Visit: Payer: Medicare HMO | Admitting: Anesthesiology

## 2019-05-24 ENCOUNTER — Ambulatory Visit
Admission: RE | Admit: 2019-05-24 | Discharge: 2019-05-24 | Disposition: A | Discharge status: Home / Self Care | Payer: Medicare HMO | Attending: Gastroenterology | Admitting: Gastroenterology

## 2019-05-24 ENCOUNTER — Other Ambulatory Visit: Payer: Self-pay

## 2019-05-24 ENCOUNTER — Encounter
Admission: RE | Disposition: A | Discharge status: Home / Self Care | Payer: Self-pay | Source: Home / Self Care | Attending: Gastroenterology

## 2019-05-24 ENCOUNTER — Encounter: Payer: Self-pay | Admitting: *Deleted

## 2019-05-24 DIAGNOSIS — E039 Hypothyroidism, unspecified: Secondary | ICD-10-CM | POA: Diagnosis not present

## 2019-05-24 DIAGNOSIS — K913 Postprocedural intestinal obstruction, unspecified as to partial versus complete: Secondary | ICD-10-CM

## 2019-05-24 DIAGNOSIS — Z09 Encounter for follow-up examination after completed treatment for conditions other than malignant neoplasm: Secondary | ICD-10-CM | POA: Diagnosis present

## 2019-05-24 DIAGNOSIS — Z791 Long term (current) use of non-steroidal anti-inflammatories (NSAID): Secondary | ICD-10-CM | POA: Insufficient documentation

## 2019-05-24 DIAGNOSIS — Z79899 Other long term (current) drug therapy: Secondary | ICD-10-CM | POA: Diagnosis not present

## 2019-05-24 DIAGNOSIS — F329 Major depressive disorder, single episode, unspecified: Secondary | ICD-10-CM | POA: Insufficient documentation

## 2019-05-24 DIAGNOSIS — F419 Anxiety disorder, unspecified: Secondary | ICD-10-CM | POA: Diagnosis not present

## 2019-05-24 DIAGNOSIS — Z980 Intestinal bypass and anastomosis status: Secondary | ICD-10-CM | POA: Diagnosis not present

## 2019-05-24 DIAGNOSIS — Z8719 Personal history of other diseases of the digestive system: Secondary | ICD-10-CM | POA: Insufficient documentation

## 2019-05-24 DIAGNOSIS — K219 Gastro-esophageal reflux disease without esophagitis: Secondary | ICD-10-CM | POA: Diagnosis not present

## 2019-05-24 DIAGNOSIS — Z79890 Hormone replacement therapy (postmenopausal): Secondary | ICD-10-CM | POA: Diagnosis not present

## 2019-05-24 DIAGNOSIS — F17210 Nicotine dependence, cigarettes, uncomplicated: Secondary | ICD-10-CM | POA: Insufficient documentation

## 2019-05-24 DIAGNOSIS — Z1211 Encounter for screening for malignant neoplasm of colon: Secondary | ICD-10-CM | POA: Diagnosis not present

## 2019-05-24 DIAGNOSIS — E785 Hyperlipidemia, unspecified: Secondary | ICD-10-CM | POA: Insufficient documentation

## 2019-05-24 DIAGNOSIS — R69 Illness, unspecified: Secondary | ICD-10-CM | POA: Diagnosis not present

## 2019-05-24 DIAGNOSIS — K56609 Unspecified intestinal obstruction, unspecified as to partial versus complete obstruction: Secondary | ICD-10-CM | POA: Diagnosis not present

## 2019-05-24 HISTORY — PX: COLONOSCOPY WITH PROPOFOL: SHX5780

## 2019-05-24 SURGERY — COLONOSCOPY WITH PROPOFOL
Anesthesia: General

## 2019-05-24 MED ORDER — SODIUM CHLORIDE 0.9 % IV SOLN
INTRAVENOUS | Status: DC
  Administered 2019-05-24: 14:00:00 via INTRAVENOUS

## 2019-05-24 MED ORDER — PROPOFOL 10 MG/ML IV BOLUS
INTRAVENOUS | Status: DC | PRN
  Administered 2019-05-24: 70 mg via INTRAVENOUS

## 2019-05-24 MED ORDER — PROPOFOL 500 MG/50ML IV EMUL
INTRAVENOUS | Status: DC | PRN
  Administered 2019-05-24: 140 ug/kg/min via INTRAVENOUS

## 2019-05-24 NOTE — Anesthesia Preprocedure Evaluation (Signed)
Anesthesia Evaluation  Patient identified by MRN, date of birth, ID band Patient awake    Reviewed: Allergy & Precautions, NPO status , Patient's Chart, lab work & pertinent test results  History of Anesthesia Complications Negative for: history of anesthetic complications  Airway Mallampati: II  TM Distance: >3 FB Neck ROM: Full    Dental no notable dental hx.    Pulmonary neg sleep apnea, neg COPD, Current SmokerPatient did not abstain from smoking.,    breath sounds clear to auscultation- rhonchi (-) wheezing      Cardiovascular Exercise Tolerance: Good (-) hypertension(-) CAD, (-) Past MI, (-) Cardiac Stents and (-) CABG  Rhythm:Regular Rate:Normal - Systolic murmurs and - Diastolic murmurs    Neuro/Psych  Headaches, neg Seizures PSYCHIATRIC DISORDERS Anxiety Depression    GI/Hepatic Neg liver ROS, GERD  ,  Endo/Other  neg diabetesHypothyroidism   Renal/GU Renal disease: hx of nephrolithiasis.     Musculoskeletal  (+) Arthritis ,   Abdominal (+) - obese,   Peds  Hematology  (+) anemia ,   Anesthesia Other Findings Past Medical History: No date: Allergy No date: Anemia     Comment:  improved since surgery in May 2019 No date: Anxiety No date: Arthritis No date: Depression 2019: GERD (gastroesophageal reflux disease)     Comment:  PUD, history of diverticulitis No date: Goiter 03/2018: H/O cardiac arrest     Comment:  s/p surgery and while in hospital. no cardiac history No date: Headache 01/2018: History of blood transfusion No date: Hyperlipidemia No date: Hypothyroidism No date: Osteoporosis   Reproductive/Obstetrics                             Anesthesia Physical Anesthesia Plan  ASA: II  Anesthesia Plan: General   Post-op Pain Management:    Induction: Intravenous  PONV Risk Score and Plan: 1 and Propofol infusion  Airway Management Planned: Natural  Airway  Additional Equipment:   Intra-op Plan:   Post-operative Plan:   Informed Consent: I have reviewed the patients History and Physical, chart, labs and discussed the procedure including the risks, benefits and alternatives for the proposed anesthesia with the patient or authorized representative who has indicated his/her understanding and acceptance.     Dental advisory given  Plan Discussed with: CRNA and Anesthesiologist  Anesthesia Plan Comments:         Anesthesia Quick Evaluation

## 2019-05-24 NOTE — Transfer of Care (Signed)
Immediate Anesthesia Transfer of Care Note  Patient: Annette Miller  Procedure(s) Performed: COLONOSCOPY WITH PROPOFOL (N/A )  Patient Location: PACU  Anesthesia Type:General  Level of Consciousness: sedated  Airway & Oxygen Therapy: Patient Spontanous Breathing and Patient connected to nasal cannula oxygen  Post-op Assessment: Report given to RN and Post -op Vital signs reviewed and stable  Post vital signs: Reviewed and stable  Last Vitals:  Vitals Value Taken Time  BP 109/72 05/24/19 1429  Temp 36.5 C 05/24/19 1429  Pulse 92 05/24/19 1429  Resp 25 05/24/19 1429  SpO2 95 % 05/24/19 1429    Last Pain:  Vitals:   05/24/19 1428  TempSrc: Tympanic  PainSc: Asleep      Patients Stated Pain Goal: 0 (27/03/50 0938)  Complications: No apparent anesthesia complications

## 2019-05-24 NOTE — Op Note (Signed)
[facility] Gastroenterology Patient Name: Annette Miller Procedure Date: 05/24/2019 2:00 PM MRN: [id number] Account #: [id number] Date of Birth: Jul 06, 1951 Admit Type: Outpatient Age: 68 Room: [facility] ENDO ROOM 2 Gender: Female Note Status: Finalized Procedure:            Colonoscopy Indications:          High risk colon cancer surveillance: Personal history                        of colonic polyps Providers:            [name] MD, MD Referring MD:         Steele Sizer, MD (Referring MD) Medicines:            Monitored Anesthesia Care Complications:        No immediate complications. Procedure:            Pre-Anesthesia Assessment:                       - Prior to the procedure, a History and Physical was                        performed, and patient medications, allergies and                        sensitivities were reviewed. The patient's tolerance of                        previous anesthesia was reviewed.                       - The risks and benefits of the procedure and the                        sedation options and risks were discussed with the                        patient. All questions were answered and informed                        consent was obtained.                       - ASA Grade Assessment: III - A patient with severe                        systemic disease.                       After obtaining informed consent, the colonoscope was                        passed under direct vision. Throughout the procedure,                        the patient's blood pressure, pulse, and oxygen                        saturations were monitored continuously. The  Colonoscope was introduced through the anus and                        advanced to the the cecum, identified by the                        appendiceal orifice. The colonoscopy was performed with                        ease. The patient tolerated the procedure well. The                         quality of the bowel preparation was good. Findings:      The perianal and digital rectal examinations were normal.      There was evidence of a prior end-to-side colo-colonic anastomosis in       the sigmoid colon. This was patent and was characterized by moderate       stenosis. The anastomosis could not be traversed. The scope was       withdrawn and replaced with the adult endoscope in order to accomplish       the maneuver. A TTS dilator was passed through the scope. Dilation with       a 12-13.5-15 mm balloon dilator was performed to 13.5 mm. The dilation       site was examined and showed moderate mucosal disruption.      The exam was otherwise without abnormality on direct and retroflexion       views. Impression:           - Patent end-to-side colo-colonic anastomosis,                        characterized by moderate stenosis. Dilated.                       - The examination was otherwise normal on direct and                        retroflexion views.                       - No specimens collected. Recommendation:       - Discharge patient to home (with escort).                       - Resume previous diet.                       - Continue present medications.                       - Perform a flexible sigmoidoscopy for retreatment in 4                        weeks.                       - Aim to dilate > 15 mm Procedure Code(s):    --- Professional ---                       [phone], Colonoscopy, flexible; with transendoscopic  balloon dilation Diagnosis Code(s):    --- Professional ---                       Z86.010, Personal history of colonic polyps                       Z98.0, Intestinal bypass and anastomosis status CPT copyright 2019 American Medical Association. All rights reserved. The codes documented in this report are preliminary and upon coder review may  be revised to meet current compliance requirements. Jonathon Bellows,  MD Jonathon Bellows MD, MD 05/24/2019 2:29:05 PM This report has been signed electronically. Number of Addenda: 0 Note Initiated On: 05/24/2019 2:00 PM Scope Withdrawal Time: 0 hours 6 minutes 48 seconds  Total Procedure Duration: 0 hours 19 minutes 10 seconds  Estimated Blood Loss: Estimated blood loss: none.      [facility]

## 2019-05-24 NOTE — Anesthesia Post-op Follow-up Note (Signed)
Anesthesia QCDR form completed.        

## 2019-05-24 NOTE — H&P (Signed)
Jonathon Bellows, MD [address] [place], [place], [place], [address] [address], [place], [place], [place], [address] Phone: [phone]  Fax: [phone]  Primary Care Physician:  Guadalupe Maple, MD   Pre-Procedure History & Physical: HPI:  Annette Miller is a 67 y.o. female is here for an colonoscopy.   Past Medical History:  Diagnosis Date  . Allergy   . Anemia    improved since surgery in May 2019  . Anxiety   . Arthritis   . Depression   . GERD (gastroesophageal reflux disease) 2019   PUD, history of diverticulitis  . Goiter   . H/O cardiac arrest 03/2018   s/p surgery and while in hospital. no cardiac history  . Headache   . History of blood transfusion 01/2018  . Hyperlipidemia   . Hypothyroidism   . Osteoporosis     Past Surgical History:  Procedure Laterality Date  . ABDOMINAL HYSTERECTOMY  1979  . APPENDECTOMY  01/2018  . COLONOSCOPY WITH PROPOFOL N/A 01/14/2018   Procedure: COLONOSCOPY WITH PROPOFOL;  Surgeon: Lin Landsman, MD;  Location: [facility] ENDOSCOPY;  Service: Gastroenterology;  Laterality: N/A;  . COLONOSCOPY WITH PROPOFOL N/A 06/24/2018   Procedure: COLONOSCOPY WITH PROPOFOL;  Surgeon: Jonathon Bellows, MD;  Location: [facility] ENDOSCOPY;  Service: Gastroenterology;  Laterality: N/A;  . COLOSTOMY TAKEDOWN N/A 08/30/2018   Procedure: COLOSTOMY TAKEDOWN;  Surgeon: Jules Husbands, MD;  Location: [facility] ORS;  Service: General;  Laterality: N/A;  . ESOPHAGOGASTRODUODENOSCOPY N/A 04/06/2018   Procedure: ESOPHAGOGASTRODUODENOSCOPY (EGD);  Surgeon: Lin Landsman, MD;  Location: [facility] ENDOSCOPY;  Service: Gastroenterology;  Laterality: N/A;  . ESOPHAGOGASTRODUODENOSCOPY (EGD) WITH PROPOFOL N/A 01/14/2018   Procedure: ESOPHAGOGASTRODUODENOSCOPY (EGD) WITH PROPOFOL;  Surgeon: Lin Landsman, MD;  Location: [facility] ENDOSCOPY;  Service: Gastroenterology;  Laterality: N/A;  . ESOPHAGOGASTRODUODENOSCOPY (EGD) WITH PROPOFOL N/A 06/24/2018   Procedure:  ESOPHAGOGASTRODUODENOSCOPY (EGD) WITH PROPOFOL;  Surgeon: Jonathon Bellows, MD;  Location: [facility] ENDOSCOPY;  Service: Gastroenterology;  Laterality: N/A;  . LAPAROTOMY N/A 02/25/2018   Procedure: EXPLORATORY LAPAROTOMY, PARTMAN'S PROCEDURE, APPENDECTOMY, COLOSTOMY;  Surgeon: Hadley Pen, DO;  Location: [facility] ORS;  Service: General;  Laterality: N/A;  . TONSILLECTOMY  1976  . VENTRAL HERNIA REPAIR N/A 08/30/2018   Procedure: HERNIA REPAIR VENTRAL ADULT;  Surgeon: Jules Husbands, MD;  Location: [facility] ORS;  Service: General;  Laterality: N/A;  . VISCERAL ANGIOGRAPHY N/A 04/07/2018   Procedure: VISCERAL ANGIOGRAPHY;  Surgeon: Algernon Huxley, MD;  Location: [facility] CV LAB;  Service: Cardiovascular;  Laterality: N/A;    Prior to Admission medications   Medication Sig Start Date End Date Taking? Authorizing Provider  acetaminophen (TYLENOL) 325 MG tablet Take 650 mg by mouth every 6 (six) hours as needed for mild pain or fever.   Yes [provider]  Ascorbic Acid (VITAMIN C) 1000 MG tablet Take 3,000 mg by mouth daily.   Yes [provider]  azelastine (ASTELIN) 0.1 % nasal spray Place 2 sprays into both nostrils 2 (two) times daily. Use in each nostril as directed 01/16/19  Yes Crissman, Jeannette How, MD  buPROPion (WELLBUTRIN SR) 150 MG 12 hr tablet Take 1 tablet (150 mg total) by mouth 2 (two) times daily. 01/09/19  Yes Crissman, Jeannette How, MD  Calcium Carb-Cholecalciferol (OYSTER SHELL CALCIUM PLUS D PO) Take 1 tablet by mouth daily.    Yes [provider]  diazepam (VALIUM) 5 MG tablet Take 1 tablet (5 mg total) by mouth 2 (two) times daily. 01/09/19  Yes Crissman, Mark A, MD  fexofenadine (ALLEGRA) 180 MG tablet TAKE 1 TABLET (180 MG TOTAL) BY MOUTH DAILY. 04/21/19  Yes Crissman, Redge GainerMark A, MD  gabapentin (NEURONTIN) 300 MG capsule Take 1 capsule (300 mg total) by mouth 3 (three) times daily. 10/10/18  Yes Crissman, Redge GainerMark A, MD  gabapentin (NEURONTIN) 600 MG tablet TAKE 0.5 TABLETS (300MG   MG TOTAL) BY MOUTH 3 (THREE) TIMES DAILY. 05/24/19  Yes Crissman, Redge GainerMark A, MD  levothyroxine (SYNTHROID, LEVOTHROID) 25 MCG tablet Take 1 tablet (25 mcg total) by mouth daily before breakfast. 08/24/18  Yes Crissman, Redge GainerMark A, MD  magnesium 30 MG tablet Take 30 mg by mouth daily.    Yes [provider]  meloxicam (MOBIC) 15 MG tablet TAKE 1 TABLET BY MOUTH EVERY DAY 04/19/19  Yes Crissman, Redge GainerMark A, MD  Multiple Vitamin (MULTIVITAMIN WITH MINERALS) TABS tablet Take 1 tablet by mouth daily. 04/14/18  Yes Auburn BilberryPatel, Shreyang, MD  ondansetron (ZOFRAN) 4 MG tablet Take 1 tablet as needed for nausea upto 2 times a day 05/17/19  Yes Wyline MoodAnna, Jocelyn Nold, MD  pantoprazole (PROTONIX) 40 MG tablet Take 1 tablet (40 mg total) by mouth 2 (two) times daily. 01/09/19  Yes Crissman, Redge GainerMark A, MD  rosuvastatin (CRESTOR) 10 MG tablet Take 1 tablet (10 mg total) by mouth daily. 08/24/18  Yes Crissman, Redge GainerMark A, MD  Selenium 200 MCG CAPS Take 200 mcg by mouth daily.    Yes [provider]  triamcinolone cream (KENALOG) 0.1 % Apply 1 application topically 2 (two) times daily. 10/10/18  Yes Crissman, Redge GainerMark A, MD  vitamin E 1000 UNIT capsule Take 1,000 Units by mouth 2 (two) times daily.   Yes [provider]  Zinc Sulfate (ZINC 15 PO) Take 15 mg by mouth daily.    Yes [provider]    Allergies as of 05/18/2019 - Review Complete 04/03/2019  Allergen Reaction Noted  . Aspirin  08/19/2018  . Prednisone Other (See Comments) 12/09/2017  . Codeine Diarrhea and Nausea And Vomiting 04/30/2015  . Tape Rash 04/08/2018    Family History  Problem Relation Age of Onset  . Cancer Mother   . AAA (abdominal aortic aneurysm) Father   . Cancer Maternal Grandmother   . Stroke Maternal Grandmother   . Cancer Paternal Grandfather     Social History   Socioeconomic History  . Marital status: Widowed    Spouse name: Not on file  . Number of children: Not on file  . Years of education: Not on file  . Highest  education level: Associate degree: academic program  Occupational History    Comment: retired  . Occupation: Scientist, research (medical)nurse superviosr [employer]     Comment: only on weekends   Social Needs  . Financial resource strain: Somewhat hard  . Food insecurity    Worry: Never true    Inability: Never true  . Transportation needs    Medical: No    Non-medical: No  Tobacco Use  . Smoking status: Current Some Day Smoker    Packs/day: 0.10    Types: Cigarettes    Last attempt to quit: 07/20/2018    Years since quitting: 0.8  . Smokeless tobacco: Never Used  . Tobacco comment: smokes occasionally   Substance and Sexual Activity  . Alcohol use: No  . Drug use: No  . Sexual activity: Not on file  Lifestyle  . Physical activity    Days per week: 0 days    Minutes per session: 0 min  .  Stress: Not at all  Relationships  . Social connections    Talks on phone: More than three times a week    Gets together: More than three times a week    Attends religious service: Never    Active member of club or organization: No    Attends meetings of clubs or organizations: Never    Relationship status: Widowed  . Intimate partner violence    Fear of current or ex partner: No    Emotionally abused: No    Physically abused: No    Forced sexual activity: No  Other Topics Concern  . 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 Neck:  Supple; no masses or thyromegaly. Lungs:  Clear throughout to auscultation, normal respiratory effort.    Heart:  +S1, +S2, Regular rate and rhythm, No edema. Abdomen:  Soft, nontender and nondistended. Normal bowel  sounds, without guarding, and without rebound.   Neurologic:  Alert and  oriented x4;  grossly normal neurologically.  Impression/Plan: Annette Miller is here for an colonoscopy to be performed for surveillance due to prior history of colon polyps   Risks, benefits, limitations, and alternatives regarding  colonoscopy have been reviewed with the patient.  Questions have been answered.  All parties agreeable.   [name], MD  05/24/2019, 1:57 PM

## 2019-05-24 NOTE — Anesthesia Postprocedure Evaluation (Signed)
Anesthesia Post Note  Patient: Annette Miller  Procedure(s) Performed: COLONOSCOPY WITH PROPOFOL (N/A )  Patient location during evaluation: Endoscopy Anesthesia Type: General Level of consciousness: awake and alert and oriented Pain management: pain level controlled Vital Signs Assessment: post-procedure vital signs reviewed and stable Respiratory status: spontaneous breathing, nonlabored ventilation and respiratory function stable Cardiovascular status: blood pressure returned to baseline and stable Postop Assessment: no signs of nausea or vomiting Anesthetic complications: no     Last Vitals:  Vitals:   05/24/19 1438 05/24/19 1448  BP: 124/73 139/82  Pulse: 90 77  Resp: (!) 21 (!) 27  Temp:    SpO2: 100% 99%    Last Pain:  Vitals:   05/24/19 1448  TempSrc:   PainSc: 0-No pain                 Jarvis Sawa

## 2019-05-25 ENCOUNTER — Encounter: Payer: Self-pay | Admitting: Gastroenterology

## 2019-06-13 ENCOUNTER — Other Ambulatory Visit: Payer: Self-pay

## 2019-06-13 DIAGNOSIS — Z980 Intestinal bypass and anastomosis status: Secondary | ICD-10-CM

## 2019-06-23 ENCOUNTER — Other Ambulatory Visit
Admission: RE | Admit: 2019-06-23 | Discharge: 2019-06-23 | Disposition: A | Discharge status: Home / Self Care | Payer: Medicare HMO | Source: Ambulatory Visit | Attending: Gastroenterology | Admitting: Gastroenterology

## 2019-06-23 ENCOUNTER — Other Ambulatory Visit: Payer: Self-pay

## 2019-06-23 DIAGNOSIS — Z01812 Encounter for preprocedural laboratory examination: Secondary | ICD-10-CM | POA: Diagnosis not present

## 2019-06-23 DIAGNOSIS — Z20828 Contact with and (suspected) exposure to other viral communicable diseases: Secondary | ICD-10-CM | POA: Insufficient documentation

## 2019-06-24 LAB — SARS CORONAVIRUS 2 (TAT 6-24 HRS): SARS Coronavirus 2: NEGATIVE

## 2019-06-27 ENCOUNTER — Encounter: Payer: Self-pay | Admitting: *Deleted

## 2019-06-28 ENCOUNTER — Encounter: Payer: Self-pay | Admitting: Emergency Medicine

## 2019-06-28 ENCOUNTER — Ambulatory Visit
Admission: RE | Admit: 2019-06-28 | Discharge: 2019-06-28 | Disposition: A | Discharge status: Home / Self Care | Payer: Medicare HMO | Attending: Gastroenterology | Admitting: Gastroenterology

## 2019-06-28 ENCOUNTER — Ambulatory Visit: Payer: Medicare HMO | Admitting: Anesthesiology

## 2019-06-28 ENCOUNTER — Encounter
Admission: RE | Disposition: A | Discharge status: Home / Self Care | Payer: Self-pay | Source: Home / Self Care | Attending: Gastroenterology

## 2019-06-28 DIAGNOSIS — F17210 Nicotine dependence, cigarettes, uncomplicated: Secondary | ICD-10-CM | POA: Insufficient documentation

## 2019-06-28 DIAGNOSIS — M1990 Unspecified osteoarthritis, unspecified site: Secondary | ICD-10-CM | POA: Insufficient documentation

## 2019-06-28 DIAGNOSIS — Z79899 Other long term (current) drug therapy: Secondary | ICD-10-CM | POA: Diagnosis not present

## 2019-06-28 DIAGNOSIS — Z791 Long term (current) use of non-steroidal anti-inflammatories (NSAID): Secondary | ICD-10-CM | POA: Insufficient documentation

## 2019-06-28 DIAGNOSIS — Z885 Allergy status to narcotic agent status: Secondary | ICD-10-CM | POA: Diagnosis not present

## 2019-06-28 DIAGNOSIS — K219 Gastro-esophageal reflux disease without esophagitis: Secondary | ICD-10-CM | POA: Insufficient documentation

## 2019-06-28 DIAGNOSIS — R69 Illness, unspecified: Secondary | ICD-10-CM | POA: Diagnosis not present

## 2019-06-28 DIAGNOSIS — K913 Postprocedural intestinal obstruction, unspecified as to partial versus complete: Secondary | ICD-10-CM

## 2019-06-28 DIAGNOSIS — E785 Hyperlipidemia, unspecified: Secondary | ICD-10-CM | POA: Diagnosis not present

## 2019-06-28 DIAGNOSIS — Z886 Allergy status to analgesic agent status: Secondary | ICD-10-CM | POA: Insufficient documentation

## 2019-06-28 DIAGNOSIS — Z8674 Personal history of sudden cardiac arrest: Secondary | ICD-10-CM | POA: Diagnosis not present

## 2019-06-28 DIAGNOSIS — K9189 Other postprocedural complications and disorders of digestive system: Secondary | ICD-10-CM | POA: Insufficient documentation

## 2019-06-28 DIAGNOSIS — Z8711 Personal history of peptic ulcer disease: Secondary | ICD-10-CM | POA: Diagnosis not present

## 2019-06-28 DIAGNOSIS — Z888 Allergy status to other drugs, medicaments and biological substances status: Secondary | ICD-10-CM | POA: Insufficient documentation

## 2019-06-28 DIAGNOSIS — F329 Major depressive disorder, single episode, unspecified: Secondary | ICD-10-CM | POA: Diagnosis not present

## 2019-06-28 DIAGNOSIS — Z79890 Hormone replacement therapy (postmenopausal): Secondary | ICD-10-CM | POA: Insufficient documentation

## 2019-06-28 DIAGNOSIS — Z980 Intestinal bypass and anastomosis status: Secondary | ICD-10-CM

## 2019-06-28 HISTORY — PX: FLEXIBLE SIGMOIDOSCOPY: SHX5431

## 2019-06-28 SURGERY — SIGMOIDOSCOPY, FLEXIBLE
Anesthesia: General

## 2019-06-28 MED ORDER — PROPOFOL 500 MG/50ML IV EMUL
INTRAVENOUS | Status: AC
  Filled 2019-06-28: qty 50

## 2019-06-28 MED ORDER — PROPOFOL 500 MG/50ML IV EMUL
INTRAVENOUS | Status: DC | PRN
  Administered 2019-06-28: 100 ug/kg/min via INTRAVENOUS

## 2019-06-28 MED ORDER — SODIUM CHLORIDE 0.9 % IV SOLN
INTRAVENOUS | Status: DC
  Administered 2019-06-28: 1000 mL via INTRAVENOUS

## 2019-06-28 NOTE — Transfer of Care (Signed)
Immediate Anesthesia Transfer of Care Note  Patient: Annette Miller  Procedure(s) Performed: FLEXIBLE SIGMOIDOSCOPY with Dilation (N/A )  Patient Location: PACU  Anesthesia Type:General  Level of Consciousness: awake and sedated  Airway & Oxygen Therapy: Patient Spontanous Breathing and Patient connected to nasal cannula oxygen  Post-op Assessment: Report given to RN and Post -op Vital signs reviewed and stable  Post vital signs: Reviewed and stable  Last Vitals:  Vitals Value Taken Time  BP    Temp    Pulse    Resp    SpO2      Last Pain:  Vitals:   06/28/19 1308  TempSrc: Tympanic  PainSc: 4          Complications: No apparent anesthesia complications

## 2019-06-28 NOTE — Anesthesia Post-op Follow-up Note (Signed)
Anesthesia QCDR form completed.        

## 2019-06-28 NOTE — H&P (Signed)
Wyline MoodKiran Marquay Kruse, MD [address], [address], [place], [place], [id number] [address], [address], [place], [place], [id number] Phone: [phone]  Fax: [phone]  Primary Care Physician:  Steele Sizerrissman, Mark A, MD   Pre-Procedure History & Physical: HPI:  Annette Mattocksamela S Innocent is a 68 y.o. female is here for a a sigmoidoscopy    Past Medical History:  Diagnosis Date  . Allergy   . Anemia    improved since surgery in May 2019  . Anxiety   . Arthritis   . Depression   . GERD (gastroesophageal reflux disease) 2019   PUD, history of diverticulitis  . Goiter   . H/O cardiac arrest 03/2018   s/p surgery and while in hospital. no cardiac history  . Headache   . History of blood transfusion 01/2018  . Hyperlipidemia   . Hypothyroidism   . Osteoporosis     Past Surgical History:  Procedure Laterality Date  . ABDOMINAL HYSTERECTOMY  1979  . APPENDECTOMY  01/2018  . COLONOSCOPY WITH PROPOFOL N/A 01/14/2018   Procedure: COLONOSCOPY WITH PROPOFOL;  Surgeon: Toney ReilVanga, Rohini Reddy, MD;  [facility];  Service: Gastroenterology;  Laterality: N/A;  . COLONOSCOPY WITH PROPOFOL N/A 06/24/2018   Procedure: COLONOSCOPY WITH PROPOFOL;  Surgeon: Wyline MoodAnna, Aiva Miskell, MD;  [facility];  Service: Gastroenterology;  Laterality: N/A;  . COLONOSCOPY WITH PROPOFOL N/A 05/24/2019   Procedure: COLONOSCOPY WITH PROPOFOL;  Surgeon: Wyline MoodAnna, Chisom Muntean, MD;  [facility];  Service: Gastroenterology;  Laterality: N/A;  . COLOSTOMY TAKEDOWN N/A 08/30/2018   Procedure: COLOSTOMY TAKEDOWN;  Surgeon: Leafy RoPabon, Diego F, MD;  Location: [facility] ORS;  Service: General;  Laterality: N/A;  . ESOPHAGOGASTRODUODENOSCOPY N/A 04/06/2018   Procedure: ESOPHAGOGASTRODUODENOSCOPY (EGD);  Surgeon: Toney ReilVanga, Rohini Reddy, MD;  [facility];  Service: Gastroenterology;  Laterality: N/A;  . ESOPHAGOGASTRODUODENOSCOPY (EGD) WITH PROPOFOL N/A 01/14/2018   Procedure: ESOPHAGOGASTRODUODENOSCOPY (EGD) WITH PROPOFOL;   Surgeon: Toney ReilVanga, Rohini Reddy, MD;  [facility];  Service: Gastroenterology;  Laterality: N/A;  . ESOPHAGOGASTRODUODENOSCOPY (EGD) WITH PROPOFOL N/A 06/24/2018   Procedure: ESOPHAGOGASTRODUODENOSCOPY (EGD) WITH PROPOFOL;  Surgeon: Wyline MoodAnna, Arrabella Westerman, MD;  [facility];  Service: Gastroenterology;  Laterality: N/A;  . LAPAROTOMY N/A 02/25/2018   Procedure: EXPLORATORY LAPAROTOMY, PARTMAN'S PROCEDURE, APPENDECTOMY, COLOSTOMY;  Surgeon: Star AgeKaplin, Aviva W, DO;  Location: [facility] ORS;  Service: General;  Laterality: N/A;  . TONSILLECTOMY  1976  . VENTRAL HERNIA REPAIR N/A 08/30/2018   Procedure: HERNIA REPAIR VENTRAL ADULT;  Surgeon: Leafy RoPabon, Diego F, MD;  Location: [facility] ORS;  Service: General;  Laterality: N/A;  . VISCERAL ANGIOGRAPHY N/A 04/07/2018   Procedure: VISCERAL ANGIOGRAPHY;  Surgeon: Annice Needyew, Jason S, MD;  Location: [facility] INVASIVE CV LAB;  Service: Cardiovascular;  Laterality: N/A;    Prior to Admission medications   Medication Sig Start Date End Date Taking? Authorizing Provider  acetaminophen (TYLENOL) 325 MG tablet Take 650 mg by mouth every 6 (six) hours as needed for mild pain or fever.   Yes [provider]  Ascorbic Acid (VITAMIN C) 1000 MG tablet Take 3,000 mg by mouth daily.   Yes [provider]  azelastine (ASTELIN) 0.1 % nasal spray Place 2 sprays into both nostrils 2 (two) times daily. Use in each nostril as directed 01/16/19  Yes Crissman, Redge GainerMark A, MD  buPROPion (WELLBUTRIN SR) 150 MG 12 hr tablet Take 1 tablet (150 mg total) by mouth 2 (two) times daily. 01/09/19  Yes Crissman, Redge GainerMark A, MD  Calcium Carb-Cholecalciferol (OYSTER SHELL CALCIUM PLUS D  PO) Take 1 tablet by mouth daily.    Yes [provider]  diazepam (VALIUM) 5 MG tablet Take 1 tablet (5 mg total) by mouth 2 (two) times daily. 01/09/19  Yes Crissman, Jeannette How, MD  fexofenadine (ALLEGRA) 180 MG tablet TAKE 1 TABLET (180 MG TOTAL) BY MOUTH DAILY. 04/21/19  Yes Crissman, Jeannette How, MD  gabapentin  (NEURONTIN) 300 MG capsule Take 1 capsule (300 mg total) by mouth 3 (three) times daily. 10/10/18  Yes Crissman, Jeannette How, MD  gabapentin (NEURONTIN) 600 MG tablet TAKE 0.5 TABLETS (300MG  MG TOTAL) BY MOUTH 3 (THREE) TIMES DAILY. 05/24/19  Yes Crissman, Jeannette How, MD  levothyroxine (SYNTHROID, LEVOTHROID) 25 MCG tablet Take 1 tablet (25 mcg total) by mouth daily before breakfast. 08/24/18  Yes Crissman, Jeannette How, MD  magnesium 30 MG tablet Take 30 mg by mouth daily.    Yes [provider]  meloxicam (MOBIC) 15 MG tablet TAKE 1 TABLET BY MOUTH EVERY DAY 04/19/19  Yes Crissman, Jeannette How, MD  Multiple Vitamin (MULTIVITAMIN WITH MINERALS) TABS tablet Take 1 tablet by mouth daily. 04/14/18  Yes Dustin Flock, MD  ondansetron (ZOFRAN) 4 MG tablet Take 1 tablet as needed for nausea upto 2 times a day 05/17/19  Yes Jonathon Bellows, MD  pantoprazole (PROTONIX) 40 MG tablet Take 1 tablet (40 mg total) by mouth 2 (two) times daily. 01/09/19  Yes Crissman, Jeannette How, MD  rosuvastatin (CRESTOR) 10 MG tablet Take 1 tablet (10 mg total) by mouth daily. 08/24/18  Yes Crissman, Jeannette How, MD  Selenium 200 MCG CAPS Take 200 mcg by mouth daily.    Yes [provider]  triamcinolone cream (KENALOG) 0.1 % Apply 1 application topically 2 (two) times daily. 10/10/18  Yes Crissman, Jeannette How, MD  vitamin E 1000 UNIT capsule Take 1,000 Units by mouth 2 (two) times daily.   Yes [provider]  Zinc Sulfate (ZINC 15 PO) Take 15 mg by mouth daily.    Yes [provider]    Allergies as of 06/13/2019 - Review Complete 05/24/2019  Allergen Reaction Noted  . Aspirin  08/19/2018  . Prednisone Other (See Comments) 12/09/2017  . Codeine Diarrhea and Nausea And Vomiting 04/30/2015  . Tape Rash 04/08/2018    Family History  Problem Relation Age of Onset  . Cancer Mother   . AAA (abdominal aortic aneurysm) Father   . Cancer Maternal Grandmother   . Stroke Maternal Grandmother   . Cancer Paternal Grandfather      Social History   Socioeconomic History  . Marital status: Widowed    Spouse name: Not on file  . Number of children: Not on file  . Years of education: Not on file  . Highest education level: Associate degree: academic program  Occupational History    Comment: retired  . Occupation: [employer]     Comment: only on weekends   Social Needs  . Financial resource strain: Somewhat hard  . Food insecurity    Worry: Never true    Inability: Never true  . Transportation needs    Medical: No    Non-medical: No  Tobacco Use  . Smoking status: Current Some Day Smoker    Packs/day: 0.10    Types: Cigarettes  . Smokeless tobacco: Never Used  . Tobacco comment: smokes occasionally   Substance and Sexual Activity  . Alcohol use: No  . Drug use: No  . Sexual activity: Not on file  Lifestyle  .  Physical activity    Days per week: 0 days    Minutes per session: 0 min  . Stress: Not at all  Relationships  . Social connections    Talks on phone: More than three times a week    Gets together: More than three times a week    Attends religious service: Never    Active member of club or organization: No    Attends meetings of clubs or organizations: Never    Relationship status: Widowed  . Intimate partner violence    Fear of current or ex partner: No    Emotionally abused: No    Physically abused: No    Forced sexual activity: No  Other Topics Concern  . 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 Neck:  Supple; no masses or thyromegaly. Lungs:  Clear throughout to auscultation, normal respiratory  effort.    Heart:  +S1, +S2, Regular rate and rhythm, No edema. Abdomen:  Soft, nontender and nondistended. Normal bowel sounds, without guarding, and without rebound.   Neurologic:  Alert and  oriented x4;  grossly normal neurologically.  Impression/Plan: ARFA LAMARCA is here for a sigmoidoscopy and dialtion  to be performed for anastamotic stricture Risks, benefits, limitations, and alternatives regarding  colonoscopy have been reviewed with the patient.  Questions have been answered.  All parties agreeable.   Annette Mattocks, MD  06/28/2019, 1:27 PM

## 2019-06-28 NOTE — Anesthesia Preprocedure Evaluation (Signed)
Anesthesia Evaluation  Patient identified by MRN, date of birth, ID band Patient awake    Reviewed: Allergy & Precautions, NPO status , Patient's Chart, lab work & pertinent test results  History of Anesthesia Complications Negative for: history of anesthetic complications  Airway Mallampati: II  TM Distance: >3 FB Neck ROM: Full    Dental no notable dental hx.    Pulmonary neg sleep apnea, neg COPD, Current SmokerPatient did not abstain from smoking.,    breath sounds clear to auscultation- rhonchi (-) wheezing      Cardiovascular Exercise Tolerance: Good (-) hypertension(-) CAD, (-) Past MI, (-) Cardiac Stents and (-) CABG  Rhythm:Regular Rate:Normal - Systolic murmurs and - Diastolic murmurs    Neuro/Psych  Headaches, neg Seizures PSYCHIATRIC DISORDERS Anxiety Depression    GI/Hepatic Neg liver ROS, GERD  ,  Endo/Other  neg diabetesHypothyroidism   Renal/GU Renal disease: hx of nephrolithiasis.     Musculoskeletal  (+) Arthritis ,   Abdominal (+) - obese,   Peds  Hematology  (+) anemia ,   Anesthesia Other Findings Past Medical History: No date: Allergy No date: Anemia     Comment:  improved since surgery in May 2019 No date: Anxiety No date: Arthritis No date: Depression 2019: GERD (gastroesophageal reflux disease)     Comment:  PUD, history of diverticulitis No date: Goiter 03/2018: H/O cardiac arrest     Comment:  s/p surgery and while in hospital. no cardiac history No date: Headache 01/2018: History of blood transfusion No date: Hyperlipidemia No date: Hypothyroidism No date: Osteoporosis   Reproductive/Obstetrics                             Anesthesia Physical  Anesthesia Plan  ASA: II  Anesthesia Plan: General   Post-op Pain Management:    Induction: Intravenous  PONV Risk Score and Plan: 1 and Propofol infusion  Airway Management Planned: Natural  Airway  Additional Equipment:   Intra-op Plan:   Post-operative Plan:   Informed Consent: I have reviewed the patients History and Physical, chart, labs and discussed the procedure including the risks, benefits and alternatives for the proposed anesthesia with the patient or authorized representative who has indicated his/her understanding and acceptance.     Dental advisory given  Plan Discussed with: CRNA and Anesthesiologist  Anesthesia Plan Comments:         Anesthesia Quick Evaluation

## 2019-06-28 NOTE — Op Note (Signed)
[facility] Gastroenterology Patient Name: Annette Miller Procedure Date: 06/28/2019 1:26 PM MRN: [id number] Account #: [id number] Date of Birth: 09-17-1951 Admit Type: Outpatient Age: 68 Room: [facility] ENDO ROOM 1 Gender: Female Note Status: Finalized Procedure:            Flexible Sigmoidoscopy Indications:          Follow up to assess response to treatment for                        anastamotic stricture Providers:            [name] MD, MD Referring MD:         Steele Sizer, MD (Referring MD) Medicines:            Monitored Anesthesia Care Complications:        No immediate complications. Procedure:            Pre-Anesthesia Assessment:                       - Prior to the procedure, a History and Physical was                        performed, and patient medications, allergies and                        sensitivities were reviewed. The patient's tolerance of                        previous anesthesia was reviewed.                       - The risks and benefits of the procedure and the                        sedation options and risks were discussed with the                        patient. All questions were answered and informed                        consent was obtained.                       - ASA Grade Assessment: II - A patient with mild                        systemic disease.                       After obtaining informed consent, the scope was passed                        under direct vision. The Endoscope was introduced                        through the anus and advanced to the the left                        transverse colon. The flexible sigmoidoscopy was  accomplished with ease. The patient tolerated the                        procedure well. The quality of the bowel preparation                        was adequate. Findings:      The perianal and digital rectal examinations were normal.      There was evidence of a  prior end-to-end colo-colonic anastomosis in the       sigmoid colon. This was non-patent and was characterized by severe       stenosis. The anastomosis was traversed after dilation. A TTS dilator       was passed through the scope. Dilation with an 05-06-09 mm and a       12-13.5-15 mm colonic balloon dilator was performed. The dilation site       was examined following endoscope reinsertion and showed moderate mucosal       disruption.      The exam was otherwise without abnormality. Impression:           - Non-patent end-to-end colo-colonic anastomosis,                        characterized by severe stenosis. Dilated.                       - The examination was otherwise normal.                       - No specimens collected. Recommendation:       - Discharge patient to home (with escort).                       - Advance diet as tolerated.                       - Repeat flexible sigmoidoscopy in 4 weeks to evaluate                        the response to therapy.                       - If recurs then will needs surgery Procedure Code(s):    --- Professional ---                       [id number], Sigmoidoscopy, flexible; with transendoscopic                        balloon dilation Diagnosis Code(s):    --- Professional ---                       K91.89, Other postprocedural complications and                        disorders of digestive system CPT copyright 2019 American Medical Association. All rights reserved. The codes documented in this report are preliminary and upon coder review may  be revised to meet current compliance requirements. Wyline MoodKiran Ramonia Mcclaran, MD Wyline MoodKiran Radiah Lubinski MD, MD 06/28/2019 1:53:17 PM This report has been signed electronically. Number of Addenda: 0 Note Initiated On: 06/28/2019 1:26 PM Scope Withdrawal Time:  0 hours 8 minutes 14 seconds  Total Procedure Duration: 0 hours 10 minutes 44 seconds  Estimated Blood Loss: Estimated blood loss: none.      [facility]

## 2019-06-28 NOTE — Anesthesia Procedure Notes (Signed)
Performed by: Cook-Martin, Makar Slatter Pre-anesthesia Checklist: Patient identified, Emergency Drugs available, Suction available, Patient being monitored and Timeout performed Patient Re-evaluated:Patient Re-evaluated prior to induction Oxygen Delivery Method: Nasal cannula Preoxygenation: Pre-oxygenation with 100% oxygen Induction Type: IV induction Placement Confirmation: positive ETCO2 and CO2 detector       

## 2019-06-30 ENCOUNTER — Encounter: Payer: Self-pay | Admitting: Gastroenterology

## 2019-06-30 NOTE — Anesthesia Postprocedure Evaluation (Signed)
Anesthesia Post Note  Patient: Annette Miller  Procedure(s) Performed: FLEXIBLE SIGMOIDOSCOPY with Dilation (N/A )  Patient location during evaluation: Endoscopy Anesthesia Type: General Level of consciousness: awake and alert and oriented Pain management: pain level controlled Vital Signs Assessment: post-procedure vital signs reviewed and stable Respiratory status: spontaneous breathing, nonlabored ventilation and respiratory function stable Cardiovascular status: blood pressure returned to baseline and stable Postop Assessment: no signs of nausea or vomiting Anesthetic complications: no     Last Vitals:  Vitals:   06/28/19 1308 06/28/19 1353  BP: (!) 150/87 (!) 110/54  Pulse: (!) 104 84  Resp: 17 (!) 25  Temp: (!) 35.1 C (!) 36 C  SpO2: 98% 100%    Last Pain:  Vitals:   06/28/19 1353  TempSrc: Tympanic  PainSc: Asleep                 Yahya Boldman

## 2019-07-06 ENCOUNTER — Other Ambulatory Visit: Payer: Self-pay | Admitting: Family Medicine

## 2019-07-06 DIAGNOSIS — F329 Major depressive disorder, single episode, unspecified: Secondary | ICD-10-CM

## 2019-07-06 DIAGNOSIS — F32A Depression, unspecified: Secondary | ICD-10-CM

## 2019-07-11 ENCOUNTER — Telehealth: Payer: Self-pay

## 2019-07-11 ENCOUNTER — Other Ambulatory Visit: Payer: Self-pay

## 2019-07-11 DIAGNOSIS — Z980 Intestinal bypass and anastomosis status: Secondary | ICD-10-CM

## 2019-07-11 NOTE — Telephone Encounter (Signed)
Patient called back and scheduled procedure on 07/20/2019 and COVID test on 07/17/2019. Went over instructions with patient and sent them to her mychart account

## 2019-07-11 NOTE — Telephone Encounter (Signed)
Called and left a message and sent a mychart message to scheduled 4 week Flexible sigmoidoscopy with Dr. Vicente Males

## 2019-07-17 ENCOUNTER — Other Ambulatory Visit
Admission: RE | Admit: 2019-07-17 | Discharge: 2019-07-17 | Disposition: A | Discharge status: Home / Self Care | Payer: Medicare HMO | Source: Ambulatory Visit | Attending: Gastroenterology | Admitting: Gastroenterology

## 2019-07-17 DIAGNOSIS — Z20828 Contact with and (suspected) exposure to other viral communicable diseases: Secondary | ICD-10-CM | POA: Insufficient documentation

## 2019-07-17 DIAGNOSIS — Z01812 Encounter for preprocedural laboratory examination: Secondary | ICD-10-CM | POA: Diagnosis not present

## 2019-07-17 LAB — SARS CORONAVIRUS 2 (TAT 6-24 HRS): SARS Coronavirus 2: NEGATIVE

## 2019-07-20 ENCOUNTER — Other Ambulatory Visit: Payer: Self-pay | Admitting: Family Medicine

## 2019-07-20 ENCOUNTER — Encounter: Payer: Self-pay | Admitting: Anesthesiology

## 2019-07-20 ENCOUNTER — Ambulatory Visit
Admission: RE | Admit: 2019-07-20 | Discharge: 2019-07-20 | Disposition: A | Discharge status: Home / Self Care | Payer: Medicare HMO | Attending: Gastroenterology | Admitting: Gastroenterology

## 2019-07-20 ENCOUNTER — Ambulatory Visit: Payer: Medicare HMO | Admitting: Anesthesiology

## 2019-07-20 ENCOUNTER — Other Ambulatory Visit: Payer: Self-pay

## 2019-07-20 ENCOUNTER — Encounter
Admission: RE | Disposition: A | Discharge status: Home / Self Care | Payer: Self-pay | Source: Home / Self Care | Attending: Gastroenterology

## 2019-07-20 DIAGNOSIS — Z79890 Hormone replacement therapy (postmenopausal): Secondary | ICD-10-CM | POA: Diagnosis not present

## 2019-07-20 DIAGNOSIS — E785 Hyperlipidemia, unspecified: Secondary | ICD-10-CM | POA: Insufficient documentation

## 2019-07-20 DIAGNOSIS — F329 Major depressive disorder, single episode, unspecified: Secondary | ICD-10-CM | POA: Insufficient documentation

## 2019-07-20 DIAGNOSIS — Z8674 Personal history of sudden cardiac arrest: Secondary | ICD-10-CM | POA: Diagnosis not present

## 2019-07-20 DIAGNOSIS — E039 Hypothyroidism, unspecified: Secondary | ICD-10-CM | POA: Diagnosis not present

## 2019-07-20 DIAGNOSIS — F17210 Nicotine dependence, cigarettes, uncomplicated: Secondary | ICD-10-CM | POA: Insufficient documentation

## 2019-07-20 DIAGNOSIS — Z79899 Other long term (current) drug therapy: Secondary | ICD-10-CM | POA: Diagnosis not present

## 2019-07-20 DIAGNOSIS — F419 Anxiety disorder, unspecified: Secondary | ICD-10-CM | POA: Insufficient documentation

## 2019-07-20 DIAGNOSIS — Z980 Intestinal bypass and anastomosis status: Secondary | ICD-10-CM | POA: Diagnosis not present

## 2019-07-20 DIAGNOSIS — K913 Postprocedural intestinal obstruction, unspecified as to partial versus complete: Secondary | ICD-10-CM

## 2019-07-20 DIAGNOSIS — K219 Gastro-esophageal reflux disease without esophagitis: Secondary | ICD-10-CM | POA: Diagnosis not present

## 2019-07-20 DIAGNOSIS — Z791 Long term (current) use of non-steroidal anti-inflammatories (NSAID): Secondary | ICD-10-CM | POA: Insufficient documentation

## 2019-07-20 DIAGNOSIS — I252 Old myocardial infarction: Secondary | ICD-10-CM | POA: Insufficient documentation

## 2019-07-20 DIAGNOSIS — R69 Illness, unspecified: Secondary | ICD-10-CM | POA: Diagnosis not present

## 2019-07-20 HISTORY — PX: FLEXIBLE SIGMOIDOSCOPY: SHX5431

## 2019-07-20 SURGERY — SIGMOIDOSCOPY, FLEXIBLE
Anesthesia: General

## 2019-07-20 MED ORDER — SODIUM CHLORIDE 0.9 % IV SOLN
INTRAVENOUS | Status: DC
  Administered 2019-07-20: 10:00:00 via INTRAVENOUS

## 2019-07-20 MED ORDER — PROPOFOL 10 MG/ML IV BOLUS
INTRAVENOUS | Status: DC | PRN
  Administered 2019-07-20: 70 mg via INTRAVENOUS

## 2019-07-20 MED ORDER — PROPOFOL 500 MG/50ML IV EMUL
INTRAVENOUS | Status: DC | PRN
  Administered 2019-07-20: 140 ug/kg/min via INTRAVENOUS

## 2019-07-20 NOTE — Transfer of Care (Signed)
Immediate Anesthesia Transfer of Care Note  Patient: Annette Miller  Procedure(s) Performed: FLEXIBLE SIGMOIDOSCOPY (N/A )  Patient Location: PACU  Anesthesia Type:General  Level of Consciousness: sedated  Airway & Oxygen Therapy: Patient Spontanous Breathing  Post-op Assessment: Report given to RN and Post -op Vital signs reviewed and stable  Post vital signs: Reviewed and stable  Last Vitals:  Vitals Value Taken Time  BP 121/64 07/20/19 1026  Temp 36.4 C 07/20/19 1026  Pulse 77 07/20/19 1026  Resp 21 07/20/19 1026  SpO2 98 % 07/20/19 1026    Last Pain:  Vitals:   07/20/19 0931  TempSrc: Tympanic         Complications: No apparent anesthesia complications

## 2019-07-20 NOTE — Telephone Encounter (Signed)
Need refill protocol for this medication, gabapentin 600 mg.

## 2019-07-20 NOTE — Anesthesia Post-op Follow-up Note (Signed)
Anesthesia QCDR form completed.        

## 2019-07-20 NOTE — Op Note (Addendum)
[facility] Gastroenterology Patient Name: Annette Miller Procedure Date: 07/20/2019 10:10 AM MRN: [id number] Account #: [id number] Date of Birth: Aug 23, 1951 Admit Type: Outpatient Age: 68 Room: [facility] ENDO ROOM 4 Gender: Female Note Status: Finalized Procedure:            Flexible Sigmoidoscopy Indications:          Monitoring for anastomosis integrity Providers:            Jonathon Bellows MD, MD Referring MD:         Guadalupe Maple, MD (Referring MD) Medicines:            Monitored Anesthesia Care Complications:        No immediate complications. Procedure:            Pre-Anesthesia Assessment:                       - Prior to the procedure, a History and Physical was                        performed, and patient medications, allergies and                        sensitivities were reviewed. The patient's tolerance of                        previous anesthesia was reviewed.                       - The risks and benefits of the procedure and the                        sedation options and risks were discussed with the                        patient. All questions were answered and informed                        consent was obtained.                       - ASA Grade Assessment: II - A patient with mild                        systemic disease.                       After obtaining informed consent, the scope was passed                        under direct vision. The Endoscope was introduced                        through the anus and advanced to the the left                        transverse colon. The flexible sigmoidoscopy was                        accomplished with ease. The patient tolerated the  procedure well. The quality of the bowel preparation                        was good. Findings:      The perianal and digital rectal examinations were normal.      A benign-appearing, intrinsic moderate stenosis measuring less than one       cm  (in length) x 1.4 cm (inner diameter) was found at the anastomosis       and was traversed. A TTS dilator was passed through the scope. Dilation       with a 12-13.5-15 mm balloon and a 15-16.5-18 mm balloon dilator was       performed to 16.5 mm. The dilation site was examined and showed complete       resolution of luminal narrowing.      The exam was otherwise without abnormality. Impression:           - Stricture at the colonic anastomosis. Dilated.                       - The examination was otherwise normal.                       - No specimens collected. Recommendation:       - Discharge patient to home (with escort).                       - Return to my office in 6 weeks. Procedure Code(s):    --- Professional ---                       [id number], Sigmoidoscopy, flexible; with transendoscopic                        balloon dilation Diagnosis Code(s):    --- Professional ---                       [phone], Other intestinal obstruction unspecified as to                        partial versus complete obstruction                       Z09, Encounter for follow-up examination after                        completed treatment for conditions other than malignant                        neoplasm                       Z98.0, Intestinal bypass and anastomosis status CPT copyright 2019 American Medical Association. All rights reserved. The codes documented in this report are preliminary and upon coder review may  be revised to meet current compliance requirements. [name], MD [name] MD, MD 07/20/2019 10:27:48 AM This report has been signed electronically. Number of Addenda: 0 Note Initiated On: 07/20/2019 10:10 AM Total Procedure Duration: 0 hours 8 minutes 19 seconds  Estimated Blood Loss: Estimated blood loss: none.      [facility]

## 2019-07-20 NOTE — H&P (Signed)
Jonathon Bellows, MD [address] [place], [place], [place], [address] [address], [place], [place], [place], [address] Phone: [phone]  Fax: [phone]  Primary Care Physician:  Guadalupe Maple, MD   Pre-Procedure History & Physical: HPI:  Annette Miller is a 68 y.o. female is here for a sigmoidoscopy    Past Medical History:  Diagnosis Date  . Allergy   . Anemia    improved since surgery in May 2019  . Anxiety   . Arthritis   . Depression   . GERD (gastroesophageal reflux disease) 2019   PUD, history of diverticulitis  . Goiter   . H/O cardiac arrest 03/2018   s/p surgery and while in hospital. no cardiac history  . Headache   . History of blood transfusion 01/2018  . Hyperlipidemia   . Hypothyroidism   . Osteoporosis     Past Surgical History:  Procedure Laterality Date  . ABDOMINAL HYSTERECTOMY  1979  . APPENDECTOMY  01/2018  . COLONOSCOPY WITH PROPOFOL N/A 01/14/2018   Procedure: COLONOSCOPY WITH PROPOFOL;  Surgeon: Lin Landsman, MD;  Location: [facility] ENDOSCOPY;  Service: Gastroenterology;  Laterality: N/A;  . COLONOSCOPY WITH PROPOFOL N/A 06/24/2018   Procedure: COLONOSCOPY WITH PROPOFOL;  Surgeon: Jonathon Bellows, MD;  Location: [facility] ENDOSCOPY;  Service: Gastroenterology;  Laterality: N/A;  . COLONOSCOPY WITH PROPOFOL N/A 05/24/2019   Procedure: COLONOSCOPY WITH PROPOFOL;  Surgeon: Jonathon Bellows, MD;  Location: [facility] ENDOSCOPY;  Service: Gastroenterology;  Laterality: N/A;  . COLOSTOMY TAKEDOWN N/A 08/30/2018   Procedure: COLOSTOMY TAKEDOWN;  Surgeon: Jules Husbands, MD;  Location: [facility] ORS;  Service: General;  Laterality: N/A;  . DIAGNOSTIC LAPAROSCOPY    . ESOPHAGOGASTRODUODENOSCOPY N/A 04/06/2018   Procedure: ESOPHAGOGASTRODUODENOSCOPY (EGD);  Surgeon: Lin Landsman, MD;  Location: [facility] ENDOSCOPY;  Service: Gastroenterology;  Laterality: N/A;  . ESOPHAGOGASTRODUODENOSCOPY (EGD) WITH PROPOFOL N/A 01/14/2018   Procedure: ESOPHAGOGASTRODUODENOSCOPY  (EGD) WITH PROPOFOL;  Surgeon: Lin Landsman, MD;  Location: [facility] ENDOSCOPY;  Service: Gastroenterology;  Laterality: N/A;  . ESOPHAGOGASTRODUODENOSCOPY (EGD) WITH PROPOFOL N/A 06/24/2018   Procedure: ESOPHAGOGASTRODUODENOSCOPY (EGD) WITH PROPOFOL;  Surgeon: Jonathon Bellows, MD;  Location: [facility] ENDOSCOPY;  Service: Gastroenterology;  Laterality: N/A;  . FLEXIBLE SIGMOIDOSCOPY N/A 06/28/2019   Procedure: FLEXIBLE SIGMOIDOSCOPY with Dilation;  Surgeon: Jonathon Bellows, MD;  Location: [facility] ENDOSCOPY;  Service: Gastroenterology;  Laterality: N/A;  . LAPAROTOMY N/A 02/25/2018   Procedure: EXPLORATORY LAPAROTOMY, PARTMAN'S PROCEDURE, APPENDECTOMY, COLOSTOMY;  Surgeon: Hadley Pen, DO;  Location: [facility] ORS;  Service: General;  Laterality: N/A;  . TONSILLECTOMY  1976  . VENTRAL HERNIA REPAIR N/A 08/30/2018   Procedure: HERNIA REPAIR VENTRAL ADULT;  Surgeon: Jules Husbands, MD;  Location: [facility] ORS;  Service: General;  Laterality: N/A;  . VISCERAL ANGIOGRAPHY N/A 04/07/2018   Procedure: VISCERAL ANGIOGRAPHY;  Surgeon: Algernon Huxley, MD;  Location: [facility] CV LAB;  Service: Cardiovascular;  Laterality: N/A;    Prior to Admission medications   Medication Sig Start Date End Date Taking? Authorizing Provider  acetaminophen (TYLENOL) 325 MG tablet Take 650 mg by mouth every 6 (six) hours as needed for mild pain or fever.   Yes [provider]  Ascorbic Acid (VITAMIN C) 1000 MG tablet Take 3,000 mg by mouth daily.   Yes [provider]  azelastine (ASTELIN) 0.1 % nasal spray Place 2 sprays into both nostrils 2 (two) times daily. Use in each nostril as directed 01/16/19  Yes Crissman, Jeannette How, MD  buPROPion [facility]  SR) 150 MG 12 hr tablet Take 1 tablet (150 mg total) by mouth 2 (two) times daily. 01/09/19  Yes Crissman, Redge Gainer, MD  Calcium Carb-Cholecalciferol (OYSTER SHELL CALCIUM PLUS D PO) Take 1 tablet by mouth daily.    Yes [provider]  diazepam (VALIUM) 5 MG tablet TAKE 1  TABLET BY MOUTH TWICE A DAY 07/07/19  Yes Crissman, Mark A, MD  fexofenadine (ALLEGRA) 180 MG tablet TAKE 1 TABLET (180 MG TOTAL) BY MOUTH DAILY. 04/21/19  Yes Crissman, Redge Gainer, MD  gabapentin (NEURONTIN) 300 MG capsule Take 1 capsule (300 mg total) by mouth 3 (three) times daily. 10/10/18  Yes Crissman, Redge Gainer, MD  gabapentin (NEURONTIN) 600 MG tablet TAKE 0.5 TABLETS (300MG  MG TOTAL) BY MOUTH 3 (THREE) TIMES DAILY. 05/24/19  Yes Crissman, 05/26/19, MD  levothyroxine (SYNTHROID, LEVOTHROID) 25 MCG tablet Take 1 tablet (25 mcg total) by mouth daily before breakfast. 08/24/18  Yes Crissman, 08/26/18, MD  magnesium 30 MG tablet Take 30 mg by mouth daily.    Yes [provider]  meloxicam (MOBIC) 15 MG tablet TAKE 1 TABLET BY MOUTH EVERY DAY 04/19/19  Yes Crissman, 04/21/19, MD  Multiple Vitamin (MULTIVITAMIN WITH MINERALS) TABS tablet Take 1 tablet by mouth daily. 04/14/18  Yes 04/16/18, MD  ondansetron (ZOFRAN) 4 MG tablet Take 1 tablet as needed for nausea upto 2 times a day 05/17/19  Yes 05/19/19, MD  pantoprazole (PROTONIX) 40 MG tablet Take 1 tablet (40 mg total) by mouth 2 (two) times daily. 01/09/19  Yes Crissman, 01/11/19, MD  rosuvastatin (CRESTOR) 10 MG tablet Take 1 tablet (10 mg total) by mouth daily. 08/24/18  Yes Crissman, 08/26/18, MD  Selenium 200 MCG CAPS Take 200 mcg by mouth daily.    Yes [provider]  triamcinolone cream (KENALOG) 0.1 % Apply 1 application topically 2 (two) times daily. 10/10/18  Yes Crissman, 10/12/18, MD  vitamin E 1000 UNIT capsule Take 1,000 Units by mouth 2 (two) times daily.   Yes [provider]  Zinc Sulfate (ZINC 15 PO) Take 15 mg by mouth daily.    Yes [provider]    Allergies as of 07/12/2019 - Review Complete 06/28/2019  Allergen Reaction Noted  . Aspirin  08/19/2018  . Prednisone Other (See Comments) 12/09/2017  . Codeine Diarrhea and Nausea And Vomiting 04/30/2015  . Tape Rash 04/08/2018    Family History   Problem Relation Age of Onset  . Cancer Mother   . AAA (abdominal aortic aneurysm) Father   . Cancer Maternal Grandmother   . Stroke Maternal Grandmother   . Cancer Paternal Grandfather     Social History   Socioeconomic History  . Marital status: Widowed    Spouse name: Not on file  . Number of children: Not on file  . Years of education: Not on file  . Highest education level: Associate degree: academic program  Occupational History    Comment: retired  . 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 Financial resource strain: Somewhat hard  . Food insecurity    Worry: Never true    Inability: Never true  . Transportation needs    Medical: No    Non-medical: No  Tobacco Use  . Smoking status: Current Some Day Smoker    Packs/day: 0.10    Types: Cigarettes  . Smokeless tobacco: Never Used  . Tobacco comment:  smokes occasionally   Substance and Sexual Activity  . Alcohol use: No  . Drug use: No  . Sexual activity: Not on file  Lifestyle  . Physical activity    Days per week: 0 days    Minutes per session: 0 min  . Stress: Not at all  Relationships  . Social connections    Talks on phone: More than three times a week    Gets together: More than three times a week    Attends religious service: Never    Active member of club or organization: No    Attends meetings of clubs or organizations: Never    Relationship status: Widowed  . Intimate partner violence    Fear of current or ex partner: No    Emotionally abused: No    Physically abused: No    Forced sexual activity: No  Other Topics Concern  . 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 Neck:  Supple; no masses or thyromegaly. Lungs:  Clear throughout to auscultation, normal respiratory effort.    Heart:  +S1, +S2, Regular rate and rhythm, No edema. Abdomen:  Soft, nontender and nondistended. Normal bowel sounds, without guarding, and without rebound.   Neurologic:  Alert and  oriented x4;  grossly normal neurologically.  Impression/Plan: Annette Miller is here for a sigmoidoscopy to evaluate anastamotic stricture Risks, benefits, limitations, and alternatives regarding  colonoscopy have been reviewed with the patient.  Questions have been answered.  All parties agreeable.   Wyline MoodKiran Merrit Waugh, MD  07/20/2019, 10:07 AM

## 2019-07-20 NOTE — Anesthesia Preprocedure Evaluation (Signed)
Anesthesia Evaluation  Patient identified by MRN, date of birth, ID band Patient awake    Reviewed: Allergy & Precautions, NPO status , Patient's Chart, lab work & pertinent test results, reviewed documented beta blocker date and time   Airway Mallampati: II  TM Distance: >3 FB     Dental  (+) Chipped   Pulmonary Current Smoker,           Cardiovascular + Past MI       Neuro/Psych  Headaches, PSYCHIATRIC DISORDERS Anxiety Depression    GI/Hepatic GERD  ,  Endo/Other  Hypothyroidism   Renal/GU Renal disease     Musculoskeletal  (+) Arthritis ,   Abdominal   Peds  Hematology  (+) anemia ,   Anesthesia Other Findings   Reproductive/Obstetrics                             Anesthesia Physical Anesthesia Plan  ASA: III  Anesthesia Plan: General   Post-op Pain Management:    Induction: Intravenous  PONV Risk Score and Plan:   Airway Management Planned:   Additional Equipment:   Intra-op Plan:   Post-operative Plan:   Informed Consent: I have reviewed the patients History and Physical, chart, labs and discussed the procedure including the risks, benefits and alternatives for the proposed anesthesia with the patient or authorized representative who has indicated his/her understanding and acceptance.       Plan Discussed with: CRNA  Anesthesia Plan Comments:         Anesthesia Quick Evaluation

## 2019-07-20 NOTE — Anesthesia Postprocedure Evaluation (Signed)
Anesthesia Post Note  Patient: Annette Miller  Procedure(s) Performed: FLEXIBLE SIGMOIDOSCOPY (N/A )  Patient location during evaluation: Endoscopy Anesthesia Type: General Level of consciousness: awake and alert Pain management: pain level controlled Vital Signs Assessment: post-procedure vital signs reviewed and stable Respiratory status: spontaneous breathing, nonlabored ventilation, respiratory function stable and patient connected to nasal cannula oxygen Cardiovascular status: blood pressure returned to baseline and stable Postop Assessment: no apparent nausea or vomiting Anesthetic complications: no     Last Vitals:  Vitals:   07/20/19 1036 07/20/19 1046  BP: 120/70 (!) 143/73  Pulse: 72 66  Resp: 16 17  Temp:    SpO2: 99% 100%    Last Pain:  Vitals:   07/20/19 1046  TempSrc:   PainSc: 0-No pain                 Sael Furches S

## 2019-08-15 DIAGNOSIS — U071 COVID-19: Secondary | ICD-10-CM | POA: Diagnosis not present

## 2019-08-29 DIAGNOSIS — U071 COVID-19: Secondary | ICD-10-CM | POA: Diagnosis not present

## 2019-09-04 DIAGNOSIS — U071 COVID-19: Secondary | ICD-10-CM | POA: Diagnosis not present

## 2019-09-07 DIAGNOSIS — U071 COVID-19: Secondary | ICD-10-CM | POA: Diagnosis not present

## 2019-09-11 DIAGNOSIS — U071 COVID-19: Secondary | ICD-10-CM | POA: Diagnosis not present

## 2019-09-14 DIAGNOSIS — U071 COVID-19: Secondary | ICD-10-CM | POA: Diagnosis not present

## 2019-09-15 ENCOUNTER — Other Ambulatory Visit: Payer: Self-pay | Admitting: Gastroenterology

## 2019-09-18 ENCOUNTER — Other Ambulatory Visit: Payer: Self-pay

## 2019-09-18 DIAGNOSIS — U071 COVID-19: Secondary | ICD-10-CM | POA: Diagnosis not present

## 2019-09-18 NOTE — Telephone Encounter (Signed)
Patient's last regular appointment was 01/09/19

## 2019-09-19 MED ORDER — MELOXICAM 15 MG PO TABS: 15.0000 mg | ORAL_TABLET | Freq: Every day | ORAL | 0 refills | Status: DC

## 2019-09-25 DIAGNOSIS — U071 COVID-19: Secondary | ICD-10-CM | POA: Diagnosis not present

## 2019-09-26 ENCOUNTER — Other Ambulatory Visit: Payer: Self-pay

## 2019-09-26 NOTE — Telephone Encounter (Signed)
Patient had last regular follow up visit 01/09/19

## 2019-09-27 MED ORDER — LEVOTHYROXINE SODIUM 25 MCG PO TABS: 25.0000 ug | ORAL_TABLET | Freq: Every day | ORAL | 0 refills | Status: DC

## 2019-09-27 MED ORDER — ROSUVASTATIN CALCIUM 10 MG PO TABS: 10.0000 mg | ORAL_TABLET | Freq: Every day | ORAL | 0 refills | Status: DC

## 2019-10-02 DIAGNOSIS — U071 COVID-19: Secondary | ICD-10-CM | POA: Diagnosis not present

## 2019-10-27 ENCOUNTER — Other Ambulatory Visit: Payer: Self-pay

## 2019-10-27 ENCOUNTER — Ambulatory Visit (INDEPENDENT_AMBULATORY_CARE_PROVIDER_SITE_OTHER): Payer: Medicare HMO | Admitting: Nurse Practitioner

## 2019-10-27 ENCOUNTER — Encounter: Payer: Self-pay | Admitting: Nurse Practitioner

## 2019-10-27 VITALS — BP 142/83 | HR 80 | Temp 98.5°F | Ht 62.0 in | Wt 104.0 lb

## 2019-10-27 DIAGNOSIS — M25572 Pain in left ankle and joints of left foot: Secondary | ICD-10-CM | POA: Diagnosis not present

## 2019-10-27 DIAGNOSIS — T7840XD Allergy, unspecified, subsequent encounter: Secondary | ICD-10-CM | POA: Diagnosis not present

## 2019-10-27 MED ORDER — CYCLOBENZAPRINE HCL 5 MG PO TABS: 2.5000 mg | ORAL_TABLET | Freq: Three times a day (TID) | ORAL | 0 refills | Status: DC | PRN

## 2019-10-27 MED ORDER — AZELASTINE HCL 0.1 % NA SOLN: 2.0000 | Freq: Two times a day (BID) | NASAL | 12 refills | Status: DC

## 2019-10-27 NOTE — Patient Instructions (Signed)
Ankle Exercises Ask your health care provider which exercises are safe for you. Do exercises exactly as told by your health care provider and adjust them as directed. It is normal to feel mild stretching, pulling, tightness, or mild discomfort as you do these exercises. Stop right away if you feel sudden pain or your pain gets worse. Do not begin these exercises until told by your health care provider. Stretching and range-of-motion exercises These exercises warm up your muscles and joints and improve the movement and flexibility of your ankle. These exercises may also help to relieve pain. Dorsiflexion/plantar flexion  1. Sit with your __________ knee straight or bent. Do not rest your foot on anything. 2. Flex your __________ ankle to tilt the top of your foot toward your shin. This is called dorsiflexion. 3. Hold this position for __________ seconds. 4. Point your toes downward to tilt the top of your foot away from your shin. This is called plantar flexion. 5. Hold this position for __________ seconds. Repeat __________ times. Complete this exercise __________ times a day. Ankle alphabet  1. Sit with your __________ foot supported at your lower leg. ? Do not rest your foot on anything. ? Make sure your foot has room to move freely. 2. Think of your __________ foot as a paintbrush: ? Move your foot to trace each letter of the alphabet in the air. Keep your hip and knee still while you trace the letters. Trace every letter from A to Z. ? Make the letters as large as you can without causing or increasing any discomfort. Repeat __________ times. Complete this exercise __________ times a day. Passive ankle dorsiflexion This is an exercise in which something or someone moves your ankle for you. You do not move it yourself. 1. Sit on a chair that is placed on a non-carpeted surface. 2. Place your __________ foot on the floor, directly under your __________ knee. Extend your __________ leg for  support. 3. Keeping your heel down, slide your __________ foot back toward the chair until you feel a stretch at your ankle or calf. If you do not feel a stretch, slide your buttocks forward to the edge of the chair while keeping your heel down. 4. Hold this stretch for __________ seconds. Repeat __________ times. Complete this exercise __________ times a day. Strengthening exercises These exercises build strength and endurance in your ankle. Endurance is the ability to use your muscles for a long time, even after they get tired. Dorsiflexors These are muscles that lift your foot up. 1. Secure a rubber exercise band or tube to an object, such as a table leg, that will stay still when the band is pulled. Secure the other end around your __________ foot. 2. Sit on the floor, facing the object with your __________ leg extended. The band or tube should be slightly tense when your foot is relaxed. 3. Slowly flex your __________ ankle and toes to bring your foot toward your shin. 4. Hold this position for __________ seconds. 5. Slowly return your foot to the starting position, controlling the band as you do that. Repeat __________ times. Complete this exercise __________ times a day. Plantar flexors These are muscles that push your foot down. 1. Sit on the floor with your __________ leg extended. 2. Loop a rubber exercise band or tube around the ball of your __________ foot. The ball of your foot is on the walking surface, right under your toes. The band or tube should be slightly tense when your   foot is relaxed. 3. Slowly point your toes downward, pushing them away from you. 4. Hold this position for __________ seconds. 5. Slowly release the tension in the band or tube, controlling smoothly until your foot is back in the starting position. Repeat __________ times. Complete this exercise __________ times a day. Towel curls  1. Sit in a chair on a non-carpeted surface, and put your feet on the  floor. 2. Place a towel in front of your feet. If told by your health care provider, add a __________ pound weight to the end of the towel. 3. Keeping your heel on the floor, put your __________ foot on the towel. 4. Pull the towel toward you by grabbing the towel with your toes and curling them under. Keep your heel on the floor. 5. Let your toes relax. 6. Grab the towel again. Keep pulling the towel until it is completely underneath your foot. Repeat __________ times. Complete this exercise __________ times a day. Standing plantar flexion This is an exercise in which you use your toes to lift your body's weight while standing. 1. Stand with your feet shoulder-width apart. 2. Keep your weight spread evenly over the width of your feet while you rise up on your toes. Use a wall or table to steady yourself if needed, but try not to use it for support. 3. If this exercise is too easy, try these options: ? Shift your weight toward your __________ leg until you feel challenged. ? If told by your health care provider, lift your uninjured leg off the floor. 4. Hold this position for __________ seconds. Repeat __________ times. Complete this exercise __________ times a day. Tandem walking 1. Stand with one foot directly in front of the other. 2. Slowly raise your back foot up, lifting your heel before your toes, and place it directly in front of your other foot. 3. Continue to walk in this heel-to-toe way for __________ or for as long as told by your health care provider. Have a countertop or wall nearby to use if needed to keep your balance, but try not to hold onto anything for support. Repeat __________ times. Complete this exercise __________ times a day. This information is not intended to replace advice given to you by your health care provider. Make sure you discuss any questions you have with your health care provider. Document Revised: 06/11/2018 Document Reviewed: 06/13/2018 Elsevier Patient  Education  2020 [employer]  

## 2019-10-27 NOTE — Assessment & Plan Note (Signed)
Acute, ongoing.  Patient with multiple falls and denies injury with any fall but will order x-ray of ankle to rule out fracture that could be causing the pain.  Advised to rest, ice, and elevate when able.  Allergic to NSAIDs/Prednisone, no relief with Tylenol.  Will give short course of flexeril, patient advised to take 2.5mg  in the evening and NOT at the same time as Valium.  Pt request for orthotic consult to purchase compression socks that fit her feet; referral placed for DME with [facility].  May consider MRI in future if no improvement or Physical Therapy.  May also consider lumbar imaging if pain consistently radiates to hip.  Patient to call back to clinic with no improvement in symptoms or concerns.

## 2019-10-27 NOTE — Progress Notes (Signed)
BP (!) 142/83 (BP Location: Left Arm, Patient Position: Sitting, Cuff Size: Normal)   Pulse 80   Temp 98.5 F (36.9 C) (Oral)   Ht 5\' 2"  (1.575 m)   Wt 104 lb (47.2 kg)   SpO2 98%   BMI 19.02 kg/m    Subjective:    Patient ID: , female    DOB: Dec 19, 1950, 69 y.o.   MRN: [id number]  HPI: Annette Miller is a 69 y.o. female  Chief Complaint  Patient presents with  . Leg Pain    Left leg. Ongoing 2 weeks. Patient having trouble walking and has fell multiple times.  . Fall    Last fall was 2 weeks ago.  . Ankle Pain  . Foot Pain  . Medication Refill    Refill for Azelstine Nasal Spray   ANKLE PAIN Duration: months - 1 month ago Pain: yes Severity: severe 10/10 with activity; at rest 2/10 Quality:  dull and aching Location:  left ankle Bilateral:  no Onset: sudden Frequency: occasional Time of  day:   at random, especially with activity Sudden unintentional leg jerking:   no Paresthesias:   no Decreased sensation:  no Weakness:   no Insomnia:   yes, pain wakes up her Fatigue:   yes Alleviating factors: none Aggravating factors: walking on it Status: worse Treatments attempted: Ace wraps, ben gay, voltaren, asper cream, ice, heat, Tylenol  Patient reports that she falls a lot and that her balance has "been off" since 2019 when she underwent serious illness.  Her son bought her new shoes and she thinks they were too heavy and that they "stretched out the tendon" and caused "foot drop.  She denies redness, warmth, and edema to ankle.  The pain is constant and one night shot up her leg and up to her hip.  Allergies  Allergen Reactions  . Aspirin     HISTORY OF PUD, DIVERTICULITIS, BOWEL PERFORATION  . Prednisone Other (See Comments)    Reaction: violence Can not take any kind of steroids  . Codeine Diarrhea and Nausea And Vomiting  . Tape Rash    Transpore tape: redness, irritation at site  Tegaderm somewhat okay. All tapes cause itching    Outpatient Encounter Medications as of 10/27/2019  Medication Sig Note  . acetaminophen (TYLENOL) 325 MG tablet Take 650 mg by mouth every 6 (six) hours as needed for mild pain or fever.   . Ascorbic Acid (VITAMIN C) 1000 MG tablet Take 3,000 mg by mouth daily.   10/29/2019 azelastine (ASTELIN) 0.1 % nasal spray Place 2 sprays into both nostrils 2 (two) times daily. Use in each nostril as directed   . buPROPion (WELLBUTRIN SR) 150 MG 12 hr tablet Take 1 tablet (150 mg total) by mouth 2 (two) times daily.   . Calcium Carb-Cholecalciferol (OYSTER SHELL CALCIUM PLUS D PO) Take 1 tablet by mouth daily.    . diazepam (VALIUM) 5 MG tablet TAKE 1 TABLET BY MOUTH TWICE A DAY   . Docusate Sodium (COLACE PO) Take 1 Dose by mouth daily.   Marland Kitchen gabapentin (NEURONTIN) 300 MG capsule Take 1 capsule (300 mg total) by mouth 3 (three) times daily.   Marland Kitchen levothyroxine (SYNTHROID) 25 MCG tablet Take 1 tablet (25 mcg total) by mouth daily before breakfast.   . magnesium 30 MG tablet Take 30 mg by mouth daily.    . meloxicam (MOBIC) 15 MG tablet Take 1 tablet (15 mg total) by mouth daily.   Marland Kitchen  Multiple Vitamin (MULTIVITAMIN WITH MINERALS) TABS tablet Take 1 tablet by mouth daily.   . ondansetron (ZOFRAN) 4 MG tablet TAKE 1 TABLET AS NEEDED FOR NAUSEA UPTO 2 TIMES A DAY   . pantoprazole (PROTONIX) 40 MG tablet Take 1 tablet (40 mg total) by mouth 2 (two) times daily.   . rosuvastatin (CRESTOR) 10 MG tablet Take 1 tablet (10 mg total) by mouth daily.   . Selenium 200 MCG CAPS Take 200 mcg by mouth daily.    Marland Kitchen triamcinolone cream (KENALOG) 0.1 % Apply 1 application topically 2 (two) times daily. 04/03/2019: Takes as needed   . vitamin E 1000 UNIT capsule Take 1,000 Units by mouth 2 (two) times daily.   . Zinc Sulfate (ZINC 15 PO) Take 15 mg by mouth daily.    . [DISCONTINUED] azelastine (ASTELIN) 0.1 % nasal spray Place 2 sprays into both nostrils 2 (two) times daily. Use in each nostril as directed   . cyclobenzaprine (FLEXERIL) 5  MG tablet Take 0.5 tablets (2.5 mg total) by mouth 3 (three) times daily as needed for muscle spasms.   . [DISCONTINUED] fexofenadine (ALLEGRA) 180 MG tablet TAKE 1 TABLET (180 MG TOTAL) BY MOUTH DAILY. (Patient not taking: Reported on 10/27/2019)   . [DISCONTINUED] gabapentin (NEURONTIN) 600 MG tablet TAKE 0.5 TABLETS (300MG  MG TOTAL) BY MOUTH 3 (THREE) TIMES DAILY. (Patient not taking: Reported on 10/27/2019)    No facility-administered encounter medications on file as of 10/27/2019.   Patient Active Problem List   Diagnosis Date Noted  . Acute left ankle pain 10/27/2019  . Colon cancer screening   . Anastomotic stricture of colorectal region   . Benign positional vertigo 01/09/2019  . History of bowel diversion surgery   . Kidney stone 12/08/2017  . Anxiety 05/20/2017  . Allergy 05/20/2017  . H/O total hysterectomy 05/12/2016  . Hyperlipidemia 05/08/2015  . Depression 05/08/2015  . Hypothyroidism 05/08/2015   Past Medical History:  Diagnosis Date  . Allergy   . Anemia    improved since surgery in May 2019  . Anxiety   . Arthritis   . Depression   . GERD (gastroesophageal reflux disease) 2019   PUD, history of diverticulitis  . Goiter   . H/O cardiac arrest 03/2018   s/p surgery and while in hospital. no cardiac history  . Headache   . History of blood transfusion 01/2018  . Hyperlipidemia   . Hypothyroidism   . Osteoporosis    Relevant past medical, surgical, family and social history reviewed and updated as indicated. Interim medical history since our last visit reviewed. Allergies and medications reviewed and updated.  Review of Systems  Constitutional: Negative.  Negative for activity change, chills and fever.  Musculoskeletal: Positive for arthralgias (ankle pain) and gait problem. Negative for back pain, joint swelling and myalgias.  Skin: Negative.  Negative for color change, rash and wound.  Neurological: Negative for dizziness, weakness, light-headedness,  numbness and headaches.  Psychiatric/Behavioral: Negative.  Negative for confusion, hallucinations and sleep disturbance. The patient is not nervous/anxious.     Per HPI unless specifically indicated above     Objective:    BP (!) 142/83 (BP Location: Left Arm, Patient Position: Sitting, Cuff Size: Normal)   Pulse 80   Temp 98.5 F (36.9 C) (Oral)   Ht 5\' 2"  (1.575 m)   Wt 104 lb (47.2 kg)   SpO2 98%   BMI 19.02 kg/m   Wt Readings from Last 3 Encounters:  10/27/19 104 lb (47.2  kg)  06/28/19 104 lb (47.2 kg)  05/24/19 106 lb (48.1 kg)    Physical Exam Vitals and nursing note reviewed.  Constitutional:      General: She is not in acute distress.    Appearance: Normal appearance. She is not ill-appearing or toxic-appearing.  Musculoskeletal:        General: Tenderness present. No swelling or deformity. Normal range of motion.     Cervical back: Normal range of motion. No rigidity.  Skin:    General: Skin is warm and dry.     Capillary Refill: Capillary refill takes less than 2 seconds.     Coloration: Skin is not jaundiced or pale.     Findings: No bruising or erythema.  Neurological:     General: No focal deficit present.     Mental Status: She is alert and oriented to person, place, and time. Mental status is at baseline.     Motor: No weakness.     Gait: Gait abnormal.  Psychiatric:        Mood and Affect: Mood normal.        Behavior: Behavior normal.        Thought Content: Thought content normal.        Judgment: Judgment normal.       Assessment & Plan:   Problem List Items Addressed This Visit      Other   Allergy   Relevant Medications   azelastine (ASTELIN) 0.1 % nasal spray   Acute left ankle pain - Primary    Acute, ongoing.  Patient with multiple falls and denies injury with any fall but will order x-ray of ankle to rule out fracture that could be causing the pain.  Advised to rest, ice, and elevate when able.  Allergic to NSAIDs/Prednisone, no  relief with Tylenol.  Will give short course of flexeril, patient advised to take 2.5mg  in the evening and NOT at the same time as Valium.  Pt request for orthotic consult to purchase compression socks that fit her feet; referral placed for DME with [facility].  May consider MRI in future if no improvement or Physical Therapy.  May also consider lumbar imaging if pain consistently radiates to hip.  Patient to call back to clinic with no improvement in symptoms or concerns.      Relevant Medications   cyclobenzaprine (FLEXERIL) 5 MG tablet   Other Relevant Orders   DG Ankle Complete Left   Ambulatory Referral for DME       Follow up plan: Return if symptoms worsen or fail to improve.

## 2019-10-30 ENCOUNTER — Telehealth: Payer: Self-pay

## 2019-10-30 NOTE — Telephone Encounter (Signed)
PA for Cyclobenazeprine initiated and submitted via Cover My Meds. Key: Annette Miller

## 2019-10-31 ENCOUNTER — Other Ambulatory Visit: Payer: Self-pay | Admitting: Family Medicine

## 2019-10-31 NOTE — Telephone Encounter (Signed)
PA approved.

## 2019-10-31 NOTE — Telephone Encounter (Signed)
Requested Prescriptions  Pending Prescriptions Disp Refills  . meloxicam (MOBIC) 15 MG tablet [Pharmacy Med Name: MELOXICAM 15 MG TABLET] 30 tablet 0    Sig: TAKE 1 TABLET BY MOUTH EVERY DAY     Analgesics:  COX2 Inhibitors Failed - 10/31/2019  1:02 AM      Failed - HGB in normal range and within 360 days    Hemoglobin  Date Value Ref Range Status  10/10/2018 9.4 (L) 11.1 - 15.9 g/dL Final         Failed - Cr in normal range and within 360 days    Creatinine, Ser  Date Value Ref Range Status  10/10/2018 0.87 0.57 - 1.00 mg/dL Final         Passed - Patient is not pregnant      Passed - Valid encounter within last 12 months    Recent Outpatient Visits          4 days ago Acute left ankle pain   [facility] Mardene Celeste I, NP   8 months ago Acute sinusitis, recurrence not specified, unspecified location   [facility] Crissman, Redge Gainer, MD   9 months ago Depression, unspecified depression type   [facility] Crissman, Redge Gainer, MD   1 year ago Hypothyroidism, unspecified type   [facility] Crissman, Redge Gainer, MD   1 year ago Depression, unspecified depression type   [facility] Crissman, Redge Gainer, MD

## 2019-11-02 ENCOUNTER — Other Ambulatory Visit: Payer: Self-pay

## 2019-11-02 ENCOUNTER — Other Ambulatory Visit: Payer: Self-pay | Admitting: Family Medicine

## 2019-11-02 DIAGNOSIS — M25572 Pain in left ankle and joints of left foot: Secondary | ICD-10-CM

## 2019-11-02 MED ORDER — CYCLOBENZAPRINE HCL 5 MG PO TABS: 2.5000 mg | ORAL_TABLET | Freq: Three times a day (TID) | ORAL | 0 refills | Status: DC | PRN

## 2019-11-02 MED ORDER — PANTOPRAZOLE SODIUM 40 MG PO TBEC: 40.0000 mg | DELAYED_RELEASE_TABLET | Freq: Two times a day (BID) | ORAL | 1 refills | Status: DC

## 2019-11-02 NOTE — Telephone Encounter (Signed)
Refill for request for Cyclobenzaprine from visit 10/27/2019

## 2019-11-02 NOTE — Telephone Encounter (Signed)
Will refill x1, please encourage her to get ankle x-ray and ask if she would like to see PT?  Muscle relaxer is not appropriate long-term treatment for her ankle pain, unfortunately.

## 2019-11-02 NOTE — Addendum Note (Signed)
Addended by: Marshall Cork on: 11/02/2019 09:30 AM   Modules accepted: Orders

## 2019-11-02 NOTE — Telephone Encounter (Signed)
Another refill request for Pantoprazole. LOV 10/27/2019

## 2019-11-10 ENCOUNTER — Other Ambulatory Visit: Payer: Self-pay | Admitting: Family Medicine

## 2019-11-14 ENCOUNTER — Other Ambulatory Visit: Payer: Self-pay | Admitting: Family Medicine

## 2019-11-15 ENCOUNTER — Other Ambulatory Visit: Payer: Self-pay

## 2019-11-15 DIAGNOSIS — M25572 Pain in left ankle and joints of left foot: Secondary | ICD-10-CM

## 2019-11-15 NOTE — Telephone Encounter (Signed)
LOV- 1.29.2021

## 2019-11-16 ENCOUNTER — Other Ambulatory Visit: Payer: Self-pay | Admitting: Nurse Practitioner

## 2019-11-16 DIAGNOSIS — F329 Major depressive disorder, single episode, unspecified: Secondary | ICD-10-CM

## 2019-11-16 DIAGNOSIS — F32A Depression, unspecified: Secondary | ICD-10-CM

## 2019-11-16 DIAGNOSIS — M25572 Pain in left ankle and joints of left foot: Secondary | ICD-10-CM

## 2019-11-16 NOTE — Telephone Encounter (Signed)
Review for refill request for diazepam and flexeril.

## 2019-11-16 NOTE — Telephone Encounter (Signed)
Medication Refill - Medication: diazepam (VALIUM) 5 MG tablet cyclobenzaprine (FLEXERIL) 5 MG tablet   Has the patient contacted their pharmacy? Yes.   (Agent: If no, request that the patient contact the pharmacy for the refill.) (Agent: If yes, when and what did the pharmacy advise?)  Preferred Pharmacy (with phone number or street name):  [id number] - [place], [place] - [address]  [address] [place] [address]  Phone: [phone] Fax: [phone]     Agent: Please be advised that RX refills may take up to 3 business days. We ask that you follow-up with your pharmacy.

## 2019-11-17 ENCOUNTER — Other Ambulatory Visit: Payer: Self-pay

## 2019-11-17 ENCOUNTER — Telehealth: Payer: Self-pay | Admitting: Family Medicine

## 2019-11-17 DIAGNOSIS — F32A Depression, unspecified: Secondary | ICD-10-CM

## 2019-11-17 DIAGNOSIS — F329 Major depressive disorder, single episode, unspecified: Secondary | ICD-10-CM

## 2019-11-17 MED ORDER — DIAZEPAM 5 MG PO TABS: 5.0000 mg | ORAL_TABLET | Freq: Two times a day (BID) | ORAL | 0 refills | Status: DC

## 2019-11-17 NOTE — Telephone Encounter (Signed)
Patient has not been seen for a non-acute appointment since 12/2018. Will need an appointment for any refills.

## 2019-11-17 NOTE — Telephone Encounter (Signed)
Spoke with pt she stated she is only taking it before and after work when working long hours. She can not afford PT and she stated she is wearing compression hose which help some.

## 2019-11-17 NOTE — Telephone Encounter (Signed)
LOV: 10/27/2019 with Mardene Celeste, NP. NOV: 11/24/2019 with Olevia Perches, DO.  LVM requesting call back from pt to see if she has enough medication to make it to her appt.

## 2019-11-17 NOTE — Telephone Encounter (Signed)
Dawn is calling from [employer] calling to report compression socks are not done at this office. Completed at the [place]- Referrals for that office Attention [phone] [facility]

## 2019-11-17 NOTE — Telephone Encounter (Signed)
LOV- 1.29.2021

## 2019-11-17 NOTE — Telephone Encounter (Signed)
This was refilled x1 already and is not a long-term solution for her ankle.  Would the patient like a referral to PT? I would be happy to place that for her.

## 2019-11-17 NOTE — Telephone Encounter (Signed)
Appt scheduled for 11/24/19.

## 2019-11-24 ENCOUNTER — Other Ambulatory Visit: Payer: Self-pay

## 2019-11-24 ENCOUNTER — Encounter: Payer: Self-pay | Admitting: Family Medicine

## 2019-11-24 ENCOUNTER — Ambulatory Visit (INDEPENDENT_AMBULATORY_CARE_PROVIDER_SITE_OTHER): Payer: Medicare HMO | Admitting: Family Medicine

## 2019-11-24 VITALS — BP 151/88 | HR 93 | Temp 97.8°F

## 2019-11-24 DIAGNOSIS — F419 Anxiety disorder, unspecified: Secondary | ICD-10-CM

## 2019-11-24 DIAGNOSIS — G4700 Insomnia, unspecified: Secondary | ICD-10-CM | POA: Diagnosis not present

## 2019-11-24 DIAGNOSIS — E785 Hyperlipidemia, unspecified: Secondary | ICD-10-CM | POA: Diagnosis not present

## 2019-11-24 DIAGNOSIS — E039 Hypothyroidism, unspecified: Secondary | ICD-10-CM

## 2019-11-24 DIAGNOSIS — M25572 Pain in left ankle and joints of left foot: Secondary | ICD-10-CM | POA: Diagnosis not present

## 2019-11-24 DIAGNOSIS — F3342 Major depressive disorder, recurrent, in full remission: Secondary | ICD-10-CM

## 2019-11-24 DIAGNOSIS — D7589 Other specified diseases of blood and blood-forming organs: Secondary | ICD-10-CM | POA: Diagnosis not present

## 2019-11-24 DIAGNOSIS — Z720 Tobacco use: Secondary | ICD-10-CM

## 2019-11-24 DIAGNOSIS — R69 Illness, unspecified: Secondary | ICD-10-CM | POA: Diagnosis not present

## 2019-11-24 DIAGNOSIS — D519 Vitamin B12 deficiency anemia, unspecified: Secondary | ICD-10-CM | POA: Diagnosis not present

## 2019-11-24 LAB — MICROSCOPIC EXAMINATION: Bacteria, UA: NONE SEEN

## 2019-11-24 LAB — UA/M W/RFLX CULTURE, ROUTINE
Bilirubin, UA: NEGATIVE
Glucose, UA: NEGATIVE
Ketones, UA: NEGATIVE
Leukocytes,UA: NEGATIVE
Nitrite, UA: NEGATIVE
Specific Gravity, UA: >1.03 — ABNORMAL HIGH (ref 1.005–1.030)
Urobilinogen, Ur: 0.2 mg/dL (ref 0.2–1.0)
pH, UA: 5 (ref 5.0–7.5)

## 2019-11-24 MED ORDER — ROSUVASTATIN CALCIUM 10 MG PO TABS: 10.0000 mg | ORAL_TABLET | Freq: Every day | ORAL | 1 refills | Status: DC

## 2019-11-24 MED ORDER — BUPROPION HCL ER (SR) 150 MG PO TB12: 150.0000 mg | ORAL_TABLET | Freq: Two times a day (BID) | ORAL | 2 refills | Status: DC

## 2019-11-24 MED ORDER — PANTOPRAZOLE SODIUM 40 MG PO TBEC: 40.0000 mg | DELAYED_RELEASE_TABLET | Freq: Two times a day (BID) | ORAL | 1 refills | Status: DC

## 2019-11-24 MED ORDER — CYCLOBENZAPRINE HCL 5 MG PO TABS: 2.5000 mg | ORAL_TABLET | Freq: Two times a day (BID) | ORAL | 1 refills | Status: DC | PRN

## 2019-11-24 MED ORDER — TRAZODONE HCL 50 MG PO TABS: 25.0000 mg | ORAL_TABLET | Freq: Every evening | ORAL | 3 refills | Status: DC | PRN

## 2019-11-24 MED ORDER — GABAPENTIN 300 MG PO CAPS: 300.0000 mg | ORAL_CAPSULE | Freq: Three times a day (TID) | ORAL | 3 refills | Status: DC

## 2019-11-24 NOTE — Assessment & Plan Note (Signed)
Under good control on current regimen. Continue current regimen. Continue to monitor. Call with any concerns. Refills given. Not taking her valium for anxiety- only sleep. Call with any concerns.

## 2019-11-24 NOTE — Assessment & Plan Note (Signed)
Under good control on current regimen. Continue current regimen. Continue to monitor. Call with any concerns. Refills given. Labs drawn today.   

## 2019-11-24 NOTE — Assessment & Plan Note (Signed)
Discussed dangers of valium. Has never tried anything else. Will try her on trazodone and recheck 2 weeks. Call with any concerns. Continue to monitor.

## 2019-11-24 NOTE — Progress Notes (Signed)
BP (!) 151/88 (BP Location: Left Arm, Patient Position: Sitting, Cuff Size: Small)   Pulse 93   Temp 97.8 F (36.6 C) (Oral)   SpO2 100%    Subjective:    Patient ID: Annette Miller, female    DOB: [birth year], 69 y.o.   MRN: [id number]  HPI: Annette Miller is a 69 y.o. female  Chief Complaint  Patient presents with  . Medication Refill    Flexeril, Valium   DEPRESSION/ANXIETY- Has been on her valium for years. Only taking the valium for sleep. Has never tried anything else for sleep Mood status: stable Satisfied with current treatment?: yes Symptom severity: mild  Duration of current treatment : chronic Side effects: no Medication compliance: excellent compliance Psychotherapy/counseling: no  Previous psychiatric medications: wellbutrin, valium Depressed mood: no Anxious mood: yes Anhedonia: no Significant weight loss or gain: no Insomnia: yes hard to fall asleep Fatigue: yes Feelings of worthlessness or guilt: no Impaired concentration/indecisiveness: no Suicidal ideations: no Hopelessness: no Crying spells: no Depression screen [facility] 2/9 11/24/2019 04/03/2019 06/30/2018 12/15/2017 05/20/2017  Decreased Interest 0 0 1 0 0  Down, Depressed, Hopeless 0 0 3 0 0  PHQ - 2 Score 0 0 4 0 0  Altered sleeping 3 - 3 - -  Tired, decreased energy 2 - 1 - -  Change in appetite 0 - 0 - -  Feeling bad or failure about yourself  0 - 1 - -  Trouble concentrating 0 - 0 - -  Moving slowly or fidgety/restless 0 - 2 - -  Suicidal thoughts 0 - 0 - -  PHQ-9 Score 5 - 11 - -  Difficult doing work/chores Not difficult at all - Not difficult at all - -   GAD 7 : Generalized Anxiety Score 11/24/2019  Nervous, Anxious, on Edge 2  Control/stop worrying 1  Worry too much - different things 2  Trouble relaxing 2  Restless 3  Easily annoyed or irritable 1  Afraid - awful might happen 0  Total GAD 7 Score 11  Anxiety Difficulty Somewhat difficult    HYPERLIPIDEMIA Hyperlipidemia status:  excellent compliance Satisfied with current treatment?  yes Side effects:  no Medication compliance: excellent compliance Past cholesterol meds: rosuvastatin (crestor) Supplements: none Aspirin:  no The 10-year ASCVD risk score Denman George DC Jr., et al., 2013) is: 16.7%   Values used to calculate the score:     Age: 30 years     Sex: Female     Is Non-Hispanic African American: No     Diabetic: No     Tobacco smoker: Yes     Systolic Blood Pressure: 151 mmHg     Is BP treated: No     HDL Cholesterol: 46 mg/dL     Total Cholesterol: 158 mg/dL Chest pain:  no Coronary artery disease:  no  HYPOTHYROIDISM Thyroid control status:stable Satisfied with current treatment? yes Medication side effects: no Medication compliance: excellent compliance Etiology of hypothyroidism:  Recent dose adjustment:no Fatigue: no Cold intolerance: no Heat intolerance: no Weight gain: no Weight loss: no Constipation: no Diarrhea/loose stools: no Palpitations: no Lower extremity edema: no Anxiety/depressed mood: no   Relevant past medical, surgical, family and social history reviewed and updated as indicated. Interim medical history since our last visit reviewed. Allergies and medications reviewed and updated.  Review of Systems  Constitutional: Negative.   Respiratory: Negative.   Cardiovascular: Negative.   Gastrointestinal: Negative.   Musculoskeletal: Positive for arthralgias and myalgias. Negative for back  pain, gait problem, joint swelling, neck pain and neck stiffness.  Skin: Negative.   Neurological: Positive for weakness. Negative for dizziness, tremors, seizures, syncope, facial asymmetry, speech difficulty, light-headedness, numbness and headaches.  Psychiatric/Behavioral: Positive for sleep disturbance. Negative for agitation, behavioral problems, confusion, decreased concentration, dysphoric mood, hallucinations, self-injury and suicidal ideas. The patient is nervous/anxious. The  patient is not hyperactive.     Per HPI unless specifically indicated above     Objective:    BP (!) 151/88 (BP Location: Left Arm, Patient Position: Sitting, Cuff Size: Small)   Pulse 93   Temp 97.8 F (36.6 C) (Oral)   SpO2 100%   Wt Readings from Last 3 Encounters:  10/27/19 104 lb (47.2 kg)  06/28/19 104 lb (47.2 kg)  05/24/19 106 lb (48.1 kg)    Physical Exam Vitals and nursing note reviewed.  Constitutional:      General: She is not in acute distress.    Appearance: Normal appearance. She is not ill-appearing, toxic-appearing or diaphoretic.  HENT:     Head: Normocephalic and atraumatic.     Right Ear: External ear normal.     Left Ear: External ear normal.     Nose: Nose normal.     Mouth/Throat:     Mouth: Mucous membranes are moist.     Pharynx: Oropharynx is clear.  Eyes:     General: No scleral icterus.       Right eye: No discharge.        Left eye: No discharge.     Extraocular Movements: Extraocular movements intact.     Conjunctiva/sclera: Conjunctivae normal.     Pupils: Pupils are equal, round, and reactive to light.  Cardiovascular:     Rate and Rhythm: Normal rate and regular rhythm.     Pulses: Normal pulses.     Heart sounds: Normal heart sounds. No murmur. No friction rub. No gallop.   Pulmonary:     Effort: Pulmonary effort is normal. No respiratory distress.     Breath sounds: Normal breath sounds. No stridor. No wheezing, rhonchi or rales.  Chest:     Chest wall: No tenderness.  Musculoskeletal:        General: Normal range of motion.     Cervical back: Normal range of motion and neck supple.  Skin:    General: Skin is warm and dry.     Capillary Refill: Capillary refill takes less than 2 seconds.     Coloration: Skin is not jaundiced or pale.     Findings: No bruising, erythema, lesion or rash.  Neurological:     General: No focal deficit present.     Mental Status: She is alert and oriented to person, place, and time. Mental status  is at baseline.  Psychiatric:        Mood and Affect: Mood normal.        Behavior: Behavior normal.        Thought Content: Thought content normal.        Judgment: Judgment normal.     Results for orders placed or performed during the hospital encounter of 07/17/19  SARS CORONAVIRUS 2 (TAT 6-24 HRS) Nasopharyngeal Nasopharyngeal Swab   Specimen: Nasopharyngeal Swab  Result Value Ref Range   SARS Coronavirus 2 NEGATIVE NEGATIVE      Assessment & Plan:   Problem List Items Addressed This Visit      Endocrine   Hypothyroidism    Feeling well. Rechecking labs today. Await results. Call  with any concerns. Will treat as needed.       Relevant Orders   CBC with Differential/Platelet   TSH     Other   Hyperlipidemia    Under good control on current regimen. Continue current regimen. Continue to monitor. Call with any concerns. Refills given. Labs drawn today.       Relevant Medications   rosuvastatin (CRESTOR) 10 MG tablet   Other Relevant Orders   CBC with Differential/Platelet   Comprehensive metabolic panel   Lipid Panel w/o Chol/HDL Ratio   Depression    Under good control on current regimen. Continue current regimen. Continue to monitor. Call with any concerns. Refills given. Not taking her valium for anxiety- only sleep. Call with any concerns.        Relevant Medications   traZODone (DESYREL) 50 MG tablet   buPROPion (WELLBUTRIN SR) 150 MG 12 hr tablet   Other Relevant Orders   CBC with Differential/Platelet   Anxiety    Under good control on current regimen. Continue current regimen. Continue to monitor. Call with any concerns. Refills given. Not taking her valium for anxiety- only sleep. Call with any concerns.        Relevant Medications   traZODone (DESYREL) 50 MG tablet   buPROPion (WELLBUTRIN SR) 150 MG 12 hr tablet   Acute left ankle pain    Will continue her flexeril for now. Continue to monitor. Call with any concerns.       Relevant  Medications   cyclobenzaprine (FLEXERIL) 5 MG tablet   Insomnia - Primary    Discussed dangers of valium. Has never tried anything else. Will try her on trazodone and recheck 2 weeks. Call with any concerns. Continue to monitor.        Other Visit Diagnoses    Tobacco abuse       Encouraged to quit. Call with any concerns.    Relevant Orders   UA/M w/rflx Culture, Routine       Follow up plan: Return in about 2 weeks (around 12/08/2019).

## 2019-11-24 NOTE — Assessment & Plan Note (Signed)
Feeling well. Rechecking labs today. Await results. Call with any concerns. Will treat as needed.

## 2019-11-24 NOTE — Assessment & Plan Note (Signed)
Under good control on current regimen. Continue current regimen. Continue to monitor. Call with any concerns. Refills given. Not taking her valium for anxiety- only sleep. Call with any concerns.   

## 2019-11-24 NOTE — Assessment & Plan Note (Signed)
Will continue her flexeril for now. Continue to monitor. Call with any concerns.

## 2019-11-25 LAB — COMPREHENSIVE METABOLIC PANEL
ALT: 7 IU/L (ref 0–32)
AST: 12 IU/L (ref 0–40)
Albumin/Globulin Ratio: 1.2 (ref 1.2–2.2)
Albumin: 3.8 g/dL (ref 3.8–4.8)
Alkaline Phosphatase: 123 IU/L — ABNORMAL HIGH (ref 39–117)
BUN/Creatinine Ratio: 21 (ref 12–28)
BUN: 24 mg/dL (ref 8–27)
Bilirubin Total: <0.2 mg/dL (ref 0.0–1.2)
CO2: 25 mmol/L (ref 20–29)
Calcium: 9.2 mg/dL (ref 8.7–10.3)
Chloride: 104 mmol/L (ref 96–106)
Creatinine, Ser: 1.14 mg/dL — ABNORMAL HIGH (ref 0.57–1.00)
GFR calc Af Amer: 57 mL/min/{1.73_m2} — ABNORMAL LOW (ref >59)
GFR calc non Af Amer: 50 mL/min/{1.73_m2} — ABNORMAL LOW (ref >59)
Globulin, Total: 3.3 g/dL (ref 1.5–4.5)
Glucose: 112 mg/dL — ABNORMAL HIGH (ref 65–99)
Potassium: 4.8 mmol/L (ref 3.5–5.2)
Sodium: 141 mmol/L (ref 134–144)
Total Protein: 7.1 g/dL (ref 6.0–8.5)

## 2019-11-25 LAB — CBC WITH DIFFERENTIAL/PLATELET
Basophils Absolute: 0 10*3/uL (ref 0.0–0.2)
Basos: 1 %
EOS (ABSOLUTE): 0.4 10*3/uL (ref 0.0–0.4)
Eos: 6 %
Hematocrit: 33.4 % — ABNORMAL LOW (ref 34.0–46.6)
Hemoglobin: 11.2 g/dL (ref 11.1–15.9)
Immature Grans (Abs): 0 10*3/uL (ref 0.0–0.1)
Immature Granulocytes: 0 %
Lymphocytes Absolute: 1.5 10*3/uL (ref 0.7–3.1)
Lymphs: 19 %
MCH: 33.7 pg — ABNORMAL HIGH (ref 26.6–33.0)
MCHC: 33.5 g/dL (ref 31.5–35.7)
MCV: 101 fL — ABNORMAL HIGH (ref 79–97)
Monocytes Absolute: 0.5 10*3/uL (ref 0.1–0.9)
Monocytes: 6 %
Neutrophils Absolute: 5.4 10*3/uL (ref 1.4–7.0)
Neutrophils: 68 %
Platelets: 339 10*3/uL (ref 150–450)
RBC: 3.32 x10E6/uL — ABNORMAL LOW (ref 3.77–5.28)
RDW: 11.8 % (ref 11.7–15.4)
WBC: 7.9 10*3/uL (ref 3.4–10.8)

## 2019-11-25 LAB — LIPID PANEL W/O CHOL/HDL RATIO
Cholesterol, Total: 159 mg/dL (ref 100–199)
HDL: 52 mg/dL (ref >39)
LDL Chol Calc (NIH): 88 mg/dL (ref 0–99)
Triglycerides: 105 mg/dL (ref 0–149)
VLDL Cholesterol Cal: 19 mg/dL (ref 5–40)

## 2019-11-25 LAB — TSH: TSH: 1.32 u[IU]/mL (ref 0.450–4.500)

## 2019-11-27 ENCOUNTER — Other Ambulatory Visit: Payer: Self-pay | Admitting: Family Medicine

## 2019-11-27 DIAGNOSIS — D7589 Other specified diseases of blood and blood-forming organs: Secondary | ICD-10-CM

## 2019-11-27 DIAGNOSIS — N289 Disorder of kidney and ureter, unspecified: Secondary | ICD-10-CM

## 2019-11-27 MED ORDER — LEVOTHYROXINE SODIUM 25 MCG PO TABS: ORAL_TABLET | ORAL | 3 refills | Status: DC

## 2019-11-28 LAB — VITAMIN B12: Vitamin B-12: >2000 pg/mL — ABNORMAL HIGH (ref 232–1245)

## 2019-11-28 LAB — SPECIMEN STATUS REPORT

## 2019-12-08 ENCOUNTER — Encounter: Payer: Self-pay | Admitting: Family Medicine

## 2019-12-08 ENCOUNTER — Ambulatory Visit (INDEPENDENT_AMBULATORY_CARE_PROVIDER_SITE_OTHER): Payer: Medicare HMO | Admitting: Family Medicine

## 2019-12-08 DIAGNOSIS — F5104 Psychophysiologic insomnia: Secondary | ICD-10-CM | POA: Diagnosis not present

## 2019-12-08 DIAGNOSIS — R69 Illness, unspecified: Secondary | ICD-10-CM | POA: Diagnosis not present

## 2019-12-08 MED ORDER — TRAZODONE HCL 100 MG PO TABS: 50.0000 mg | ORAL_TABLET | Freq: Every evening | ORAL | 3 refills | Status: DC | PRN

## 2019-12-08 NOTE — Progress Notes (Signed)
There were no vitals taken for this visit.   Subjective:    Patient ID: Annette Miller, female    DOB: 16-May-1951, 69 y.o.   MRN: [id number]  HPI: Annette Miller is a 69 y.o. female  Chief Complaint  Patient presents with  . Insomnia   INSOMNIA- notes that she is sleeping OK. She feels like the trazodone works, but it's not knocking her out like the valium. Duration: chronic Satisfied with sleep quality: yes Difficulty falling asleep: yes Difficulty staying asleep: no Waking a few hours after sleep onset: no Early morning awakenings: no Daytime hypersomnolence: no Wakes feeling refreshed: no Good sleep hygiene: no Apnea: no Snoring: no Depressed/anxious mood: yes Recent stress: yes Restless legs/nocturnal leg cramps: no Chronic pain/arthritis: no History of sleep study: no Treatments attempted: trazodone, valium, melatonin, uinsom and benadryl   Relevant past medical, surgical, family and social history reviewed and updated as indicated. Interim medical history since our last visit reviewed. Allergies and medications reviewed and updated.  Review of Systems  Constitutional: Negative.   Respiratory: Negative.   Cardiovascular: Negative.   Gastrointestinal: Negative.   Musculoskeletal: Positive for myalgias. Negative for arthralgias, back pain, gait problem, joint swelling, neck pain and neck stiffness.  Skin: Negative.   Psychiatric/Behavioral: Positive for sleep disturbance. Negative for agitation, behavioral problems, confusion, decreased concentration, dysphoric mood, hallucinations, self-injury and suicidal ideas. The patient is nervous/anxious. The patient is not hyperactive.     Per HPI unless specifically indicated above     Objective:    There were no vitals taken for this visit.  Wt Readings from Last 3 Encounters:  10/27/19 104 lb (47.2 kg)  06/28/19 104 lb (47.2 kg)  05/24/19 106 lb (48.1 kg)    Physical Exam Vitals and nursing note reviewed.    Pulmonary:     Effort: Pulmonary effort is normal. No respiratory distress.     Comments: Speaking in full sentences Neurological:     Mental Status: She is alert.  Psychiatric:        Mood and Affect: Mood normal.        Behavior: Behavior normal.        Thought Content: Thought content normal.        Judgment: Judgment normal.     Results for orders placed or performed in visit on 11/24/19  Microscopic Examination   URINE  Result Value Ref Range   WBC, UA 0-5 0 - 5 /hpf   RBC 3-10 (A) 0 - 2 /hpf   Epithelial Cells (non renal) 0-10 0 - 10 /hpf   Casts Present None seen /lpf   Cast Type Hyaline casts N/A   Mucus, UA Present Not Estab.   Bacteria, UA None seen None seen/Few  CBC with Differential/Platelet  Result Value Ref Range   WBC 7.9 3.4 - 10.8 x10E3/uL   RBC 3.32 (L) 3.77 - 5.28 x10E6/uL   Hemoglobin 11.2 11.1 - 15.9 g/dL   Hematocrit 33.4 (L) 34.0 - 46.6 %   MCV 101 (H) 79 - 97 fL   MCH 33.7 (H) 26.6 - 33.0 pg   MCHC 33.5 31.5 - 35.7 g/dL   RDW 11.8 11.7 - 15.4 %   Platelets 339 150 - 450 x10E3/uL   Neutrophils 68 Not Estab. %   Lymphs 19 Not Estab. %   Monocytes 6 Not Estab. %   Eos 6 Not Estab. %   Basos 1 Not Estab. %   Neutrophils Absolute 5.4 1.4 - 7.0  x10E3/uL   Lymphocytes Absolute 1.5 0.7 - 3.1 x10E3/uL   Monocytes Absolute 0.5 0.1 - 0.9 x10E3/uL   EOS (ABSOLUTE) 0.4 0.0 - 0.4 x10E3/uL   Basophils Absolute 0.0 0.0 - 0.2 x10E3/uL   Immature Granulocytes 0 Not Estab. %   Immature Grans (Abs) 0.0 0.0 - 0.1 x10E3/uL  Comprehensive metabolic panel  Result Value Ref Range   Glucose 112 (H) 65 - 99 mg/dL   BUN 24 8 - 27 mg/dL   Creatinine, Ser 7.67 (H) 0.57 - 1.00 mg/dL   GFR calc non Af Amer 50 (L) >59 mL/min/1.73   GFR calc Af Amer 57 (L) >59 mL/min/1.73   BUN/Creatinine Ratio 21 12 - 28   Sodium 141 134 - 144 mmol/L   Potassium 4.8 3.5 - 5.2 mmol/L   Chloride 104 96 - 106 mmol/L   CO2 25 20 - 29 mmol/L   Calcium 9.2 8.7 - 10.3 mg/dL   Total  Protein 7.1 6.0 - 8.5 g/dL   Albumin 3.8 3.8 - 4.8 g/dL   Globulin, Total 3.3 1.5 - 4.5 g/dL   Albumin/Globulin Ratio 1.2 1.2 - 2.2   Bilirubin Total <0.2 0.0 - 1.2 mg/dL   Alkaline Phosphatase 123 (H) 39 - 117 IU/L   AST 12 0 - 40 IU/L   ALT 7 0 - 32 IU/L  Lipid Panel w/o Chol/HDL Ratio  Result Value Ref Range   Cholesterol, Total 159 100 - 199 mg/dL   Triglycerides 341 0 - 149 mg/dL   HDL 52 >93 mg/dL   VLDL Cholesterol Cal 19 5 - 40 mg/dL   LDL Chol Calc (NIH) 88 0 - 99 mg/dL  TSH  Result Value Ref Range   TSH 1.320 0.450 - 4.500 uIU/mL  UA/M w/rflx Culture, Routine   Specimen: Urine   URINE  Result Value Ref Range   Specific Gravity, UA >1.030 (H) 1.005 - 1.030   pH, UA 5.0 5.0 - 7.5   Color, UA Yellow Yellow   Appearance Ur Clear Clear   Leukocytes,UA Negative Negative   Protein,UA Trace (A) Negative/Trace   Glucose, UA Negative Negative   Ketones, UA Negative Negative   RBC, UA 2+ (A) Negative   Bilirubin, UA Negative Negative   Urobilinogen, Ur 0.2 0.2 - 1.0 mg/dL   Nitrite, UA Negative Negative   Microscopic Examination See below:   Vitamin B12  Result Value Ref Range   Vitamin B-12 >2000 (H) 232 - 1245 pg/mL  Specimen status report  Result Value Ref Range   specimen status report Comment       Assessment & Plan:   Problem List Items Addressed This Visit      Other   Insomnia    Tolerating the trazodone well. Fells like it works as well as the valium. Still having a little trouble sleeping- will increase to 50-100mg  qHS. Recheck at phyiscal.          Follow up plan: Return July Physical/wellness.   . This visit was completed via telephone due to the restrictions of the COVID-19 pandemic. All issues as above were discussed and addressed but no physical exam was performed. If it was felt that the patient should be evaluated in the office, they were directed there. The patient verbally consented to this visit. Patient was unable to complete an  audio/visual visit due to Lack of equipment. Due to the catastrophic nature of the COVID-19 pandemic, this visit was done through audio contact only. . Location of the patient:  home . Location of the provider: work . Those involved with this call:  . Provider: Olevia Perches, DO . CMA: Tiffany Reel, CMA . Front Desk/Registration: Adela Ports  . Time spent on call: 15 minutes with patient face to face via video conference. More than 50% of this time was spent in counseling and coordination of care. 23 minutes total spent in review of patient's record and preparation of their chart.

## 2019-12-08 NOTE — Assessment & Plan Note (Signed)
Tolerating the trazodone well. Fells like it works as well as the valium. Still having a little trouble sleeping- will increase to 50-100mg  qHS. Recheck at phyiscal.

## 2019-12-11 ENCOUNTER — Other Ambulatory Visit: Payer: Self-pay | Admitting: Gastroenterology

## 2019-12-11 ENCOUNTER — Other Ambulatory Visit: Payer: Self-pay | Admitting: Family Medicine

## 2019-12-12 ENCOUNTER — Telehealth: Payer: Self-pay | Admitting: Family Medicine

## 2019-12-12 NOTE — Telephone Encounter (Signed)
-----   Message from Dorcas Carrow, [place] sent at 12/08/2019 11:38 AM EST ----- July- phyiscal/wellness

## 2019-12-12 NOTE — Telephone Encounter (Signed)
ok 

## 2019-12-12 NOTE — Telephone Encounter (Signed)
LVM for pt to call back. Also mailed letter and sent it through [facility].

## 2019-12-19 ENCOUNTER — Encounter: Payer: Self-pay | Admitting: Family Medicine

## 2019-12-19 ENCOUNTER — Encounter: Payer: Medicare HMO | Admitting: Family Medicine

## 2019-12-19 ENCOUNTER — Telehealth (INDEPENDENT_AMBULATORY_CARE_PROVIDER_SITE_OTHER): Payer: Medicare HMO | Admitting: Family Medicine

## 2019-12-19 VITALS — HR 86

## 2019-12-19 DIAGNOSIS — J069 Acute upper respiratory infection, unspecified: Secondary | ICD-10-CM

## 2019-12-19 MED ORDER — FEXOFENADINE HCL 180 MG PO TABS: 180.0000 mg | ORAL_TABLET | Freq: Every day | ORAL | 3 refills | Status: DC

## 2019-12-19 MED ORDER — ALBUTEROL SULFATE HFA 108 (90 BASE) MCG/ACT IN AERS: 2.0000 | INHALATION_SPRAY | Freq: Four times a day (QID) | RESPIRATORY_TRACT | 3 refills | Status: DC | PRN

## 2019-12-19 NOTE — Progress Notes (Signed)
Pulse 86    Subjective:    Patient ID: Annette Miller, female    DOB: [birth year], 69 y.o.   MRN: [id number]  HPI: Annette Miller is a 69 y.o. female  Chief Complaint  Patient presents with  . Cough    Started becoming productive on Sunday, was clear and now it is yelloe  . Sore Throat  . Headache  . Ear Pain   UPPER RESPIRATORY TRACT INFECTION- tested yesterday and was negative for COVID Duration: 4 days Worst symptom: cough, sore throat and ears aching Fever: no Cough: yes Shortness of breath: no Wheezing: yes on Saturday Chest pain: no Chest tightness: no Chest congestion: yes Nasal congestion: yes Runny nose: yes Post nasal drip: yes Sneezing: yes Sore throat: yes Swollen glands: yes Sinus pressure: no Headache: yes Face pain: no Toothache: no Ear pain: no  Ear pressure: yes  Eyes red/itching:no Eye drainage/crusting: no  Vomiting: no Rash: no Fatigue: yes Sick contacts: no Strep contacts: no  Context: better Recurrent sinusitis: no Relief with OTC cold/cough medications: no  Treatments attempted: nyquil    Relevant past medical, surgical, family and social history reviewed and updated as indicated. Interim medical history since our last visit reviewed. Allergies and medications reviewed and updated.  Review of Systems  Constitutional: Positive for fatigue. Negative for activity change, appetite change, chills, diaphoresis, fever and unexpected weight change.  HENT: Positive for congestion, postnasal drip, rhinorrhea and sore throat. Negative for dental problem, drooling, ear discharge, ear pain, facial swelling, hearing loss, mouth sores, nosebleeds, sinus pressure, sinus pain, sneezing, tinnitus, trouble swallowing and voice change.   Eyes: Negative.   Respiratory: Positive for cough and wheezing. Negative for apnea, choking, chest tightness, shortness of breath and stridor.   Cardiovascular: Negative.   Gastrointestinal: Negative.     Neurological: Negative.   Psychiatric/Behavioral: Negative.     Per HPI unless specifically indicated above     Objective:    Pulse 86   Wt Readings from Last 3 Encounters:  10/27/19 104 lb (47.2 kg)  06/28/19 104 lb (47.2 kg)  05/24/19 106 lb (48.1 kg)    Physical Exam Vitals and nursing note reviewed.  Pulmonary:     Effort: Pulmonary effort is normal. No respiratory distress.     Comments: Speaking in full sentences Neurological:     Mental Status: She is alert.  Psychiatric:        Mood and Affect: Mood normal.        Behavior: Behavior normal.        Thought Content: Thought content normal.        Judgment: Judgment normal.     Results for orders placed or performed in visit on 11/24/19  Microscopic Examination   URINE  Result Value Ref Range   WBC, UA 0-5 0 - 5 /hpf   RBC 3-10 (A) 0 - 2 /hpf   Epithelial Cells (non renal) 0-10 0 - 10 /hpf   Casts Present None seen /lpf   Cast Type Hyaline casts N/A   Mucus, UA Present Not Estab.   Bacteria, UA None seen None seen/Few  CBC with Differential/Platelet  Result Value Ref Range   WBC 7.9 3.4 - 10.8 x10E3/uL   RBC 3.32 (L) 3.77 - 5.28 x10E6/uL   Hemoglobin 11.2 11.1 - 15.9 g/dL   Hematocrit 67.3 (L) 41.9 - 46.6 %   MCV 101 (H) 79 - 97 fL   MCH 33.7 (H) 26.6 - 33.0 pg  MCHC 33.5 31.5 - 35.7 g/dL   RDW 11.8 11.7 - 15.4 %   Platelets 339 150 - 450 x10E3/uL   Neutrophils 68 Not Estab. %   Lymphs 19 Not Estab. %   Monocytes 6 Not Estab. %   Eos 6 Not Estab. %   Basos 1 Not Estab. %   Neutrophils Absolute 5.4 1.4 - 7.0 x10E3/uL   Lymphocytes Absolute 1.5 0.7 - 3.1 x10E3/uL   Monocytes Absolute 0.5 0.1 - 0.9 x10E3/uL   EOS (ABSOLUTE) 0.4 0.0 - 0.4 x10E3/uL   Basophils Absolute 0.0 0.0 - 0.2 x10E3/uL   Immature Granulocytes 0 Not Estab. %   Immature Grans (Abs) 0.0 0.0 - 0.1 x10E3/uL  Comprehensive metabolic panel  Result Value Ref Range   Glucose 112 (H) 65 - 99 mg/dL   BUN 24 8 - 27 mg/dL   Creatinine,  Ser 1.14 (H) 0.57 - 1.00 mg/dL   GFR calc non Af Amer 50 (L) >59 mL/min/1.73   GFR calc Af Amer 57 (L) >59 mL/min/1.73   BUN/Creatinine Ratio 21 12 - 28   Sodium 141 134 - 144 mmol/L   Potassium 4.8 3.5 - 5.2 mmol/L   Chloride 104 96 - 106 mmol/L   CO2 25 20 - 29 mmol/L   Calcium 9.2 8.7 - 10.3 mg/dL   Total Protein 7.1 6.0 - 8.5 g/dL   Albumin 3.8 3.8 - 4.8 g/dL   Globulin, Total 3.3 1.5 - 4.5 g/dL   Albumin/Globulin Ratio 1.2 1.2 - 2.2   Bilirubin Total <0.2 0.0 - 1.2 mg/dL   Alkaline Phosphatase 123 (H) 39 - 117 IU/L   AST 12 0 - 40 IU/L   ALT 7 0 - 32 IU/L  Lipid Panel w/o Chol/HDL Ratio  Result Value Ref Range   Cholesterol, Total 159 100 - 199 mg/dL   Triglycerides 105 0 - 149 mg/dL   HDL 52 >39 mg/dL   VLDL Cholesterol Cal 19 5 - 40 mg/dL   LDL Chol Calc (NIH) 88 0 - 99 mg/dL  TSH  Result Value Ref Range   TSH 1.320 0.450 - 4.500 uIU/mL  UA/M w/rflx Culture, Routine   Specimen: Urine   URINE  Result Value Ref Range   Specific Gravity, UA >1.030 (H) 1.005 - 1.030   pH, UA 5.0 5.0 - 7.5   Color, UA Yellow Yellow   Appearance Ur Clear Clear   Leukocytes,UA Negative Negative   Protein,UA Trace (A) Negative/Trace   Glucose, UA Negative Negative   Ketones, UA Negative Negative   RBC, UA 2+ (A) Negative   Bilirubin, UA Negative Negative   Urobilinogen, Ur 0.2 0.2 - 1.0 mg/dL   Nitrite, UA Negative Negative   Microscopic Examination See below:   Vitamin B12  Result Value Ref Range   Vitamin B-12 >2000 (H) 232 - 1245 pg/mL  Specimen status report  Result Value Ref Range   specimen status report Comment       Assessment & Plan:   Problem List Items Addressed This Visit    None    Visit Diagnoses    Upper respiratory tract infection, unspecified type    -  Primary   Tested negative for covid. Will treat with albuterol and allegra. If not better by Friday (3/26) will call in azithromycin. Bad reaction to steroids.        Follow up plan: Return if symptoms  worsen or fail to improve.    . This visit was completed via telephone due  to the restrictions of the COVID-19 pandemic. All issues as above were discussed and addressed but no physical exam was performed. If it was felt that the patient should be evaluated in the office, they were directed there. The patient verbally consented to this visit. Patient was unable to complete an audio/visual visit due to Lack of equipment. Due to the catastrophic nature of the COVID-19 pandemic, this visit was done through audio contact only. . Location of the patient: home . Location of the provider: work . Those involved with this call:  . Provider: Olevia Perches, DO . CMA: Tiffany Reel, CMA . Front Desk/Registration: Adela Ports  . Time spent on call: 15 minutes on the phone discussing health concerns. 23 minutes total spent in review of patient's record and preparation of their chart.

## 2019-12-21 ENCOUNTER — Telehealth: Payer: Self-pay

## 2019-12-21 MED ORDER — AZITHROMYCIN 250 MG PO TABS: ORAL_TABLET | ORAL | 0 refills | Status: DC

## 2019-12-21 NOTE — Telephone Encounter (Signed)
Can an antibiotic be called in for patient? Copied from CRM [id number]. Topic: General - Inquiry >> Dec 21, 2019  9:42 AM Deborha Payment wrote: Reason for CRM: Patient states she is still have ear pain. Esp in right ear.  Patient throat is still swollen. Patient is requesting for PCP to prescribe her an antibiotic. PHARMACY [id number] - [place], [place] - [address]  Phone:  [phone] Fax:  [phone]   Patient call back # [phone] >> Dec 21, 2019 10:02 AM Adela Ports M wrote: Video visit comp 03/23

## 2020-01-12 ENCOUNTER — Other Ambulatory Visit: Payer: Self-pay | Admitting: Family Medicine

## 2020-01-18 ENCOUNTER — Other Ambulatory Visit: Payer: Self-pay | Admitting: Family Medicine

## 2020-01-18 DIAGNOSIS — M25572 Pain in left ankle and joints of left foot: Secondary | ICD-10-CM

## 2020-01-18 NOTE — Telephone Encounter (Signed)
Requested medication (s) are due for refill today: yes  Requested medication (s) are on the active medication list: yes  Last refill: 12/21/2019  Future visit scheduled: no  Notes to clinic:  this refill cannot be delegated    Requested Prescriptions  Pending Prescriptions Disp Refills   cyclobenzaprine (FLEXERIL) 5 MG tablet [Pharmacy Med Name: CYCLOBENZAPRINE 5 MG TABLET] 30 tablet 1    Sig: TAKE 1/2 OF A TABLET BY MOUTH TWICE A DAY AS NEEDED FOR MUSCLE SPASMS      Not Delegated - Analgesics:  Muscle Relaxants Failed - 01/18/2020  7:15 AM      Failed - This refill cannot be delegated      Passed - Valid encounter within last 6 months    Recent Outpatient Visits           1 month ago Upper respiratory tract infection, unspecified type   [facility] [place], Megan P, DO   1 month ago Psychophysiological insomnia   [facility] [place], [place], DO   1 month ago Insomnia, unspecified type   [facility], Megan P, DO   2 months ago Acute left ankle pain   [facility] Mardene Celeste I, NP   11 months ago Acute sinusitis, recurrence not specified, unspecified location   [facility], Redge Gainer, MD

## 2020-01-18 NOTE — Telephone Encounter (Signed)
Last routine visit was 11/24/19

## 2020-01-22 ENCOUNTER — Other Ambulatory Visit: Payer: Self-pay | Admitting: Gastroenterology

## 2020-01-22 NOTE — Telephone Encounter (Signed)
LVM for pt to call back.

## 2020-01-29 ENCOUNTER — Other Ambulatory Visit: Payer: Self-pay | Admitting: Family Medicine

## 2020-01-30 ENCOUNTER — Other Ambulatory Visit: Payer: Self-pay | Admitting: Family Medicine

## 2020-02-04 ENCOUNTER — Other Ambulatory Visit: Payer: Self-pay | Admitting: Family Medicine

## 2020-02-05 ENCOUNTER — Telehealth: Payer: Self-pay

## 2020-02-05 ENCOUNTER — Other Ambulatory Visit: Payer: Self-pay | Admitting: Family Medicine

## 2020-02-05 MED ORDER — BUPROPION HCL ER (SR) 150 MG PO TB12: 150.0000 mg | ORAL_TABLET | Freq: Two times a day (BID) | ORAL | 2 refills | Status: DC

## 2020-02-05 NOTE — Telephone Encounter (Signed)
Prior Authorization initiated via CoverMyMeds for Cyclobenzaprine Key: [facility]  [place] Approved

## 2020-02-05 NOTE — Telephone Encounter (Signed)
Copied from CRM [id number]. Topic: Quick Communication - Rx Refill/Question >> Feb 05, 2020  9:30 AM Marylen Ponto wrote: Medication: buPROPion (WELLBUTRIN SR) 150 MG 12 hr tablet  Has the patient contacted their pharmacy? no  Preferred Pharmacy (with phone number or street name): [facility] [address] [place]), [place] - [address]  Phone: [phone]   Fax: [phone]  Agent: Please be advised that RX refills may take up to 3 business days. We ask that you follow-up with your pharmacy.

## 2020-02-06 ENCOUNTER — Other Ambulatory Visit: Payer: Self-pay | Admitting: Family Medicine

## 2020-02-09 ENCOUNTER — Telehealth: Payer: Self-pay | Admitting: Family Medicine

## 2020-02-09 NOTE — Telephone Encounter (Signed)
appt

## 2020-02-09 NOTE — Telephone Encounter (Signed)
Copied from CRM [id number]. Topic: Quick Communication - Rx Refill/Question >> Feb 09, 2020  4:21 PM Maebelle Munroe wrote: Medication: traZODone (DESYREL) 100 MG tablet  Patient states medication is not helping, wants to know other options.

## 2020-02-12 NOTE — Telephone Encounter (Signed)
Pt has apt on 02/14/2020

## 2020-02-14 ENCOUNTER — Encounter: Payer: Self-pay | Admitting: Family Medicine

## 2020-02-14 ENCOUNTER — Telehealth: Payer: Self-pay | Admitting: Family Medicine

## 2020-02-14 ENCOUNTER — Ambulatory Visit (INDEPENDENT_AMBULATORY_CARE_PROVIDER_SITE_OTHER): Payer: Medicare HMO | Admitting: Family Medicine

## 2020-02-14 DIAGNOSIS — G8929 Other chronic pain: Secondary | ICD-10-CM

## 2020-02-14 DIAGNOSIS — M25511 Pain in right shoulder: Secondary | ICD-10-CM

## 2020-02-14 MED ORDER — NAPROXEN 500 MG PO TABS: 500.0000 mg | ORAL_TABLET | Freq: Two times a day (BID) | ORAL | 3 refills | Status: DC

## 2020-02-14 NOTE — Telephone Encounter (Signed)
-----   Message from Dorcas Carrow, DO sent at 02/14/2020  9:12 AM EDT ----- 4 weeks

## 2020-02-14 NOTE — Telephone Encounter (Signed)
Patient stated she would call back to make this apt due to starting a new job and is unsure of her schedule at the moment.  

## 2020-02-14 NOTE — Progress Notes (Signed)
Patient stated she would call back to make this apt due to starting a new job and is unsure of her schedule at the moment.

## 2020-02-14 NOTE — Progress Notes (Addendum)
There were no vitals taken for this visit.   Subjective:    Patient ID: Annette Miller, female    DOB: [birth year], 69 y.o.   MRN: [id number]  HPI: Annette Miller is a 69 y.o. female  Chief Complaint  Patient presents with  . Shoulder Pain   SHOULDER PAIN- has been getting worse. Was using voltaren, aspercream, icy hot, flexeril Duration: about a month Involved shoulder: right Mechanism of injury: had a major fall several years ago and broke her shoulder and dislocated her shoulder  Location: lateral Onset:sudden Severity: 3/10 can get up to a 8/10 Quality:  aching Frequency: constant, waxing and waning Radiation: yes- down her arm Aggravating factors: using it   Alleviating factors: ice, APAP and NSAIDs  Status: worse Treatments attempted: rest, ice, heat, sling, APAP, ibuprofen and aleve  Relief with NSAIDs?:  mild Weakness: no Numbness: no Decreased grip strength: no Redness: no Swelling: no Bruising: no Fevers: no  Relevant past medical, surgical, family and social history reviewed and updated as indicated. Interim medical history since our last visit reviewed. Allergies and medications reviewed and updated.  Review of Systems  Constitutional: Negative.   Respiratory: Negative.   Cardiovascular: Negative.   Gastrointestinal: Negative.   Musculoskeletal: Positive for arthralgias and myalgias. Negative for back pain, gait problem, joint swelling, neck pain and neck stiffness.  Skin: Negative.   Neurological: Negative.   Psychiatric/Behavioral: Negative.     Per HPI unless specifically indicated above     Objective:    There were no vitals taken for this visit.  Wt Readings from Last 3 Encounters:  10/27/19 104 lb (47.2 kg)  06/28/19 104 lb (47.2 kg)  05/24/19 106 lb (48.1 kg)    Physical Exam Vitals and nursing note reviewed.  Pulmonary:     Effort: Pulmonary effort is normal. No respiratory distress.     Comments: Speaking in full  sentences Neurological:     Mental Status: She is alert.  Psychiatric:        Mood and Affect: Mood normal.        Behavior: Behavior normal.        Thought Content: Thought content normal.        Judgment: Judgment normal.     Results for orders placed or performed in visit on 11/24/19  Microscopic Examination   URINE  Result Value Ref Range   WBC, UA 0-5 0 - 5 /hpf   RBC 3-10 (A) 0 - 2 /hpf   Epithelial Cells (non renal) 0-10 0 - 10 /hpf   Casts Present None seen /lpf   Cast Type Hyaline casts N/A   Mucus, UA Present Not Estab.   Bacteria, UA None seen None seen/Few  CBC with Differential/Platelet  Result Value Ref Range   WBC 7.9 3.4 - 10.8 x10E3/uL   RBC 3.32 (L) 3.77 - 5.28 x10E6/uL   Hemoglobin 11.2 11.1 - 15.9 g/dL   Hematocrit 53.9 (L) 76.7 - 46.6 %   MCV 101 (H) 79 - 97 fL   MCH 33.7 (H) 26.6 - 33.0 pg   MCHC 33.5 31.5 - 35.7 g/dL   RDW 34.1 93.7 - 90.2 %   Platelets 339 150 - 450 x10E3/uL   Neutrophils 68 Not Estab. %   Lymphs 19 Not Estab. %   Monocytes 6 Not Estab. %   Eos 6 Not Estab. %   Basos 1 Not Estab. %   Neutrophils Absolute 5.4 1.4 - 7.0 x10E3/uL   Lymphocytes  Absolute 1.5 0.7 - 3.1 x10E3/uL   Monocytes Absolute 0.5 0.1 - 0.9 x10E3/uL   EOS (ABSOLUTE) 0.4 0.0 - 0.4 x10E3/uL   Basophils Absolute 0.0 0.0 - 0.2 x10E3/uL   Immature Granulocytes 0 Not Estab. %   Immature Grans (Abs) 0.0 0.0 - 0.1 x10E3/uL  Comprehensive metabolic panel  Result Value Ref Range   Glucose 112 (H) 65 - 99 mg/dL   BUN 24 8 - 27 mg/dL   Creatinine, Ser 6.38 (H) 0.57 - 1.00 mg/dL   GFR calc non Af Amer 50 (L) >59 mL/min/1.73   GFR calc Af Amer 57 (L) >59 mL/min/1.73   BUN/Creatinine Ratio 21 12 - 28   Sodium 141 134 - 144 mmol/L   Potassium 4.8 3.5 - 5.2 mmol/L   Chloride 104 96 - 106 mmol/L   CO2 25 20 - 29 mmol/L   Calcium 9.2 8.7 - 10.3 mg/dL   Total Protein 7.1 6.0 - 8.5 g/dL   Albumin 3.8 3.8 - 4.8 g/dL   Globulin, Total 3.3 1.5 - 4.5 g/dL   Albumin/Globulin  Ratio 1.2 1.2 - 2.2   Bilirubin Total <0.2 0.0 - 1.2 mg/dL   Alkaline Phosphatase 123 (H) 39 - 117 IU/L   AST 12 0 - 40 IU/L   ALT 7 0 - 32 IU/L  Lipid Panel w/o Chol/HDL Ratio  Result Value Ref Range   Cholesterol, Total 159 100 - 199 mg/dL   Triglycerides 756 0 - 149 mg/dL   HDL 52 >43 mg/dL   VLDL Cholesterol Cal 19 5 - 40 mg/dL   LDL Chol Calc (NIH) 88 0 - 99 mg/dL  TSH  Result Value Ref Range   TSH 1.320 0.450 - 4.500 uIU/mL  UA/M w/rflx Culture, Routine   Specimen: Urine   URINE  Result Value Ref Range   Specific Gravity, UA >1.030 (H) 1.005 - 1.030   pH, UA 5.0 5.0 - 7.5   Color, UA Yellow Yellow   Appearance Ur Clear Clear   Leukocytes,UA Negative Negative   Protein,UA Trace (A) Negative/Trace   Glucose, UA Negative Negative   Ketones, UA Negative Negative   RBC, UA 2+ (A) Negative   Bilirubin, UA Negative Negative   Urobilinogen, Ur 0.2 0.2 - 1.0 mg/dL   Nitrite, UA Negative Negative   Microscopic Examination See below:   Vitamin B12  Result Value Ref Range   Vitamin B-12 >2000 (H) 232 - 1245 pg/mL  Specimen status report  Result Value Ref Range   specimen status report Comment       Assessment & Plan:   Problem List Items Addressed This Visit    None    Visit Diagnoses    Chronic right shoulder pain    -  Primary   Concern for significant arthritis. Will obtain x-ray and start short course of naproxen. Recheck 1 month. Call with any concerns.    Relevant Medications   naproxen (NAPROSYN) 500 MG tablet   Other Relevant Orders   DG Shoulder Right       Follow up plan: Return in about 4 weeks (around 03/13/2020).   . This visit was completed via telephone due to the restrictions of the COVID-19 pandemic. All issues as above were discussed and addressed but no physical exam was performed. If it was felt that the patient should be evaluated in the office, they were directed there. The patient verbally consented to this visit. Patient was unable to  complete an audio/visual visit due to Lack  of equipment. Due to the catastrophic nature of the COVID-19 pandemic, this visit was done through audio contact only. . Location of the patient: home . Location of the provider: home . Those involved with this call:  . Provider: Park Liter, DO . CMA: Lauretta Grill, RMA . Front Desk/Registration: Don Perking  . Time spent on call: 15 minutes on the phone discussing health concerns. 23 minutes total spent in review of patient's record and preparation of their chart.

## 2020-02-15 ENCOUNTER — Other Ambulatory Visit: Payer: Self-pay

## 2020-02-15 ENCOUNTER — Ambulatory Visit
Admission: RE | Admit: 2020-02-15 | Discharge: 2020-02-15 | Disposition: A | Discharge status: Home / Self Care | Payer: Medicare HMO | Source: Ambulatory Visit | Attending: Family Medicine | Admitting: Family Medicine

## 2020-02-15 ENCOUNTER — Telehealth: Payer: Self-pay | Admitting: Family Medicine

## 2020-02-15 DIAGNOSIS — M25511 Pain in right shoulder: Secondary | ICD-10-CM | POA: Insufficient documentation

## 2020-02-15 DIAGNOSIS — G8929 Other chronic pain: Secondary | ICD-10-CM

## 2020-02-15 NOTE — Telephone Encounter (Signed)
Routing to provider for advice.

## 2020-02-15 NOTE — Telephone Encounter (Signed)
She is due for a Td.

## 2020-02-15 NOTE — Telephone Encounter (Signed)
  Is pt due   Copied from CRM [id number]. Topic: General - Other >> Feb 15, 2020  1:41 PM Dalphine Handing A wrote: Patient is wanting to come in today after 3pm to get a tetanus shot

## 2020-02-20 ENCOUNTER — Telehealth: Payer: Self-pay | Admitting: Family Medicine

## 2020-02-20 NOTE — Telephone Encounter (Signed)
Called pt scheduled td shot for tomorrow

## 2020-02-21 ENCOUNTER — Other Ambulatory Visit: Payer: Self-pay

## 2020-02-21 ENCOUNTER — Ambulatory Visit (INDEPENDENT_AMBULATORY_CARE_PROVIDER_SITE_OTHER): Payer: Medicare HMO

## 2020-02-21 ENCOUNTER — Ambulatory Visit: Payer: Medicare HMO

## 2020-02-21 DIAGNOSIS — Z23 Encounter for immunization: Secondary | ICD-10-CM

## 2020-02-21 DIAGNOSIS — S90512A Abrasion, left ankle, initial encounter: Secondary | ICD-10-CM | POA: Diagnosis not present

## 2020-03-04 ENCOUNTER — Encounter: Payer: Self-pay | Admitting: Family Medicine

## 2020-03-04 ENCOUNTER — Other Ambulatory Visit: Payer: Self-pay

## 2020-03-04 ENCOUNTER — Ambulatory Visit (INDEPENDENT_AMBULATORY_CARE_PROVIDER_SITE_OTHER): Payer: Medicare HMO | Admitting: Family Medicine

## 2020-03-04 VITALS — Temp 99.6°F | Ht 62.0 in | Wt 101.0 lb

## 2020-03-04 DIAGNOSIS — J069 Acute upper respiratory infection, unspecified: Secondary | ICD-10-CM | POA: Diagnosis not present

## 2020-03-04 MED ORDER — AZITHROMYCIN 250 MG PO TABS
ORAL_TABLET | ORAL | 0 refills | Status: DC
Start: 2020-03-04 — End: 2020-07-16

## 2020-03-04 MED ORDER — PROMETHAZINE HCL 25 MG RE SUPP: 25.0000 mg | Freq: Four times a day (QID) | RECTAL | 0 refills | Status: AC | PRN

## 2020-03-04 NOTE — Progress Notes (Signed)
Temp 99.6 F (37.6 C) (Oral)   Ht 5\' 2"  (1.575 m)   Wt 101 lb (45.8 kg)   BMI 18.47 kg/m    Subjective:    Patient ID: , female    DOB: [birth year], 69 y.o.   MRN: [id number]  HPI: Annette Miller is a 69 y.o. female  Chief Complaint  Patient presents with  . Emesis    Ongoing 2 days  . Sore Throat  . Cough  . Nasal Congestion  . Shortness of Breath   UPPER RESPIRATORY TRACT INFECTION Duration: 5 Worst symptom: cough and vomiting Fever: yes- 99.6 Cough: yes Shortness of breath: no Wheezing: no Chest pain: no Chest tightness: no Chest congestion: no Nasal congestion: yes Runny nose: yes Post nasal drip: yes Sneezing: no Sore throat: yes Swollen glands: no Sinus pressure: yes Headache: no Face pain: no Toothache: no Ear pain: no  Ear pressure: yes left Eyes red/itching:no Eye drainage/crusting: no  Vomiting: yes Rash: no Fatigue: yes Sick contacts: no Strep contacts: no  Context: worse Recurrent sinusitis: no Relief with OTC cold/cough medications: no  Treatments attempted: zofran   Relevant past medical, surgical, family and social history reviewed and updated as indicated. Interim medical history since our last visit reviewed. Allergies and medications reviewed and updated.  Review of Systems  Constitutional: Positive for fatigue and fever. Negative for activity change, appetite change, chills, diaphoresis and unexpected weight change.  HENT: Positive for congestion, ear pain, postnasal drip, rhinorrhea, sinus pressure, sinus pain and sore throat. Negative for dental problem, drooling, ear discharge, facial swelling, hearing loss, mouth sores, nosebleeds, sneezing, tinnitus, trouble swallowing and voice change.   Eyes: Negative.   Respiratory: Positive for cough. Negative for apnea, choking, chest tightness, shortness of breath, wheezing and stridor.   Cardiovascular: Negative.   Gastrointestinal: Negative.   Neurological: Negative.     Psychiatric/Behavioral: Negative.     Per HPI unless specifically indicated above     Objective:    Temp 99.6 F (37.6 C) (Oral)   Ht 5\' 2"  (1.575 m)   Wt 101 lb (45.8 kg)   BMI 18.47 kg/m   Wt Readings from Last 3 Encounters:  03/04/20 101 lb (45.8 kg)  10/27/19 104 lb (47.2 kg)  06/28/19 104 lb (47.2 kg)    Physical Exam Vitals and nursing note reviewed.  Pulmonary:     Effort: Pulmonary effort is normal. No respiratory distress.     Comments: Speaking in full sentences, + hacking, wet cough Neurological:     Mental Status: She is alert.  Psychiatric:        Mood and Affect: Mood normal.        Behavior: Behavior normal.        Thought Content: Thought content normal.        Judgment: Judgment normal.     Results for orders placed or performed in visit on 11/24/19  Microscopic Examination   URINE  Result Value Ref Range   WBC, UA 0-5 0 - 5 /hpf   RBC 3-10 (A) 0 - 2 /hpf   Epithelial Cells (non renal) 0-10 0 - 10 /hpf   Casts Present None seen /lpf   Cast Type Hyaline casts N/A   Mucus, UA Present Not Estab.   Bacteria, UA None seen None seen/Few  CBC with Differential/Platelet  Result Value Ref Range   WBC 7.9 3.4 - 10.8 x10E3/uL   RBC 3.32 (L) 3.77 - 5.28 x10E6/uL   Hemoglobin 11.2  11.1 - 15.9 g/dL   Hematocrit 40.9 (L) 81.1 - 46.6 %   MCV 101 (H) 79 - 97 fL   MCH 33.7 (H) 26.6 - 33.0 pg   MCHC 33.5 31.5 - 35.7 g/dL   RDW 91.4 78.2 - 95.6 %   Platelets 339 150 - 450 x10E3/uL   Neutrophils 68 Not Estab. %   Lymphs 19 Not Estab. %   Monocytes 6 Not Estab. %   Eos 6 Not Estab. %   Basos 1 Not Estab. %   Neutrophils Absolute 5.4 1.4 - 7.0 x10E3/uL   Lymphocytes Absolute 1.5 0.7 - 3.1 x10E3/uL   Monocytes Absolute 0.5 0.1 - 0.9 x10E3/uL   EOS (ABSOLUTE) 0.4 0.0 - 0.4 x10E3/uL   Basophils Absolute 0.0 0.0 - 0.2 x10E3/uL   Immature Granulocytes 0 Not Estab. %   Immature Grans (Abs) 0.0 0.0 - 0.1 x10E3/uL  Comprehensive metabolic panel  Result Value  Ref Range   Glucose 112 (H) 65 - 99 mg/dL   BUN 24 8 - 27 mg/dL   Creatinine, Ser 2.13 (H) 0.57 - 1.00 mg/dL   GFR calc non Af Amer 50 (L) >59 mL/min/1.73   GFR calc Af Amer 57 (L) >59 mL/min/1.73   BUN/Creatinine Ratio 21 12 - 28   Sodium 141 134 - 144 mmol/L   Potassium 4.8 3.5 - 5.2 mmol/L   Chloride 104 96 - 106 mmol/L   CO2 25 20 - 29 mmol/L   Calcium 9.2 8.7 - 10.3 mg/dL   Total Protein 7.1 6.0 - 8.5 g/dL   Albumin 3.8 3.8 - 4.8 g/dL   Globulin, Total 3.3 1.5 - 4.5 g/dL   Albumin/Globulin Ratio 1.2 1.2 - 2.2   Bilirubin Total <0.2 0.0 - 1.2 mg/dL   Alkaline Phosphatase 123 (H) 39 - 117 IU/L   AST 12 0 - 40 IU/L   ALT 7 0 - 32 IU/L  Lipid Panel w/o Chol/HDL Ratio  Result Value Ref Range   Cholesterol, Total 159 100 - 199 mg/dL   Triglycerides 086 0 - 149 mg/dL   HDL 52 >57 mg/dL   VLDL Cholesterol Cal 19 5 - 40 mg/dL   LDL Chol Calc (NIH) 88 0 - 99 mg/dL  TSH  Result Value Ref Range   TSH 1.320 0.450 - 4.500 uIU/mL  UA/M w/rflx Culture, Routine   Specimen: Urine   URINE  Result Value Ref Range   Specific Gravity, UA >1.030 (H) 1.005 - 1.030   pH, UA 5.0 5.0 - 7.5   Color, UA Yellow Yellow   Appearance Ur Clear Clear   Leukocytes,UA Negative Negative   Protein,UA Trace (A) Negative/Trace   Glucose, UA Negative Negative   Ketones, UA Negative Negative   RBC, UA 2+ (A) Negative   Bilirubin, UA Negative Negative   Urobilinogen, Ur 0.2 0.2 - 1.0 mg/dL   Nitrite, UA Negative Negative   Microscopic Examination See below:   Vitamin B12  Result Value Ref Range   Vitamin B-12 >2000 (H) 232 - 1245 pg/mL  Specimen status report  Result Value Ref Range   specimen status report Comment       Assessment & Plan:   Problem List Items Addressed This Visit    None    Visit Diagnoses    Upper respiratory tract infection, unspecified type    -  Primary   s/p COVID vaccine. Will treat with phenergan supp for nausea and azithromycin. Call with any concerns or if not  getting  better.    Relevant Medications   azithromycin (ZITHROMAX) 250 MG tablet       Follow up plan: Return if symptoms worsen or fail to improve.   . This visit was completed via telephone due to the restrictions of the COVID-19 pandemic. All issues as above were discussed and addressed but no physical exam was performed. If it was felt that the patient should be evaluated in the office, they were directed there. The patient verbally consented to this visit. Patient was unable to complete an audio/visual visit due to Lack of equipment. Due to the catastrophic nature of the COVID-19 pandemic, this visit was done through audio contact only. . Location of the patient: home . Location of the provider: work . Those involved with this call:  . Provider: Park Liter, DO . CMA: Merilyn Baba, CMA . Front Desk/Registration: Don Perking  . Time spent on call: 15 minutes on the phone discussing health concerns. 23 minutes total spent in review of patient's record and preparation of their chart.

## 2020-03-06 ENCOUNTER — Telehealth: Payer: Self-pay | Admitting: Family Medicine

## 2020-03-06 NOTE — Telephone Encounter (Signed)
Yes, but if she still has a fever, she should not be going to work.

## 2020-03-06 NOTE — Telephone Encounter (Signed)
fCopied from CRM [id number]. Topic: General - Other >> Mar 06, 2020 10:58 AM Randol Kern wrote: Reason for CRM: Pt called to request a Doctor's note for her job that explains why she was out for 03/04/2020 and 03/05/2020. Wants document printed so she can pick it up.  Best contact: [phone] Pt also states she has a little bit of a fever and still coughing.

## 2020-03-06 NOTE — Telephone Encounter (Signed)
Patient called in stating she would like her work note ready for pick up tomorrow, as she returns back to work on Friday. Please advise.

## 2020-03-06 NOTE — Telephone Encounter (Signed)
Routing to provider. OK to generate note requested by patient with the dates listed?

## 2020-03-07 NOTE — Telephone Encounter (Signed)
Letter printed.

## 2020-03-08 ENCOUNTER — Other Ambulatory Visit: Payer: Self-pay | Admitting: Family Medicine

## 2020-03-08 DIAGNOSIS — M25572 Pain in left ankle and joints of left foot: Secondary | ICD-10-CM

## 2020-03-08 NOTE — Telephone Encounter (Signed)
Requested  medications are  due for refill today yes  Requested medications are on the active medication list yes  Last refill 02/10/20  Notes to clinic Not Delegated

## 2020-03-15 ENCOUNTER — Other Ambulatory Visit: Payer: Self-pay | Admitting: Family Medicine

## 2020-03-15 ENCOUNTER — Other Ambulatory Visit: Payer: Self-pay | Admitting: Gastroenterology

## 2020-04-19 ENCOUNTER — Encounter: Payer: Medicare HMO | Admitting: Family Medicine

## 2020-04-22 ENCOUNTER — Telehealth: Payer: Self-pay | Admitting: Family Medicine

## 2020-04-22 NOTE — Telephone Encounter (Signed)
Copied from CRM [id number]. Topic: Medicare AWV >> Apr 22, 2020 10:38 AM Claudette Laws R wrote: Reason for CRM:  Left message for patient to call back and schedule the Medicare Annual Wellness Visit (AWV) virtually.  Last AWV 04/03/2019  Please schedule at anytime with [facility]-Nurse Health Advisor.  45 minute appointment

## 2020-05-25 ENCOUNTER — Other Ambulatory Visit: Payer: Self-pay | Admitting: Family Medicine

## 2020-05-25 ENCOUNTER — Other Ambulatory Visit: Payer: Self-pay | Admitting: Gastroenterology

## 2020-05-25 DIAGNOSIS — M25572 Pain in left ankle and joints of left foot: Secondary | ICD-10-CM

## 2020-05-25 NOTE — Telephone Encounter (Signed)
Requested  medications are  due for refill today yes  Requested medications are on the active medication list yes  Last refill 7/31  Last visit: 01/2020 addressing this med  Future visit scheduled 05/2020  Notes to clinic Not Delegated

## 2020-05-28 ENCOUNTER — Telehealth: Payer: Self-pay

## 2020-05-28 NOTE — Telephone Encounter (Signed)
PA for Cyclobenazeprine initiated and submitted via Cover My Meds. Key: [id number]

## 2020-05-28 NOTE — Telephone Encounter (Signed)
PA approved.

## 2020-05-29 ENCOUNTER — Other Ambulatory Visit: Payer: Self-pay

## 2020-06-06 ENCOUNTER — Encounter: Payer: Medicare HMO | Admitting: Family Medicine

## 2020-06-26 ENCOUNTER — Other Ambulatory Visit: Payer: Self-pay | Admitting: Family Medicine

## 2020-07-11 ENCOUNTER — Telehealth: Payer: Self-pay

## 2020-07-11 NOTE — Telephone Encounter (Signed)
Copied from CRM [id number]. Topic: General - Inquiry >> Jul 10, 2020  4:55 PM Adrian Prince D wrote: Reason for CRM: Patient called and asked for the nurse, she found 2 lumps under her right arm and wanted to discuss with nurse. I offered her an appointment but the doctor has nothing available until 07/29/20. She wants to be seen before then, she can be reached at [phone]. Please advise

## 2020-07-11 NOTE — Telephone Encounter (Signed)
Scheduled pt for 07/16/2020 as first available slot.

## 2020-07-11 NOTE — Telephone Encounter (Signed)
Please Advise.  KP

## 2020-07-11 NOTE — Telephone Encounter (Signed)
OK for first same day

## 2020-07-11 NOTE — Telephone Encounter (Signed)
No slots availably, Did pcp want to add her to same day slot?

## 2020-07-16 ENCOUNTER — Encounter: Payer: Self-pay | Admitting: Family Medicine

## 2020-07-16 ENCOUNTER — Telehealth: Payer: Self-pay

## 2020-07-16 ENCOUNTER — Telehealth: Payer: Self-pay | Admitting: Family Medicine

## 2020-07-16 ENCOUNTER — Other Ambulatory Visit: Payer: Self-pay

## 2020-07-16 ENCOUNTER — Ambulatory Visit (INDEPENDENT_AMBULATORY_CARE_PROVIDER_SITE_OTHER): Payer: Medicare HMO | Admitting: Family Medicine

## 2020-07-16 ENCOUNTER — Ambulatory Visit
Admission: RE | Admit: 2020-07-16 | Discharge: 2020-07-16 | Disposition: A | Discharge status: Home / Self Care | Payer: Medicare HMO | Source: Ambulatory Visit | Attending: Family Medicine | Admitting: Family Medicine

## 2020-07-16 VITALS — BP 115/81 | HR 81 | Temp 97.4°F | Ht 62.0 in | Wt 92.4 lb

## 2020-07-16 DIAGNOSIS — L02411 Cutaneous abscess of right axilla: Secondary | ICD-10-CM

## 2020-07-16 DIAGNOSIS — Z23 Encounter for immunization: Secondary | ICD-10-CM

## 2020-07-16 DIAGNOSIS — R2231 Localized swelling, mass and lump, right upper limb: Secondary | ICD-10-CM | POA: Diagnosis not present

## 2020-07-16 MED ORDER — SULFAMETHOXAZOLE-TRIMETHOPRIM 800-160 MG PO TABS: 1.0000 | ORAL_TABLET | Freq: Two times a day (BID) | ORAL | 0 refills | Status: DC

## 2020-07-16 MED ORDER — TRAMADOL HCL 50 MG PO TABS: 50.0000 mg | ORAL_TABLET | Freq: Three times a day (TID) | ORAL | 0 refills | Status: AC | PRN

## 2020-07-16 NOTE — Telephone Encounter (Signed)
No report available

## 2020-07-16 NOTE — Progress Notes (Signed)
BP 115/81   Pulse 81   Temp (!) 97.4 F (36.3 C) (Oral)   Ht 5\' 2"  (1.575 m)   Wt 92 lb 6.4 oz (41.9 kg)   BMI 16.90 kg/m    Subjective:    Patient ID: , female    DOB: 1950/10/16, 69 y.o.   MRN: [id number]  HPI: Annette Miller is a 69 y.o. female  Chief Complaint  Patient presents with  . Breast Mass    Breast Exam - Pt found 2 lumps under her Rt arm last Wednesday.    LUMP Duration: 6 days Location: L axilla Onset: sudden Painful: yes Discomfort: yes Status:  1 popped yesterday and oozing pus Trauma: no Redness: yes Bruising: no Recent infection: no Swollen lymph nodes: no Requesting removal: no History of cancer: no Family history of cancer: no History of the same: no   Relevant past medical, surgical, family and social history reviewed and updated as indicated. Interim medical history since our last visit reviewed. Allergies and medications reviewed and updated.  Review of Systems  Constitutional: Negative.   Respiratory: Negative.   Cardiovascular: Negative.   Gastrointestinal: Negative.   Genitourinary: Negative.   Musculoskeletal: Negative.   Skin: Positive for wound. Negative for color change, pallor and rash.  Psychiatric/Behavioral: Negative.     Per HPI unless specifically indicated above     Objective:    BP 115/81   Pulse 81   Temp (!) 97.4 F (36.3 C) (Oral)   Ht 5\' 2"  (1.575 m)   Wt 92 lb 6.4 oz (41.9 kg)   BMI 16.90 kg/m   Wt Readings from Last 3 Encounters:  07/16/20 92 lb 6.4 oz (41.9 kg)  03/04/20 101 lb (45.8 kg)  10/27/19 104 lb (47.2 kg)    Physical Exam Vitals and nursing note reviewed.  Constitutional:      General: She is not in acute distress.    Appearance: Normal appearance. She is not ill-appearing, toxic-appearing or diaphoretic.  HENT:     Head: Normocephalic and atraumatic.     Right Ear: External ear normal.     Left Ear: External ear normal.     Nose: Nose normal.     Mouth/Throat:      Mouth: Mucous membranes are moist.     Pharynx: Oropharynx is clear.  Eyes:     General: No scleral icterus.       Right eye: No discharge.        Left eye: No discharge.     Extraocular Movements: Extraocular movements intact.     Conjunctiva/sclera: Conjunctivae normal.     Pupils: Pupils are equal, round, and reactive to light.  Cardiovascular:     Rate and Rhythm: Normal rate and regular rhythm.     Pulses: Normal pulses.     Heart sounds: Normal heart sounds. No murmur heard.  No friction rub. No gallop.   Pulmonary:     Effort: Pulmonary effort is normal. No respiratory distress.     Breath sounds: Normal breath sounds. No stridor. No wheezing, rhonchi or rales.  Chest:     Chest wall: No tenderness.  Musculoskeletal:        General: Normal range of motion.     Cervical back: Normal range of motion and neck supple.  Skin:    General: Skin is warm and dry.     Capillary Refill: Capillary refill takes less than 2 seconds.     Coloration: Skin is not  jaundiced or pale.     Findings: No bruising, erythema, lesion or rash.     Comments: 2 inch fluctuant swelling in R axilla oozing pus, 3 inch mass in deep R axilla  Neurological:     General: No focal deficit present.     Mental Status: She is alert and oriented to person, place, and time. Mental status is at baseline.  Psychiatric:        Mood and Affect: Mood normal.        Behavior: Behavior normal.        Thought Content: Thought content normal.        Judgment: Judgment normal.     Results for orders placed or performed in visit on 11/24/19  Microscopic Examination   URINE  Result Value Ref Range   WBC, UA 0-5 0 - 5 /hpf   RBC 3-10 (A) 0 - 2 /hpf   Epithelial Cells (non renal) 0-10 0 - 10 /hpf   Casts Present None seen /lpf   Cast Type Hyaline casts N/A   Mucus, UA Present Not Estab.   Bacteria, UA None seen None seen/Few  CBC with Differential/Platelet  Result Value Ref Range   WBC 7.9 3.4 - 10.8 x10E3/uL    RBC 3.32 (L) 3.77 - 5.28 x10E6/uL   Hemoglobin 11.2 11.1 - 15.9 g/dL   Hematocrit 16.1 (L) 09.6 - 46.6 %   MCV 101 (H) 79 - 97 fL   MCH 33.7 (H) 26.6 - 33.0 pg   MCHC 33.5 31 - 35 g/dL   RDW 04.5 40.9 - 81.1 %   Platelets 339 150 - 450 x10E3/uL   Neutrophils 68 Not Estab. %   Lymphs 19 Not Estab. %   Monocytes 6 Not Estab. %   Eos 6 Not Estab. %   Basos 1 Not Estab. %   Neutrophils Absolute 5.4 1.40 - 7.00 x10E3/uL   Lymphocytes Absolute 1.5 0 - 3 x10E3/uL   Monocytes Absolute 0.5 0 - 0 x10E3/uL   EOS (ABSOLUTE) 0.4 0.0 - 0.4 x10E3/uL   Basophils Absolute 0.0 0 - 0 x10E3/uL   Immature Granulocytes 0 Not Estab. %   Immature Grans (Abs) 0.0 0.0 - 0.1 x10E3/uL  Comprehensive metabolic panel  Result Value Ref Range   Glucose 112 (H) 65 - 99 mg/dL   BUN 24 8 - 27 mg/dL   Creatinine, Ser 9.14 (H) 0.57 - 1.00 mg/dL   GFR calc non Af Amer 50 (L) >59 mL/min/1.73   GFR calc Af Amer 57 (L) >59 mL/min/1.73   BUN/Creatinine Ratio 21 12 - 28   Sodium 141 134 - 144 mmol/L   Potassium 4.8 3.5 - 5.2 mmol/L   Chloride 104 96 - 106 mmol/L   CO2 25 20 - 29 mmol/L   Calcium 9.2 8.7 - 10.3 mg/dL   Total Protein 7.1 6.0 - 8.5 g/dL   Albumin 3.8 3.8 - 4.8 g/dL   Globulin, Total 3.3 1.5 - 4.5 g/dL   Albumin/Globulin Ratio 1.2 1.2 - 2.2   Bilirubin Total <0.2 0.0 - 1.2 mg/dL   Alkaline Phosphatase 123 (H) 39 - 117 IU/L   AST 12 0 - 40 IU/L   ALT 7 0 - 32 IU/L  Lipid Panel w/o Chol/HDL Ratio  Result Value Ref Range   Cholesterol, Total 159 100 - 199 mg/dL   Triglycerides 782 0 - 149 mg/dL   HDL 52 >95 mg/dL   VLDL Cholesterol Cal 19 5 - 40 mg/dL  LDL Chol Calc (NIH) 88 0 - 99 mg/dL  TSH  Result Value Ref Range   TSH 1.320 0.450 - 4.500 uIU/mL  UA/M w/rflx Culture, Routine   Specimen: Urine   URINE  Result Value Ref Range   Specific Gravity, UA >1.030 (H) 1.005 - 1.030   pH, UA 5.0 5.0 - 7.5   Color, UA Yellow Yellow   Appearance Ur Clear Clear   Leukocytes,UA Negative Negative    Protein,UA Trace (A) Negative/Trace   Glucose, UA Negative Negative   Ketones, UA Negative Negative   RBC, UA 2+ (A) Negative   Bilirubin, UA Negative Negative   Urobilinogen, Ur 0.2 0.2 - 1.0 mg/dL   Nitrite, UA Negative Negative   Microscopic Examination See below:   Vitamin B12  Result Value Ref Range   Vitamin B-12 >2000 (H) 232 - 1245 pg/mL  Specimen status report  Result Value Ref Range   specimen status report Comment       Assessment & Plan:   Problem List Items Addressed This Visit    None    Visit Diagnoses    Abscess of right axilla    -  Primary   Superficial abscess drained as below- deep mass very painful, unable to be addressed in office. Will get into gen surg w/ [place] prior. Tx with bactrim.    Relevant Orders   Ambulatory referral to General Surgery   [place] AXILLA RIGHT   Need for immunization against influenza       Flu shot given today.   Relevant Orders   Flu Vaccine QUAD High Dose(Fluad) (Completed)      Skin Procedure  Procedure: Informed consent given.  Sterile prep of the area.  Area infiltrated with lidocaine with epinephrine.  Using a surgical blade, part of the upper dermis shaved off and sent  for pathology.  Area cauterized. Pt ed on scarring     Diagnosis:   ICD-10-CM   1. Abscess of right axilla  L02.411 Ambulatory referral to General Surgery    [place] AXILLA RIGHT    CANCELED: [place] AXILLA LEFT   Superficial abscess drained as below- deep mass very painful, unable to be addressed in office. Will get into gen surg w/ [place] prior. Tx with bactrim.   2. Need for immunization against influenza  Z23 Flu Vaccine QUAD High Dose(Fluad)   Flu shot given today.    Lesion Location/Size: 2 inch fluctuant swelling in R axilla oozing pus Physician: MJ Consent:  Risks, benefits, and alternative treatments discussed and all questions were answered.  Patient elected to proceed and verbal consent obtained.  Description: Area prepped and draped using semi-sterile  technique. Area locally anesthetized using 7 cc's of lidocaine 2% with epi. Using a 15 blade scalpel, a 2.5cm incision was made above the lesion. Abscess cavity entered and copious amount of purulent material expressed. Deep abscess not accessible through small incision. Wound packed with packing and covered with non-stick pad and tape.  Post Procedure Instructions: Wound care instructions discussed and patient was instructed to keep area clean and dry.  Signs and symptoms of infection discussed, patient agrees to contact the office ASAP should they occur.  Dressing change recommended per general surgery  Follow up plan: Return Pending surgery consult.

## 2020-07-16 NOTE — Telephone Encounter (Signed)
Called and left VM informing patient of medication being sent to her pharmacy.  CM

## 2020-07-16 NOTE — Telephone Encounter (Signed)
Please let her know that I sent tramadol to her pharmacy.

## 2020-07-16 NOTE — Telephone Encounter (Signed)
Patient called to ask the doctor if she could send in a stronger pain medication.  She stated that she tried the Tylenol that the doctor prescribed and it is not helping her at all.  Please advise and call patient to confirm at [phone]

## 2020-07-16 NOTE — Telephone Encounter (Signed)
Copied from CRM [id number]. Topic: General - Other >> Jul 16, 2020  3:52 PM Dalphine Handing A wrote: [facility] ultrasound called and wanted to make sure Dr .Laural Benes viewed ultrasound report in Epic. Please advise

## 2020-07-17 ENCOUNTER — Other Ambulatory Visit: Payer: Self-pay

## 2020-07-17 ENCOUNTER — Telehealth: Payer: Self-pay | Admitting: Surgery

## 2020-07-17 ENCOUNTER — Ambulatory Visit: Payer: Medicare HMO | Admitting: Surgery

## 2020-07-17 ENCOUNTER — Encounter: Payer: Self-pay | Admitting: Surgery

## 2020-07-17 ENCOUNTER — Other Ambulatory Visit
Admission: RE | Admit: 2020-07-17 | Discharge: 2020-07-17 | Disposition: A | Discharge status: Home / Self Care | Payer: Medicare HMO | Source: Ambulatory Visit | Attending: Surgery | Admitting: Surgery

## 2020-07-17 VITALS — BP 149/87 | HR 92 | Temp 98.6°F | Resp 12 | Ht 62.0 in | Wt 92.4 lb

## 2020-07-17 DIAGNOSIS — L02411 Cutaneous abscess of right axilla: Secondary | ICD-10-CM | POA: Diagnosis not present

## 2020-07-17 DIAGNOSIS — Z01812 Encounter for preprocedural laboratory examination: Secondary | ICD-10-CM | POA: Diagnosis not present

## 2020-07-17 DIAGNOSIS — Z20822 Contact with and (suspected) exposure to covid-19: Secondary | ICD-10-CM | POA: Insufficient documentation

## 2020-07-17 LAB — SARS CORONAVIRUS 2 (TAT 6-24 HRS): SARS Coronavirus 2: NEGATIVE

## 2020-07-17 MED ORDER — ACETAMINOPHEN 500 MG PO TABS: 1000.0000 mg | ORAL_TABLET | ORAL | Status: AC

## 2020-07-17 MED ORDER — LACTATED RINGERS IV SOLN: INTRAVENOUS | Status: DC

## 2020-07-17 MED ORDER — GABAPENTIN 300 MG PO CAPS: 300.0000 mg | ORAL_CAPSULE | ORAL | Status: AC

## 2020-07-17 MED ORDER — ORAL CARE MOUTH RINSE: 15.0000 mL | Freq: Once | OROMUCOSAL | Status: AC

## 2020-07-17 MED ORDER — CEFAZOLIN SODIUM-DEXTROSE 2-4 GM/100ML-% IV SOLN
2.0000 g | INTRAVENOUS | Status: AC
  Administered 2020-07-18: 2 g via INTRAVENOUS

## 2020-07-17 MED ORDER — CHLORHEXIDINE GLUCONATE 0.12 % MT SOLN: 15.0000 mL | Freq: Once | OROMUCOSAL | Status: AC

## 2020-07-17 NOTE — Telephone Encounter (Signed)
I spoke with patient in person.  She is advised of Pre-Admission date/time, COVID Testing date and Surgery date.  Surgery Date: 07/18/20 Preadmission Testing Date: 07/18/20 (in office, patient to arrive at 6:00 am) Covid Testing Date: 07/17/20 - patient [facility] to [facility] ([address] [facility]) between 8a-1p   Patient already informed to arrive at 6:00 am the morning of her surgery on 07/18/20 and voices understanding.

## 2020-07-17 NOTE — Progress Notes (Signed)
Outpatient Surgical Follow Up  07/17/2020  Annette Miller is an 69 y.o. female.   Chief Complaint  Patient presents with  . Follow-up    R abscess axillary    HPI: 69 year old female well-known to me with a prior history of perforated diverticulitis requiring Hartman's subsequently colostomy takedown.  Reports that started having right axillary pain for the last week or so no fevers no chills no she noticed some drainage from the right axilla.  She went to her primary care doctor yesterday and had a partial I&D.  She was started on Bactrim.  Patient CMP shows a normal labs except mild acute kidney injury. Creatinine is 1.1.  BC is otherwise normal.  He also underwent an ultrasound showing equivocal findings with packing.  Past Medical History:  Diagnosis Date  . Allergy   . Anemia    improved since surgery in May 2019  . Anxiety   . Arthritis   . Depression   . GERD (gastroesophageal reflux disease) 2019   PUD, history of diverticulitis  . Goiter   . H/O cardiac arrest 03/2018   s/p surgery and while in hospital. no cardiac history  . Headache   . History of blood transfusion 01/2018  . Hyperlipidemia   . Hypothyroidism   . Osteoporosis     Past Surgical History:  Procedure Laterality Date  . ABDOMINAL HYSTERECTOMY  1979  . APPENDECTOMY  01/2018  . COLONOSCOPY WITH PROPOFOL N/A 01/14/2018   Procedure: COLONOSCOPY WITH PROPOFOL;  Surgeon: Toney Reil, MD;  Location: [facility] ENDOSCOPY;  Service: Gastroenterology;  Laterality: N/A;  . COLONOSCOPY WITH PROPOFOL N/A 06/24/2018   Procedure: COLONOSCOPY WITH PROPOFOL;  Surgeon: [name], MD;  Location: [facility] ENDOSCOPY;  Service: Gastroenterology;  Laterality: N/A;  . COLONOSCOPY WITH PROPOFOL N/A 05/24/2019   Procedure: COLONOSCOPY WITH PROPOFOL;  Surgeon: [name], MD;  Location: [facility] ENDOSCOPY;  Service: Gastroenterology;  Laterality: N/A;  . COLOSTOMY TAKEDOWN N/A 08/30/2018   Procedure: COLOSTOMY TAKEDOWN;  Surgeon:  Leafy Ro, MD;  Location: [facility] ORS;  Service: General;  Laterality: N/A;  . DIAGNOSTIC LAPAROSCOPY    . ESOPHAGOGASTRODUODENOSCOPY N/A 04/06/2018   Procedure: ESOPHAGOGASTRODUODENOSCOPY (EGD);  Surgeon: Toney Reil, MD;  Location: [facility] ENDOSCOPY;  Service: Gastroenterology;  Laterality: N/A;  . ESOPHAGOGASTRODUODENOSCOPY (EGD) WITH PROPOFOL N/A 01/14/2018   Procedure: ESOPHAGOGASTRODUODENOSCOPY (EGD) WITH PROPOFOL;  Surgeon: Toney Reil, MD;  Location: [facility] ENDOSCOPY;  Service: Gastroenterology;  Laterality: N/A;  . ESOPHAGOGASTRODUODENOSCOPY (EGD) WITH PROPOFOL N/A 06/24/2018   Procedure: ESOPHAGOGASTRODUODENOSCOPY (EGD) WITH PROPOFOL;  Surgeon: [name], MD;  Location: [facility] ENDOSCOPY;  Service: Gastroenterology;  Laterality: N/A;  . FLEXIBLE SIGMOIDOSCOPY N/A 06/28/2019   Procedure: FLEXIBLE SIGMOIDOSCOPY with Dilation;  Surgeon: [name], MD;  Location: [facility] ENDOSCOPY;  Service: Gastroenterology;  Laterality: N/A;  . FLEXIBLE SIGMOIDOSCOPY N/A 07/20/2019   Procedure: FLEXIBLE SIGMOIDOSCOPY;  Surgeon: [name], MD;  Location: [facility] ENDOSCOPY;  Service: Gastroenterology;  Laterality: N/A;  . LAPAROTOMY N/A 02/25/2018   Procedure: EXPLORATORY LAPAROTOMY, PARTMAN'S PROCEDURE, APPENDECTOMY, COLOSTOMY;  Surgeon: Star Age, DO;  Location: [facility] ORS;  Service: General;  Laterality: N/A;  . TONSILLECTOMY  1976  . VENTRAL HERNIA REPAIR N/A 08/30/2018   Procedure: HERNIA REPAIR VENTRAL ADULT;  Surgeon: Leafy Ro, MD;  Location: [facility] ORS;  Service: General;  Laterality: N/A;  . VISCERAL ANGIOGRAPHY N/A 04/07/2018   Procedure: VISCERAL ANGIOGRAPHY;  Surgeon: Annice Needy, MD;  Location: [facility] INVASIVE CV LAB;  Service: Cardiovascular;  Laterality: N/A;  Family History  Problem Relation Age of Onset  . Cancer Mother   . AAA (abdominal aortic aneurysm) Father   . Cancer Maternal Grandmother   . Stroke Maternal Grandmother   . Cancer Paternal Grandfather     Social  History:  reports that she has been smoking cigarettes. She has been smoking about 0.10 packs per day. She has never used smokeless tobacco. She reports that she does not drink alcohol and does not use drugs.  Allergies:  Allergies  Allergen Reactions  . Aspirin     HISTORY OF PUD, DIVERTICULITIS, BOWEL PERFORATION  . Prednisone Other (See Comments)    Reaction: violence Can not take any kind of steroids  . Codeine Diarrhea and Nausea And Vomiting  . Tape Rash    Transpore tape: redness, irritation at site  Tegaderm somewhat okay. All tapes cause itching    Medications reviewed.    ROS Full ROS performed and is otherwise negative other than what is stated in HPI   BP (!) 149/87   Pulse 92   Temp 98.6 F (37 C)   Resp 12   Ht 5\' 2"  (1.575 m)   Wt 92 lb 6.4 oz (41.9 kg)   BMI 16.90 kg/m   Physical Exam Vitals and nursing note reviewed. Exam conducted with a chaperone present.  Constitutional:      General: She is not in acute distress.    Appearance: Normal appearance. She is normal weight.  Neck:     Vascular: No carotid bruit.  Cardiovascular:     Rate and Rhythm: Normal rate and regular rhythm.     Heart sounds: Normal heart sounds. No murmur heard.   Pulmonary:     Effort: Pulmonary effort is normal. No respiratory distress.     Breath sounds: Normal breath sounds. No stridor.  Abdominal:     General: Abdomen is flat. There is no distension.     Palpations: Abdomen is soft. There is no mass.     Tenderness: There is no abdominal tenderness. There is no guarding or rebound.     Hernia: No hernia is present.  Musculoskeletal:     Cervical back: Normal range of motion and neck supple. No rigidity or tenderness.  Lymphadenopathy:     Cervical: No cervical adenopathy.  Skin:    General: Skin is warm and dry.     Capillary Refill: Capillary refill takes less than 2 seconds.     Comments: Right axilla with tenderness and fluctuance as well as induration.  There  is significant blanching erythema consistent with cellulitis and abscess.  No evidence of necrotizing infection  Neurological:     General: No focal deficit present.     Mental Status: She is alert and oriented to person, place, and time.  Psychiatric:        Mood and Affect: Mood normal.        Behavior: Behavior normal.        Thought Content: Thought content normal.        Judgment: Judgment normal.        No results found for this or any previous visit (from the past 48 hour(s)). AXILLA RIGHT  Result Date: 07/16/2020 CLINICAL DATA:  Right axillary abscess with same-day incision and drainage. Gauze packing is in place. EXAM: ULTRASOUND OF RIGHT AXILLARY SOFT TISSUES TECHNIQUE: Ultrasound examination of the axillary soft tissues was performed in the area of clinical concern. COMPARISON:  NONE. FINDINGS: Targeted ultrasound was performed of the  soft tissues of the right axilla at site of patient's recent incision and drainage. Within the subcutaneous soft tissues is a complex collection measuring 3.9 x 1.2 x 2.7 cm. Collection is mixed echogenicity. No internal vascularity. There is a small sinus tract extending to the overlying skin surface. The surrounding subcutaneous fat is heterogeneously echogenic with increased vascularity. IMPRESSION: Complex mixed echogenicity collection within the right axillary soft tissues measuring up to 3.9 cm with sinus track extending to the overlying skin surface. Sonographer reports there is packing material in place following recent incision and drainage. Collection likely represents a combination of packing material with a trace amount of residual fluid. Clinical follow-up to resolution is recommended. Electronically Signed   By: Duanne Guess D.O.   On: 07/16/2020 15:35    Assessment/Plan: 69 year old female with now right axillary abscess.  Discussed with the patient in detail all for her to perform this at the bedside here under local but this  area is very sensitive and the patient wishes to go ahead and have this addressed in the OR with better pain control.  Procedure discussed with the patient in detail.  Risks, benefits and possible occasions including but not limited to: Bleeding, infection injury to adjacent organs.  She understands and wished to proceed [facility] understands that she will need mammogram after the inflammatory response infection resolves Greater than 50% of the 40 minutes  visit was spent in counseling/coordination of care   Sterling Big, MD [facility] General Surgeon

## 2020-07-17 NOTE — Patient Instructions (Signed)
Our surgery scheduler will call you today to confirm your surgery date. You will need to have your Covid test today.

## 2020-07-17 NOTE — H&P (View-Only) (Signed)
Outpatient Surgical Follow Up  07/17/2020  Annette Miller is an 69 y.o. female.   Chief Complaint  Patient presents with  . Follow-up    R abscess axillary    HPI: 69 year old female well-known to me with a prior history of perforated diverticulitis requiring Hartman's subsequently colostomy takedown.  Reports that started having right axillary pain for the last week or so no fevers no chills no she noticed some drainage from the right axilla.  She went to her primary care doctor yesterday and had a partial I&D.  She was started on Bactrim.  Patient CMP shows a normal labs except mild acute kidney injury. Creatinine is 1.1.  BC is otherwise normal.  He also underwent an ultrasound showing equivocal findings with packing.  Past Medical History:  Diagnosis Date  . Allergy   . Anemia    improved since surgery in May 2019  . Anxiety   . Arthritis   . Depression   . GERD (gastroesophageal reflux disease) 2019   PUD, history of diverticulitis  . Goiter   . H/O cardiac arrest 03/2018   s/p surgery and while in hospital. no cardiac history  . Headache   . History of blood transfusion 01/2018  . Hyperlipidemia   . Hypothyroidism   . Osteoporosis     Past Surgical History:  Procedure Laterality Date  . ABDOMINAL HYSTERECTOMY  1979  . APPENDECTOMY  01/2018  . COLONOSCOPY WITH PROPOFOL N/A 01/14/2018   Procedure: COLONOSCOPY WITH PROPOFOL;  Surgeon: Toney Reil, MD;  Location: [facility] ENDOSCOPY;  Service: Gastroenterology;  Laterality: N/A;  . COLONOSCOPY WITH PROPOFOL N/A 06/24/2018   Procedure: COLONOSCOPY WITH PROPOFOL;  Surgeon: [name], MD;  Location: [facility] ENDOSCOPY;  Service: Gastroenterology;  Laterality: N/A;  . COLONOSCOPY WITH PROPOFOL N/A 05/24/2019   Procedure: COLONOSCOPY WITH PROPOFOL;  Surgeon: [name], MD;  Location: [facility] ENDOSCOPY;  Service: Gastroenterology;  Laterality: N/A;  . COLOSTOMY TAKEDOWN N/A 08/30/2018   Procedure: COLOSTOMY TAKEDOWN;  Surgeon:  Leafy Ro, MD;  Location: [facility] ORS;  Service: General;  Laterality: N/A;  . DIAGNOSTIC LAPAROSCOPY    . ESOPHAGOGASTRODUODENOSCOPY N/A 04/06/2018   Procedure: ESOPHAGOGASTRODUODENOSCOPY (EGD);  Surgeon: Toney Reil, MD;  Location: [facility] ENDOSCOPY;  Service: Gastroenterology;  Laterality: N/A;  . ESOPHAGOGASTRODUODENOSCOPY (EGD) WITH PROPOFOL N/A 01/14/2018   Procedure: ESOPHAGOGASTRODUODENOSCOPY (EGD) WITH PROPOFOL;  Surgeon: Toney Reil, MD;  Location: [facility] ENDOSCOPY;  Service: Gastroenterology;  Laterality: N/A;  . ESOPHAGOGASTRODUODENOSCOPY (EGD) WITH PROPOFOL N/A 06/24/2018   Procedure: ESOPHAGOGASTRODUODENOSCOPY (EGD) WITH PROPOFOL;  Surgeon: [name], MD;  Location: [facility] ENDOSCOPY;  Service: Gastroenterology;  Laterality: N/A;  . FLEXIBLE SIGMOIDOSCOPY N/A 06/28/2019   Procedure: FLEXIBLE SIGMOIDOSCOPY with Dilation;  Surgeon: [name], MD;  Location: [facility] ENDOSCOPY;  Service: Gastroenterology;  Laterality: N/A;  . FLEXIBLE SIGMOIDOSCOPY N/A 07/20/2019   Procedure: FLEXIBLE SIGMOIDOSCOPY;  Surgeon: [name], MD;  Location: [facility] ENDOSCOPY;  Service: Gastroenterology;  Laterality: N/A;  . LAPAROTOMY N/A 02/25/2018   Procedure: EXPLORATORY LAPAROTOMY, PARTMAN'S PROCEDURE, APPENDECTOMY, COLOSTOMY;  Surgeon: Star Age, DO;  Location: [facility] ORS;  Service: General;  Laterality: N/A;  . TONSILLECTOMY  1976  . VENTRAL HERNIA REPAIR N/A 08/30/2018   Procedure: HERNIA REPAIR VENTRAL ADULT;  Surgeon: Leafy Ro, MD;  Location: [facility] ORS;  Service: General;  Laterality: N/A;  . VISCERAL ANGIOGRAPHY N/A 04/07/2018   Procedure: VISCERAL ANGIOGRAPHY;  Surgeon: Annice Needy, MD;  Location: [facility] INVASIVE CV LAB;  Service: Cardiovascular;  Laterality: N/A;  Family History  Problem Relation Age of Onset  . Cancer Mother   . AAA (abdominal aortic aneurysm) Father   . Cancer Maternal Grandmother   . Stroke Maternal Grandmother   . Cancer Paternal Grandfather     Social  History:  reports that she has been smoking cigarettes. She has been smoking about 0.10 packs per day. She has never used smokeless tobacco. She reports that she does not drink alcohol and does not use drugs.  Allergies:  Allergies  Allergen Reactions  . Aspirin     HISTORY OF PUD, DIVERTICULITIS, BOWEL PERFORATION  . Prednisone Other (See Comments)    Reaction: violence Can not take any kind of steroids  . Codeine Diarrhea and Nausea And Vomiting  . Tape Rash    Transpore tape: redness, irritation at site  Tegaderm somewhat okay. All tapes cause itching    Medications reviewed.    ROS Full ROS performed and is otherwise negative other than what is stated in HPI   BP (!) 149/87   Pulse 92   Temp 98.6 F (37 C)   Resp 12   Ht 5\' 2"  (1.575 m)   Wt 92 lb 6.4 oz (41.9 kg)   BMI 16.90 kg/m   Physical Exam Vitals and nursing note reviewed. Exam conducted with a chaperone present.  Constitutional:      General: She is not in acute distress.    Appearance: Normal appearance. She is normal weight.  Neck:     Vascular: No carotid bruit.  Cardiovascular:     Rate and Rhythm: Normal rate and regular rhythm.     Heart sounds: Normal heart sounds. No murmur heard.   Pulmonary:     Effort: Pulmonary effort is normal. No respiratory distress.     Breath sounds: Normal breath sounds. No stridor.  Abdominal:     General: Abdomen is flat. There is no distension.     Palpations: Abdomen is soft. There is no mass.     Tenderness: There is no abdominal tenderness. There is no guarding or rebound.     Hernia: No hernia is present.  Musculoskeletal:     Cervical back: Normal range of motion and neck supple. No rigidity or tenderness.  Lymphadenopathy:     Cervical: No cervical adenopathy.  Skin:    General: Skin is warm and dry.     Capillary Refill: Capillary refill takes less than 2 seconds.     Comments: Right axilla with tenderness and fluctuance as well as induration.  There  is significant blanching erythema consistent with cellulitis and abscess.  No evidence of necrotizing infection  Neurological:     General: No focal deficit present.     Mental Status: She is alert and oriented to person, place, and time.  Psychiatric:        Mood and Affect: Mood normal.        Behavior: Behavior normal.        Thought Content: Thought content normal.        Judgment: Judgment normal.        No results found for this or any previous visit (from the past 48 hour(s)). AXILLA RIGHT  Result Date: 07/16/2020 CLINICAL DATA:  Right axillary abscess with same-day incision and drainage. Gauze packing is in place. EXAM: ULTRASOUND OF RIGHT AXILLARY SOFT TISSUES TECHNIQUE: Ultrasound examination of the axillary soft tissues was performed in the area of clinical concern. COMPARISON:  NONE. FINDINGS: Targeted ultrasound was performed of the  soft tissues of the right axilla at site of patient's recent incision and drainage. Within the subcutaneous soft tissues is a complex collection measuring 3.9 x 1.2 x 2.7 cm. Collection is mixed echogenicity. No internal vascularity. There is a small sinus tract extending to the overlying skin surface. The surrounding subcutaneous fat is heterogeneously echogenic with increased vascularity. IMPRESSION: Complex mixed echogenicity collection within the right axillary soft tissues measuring up to 3.9 cm with sinus track extending to the overlying skin surface. Sonographer reports there is packing material in place following recent incision and drainage. Collection likely represents a combination of packing material with a trace amount of residual fluid. Clinical follow-up to resolution is recommended. Electronically Signed   By: Duanne Guess D.O.   On: 07/16/2020 15:35    Assessment/Plan: 69 year old female with now right axillary abscess.  Discussed with the patient in detail all for her to perform this at the bedside here under local but this  area is very sensitive and the patient wishes to go ahead and have this addressed in the OR with better pain control.  Procedure discussed with the patient in detail.  Risks, benefits and possible occasions including but not limited to: Bleeding, infection injury to adjacent organs.  She understands and wished to proceed [facility] understands that she will need mammogram after the inflammatory response infection resolves Greater than 50% of the 40 minutes  visit was spent in counseling/coordination of care   Sterling Big, MD [facility] General Surgeon

## 2020-07-18 ENCOUNTER — Ambulatory Visit: Payer: Medicare HMO | Admitting: Urgent Care

## 2020-07-18 ENCOUNTER — Ambulatory Visit
Admission: RE | Admit: 2020-07-18 | Discharge: 2020-07-18 | Disposition: A | Discharge status: Home / Self Care | Payer: Medicare HMO | Attending: Surgery | Admitting: Surgery

## 2020-07-18 ENCOUNTER — Other Ambulatory Visit: Payer: Self-pay

## 2020-07-18 ENCOUNTER — Encounter
Admission: RE | Disposition: A | Discharge status: Home / Self Care | Payer: Self-pay | Source: Home / Self Care | Attending: Surgery

## 2020-07-18 ENCOUNTER — Encounter: Payer: Self-pay | Admitting: Surgery

## 2020-07-18 DIAGNOSIS — L03111 Cellulitis of right axilla: Secondary | ICD-10-CM | POA: Insufficient documentation

## 2020-07-18 DIAGNOSIS — D649 Anemia, unspecified: Secondary | ICD-10-CM | POA: Diagnosis not present

## 2020-07-18 DIAGNOSIS — L0501 Pilonidal cyst with abscess: Secondary | ICD-10-CM | POA: Diagnosis not present

## 2020-07-18 DIAGNOSIS — Z01810 Encounter for preprocedural cardiovascular examination: Secondary | ICD-10-CM | POA: Diagnosis not present

## 2020-07-18 DIAGNOSIS — R590 Localized enlarged lymph nodes: Secondary | ICD-10-CM

## 2020-07-18 DIAGNOSIS — L02421 Furuncle of right axilla: Secondary | ICD-10-CM | POA: Diagnosis not present

## 2020-07-18 DIAGNOSIS — B9562 Methicillin resistant Staphylococcus aureus infection as the cause of diseases classified elsewhere: Secondary | ICD-10-CM | POA: Diagnosis not present

## 2020-07-18 DIAGNOSIS — L02411 Cutaneous abscess of right axilla: Secondary | ICD-10-CM | POA: Diagnosis not present

## 2020-07-18 DIAGNOSIS — Z8674 Personal history of sudden cardiac arrest: Secondary | ICD-10-CM | POA: Diagnosis not present

## 2020-07-18 DIAGNOSIS — M793 Panniculitis, unspecified: Secondary | ICD-10-CM | POA: Insufficient documentation

## 2020-07-18 DIAGNOSIS — R69 Illness, unspecified: Secondary | ICD-10-CM | POA: Diagnosis not present

## 2020-07-18 DIAGNOSIS — F17210 Nicotine dependence, cigarettes, uncomplicated: Secondary | ICD-10-CM | POA: Insufficient documentation

## 2020-07-18 DIAGNOSIS — E039 Hypothyroidism, unspecified: Secondary | ICD-10-CM | POA: Diagnosis not present

## 2020-07-18 DIAGNOSIS — K219 Gastro-esophageal reflux disease without esophagitis: Secondary | ICD-10-CM | POA: Insufficient documentation

## 2020-07-18 DIAGNOSIS — E785 Hyperlipidemia, unspecified: Secondary | ICD-10-CM | POA: Diagnosis not present

## 2020-07-18 HISTORY — PX: INCISION AND DRAINAGE ABSCESS: SHX5864

## 2020-07-18 LAB — CBC
HCT: 34.6 % — ABNORMAL LOW (ref 36.0–46.0)
Hemoglobin: 11.2 g/dL — ABNORMAL LOW (ref 12.0–15.0)
MCH: 32.9 pg (ref 26.0–34.0)
MCHC: 32.4 g/dL (ref 30.0–36.0)
MCV: 101.8 fL — ABNORMAL HIGH (ref 80.0–100.0)
Platelets: 342 10*3/uL (ref 150–400)
RBC: 3.4 MIL/uL — ABNORMAL LOW (ref 3.87–5.11)
RDW: 12 % (ref 11.5–15.5)
WBC: 4.6 10*3/uL (ref 4.0–10.5)
nRBC: 0 % (ref 0.0–0.2)

## 2020-07-18 LAB — BASIC METABOLIC PANEL
Anion gap: 10 (ref 5–15)
BUN: 11 mg/dL (ref 8–23)
CO2: 24 mmol/L (ref 22–32)
Calcium: 8.8 mg/dL — ABNORMAL LOW (ref 8.9–10.3)
Chloride: 101 mmol/L (ref 98–111)
Creatinine, Ser: 0.79 mg/dL (ref 0.44–1.00)
GFR, Estimated: >60 mL/min (ref >60)
Glucose, Bld: 81 mg/dL (ref 70–99)
Potassium: 3.5 mmol/L (ref 3.5–5.1)
Sodium: 135 mmol/L (ref 135–145)

## 2020-07-18 SURGERY — INCISION AND DRAINAGE, ABSCESS
Anesthesia: General | Site: Axilla | Laterality: Right

## 2020-07-18 MED ORDER — PROPOFOL 10 MG/ML IV BOLUS
INTRAVENOUS | Status: AC
  Filled 2020-07-18: qty 20

## 2020-07-18 MED ORDER — ACETAMINOPHEN 500 MG PO TABS
ORAL_TABLET | ORAL | Status: AC
  Administered 2020-07-18: 1000 mg via ORAL
  Filled 2020-07-18: qty 2

## 2020-07-18 MED ORDER — PROPOFOL 10 MG/ML IV BOLUS
INTRAVENOUS | Status: DC | PRN
  Administered 2020-07-18: 100 mg via INTRAVENOUS

## 2020-07-18 MED ORDER — ONDANSETRON HCL 4 MG/2ML IJ SOLN: 4.0000 mg | Freq: Once | INTRAMUSCULAR | Status: DC | PRN

## 2020-07-18 MED ORDER — CHLORHEXIDINE GLUCONATE 0.12 % MT SOLN
OROMUCOSAL | Status: AC
  Administered 2020-07-18: 15 mL via OROMUCOSAL
  Filled 2020-07-18: qty 15

## 2020-07-18 MED ORDER — KETOROLAC TROMETHAMINE 30 MG/ML IJ SOLN
INTRAMUSCULAR | Status: AC
  Filled 2020-07-18: qty 1

## 2020-07-18 MED ORDER — FENTANYL CITRATE (PF) 100 MCG/2ML IJ SOLN
INTRAMUSCULAR | Status: AC
  Administered 2020-07-18: 25 ug via INTRAVENOUS
  Filled 2020-07-18: qty 2

## 2020-07-18 MED ORDER — EPHEDRINE SULFATE 50 MG/ML IJ SOLN
INTRAMUSCULAR | Status: DC | PRN
  Administered 2020-07-18 (×2): 10 mg via INTRAVENOUS

## 2020-07-18 MED ORDER — KETOROLAC TROMETHAMINE 30 MG/ML IJ SOLN
INTRAMUSCULAR | Status: DC | PRN
  Administered 2020-07-18: 30 mg via INTRAVENOUS

## 2020-07-18 MED ORDER — CHLORHEXIDINE GLUCONATE CLOTH 2 % EX PADS: 6.0000 | MEDICATED_PAD | Freq: Once | CUTANEOUS | Status: DC

## 2020-07-18 MED ORDER — CEFAZOLIN SODIUM-DEXTROSE 2-4 GM/100ML-% IV SOLN
INTRAVENOUS | Status: AC
  Filled 2020-07-18: qty 100

## 2020-07-18 MED ORDER — MIDAZOLAM HCL 2 MG/2ML IJ SOLN
INTRAMUSCULAR | Status: AC
  Filled 2020-07-18: qty 2

## 2020-07-18 MED ORDER — FENTANYL CITRATE (PF) 100 MCG/2ML IJ SOLN
INTRAMUSCULAR | Status: DC | PRN
  Administered 2020-07-18 (×3): 25 ug via INTRAVENOUS

## 2020-07-18 MED ORDER — ONDANSETRON HCL 4 MG/2ML IJ SOLN
INTRAMUSCULAR | Status: AC
  Filled 2020-07-18: qty 2

## 2020-07-18 MED ORDER — FENTANYL CITRATE (PF) 100 MCG/2ML IJ SOLN
25.0000 ug | INTRAMUSCULAR | Status: AC | PRN
  Administered 2020-07-18 (×4): 25 ug via INTRAVENOUS

## 2020-07-18 MED ORDER — ONDANSETRON HCL 4 MG/2ML IJ SOLN
INTRAMUSCULAR | Status: DC | PRN
  Administered 2020-07-18: 4 mg via INTRAVENOUS

## 2020-07-18 MED ORDER — OXYCODONE HCL 5 MG PO TABS
ORAL_TABLET | ORAL | Status: AC
  Filled 2020-07-18: qty 1

## 2020-07-18 MED ORDER — HYDROCODONE-ACETAMINOPHEN 5-325 MG PO TABS: 1.0000 | ORAL_TABLET | Freq: Four times a day (QID) | ORAL | 0 refills | Status: DC | PRN

## 2020-07-18 MED ORDER — DIPHENHYDRAMINE HCL 50 MG/ML IJ SOLN
INTRAMUSCULAR | Status: AC
  Filled 2020-07-18: qty 1

## 2020-07-18 MED ORDER — OXYCODONE HCL 5 MG PO TABS
5.0000 mg | ORAL_TABLET | Freq: Once | ORAL | Status: AC
  Administered 2020-07-18: 5 mg via ORAL

## 2020-07-18 MED ORDER — LIDOCAINE HCL (PF) 2 % IJ SOLN
INTRAMUSCULAR | Status: AC
  Filled 2020-07-18: qty 5

## 2020-07-18 MED ORDER — MIDAZOLAM HCL 2 MG/2ML IJ SOLN
INTRAMUSCULAR | Status: DC | PRN
  Administered 2020-07-18: 2 mg via INTRAVENOUS

## 2020-07-18 MED ORDER — DIPHENHYDRAMINE HCL 50 MG/ML IJ SOLN
6.2500 mg | INTRAMUSCULAR | Status: DC | PRN
  Administered 2020-07-18: 6.5 mg via INTRAVENOUS

## 2020-07-18 MED ORDER — PHENYLEPHRINE HCL (PRESSORS) 10 MG/ML IV SOLN
INTRAVENOUS | Status: DC | PRN
  Administered 2020-07-18: 100 ug via INTRAVENOUS

## 2020-07-18 MED ORDER — LIDOCAINE HCL (CARDIAC) PF 100 MG/5ML IV SOSY
PREFILLED_SYRINGE | INTRAVENOUS | Status: DC | PRN
  Administered 2020-07-18: 40 mg via INTRAVENOUS

## 2020-07-18 MED ORDER — GABAPENTIN 300 MG PO CAPS
ORAL_CAPSULE | ORAL | Status: AC
  Administered 2020-07-18: 300 mg via ORAL
  Filled 2020-07-18: qty 1

## 2020-07-18 MED ORDER — FENTANYL CITRATE (PF) 100 MCG/2ML IJ SOLN
INTRAMUSCULAR | Status: AC
  Filled 2020-07-18: qty 2

## 2020-07-18 MED ORDER — EPHEDRINE 5 MG/ML INJ
INTRAVENOUS | Status: AC
  Filled 2020-07-18: qty 10

## 2020-07-18 SURGICAL SUPPLY — 22 items
BLADE CLIPPER SURG (BLADE) ×1 IMPLANT
BLADE SURG 15 STRL LF DISP TIS (BLADE) ×1 IMPLANT
BLADE SURG 15 STRL SS (BLADE) ×2
BRUSH SCRUB EZ  4% CHG (MISCELLANEOUS) ×2
BRUSH SCRUB EZ 4% CHG (MISCELLANEOUS) ×1 IMPLANT
CANISTER SUCT 3000ML PPV (MISCELLANEOUS) ×2 IMPLANT
COVER WAND RF STERILE (DRAPES) ×2 IMPLANT
DRAPE LAPAROTOMY 77X122 PED (DRAPES) ×2 IMPLANT
ELECT REM PT RETURN 9FT ADLT (ELECTROSURGICAL) ×2
ELECTRODE REM PT RTRN 9FT ADLT (ELECTROSURGICAL) ×1 IMPLANT
GAUZE PACKING IODOFORM 1/2 (PACKING) ×1 IMPLANT
GAUZE SPONGE 4X4 12PLY STRL (GAUZE/BANDAGES/DRESSINGS) ×1 IMPLANT
GLOVE BIO SURGEON STRL SZ7 (GLOVE) ×2 IMPLANT
GOWN STRL REUS W/ TWL LRG LVL3 (GOWN DISPOSABLE) ×2 IMPLANT
GOWN STRL REUS W/TWL LRG LVL3 (GOWN DISPOSABLE) ×4
NEEDLE HYPO 22GX1.5 SAFETY (NEEDLE) ×2 IMPLANT
NS IRRIG 1000ML POUR BTL (IV SOLUTION) ×2 IMPLANT
PACK BASIN MINOR (MISCELLANEOUS) ×2 IMPLANT
SOL PREP PVP 2OZ (MISCELLANEOUS) ×2
SOLUTION PREP PVP 2OZ (MISCELLANEOUS) ×1 IMPLANT
SPONGE LAP 18X18 RF (DISPOSABLE) ×2 IMPLANT
SWAB CULTURE AMIES ANAERIB BLU (MISCELLANEOUS) ×1 IMPLANT

## 2020-07-18 NOTE — Transfer of Care (Signed)
Immediate Anesthesia Transfer of Care Note  Patient: Annette Miller  Procedure(s) Performed: INCISION AND DRAINAGE ABSCESS, axillary abscess (Right Axilla)  Patient Location: PACU  Anesthesia Type:General  Level of Consciousness: awake, drowsy and patient cooperative  Airway & Oxygen Therapy: Patient Spontanous Breathing and Patient connected to face mask oxygen  Post-op Assessment: Report given to RN and Post -op Vital signs reviewed and stable  Post vital signs: Reviewed and stable  Last Vitals:  Vitals Value Taken Time  BP 134/66 07/18/20 1526  Temp 36.4 C 07/18/20 1526  Pulse 74 07/18/20 1531  Resp 11 07/18/20 1531  SpO2 99 % 07/18/20 1531    Last Pain:  Vitals:   07/18/20 1526  TempSrc:   PainSc: Asleep         Complications: No complications documented.

## 2020-07-18 NOTE — Interval H&P Note (Signed)
History and Physical Interval Note:  07/18/2020 2:42 PM  Annette Miller  has presented today for surgery, with the diagnosis of Axillary abscess.  The various methods of treatment have been discussed with the patient and family. After consideration of risks, benefits and other options for treatment, the patient has consented to  Procedure(s): INCISION AND DRAINAGE ABSCESS, axillary abscess (Right) as a surgical intervention.  The patient's history has been reviewed, patient examined, no change in status, stable for surgery.  I have reviewed the patient's chart and labs.  Questions were answered to the patient's satisfaction.     Emile Ringgenberg F Noriel Guthrie

## 2020-07-18 NOTE — Anesthesia Postprocedure Evaluation (Signed)
Anesthesia Post Note  Patient: [name]  Procedure(s) Performed: INCISION AND DRAINAGE ABSCESS, axillary abscess (Right Axilla)  Patient location during evaluation: PACU Anesthesia Type: General Level of consciousness: awake and alert Pain management: pain level controlled Vital Signs Assessment: post-procedure vital signs reviewed and stable Respiratory status: spontaneous breathing, nonlabored ventilation, respiratory function stable and patient connected to nasal cannula oxygen Cardiovascular status: blood pressure returned to baseline and stable Postop Assessment: no apparent nausea or vomiting Anesthetic complications: no   No complications documented.   Last Vitals:  Vitals:   07/18/20 1636 07/18/20 1649  BP: 137/67 111/87  Pulse: 66 75  Resp:  18  Temp:  (!) 36.4 C  SpO2: 96%     Last Pain:  Vitals:   07/18/20 1649  TempSrc: Temporal  PainSc: 3                  Cleda Mccreedy Jaggar Benko

## 2020-07-18 NOTE — Op Note (Addendum)
  07/18/2020  3:15 PM  PATIENT:  Annette Miller  69 y.o. female  PRE-OPERATIVE DIAGNOSIS:  Right Axillary abscess  POST-OPERATIVE DIAGNOSIS:  Same  PROCEDURE:   1. Incision and drainage of complex Right Axillary abscess 2. excisional debridement of skin subcutaneous tissue and fascia measuring  9 square centimeters 3. Right deep axillary biopsy    SURGEON:  Surgeon(s) and Role:    * Mandolin Falwell F, MD - Primary  FINDINGS: Deep right axillary abscess with reactive lymph node  ANESTHESIA: General  EBL: 5 cc    DICTATION:  Patient was explained about the  Procedure in detail. Risks, benefits and  possible complications and a consent was obtained. The patient was taken to the operating room and placed in the supine position. The small area that was draining pus and appropriate cultures were obtained.  Using a 15 blade knife we proceeded to drain the abscess cavity using elliptical incision.  Electrocautery was used to dissect through subtenons tissue.  About 5 cc of pus was drained.  We proceeded to perform an excisional biopsy of devitalized tissue.  This was performed using a curette.  Please note that this included subcutaneous tissue of skin and fascia.  We also noted an enlarged lymph node within the axilla.  Given some concerns for potential cancer I perform lymph node biopsy in the standard fashion.  Electrocautery was used to obtain hemostasis.  We visualized the intercostal brachial nerve and preserve it.  Irrigation with normal saline and the wound was packed with half-inch packing.  Needle and laparotomy counts were correct and there were no immediate complications  Leafy Ro, MD

## 2020-07-18 NOTE — Discharge Instructions (Addendum)
AMBULATORY SURGERY  DISCHARGE INSTRUCTIONS   1) The drugs that you were given will stay in your system until tomorrow so for the next 24 hours you should not:  A) Drive an automobile B) Make any legal decisions C) Drink any alcoholic beverage   2) You may resume regular meals tomorrow.  Today it is better to start with liquids and gradually work up to solid foods.  You may eat anything you prefer, but it is better to start with liquids, then soup and crackers, and gradually work up to solid foods.   3) Please notify your doctor immediately if you have any unusual bleeding, trouble breathing, redness and pain at the surgery site, drainage, fever, or pain not relieved by medication.    4) Additional Instructions:        Please contact your physician with any problems or Same Day Surgery at [phone], Monday through Friday 6 am to 4 pm, or [place] at [facility] number at [phone].  Skin Abscess  A skin abscess is an infected area of your skin that contains pus and other material. An abscess can happen in any part of your body. Some abscesses break open (rupture) on their own. Most continue to get worse unless they are treated. The infection can spread deeper into the body and into your blood, which can make you feel sick. A skin abscess is caused by germs that enter the skin through a cut or scrape. It can also be caused by blocked oil and sweat glands or infected hair follicles. This condition is usually treated by:  Draining the pus.  Taking antibiotic medicines.  Placing a warm, wet washcloth over the abscess. Follow these instructions at home: Medicines   Take over-the-counter and prescription medicines only as told by your doctor.  If you were prescribed an antibiotic medicine, take it as told by your doctor. Do not stop taking the antibiotic even if you start to feel better. Abscess care   If you have an abscess that has not drained, place a warm,  clean, wet washcloth over the abscess several times a day. Do this as told by your doctor.  Follow instructions from your doctor about how to take care of your abscess. Make sure you: ? Cover the abscess with a bandage (dressing). ? Change your bandage or gauze as told by your doctor. ? Wash your hands with soap and water before you change the bandage or gauze. If you cannot use soap and water, use hand sanitizer.  Check your abscess every day for signs that the infection is getting worse. Check for: ? More redness, swelling, or pain. ? More fluid or blood. ? Warmth. ? More pus or a bad smell. General instructions  To avoid spreading the infection: ? Do not share personal care items, towels, or hot tubs with others. ? Avoid making skin-to-skin contact with other people.  Keep all follow-up visits as told by your doctor. This is important. Contact a doctor if:  You have more redness, swelling, or pain around your abscess.  You have more fluid or blood coming from your abscess.  Your abscess feels warm when you touch it.  You have more pus or a bad smell coming from your abscess.  You have a fever.  Your muscles ache.  You have chills.  You feel sick. Get help right away if:  You have very bad (severe) pain.  You see red streaks on your skin spreading away from the abscess. Summary  A skin abscess is an infected area of your skin that contains pus and other material.  The abscess is caused by germs that enter the skin through a cut or scrape. It can also be caused by blocked oil and sweat glands or infected hair follicles.  Follow your doctor's instructions on caring for your abscess, taking medicines, preventing infections, and keeping follow-up visits. This information is not intended to replace advice given to you by your health care provider. Make sure you discuss any questions you have with your health care provider. Document Revised: 04/20/2019 Document Reviewed:  10/28/2017 Elsevier Patient Education  2020 [employer].

## 2020-07-18 NOTE — Anesthesia Preprocedure Evaluation (Signed)
Anesthesia Evaluation  Patient identified by MRN, date of birth, ID band Patient awake    Reviewed: Allergy & Precautions, NPO status , Patient's Chart, lab work & pertinent test results, reviewed documented beta blocker date and time   History of Anesthesia Complications Negative for: history of anesthetic complications  Airway Mallampati: II  TM Distance: >3 FB     Dental  (+) Chipped   Pulmonary neg sleep apnea, neg COPD, Current Smoker and Patient abstained from smoking.,    breath sounds clear to auscultation       Cardiovascular Exercise Tolerance: Good METS(-) hypertension+ Past MI  (-) CAD (-) dysrhythmias  Rhythm:Regular Rate:Normal - Systolic murmurs Hx of cardiac arrest post operatively, presumed to be from blood loss. TTE 2019: - Left ventricle: The cavity size was normal. Systolic function was  normal. The estimated ejection fraction was in the range of 50%  to 55%. Wall motion was normal; there were no regional wall  motion abnormalities. Features are consistent with a pseudonormal  left ventricular filling pattern, with concomitant abnormal  relaxation and increased filling pressure (grade 2 diastolic  dysfunction).  - Mitral valve: There was mild regurgitation.  - Left atrium: The atrium was normal in size.  - Right ventricle: Systolic function was normal.  - Pulmonary arteries: Systolic pressure was moderatrely elevated PA  peak pressure: 50 mm Hg (S).    Neuro/Psych  Headaches, PSYCHIATRIC DISORDERS Anxiety Depression    GI/Hepatic GERD  Medicated and Controlled,(+)     (-) substance abuse  ,   Endo/Other  neg diabetesHypothyroidism   Renal/GU Renal disease     Musculoskeletal  (+) Arthritis ,   Abdominal   Peds  Hematology  (+) anemia ,   Anesthesia Other Findings Past Medical History: No date: Allergy No date: Anemia     Comment:  improved since surgery in May 2019 No  date: Anxiety No date: Arthritis No date: Depression 2019: GERD (gastroesophageal reflux disease)     Comment:  PUD, history of diverticulitis No date: Goiter 03/2018: H/O cardiac arrest     Comment:  s/p surgery and while in hospital. no cardiac history No date: Headache 01/2018: History of blood transfusion No date: Hyperlipidemia No date: Hypothyroidism No date: Osteoporosis  Reproductive/Obstetrics                             Anesthesia Physical  Anesthesia Plan  ASA: III  Anesthesia Plan: General   Post-op Pain Management:    Induction: Intravenous  PONV Risk Score and Plan: 2 and Ondansetron and Dexamethasone  Airway Management Planned: LMA  Additional Equipment: None  Intra-op Plan:   Post-operative Plan: Extubation in OR  Informed Consent: I have reviewed the patients History and Physical, chart, labs and discussed the procedure including the risks, benefits and alternatives for the proposed anesthesia with the patient or authorized representative who has indicated his/her understanding and acceptance.     Dental advisory given  Plan Discussed with: CRNA  Anesthesia Plan Comments: (Discussed risks of anesthesia with patient, including PONV, sore throat, lip/dental damage. Rare risks discussed as well, such as cardiorespiratory and neurological sequelae. Patient understands.)        Anesthesia Quick Evaluation

## 2020-07-18 NOTE — Anesthesia Procedure Notes (Signed)
Procedure Name: LMA Insertion Date/Time: 07/18/2020 2:42 PM Performed by: Reece Agar, CRNA Pre-anesthesia Checklist: Patient identified, Emergency Drugs available, Suction available and Patient being monitored Patient Re-evaluated:Patient Re-evaluated prior to induction Oxygen Delivery Method: Circle system utilized Preoxygenation: Pre-oxygenation with 100% oxygen Induction Type: IV induction Ventilation: Mask ventilation without difficulty LMA: LMA inserted LMA Size: 4.0 Tube type: Oral Number of attempts: 1 Placement Confirmation: ETT inserted through vocal cords under direct vision,  positive ETCO2 and breath sounds checked- equal and bilateral Tube secured with: Tape Dental Injury: Teeth and Oropharynx as per pre-operative assessment

## 2020-07-19 ENCOUNTER — Encounter: Payer: Self-pay | Admitting: Surgery

## 2020-07-19 NOTE — Progress Notes (Signed)
Patient changed outer layer of dressing due to pink liquid which is most likely to be blood mixed with irrigation. She stated it was good this morning.

## 2020-07-22 LAB — SURGICAL PATHOLOGY

## 2020-07-24 ENCOUNTER — Encounter: Payer: Self-pay | Admitting: Surgery

## 2020-07-24 ENCOUNTER — Other Ambulatory Visit: Payer: Self-pay

## 2020-07-24 ENCOUNTER — Ambulatory Visit (INDEPENDENT_AMBULATORY_CARE_PROVIDER_SITE_OTHER): Payer: Medicare HMO | Admitting: Surgery

## 2020-07-24 VITALS — BP 153/92 | HR 98 | Temp 97.8°F | Resp 12 | Ht 62.0 in | Wt 93.6 lb

## 2020-07-24 DIAGNOSIS — Z09 Encounter for follow-up examination after completed treatment for conditions other than malignant neoplasm: Secondary | ICD-10-CM

## 2020-07-24 LAB — AEROBIC/ANAEROBIC CULTURE W GRAM STAIN (SURGICAL/DEEP WOUND): Gram Stain: NONE SEEN

## 2020-07-24 NOTE — Patient Instructions (Signed)
Continue to remove old packing-shower- and replace with new packing. Apply a dry dressing over the area.

## 2020-07-26 ENCOUNTER — Other Ambulatory Visit: Payer: Self-pay | Admitting: Family Medicine

## 2020-07-26 NOTE — Telephone Encounter (Signed)
Requested medications are due for refill today yes  Requested medications are on the active medication list yes  Last refill 10/19  Last visit 1 week ago  Notes to clinic Not Delegated.

## 2020-07-26 NOTE — Progress Notes (Signed)
Annette Miller is following after debridement and I&D of little the right axillary abscess She is doing well.  No fevers no chills perForming daily packing  PE NAD Wound measuring 3 x 2 cm on the right axilla with good granulation tissue.  Packing removed and replaced.  No evidence of any other collections or necrotizing infection  A/P doing well.  Continue daily wound care.  No need for surgical intervention.  Return to clinic in a month

## 2020-07-28 ENCOUNTER — Other Ambulatory Visit: Payer: Self-pay | Admitting: Gastroenterology

## 2020-07-29 ENCOUNTER — Encounter: Payer: Medicare HMO | Admitting: Surgery

## 2020-07-30 ENCOUNTER — Telehealth: Payer: Self-pay

## 2020-07-30 ENCOUNTER — Ambulatory Visit (INDEPENDENT_AMBULATORY_CARE_PROVIDER_SITE_OTHER): Payer: Medicare HMO | Admitting: Surgery

## 2020-07-30 ENCOUNTER — Encounter: Payer: Self-pay | Admitting: Surgery

## 2020-07-30 ENCOUNTER — Other Ambulatory Visit: Payer: Self-pay

## 2020-07-30 VITALS — BP 107/66 | HR 108 | Temp 98.6°F | Resp 12 | Ht 62.0 in | Wt 91.2 lb

## 2020-07-30 DIAGNOSIS — R197 Diarrhea, unspecified: Secondary | ICD-10-CM | POA: Diagnosis not present

## 2020-07-30 DIAGNOSIS — E785 Hyperlipidemia, unspecified: Secondary | ICD-10-CM | POA: Diagnosis not present

## 2020-07-30 DIAGNOSIS — Z6820 Body mass index (BMI) 20.0-20.9, adult: Secondary | ICD-10-CM | POA: Diagnosis not present

## 2020-07-30 NOTE — Telephone Encounter (Signed)
She reports stomach pain and cramping with loose stools that have a bad odor. She reports that she has a temp of 99.4. she reports vomiting twice last night.  She is still changing her incision daily. No worsening of the area, still some yellow drainage.  She finished her antibiotics about 3 days ago. She will come in today this afternoon to be seen.

## 2020-07-30 NOTE — Patient Instructions (Addendum)
Continue with washing the wound with soap and water. Replace the new packing material and apply dry dressing and secure with tape.  You may use nasal saline spray for your sinus. Stay hydrated.    Fiber Content in Foods  See the following list for the dietary fiber content of some common foods. High-fiber foods High-fiber foods contain 4 grams or more (4g or more) of fiber per serving. They include:  Artichoke (fresh) -- 1 medium has 10.3g of fiber.  Baked beans, plain or vegetarian (canned) --  cup has 5.2g of fiber.  Blackberries or raspberries (fresh) --  cup has 4g of fiber.  Bran cereal --  cup has 8.6g of fiber.  Bulgur (cooked) --  cup has 4g of fiber.  Kidney beans (canned) --  cup has 6.8g of fiber.  Lentils (cooked) --  cup has 7.8g of fiber.  Pear (fresh) -- 1 medium has 5.1g of fiber.  Peas (frozen) --  cup has 4.4g of fiber.  Pinto beans (canned) --  cup has 5.5g of fiber.  Pinto beans (dried and cooked) --  cup has 7.7g of fiber.  Potato with skin (baked) -- 1 medium has 4.4g of fiber.  Quinoa (cooked) --  cup has 5g of fiber.  Soybeans (canned, frozen, or fresh) --  cup has 5.1g of fiber. Moderate-fiber foods Moderate-fiber foods contain 1-4 grams (1-4g) of fiber per serving. They include:  Almonds -- 1 oz. has 3.5g of fiber.  Apple with skin -- 1 medium has 3.3g of fiber.  Applesauce, sweetened --  cup has 1.5g of fiber.  Bagel, plain -- one 4-inch (10-cm) bagel has 2g of fiber.  Banana -- 1 medium has 3.1g of fiber.  Broccoli (cooked) --  cup has 2.5g of fiber.  Carrots (cooked) --  cup has 2.3g of fiber.  Corn (canned or frozen) --  cup has 2.1g of fiber.  Corn tortilla -- one 6-inch (15-cm) tortilla has 1.5g of fiber.  Green beans (canned) --  cup has 2g of fiber.  Instant oatmeal --  cup has about 2g of fiber.  Long-grain brown rice (cooked) -- 1 cup has 3.5g of fiber.  Macaroni, enriched (cooked) -- 1 cup has 2.5g  of fiber.  Melon -- 1 cup has 1.4g of fiber.  Multigrain cereal --  cup has about 2-4g of fiber.  Orange -- 1 small has 3.1g of fiber.  Potatoes, mashed --  cup has 1.6g of fiber.  Raisins -- 1/4 cup has 1.6g of fiber.  Squash --  cup has 2.9g of fiber.  Sunflower seeds --  cup has 1.1g of fiber.  Tomato -- 1 medium has 1.5g of fiber.  Vegetable or soy patty -- 1 has 3.4g of fiber.  Whole-wheat bread -- 1 slice has 2g of fiber.  Whole-wheat spaghetti --  cup has 3.2g of fiber. Low-fiber foods Low-fiber foods contain less than 1 gram (less than 1g) of fiber per serving. They include:  Egg -- 1 large.  Flour tortilla -- one 6-inch (15-cm) tortilla.  Fruit juice --  cup.  Lettuce -- 1 cup.  Meat, poultry, or fish -- 1 oz.  Milk -- 1 cup.  Spinach (raw) -- 1 cup.  White bread -- 1 slice.  White rice --  cup.  Yogurt --  cup. Actual amounts of fiber in foods may be different depending on processing. Talk with your dietitian about how much fiber you need in your diet. This information is not intended to  replace advice given to you by your health care provider. Make sure you discuss any questions you have with your health care provider. Document Revised: 05/07/2016 Document Reviewed: 11/07/2015 Elsevier Patient Education  2020 [employer].

## 2020-07-31 ENCOUNTER — Other Ambulatory Visit: Payer: Self-pay | Admitting: Family Medicine

## 2020-07-31 DIAGNOSIS — M25572 Pain in left ankle and joints of left foot: Secondary | ICD-10-CM

## 2020-07-31 NOTE — Progress Notes (Signed)
[facility] POST-OP OFFICE VISIT  07/31/2020  HPI: Annette Miller is a 69 y.o. female 2 weeks s/p drainage of right axillary abscess.  That is not her present issue.  She had some hypotension while on the job, felt weak ill and dizzy, had some episodes of loose stools.  She finished her course of antibiotics about 3 days ago.  She denies nausea or vomiting.  But has had some anorexia.  She appears to be quite alert, without any distress today.  Rather talkative and communicative about her work and seemingly just needed some follow-up, perhaps concerns about her right axillary area.  Vital signs: BP 107/66    Pulse (!) 108    Temp 98.6 F (37 C) (Oral)    Resp 12    Ht 5\' 2"  (1.575 m)    Wt 91 lb 3.2 oz (41.4 kg)    SpO2 93%    BMI 16.68 kg/m    Physical Exam: Constitutional: She actually appears chronically ill, but does not appear sick today.  She is somewhat cachectic/thin.  Abdomen: Previous surgical scars, no evidence of fascial defect.  No masses or tenderness.  Soft and benign.  No complaints of pain or tenderness. Skin: Right axillary wound appears to be very well granulated, there is no induration, erythema or discharge present.  There is no malodor.  Assessment/Plan: This is a 69 y.o. female 2 weeks s/p right axillary abscess I&D. May be having some loose stools/transition off of her regimen antibiotics.  She has no abdominal tenderness to make 78 suspicious there is some other process intra-abdominal.  We discussed hydration, continue to monitor how she feels and consideration of follow-up with her PCP.  Patient Active Problem List   Diagnosis Date Noted   Insomnia 11/24/2019   Acute left ankle pain 10/27/2019   Anastomotic stricture of colorectal region    Benign positional vertigo 01/09/2019   History of bowel diversion surgery    Kidney stone 12/08/2017   Anxiety 05/20/2017   Allergy 05/20/2017   H/O total hysterectomy 05/12/2016    Hyperlipidemia 05/08/2015   Depression 05/08/2015   Hypothyroidism 05/08/2015    -We will be glad to see her again should further concerns arise.   07/08/2015 M.D., FACS 07/31/2020, 12:27 PM

## 2020-07-31 NOTE — Telephone Encounter (Signed)
Requested medications are due for refill today?  Yes - This medication refill cannot be delegated.    Requested medications are on active medication list?  Yes   Last Refill:  05/27/2020  # 30 with one refill   Future visit scheduled? No   Notes to Clinic:  This medication refill cannot be delegated.

## 2020-08-03 ENCOUNTER — Other Ambulatory Visit: Payer: Self-pay | Admitting: Family Medicine

## 2020-08-03 NOTE — Telephone Encounter (Signed)
Requested Prescriptions  Pending Prescriptions Disp Refills  . traZODone (DESYREL) 100 MG tablet [Pharmacy Med Name: TRAZODONE 100 MG TABLET] 90 tablet 0    Sig: TAKE 0.5-1 TABLETS (50-100 MG TOTAL) BY MOUTH AT BEDTIME AS NEEDED FOR SLEEP.     Psychiatry: Antidepressants - Serotonin Modulator Passed - 08/03/2020  5:08 PM      Passed - Completed PHQ-2 or PHQ-9 in the last 360 days      Passed - Valid encounter within last 6 months    Recent Outpatient Visits          2 weeks ago Abscess of right axilla   [facility] [place], Megan P, DO   5 months ago Upper respiratory tract infection, unspecified type   [facility] [place], Megan P, DO   5 months ago Chronic right shoulder pain   [facility] [place], Megan P, DO   7 months ago Upper respiratory tract infection, unspecified type   [facility], Megan P, DO   7 months ago Psychophysiological insomnia   [facility] [place], [place], DO

## 2020-08-14 ENCOUNTER — Telehealth: Payer: Self-pay

## 2020-08-14 ENCOUNTER — Encounter: Payer: Self-pay | Admitting: Surgery

## 2020-08-14 ENCOUNTER — Other Ambulatory Visit: Payer: Self-pay

## 2020-08-14 ENCOUNTER — Ambulatory Visit (INDEPENDENT_AMBULATORY_CARE_PROVIDER_SITE_OTHER): Payer: Medicare HMO | Admitting: Surgery

## 2020-08-14 VITALS — BP 146/61 | HR 92 | Temp 98.3°F | Ht 62.0 in | Wt 94.2 lb

## 2020-08-14 DIAGNOSIS — Z09 Encounter for follow-up examination after completed treatment for conditions other than malignant neoplasm: Secondary | ICD-10-CM

## 2020-08-14 NOTE — Telephone Encounter (Signed)
Unable to lvm to let pt know some one will reach out to her to let her know the time, wanted to let her know she is on the list.

## 2020-08-14 NOTE — Telephone Encounter (Signed)
Sent a secure message to practice admin to get on the list for the mammogram truck as well as put on list at the front desk.

## 2020-08-14 NOTE — Telephone Encounter (Signed)
Copied from CRM [id number]. Topic: Referral - Request for Referral >> Aug 14, 2020 10:25 AM Lyn Hollingshead D wrote:   Has patient seen PCP for this complaint? YES  *If NO, is insurance requiring patient see PCP for this issue before PCP can refer them? Referral for which specialty: Mammogram  Preferred provider/office: Any  Reason for referral: Mammogram

## 2020-08-14 NOTE — Telephone Encounter (Signed)
Called and spoke with patient. She is wanting to be added to he mammogram truck. States she was cleared by one of her other providers to go ahead and have a mammogram done. Routing to admin to be added to the list and to provider as an [place].

## 2020-08-14 NOTE — Telephone Encounter (Signed)
Pt states she needs early early that day because she gets off work at 7 am.  So if that is not a good day, change to the next time.

## 2020-08-14 NOTE — Patient Instructions (Signed)
We will notify you in January 2022 to schedule your mammogram and follow up appointment with Dr. Everlene Farrier in Feb 2022. If you do not hear from our office by the end of January, call our office. Please continue to wash your wound daily and apply a bandage until completely healed.

## 2020-08-15 ENCOUNTER — Encounter: Payer: Self-pay | Admitting: Surgery

## 2020-08-15 NOTE — Progress Notes (Signed)
Amylases status post I&D of complex right axillary abscess.  She is doing very well.  No fevers no chills.  She did have a prior visit with my partner Dr. Claudine Mouton for dehydration.  This was not related to her surgical procedure. She is doing very well has no complaints today   physical exam: No acute distress.  Breast exam shows no evidence of malignancy no evidence of axillary lymphadenopathy.  Please note that chaperone was present.  The right axillary wound is healing very well and now only measuring 3 x 3 mm of good granulation tissue.  A/P doing very well.  We will see her in about 3 months once she completes a formal mammogram.  No need for any antibiotics or any further procedures at this time.

## 2020-08-26 ENCOUNTER — Other Ambulatory Visit: Payer: Self-pay | Admitting: Family Medicine

## 2020-08-26 DIAGNOSIS — Z1231 Encounter for screening mammogram for malignant neoplasm of breast: Secondary | ICD-10-CM

## 2020-09-22 ENCOUNTER — Other Ambulatory Visit: Payer: Self-pay | Admitting: Family Medicine

## 2020-09-23 ENCOUNTER — Telehealth: Payer: Self-pay

## 2020-09-23 NOTE — Telephone Encounter (Signed)
PA submitted via cover my meds for cyclobenzaprine. Awaiting approval or denial.  Key [id number]

## 2020-10-05 ENCOUNTER — Other Ambulatory Visit: Payer: Self-pay | Admitting: Gastroenterology

## 2020-10-05 ENCOUNTER — Other Ambulatory Visit: Payer: Self-pay | Admitting: Family Medicine

## 2020-10-05 DIAGNOSIS — M25572 Pain in left ankle and joints of left foot: Secondary | ICD-10-CM

## 2020-10-05 NOTE — Telephone Encounter (Signed)
Requested medication (s) are due for refill today:yes  Requested medication (s) are on the active medication list: yes   Last refill: 07/31/20   #30  1 refill  Future visit scheduled no  Notes to clinic: not delegated  Requested Prescriptions  Pending Prescriptions Disp Refills   cyclobenzaprine (FLEXERIL) 5 MG tablet [Pharmacy Med Name: CYCLOBENZAPRINE 5 MG TABLET] 30 tablet 1    Sig: TAKE 1/2 OF A TABLET BY MOUTH TWICE A DAY AS NEEDED FOR MUSCLE SPASMS      Not Delegated - Analgesics:  Muscle Relaxants Failed - 10/05/2020  8:29 PM      Failed - This refill cannot be delegated      Passed - Valid encounter within last 6 months    Recent Outpatient Visits           2 months ago Abscess of right axilla   [facility] [place], Megan P, DO   7 months ago Upper respiratory tract infection, unspecified type   [facility], Megan P, DO   7 months ago Chronic right shoulder pain   [facility] [place], Megan P, DO   9 months ago Upper respiratory tract infection, unspecified type   [facility], Megan P, DO   10 months ago Psychophysiological insomnia   [facility] [place], [place], DO

## 2020-10-07 NOTE — Telephone Encounter (Signed)
Lvm to make this apt. 

## 2020-10-07 NOTE — Telephone Encounter (Signed)
Has not been seen recently, does not have future appointment scheduled.

## 2020-10-07 NOTE — Telephone Encounter (Signed)
appt

## 2020-10-08 NOTE — Telephone Encounter (Signed)
Pt schedule virtual appt with Dr. Laural Benes for tomorrow at 10:20am / Pt asked if her refill for traZODone (DESYREL) 100 MG tablet Can be sent/ please advise

## 2020-10-08 NOTE — Telephone Encounter (Signed)
Lvm to make this apt. 

## 2020-10-09 ENCOUNTER — Other Ambulatory Visit: Payer: Self-pay

## 2020-10-09 ENCOUNTER — Telehealth: Payer: Medicare Other | Admitting: Family Medicine

## 2020-10-09 DIAGNOSIS — M25572 Pain in left ankle and joints of left foot: Secondary | ICD-10-CM

## 2020-10-09 MED ORDER — BUPROPION HCL ER (SR) 150 MG PO TB12: 150.0000 mg | ORAL_TABLET | Freq: Two times a day (BID) | ORAL | 0 refills | Status: DC

## 2020-10-09 MED ORDER — PANTOPRAZOLE SODIUM 40 MG PO TBEC
40.0000 mg | DELAYED_RELEASE_TABLET | Freq: Two times a day (BID) | ORAL | 0 refills | Status: DC
Start: 2020-10-09 — End: 2020-12-24

## 2020-10-09 MED ORDER — ROSUVASTATIN CALCIUM 10 MG PO TABS: 10.0000 mg | ORAL_TABLET | Freq: Every day | ORAL | 0 refills | Status: DC

## 2020-10-09 MED ORDER — ALBUTEROL SULFATE HFA 108 (90 BASE) MCG/ACT IN AERS: 2.0000 | INHALATION_SPRAY | Freq: Four times a day (QID) | RESPIRATORY_TRACT | 3 refills | Status: DC | PRN

## 2020-10-09 NOTE — Telephone Encounter (Signed)
Pt asking for refills of her medications to bridge her over for her physical on 11/05/20 due to rescheduling virtual due to provider unable to be in office and due for in office apt.

## 2020-10-09 NOTE — Telephone Encounter (Signed)
Routing to provider. Marked medications that are due for refill.

## 2020-10-09 NOTE — Addendum Note (Signed)
Addended by: Pablo Ledger on: 10/09/2020 10:27 AM   Modules accepted: Orders

## 2020-10-17 ENCOUNTER — Other Ambulatory Visit: Payer: Self-pay | Admitting: Family Medicine

## 2020-11-01 ENCOUNTER — Telehealth: Payer: Self-pay

## 2020-11-01 NOTE — Telephone Encounter (Signed)
Copied from CRM [id number]. Topic: General - Other >> Nov 01, 2020  1:03 PM Gaetana Michaelis A wrote: Reason for CRM: Patient has called in to inquire about the scheduling of a mammogram Patient would like to be advised by PCP about potentially having it done in the office when the mobile examination unit visits   FYI I left vm we have added her to the mammogram truck per practice admin no current date yet as to when it will be here will add pt to list. S.Rionna Feltes

## 2020-11-05 ENCOUNTER — Encounter: Payer: Medicare Other | Admitting: Family Medicine

## 2020-11-28 ENCOUNTER — Other Ambulatory Visit: Payer: Self-pay | Admitting: Family Medicine

## 2020-12-12 ENCOUNTER — Other Ambulatory Visit: Payer: Self-pay | Admitting: Family Medicine

## 2020-12-12 NOTE — Telephone Encounter (Signed)
Requested Prescriptions  Pending Prescriptions Disp Refills  . levothyroxine (SYNTHROID) 25 MCG tablet [Pharmacy Med Name: LEVOTHYROXINE 25 MCG TABLET] 90 tablet 3    Sig: TAKE 1 TABLET BY MOUTH EVERY DAY BEFORE BREAKFAST     Endocrinology:  Hypothyroid Agents Failed - 12/12/2020  9:01 AM      Failed - TSH needs to be rechecked within 3 months after an abnormal result. Refill until TSH is due.      Failed - TSH in normal range and within 360 days    TSH  Date Value Ref Range Status  11/24/2019 1.320 0.450 - 4.500 uIU/mL Final         Passed - Valid encounter within last 12 months    Recent Outpatient Visits          4 months ago Abscess of right axilla   [facility] [place], Megan P, DO   9 months ago Upper respiratory tract infection, unspecified type   [facility], Megan P, DO   10 months ago Chronic right shoulder pain   [facility] [place], Megan P, DO   11 months ago Upper respiratory tract infection, unspecified type   [facility], Megan P, DO   1 year ago Psychophysiological insomnia   [facility] [place], [place], DO      Future Appointments            In 1 week Laural Benes, Oralia Rud, DO [employer], [facility]

## 2020-12-19 ENCOUNTER — Encounter: Payer: Medicare Other | Admitting: Family Medicine

## 2020-12-24 ENCOUNTER — Other Ambulatory Visit: Payer: Self-pay

## 2020-12-24 ENCOUNTER — Ambulatory Visit (INDEPENDENT_AMBULATORY_CARE_PROVIDER_SITE_OTHER): Payer: Medicare Other | Admitting: Family Medicine

## 2020-12-24 ENCOUNTER — Encounter: Payer: Self-pay | Admitting: Family Medicine

## 2020-12-24 VITALS — BP 134/72 | HR 80 | Temp 98.2°F | Ht 61.5 in | Wt 97.0 lb

## 2020-12-24 DIAGNOSIS — E039 Hypothyroidism, unspecified: Secondary | ICD-10-CM | POA: Diagnosis not present

## 2020-12-24 DIAGNOSIS — Z13820 Encounter for screening for osteoporosis: Secondary | ICD-10-CM

## 2020-12-24 DIAGNOSIS — Z720 Tobacco use: Secondary | ICD-10-CM | POA: Diagnosis not present

## 2020-12-24 DIAGNOSIS — Z1231 Encounter for screening mammogram for malignant neoplasm of breast: Secondary | ICD-10-CM

## 2020-12-24 DIAGNOSIS — E785 Hyperlipidemia, unspecified: Secondary | ICD-10-CM

## 2020-12-24 DIAGNOSIS — F3342 Major depressive disorder, recurrent, in full remission: Secondary | ICD-10-CM

## 2020-12-24 DIAGNOSIS — Z0000 Encounter for general adult medical examination without abnormal findings: Secondary | ICD-10-CM | POA: Diagnosis not present

## 2020-12-24 LAB — URINALYSIS, ROUTINE W REFLEX MICROSCOPIC
Bilirubin, UA: NEGATIVE
Glucose, UA: NEGATIVE
Ketones, UA: NEGATIVE
Leukocytes,UA: NEGATIVE
Nitrite, UA: NEGATIVE
Protein,UA: NEGATIVE
Specific Gravity, UA: >1.03 — ABNORMAL HIGH (ref 1.005–1.030)
Urobilinogen, Ur: 0.2 mg/dL (ref 0.2–1.0)
pH, UA: 6 (ref 5.0–7.5)

## 2020-12-24 LAB — MICROSCOPIC EXAMINATION
Bacteria, UA: NONE SEEN
WBC, UA: NONE SEEN /hpf (ref 0–5)

## 2020-12-24 MED ORDER — PANTOPRAZOLE SODIUM 40 MG PO TBEC: 40.0000 mg | DELAYED_RELEASE_TABLET | Freq: Two times a day (BID) | ORAL | 1 refills | Status: DC

## 2020-12-24 MED ORDER — GABAPENTIN 300 MG PO CAPS: 300.0000 mg | ORAL_CAPSULE | Freq: Three times a day (TID) | ORAL | 3 refills | Status: DC

## 2020-12-24 MED ORDER — TRAZODONE HCL 100 MG PO TABS: 100.0000 mg | ORAL_TABLET | Freq: Every evening | ORAL | 1 refills | Status: DC | PRN

## 2020-12-24 MED ORDER — ALBUTEROL SULFATE HFA 108 (90 BASE) MCG/ACT IN AERS: 2.0000 | INHALATION_SPRAY | Freq: Four times a day (QID) | RESPIRATORY_TRACT | 3 refills | Status: DC | PRN

## 2020-12-24 MED ORDER — BUPROPION HCL ER (SR) 150 MG PO TB12: 150.0000 mg | ORAL_TABLET | Freq: Two times a day (BID) | ORAL | 1 refills | Status: DC

## 2020-12-24 MED ORDER — ROSUVASTATIN CALCIUM 10 MG PO TABS
10.0000 mg | ORAL_TABLET | Freq: Every day | ORAL | 1 refills | Status: DC
Start: 2020-12-24 — End: 2021-12-15

## 2020-12-24 NOTE — Assessment & Plan Note (Signed)
Under good control on current regimen. Continue current regimen. Continue to monitor. Call with any concerns. Refills given. Labs drawn today.   

## 2020-12-24 NOTE — Progress Notes (Signed)
BP 134/72   Pulse 80   Temp 98.2 F (36.8 C)   Ht 5' 1.5" (1.562 m)   Wt 97 lb (44 kg)   SpO2 99%   BMI 18.03 kg/m    Subjective:    Patient ID: Annette Miller, female    DOB: 01-Aug-1951, 70 y.o.   MRN: [id number]  HPI: Annette Miller is a 70 y.o. female presenting on 12/24/2020 for comprehensive medical examination. Current medical complaints include:  HYPOTHYROIDISM Thyroid control status:controlled Satisfied with current treatment? no Medication side effects: no Medication compliance: excellent compliance Etiology of hypothyroidism:  Recent dose adjustment:no Fatigue: no Cold intolerance: no Heat intolerance: no Weight gain: no Weight loss: no Constipation: no Diarrhea/loose stools: no Palpitations: no Lower extremity edema: no Anxiety/depressed mood: no  DEPRESSION Mood status: controlled Satisfied with current treatment?: yes Symptom severity: mild  Duration of current treatment : chronic Side effects: no Medication compliance: excellent compliance Psychotherapy/counseling: no  Previous psychiatric medications: wellbutrin Depressed mood: yes Anxious mood: yes Anhedonia: no Significant weight loss or gain: no Insomnia: yes Fatigue: yes Feelings of worthlessness or guilt: no Impaired concentration/indecisiveness: no Suicidal ideations: no Hopelessness: no Crying spells: no Depression screen [facility] 2/9 12/24/2020 07/16/2020 03/04/2020 11/24/2019 04/03/2019  Decreased Interest 0 0 0 0 0  Down, Depressed, Hopeless 1 0 0 0 0  PHQ - 2 Score 1 0 0 0 0  Altered sleeping 3 0 3 3 -  Tired, decreased energy 3 0 0 2 -  Change in appetite 1 0 2 0 -  Feeling bad or failure about yourself  0 0 0 0 -  Trouble concentrating 0 0 0 0 -  Moving slowly or fidgety/restless 0 0 1 0 -  Suicidal thoughts 0 0 0 0 -  PHQ-9 Score 8 0 6 5 -  Difficult doing work/chores Not difficult at all Not difficult at all Not difficult at all Not difficult at all -    HYPERLIPIDEMIA Hyperlipidemia status: stable Satisfied with current treatment?  yes Side effects:  no Medication compliance: excellent compliance Past cholesterol meds: crestor Supplements: none Aspirin:  no The 10-year ASCVD risk score Mikey Bussing DC Jr., et al., 2013) is: 15.2%   Values used to calculate the score:     Age: 70 years     Sex: Female     Is Non-Hispanic African American: No     Diabetic: No     Tobacco smoker: Yes     Systolic Blood Pressure: 660 mmHg     Is BP treated: No     HDL Cholesterol: 52 mg/dL     Total Cholesterol: 192 mg/dL Chest pain:  no  Menopausal Symptoms: no  Depression Screen done today and results listed below:  Depression screen [facility] 2/9 12/24/2020 07/16/2020 03/04/2020 11/24/2019 04/03/2019  Decreased Interest 0 0 0 0 0  Down, Depressed, Hopeless 1 0 0 0 0  PHQ - 2 Score 1 0 0 0 0  Altered sleeping 3 0 3 3 -  Tired, decreased energy 3 0 0 2 -  Change in appetite 1 0 2 0 -  Feeling bad or failure about yourself  0 0 0 0 -  Trouble concentrating 0 0 0 0 -  Moving slowly or fidgety/restless 0 0 1 0 -  Suicidal thoughts 0 0 0 0 -  PHQ-9 Score 8 0 6 5 -  Difficult doing work/chores Not difficult at all Not difficult at all Not difficult at all Not difficult at all -  Past Medical History:  Past Medical History:  Diagnosis Date  . Allergy   . Anemia    improved since surgery in May 2019  . Anxiety   . Arthritis   . Depression   . GERD (gastroesophageal reflux disease) 2019   PUD, history of diverticulitis  . Goiter   . H/O cardiac arrest 03/2018   s/p surgery and while in hospital. no cardiac history  . Headache   . History of blood transfusion 01/2018  . Hyperlipidemia   . Hypothyroidism   . Osteoporosis     Surgical History:  Past Surgical History:  Procedure Laterality Date  . ABDOMINAL HYSTERECTOMY  1979  . APPENDECTOMY  01/2018  . COLONOSCOPY WITH PROPOFOL N/A 01/14/2018   Procedure: COLONOSCOPY WITH PROPOFOL;  Surgeon:  Lin Landsman, MD;  Location: [facility] ENDOSCOPY;  Service: Gastroenterology;  Laterality: N/A;  . COLONOSCOPY WITH PROPOFOL N/A 06/24/2018   Procedure: COLONOSCOPY WITH PROPOFOL;  Surgeon: Jonathon Bellows, MD;  Location: [facility] ENDOSCOPY;  Service: Gastroenterology;  Laterality: N/A;  . COLONOSCOPY WITH PROPOFOL N/A 05/24/2019   Procedure: COLONOSCOPY WITH PROPOFOL;  Surgeon: Jonathon Bellows, MD;  Location: [facility] ENDOSCOPY;  Service: Gastroenterology;  Laterality: N/A;  . COLOSTOMY TAKEDOWN N/A 08/30/2018   Procedure: COLOSTOMY TAKEDOWN;  Surgeon: Jules Husbands, MD;  Location: [facility] ORS;  Service: General;  Laterality: N/A;  . DIAGNOSTIC LAPAROSCOPY    . ESOPHAGOGASTRODUODENOSCOPY N/A 04/06/2018   Procedure: ESOPHAGOGASTRODUODENOSCOPY (EGD);  Surgeon: Lin Landsman, MD;  Location: [facility] ENDOSCOPY;  Service: Gastroenterology;  Laterality: N/A;  . ESOPHAGOGASTRODUODENOSCOPY (EGD) WITH PROPOFOL N/A 01/14/2018   Procedure: ESOPHAGOGASTRODUODENOSCOPY (EGD) WITH PROPOFOL;  Surgeon: Lin Landsman, MD;  Location: [facility] ENDOSCOPY;  Service: Gastroenterology;  Laterality: N/A;  . ESOPHAGOGASTRODUODENOSCOPY (EGD) WITH PROPOFOL N/A 06/24/2018   Procedure: ESOPHAGOGASTRODUODENOSCOPY (EGD) WITH PROPOFOL;  Surgeon: Jonathon Bellows, MD;  Location: [facility] ENDOSCOPY;  Service: Gastroenterology;  Laterality: N/A;  . FLEXIBLE SIGMOIDOSCOPY N/A 06/28/2019   Procedure: FLEXIBLE SIGMOIDOSCOPY with Dilation;  Surgeon: Jonathon Bellows, MD;  Location: [facility] ENDOSCOPY;  Service: Gastroenterology;  Laterality: N/A;  . FLEXIBLE SIGMOIDOSCOPY N/A 07/20/2019   Procedure: FLEXIBLE SIGMOIDOSCOPY;  Surgeon: Jonathon Bellows, MD;  Location: [facility] ENDOSCOPY;  Service: Gastroenterology;  Laterality: N/A;  . INCISION AND DRAINAGE ABSCESS Right 07/18/2020   Procedure: INCISION AND DRAINAGE ABSCESS, axillary abscess;  Surgeon: Jules Husbands, MD;  Location: [facility] ORS;  Service: General;  Laterality: Right;  . LAPAROTOMY N/A 02/25/2018   Procedure:  EXPLORATORY LAPAROTOMY, PARTMAN'S PROCEDURE, APPENDECTOMY, COLOSTOMY;  Surgeon: Hadley Pen, DO;  Location: [facility] ORS;  Service: General;  Laterality: N/A;  . TONSILLECTOMY  1976  . VENTRAL HERNIA REPAIR N/A 08/30/2018   Procedure: HERNIA REPAIR VENTRAL ADULT;  Surgeon: Jules Husbands, MD;  Location: [facility] ORS;  Service: General;  Laterality: N/A;  . VISCERAL ANGIOGRAPHY N/A 04/07/2018   Procedure: VISCERAL ANGIOGRAPHY;  Surgeon: Algernon Huxley, MD;  Location: [place] CV LAB;  Service: Cardiovascular;  Laterality: N/A;    Medications:  Current Outpatient Medications on File Prior to Visit  Medication Sig  . acetaminophen (TYLENOL) 325 MG tablet Take 650 mg by mouth every 6 (six) hours as needed for mild pain or fever.  . Ascorbic Acid (VITAMIN C) 1000 MG tablet Take 3,000 mg by mouth daily.  . Calcium Carb-Cholecalciferol (OYSTER SHELL CALCIUM PLUS D PO) Take 1 tablet by mouth daily.   . cyclobenzaprine (FLEXERIL) 5 MG tablet TAKE 1/2 OF A TABLET BY MOUTH TWICE A DAY AS NEEDED FOR MUSCLE SPASMS  .  Docusate Sodium (COLACE PO) Take 1 Dose by mouth daily.  Marland Kitchen levothyroxine (SYNTHROID) 25 MCG tablet TAKE 1 TABLET BY MOUTH EVERY DAY BEFORE BREAKFAST  . magnesium 30 MG tablet Take 30 mg by mouth daily.   . Multiple Vitamin (MULTIVITAMIN WITH MINERALS) TABS tablet Take 1 tablet by mouth daily.  . ondansetron (ZOFRAN) 4 MG tablet Take 1 tablet as needed for nausea upto 2 times a day  . promethazine (PHENERGAN) 25 MG suppository Place 1 suppository (25 mg total) rectally every 6 (six) hours as needed for nausea or vomiting.  . Selenium 200 MCG CAPS Take 200 mcg by mouth daily.   Marland Kitchen triamcinolone cream (KENALOG) 0.1 % Apply 1 application topically 2 (two) times daily.  . vitamin B-12 (CYANOCOBALAMIN) 250 MCG tablet Take 250 mcg by mouth daily.  . vitamin E 1000 UNIT capsule Take 1,000 Units by mouth 2 (two) times daily.  . Zinc Sulfate (ZINC 15 PO) Take 15 mg by mouth daily.   . fexofenadine  (ALLEGRA ALLERGY) 180 MG tablet Take 1 tablet (180 mg total) by mouth daily. (Patient not taking: Reported on 12/24/2020)   No current facility-administered medications on file prior to visit.    Allergies:  Allergies  Allergen Reactions  . Aspirin     HISTORY OF PUD, DIVERTICULITIS, BOWEL PERFORATION  . Prednisone Other (See Comments)    Reaction: violence Can not take any kind of steroids  . Codeine Diarrhea and Nausea And Vomiting  . Tape Rash    Transpore tape: redness, irritation at site  Tegaderm somewhat okay. All tapes cause itching    Social History:  Social History   Socioeconomic History  . Marital status: Widowed    Spouse name: Not on file  . Number of children: Not on file  . Years of education: Not on file  . Highest education level: Associate degree: academic program  Occupational History    Comment: retired  . Occupation: [employer]     Comment: only on weekends   Tobacco Use  . Smoking status: Current Some Day Smoker    Packs/day: 0.10    Types: Cigarettes  . Smokeless tobacco: Never Used  . Tobacco comment: smokes occasionally   Vaping Use  . Vaping Use: Never used  Substance and Sexual Activity  . Alcohol use: No  . Drug use: No  . Sexual activity: Not on file  Other Topics Concern  . 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 Packs/day: 0.10  . Types: Cigarettes  Smokeless Tobacco Never Used  Tobacco Comment   smokes occasionally    Social History   Substance and Sexual Activity  Alcohol Use No    Family History:   Family History  Problem Relation Age of Onset  . Cancer Mother   . AAA (abdominal aortic aneurysm) Father   . Cancer Maternal Grandmother   . Stroke Maternal Grandmother   . Cancer Paternal Grandfather     Past medical history, surgical history, medications, allergies, family history and social history reviewed  with patient today and changes made to appropriate areas of the chart.   Review of Systems  Constitutional: Negative.   HENT: Negative.   Eyes: Negative.   Respiratory: Negative.   Cardiovascular: Negative.   Gastrointestinal: Positive for nausea. Negative for abdominal pain, blood in stool, constipation, diarrhea, heartburn, melena and vomiting.  Genitourinary: Negative.   Musculoskeletal: Negative.   Skin: Negative.   Neurological: Negative.   Endo/Heme/Allergies: Positive for environmental allergies. Negative for polydipsia. Does not bruise/bleed easily.  Psychiatric/Behavioral: Positive for depression. Negative for hallucinations, memory loss, substance abuse and suicidal ideas. The patient is nervous/anxious. The patient does not have insomnia.    All other ROS negative except what is listed above and in the HPI.      Objective:    BP 134/72   Pulse 80   Temp 98.2 F (36.8 C)   Ht 5' 1.5" (1.562 m)   Wt 97 lb (44 kg)   SpO2 99%   BMI 18.03 kg/m   Wt Readings from Last 3 Encounters:  12/24/20 97 lb (44 kg)  08/14/20 94 lb 3.2 oz (42.7 kg)  07/30/20 91 lb 3.2 oz (41.4 kg)    Physical Exam Vitals and nursing note reviewed.  Constitutional:      General: She is not in acute distress.    Appearance: Normal appearance. She is not ill-appearing, toxic-appearing or diaphoretic.  HENT:     Head: Normocephalic and atraumatic.     Right Ear: Tympanic membrane, ear canal and external ear normal. There is no impacted cerumen.     Left Ear: Tympanic membrane, ear canal and external ear normal. There is no impacted cerumen.     Nose: Nose normal. No congestion or  rhinorrhea.     Mouth/Throat:     Mouth: Mucous membranes are moist.     Pharynx: Oropharynx is clear. No oropharyngeal exudate or posterior oropharyngeal erythema.  Eyes:     General: No scleral icterus.       Right eye: No discharge.        Left eye: No discharge.     Extraocular Movements: Extraocular movements intact.     Conjunctiva/sclera: Conjunctivae normal.     Pupils: Pupils are equal, round, and reactive to light.  Neck:     Vascular: No carotid bruit.  Cardiovascular:     Rate and Rhythm: Normal rate and regular rhythm.     Pulses: Normal pulses.     Heart sounds: No murmur heard. No friction rub. No gallop.   Pulmonary:     Effort: Pulmonary effort is normal. No respiratory distress.     Breath sounds: Normal breath sounds. No stridor. No wheezing, rhonchi or rales.  Chest:     Chest wall: No tenderness.  Abdominal:     General: Abdomen is flat. Bowel sounds are normal. There is no distension.     Palpations: Abdomen is soft. There is no mass.     Tenderness: There is no abdominal tenderness. There is no right CVA tenderness, left CVA tenderness, guarding or rebound.     Hernia: No hernia is present.  Genitourinary:    Comments: Breast and pelvic exams deferred with shared decision making Musculoskeletal:        General: No swelling, tenderness, deformity or signs of injury.     Cervical back: Normal range of motion and neck supple. No rigidity. No muscular tenderness.     Right lower leg: No edema.     Left lower leg: No  edema.  Lymphadenopathy:     Cervical: No cervical adenopathy.  Skin:    General: Skin is warm and dry.     Capillary Refill: Capillary refill takes less than 2 seconds.     Coloration: Skin is not jaundiced or pale.     Findings: No bruising, erythema, lesion or rash.  Neurological:     General: No focal deficit present.     Mental Status: She is alert and oriented to person, place, and time. Mental status is at baseline.     Cranial  Nerves: No cranial nerve deficit.     Sensory: No sensory deficit.     Motor: No weakness.     Coordination: Coordination normal.     Gait: Gait normal.     Deep Tendon Reflexes: Reflexes normal.  Psychiatric:        Mood and Affect: Mood normal.        Behavior: Behavior normal.        Thought Content: Thought content normal.        Judgment: Judgment normal.     Results for orders placed or performed in visit on 12/24/20  Microscopic Examination   Urine  Result Value Ref Range   WBC, UA None seen 0 - 5 /hpf   RBC 0-2 0 - 2 /hpf   Epithelial Cells (non renal) 0-10 0 - 10 /hpf   Bacteria, UA None seen None seen/Few  CBC with Differential/Platelet  Result Value Ref Range   WBC 6.6 3.4 - 10.8 x10E3/uL   RBC 3.43 (L) 3.77 - 5.28 x10E6/uL   Hemoglobin 11.4 11.1 - 15.9 g/dL   Hematocrit 34.4 34.0 - 46.6 %   MCV 100 (H) 79 - 97 fL   MCH 33.2 (H) 26.6 - 33.0 pg   MCHC 33.1 31.5 - 35.7 g/dL   RDW 11.5 (L) 11.7 - 15.4 %   Platelets 298 150 - 450 x10E3/uL   Neutrophils 58 Not Estab. %   Lymphs 30 Not Estab. %   Monocytes 7 Not Estab. %   Eos 5 Not Estab. %   Basos 0 Not Estab. %   Neutrophils Absolute 3.8 1.4 - 7.0 x10E3/uL   Lymphocytes Absolute 2.0 0.7 - 3.1 x10E3/uL   Monocytes Absolute 0.5 0.1 - 0.9 x10E3/uL   EOS (ABSOLUTE) 0.3 0.0 - 0.4 x10E3/uL   Basophils Absolute 0.0 0.0 - 0.2 x10E3/uL   Immature Granulocytes 0 Not Estab. %   Immature Grans (Abs) 0.0 0.0 - 0.1 x10E3/uL  Comprehensive metabolic panel  Result Value Ref Range   Glucose 85 65 - 99 mg/dL   BUN 14 8 - 27 mg/dL   Creatinine, Ser 0.76 0.57 - 1.00 mg/dL   eGFR 85 >59 mL/min/1.73   BUN/Creatinine Ratio 18 12 - 28   Sodium 141 134 - 144 mmol/L   Potassium 4.3 3.5 - 5.2 mmol/L   Chloride 104 96 - 106 mmol/L   CO2 24 20 - 29 mmol/L   Calcium 8.8 8.7 - 10.3 mg/dL   Total Protein 6.5 6.0 - 8.5 g/dL   Albumin 4.1 3.8 - 4.8 g/dL   Globulin, Total 2.4 1.5 - 4.5 g/dL   Albumin/Globulin Ratio 1.7 1.2 - 2.2    Bilirubin Total <0.2 0.0 - 1.2 mg/dL   Alkaline Phosphatase 101 44 - 121 IU/L   AST 13 0 - 40 IU/L   ALT 6 0 - 32 IU/L  Lipid Panel w/o Chol/HDL Ratio  Result Value Ref Range   Cholesterol,  Total 192 100 - 199 mg/dL   Triglycerides 128 0 - 149 mg/dL   HDL 52 >39 mg/dL   VLDL Cholesterol Cal 23 5 - 40 mg/dL   LDL Chol Calc (NIH) 117 (H) 0 - 99 mg/dL  Urinalysis, Routine w reflex microscopic  Result Value Ref Range   Specific Gravity, UA >1.030 (H) 1.005 - 1.030   pH, UA 6.0 5.0 - 7.5   Color, UA Yellow Yellow   Appearance Ur Clear Clear   Leukocytes,UA Negative Negative   Protein,UA Negative Negative/Trace   Glucose, UA Negative Negative   Ketones, UA Negative Negative   RBC, UA 2+ (A) Negative   Bilirubin, UA Negative Negative   Urobilinogen, Ur 0.2 0.2 - 1.0 mg/dL   Nitrite, UA Negative Negative   Microscopic Examination See below:   TSH  Result Value Ref Range   TSH 1.150 0.450 - 4.500 uIU/mL      Assessment & Plan:   Problem List Items Addressed This Visit      Endocrine   Hypothyroidism    Rechecking labs today. Await results. Treat as needed.       Relevant Orders   CBC with Differential/Platelet (Completed)   Comprehensive metabolic panel (Completed)   TSH (Completed)     Other   Hyperlipidemia    Under good control on current regimen. Continue current regimen. Continue to monitor. Call with any concerns. Refills given. Labs drawn today.        Relevant Medications   rosuvastatin (CRESTOR) 10 MG tablet   Other Relevant Orders   Comprehensive metabolic panel (Completed)   Lipid Panel w/o Chol/HDL Ratio (Completed)   Depression    Under good control on current regimen. Continue current regimen. Continue to monitor. Call with any concerns. Refills given. Labs drawn today.       Relevant Medications   traZODone (DESYREL) 100 MG tablet   buPROPion (WELLBUTRIN SR) 150 MG 12 hr tablet   Other Relevant Orders   CBC with Differential/Platelet (Completed)    Comprehensive metabolic panel (Completed)    Other Visit Diagnoses    Routine general medical examination at a health care facility    -  Primary   Vaccines up to date. Screening labs checked today. Mammogram and DEXA ordered today. Continue diet and exercise. Call with any concerns.   Tobacco abuse       Not interested in quitting. Continue to monitor. Call with any concerns.    Relevant Orders   Urinalysis, Routine w reflex microscopic (Completed)   Encounter for screening mammogram for malignant neoplasm of breast       Mammogram ordered today.    Relevant Orders   MM 3D SCREEN BREAST BILATERAL   Screening for osteoporosis       DEXA ordered today.   Relevant Orders   DG Bone Density       Follow up plan: No follow-ups on file.   LABORATORY TESTING:  - Pap smear: not applicable  IMMUNIZATIONS:   - Tdap: Tetanus vaccination status reviewed: last tetanus booster within 10 years. - Influenza: Up to date - Pneumovax: Up to date - Prevnar: Up to date - COVID: Up to date  SCREENING: -Mammogram: Ordered today  - Colonoscopy: Up to date  - Bone Density: Ordered today   PATIENT COUNSELING:   Advised to take 1 mg of folate supplement per day if capable of pregnancy.   Sexuality: Discussed sexually transmitted diseases, partner selection, use of condoms, avoidance of unintended pregnancy  and contraceptive alternatives.   Advised to avoid cigarette smoking.  I discussed with the patient that most people either abstain from alcohol or drink within safe limits (<=14/week and <=4 drinks/occasion for males, <=7/weeks and <= 3 drinks/occasion for females) and that the risk for alcohol disorders and other health effects rises proportionally with the number of drinks per week and how often a drinker exceeds daily limits.  Discussed cessation/primary prevention of drug use and availability of treatment for abuse.   Diet: Encouraged to adjust caloric intake to maintain  or achieve  ideal body weight, to reduce intake of dietary saturated fat and total fat, to limit sodium intake by avoiding high sodium foods and not adding table salt, and to maintain adequate dietary potassium and calcium preferably from fresh fruits, vegetables, and low-fat dairy products.    stressed the importance of regular exercise  Injury prevention: Discussed safety belts, safety helmets, smoke detector, smoking near bedding or upholstery.   Dental health: Discussed importance of regular tooth brushing, flossing, and dental visits.    NEXT PREVENTATIVE PHYSICAL DUE IN 1 YEAR. No follow-ups on file.

## 2020-12-24 NOTE — Patient Instructions (Addendum)
Call to schedule your mammogram and DEXA [facility] at [facility]  Address: [address] [place], [place], [place] [address]  Phone: [phone]    Health Maintenance After Age 70 After age 29, you are at a higher risk for certain long-term diseases and infections as well as injuries from falls. Falls are a major cause of broken bones and head injuries in people who are older than age 52. Getting regular preventive care can help to keep you healthy and well. Preventive care includes getting regular testing and making lifestyle changes as recommended by your health care provider. Talk with your health care provider about:  Which screenings and tests you should have. A screening is a test that checks for a disease when you have no symptoms.  A diet and exercise plan that is right for you. What should I know about screenings and tests to prevent falls? Screening and testing are the best ways to find a health problem early. Early diagnosis and treatment give you the best chance of managing medical conditions that are common after age 69. Certain conditions and lifestyle choices may make you more likely to have a fall. Your health care provider may recommend:  Regular vision checks. Poor vision and conditions such as cataracts can make you more likely to have a fall. If you wear glasses, make sure to get your prescription updated if your vision changes.  Medicine review. Work with your health care provider to regularly review all of the medicines you are taking, including over-the-counter medicines. Ask your health care provider about any side effects that may make you more likely to have a fall. Tell your health care provider if any medicines that you take make you feel dizzy or sleepy.  Osteoporosis screening. Osteoporosis is a condition that causes the bones to get weaker. This can make the bones weak and cause them to break more easily.  Blood pressure screening. Blood  pressure changes and medicines to control blood pressure can make you feel dizzy.  Strength and balance checks. Your health care provider may recommend certain tests to check your strength and balance while standing, walking, or changing positions.  Foot health exam. Foot pain and numbness, as well as not wearing proper footwear, can make you more likely to have a fall.  Depression screening. You may be more likely to have a fall if you have a fear of falling, feel emotionally low, or feel unable to do activities that you used to do.  Alcohol use screening. Using too much alcohol can affect your balance and may make you more likely to have a fall. What actions can I take to lower my risk of falls? General instructions  Talk with your health care provider about your risks for falling. Tell your health care provider if: ? You fall. Be sure to tell your health care provider about all falls, even ones that seem minor. ? You feel dizzy, sleepy, or off-balance.  Take over-the-counter and prescription medicines only as told by your health care provider. These include any supplements.  Eat a healthy diet and maintain a healthy weight. A healthy diet includes low-fat dairy products, low-fat (lean) meats, and fiber from whole grains, beans, and lots of fruits and vegetables. Home safety  Remove any tripping hazards, such as rugs, cords, and clutter.  Install safety equipment such as grab bars in bathrooms and safety rails on stairs.  Keep rooms and walkways well-lit. Activity  Follow a regular exercise program to  stay fit. This will help you maintain your balance. Ask your health care provider what types of exercise are appropriate for you.  If you need a cane or walker, use it as recommended by your health care provider.  Wear supportive shoes that have nonskid soles.   Lifestyle  Do not drink alcohol if your health care provider tells you not to drink.  If you drink alcohol, limit how  much you have: ? 0-1 drink a day for women. ? 0-2 drinks a day for men.  Be aware of how much alcohol is in your drink. In the [place] one drink equals one typical bottle of beer (12 oz), one-half glass of wine (5 oz), or one shot of hard liquor (1 oz).  Do not use any products that contain nicotine or tobacco, such as cigarettes and e-cigarettes. If you need help quitting, ask your health care provider. Summary  Having a healthy lifestyle and getting preventive care can help to protect your health and wellness after age 46.  Screening and testing are the best way to find a health problem early and help you avoid having a fall. Early diagnosis and treatment give you the best chance for managing medical conditions that are more common for people who are older than age 52.  Falls are a major cause of broken bones and head injuries in people who are older than age 61. Take precautions to prevent a fall at home.  Work with your health care provider to learn what changes you can make to improve your health and wellness and to prevent falls. This information is not intended to replace advice given to you by your health care provider. Make sure you discuss any questions you have with your health care provider. Document Revised: 01/05/2019 Document Reviewed: 07/28/2017 Elsevier Patient Education  2021 [employer].

## 2020-12-24 NOTE — Assessment & Plan Note (Signed)
Rechecking labs today. Await results. Treat as needed.  °

## 2020-12-25 ENCOUNTER — Encounter: Payer: Self-pay | Admitting: Family Medicine

## 2020-12-25 LAB — CBC WITH DIFFERENTIAL/PLATELET
Basophils Absolute: 0 10*3/uL (ref 0.0–0.2)
Basos: 0 %
EOS (ABSOLUTE): 0.3 10*3/uL (ref 0.0–0.4)
Eos: 5 %
Hematocrit: 34.4 % (ref 34.0–46.6)
Hemoglobin: 11.4 g/dL (ref 11.1–15.9)
Immature Grans (Abs): 0 10*3/uL (ref 0.0–0.1)
Immature Granulocytes: 0 %
Lymphocytes Absolute: 2 10*3/uL (ref 0.7–3.1)
Lymphs: 30 %
MCH: 33.2 pg — ABNORMAL HIGH (ref 26.6–33.0)
MCHC: 33.1 g/dL (ref 31.5–35.7)
MCV: 100 fL — ABNORMAL HIGH (ref 79–97)
Monocytes Absolute: 0.5 10*3/uL (ref 0.1–0.9)
Monocytes: 7 %
Neutrophils Absolute: 3.8 10*3/uL (ref 1.4–7.0)
Neutrophils: 58 %
Platelets: 298 10*3/uL (ref 150–450)
RBC: 3.43 x10E6/uL — ABNORMAL LOW (ref 3.77–5.28)
RDW: 11.5 % — ABNORMAL LOW (ref 11.7–15.4)
WBC: 6.6 10*3/uL (ref 3.4–10.8)

## 2020-12-25 LAB — LIPID PANEL W/O CHOL/HDL RATIO
Cholesterol, Total: 192 mg/dL (ref 100–199)
HDL: 52 mg/dL (ref >39)
LDL Chol Calc (NIH): 117 mg/dL — ABNORMAL HIGH (ref 0–99)
Triglycerides: 128 mg/dL (ref 0–149)
VLDL Cholesterol Cal: 23 mg/dL (ref 5–40)

## 2020-12-25 LAB — COMPREHENSIVE METABOLIC PANEL
ALT: 6 IU/L (ref 0–32)
AST: 13 IU/L (ref 0–40)
Albumin/Globulin Ratio: 1.7 (ref 1.2–2.2)
Albumin: 4.1 g/dL (ref 3.8–4.8)
Alkaline Phosphatase: 101 IU/L (ref 44–121)
BUN/Creatinine Ratio: 18 (ref 12–28)
BUN: 14 mg/dL (ref 8–27)
Bilirubin Total: <0.2 mg/dL (ref 0.0–1.2)
CO2: 24 mmol/L (ref 20–29)
Calcium: 8.8 mg/dL (ref 8.7–10.3)
Chloride: 104 mmol/L (ref 96–106)
Creatinine, Ser: 0.76 mg/dL (ref 0.57–1.00)
Globulin, Total: 2.4 g/dL (ref 1.5–4.5)
Glucose: 85 mg/dL (ref 65–99)
Potassium: 4.3 mmol/L (ref 3.5–5.2)
Sodium: 141 mmol/L (ref 134–144)
Total Protein: 6.5 g/dL (ref 6.0–8.5)
eGFR: 85 mL/min/{1.73_m2} (ref >59)

## 2020-12-25 LAB — TSH: TSH: 1.15 u[IU]/mL (ref 0.450–4.500)

## 2021-02-03 ENCOUNTER — Encounter: Payer: Self-pay | Admitting: Family Medicine

## 2021-02-03 ENCOUNTER — Telehealth: Payer: Self-pay

## 2021-02-03 ENCOUNTER — Ambulatory Visit (INDEPENDENT_AMBULATORY_CARE_PROVIDER_SITE_OTHER): Payer: Medicare Other | Admitting: Family Medicine

## 2021-02-03 ENCOUNTER — Other Ambulatory Visit: Payer: Self-pay

## 2021-02-03 VITALS — BP 134/74 | HR 82 | Temp 99.6°F

## 2021-02-03 DIAGNOSIS — Z5329 Procedure and treatment not carried out because of patient's decision for other reasons: Secondary | ICD-10-CM

## 2021-02-03 NOTE — Telephone Encounter (Signed)
scheduled

## 2021-02-03 NOTE — Telephone Encounter (Signed)
Appt, phone is not ideal, but if only option.

## 2021-02-03 NOTE — Telephone Encounter (Signed)
Copied from CRM [id number]. Topic: General - Other >> Feb 03, 2021  8:18 AM [name] wrote: Reason for CRM: Patient called in to inform Dr Laural Benes that she is having a fever 100.2, slight sore throat, headache, hacking cough, and productive cough with green mucus. Took 3 Covid test that were all negative. Asking for an Rx to be sent to the pharmacy say that she can not do a virtual visit but can do a phone call. Please advise also say that a Zpak does not work Ph# [phone] Patient also asking for a copy of her immunization to be picked up when she is feeling better.  Would she be ok to come in the office or phone call?

## 2021-02-03 NOTE — Progress Notes (Signed)
Called patient 2x at requested number and [facility] for her to call back to do her appointment.

## 2021-02-04 ENCOUNTER — Ambulatory Visit (INDEPENDENT_AMBULATORY_CARE_PROVIDER_SITE_OTHER): Payer: Medicare Other | Admitting: Family Medicine

## 2021-02-04 ENCOUNTER — Encounter: Payer: Self-pay | Admitting: Family Medicine

## 2021-02-04 VITALS — Temp 99.2°F

## 2021-02-04 DIAGNOSIS — J069 Acute upper respiratory infection, unspecified: Secondary | ICD-10-CM | POA: Diagnosis not present

## 2021-02-04 MED ORDER — HYDROCOD POLST-CPM POLST ER 10-8 MG/5ML PO SUER: 5.0000 mL | Freq: Two times a day (BID) | ORAL | 0 refills | Status: DC | PRN

## 2021-02-04 MED ORDER — METHYLPREDNISOLONE 4 MG PO TBPK: ORAL_TABLET | ORAL | 0 refills | Status: DC

## 2021-02-04 MED ORDER — BENZONATATE 200 MG PO CAPS
200.0000 mg | ORAL_CAPSULE | Freq: Two times a day (BID) | ORAL | 0 refills | Status: DC | PRN
Start: 2021-02-04 — End: 2021-02-25

## 2021-02-04 NOTE — Progress Notes (Signed)
Temp 99.2 F (37.3 C)    Subjective:    Patient ID: Annette Miller, female    DOB: [birth year], 69 y.o.   MRN: [id number]  HPI: Annette Miller is a 70 y.o. female  Chief Complaint  Patient presents with  . Cough    Patient took 3 rapid covid test and they were negative. Patient is coughing, nasal congestion, ear ache, and sore throat. Patient is also having nausea and vomit.    UPPER RESPIRATORY TRACT INFECTION Duration: 5 days Worst symptom: congestion, fever, cough Fever: yes Cough: yes Shortness of breath: no Wheezing: yes Chest pain: no Chest tightness: no Chest congestion: no Nasal congestion: yes Runny nose: yes Post nasal drip: yes Sneezing: no Sore throat: yes Swollen glands: no Sinus pressure: yes Headache: yes Face pain: no Toothache: no Ear pain: no  Ear pressure: yes- both  Eyes red/itching:yes Eye drainage/crusting: no  Vomiting: yes Rash: no Fatigue: yes Sick contacts: yes Strep contacts: no  Context: stable Recurrent sinusitis: no Relief with OTC cold/cough medications: no  Treatments attempted: none    Relevant past medical, surgical, family and social history reviewed and updated as indicated. Interim medical history since our last visit reviewed. Allergies and medications reviewed and updated.  Review of Systems  Constitutional: Positive for appetite change, chills, diaphoresis, fatigue and fever. Negative for activity change and unexpected weight change.  HENT: Positive for postnasal drip, rhinorrhea and sinus pressure. Negative for dental problem, drooling, ear discharge, ear pain, facial swelling, hearing loss, mouth sores, nosebleeds, sinus pain, sneezing, sore throat, tinnitus, trouble swallowing and voice change.   Respiratory: Positive for cough and shortness of breath. Negative for apnea, choking, chest tightness, wheezing and stridor.   Cardiovascular: Negative.   Gastrointestinal: Positive for nausea and vomiting. Negative for  abdominal distention, abdominal pain, anal bleeding, blood in stool, constipation, diarrhea and rectal pain.  Musculoskeletal: Negative.   Psychiatric/Behavioral: Negative.     Per HPI unless specifically indicated above     Objective:    Temp 99.2 F (37.3 C)   Wt Readings from Last 3 Encounters:  12/24/20 97 lb (44 kg)  08/14/20 94 lb 3.2 oz (42.7 kg)  07/30/20 91 lb 3.2 oz (41.4 kg)    Physical Exam Vitals and nursing note reviewed.  Pulmonary:     Effort: Pulmonary effort is normal. No respiratory distress.     Comments: Speaking in full sentences Neurological:     Mental Status: She is alert.  Psychiatric:        Mood and Affect: Mood normal.        Behavior: Behavior normal.        Thought Content: Thought content normal.        Judgment: Judgment normal.     Results for orders placed or performed in visit on 12/24/20  Microscopic Examination   Urine  Result Value Ref Range   WBC, UA None seen 0 - 5 /hpf   RBC 0-2 0 - 2 /hpf   Epithelial Cells (non renal) 0-10 0 - 10 /hpf   Bacteria, UA None seen None seen/Few  CBC with Differential/Platelet  Result Value Ref Range   WBC 6.6 3.4 - 10.8 x10E3/uL   RBC 3.43 (L) 3.77 - 5.28 x10E6/uL   Hemoglobin 11.4 11.1 - 15.9 g/dL   Hematocrit 34.4 34.0 - 46.6 %   MCV 100 (H) 79 - 97 fL   MCH 33.2 (H) 26.6 - 33.0 pg   MCHC 33.1 31.5 -  35.7 g/dL   RDW 11.5 (L) 11.7 - 15.4 %   Platelets 298 150 - 450 x10E3/uL   Neutrophils 58 Not Estab. %   Lymphs 30 Not Estab. %   Monocytes 7 Not Estab. %   Eos 5 Not Estab. %   Basos 0 Not Estab. %   Neutrophils Absolute 3.8 1.4 - 7.0 x10E3/uL   Lymphocytes Absolute 2.0 0.7 - 3.1 x10E3/uL   Monocytes Absolute 0.5 0.1 - 0.9 x10E3/uL   EOS (ABSOLUTE) 0.3 0.0 - 0.4 x10E3/uL   Basophils Absolute 0.0 0.0 - 0.2 x10E3/uL   Immature Granulocytes 0 Not Estab. %   Immature Grans (Abs) 0.0 0.0 - 0.1 x10E3/uL  Comprehensive metabolic panel  Result Value Ref Range   Glucose 85 65 - 99 mg/dL    BUN 14 8 - 27 mg/dL   Creatinine, Ser 0.76 0.57 - 1.00 mg/dL   eGFR 85 >59 mL/min/1.73   BUN/Creatinine Ratio 18 12 - 28   Sodium 141 134 - 144 mmol/L   Potassium 4.3 3.5 - 5.2 mmol/L   Chloride 104 96 - 106 mmol/L   CO2 24 20 - 29 mmol/L   Calcium 8.8 8.7 - 10.3 mg/dL   Total Protein 6.5 6.0 - 8.5 g/dL   Albumin 4.1 3.8 - 4.8 g/dL   Globulin, Total 2.4 1.5 - 4.5 g/dL   Albumin/Globulin Ratio 1.7 1.2 - 2.2   Bilirubin Total <0.2 0.0 - 1.2 mg/dL   Alkaline Phosphatase 101 44 - 121 IU/L   AST 13 0 - 40 IU/L   ALT 6 0 - 32 IU/L  Lipid Panel w/o Chol/HDL Ratio  Result Value Ref Range   Cholesterol, Total 192 100 - 199 mg/dL   Triglycerides 128 0 - 149 mg/dL   HDL 52 >39 mg/dL   VLDL Cholesterol Cal 23 5 - 40 mg/dL   LDL Chol Calc (NIH) 117 (H) 0 - 99 mg/dL  Urinalysis, Routine w reflex microscopic  Result Value Ref Range   Specific Gravity, UA >1.030 (H) 1.005 - 1.030   pH, UA 6.0 5.0 - 7.5   Color, UA Yellow Yellow   Appearance Ur Clear Clear   Leukocytes,UA Negative Negative   Protein,UA Negative Negative/Trace   Glucose, UA Negative Negative   Ketones, UA Negative Negative   RBC, UA 2+ (A) Negative   Bilirubin, UA Negative Negative   Urobilinogen, Ur 0.2 0.2 - 1.0 mg/dL   Nitrite, UA Negative Negative   Microscopic Examination See below:   TSH  Result Value Ref Range   TSH 1.150 0.450 - 4.500 uIU/mL      Assessment & Plan:   Problem List Items Addressed This Visit   None   Visit Diagnoses    Viral upper respiratory tract infection    -  Primary   Will treat with medrol dose pack, tussionex and tessalon. If does not feel good on medrol- STOP and call, send in ativan, has never taken it. Call w/ concerns       Follow up plan: Return if symptoms worsen or fail to improve.   . This visit was completed via telephone due to the restrictions of the COVID-19 pandemic. All issues as above were discussed and addressed but no physical exam was performed. If it was felt  that the patient should be evaluated in the office, they were directed there. The patient verbally consented to this visit. Patient was unable to complete an audio/visual visit due to Lack of equipment. Due to the  catastrophic nature of the COVID-19 pandemic, this visit was done through audio contact only. . Location of the patient: home . Location of the provider: work . Those involved with this call:  . Provider: Park Liter, DO . CMA: Louanna Raw, [place] . Front Desk/Registration: Jill Side  . Time spent on call: 22 minutes on the phone discussing health concerns. 30 minutes total spent in review of patient's record and preparation of their chart.

## 2021-02-11 ENCOUNTER — Encounter: Payer: Self-pay | Admitting: Internal Medicine

## 2021-02-11 ENCOUNTER — Other Ambulatory Visit: Payer: Self-pay

## 2021-02-11 ENCOUNTER — Ambulatory Visit (INDEPENDENT_AMBULATORY_CARE_PROVIDER_SITE_OTHER): Payer: Medicare Other | Admitting: Internal Medicine

## 2021-02-11 VITALS — BP 125/86 | HR 106 | Temp 98.8°F | Ht 61.5 in | Wt 95.4 lb

## 2021-02-11 DIAGNOSIS — L0291 Cutaneous abscess, unspecified: Secondary | ICD-10-CM | POA: Diagnosis not present

## 2021-02-11 MED ORDER — SULFAMETHOXAZOLE-TRIMETHOPRIM 800-160 MG PO TABS: 1.0000 | ORAL_TABLET | Freq: Two times a day (BID) | ORAL | 0 refills | Status: AC

## 2021-02-11 MED ORDER — TRAMADOL HCL 50 MG PO TABS: 50.0000 mg | ORAL_TABLET | Freq: Two times a day (BID) | ORAL | 0 refills | Status: AC | PRN

## 2021-02-11 NOTE — Progress Notes (Signed)
   BP 125/86   Pulse (!) 106   Temp 98.8 F (37.1 C) (Oral)   Ht 5' 1.5" (1.562 m)   Wt 95 lb 6.4 oz (43.3 kg)   SpO2 95%   BMI 17.74 kg/m    Subjective:    Patient ID: Annette Miller, female    DOB: 17-Mar-1951, 70 y.o.   MRN: [id number]  HPI: Annette Miller is a 70 y.o. female  Rt buttock pain / swelling  x Friday, fever ? Says it got to a 98.5 , 100. 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 The affected locations include the right buttock. The rash is characterized by redness, pain and itchiness (no discharge no draining noted. ).    Chief Complaint  Patient presents with  . Cyst    On center buttock closer to left since friday  . Ear Pain    Relevant past medical, surgical, family and social history reviewed and updated as indicated. Interim medical history since our last visit reviewed. Allergies and medications reviewed and updated.  Review of Systems  Skin: Positive for rash.    Per HPI unless specifically indicated above     Objective:    BP 125/86   Pulse (!) 106   Temp 98.8 F (37.1 C) (Oral)   Ht 5' 1.5" (1.562 m)   Wt 95 lb 6.4 oz (43.3 kg)   SpO2 95%   BMI 17.74 kg/m   Wt Readings from Last 3 Encounters:  02/11/21 95 lb 6.4 oz (43.3 kg)  12/24/20 97 lb (44 kg)  08/14/20 94 lb 3.2 oz (42.7 kg)    Physical Exam Constitutional:      General: She is not in acute distress.    Appearance: Normal appearance. She is not ill-appearing or toxic-appearing.  Musculoskeletal:        General: No swelling, tenderness, deformity or signs of injury.     Right lower leg: No edema.  Skin:    Coloration: Skin is not jaundiced or pale.     Findings: Erythema and lesion present. No bruising or rash.  Neurological:     Mental Status: She is alert.              Assessment & Plan:  Rt buttock abscess :  Will need to start pt on bactrim  Tramadol for pain  Has a ho  gastric ulcer no NSAIDS sec to such  If doesn't heal check for diabetes.   Problem List Items Addressed This Visit   None      Follow up plan: No follow-ups on file.

## 2021-02-21 ENCOUNTER — Ambulatory Visit: Payer: Self-pay | Admitting: *Deleted

## 2021-02-21 NOTE — Telephone Encounter (Signed)
Left message for patient to give our office a call back at earliest convenience to advised of Dr.Johnson recommedations.

## 2021-02-21 NOTE — Telephone Encounter (Signed)
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 Patient states her worst symptom now is the fatigue. She does not have an appetite and is having slight nausea. Patient states she is hydrating and has medication on hand for nausea if needed. Patient encouraged to eat small meals and graze throughout the day.  Patient advised per COVID protocol and she is aware to reach out over the weekend if needed- virtual option/UC. Will send note to PCP for review and any addition patient instruction. Reason for Disposition . [1] COVID-19 diagnosed AND [2] has mild nausea, vomiting or diarrhea  Answer Assessment - Initial Assessment Questions 1. COVID-19 DIAGNOSIS: "Who made your COVID-19 diagnosis?" "Was it confirmed by a positive lab test or self-test?" If not diagnosed by a doctor (or NP/PA), ask "Are there lots of cases (community spread) where you live?" Note: See public health department website, if unsure.     Test at work- 5/25 2. COVID-19 EXPOSURE: "Was there any known exposure to COVID before the symptoms began?" CDC Definition of close contact: within 6 feet (2 meters) for a total of 15 minutes or more over a 24-hour period.      exposure 3. ONSET: "When did the COVID-19 symptoms start?"      Symptoms started Wednesday 4. WORST SYMPTOM: "What is your worst symptom?" (e.g., cough, fever, shortness of breath, muscle aches)     fatigue 5. COUGH: "Do you have a cough?" If Yes, ask: "How bad is the cough?"       Yes- patient has been experiencing URI- increased cough- not bad- has spells- productive, clear sputum 6. FEVER: "Do you have a fever?" If Yes, ask: "What is your temperature, how was it measured, and when did it start?"     No fever 7. RESPIRATORY  STATUS: "Describe your breathing?" (e.g., shortness of breath, wheezing, unable to speak)      No problems - except up/down steps 8. BETTER-SAME-WORSE: "Are you getting better, staying the same or getting worse compared to yesterday?"  If getting worse, ask, "In what way?"    same 9. HIGH RISK DISEASE: "Do you have any chronic medical problems?" (e.g., asthma, heart or lung disease, weak immune system, obesity, etc.)     Age only 10. VACCINE: "Have you had the COVID-19 vaccine?" If Yes, ask: "Which one, how many shots, when did you get it?"       Yes- Moderna 11. BOOSTER: "Have you received your COVID-19 booster?" If Yes, ask: "Which one and when did you get it?"       Yes- 12/21 12. PREGNANCY: "Is there any chance you are pregnant?" "When was your last menstrual period?"       n/a 13. OTHER SYMPTOMS: "Do you have any other symptoms?"  (e.g., chills, fatigue, headache, loss of smell or taste, muscle pain, sore throat)       Headache, no taste/smell, decreased appetite  14. O2 SATURATION MONITOR:  "Do you use an oxygen saturation monitor (pulse oximeter) at home?" If Yes, ask "What is your reading (oxygen level) today?" "What is your usual oxygen saturation reading?" (e.g., 95%)       Yes- 97%  Protocols used: CORONAVIRUS (COVID-19) DIAGNOSED OR SUSPECTED-A-AH

## 2021-02-21 NOTE — Telephone Encounter (Signed)
That is the best thing to do right now. If she is feeling pretty crummy, I'm happy to see her virtually to see if we can talk about other treatment options

## 2021-02-25 ENCOUNTER — Encounter: Payer: Self-pay | Admitting: Family Medicine

## 2021-02-25 ENCOUNTER — Telehealth: Payer: Self-pay

## 2021-02-25 ENCOUNTER — Ambulatory Visit (INDEPENDENT_AMBULATORY_CARE_PROVIDER_SITE_OTHER): Payer: Medicare Other | Admitting: Family Medicine

## 2021-02-25 VITALS — BP 132/76 | HR 88 | Temp 97.8°F

## 2021-02-25 DIAGNOSIS — U071 COVID-19: Secondary | ICD-10-CM

## 2021-02-25 MED ORDER — HYDROCOD POLST-CPM POLST ER 10-8 MG/5ML PO SUER: 5.0000 mL | Freq: Two times a day (BID) | ORAL | 0 refills | Status: DC | PRN

## 2021-02-25 MED ORDER — BENZONATATE 200 MG PO CAPS
200.0000 mg | ORAL_CAPSULE | Freq: Two times a day (BID) | ORAL | 0 refills | Status: DC | PRN
Start: 2021-02-25 — End: 2021-12-15

## 2021-02-25 MED ORDER — ALBUTEROL SULFATE HFA 108 (90 BASE) MCG/ACT IN AERS: 2.0000 | INHALATION_SPRAY | Freq: Four times a day (QID) | RESPIRATORY_TRACT | 3 refills | Status: DC | PRN

## 2021-02-25 NOTE — Telephone Encounter (Signed)
scheduled

## 2021-02-25 NOTE — Progress Notes (Signed)
BP 132/76   Pulse 88   Temp 97.8 F (36.6 C)   SpO2 97%    Subjective:    Patient ID: Annette Miller, female    DOB: 04-26-1951, 70 y.o.   MRN: [id number]  HPI: Annette Miller is a 70 y.o. female  Chief Complaint  Patient presents with  . Covid Positive    Patient states she tested positive for COVID last Wednesday. Patient is having cough, fatigue, smell and taste was gone but has come back a little bit, as well as headache.    UPPER RESPIRATORY TRACT INFECTION- tested positive 6 days ago with COVID Duration: 6 days Worst symptom: headache, cough Fever: no Cough: yes Shortness of breath: no Wheezing: no Chest pain: no Chest tightness: yes Chest congestion: no Nasal congestion: no Runny nose: no Post nasal drip: no Sneezing: yes Sore throat: no Swollen glands: no Sinus pressure: no Headache: yes Face pain: no Toothache: no Ear pain: no  Ear pressure: yes "right Eyes red/itching:no Eye drainage/crusting: yes  Vomiting: no Rash: no Fatigue: yes Sick contacts: yes Strep contacts: no  Context: better Recurrent sinusitis: no Relief with OTC cold/cough medications: no  Treatments attempted: mucinex   Relevant past medical, surgical, family and social history reviewed and updated as indicated. Interim medical history since our last visit reviewed. Allergies and medications reviewed and updated.  Review of Systems  Constitutional: Negative.   HENT: Positive for congestion, postnasal drip and rhinorrhea. Negative for dental problem, drooling, ear discharge, ear pain, facial swelling, hearing loss, mouth sores, nosebleeds, sinus pressure, sinus pain, sneezing, sore throat, tinnitus, trouble swallowing and voice change.   Respiratory: Positive for cough. Negative for apnea, choking, chest tightness, shortness of breath, wheezing and stridor.   Cardiovascular: Negative.   Gastrointestinal: Negative.   Musculoskeletal: Negative.   Neurological: Negative.    Psychiatric/Behavioral: Negative.     Per HPI unless specifically indicated above     Objective:    BP 132/76   Pulse 88   Temp 97.8 F (36.6 C)   SpO2 97%   Wt Readings from Last 3 Encounters:  02/11/21 95 lb 6.4 oz (43.3 kg)  12/24/20 97 lb (44 kg)  08/14/20 94 lb 3.2 oz (42.7 kg)    Physical Exam Vitals and nursing note reviewed.  Pulmonary:     Effort: Pulmonary effort is normal. No respiratory distress.     Comments: Speaking in full sentences Neurological:     Mental Status: She is alert.  Psychiatric:        Mood and Affect: Mood normal.        Behavior: Behavior normal.        Thought Content: Thought content normal.        Judgment: Judgment normal.     Results for orders placed or performed in visit on 12/24/20  Microscopic Examination   Urine  Result Value Ref Range   WBC, UA None seen 0 - 5 /hpf   RBC 0-2 0 - 2 /hpf   Epithelial Cells (non renal) 0-10 0 - 10 /hpf   Bacteria, UA None seen None seen/Few  CBC with Differential/Platelet  Result Value Ref Range   WBC 6.6 3.4 - 10.8 x10E3/uL   RBC 3.43 (L) 3.77 - 5.28 x10E6/uL   Hemoglobin 11.4 11.1 - 15.9 g/dL   Hematocrit 34.4 34.0 - 46.6 %   MCV 100 (H) 79 - 97 fL   MCH 33.2 (H) 26.6 - 33.0 pg   MCHC 33.1  31.5 - 35.7 g/dL   RDW 11.5 (L) 11.7 - 15.4 %   Platelets 298 150 - 450 x10E3/uL   Neutrophils 58 Not Estab. %   Lymphs 30 Not Estab. %   Monocytes 7 Not Estab. %   Eos 5 Not Estab. %   Basos 0 Not Estab. %   Neutrophils Absolute 3.8 1.4 - 7.0 x10E3/uL   Lymphocytes Absolute 2.0 0.7 - 3.1 x10E3/uL   Monocytes Absolute 0.5 0.1 - 0.9 x10E3/uL   EOS (ABSOLUTE) 0.3 0.0 - 0.4 x10E3/uL   Basophils Absolute 0.0 0.0 - 0.2 x10E3/uL   Immature Granulocytes 0 Not Estab. %   Immature Grans (Abs) 0.0 0.0 - 0.1 x10E3/uL  Comprehensive metabolic panel  Result Value Ref Range   Glucose 85 65 - 99 mg/dL   BUN 14 8 - 27 mg/dL   Creatinine, Ser 0.76 0.57 - 1.00 mg/dL   eGFR 85 >59 mL/min/1.73    BUN/Creatinine Ratio 18 12 - 28   Sodium 141 134 - 144 mmol/L   Potassium 4.3 3.5 - 5.2 mmol/L   Chloride 104 96 - 106 mmol/L   CO2 24 20 - 29 mmol/L   Calcium 8.8 8.7 - 10.3 mg/dL   Total Protein 6.5 6.0 - 8.5 g/dL   Albumin 4.1 3.8 - 4.8 g/dL   Globulin, Total 2.4 1.5 - 4.5 g/dL   Albumin/Globulin Ratio 1.7 1.2 - 2.2   Bilirubin Total <0.2 0.0 - 1.2 mg/dL   Alkaline Phosphatase 101 44 - 121 IU/L   AST 13 0 - 40 IU/L   ALT 6 0 - 32 IU/L  Lipid Panel w/o Chol/HDL Ratio  Result Value Ref Range   Cholesterol, Total 192 100 - 199 mg/dL   Triglycerides 128 0 - 149 mg/dL   HDL 52 >39 mg/dL   VLDL Cholesterol Cal 23 5 - 40 mg/dL   LDL Chol Calc (NIH) 117 (H) 0 - 99 mg/dL  Urinalysis, Routine w reflex microscopic  Result Value Ref Range   Specific Gravity, UA >1.030 (H) 1.005 - 1.030   pH, UA 6.0 5.0 - 7.5   Color, UA Yellow Yellow   Appearance Ur Clear Clear   Leukocytes,UA Negative Negative   Protein,UA Negative Negative/Trace   Glucose, UA Negative Negative   Ketones, UA Negative Negative   RBC, UA 2+ (A) Negative   Bilirubin, UA Negative Negative   Urobilinogen, Ur 0.2 0.2 - 1.0 mg/dL   Nitrite, UA Negative Negative   Microscopic Examination See below:   TSH  Result Value Ref Range   TSH 1.150 0.450 - 4.500 uIU/mL      Assessment & Plan:   Problem List Items Addressed This Visit   None   Visit Diagnoses    COVID-19    -  Primary   Will treat with albuterol, tessalon and tussionex. Call with any concerns. Continue to monitor- if not better, consider CXR. Call with any concerns.       Follow up plan: Return if symptoms worsen or fail to improve.    . This visit was completed via telephone due to the restrictions of the COVID-19 pandemic. All issues as above were discussed and addressed but no physical exam was performed. If it was felt that the patient should be evaluated in the office, they were directed there. The patient verbally consented to this visit. Patient  was unable to complete an audio/visual visit due to Lack of equipment. Due to the catastrophic nature of the COVID-19 pandemic, this  visit was done through audio contact only. . Location of the patient: home . Location of the provider: work . Those involved with this call:  . Provider: Park Liter, DO . CMA: Louanna Raw, Kennan . Front Desk/Registration: Jill Side  . Time spent on call: 22 minutes on the phone discussing health concerns. 30 minutes total spent in review of patient's record and preparation of their chart.

## 2021-02-25 NOTE — Telephone Encounter (Signed)
OK to book me at 1:20

## 2021-02-25 NOTE — Telephone Encounter (Signed)
Pt called back advised of Dr Henriette Combs previous message. Pt states that she stills has a headache and a terrible cough unable to go to work today. Pt is scheduled virtually tomorrow with Dr Laural Benes as to no availability today. Please advise if pt needs to be overbooked

## 2021-02-26 ENCOUNTER — Ambulatory Visit: Payer: Medicare Other | Admitting: Family Medicine

## 2021-03-24 ENCOUNTER — Other Ambulatory Visit: Payer: Self-pay | Admitting: Family Medicine

## 2021-03-24 DIAGNOSIS — M25572 Pain in left ankle and joints of left foot: Secondary | ICD-10-CM

## 2021-03-24 NOTE — Telephone Encounter (Signed)
Requested medication (s) are due for refill today: yes  Requested medication (s) are on the active medication list: yes  Last refill: 10/09/20 #30  1 refill  Future visit scheduled yes  06/26/21  Notes to clinic: Not delegated  Requested Prescriptions  Pending Prescriptions Disp Refills   cyclobenzaprine (FLEXERIL) 5 MG tablet [Pharmacy Med Name: CYCLOBENZAPRINE 5 MG TABLET] 30 tablet 1    Sig: TAKE 1/2 OF A TABLET BY MOUTH TWICE A DAY AS NEEDED FOR MUSCLE SPASMS      Not Delegated - Analgesics:  Muscle Relaxants Failed - 03/24/2021  5:17 PM      Failed - This refill cannot be delegated      Passed - Valid encounter within last 6 months    Recent Outpatient Visits           3 weeks ago COVID-19   [facility] [place], Megan P, DO   1 month ago Abscess   [facility] Vigg, Avanti, MD   1 month ago Viral upper respiratory tract infection   [facility] [place], Megan P, DO   1 month ago No-show for appointment   [facility], Megan P, DO   3 months ago Routine general medical examination at a health care facility   [facility], Oralia Rud, DO       Future Appointments             In 3 months Laural Benes, Oralia Rud, DO [facility]

## 2021-03-25 NOTE — Telephone Encounter (Signed)
Pt is scheduled 9/29

## 2021-04-19 DIAGNOSIS — Z0489 Encounter for examination and observation for other specified reasons: Secondary | ICD-10-CM | POA: Diagnosis not present

## 2021-06-19 ENCOUNTER — Other Ambulatory Visit: Payer: Self-pay | Admitting: Family Medicine

## 2021-06-26 ENCOUNTER — Ambulatory Visit: Payer: Medicare Other | Admitting: Family Medicine

## 2021-07-04 ENCOUNTER — Other Ambulatory Visit: Payer: Self-pay | Admitting: Family Medicine

## 2021-07-04 DIAGNOSIS — M25572 Pain in left ankle and joints of left foot: Secondary | ICD-10-CM

## 2021-07-04 NOTE — Telephone Encounter (Signed)
[facility] called and spoke to Annette Miller, [facility] about the refill(s) Wellbutrin requested. Advised it was sent on 06/19/21 #180/0 refill(s). He says it was received and was placed on hold. He says he is able to refill and it will be available later today for pickup.

## 2021-07-04 NOTE — Telephone Encounter (Signed)
Requested medication (s) are due for refill today: Yes  Requested medication (s) are on the active medication list: Yes  Last refill:  12/24/20 (Trazodone), 03/24/21 (Cyclobenzaprine)  Future visit scheduled: No  Notes to clinic:  Unable to refill per protocol, cannot delegate. Patient says she takes 2 trazodone nightly and would like a new Rx reflecting this change, route to provider for approval      Requested Prescriptions  Pending Prescriptions Disp Refills   cyclobenzaprine (FLEXERIL) 5 MG tablet 30 tablet 1     Not Delegated - Analgesics:  Muscle Relaxants Failed - 07/04/2021  1:10 PM      Failed - This refill cannot be delegated      Passed - Valid encounter within last 6 months    Recent Outpatient Visits           4 months ago COVID-19   [facility], Megan P, DO   4 months ago Abscess   [facility] Vigg, Avanti, MD   5 months ago Viral upper respiratory tract infection   [facility] [employer], Megan P, DO   5 months ago No-show for appointment   [facility], Megan P, DO   6 months ago Routine general medical examination at a health care facility   [facility], Megan P, DO               traZODone (DESYREL) 100 MG tablet 135 tablet 1    Sig: Take 1-1.5 tablets (100-150 mg total) by mouth at bedtime as needed. for sleep     Psychiatry: Antidepressants - Serotonin Modulator Passed - 07/04/2021  1:10 PM      Passed - Completed PHQ-2 or PHQ-9 in the last 360 days      Passed - Valid encounter within last 6 months    Recent Outpatient Visits           4 months ago COVID-19   [facility] [place], Megan P, DO   4 months ago Abscess   [facility] Vigg, Avanti, MD   5 months ago Viral upper respiratory tract infection   [facility] [place], Megan P, DO   5 months ago No-show for appointment   [facility], Megan P, DO    6 months ago Routine general medical examination at a health care facility   [facility], [place], DO

## 2021-07-04 NOTE — Telephone Encounter (Signed)
Medication refill: cyclobenzaprine (FLEXERIL) 5 MG tablet [[id number]]  traZODone (DESYREL) 100 MG tablet [[id number]]   Will nee to be called into : [id number] [place] [place] - [address] Phone:  [phone]  Fax:  [phone]    Pt states that she is taking two Trazadone a day and would like the directions corrected.   buPROPion (WELLBUTRIN SR) 150 MG 12 hr tablet [[id number]] Will need to be called into : [facility] [address] [place] - [address] [place] Phone:  [phone]  Fax:  [phone]     Pt states cheaper at [facility].

## 2021-07-08 MED ORDER — TRAZODONE HCL 100 MG PO TABS: 100.0000 mg | ORAL_TABLET | Freq: Every evening | ORAL | 1 refills | Status: DC | PRN

## 2021-07-08 MED ORDER — CYCLOBENZAPRINE HCL 5 MG PO TABS: ORAL_TABLET | ORAL | 1 refills | Status: DC

## 2021-07-10 ENCOUNTER — Telehealth: Payer: Self-pay | Admitting: Family Medicine

## 2021-07-10 NOTE — Telephone Encounter (Signed)
Copied from CRM [id number]. Topic: Medicare AWV >> Jul 10, 2021  2:58 PM Leigh Aurora wrote: Reason for CRM:  Left message for patient to call back and schedule the Medicare Annual Wellness Visit (AWV) virtually or by telephone.  Last AWV 04/03/19  Please schedule at anytime with [facility]-Nurse Health Advisor.  45 minute appointment  Any questions, please call me at [phone]

## 2021-09-25 ENCOUNTER — Telehealth: Payer: Self-pay | Admitting: Family Medicine

## 2021-09-25 NOTE — Telephone Encounter (Signed)
Copied from CRM [id number]. Topic: Medicare AWV >> Sep 25, 2021  3:33 PM Leigh Aurora wrote: Reason for CRM:  N/A unable to leave a message for patient to call back and schedule the Medicare Annual Wellness Visit (AWV) virtually or by telephone.  Last AWV  04/03/19  Please schedule at anytime with [facility]-Nurse Health Advisor.  45 minute appointment  Any questions, please call me at [phone]

## 2021-11-05 ENCOUNTER — Telehealth: Payer: Self-pay | Admitting: Family Medicine

## 2021-11-05 NOTE — Telephone Encounter (Signed)
Copied from CRM [id number]. Topic: Medicare AWV >> Nov 05, 2021 10:49 AM Leigh Aurora wrote: Reason for CRM: N/A unable to leave a  message for patient to call back and schedule the Medicare Annual Wellness Visit (AWV) virtually or by telephone.  Last AWV 04/03/19  Please schedule at anytime with [facility]-Nurse Health Advisor.  45 minute appointment  Any questions, please call me at [phone]

## 2021-11-13 ENCOUNTER — Telehealth: Payer: Self-pay

## 2021-11-13 NOTE — Telephone Encounter (Signed)
Patient left a voicemail and states she is having some abdominal issues and would like to make a appointment with Dr. Vicente Males. Tried to call patient back but unable to leave a message because memory is full

## 2021-11-21 ENCOUNTER — Other Ambulatory Visit: Payer: Self-pay | Admitting: Family Medicine

## 2021-11-21 DIAGNOSIS — M25572 Pain in left ankle and joints of left foot: Secondary | ICD-10-CM

## 2021-11-24 NOTE — Telephone Encounter (Signed)
Tried to call pt to schedule an appt as pt is overdue. Unable to leave vm

## 2021-11-24 NOTE — Telephone Encounter (Signed)
Requested medication (s) are due for refill today: Yes  Requested medication (s) are on the active medication list: Yes  Last refill:  07/08/21  Future visit scheduled: No  Notes to clinic:  Unable to leave message for pt. To make appointment.    Requested Prescriptions  Pending Prescriptions Disp Refills   traZODone (DESYREL) 100 MG tablet [Pharmacy Med Name: TRAZODONE 100 MG TABLET] 135 tablet 1    Sig: Take 1-1.5 tablets (100-150 mg total) by mouth at bedtime as needed. for sleep     Psychiatry: Antidepressants - Serotonin Modulator Failed - 11/21/2021  5:26 PM      Failed - Valid encounter within last 6 months    Recent Outpatient Visits           9 months ago [place], Megan P, DO   9 months ago Abscess   [facility] Vigg, Avanti, MD   9 months ago Viral upper respiratory tract infection   [place], Megan P, DO   9 months ago No-show for appointment   [facility], Megan P, DO   11 months ago Routine general medical examination at a health care facility   [facility], [place], DO              Passed - Completed PHQ-2 or PHQ-9 in the last 360 days       cyclobenzaprine (FLEXERIL) 5 MG tablet [Pharmacy Med Name: CYCLOBENZAPRINE 5 MG TABLET] 30 tablet 1    Sig: TAKE 1/2 OF A TABLET BY MOUTH TWICE A DAY AS NEEDED FOR MUSCLE SPASMS     Not Delegated - Analgesics:  Muscle Relaxants Failed - 11/21/2021  5:26 PM      Failed - This refill cannot be delegated      Failed - Valid encounter within last 6 months    Recent Outpatient Visits           9 months ago [place], Megan P, DO   9 months ago Abscess   [place], MD   9 months ago Viral upper respiratory tract infection   [place], [place], DO   9 months ago No-show for appointment   [facility], Megan P, DO   11 months ago Routine general medical examination at a health care facility   [facility], [place], DO

## 2021-11-25 NOTE — Telephone Encounter (Signed)
2nd attempt- Unable to leave vm to get pt scheduled.

## 2021-11-26 ENCOUNTER — Encounter: Payer: Self-pay | Admitting: Family Medicine

## 2021-11-26 NOTE — Telephone Encounter (Signed)
error 

## 2021-11-26 NOTE — Telephone Encounter (Signed)
3rd attempt- Unable to leave vm to schedule pt an appt due to patients mailbox being full. Letter has been sent to pt. ?

## 2021-12-15 ENCOUNTER — Encounter: Payer: Self-pay | Admitting: Family Medicine

## 2021-12-15 ENCOUNTER — Other Ambulatory Visit: Payer: Self-pay | Admitting: Family Medicine

## 2021-12-15 ENCOUNTER — Ambulatory Visit (INDEPENDENT_AMBULATORY_CARE_PROVIDER_SITE_OTHER): Payer: Medicare Other | Admitting: Family Medicine

## 2021-12-15 ENCOUNTER — Other Ambulatory Visit: Payer: Self-pay

## 2021-12-15 VITALS — BP 119/82 | HR 79 | Temp 97.8°F | Ht 61.5 in | Wt 105.0 lb

## 2021-12-15 DIAGNOSIS — M25572 Pain in left ankle and joints of left foot: Secondary | ICD-10-CM

## 2021-12-15 DIAGNOSIS — M79601 Pain in right arm: Secondary | ICD-10-CM | POA: Diagnosis not present

## 2021-12-15 DIAGNOSIS — E039 Hypothyroidism, unspecified: Secondary | ICD-10-CM | POA: Diagnosis not present

## 2021-12-15 DIAGNOSIS — E785 Hyperlipidemia, unspecified: Secondary | ICD-10-CM

## 2021-12-15 DIAGNOSIS — F3342 Major depressive disorder, recurrent, in full remission: Secondary | ICD-10-CM

## 2021-12-15 DIAGNOSIS — F5104 Psychophysiologic insomnia: Secondary | ICD-10-CM

## 2021-12-15 LAB — URINALYSIS, ROUTINE W REFLEX MICROSCOPIC
Bilirubin, UA: NEGATIVE
Glucose, UA: NEGATIVE
Ketones, UA: NEGATIVE
Leukocytes,UA: NEGATIVE
Nitrite, UA: NEGATIVE
Protein,UA: NEGATIVE
RBC, UA: NEGATIVE
Specific Gravity, UA: >1.03 — ABNORMAL HIGH (ref 1.005–1.030)
Urobilinogen, Ur: 0.2 mg/dL (ref 0.2–1.0)
pH, UA: 5.5 (ref 5.0–7.5)

## 2021-12-15 MED ORDER — TRAZODONE HCL 100 MG PO TABS
100.0000 mg | ORAL_TABLET | Freq: Every evening | ORAL | 1 refills | Status: DC | PRN
Start: 2021-12-15 — End: 2022-04-13

## 2021-12-15 MED ORDER — PANTOPRAZOLE SODIUM 40 MG PO TBEC: 40.0000 mg | DELAYED_RELEASE_TABLET | Freq: Two times a day (BID) | ORAL | 1 refills | Status: DC

## 2021-12-15 MED ORDER — GABAPENTIN 300 MG PO CAPS: ORAL_CAPSULE | ORAL | 1 refills | Status: DC

## 2021-12-15 MED ORDER — FEXOFENADINE HCL 180 MG PO TABS: 180.0000 mg | ORAL_TABLET | Freq: Every day | ORAL | 3 refills | Status: DC

## 2021-12-15 MED ORDER — ROSUVASTATIN CALCIUM 10 MG PO TABS: 10.0000 mg | ORAL_TABLET | Freq: Every day | ORAL | 1 refills | Status: DC

## 2021-12-15 MED ORDER — LEVOTHYROXINE SODIUM 25 MCG PO TABS: ORAL_TABLET | ORAL | 3 refills | Status: DC

## 2021-12-15 MED ORDER — ONDANSETRON HCL 4 MG PO TABS: ORAL_TABLET | ORAL | 1 refills | Status: DC

## 2021-12-15 MED ORDER — BUPROPION HCL ER (SR) 150 MG PO TB12: 150.0000 mg | ORAL_TABLET | Freq: Two times a day (BID) | ORAL | 1 refills | Status: DC

## 2021-12-15 MED ORDER — DIAZEPAM 5 MG PO TABS: 5.0000 mg | ORAL_TABLET | Freq: Every evening | ORAL | 0 refills | Status: DC | PRN

## 2021-12-15 MED ORDER — TRAZODONE HCL 100 MG PO TABS
100.0000 mg | ORAL_TABLET | Freq: Every evening | ORAL | 1 refills | Status: DC | PRN
Start: 2021-12-15 — End: 2021-12-15

## 2021-12-15 MED ORDER — CYCLOBENZAPRINE HCL 5 MG PO TABS: ORAL_TABLET | ORAL | 1 refills | Status: DC

## 2021-12-15 NOTE — Telephone Encounter (Signed)
[facility]. Medications pended to the correct pharmacy.  ?

## 2021-12-15 NOTE — Telephone Encounter (Signed)
Called and LVM letting patient know that medications have been in for to [facility] [place].  ?

## 2021-12-15 NOTE — Telephone Encounter (Signed)
Pt called in states is all out of traZODone (DESYREL) 100 MG tablet and the 7 other meds, she didn't know the name of. She is asking for temporay supply until these come in. ?[id number] [place] [place] - [address] Phone:  [phone]  ?Fax:  [phone]  ?  ? ?

## 2021-12-16 LAB — COMPREHENSIVE METABOLIC PANEL
ALT: 11 IU/L (ref 0–32)
AST: 14 IU/L (ref 0–40)
Albumin/Globulin Ratio: 1.3 (ref 1.2–2.2)
Albumin: 4.2 g/dL (ref 3.8–4.8)
Alkaline Phosphatase: 124 IU/L — ABNORMAL HIGH (ref 44–121)
BUN/Creatinine Ratio: 23 (ref 12–28)
BUN: 22 mg/dL (ref 8–27)
Bilirubin Total: <0.2 mg/dL (ref 0.0–1.2)
CO2: 24 mmol/L (ref 20–29)
Calcium: 9.3 mg/dL (ref 8.7–10.3)
Chloride: 101 mmol/L (ref 96–106)
Creatinine, Ser: 0.96 mg/dL (ref 0.57–1.00)
Globulin, Total: 3.2 g/dL (ref 1.5–4.5)
Glucose: 104 mg/dL — ABNORMAL HIGH (ref 70–99)
Potassium: 4.3 mmol/L (ref 3.5–5.2)
Sodium: 139 mmol/L (ref 134–144)
Total Protein: 7.4 g/dL (ref 6.0–8.5)
eGFR: 64 mL/min/{1.73_m2} (ref >59)

## 2021-12-16 LAB — CBC WITH DIFFERENTIAL/PLATELET
Basophils Absolute: 0 10*3/uL (ref 0.0–0.2)
Basos: 1 %
EOS (ABSOLUTE): 0.2 10*3/uL (ref 0.0–0.4)
Eos: 3 %
Hematocrit: 36.7 % (ref 34.0–46.6)
Hemoglobin: 12.1 g/dL (ref 11.1–15.9)
Immature Grans (Abs): 0 10*3/uL (ref 0.0–0.1)
Immature Granulocytes: 1 %
Lymphocytes Absolute: 1.6 10*3/uL (ref 0.7–3.1)
Lymphs: 19 %
MCH: 33 pg (ref 26.6–33.0)
MCHC: 33 g/dL (ref 31.5–35.7)
MCV: 100 fL — ABNORMAL HIGH (ref 79–97)
Monocytes Absolute: 0.4 10*3/uL (ref 0.1–0.9)
Monocytes: 5 %
Neutrophils Absolute: 5.9 10*3/uL (ref 1.4–7.0)
Neutrophils: 71 %
Platelets: 441 10*3/uL (ref 150–450)
RBC: 3.67 x10E6/uL — ABNORMAL LOW (ref 3.77–5.28)
RDW: 11.4 % — ABNORMAL LOW (ref 11.7–15.4)
WBC: 8.2 10*3/uL (ref 3.4–10.8)

## 2021-12-16 LAB — LIPID PANEL W/O CHOL/HDL RATIO
Cholesterol, Total: 215 mg/dL — ABNORMAL HIGH (ref 100–199)
HDL: 59 mg/dL (ref >39)
LDL Chol Calc (NIH): 132 mg/dL — ABNORMAL HIGH (ref 0–99)
Triglycerides: 137 mg/dL (ref 0–149)
VLDL Cholesterol Cal: 24 mg/dL (ref 5–40)

## 2021-12-16 LAB — TSH: TSH: 1.51 u[IU]/mL (ref 0.450–4.500)

## 2021-12-20 NOTE — Assessment & Plan Note (Signed)
Under good control on current regimen. Continue current regimen. Continue to monitor. Call with any concerns. Refills given. Labs drawn today.   

## 2021-12-20 NOTE — Assessment & Plan Note (Signed)
Under good control on current regimen. Continue current regimen. Continue to monitor. Call with any concerns. Refills given. Labs drawn today. Valium #30 given to last the year. ?

## 2021-12-20 NOTE — Assessment & Plan Note (Signed)
Rechecking labs today. Await results. Treat as needed.  °

## 2021-12-20 NOTE — Progress Notes (Signed)
? ?BP 119/82   Pulse 79   Temp 97.8 ?F (36.6 ?C) (Oral)   Ht 5' 1.5" (1.562 m)   Wt 105 lb (47.6 kg)   SpO2 98%   BMI 19.52 kg/m?   ? ?Subjective:  ? ? Patient ID: Annette Miller, female    DOB: 01/07/1951, 71 y.o.   MRN: [id number] ? ?HPI: ?Annette Miller is a 71 y.o. female ? ?Chief Complaint  ?Patient presents with  ? Arm Pain  ?  Right pain, patient states that arm and shoulder are broken   ? ?ARM PAIN ?Duration: months ?Location: upper arm ?Mechanism of injury:  broke it in 2019, but hasn't had any trauma since ?Onset: gradual ?Severity: severe  ?Quality:  aching and occasionally sharp ?Frequency: constant ?Radiation: no ?Aggravating factors: moving  ?Alleviating factors: nothing  ?Status: worse ?Treatments attempted: rest, ice, heat, and APAP, voltaren ?Relief with NSAIDs?:  No NSAIDs Taken ?Swelling: yes ?Redness: no  ?Warmth: no ?Trauma: no ?Chest pain: no  ?Shortness of breath: no  ?Fever: no ?Decreased sensation: no ?Paresthesias: no ?Weakness: no ? ?HYPERTENSION / HYPERLIPIDEMIA ?Satisfied with current treatment? yes ?Duration of hypertension: chronic ?BP monitoring frequency: not checking ?BP medication side effects: no ?Duration of hyperlipidemia: chronic ?Cholesterol medication side effects: no ?Cholesterol supplements: none ?Past cholesterol medications: crestor ?Medication compliance: excellent compliance ?Aspirin: no ?Recent stressors: no ?Recurrent headaches: no ?Visual changes: no ?Palpitations: no ?Dyspnea: no ?Chest pain: no ?Lower extremity edema: no ?Dizzy/lightheaded: no ? ?DEPRESSION ?Mood status: controlled ?Satisfied with current treatment?: yes ?Symptom severity: mild  ?Duration of current treatment : chronic ?Side effects: no ?Medication compliance: excellent compliance ?Psychotherapy/counseling: no  ?Previous psychiatric medications: wellbutrin ?Depressed mood: no ?Anxious mood: yes ?Anhedonia: no ?Significant weight loss or gain: no ?Insomnia: yes hard to fall asleep ?Fatigue:  yes ?Feelings of worthlessness or guilt: no ?Impaired concentration/indecisiveness: no ?Suicidal ideations: no ?Hopelessness: no ?Crying spells: no ? ?  12/15/2021  ?  8:25 AM 12/24/2020  ?  1:08 PM 07/16/2020  ? 10:48 AM 03/04/2020  ?  1:36 PM 11/24/2019  ? 11:52 AM  ?Depression screen PHQ 2/9  ?Decreased Interest 1 0 0 0 0  ?Down, Depressed, Hopeless 0 1 0 0 0  ?PHQ - 2 Score 1 1 0 0 0  ?Altered sleeping 3 3 0 3 3  ?Tired, decreased energy 2 3 0 0 2  ?Change in appetite 2 1 0 2 0  ?Feeling bad or failure about yourself  0 0 0 0 0  ?Trouble concentrating 0 0 0 0 0  ?Moving slowly or fidgety/restless 2 0 0 1 0  ?Suicidal thoughts 0 0 0 0 0  ?PHQ-9 Score 10 8 0 6 5  ?Difficult doing work/chores Somewhat difficult Not difficult at all Not difficult at all Not difficult at all Not difficult at all  ? ?HYPOTHYROIDISM ?Thyroid control status:controlled ?Satisfied with current treatment? no ?Medication side effects: no ?Medication compliance: excellent compliance ?Recent dose adjustment:no ?Fatigue: yes ?Cold intolerance: no ?Heat intolerance: no ?Weight gain: no ?Weight loss: no ?Constipation: no ?Diarrhea/loose stools: no ?Palpitations: no ?Lower extremity edema: no ?Anxiety/depressed mood: no ? ? ?Relevant past medical, surgical, family and social history reviewed and updated as indicated. Interim medical history since our last visit reviewed. ?Allergies and medications reviewed and updated. ? ?Review of Systems  ?Constitutional: Negative.   ?Respiratory: Negative.    ?Cardiovascular: Negative.   ?Musculoskeletal:  Positive for joint pain and myalgias. Negative for back pain, falls and neck pain.  ?  Neurological: Negative.   ?Psychiatric/Behavioral: Negative.    ? ? ?Per HPI unless specifically indicated above ? ?   ?Objective:  ?  ?BP 119/82   Pulse 79   Temp 97.8 ?F (36.6 ?C) (Oral)   Ht 5' 1.5" (1.562 m)   Wt 105 lb (47.6 kg)   SpO2 98%   BMI 19.52 kg/m?   ?Wt Readings from Last 3 Encounters:  ?12/15/21 105 lb (47.6  kg)  ?02/11/21 95 lb 6.4 oz (43.3 kg)  ?12/24/20 97 lb (44 kg)  ?  ?Physical Exam ?Vitals and nursing note reviewed.  ?Constitutional:   ?   General: She is not in acute distress. ?   Appearance: Normal appearance. She is not ill-appearing, toxic-appearing or diaphoretic.  ?HENT:  ?   Head: Normocephalic and atraumatic.  ?   Right Ear: External ear normal.  ?   Left Ear: External ear normal.  ?   Nose: Nose normal.  ?   Mouth/Throat:  ?   Mouth: Mucous membranes are moist.  ?   Pharynx: Oropharynx is clear.  ?Eyes:  ?   General: No scleral icterus.    ?   Right eye: No discharge.     ?   Left eye: No discharge.  ?   Extraocular Movements: Extraocular movements intact.  ?   Conjunctiva/sclera: Conjunctivae normal.  ?   Pupils: Pupils are equal, round, and reactive to light.  ?Cardiovascular:  ?   Rate and Rhythm: Normal rate and regular rhythm.  ?   Pulses: Normal pulses.  ?   Heart sounds: Normal heart sounds. No murmur heard. ?  No friction rub. No gallop.  ?Pulmonary:  ?   Effort: Pulmonary effort is normal. No respiratory distress.  ?   Breath sounds: Normal breath sounds. No stridor. No wheezing, rhonchi or rales.  ?Chest:  ?   Chest wall: No tenderness.  ?Musculoskeletal:     ?   General: Normal range of motion.  ?   Cervical back: Normal range of motion and neck supple.  ?Skin: ?   General: Skin is warm and dry.  ?   Capillary Refill: Capillary refill takes less than 2 seconds.  ?   Coloration: Skin is not jaundiced or pale.  ?   Findings: No bruising, erythema, lesion or rash.  ?Neurological:  ?   General: No focal deficit present.  ?   Mental Status: She is alert and oriented to person, place, and time. Mental status is at baseline.  ?Psychiatric:     ?   Mood and Affect: Mood normal.     ?   Behavior: Behavior normal.     ?   Thought Content: Thought content normal.     ?   Judgment: Judgment normal.  ? ? ?Results for orders placed or performed in visit on 12/15/21  ?CBC with Differential/Platelet  ?Result  Value Ref Range  ? WBC 8.2 3.4 - 10.8 x10E3/uL  ? RBC 3.67 (L) 3.77 - 5.28 x10E6/uL  ? Hemoglobin 12.1 11.1 - 15.9 g/dL  ? Hematocrit 36.7 34.0 - 46.6 %  ? MCV 100 (H) 79 - 97 fL  ? MCH 33.0 26.6 - 33.0 pg  ? MCHC 33.0 31.5 - 35.7 g/dL  ? RDW 11.4 (L) 11.7 - 15.4 %  ? Platelets 441 150 - 450 x10E3/uL  ? Neutrophils 71 Not Estab. %  ? Lymphs 19 Not Estab. %  ? Monocytes 5 Not Estab. %  ? Eos 3 Not  Estab. %  ? Basos 1 Not Estab. %  ? Neutrophils Absolute 5.9 1.4 - 7.0 x10E3/uL  ? Lymphocytes Absolute 1.6 0.7 - 3.1 x10E3/uL  ? Monocytes Absolute 0.4 0.1 - 0.9 x10E3/uL  ? EOS (ABSOLUTE) 0.2 0.0 - 0.4 x10E3/uL  ? Basophils Absolute 0.0 0.0 - 0.2 x10E3/uL  ? Immature Granulocytes 1 Not Estab. %  ? Immature Grans (Abs) 0.0 0.0 - 0.1 x10E3/uL  ?Comprehensive metabolic panel  ?Result Value Ref Range  ? Glucose 104 (H) 70 - 99 mg/dL  ? BUN 22 8 - 27 mg/dL  ? Creatinine, Ser 0.96 0.57 - 1.00 mg/dL  ? eGFR 64 >59 mL/min/1.73  ? BUN/Creatinine Ratio 23 12 - 28  ? Sodium 139 134 - 144 mmol/L  ? Potassium 4.3 3.5 - 5.2 mmol/L  ? Chloride 101 96 - 106 mmol/L  ? CO2 24 20 - 29 mmol/L  ? Calcium 9.3 8.7 - 10.3 mg/dL  ? Total Protein 7.4 6.0 - 8.5 g/dL  ? Albumin 4.2 3.8 - 4.8 g/dL  ? Globulin, Total 3.2 1.5 - 4.5 g/dL  ? Albumin/Globulin Ratio 1.3 1.2 - 2.2  ? Bilirubin Total <0.2 0.0 - 1.2 mg/dL  ? Alkaline Phosphatase 124 (H) 44 - 121 IU/L  ? AST 14 0 - 40 IU/L  ? ALT 11 0 - 32 IU/L  ?Lipid Panel w/o Chol/HDL Ratio  ?Result Value Ref Range  ? Cholesterol, Total 215 (H) 100 - 199 mg/dL  ? Triglycerides 137 0 - 149 mg/dL  ? HDL 59 >39 mg/dL  ? VLDL Cholesterol Cal 24 5 - 40 mg/dL  ? LDL Chol Calc (NIH) 132 (H) 0 - 99 mg/dL  ?TSH  ?Result Value Ref Range  ? TSH 1.510 0.450 - 4.500 uIU/mL  ?Urinalysis, Routine w reflex microscopic  ?Result Value Ref Range  ? Specific Gravity, UA >1.030 (H) 1.005 - 1.030  ? pH, UA 5.5 5.0 - 7.5  ? Color, UA Yellow Yellow  ? Appearance Ur Clear Clear  ? Leukocytes,UA Negative Negative  ? Protein,UA  Negative Negative/Trace  ? Glucose, UA Negative Negative  ? Ketones, UA Negative Negative  ? RBC, UA Negative Negative  ? Bilirubin, UA Negative Negative  ? Urobilinogen, Ur 0.2 0.2 - 1.0 mg/dL  ? Nitrite, UA Negative

## 2021-12-29 ENCOUNTER — Ambulatory Visit: Payer: Self-pay | Admitting: *Deleted

## 2021-12-29 ENCOUNTER — Telehealth: Payer: Self-pay | Admitting: Family Medicine

## 2021-12-29 ENCOUNTER — Ambulatory Visit: Payer: Self-pay

## 2021-12-29 DIAGNOSIS — M79601 Pain in right arm: Secondary | ICD-10-CM

## 2021-12-29 NOTE — Telephone Encounter (Signed)
?  Chief Complaint: arm pain ?Symptoms: R shoulder to elbow pain, swelling ?Frequency: years but gotten worse ?Pertinent Negatives: NA ?Disposition: [] ED /[] Urgent Care (no appt availability in office) / [] Appointment(In office/virtual)/ []  [facility]/ [] Home Care/ [] Refused Recommended Disposition /[] [facility] [facility]/ [x]  Follow-up with PCP ?Additional Notes: Pt had OV on 12/15/21. Pt states that flexeril isn't helping at all with pain. She states this isn't muscle pain but like bone pain. Pt didn't want to come back in and states she is going to [place] today but is available on her cell #. Pt is requesting something to be sent in for pain and is also needing [facility] and she says its too expensive.   ? ?Reason for Disposition ? [1] MODERATE pain (e.g., interferes with normal activities) AND [2] present > 3 days ? ?Answer Assessment - Initial Assessment Questions ?1. ONSET: "When did the pain start?" ?    2019 but gotten worse ?2. LOCATION: "Where is the pain located?" ?    R shoulder to elbow  ?3. PAIN: "How bad is the pain?" (Scale 1-10; or mild, moderate, severe) ?  - MILD (1-3): doesn't interfere with normal activities. ?  - MODERATE (4-7): interferes with normal activities (e.g., work or school) or awakens from sleep. ?  - SEVERE (8-10): excruciating pain, unable to do any normal activities, unable to use arm at all. ?    8 ?6. OTHER SYMPTOMS: "Do you have any other symptoms?" (e.g., neck pain, elbow swelling, rash, fever) ?    Slight swelling ? ?Protocols used: Elbow Pain-A-AH ? ?

## 2021-12-29 NOTE — Telephone Encounter (Signed)
Routing to provider to advise. What would the next steps be for the patient? ?

## 2021-12-29 NOTE — Telephone Encounter (Signed)
Referral to ortho placed. [facility] if she can't wait until their appointment.  ?

## 2021-12-29 NOTE — Telephone Encounter (Signed)
Medication Refill - Medication:ondansetron (ZOFRAN) 4 MG tablet ? ?Has the patient contacted their pharmacy? yes ?(Agent: If no, request that the patient contact the pharmacy for the refill. If patient does not wish to contact the pharmacy document the reason why and proceed with request.) ?(Agent: If yes, when and what did the pharmacy advise?)contact pcp ? ?Preferred Pharmacy (with phone number or street name): ?[id number] [place], [place] - [address] Phone:  [phone]  ?Fax:  [phone]  ?  ? ?Has the patient been seen for an appointment in the last year OR does the patient have an upcoming appointment? yes ? ?Agent: Please be advised that RX refills may take up to 3 business days. We ask that you follow-up with your pharmacy.  ?

## 2021-12-29 NOTE — Telephone Encounter (Signed)
Patient called, left VM to return the call to the office to discuss symptoms with a nurse. ? ? ?Summary: ongoing pain in elbow  ? Pt called in has been having ongoing off and on pain in elbow since 2019. She is asking for pain medicine, like flexerol to be sent to pharmacy. [id number] [place], [place] - [address]  ?Phone: [phone]  ?Fax: [phone]   ?  ? ?

## 2021-12-29 NOTE — Telephone Encounter (Signed)
Pt returned call, message from Dr. Laural Benes read to pt, verbalizes understanding. [facility] [place]. Will route to [facility]. ? ?Reason for Disposition ? [1] Caller requesting NON-URGENT health information AND [2] PCP's office is the best resource ? ?Answer Assessment - Initial Assessment Questions ?1. REASON FOR CALL or QUESTION: "What is your reason for calling today?" or "How can I best help you?" or "What question do you have that I can help answer?" ?    Message from Dr. Laural Benes ? ?Protocols used: Information Only Call - No Triage-A-AH ? ?

## 2021-12-29 NOTE — Addendum Note (Signed)
Addended by: Dorcas Carrow on: 12/29/2021 02:06 PM ? ? Modules accepted: Orders ? ?

## 2021-12-30 ENCOUNTER — Other Ambulatory Visit: Payer: Self-pay

## 2021-12-30 MED ORDER — ONDANSETRON HCL 4 MG PO TABS
ORAL_TABLET | ORAL | 1 refills | Status: DC
Start: 2021-12-30 — End: 2022-03-11

## 2021-12-30 NOTE — Telephone Encounter (Signed)
Sent to PCP via medication refill. Medication was sent to incorrect pharmacy on 12/15/21. ?

## 2021-12-30 NOTE — Telephone Encounter (Signed)
Medication filled 12/30/21 by provider. ?

## 2021-12-30 NOTE — Telephone Encounter (Signed)
Patient requesting refill, was sent to wrong pharmacy. [facility] [place]  ?

## 2022-02-06 ENCOUNTER — Ambulatory Visit: Payer: Self-pay

## 2022-02-06 NOTE — Telephone Encounter (Signed)
? ? ? ?  Chief Complaint: [employer] Monday, needs to be cleared for work. ?Symptoms: Right eye bruising ?Frequency: Happened Monday 02/02/22. ?Pertinent Negatives: Patient denies any pain. ?Disposition: [] ED /[] Urgent Care (no appt availability in office) / [x] Appointment(In office/virtual)/ []  [facility]/ [] Home Care/ [] Refused Recommended Disposition /[] [facility]/ []  Follow-up with PCP ?Additional Notes:   ?Reason for Disposition ? MILD weakness (i.e., does not interfere with ability to work, go to school, normal activities) (Exception: mild weakness is a chronic symptom) ? ?Answer Assessment - Initial Assessment Questions ?1. MECHANISM: "How did the fall happen?" ?    Slipped on floor ?2. DOMESTIC VIOLENCE AND ELDER ABUSE SCREENING: "Did you fall because someone pushed you or tried to hurt you?" If Yes, ask: "Are you safe now?" ?    No ?3. ONSET: "When did the fall happen?" (e.g., minutes, hours, or days ago) ?    Monday ?4. LOCATION: "What part of the body hit the ground?" (e.g., back, buttocks, head, hips, knees, hands, head, stomach) ?    On face ?5. INJURY: "Did you hurt (injure) yourself when you fell?" If Yes, ask: "What did you injure? Tell me more about this?" (e.g., body area; type of injury; pain severity)" ?    Lump on eyebrow, right ?6. PAIN: "Is there any pain?" If Yes, ask: "How bad is the pain?" (e.g., Scale 1-10; or mild,  ?moderate, severe) ?  - NONE (0): No pain ?  - MILD (1-3): Doesn't interfere with normal activities  ?  - MODERATE (4-7): Interferes with normal activities or awakens from sleep  ?  - SEVERE (8-10): Excruciating pain, unable to do any normal activities  ?    Mild ?7. SIZE: For cuts, bruises, or swelling, ask: "How large is it?" (e.g., inches or centimeters)  ?    No ?8. PREGNANCY: "Is there any chance you are pregnant?" "When was your last menstrual period?" ?    No ?9. OTHER SYMPTOMS: "Do you have any other symptoms?" (e.g., dizziness, fever, weakness; new  onset or worsening).  ?    Slipped ?10. CAUSE: "What do you think caused the fall (or falling)?" (e.g., tripped, dizzy spell) ?      Slipped. ? ?Protocols used: Falls and Falling-A-AH ? ?

## 2022-02-09 ENCOUNTER — Ambulatory Visit
Admission: RE | Admit: 2022-02-09 | Discharge: 2022-02-09 | Disposition: A | Discharge status: Home / Self Care | Payer: Medicare Other | Source: Ambulatory Visit | Attending: Family Medicine | Admitting: Family Medicine

## 2022-02-09 ENCOUNTER — Encounter: Payer: Self-pay | Admitting: Family Medicine

## 2022-02-09 ENCOUNTER — Ambulatory Visit (INDEPENDENT_AMBULATORY_CARE_PROVIDER_SITE_OTHER): Payer: Medicare Other | Admitting: Family Medicine

## 2022-02-09 VITALS — BP 100/64 | HR 76 | Temp 98.0°F | Wt 106.4 lb

## 2022-02-09 DIAGNOSIS — J342 Deviated nasal septum: Secondary | ICD-10-CM | POA: Insufficient documentation

## 2022-02-09 DIAGNOSIS — Y939 Activity, unspecified: Secondary | ICD-10-CM | POA: Insufficient documentation

## 2022-02-09 DIAGNOSIS — W19XXXA Unspecified fall, initial encounter: Secondary | ICD-10-CM

## 2022-02-09 DIAGNOSIS — S0993XA Unspecified injury of face, initial encounter: Secondary | ICD-10-CM

## 2022-02-09 DIAGNOSIS — S0083XA Contusion of other part of head, initial encounter: Secondary | ICD-10-CM | POA: Insufficient documentation

## 2022-02-09 DIAGNOSIS — R519 Headache, unspecified: Secondary | ICD-10-CM | POA: Diagnosis not present

## 2022-02-09 DIAGNOSIS — S060XAA Concussion with loss of consciousness status unknown, initial encounter: Secondary | ICD-10-CM | POA: Diagnosis not present

## 2022-02-09 DIAGNOSIS — S0011XA Contusion of right eyelid and periocular area, initial encounter: Secondary | ICD-10-CM | POA: Diagnosis not present

## 2022-02-09 DIAGNOSIS — R220 Localized swelling, mass and lump, head: Secondary | ICD-10-CM | POA: Diagnosis not present

## 2022-02-09 NOTE — Progress Notes (Signed)
? ?BP 100/64   Pulse 76   Temp 98 ?F (36.7 ?C)   Wt 106 lb 6.4 oz (48.3 kg)   SpO2 98%   BMI 19.78 kg/m?   ? ?Subjective:  ? ? Patient ID: Annette Miller, female    DOB: 1950-11-20, 71 y.o.   MRN: [id number] ? ?HPI: ?Annette Miller is a 71 y.o. female ? ?Chief Complaint  ?Patient presents with  ? Fall  ?  Patient states she fell last Monday and hit her face, needs form filled out to be cleared to go back to work.   ? ?Slipped and fell on the floor on 02/02/22 when she was in stocking feet on a slippery bathroom floor. She fell and hit her face on the corner of the bathroom sink. She is not sure if she passed out. She thinks that she remembers everything, but was very groggy. She has been having headaches. No current changes in her vision, but it was off earlier this week. + dizziness. Dizziness is getting worse. She did not go see anyone when this happened. She has been having significant pain and bruising to her R eyebrow. Still very tender to touch.  ? ?Relevant past medical, surgical, family and social history reviewed and updated as indicated. Interim medical history since our last visit reviewed. ?Allergies and medications reviewed and updated. ? ?Review of Systems  ?Constitutional: Negative.   ?HENT: Negative.    ?Respiratory: Negative.    ?Cardiovascular: Negative.   ?Gastrointestinal: Negative.   ?Musculoskeletal:  Positive for myalgias. Negative for arthralgias, back pain, gait problem, joint swelling, neck pain and neck stiffness.  ?Skin:  Positive for color change. Negative for pallor, rash and wound.  ?Neurological:  Positive for dizziness, facial asymmetry and headaches. Negative for tremors, seizures, syncope, speech difficulty, weakness, light-headedness and numbness.  ?Psychiatric/Behavioral: Negative.    ? ?Per HPI unless specifically indicated above ? ?   ?Objective:  ?  ?BP 100/64   Pulse 76   Temp 98 ?F (36.7 ?C)   Wt 106 lb 6.4 oz (48.3 kg)   SpO2 98%   BMI 19.78 kg/m?   ?Wt Readings  from Last 3 Encounters:  ?02/09/22 106 lb 6.4 oz (48.3 kg)  ?12/15/21 105 lb (47.6 kg)  ?02/11/21 95 lb 6.4 oz (43.3 kg)  ?  ?Physical Exam ?Vitals and nursing note reviewed.  ?Constitutional:   ?   General: She is not in acute distress. ?   Appearance: Normal appearance. She is normal weight. She is not ill-appearing, toxic-appearing or diaphoretic.  ?HENT:  ?   Head: Normocephalic and atraumatic.  ?   Right Ear: External ear normal.  ?   Left Ear: External ear normal.  ?   Nose: Nose normal.  ?   Mouth/Throat:  ?   Mouth: Mucous membranes are moist.  ?   Pharynx: Oropharynx is clear.  ?Eyes:  ?   General: No scleral icterus.    ?   Right eye: No discharge.     ?   Left eye: No discharge.  ?   Extraocular Movements: Extraocular movements intact.  ?   Conjunctiva/sclera: Conjunctivae normal.  ?   Pupils: Pupils are equal, round, and reactive to light.  ?Cardiovascular:  ?   Rate and Rhythm: Normal rate and regular rhythm.  ?   Pulses: Normal pulses.  ?   Heart sounds: Normal heart sounds. No murmur heard. ?  No friction rub. No gallop.  ?Pulmonary:  ?  Effort: Pulmonary effort is normal. No respiratory distress.  ?   Breath sounds: Normal breath sounds. No stridor. No wheezing, rhonchi or rales.  ?Chest:  ?   Chest wall: No tenderness.  ?Musculoskeletal:     ?   General: Swelling, tenderness and deformity present.  ?   Cervical back: Normal range of motion and neck supple.  ?   Comments: Significant tenderness, swelling and bruising to R superior orbit. Concern for fracture.   ?Skin: ?   General: Skin is warm and dry.  ?   Capillary Refill: Capillary refill takes less than 2 seconds.  ?   Coloration: Skin is not jaundiced or pale.  ?   Findings: No bruising, erythema, lesion or rash.  ?Neurological:  ?   General: No focal deficit present.  ?   Mental Status: She is alert and oriented to person, place, and time. Mental status is at baseline.  ?Psychiatric:     ?   Mood and Affect: Mood normal.     ?   Behavior:  Behavior normal.     ?   Thought Content: Thought content normal.     ?   Judgment: Judgment normal.  ? ? ?Results for orders placed or performed in visit on 12/15/21  ?CBC with Differential/Platelet  ?Result Value Ref Range  ? WBC 8.2 3.4 - 10.8 x10E3/uL  ? RBC 3.67 (L) 3.77 - 5.28 x10E6/uL  ? Hemoglobin 12.1 11.1 - 15.9 g/dL  ? Hematocrit 36.7 34.0 - 46.6 %  ? MCV 100 (H) 79 - 97 fL  ? MCH 33.0 26.6 - 33.0 pg  ? MCHC 33.0 31.5 - 35.7 g/dL  ? RDW 11.4 (L) 11.7 - 15.4 %  ? Platelets 441 150 - 450 x10E3/uL  ? Neutrophils 71 Not Estab. %  ? Lymphs 19 Not Estab. %  ? Monocytes 5 Not Estab. %  ? Eos 3 Not Estab. %  ? Basos 1 Not Estab. %  ? Neutrophils Absolute 5.9 1.4 - 7.0 x10E3/uL  ? Lymphocytes Absolute 1.6 0.7 - 3.1 x10E3/uL  ? Monocytes Absolute 0.4 0.1 - 0.9 x10E3/uL  ? EOS (ABSOLUTE) 0.2 0.0 - 0.4 x10E3/uL  ? Basophils Absolute 0.0 0.0 - 0.2 x10E3/uL  ? Immature Granulocytes 1 Not Estab. %  ? Immature Grans (Abs) 0.0 0.0 - 0.1 x10E3/uL  ?Comprehensive metabolic panel  ?Result Value Ref Range  ? Glucose 104 (H) 70 - 99 mg/dL  ? BUN 22 8 - 27 mg/dL  ? Creatinine, Ser 0.96 0.57 - 1.00 mg/dL  ? eGFR 64 >59 mL/min/1.73  ? BUN/Creatinine Ratio 23 12 - 28  ? Sodium 139 134 - 144 mmol/L  ? Potassium 4.3 3.5 - 5.2 mmol/L  ? Chloride 101 96 - 106 mmol/L  ? CO2 24 20 - 29 mmol/L  ? Calcium 9.3 8.7 - 10.3 mg/dL  ? Total Protein 7.4 6.0 - 8.5 g/dL  ? Albumin 4.2 3.8 - 4.8 g/dL  ? Globulin, Total 3.2 1.5 - 4.5 g/dL  ? Albumin/Globulin Ratio 1.3 1.2 - 2.2  ? Bilirubin Total <0.2 0.0 - 1.2 mg/dL  ? Alkaline Phosphatase 124 (H) 44 - 121 IU/L  ? AST 14 0 - 40 IU/L  ? ALT 11 0 - 32 IU/L  ?Lipid Panel w/o Chol/HDL Ratio  ?Result Value Ref Range  ? Cholesterol, Total 215 (H) 100 - 199 mg/dL  ? Triglycerides 137 0 - 149 mg/dL  ? HDL 59 >39 mg/dL  ? VLDL Cholesterol Cal 24 5 -  40 mg/dL  ? LDL Chol Calc (NIH) 132 (H) 0 - 99 mg/dL  ?TSH  ?Result Value Ref Range  ? TSH 1.510 0.450 - 4.500 uIU/mL  ?Urinalysis, Routine w reflex microscopic   ?Result Value Ref Range  ? Specific Gravity, UA >1.030 (H) 1.005 - 1.030  ? pH, UA 5.5 5.0 - 7.5  ? Color, UA Yellow Yellow  ? Appearance Ur Clear Clear  ? Leukocytes,UA Negative Negative  ? Protein,UA Negative Negative/Trace  ? Glucose, UA Negative Negative  ? Ketones, UA Negative Negative  ? RBC, UA Negative Negative  ? Bilirubin, UA Negative Negative  ? Urobilinogen, Ur 0.2 0.2 - 1.0 mg/dL  ? Nitrite, UA Negative Negative  ? ?   ?Assessment & Plan:  ? ?Problem List Items Addressed This Visit   ?None ?Visit Diagnoses   ? ? Concussion with unknown loss of consciousness status, initial encounter    -  Primary  ? Brain rest for at least the next week. Will keep her out of work. Will check CT head and orbits to r/o fracture and slow bleed. Await results.   ? Fall, initial encounter      ? Will keep her out of work. Will check CT head and orbits to r/o fracture and slow bleed. Await results.   ? Relevant Orders  ? CT HEAD WO CONTRAST (5MM)  ? CT ORBITS WO CONTRAST  ? Facial injury, initial encounter      ? Will keep her out of work. Will check CT head and orbits to r/o fracture and slow bleed. Await results.   ? Relevant Orders  ? CT HEAD WO CONTRAST (5MM)  ? CT ORBITS WO CONTRAST  ? ?  ?  ? ?Follow up plan: ?Return in about 1 week (around 02/16/2022). ? ? ? ? ? ?

## 2022-02-16 ENCOUNTER — Encounter: Payer: Self-pay | Admitting: Family Medicine

## 2022-02-16 ENCOUNTER — Ambulatory Visit (INDEPENDENT_AMBULATORY_CARE_PROVIDER_SITE_OTHER): Payer: Medicare Other | Admitting: Family Medicine

## 2022-02-16 VITALS — BP 98/64 | HR 114 | Temp 99.0°F | Wt 101.8 lb

## 2022-02-16 DIAGNOSIS — S060XAD Concussion with loss of consciousness status unknown, subsequent encounter: Secondary | ICD-10-CM | POA: Diagnosis not present

## 2022-02-16 NOTE — Patient Instructions (Signed)
Address: [address] [place], [place], [place] [address] Phone: [phone]

## 2022-02-16 NOTE — Progress Notes (Signed)
BP 98/64   Pulse (!) 114   Temp 99 F (37.2 C)   Wt 101 lb 12.8 oz (46.2 kg)   BMI 18.93 kg/m    Subjective:    Patient ID: Annette Miller, female    DOB: 06-02-1951, 71 y.o.   MRN: [id number]  HPI: Annette Miller is a 71 y.o. female  Chief Complaint  Patient presents with   Fall    Patient is here to follow up from last week, states she feels okay today.    Feeling much better. No more headaches. 1 episode of dizziness a couple of days ago, but none since. Has not been having any changes in her vision. Bruising is resolving. No facial pain. Feels like she is OK to return to work.   Relevant past medical, surgical, family and social history reviewed and updated as indicated. Interim medical history since our last visit reviewed. Allergies and medications reviewed and updated.  Review of Systems  Constitutional: Negative.   Respiratory: Negative.    Cardiovascular: Negative.   Gastrointestinal: Negative.   Musculoskeletal: Negative.   Psychiatric/Behavioral: Negative.     Per HPI unless specifically indicated above     Objective:    BP 98/64   Pulse (!) 114   Temp 99 F (37.2 C)   Wt 101 lb 12.8 oz (46.2 kg)   BMI 18.93 kg/m   Wt Readings from Last 3 Encounters:  02/16/22 101 lb 12.8 oz (46.2 kg)  02/09/22 106 lb 6.4 oz (48.3 kg)  12/15/21 105 lb (47.6 kg)    Physical Exam Vitals and nursing note reviewed.  Constitutional:      General: She is not in acute distress.    Appearance: Normal appearance. She is not ill-appearing, toxic-appearing or diaphoretic.  HENT:     Head: Normocephalic and atraumatic.     Right Ear: External ear normal.     Left Ear: External ear normal.     Nose: Nose normal.     Mouth/Throat:     Mouth: Mucous membranes are moist.     Pharynx: Oropharynx is clear.  Eyes:     General: No scleral icterus.       Right eye: No discharge.        Left eye: No discharge.     Extraocular Movements: Extraocular movements intact.      Conjunctiva/sclera: Conjunctivae normal.     Pupils: Pupils are equal, round, and reactive to light.  Cardiovascular:     Rate and Rhythm: Normal rate and regular rhythm.     Pulses: Normal pulses.     Heart sounds: Normal heart sounds. No murmur heard.   No friction rub. No gallop.  Pulmonary:     Effort: Pulmonary effort is normal. No respiratory distress.     Breath sounds: Normal breath sounds. No stridor. No wheezing, rhonchi or rales.  Chest:     Chest wall: No tenderness.  Musculoskeletal:        General: Normal range of motion.     Cervical back: Normal range of motion and neck supple.  Skin:    General: Skin is warm and dry.     Capillary Refill: Capillary refill takes less than 2 seconds.     Coloration: Skin is not jaundiced or pale.     Findings: Bruising (on R cheek) present. No erythema, lesion or rash.  Neurological:     General: No focal deficit present.     Mental Status: She is alert  and oriented to person, place, and time. Mental status is at baseline.  Psychiatric:        Mood and Affect: Mood normal.        Behavior: Behavior normal.        Thought Content: Thought content normal.        Judgment: Judgment normal.    Results for orders placed or performed in visit on 12/15/21  CBC with Differential/Platelet  Result Value Ref Range   WBC 8.2 3.4 - 10.8 x10E3/uL   RBC 3.67 (L) 3.77 - 5.28 x10E6/uL   Hemoglobin 12.1 11.1 - 15.9 g/dL   Hematocrit 36.7 34.0 - 46.6 %   MCV 100 (H) 79 - 97 fL   MCH 33.0 26.6 - 33.0 pg   MCHC 33.0 31.5 - 35.7 g/dL   RDW 11.4 (L) 11.7 - 15.4 %   Platelets 441 150 - 450 x10E3/uL   Neutrophils 71 Not Estab. %   Lymphs 19 Not Estab. %   Monocytes 5 Not Estab. %   Eos 3 Not Estab. %   Basos 1 Not Estab. %   Neutrophils Absolute 5.9 1.4 - 7.0 x10E3/uL   Lymphocytes Absolute 1.6 0.7 - 3.1 x10E3/uL   Monocytes Absolute 0.4 0.1 - 0.9 x10E3/uL   EOS (ABSOLUTE) 0.2 0.0 - 0.4 x10E3/uL   Basophils Absolute 0.0 0.0 - 0.2 x10E3/uL    Immature Granulocytes 1 Not Estab. %   Immature Grans (Abs) 0.0 0.0 - 0.1 x10E3/uL  Comprehensive metabolic panel  Result Value Ref Range   Glucose 104 (H) 70 - 99 mg/dL   BUN 22 8 - 27 mg/dL   Creatinine, Ser 0.96 0.57 - 1.00 mg/dL   eGFR 64 >59 mL/min/1.73   BUN/Creatinine Ratio 23 12 - 28   Sodium 139 134 - 144 mmol/L   Potassium 4.3 3.5 - 5.2 mmol/L   Chloride 101 96 - 106 mmol/L   CO2 24 20 - 29 mmol/L   Calcium 9.3 8.7 - 10.3 mg/dL   Total Protein 7.4 6.0 - 8.5 g/dL   Albumin 4.2 3.8 - 4.8 g/dL   Globulin, Total 3.2 1.5 - 4.5 g/dL   Albumin/Globulin Ratio 1.3 1.2 - 2.2   Bilirubin Total <0.2 0.0 - 1.2 mg/dL   Alkaline Phosphatase 124 (H) 44 - 121 IU/L   AST 14 0 - 40 IU/L   ALT 11 0 - 32 IU/L  Lipid Panel w/o Chol/HDL Ratio  Result Value Ref Range   Cholesterol, Total 215 (H) 100 - 199 mg/dL   Triglycerides 137 0 - 149 mg/dL   HDL 59 >39 mg/dL   VLDL Cholesterol Cal 24 5 - 40 mg/dL   LDL Chol Calc (NIH) 132 (H) 0 - 99 mg/dL  TSH  Result Value Ref Range   TSH 1.510 0.450 - 4.500 uIU/mL  Urinalysis, Routine w reflex microscopic  Result Value Ref Range   Specific Gravity, UA >1.030 (H) 1.005 - 1.030   pH, UA 5.5 5.0 - 7.5   Color, UA Yellow Yellow   Appearance Ur Clear Clear   Leukocytes,UA Negative Negative   Protein,UA Negative Negative/Trace   Glucose, UA Negative Negative   Ketones, UA Negative Negative   RBC, UA Negative Negative   Bilirubin, UA Negative Negative   Urobilinogen, Ur 0.2 0.2 - 1.0 mg/dL   Nitrite, UA Negative Negative      Assessment & Plan:   Problem List Items Addressed This Visit   None Visit Diagnoses     Concussion  with unknown loss of consciousness status, subsequent encounter    -  Primary   Resolving. No more headache or dizziness. OK to return to work. Call with any concerns.         Follow up plan: Return September follow up.  15 minutes spent with patient today

## 2022-03-07 ENCOUNTER — Other Ambulatory Visit: Payer: Self-pay | Admitting: Family Medicine

## 2022-03-07 DIAGNOSIS — M25572 Pain in left ankle and joints of left foot: Secondary | ICD-10-CM

## 2022-03-09 NOTE — Telephone Encounter (Signed)
Requested medication (s) are due for refill today:yes  Requested medication (s) are on the active medication list: yes  Last refill:  Zofran: 12/30/21  #60 1 RF          cyclobenzaprine: 12/15/21 #30 1 RF  Future visit scheduled: no  Notes to clinic:  medications not delegated to NT to RF   Requested Prescriptions  Pending Prescriptions Disp Refills   ondansetron (ZOFRAN) 4 MG tablet [Pharmacy Med Name: ONDANSETRON HCL 4 MG TABLET] 60 tablet 1    Sig: Take 1 tablet as needed for nausea upto 2 times a day     Not Delegated - Gastroenterology: Antiemetics - ondansetron Failed - 03/07/2022 12:51 PM      Failed - This refill cannot be delegated      Passed - AST in normal range and within 360 days    AST  Date Value Ref Range Status  12/15/2021 14 0 - 40 IU/L Final   AST (SGOT) Piccolo, Waived  Date Value Ref Range Status  11/12/2016 19 11 - 38 U/L Final         Passed - ALT in normal range and within 360 days    ALT  Date Value Ref Range Status  12/15/2021 11 0 - 32 IU/L Final   ALT (SGPT) Piccolo, Waived  Date Value Ref Range Status  11/12/2016 15 10 - 47 U/L Final         Passed - Valid encounter within last 6 months    Recent Outpatient Visits           3 weeks ago Concussion with unknown loss of consciousness status, subsequent encounter   [facility] [place], Megan P, DO   4 weeks ago Concussion with unknown loss of consciousness status, initial encounter   [employer], Megan P, DO   2 months ago Right arm pain   [facility] [place], [place], DO   1 year ago COVID-19   [employer], [place], DO   1 year ago Abscess   [facility] [place], Avanti, MD               cyclobenzaprine (FLEXERIL) 5 MG tablet [Pharmacy Med Name: CYCLOBENZAPRINE 5 MG TABLET] 30 tablet 1    Sig: TAKE 1/2 OF A TABLET BY MOUTH TWICE A DAY AS NEEDED FOR MUSCLE SPASMS     Not Delegated - Analgesics:  Muscle  Relaxants Failed - 03/07/2022 12:51 PM      Failed - This refill cannot be delegated      Passed - Valid encounter within last 6 months    Recent Outpatient Visits           3 weeks ago Concussion with unknown loss of consciousness status, subsequent encounter   [facility] [place], Megan P, DO   4 weeks ago Concussion with unknown loss of consciousness status, initial encounter   [employer], Megan P, DO   2 months ago Right arm pain   [facility] [place], [place], DO   1 year ago COVID-19   [employer], [place], DO   1 year ago Abscess   [facility] Vigg, Roma Schanz, MD

## 2022-03-10 NOTE — Telephone Encounter (Signed)
Unable to leave vm to ask pt to schedule an appt

## 2022-03-11 NOTE — Telephone Encounter (Signed)
2nd attempt to reach patient to schedule her follow up appointment in September

## 2022-04-11 ENCOUNTER — Other Ambulatory Visit: Payer: Self-pay | Admitting: Family Medicine

## 2022-04-13 NOTE — Telephone Encounter (Signed)
Requested Prescriptions  Pending Prescriptions Disp Refills  . traZODone (DESYREL) 100 MG tablet [Pharmacy Med Name: TRAZODONE 100 MG TABLET] 135 tablet 1    Sig: TAKE 1-1.5 TABLETS (100-150 MG TOTAL) BY MOUTH AT BEDTIME AS NEEDED. FOR SLEEP     Psychiatry: Antidepressants - Serotonin Modulator Passed - 04/11/2022  3:35 AM      Passed - Completed PHQ-2 or PHQ-9 in the last 360 days      Passed - Valid encounter within last 6 months    Recent Outpatient Visits          1 month ago Concussion with unknown loss of consciousness status, subsequent encounter   [facility] [place], Megan P, DO   2 months ago Concussion with unknown loss of consciousness status, initial encounter   [facility] [place], Megan P, DO   3 months ago Right arm pain   [facility] [place], [place], DO   1 year ago COVID-19   [employer], [place], DO   1 year ago Abscess   [facility] [place], Roma Schanz, MD

## 2022-05-09 ENCOUNTER — Other Ambulatory Visit: Payer: Self-pay | Admitting: Family Medicine

## 2022-05-09 DIAGNOSIS — M25572 Pain in left ankle and joints of left foot: Secondary | ICD-10-CM

## 2022-05-11 NOTE — Telephone Encounter (Signed)
Requested medication (s) are due for refill today: yes  Requested medication (s) are on the active medication list: yes  Last refill:  03/11/22 for each  Future visit scheduled: no  Notes to clinic:  meds not delegated to NT to RF   Requested Prescriptions  Pending Prescriptions Disp Refills   ondansetron (ZOFRAN) 4 MG tablet [Pharmacy Med Name: ONDANSETRON HCL 4 MG TABLET] 60 tablet 1    Sig: TAKE 1 TABLET AS NEEDED FOR NAUSEA UPTO 2 TIMES A DAY     Not Delegated - Gastroenterology: Antiemetics - ondansetron Failed - 05/09/2022 12:19 PM      Failed - This refill cannot be delegated      Passed - AST in normal range and within 360 days    AST  Date Value Ref Range Status  12/15/2021 14 0 - 40 IU/L Final   AST (SGOT) Piccolo, Waived  Date Value Ref Range Status  11/12/2016 19 11 - 38 U/L Final         Passed - ALT in normal range and within 360 days    ALT  Date Value Ref Range Status  12/15/2021 11 0 - 32 IU/L Final   ALT (SGPT) Piccolo, Waived  Date Value Ref Range Status  11/12/2016 15 10 - 47 U/L Final         Passed - Valid encounter within last 6 months    Recent Outpatient Visits           2 months ago Concussion with unknown loss of consciousness status, subsequent encounter   [employer], Megan P, DO   3 months ago Concussion with unknown loss of consciousness status, initial encounter   [employer], Megan P, DO   4 months ago Right arm pain   [facility] [place], [place], DO   1 year ago COVID-19   [employer], [place], DO   1 year ago Abscess   [facility] Vigg, Avanti, MD               cyclobenzaprine (FLEXERIL) 5 MG tablet [Pharmacy Med Name: CYCLOBENZAPRINE 5 MG TABLET] 30 tablet 1    Sig: TAKE 1/2 OF A TABLET BY MOUTH TWICE A DAY AS NEEDED FOR MUSCLE SPASMS     Not Delegated - Analgesics:  Muscle Relaxants Failed - 05/09/2022 12:19 PM      Failed - This  refill cannot be delegated      Passed - Valid encounter within last 6 months    Recent Outpatient Visits           2 months ago Concussion with unknown loss of consciousness status, subsequent encounter   [facility] [place], Megan P, DO   3 months ago Concussion with unknown loss of consciousness status, initial encounter   [employer], Megan P, DO   4 months ago Right arm pain   [facility] [place], [place], DO   1 year ago COVID-19   [employer], [place], DO   1 year ago Abscess   [facility], Roma Schanz, MD

## 2022-05-27 ENCOUNTER — Telehealth: Payer: Self-pay

## 2022-05-27 NOTE — Telephone Encounter (Signed)
Copied from CRM [id number]. Topic: General - Other >> May 26, 2022  4:57 PM Ja-Kwan M wrote: Reason for CRM: Pt stated she is about to start a new job and will need to get some vaccinations. Pt did not have the names of the vaccinations needed at the time of the call but stated she had a list of which vaccines are needed. Pt requests call back. Cb# [phone]

## 2022-05-28 NOTE — Telephone Encounter (Signed)
Attempted to contact patient - Annette Miller.

## 2022-05-29 NOTE — Telephone Encounter (Signed)
Attempted to contact patient NA/LVM to call back   

## 2022-06-04 NOTE — Telephone Encounter (Signed)
Attempted to contact patient - Annette Miller.

## 2022-06-06 ENCOUNTER — Other Ambulatory Visit: Payer: Self-pay | Admitting: Family Medicine

## 2022-06-09 NOTE — Telephone Encounter (Signed)
Requested medication (s) are due for refill today:yes  Requested medication (s) are on the active medication list:yes  Last refill:  12/15/21  Future visit scheduled: no  Notes to clinic:  Unable to refill per protocol, cannot delegate.      Requested Prescriptions  Pending Prescriptions Disp Refills   traZODone (DESYREL) 100 MG tablet [Pharmacy Med Name: traZODone HCl 100 MG Oral Tablet] 135 tablet 0    Sig: TAKE 1 TO 1 & 1/2 (ONE TO ONE & ONE-HALF) TABLETS BY MOUTH AT BEDTIME AS NEEDED FOR SLEEP     Psychiatry: Antidepressants - Serotonin Modulator Passed - 06/06/2022 10:20 AM      Passed - Completed PHQ-2 or PHQ-9 in the last 360 days      Passed - Valid encounter within last 6 months    Recent Outpatient Visits           3 months ago Concussion with unknown loss of consciousness status, subsequent encounter   [facility] [place], Megan P, DO   4 months ago Concussion with unknown loss of consciousness status, initial encounter   [employer], Megan P, DO   5 months ago Right arm pain   [facility] [place], [place], DO   1 year ago COVID-19   [employer], Megan P, DO   1 year ago Abscess   [facility] Vigg, Avanti, MD               diazepam (VALIUM) 5 MG tablet [Pharmacy Med Name: diazePAM 5 MG Oral Tablet] 30 tablet 0    Sig: TAKE 1 TABLET BY MOUTH AT BEDTIME AS NEEDED FOR ANXIETY     Not Delegated - Psychiatry: Anxiolytics/Hypnotics 2 Failed - 06/06/2022 10:20 AM      Failed - This refill cannot be delegated      Failed - Urine Drug Screen completed in last 360 days      Passed - Patient is not pregnant      Passed - Valid encounter within last 6 months    Recent Outpatient Visits           3 months ago Concussion with unknown loss of consciousness status, subsequent encounter   [employer], Megan P, DO   4 months ago Concussion with unknown loss of  consciousness status, initial encounter   [employer], Megan P, DO   5 months ago Right arm pain   [facility] [place], [place], DO   1 year ago COVID-19   [employer], [place], DO   1 year ago Abscess   [facility], Roma Schanz, MD

## 2022-06-09 NOTE — Telephone Encounter (Signed)
appt

## 2022-06-10 NOTE — Telephone Encounter (Signed)
LVM asking patient to call back to schedule an appointment 

## 2022-06-11 NOTE — Telephone Encounter (Signed)
2nd attempt to reach patient.

## 2022-06-21 ENCOUNTER — Other Ambulatory Visit: Payer: Self-pay | Admitting: Family Medicine

## 2022-06-22 NOTE — Telephone Encounter (Signed)
Requested medication (s) are due for refill today: Yes  Requested medication (s) are on the active medication list: Yes  Last refill:  05/11/22  Future visit scheduled: No  Notes to clinic:  See request.    Requested Prescriptions  Pending Prescriptions Disp Refills   ondansetron (ZOFRAN) 4 MG tablet [Pharmacy Med Name: ONDANSETRON HCL 4 MG TABLET] 60 tablet 1    Sig: TAKE 1 TABLET AS NEEDED FOR NAUSEA UPTO 2 TIMES A DAY     Not Delegated - Gastroenterology: Antiemetics - ondansetron Failed - 06/21/2022 10:05 PM      Failed - This refill cannot be delegated      Passed - AST in normal range and within 360 days    AST  Date Value Ref Range Status  12/15/2021 14 0 - 40 IU/L Final   AST (SGOT) Piccolo, Waived  Date Value Ref Range Status  11/12/2016 19 11 - 38 U/L Final         Passed - ALT in normal range and within 360 days    ALT  Date Value Ref Range Status  12/15/2021 11 0 - 32 IU/L Final   ALT (SGPT) Piccolo, Waived  Date Value Ref Range Status  11/12/2016 15 10 - 47 U/L Final         Passed - Valid encounter within last 6 months    Recent Outpatient Visits           4 months ago Concussion with unknown loss of consciousness status, subsequent encounter   [employer], Megan P, DO   4 months ago Concussion with unknown loss of consciousness status, initial encounter   [employer], Megan P, DO   6 months ago Right arm pain   [place], [place], DO   1 year ago COVID-19   [employer], Megan P, DO   1 year ago Abscess   [facility] [place], Avanti, MD              Signed Prescriptions Disp Refills   buPROPion (WELLBUTRIN SR) 150 MG 12 hr tablet 180 tablet 1    Sig: TAKE 1 TABLET BY MOUTH TWICE A DAY     Psychiatry: Antidepressants - bupropion Passed - 06/21/2022 10:05 PM      Passed - Cr in normal range and within 360 days    Creatinine, Ser  Date Value Ref  Range Status  12/15/2021 0.96 0.57 - 1.00 mg/dL Final         Passed - AST in normal range and within 360 days    AST  Date Value Ref Range Status  12/15/2021 14 0 - 40 IU/L Final   AST (SGOT) Piccolo, Waived  Date Value Ref Range Status  11/12/2016 19 11 - 38 U/L Final         Passed - ALT in normal range and within 360 days    ALT  Date Value Ref Range Status  12/15/2021 11 0 - 32 IU/L Final   ALT (SGPT) Piccolo, Waived  Date Value Ref Range Status  11/12/2016 15 10 - 47 U/L Final         Passed - Completed PHQ-2 or PHQ-9 in the last 360 days      Passed - Last BP in normal range    BP Readings from Last 1 Encounters:  02/16/22 98/64         Passed - Valid encounter within last 6  months    Recent Outpatient Visits           4 months ago Concussion with unknown loss of consciousness status, subsequent encounter   [facility] [place], Megan P, DO   4 months ago Concussion with unknown loss of consciousness status, initial encounter   [facility] [place], Megan P, DO   6 months ago Right arm pain   [facility] [place], [place], DO   1 year ago COVID-19   [facility] [place], [place], DO   1 year ago Abscess   [facility] Loura Pardon, MD

## 2022-06-22 NOTE — Telephone Encounter (Signed)
Requested Prescriptions  Pending Prescriptions Disp Refills  . ondansetron (ZOFRAN) 4 MG tablet [Pharmacy Med Name: ONDANSETRON HCL 4 MG TABLET] 60 tablet 1    Sig: TAKE 1 TABLET AS NEEDED FOR NAUSEA UPTO 2 TIMES A DAY     Not Delegated - Gastroenterology: Antiemetics - ondansetron Failed - 06/21/2022 10:05 PM      Failed - This refill cannot be delegated      Passed - AST in normal range and within 360 days    AST  Date Value Ref Range Status  12/15/2021 14 0 - 40 IU/L Final   AST (SGOT) Piccolo, Waived  Date Value Ref Range Status  11/12/2016 19 11 - 38 U/L Final         Passed - ALT in normal range and within 360 days    ALT  Date Value Ref Range Status  12/15/2021 11 0 - 32 IU/L Final   ALT (SGPT) Piccolo, Waived  Date Value Ref Range Status  11/12/2016 15 10 - 47 U/L Final         Passed - Valid encounter within last 6 months    Recent Outpatient Visits          4 months ago Concussion with unknown loss of consciousness status, subsequent encounter   [place], Megan P, DO   4 months ago Concussion with unknown loss of consciousness status, initial encounter   [employer], Megan P, DO   6 months ago Right arm pain   [place], [place], DO   1 year ago COVID-19   [employer], Megan P, DO   1 year ago Abscess   [facility] Vigg, Avanti, MD             . buPROPion (WELLBUTRIN SR) 150 MG 12 hr tablet [Pharmacy Med Name: BUPROPION HCL SR 150 MG TABLET] 180 tablet 1    Sig: TAKE 1 TABLET BY MOUTH TWICE A DAY     Psychiatry: Antidepressants - bupropion Passed - 06/21/2022 10:05 PM      Passed - Cr in normal range and within 360 days    Creatinine, Ser  Date Value Ref Range Status  12/15/2021 0.96 0.57 - 1.00 mg/dL Final         Passed - AST in normal range and within 360 days    AST  Date Value Ref Range Status  12/15/2021 14 0 - 40 IU/L Final   AST (SGOT)  Piccolo, Waived  Date Value Ref Range Status  11/12/2016 19 11 - 38 U/L Final         Passed - ALT in normal range and within 360 days    ALT  Date Value Ref Range Status  12/15/2021 11 0 - 32 IU/L Final   ALT (SGPT) Piccolo, Waived  Date Value Ref Range Status  11/12/2016 15 10 - 47 U/L Final         Passed - Completed PHQ-2 or PHQ-9 in the last 360 days      Passed - Last BP in normal range    BP Readings from Last 1 Encounters:  02/16/22 98/64         Passed - Valid encounter within last 6 months    Recent Outpatient Visits          4 months ago Concussion with unknown loss of consciousness status, subsequent encounter   [place], Megan P, DO  4 months ago Concussion with unknown loss of consciousness status, initial encounter   [facility] [place], Megan P, DO   6 months ago Right arm pain   [facility] [place], [place], DO   1 year ago COVID-19   [facility] [place], [place], DO   1 year ago Abscess   [facility] Loura Pardon, MD

## 2022-06-23 ENCOUNTER — Other Ambulatory Visit: Payer: Self-pay | Admitting: Family Medicine

## 2022-06-23 NOTE — Telephone Encounter (Signed)
Requested Prescriptions  Pending Prescriptions Disp Refills  . pantoprazole (PROTONIX) 40 MG tablet [Pharmacy Med Name: PANTOPRAZOLE SOD DR 40 MG TAB] 180 tablet 1    Sig: TAKE 1 TABLET BY MOUTH TWICE A DAY     Gastroenterology: Proton Pump Inhibitors Passed - 06/23/2022  2:04 AM      Passed - Valid encounter within last 12 months    Recent Outpatient Visits          4 months ago Concussion with unknown loss of consciousness status, subsequent encounter   [place], Megan P, DO   4 months ago Concussion with unknown loss of consciousness status, initial encounter   [place], Megan P, DO   6 months ago Right arm pain   [place], [place], DO   1 year ago [place], [place], DO   1 year ago Abscess   [facility] Vigg, [place], MD

## 2022-06-23 NOTE — Telephone Encounter (Signed)
Appt please

## 2022-06-26 NOTE — Telephone Encounter (Signed)
Attempted to call patient to set up an appointment. Unable to leave a voicemail

## 2022-07-01 ENCOUNTER — Telehealth: Payer: Self-pay | Admitting: Family Medicine

## 2022-07-01 NOTE — Telephone Encounter (Signed)
Copied from [place]. Topic: Medicare AWV >> Jul 01, 2022 10:52 AM Josephina Gip wrote: Reason for CRM: N/A unable to leave a message for patient to call back and schedule the Medicare Annual Wellness Visit (AWV) virtually or by telephone.  Last AWV 04/03/19  Please schedule at anytime with [facility]-Nurse Health Advisor.   Any questions, please call me at [phone]

## 2022-07-06 ENCOUNTER — Telehealth: Payer: Self-pay | Admitting: *Deleted

## 2022-07-06 NOTE — Telephone Encounter (Signed)
Pt states increasing Trazodone to 3 tabs at HS was discussed at last OV. States pharmacy needs updated prescription. Also reports "Three tabs are not working, still waking up several times during night." States has 1- 1 1/2 weeks left of med. Assured pt NT would route to practice for PCPs review.  Please advise."

## 2022-07-07 NOTE — Telephone Encounter (Signed)
Pt was overdue for an appointment. I have her scheduled for 07/20/2022.

## 2022-07-14 ENCOUNTER — Telehealth: Payer: Self-pay

## 2022-07-14 NOTE — Telephone Encounter (Signed)
Patient called stating that she has started to have abdominal pain and that she feels that her insides are about to "fall out". Patient denied fever, chills, nausea or vomiting. Patient is requesting an appointment as soon as possible to see you. Last time patient was seen was on 05/17/2019. Please advise.

## 2022-07-14 NOTE — Telephone Encounter (Signed)
Needs to [place] care with [place]- needs a new patient appoitnment - in interim needs to see pcp

## 2022-07-14 NOTE — Telephone Encounter (Signed)
Autumn, can you please call patient back and offer her an appointment with Dr. Georgeann Oppenheim next available. Thank you so much.

## 2022-07-15 NOTE — Telephone Encounter (Signed)
I will send her a MyChart message letting her know that she will need to call and speak with our front desk to schedule an appointment with Dr. Vicente Males. Please try calling her again until she gets an appointment. Thank you.

## 2022-07-20 ENCOUNTER — Ambulatory Visit: Payer: Medicare Other | Admitting: Family Medicine

## 2022-07-30 ENCOUNTER — Encounter: Payer: Self-pay | Admitting: Family Medicine

## 2022-07-30 ENCOUNTER — Ambulatory Visit (INDEPENDENT_AMBULATORY_CARE_PROVIDER_SITE_OTHER): Payer: Medicare Other | Admitting: Family Medicine

## 2022-07-30 VITALS — BP 110/72 | HR 93 | Temp 98.2°F | Wt 91.6 lb

## 2022-07-30 DIAGNOSIS — Z720 Tobacco use: Secondary | ICD-10-CM | POA: Diagnosis not present

## 2022-07-30 DIAGNOSIS — Z1231 Encounter for screening mammogram for malignant neoplasm of breast: Secondary | ICD-10-CM

## 2022-07-30 DIAGNOSIS — Z13820 Encounter for screening for osteoporosis: Secondary | ICD-10-CM

## 2022-07-30 DIAGNOSIS — F419 Anxiety disorder, unspecified: Secondary | ICD-10-CM | POA: Diagnosis not present

## 2022-07-30 DIAGNOSIS — F3342 Major depressive disorder, recurrent, in full remission: Secondary | ICD-10-CM | POA: Diagnosis not present

## 2022-07-30 DIAGNOSIS — Z0000 Encounter for general adult medical examination without abnormal findings: Secondary | ICD-10-CM

## 2022-07-30 DIAGNOSIS — E559 Vitamin D deficiency, unspecified: Secondary | ICD-10-CM | POA: Diagnosis not present

## 2022-07-30 DIAGNOSIS — R42 Dizziness and giddiness: Secondary | ICD-10-CM

## 2022-07-30 DIAGNOSIS — E039 Hypothyroidism, unspecified: Secondary | ICD-10-CM | POA: Diagnosis not present

## 2022-07-30 DIAGNOSIS — S060XAA Concussion with loss of consciousness status unknown, initial encounter: Secondary | ICD-10-CM | POA: Diagnosis not present

## 2022-07-30 DIAGNOSIS — Z7189 Other specified counseling: Secondary | ICD-10-CM

## 2022-07-30 DIAGNOSIS — E785 Hyperlipidemia, unspecified: Secondary | ICD-10-CM

## 2022-07-30 DIAGNOSIS — R634 Abnormal weight loss: Secondary | ICD-10-CM

## 2022-07-30 DIAGNOSIS — E538 Deficiency of other specified B group vitamins: Secondary | ICD-10-CM | POA: Diagnosis not present

## 2022-07-30 DIAGNOSIS — F5104 Psychophysiologic insomnia: Secondary | ICD-10-CM

## 2022-07-30 DIAGNOSIS — M25572 Pain in left ankle and joints of left foot: Secondary | ICD-10-CM | POA: Diagnosis not present

## 2022-07-30 LAB — URINALYSIS, ROUTINE W REFLEX MICROSCOPIC
Bilirubin, UA: NEGATIVE
Glucose, UA: NEGATIVE
Leukocytes,UA: NEGATIVE
Nitrite, UA: NEGATIVE
RBC, UA: NEGATIVE
Specific Gravity, UA: >1.03 — ABNORMAL HIGH (ref 1.005–1.030)
Urobilinogen, Ur: 0.2 mg/dL (ref 0.2–1.0)
pH, UA: 5.5 (ref 5.0–7.5)

## 2022-07-30 LAB — MICROSCOPIC EXAMINATION: Bacteria, UA: NONE SEEN

## 2022-07-30 MED ORDER — CYCLOBENZAPRINE HCL 5 MG PO TABS: 5.0000 mg | ORAL_TABLET | Freq: Every day | ORAL | 1 refills | Status: DC

## 2022-07-30 MED ORDER — GABAPENTIN 300 MG PO CAPS: ORAL_CAPSULE | ORAL | 1 refills | Status: DC

## 2022-07-30 MED ORDER — ROSUVASTATIN CALCIUM 10 MG PO TABS: 10.0000 mg | ORAL_TABLET | Freq: Every day | ORAL | 1 refills | Status: DC

## 2022-07-30 MED ORDER — QUETIAPINE FUMARATE 25 MG PO TABS: 25.0000 mg | ORAL_TABLET | Freq: Every day | ORAL | 2 refills | Status: DC

## 2022-07-30 MED ORDER — TRAZODONE HCL 100 MG PO TABS: 100.0000 mg | ORAL_TABLET | Freq: Every evening | ORAL | 1 refills | Status: DC | PRN

## 2022-07-30 NOTE — Progress Notes (Signed)
BP 110/72   Pulse 93   Temp 98.2 F (36.8 C)   Wt 91 lb 9.6 oz (41.5 kg)   BMI 17.03 kg/m    Subjective:    Patient ID: Annette Miller, female    DOB: Oct 14, 1950, 71 y.o.   MRN: [id number]  HPI: Annette Miller is a 71 y.o. female presenting on 07/30/2022 for comprehensive medical examination. Current medical complaints include:  HYPOTHYROIDISM Thyroid control status: unsure Satisfied with current treatment? yes Medication side effects: no Medication compliance: excellent compliance Recent dose adjustment:no Fatigue: no Cold intolerance: no Heat intolerance: no Weight gain: no Weight loss: yes Constipation: no Diarrhea/loose stools: no Palpitations: no Lower extremity edema: no Anxiety/depressed mood: yes  HYPERLIPIDEMIA Hyperlipidemia status: excellent compliance Satisfied with current treatment?  no Side effects:  no Medication compliance: excellent compliance Past cholesterol meds: crestor Supplements: none Aspirin:  no The 10-year ASCVD risk score (Arnett DK, et al., 2019) is: 13.2%   Values used to calculate the score:     Age: 33 years     Sex: Female     Is Non-Hispanic African American: No     Diabetic: No     Tobacco smoker: Yes     Systolic Blood Pressure: 299 mmHg     Is BP treated: No     HDL Cholesterol: 49 mg/dL     Total Cholesterol: 230 mg/dL Chest pain:  no Coronary artery disease:  no  ANXIETY/DEPRESSION- has not been doing well. Has been jittery and unable to sleep. Has been having more trouble with balance Duration: chronic, but worse in the past couple of months Status:exacerbated Anxious mood: yes  Excessive worrying: yes Irritability: yes  Sweating: yes Nausea: no Palpitations:yes Hyperventilation: no Panic attacks: no Agoraphobia: no  Obscessions/compulsions: no Depressed mood: no    07/30/2022    2:02 PM 02/09/2022    2:00 PM 12/15/2021    8:25 AM 12/24/2020    1:08 PM 07/16/2020   10:48 AM  Depression screen PHQ 2/9   Decreased Interest 1 0 1 0 0  Down, Depressed, Hopeless 3 0 0 1 0  PHQ - 2 Score 4 0 1 1 0  Altered sleeping _0 0  Tired, decreased energy _1 0  Change in appetite _2 0  Feeling bad or failure about yourself  1 0 0 0 0  Trouble concentrating 2 0 0 0 0  Moving slowly or fidgety/restless 0 1 2 0 0  Suicidal thoughts 0 0 0 0 0  PHQ-9 Score _3 0  Difficult doing work/chores   Somewhat difficult Not difficult at all Not difficult at all   Anhedonia: no Weight changes: no Insomnia: yes hard to fall asleep  Hypersomnia: no Fatigue/loss of energy: yes Feelings of worthlessness: no Feelings of guilt: no Impaired concentration/indecisiveness: no Suicidal ideations: no  Crying spells: no Recent Stressors/Life Changes: no   Relationship problems: no   Family stress: no     Financial stress: no    Job stress: no    Recent death/loss: no  Menopausal Symptoms: no  Functional Status Survey: Is the patient deaf or have difficulty hearing?: No Does the patient have difficulty seeing, even when wearing glasses/contacts?: No Does the patient have difficulty concentrating, remembering, or making decisions?: Yes Does the patient have difficulty walking or climbing stairs?: Yes Does the patient have difficulty dressing or bathing?: No Does the patient have difficulty doing errands alone  such as visiting a doctor's office or shopping?: No     07/30/2022    2:02 PM 02/09/2022    2:00 PM 12/15/2021    8:24 AM 12/24/2020    1:08 PM 07/24/2020    3:36 PM  [place] in the past year? 1 1 0 1 0  Number falls in past yr: 1 1 0 0   Injury with Fall? 1 1 0 1   Risk for fall due to : History of fall(s) History of fall(s) No Fall Risks No Fall Risks   Follow up Falls evaluation completed Falls evaluation completed Falls evaluation completed Falls evaluation completed     Depression Screen    07/30/2022    2:02 PM 02/09/2022    2:00 PM 12/15/2021    8:25 AM 12/24/2020     1:08 PM 07/16/2020   10:48 AM  Depression screen PHQ 2/9  Decreased Interest 1 0 1 0 0  Down, Depressed, Hopeless 3 0 0 1 0  PHQ - 2 Score 4 0 1 1 0  Altered sleeping _0 0  Tired, decreased energy _1 0  Change in appetite _2 0  Feeling bad or failure about yourself  1 0 0 0 0  Trouble concentrating 2 0 0 0 0  Moving slowly or fidgety/restless 0 1 2 0 0  Suicidal thoughts 0 0 0 0 0  PHQ-9 Score _3 0  Difficult doing work/chores   Somewhat difficult Not difficult at all Not difficult at all   Advanced Directives Does patient have a HCPOA?    no Does patient have a living will or MOST form?  no  Past Medical History:  Past Medical History:  Diagnosis Date   Allergy    Anemia    improved since surgery in May 2019   Anxiety    Arthritis    Depression    GERD (gastroesophageal reflux disease) 2019   PUD, history of diverticulitis   Goiter    H/O cardiac arrest 03/2018   s/p surgery and while in hospital. no cardiac history   Headache    History of blood transfusion 01/2018   Hyperlipidemia    Hypothyroidism    Osteoporosis     Surgical History:  Past Surgical History:  Procedure Laterality Date   ABDOMINAL HYSTERECTOMY  1979   APPENDECTOMY  01/2018   COLONOSCOPY WITH PROPOFOL N/A 01/14/2018   Procedure: COLONOSCOPY WITH PROPOFOL;  Surgeon: Lin Landsman, MD;  Location: [facility] ENDOSCOPY;  Service: Gastroenterology;  Laterality: N/A;   COLONOSCOPY WITH PROPOFOL N/A 06/24/2018   Procedure: COLONOSCOPY WITH PROPOFOL;  Surgeon: Jonathon Bellows, MD;  Location[facility] ENDOSCOPY;  Service: Gastroenterology;  Laterality: N/A;   COLONOSCOPY WITH PROPOFOL N/A 05/24/2019   Procedure: COLONOSCOPY WITH PROPOFOL;  Surgeon: Jonathon Bellows, MD;  Location: [facility] ENDOSCOPY;  Service: Gastroenterology;  Laterality: N/A;   COLOSTOMY TAKEDOWN N/A 08/30/2018   Procedure: COLOSTOMY TAKEDOWN;  Surgeon: Jules Husbands, MD;  Location: [facility] ORS;  Service: General;  Laterality: N/A;    DIAGNOSTIC LAPAROSCOPY     ESOPHAGOGASTRODUODENOSCOPY N/A 04/06/2018   Procedure: ESOPHAGOGASTRODUODENOSCOPY (EGD);  Surgeon: Lin Landsman, MD;  Location: [facility] ENDOSCOPY;  Service: Gastroenterology;  Laterality: N/A;   ESOPHAGOGASTRODUODENOSCOPY (EGD) WITH PROPOFOL N/A 01/14/2018   Procedure: ESOPHAGOGASTRODUODENOSCOPY (EGD) WITH PROPOFOL;  Surgeon: Lin Landsman, MD;  Location: [facility] ENDOSCOPY;  Service: Gastroenterology;  Laterality: N/A;   ESOPHAGOGASTRODUODENOSCOPY (EGD) WITH PROPOFOL N/A  06/24/2018   Procedure: ESOPHAGOGASTRODUODENOSCOPY (EGD) WITH PROPOFOL;  Surgeon: Jonathon Bellows, MD;  Location: [facility] ENDOSCOPY;  Service: Gastroenterology;  Laterality: N/A;   FLEXIBLE SIGMOIDOSCOPY N/A 06/28/2019   Procedure: FLEXIBLE SIGMOIDOSCOPY with Dilation;  Surgeon: Jonathon Bellows, MD;  Location: [facility] ENDOSCOPY;  Service: Gastroenterology;  Laterality: N/A;   FLEXIBLE SIGMOIDOSCOPY N/A 07/20/2019   Procedure: FLEXIBLE SIGMOIDOSCOPY;  Surgeon: Jonathon Bellows, MD;  Location: [facility] ENDOSCOPY;  Service: Gastroenterology;  Laterality: N/A;   INCISION AND DRAINAGE ABSCESS Right 07/18/2020   Procedure: INCISION AND DRAINAGE ABSCESS, axillary abscess;  Surgeon: Jules Husbands, MD;  Location: [facility] ORS;  Service: General;  Laterality: Right;   LAPAROTOMY N/A 02/25/2018   Procedure: EXPLORATORY LAPAROTOMY, PARTMAN'S PROCEDURE, APPENDECTOMY, COLOSTOMY;  Surgeon: Hadley Pen, DO;  Location: [facility] ORS;  Service: General;  Laterality: N/A;   [place] N/A 08/30/2018   Procedure: HERNIA REPAIR VENTRAL ADULT;  Surgeon: Jules Husbands, MD;  Location: [facility] ORS;  Service: General;  Laterality: N/A;   VISCERAL ANGIOGRAPHY N/A 04/07/2018   Procedure: VISCERAL ANGIOGRAPHY;  Surgeon: Algernon Huxley, MD;  Location: [place] CV LAB;  Service: Cardiovascular;  Laterality: N/A;    Medications:  Current Outpatient Medications on File Prior to Visit  Medication Sig   acetaminophen  (TYLENOL) 325 MG tablet Take 650 mg by mouth every 6 (six) hours as needed for mild pain or fever.   albuterol (VENTOLIN HFA) 108 (90 Base) MCG/ACT inhaler Inhale 2 puffs into the lungs every 6 (six) hours as needed for wheezing or shortness of breath.   Ascorbic Acid (VITAMIN C) 1000 MG tablet Take 3,000 mg by mouth daily.   Calcium Carb-Cholecalciferol (OYSTER SHELL CALCIUM PLUS D PO) Take 1 tablet by mouth daily.    Docusate Sodium (COLACE PO) Take 1 Dose by mouth daily.   fexofenadine (ALLEGRA ALLERGY) 180 MG tablet Take 1 tablet (180 mg total) by mouth daily.   levothyroxine (SYNTHROID) 25 MCG tablet TAKE 1 TABLET BY MOUTH EVERY DAY BEFORE BREAKFAST   magnesium 30 MG tablet Take 30 mg by mouth daily.    Multiple Vitamin (MULTIVITAMIN WITH MINERALS) TABS tablet Take 1 tablet by mouth daily.   niacin 250 MG CR capsule Take 250 mg by mouth at bedtime.   ondansetron (ZOFRAN) 4 MG tablet TAKE 1 TABLET AS NEEDED FOR NAUSEA UPTO 2 TIMES A DAY   pantoprazole (PROTONIX) 40 MG tablet TAKE 1 TABLET BY MOUTH TWICE A DAY   promethazine (PHENERGAN) 25 MG suppository Place 1 suppository (25 mg total) rectally every 6 (six) hours as needed for nausea or vomiting.   Selenium 200 MCG CAPS Take 200 mcg by mouth daily.    triamcinolone cream (KENALOG) 0.1 % Apply 1 application topically 2 (two) times daily.   vitamin B-12 (CYANOCOBALAMIN) 250 MCG tablet Take 250 mcg by mouth daily.   VITAMIN D PO Take by mouth. 1250 mcg   vitamin E 1000 UNIT capsule Take 1,000 Units by mouth 2 (two) times daily.   Zinc Sulfate (ZINC 15 PO) Take 15 mg by mouth daily.    diazepam (VALIUM) 5 MG tablet Take 1 tablet (5 mg total) by mouth at bedtime as needed for anxiety. (Patient not taking: Reported on 07/30/2022)   No current facility-administered medications on file prior to visit.    Allergies:  Allergies  Allergen Reactions   Aspirin     HISTORY OF PUD, DIVERTICULITIS, BOWEL PERFORATION   Prednisone Other (See  Comments)  Reaction: violence Can not take any kind of steroids   Codeine Diarrhea and Nausea And Vomiting   Tape Rash    Transpore tape: redness, irritation at site  Tegaderm somewhat okay. All tapes cause itching    Social History:  Social History   Socioeconomic History   Marital status: Widowed    Spouse name: Not on file   Number of children: Not on file   Years of education: Not on file   Highest education level: Associate degree: academic program  Occupational History    Comment: retired   Occupation: Psychologist, prison and probation services     Comment: only on weekends   Tobacco Use   Smoking status: Some Days    Packs/day: 0.10    Types: Cigarettes   Smokeless tobacco: Never   Tobacco comments:    smokes occasionally   Vaping Use   Vaping Use: Never used  Substance and Sexual Activity   Alcohol use: No   Drug use: No   Sexual activity: Not Currently  Other Topics Concern   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, historic buildings    Social Determinants of Health   Financial Resource Strain: [place] (04/03/2019)   Overall Financial Resource Strain (CARDIA)    Difficulty of Paying Living Expenses: Somewhat hard  Food Insecurity: No Food Insecurity (04/03/2019)   Hunger Vital Sign    Worried About Running Out of Food in the Last Year: Never true    [place] in the Last Year: Never true  Transportation Needs: No Transportation Needs (04/03/2019)   PRAPARE - Hydrologist (Medical): No    Lack of Transportation (Non-Medical): No  Physical Activity: Inactive (04/03/2019)   Exercise Vital Sign    Days of Exercise per Week: 0 days    Minutes of Exercise per Session: 0 min  Stress: No Stress Concern Present (04/03/2019)   [place]    Feeling of Stress : Not at all  Social Connections: Socially Isolated (04/03/2019)   Social  Connection and Isolation Panel [NHANES]    Frequency of Communication with Friends and Family: More than three times a week    Frequency of Social Gatherings with Friends and Family: More than three times a week    Attends Religious Services: Never    Marine scientist or Organizations: No    Attends Archivist Meetings: Never    Marital Status: Widowed  Intimate Partner Violence: Not At Risk (04/03/2019)   Humiliation, Afraid, Rape, and Kick questionnaire    Fear of Current or Ex-Partner: No    Emotionally Abused: No    Physically Abused: No    Sexually Abused: No   Social History   Tobacco Use  Smoking Status Some Days   Packs/day: 0.10   Types: Cigarettes  Smokeless Tobacco Never  Tobacco Comments   smokes occasionally    Social History   Substance and Sexual Activity  Alcohol Use No    Family History:  Family History  Problem Relation Age of Onset   Cancer Mother    AAA (abdominal aortic aneurysm) Father    Cancer Maternal Grandmother    Stroke Maternal Grandmother    Cancer Paternal Grandfather     Past medical history, surgical history, medications, allergies, family history and social history reviewed with patient today and changes made to appropriate areas of  the chart.   Review of Systems  Constitutional: Negative.   HENT: Negative.    Eyes: Negative.   Respiratory: Negative.    Cardiovascular: Negative.   Gastrointestinal: Negative.   Genitourinary: Negative.   Musculoskeletal: Negative.   Skin: Negative.   Neurological: Negative.   Endo/Heme/Allergies: Negative.   Psychiatric/Behavioral:  Negative for depression, hallucinations, memory loss, substance abuse and suicidal ideas. The patient is nervous/anxious. The patient does not have insomnia.     All other ROS negative except what is listed above and in the HPI.      Objective:    BP 110/72   Pulse 93   Temp 98.2 F (36.8 C)   Wt 91 lb 9.6 oz (41.5 kg)   BMI 17.03 kg/m   Wt  Readings from Last 3 Encounters:  07/30/22 91 lb 9.6 oz (41.5 kg)  02/16/22 101 lb 12.8 oz (46.2 kg)  02/09/22 106 lb 6.4 oz (48.3 kg)    Physical Exam Vitals and nursing note reviewed.  Constitutional:      General: She is not in acute distress.    Appearance: Normal appearance. She is not ill-appearing, toxic-appearing or diaphoretic.  HENT:     Head: Normocephalic and atraumatic.     Right Ear: Tympanic membrane, ear canal and external ear normal. There is no impacted cerumen.     Left Ear: Tympanic membrane, ear canal and external ear normal. There is no impacted cerumen.     Nose: Nose normal. No congestion or rhinorrhea.     Mouth/Throat:     Mouth: Mucous membranes are moist.     Pharynx: Oropharynx is clear. No oropharyngeal exudate or posterior oropharyngeal erythema.  Eyes:     General: No scleral icterus.       Right eye: No discharge.        Left eye: No discharge.     Extraocular Movements: Extraocular movements intact.     Conjunctiva/sclera: Conjunctivae normal.     Pupils: Pupils are equal, round, and reactive to light.  Neck:     Vascular: No carotid bruit.  Cardiovascular:     Rate and Rhythm: Normal rate and regular rhythm.     Pulses: Normal pulses.     Heart sounds: No murmur heard.    No friction rub. No gallop.  Pulmonary:     Effort: Pulmonary effort is normal. No respiratory distress.     Breath sounds: Normal breath sounds. No stridor. No wheezing, rhonchi or rales.  Chest:     Chest wall: No tenderness.  Abdominal:     General: Abdomen is flat. Bowel sounds are normal. There is no distension.     Palpations: Abdomen is soft. There is no mass.     Tenderness: There is no abdominal tenderness. There is no right CVA tenderness, left CVA tenderness, guarding or rebound.     Hernia: No hernia is present.  Genitourinary:    Comments: Breast and pelvic exams deferred with shared decision making Musculoskeletal:        General: No swelling, tenderness,  deformity or signs of injury.     Cervical back: Normal range of motion and neck supple. No rigidity. No muscular tenderness.     Right lower leg: No edema.     Left lower leg: No edema.  Lymphadenopathy:     Cervical: No cervical adenopathy.  Skin:    General: Skin is warm and dry.     Capillary Refill: Capillary refill takes less than 2 seconds.  Coloration: Skin is not jaundiced or pale.     Findings: No bruising, erythema, lesion or rash.  Neurological:     General: No focal deficit present.     Mental Status: She is alert and oriented to person, place, and time. Mental status is at baseline.     Cranial Nerves: No cranial nerve deficit.     Sensory: No sensory deficit.     Motor: No weakness.     Coordination: Coordination normal.     Gait: Gait normal.     Deep Tendon Reflexes: Reflexes normal.  Psychiatric:        Mood and Affect: Mood normal.        Behavior: Behavior normal.        Thought Content: Thought content normal.        Judgment: Judgment normal.        07/30/2022    2:32 PM 04/03/2019    2:59 PM  6CIT Screen  What Year? 0 points 0 points  What month? 0 points 0 points  What time? 0 points 0 points  Count back from 20 0 points 0 points  Months in reverse 0 points 0 points  Repeat phrase 2 points 0 points  Total Score 2 points 0 points    Results for orders placed or performed in visit on 07/30/22  Microscopic Examination   Urine  Result Value Ref Range   WBC, UA 0-5 0 - 5 /hpf   RBC, Urine 0-2 0 - 2 /hpf   Epithelial Cells (non renal) 0-10 0 - 10 /hpf   Bacteria, UA None seen None seen/Few  CBC with Differential/Platelet  Result Value Ref Range   WBC 4.8 3.4 - 10.8 x10E3/uL   RBC 3.67 (L) 3.77 - 5.28 x10E6/uL   Hemoglobin 12.3 11.1 - 15.9 g/dL   Hematocrit 36.7 34.0 - 46.6 %   MCV 100 (H) 79 - 97 fL   MCH 33.5 (H) 26.6 - 33.0 pg   MCHC 33.5 31.5 - 35.7 g/dL   RDW 11.5 (L) 11.7 - 15.4 %   Platelets 324 150 - 450 x10E3/uL   Neutrophils 50  Not Estab. %   Lymphs 39 Not Estab. %   Monocytes 6 Not Estab. %   Eos 4 Not Estab. %   Basos 1 Not Estab. %   Neutrophils Absolute 2.4 1.4 - 7.0 x10E3/uL   Lymphocytes Absolute 1.9 0.7 - 3.1 x10E3/uL   Monocytes Absolute 0.3 0.1 - 0.9 x10E3/uL   EOS (ABSOLUTE) 0.2 0.0 - 0.4 x10E3/uL   Basophils Absolute 0.0 0.0 - 0.2 x10E3/uL   Immature Granulocytes 0 Not Estab. %   Immature Grans (Abs) 0.0 0.0 - 0.1 x10E3/uL  Comprehensive metabolic panel  Result Value Ref Range   Glucose 104 (H) 70 - 99 mg/dL   BUN 16 8 - 27 mg/dL   Creatinine, Ser 1.05 (H) 0.57 - 1.00 mg/dL   eGFR 57 (L) >59 mL/min/1.73   BUN/Creatinine Ratio 15 12 - 28   Sodium 143 134 - 144 mmol/L   Potassium 4.6 3.5 - 5.2 mmol/L   Chloride 104 96 - 106 mmol/L   CO2 27 20 - 29 mmol/L   Calcium 9.3 8.7 - 10.3 mg/dL   Total Protein 6.6 6.0 - 8.5 g/dL   Albumin 4.2 3.8 - 4.8 g/dL   Globulin, Total 2.4 1.5 - 4.5 g/dL   Albumin/Globulin Ratio 1.8 1.2 - 2.2   Bilirubin Total <0.2 0.0 - 1.2 mg/dL   Alkaline  Phosphatase 82 44 - 121 IU/L   AST 19 0 - 40 IU/L   ALT 17 0 - 32 IU/L  Lipid Panel w/o Chol/HDL Ratio  Result Value Ref Range   Cholesterol, Total 230 (H) 100 - 199 mg/dL   Triglycerides 186 (H) 0 - 149 mg/dL   HDL 49 >39 mg/dL   VLDL Cholesterol Cal 34 5 - 40 mg/dL   LDL Chol Calc (NIH) 147 (H) 0 - 99 mg/dL  Urinalysis, Routine w reflex microscopic  Result Value Ref Range   Specific Gravity, UA >1.030 (H) 1.005 - 1.030   pH, UA 5.5 5.0 - 7.5   Color, UA Yellow Yellow   Appearance Ur Clear Clear   Leukocytes,UA Negative Negative   Protein,UA 1+ (A) Negative/Trace   Glucose, UA Negative Negative   Ketones, UA 1+ (A) Negative   RBC, UA Negative Negative   Bilirubin, UA Negative Negative   Urobilinogen, Ur 0.2 0.2 - 1.0 mg/dL   Nitrite, UA Negative Negative   Microscopic Examination See below:   TSH  Result Value Ref Range   TSH 0.376 (L) 0.450 - 4.500 uIU/mL  B12  Result Value Ref Range   Vitamin B-12 365  232 - 1,245 pg/mL  VITAMIN D 25 Hydroxy (Vit-D Deficiency, Fractures)  Result Value Ref Range   Vit D, 25-Hydroxy 65.3 30.0 - 100.0 ng/mL      Assessment & Plan:   Problem List Items Addressed This Visit       Endocrine   Hypothyroidism    Rechecking labs today. Await results. Treat as needed.       Relevant Orders   CBC with Differential/Platelet (Completed)   TSH (Completed)     Other   Hyperlipidemia    Under good control on current regimen. Continue current regimen. Continue to monitor. Call with any concerns. Refills given. Labs drawn today.       Relevant Medications   rosuvastatin (CRESTOR) 10 MG tablet   Other Relevant Orders   CBC with Differential/Platelet (Completed)   Comprehensive metabolic panel (Completed)   Lipid Panel w/o Chol/HDL Ratio (Completed)   Depression    Exacerbated. Concern for her thyroid being out of range. Will check labs. Await results. Recheck 2 weeks. Will also stop wellbutrin. Continue to monitor closely.      Relevant Medications   traZODone (DESYREL) 100 MG tablet   Other Relevant Orders   CBC with Differential/Platelet (Completed)   Anxiety    Exacerbated. Concern for her thyroid being out of range. Will check labs. Await results. Recheck 2 weeks. Will also stop wellbutrin. Continue to monitor closely.      Relevant Medications   traZODone (DESYREL) 100 MG tablet   Other Relevant Orders   CBC with Differential/Platelet (Completed)   Acute left ankle pain    Under good control on current regimen. Continue current regimen. Continue to monitor. Call with any concerns. Refills given.        Relevant Medications   cyclobenzaprine (FLEXERIL) 5 MG tablet   Insomnia    Under good control on current regimen. Continue current regimen. Continue to monitor. Call with any concerns. Refills given.        B12 deficiency    Rechecking labs today. Await results. Treat as needed.       Relevant Orders   B12 (Completed)   Vitamin D  deficiency    Rechecking labs today. Await results. Treat as needed.       Relevant Orders  VITAMIN D 25 Hydroxy (Vit-D Deficiency, Fractures) (Completed)   Advance directive discussed with patient    A voluntary discussion about advance care planning including the explanation and discussion of advance directives was extensively discussed  with the patient for 5 minutes with patient present.  Explanation about the health care proxy and Living will was reviewed and packet with forms with explanation of how to fill them out was given.  During this discussion, the patient was not able to identify a health care proxy and plans to fill out the paperwork required.  Patient was offered a separate [place] visit for further assistance with forms.         Other Visit Diagnoses     Encounter for annual wellness visit (AWV) in Medicare patient    -  Primary   Preventative care discussed today as below.   Routine general medical examination at a health care facility       Vaccines up to date. Screening labs checked today. Pap N/A. Mammo and DEXA ordered. Colonoscopy up to date. Continue diet and exercise. Call with any concerns.   Tobacco abuse       Relevant Orders   Urinalysis, Routine w reflex microscopic (Completed)   Weight loss       Will check labs today and stop wellbutrin. Await results. Treat as needed.   Relevant Orders   Fecal occult blood, imunochemical([facility])   Concussion with unknown loss of consciousness status, initial encounter       Relevant Orders   Ambulatory referral to Neurology   Dizziness       Checking labs today. Worse since concussion. Referral to neurology placed today.   Relevant Orders   Ambulatory referral to Neurology   Encounter for screening mammogram for malignant neoplasm of breast       Mammogram ordered today   Relevant Orders   MM 3D SCREEN BREAST BILATERAL   Screening for osteoporosis       DEXA ordered today   Relevant  Orders   DG Bone Density        Preventative Services:  Health Risk Assessment and Personalized Prevention Plan: Done today Bone Mass Measurements: Ordered today Breast Cancer Screening: Ordered today CVD Screening: Done today Cervical Cancer Screening: N/A Colon Cancer Screening: Up to date Depression Screening: Done today Diabetes Screening: Done today Glaucoma Screening: See your eye doctor Hepatitis B vaccine: N/A Hepatitis C screening: up to date HIV Screening: up to date Flu Vaccine: up to date Lung cancer Screening: N/A Obesity Screening: Done today Pneumonia Vaccines (2): Up to date STI Screening: N/A  Follow up plan: Return in about 2 weeks (around 08/13/2022).   LABORATORY TESTING:  - Pap smear: not applicable  IMMUNIZATIONS:   - Tdap: Tetanus vaccination status reviewed: last tetanus booster within 10 years. - Influenza: Up to date - Pneumovax: Up to date - Prevnar: Up to date - Zostavax vaccine:  Due for 2nd- go to pharmacy  SCREENING: -Mammogram: Ordered today  - Colonoscopy: Up to date  - Bone Density: Ordered today    PATIENT COUNSELING:   Advised to take 1 mg of folate supplement per day if capable of pregnancy.   Sexuality: Discussed sexually transmitted diseases, partner selection, use of condoms, avoidance of unintended pregnancy  and contraceptive alternatives.   Advised to avoid cigarette smoking.  I discussed with the patient that most people either abstain from alcohol or drink within safe limits (<=14/week and <=4 drinks/occasion for males, <=7/weeks and <=  3 drinks/occasion for females) and that the risk for alcohol disorders and other health effects rises proportionally with the number of drinks per week and how often a drinker exceeds daily limits.  Discussed cessation/primary prevention of drug use and availability of treatment for abuse.   Diet: Encouraged to adjust caloric intake to maintain  or achieve ideal body weight, to reduce  intake of dietary saturated fat and total fat, to limit sodium intake by avoiding high sodium foods and not adding table salt, and to maintain adequate dietary potassium and calcium preferably from fresh fruits, vegetables, and low-fat dairy products.    stressed the importance of regular exercise  Injury prevention: Discussed safety belts, safety helmets, smoke detector, smoking near bedding or upholstery.   Dental health: Discussed importance of regular tooth brushing, flossing, and dental visits.    NEXT PREVENTATIVE PHYSICAL DUE IN 1 YEAR. Return in about 2 weeks (around 08/13/2022).

## 2022-07-30 NOTE — Patient Instructions (Signed)
Preventative Services:  Health Risk Assessment and Personalized Prevention Plan: Done today Bone Mass Measurements: Ordered today Breast Cancer Screening: Ordered today CVD Screening: Done today Cervical Cancer Screening: N/A Colon Cancer Screening: Up to date Depression Screening: Done today Diabetes Screening: Done today Glaucoma Screening: See your eye doctor Hepatitis B vaccine: N/A Hepatitis C screening: up to date HIV Screening: up to date Flu Vaccine: up to date Lung cancer Screening: N/A Obesity Screening: Done today Pneumonia Vaccines (2): Up to date STI Screening: N/A  Please call to schedule your mammogram and bone density: [facility] [place]: [address], [place], [place] [address] Phone: [phone]  [facility] [address] [place],  [place]  [address] Phone: [phone]

## 2022-07-31 ENCOUNTER — Telehealth: Payer: Self-pay | Admitting: Family Medicine

## 2022-07-31 LAB — COMPREHENSIVE METABOLIC PANEL
ALT: 17 IU/L (ref 0–32)
AST: 19 IU/L (ref 0–40)
Albumin/Globulin Ratio: 1.8 (ref 1.2–2.2)
Albumin: 4.2 g/dL (ref 3.8–4.8)
Alkaline Phosphatase: 82 IU/L (ref 44–121)
BUN/Creatinine Ratio: 15 (ref 12–28)
BUN: 16 mg/dL (ref 8–27)
Bilirubin Total: <0.2 mg/dL (ref 0.0–1.2)
CO2: 27 mmol/L (ref 20–29)
Calcium: 9.3 mg/dL (ref 8.7–10.3)
Chloride: 104 mmol/L (ref 96–106)
Creatinine, Ser: 1.05 mg/dL — ABNORMAL HIGH (ref 0.57–1.00)
Globulin, Total: 2.4 g/dL (ref 1.5–4.5)
Glucose: 104 mg/dL — ABNORMAL HIGH (ref 70–99)
Potassium: 4.6 mmol/L (ref 3.5–5.2)
Sodium: 143 mmol/L (ref 134–144)
Total Protein: 6.6 g/dL (ref 6.0–8.5)
eGFR: 57 mL/min/{1.73_m2} — ABNORMAL LOW (ref >59)

## 2022-07-31 LAB — LIPID PANEL W/O CHOL/HDL RATIO
Cholesterol, Total: 230 mg/dL — ABNORMAL HIGH (ref 100–199)
HDL: 49 mg/dL (ref >39)
LDL Chol Calc (NIH): 147 mg/dL — ABNORMAL HIGH (ref 0–99)
Triglycerides: 186 mg/dL — ABNORMAL HIGH (ref 0–149)
VLDL Cholesterol Cal: 34 mg/dL (ref 5–40)

## 2022-07-31 LAB — CBC WITH DIFFERENTIAL/PLATELET
Basophils Absolute: 0 10*3/uL (ref 0.0–0.2)
Basos: 1 %
EOS (ABSOLUTE): 0.2 10*3/uL (ref 0.0–0.4)
Eos: 4 %
Hematocrit: 36.7 % (ref 34.0–46.6)
Hemoglobin: 12.3 g/dL (ref 11.1–15.9)
Immature Grans (Abs): 0 10*3/uL (ref 0.0–0.1)
Immature Granulocytes: 0 %
Lymphocytes Absolute: 1.9 10*3/uL (ref 0.7–3.1)
Lymphs: 39 %
MCH: 33.5 pg — ABNORMAL HIGH (ref 26.6–33.0)
MCHC: 33.5 g/dL (ref 31.5–35.7)
MCV: 100 fL — ABNORMAL HIGH (ref 79–97)
Monocytes Absolute: 0.3 10*3/uL (ref 0.1–0.9)
Monocytes: 6 %
Neutrophils Absolute: 2.4 10*3/uL (ref 1.4–7.0)
Neutrophils: 50 %
Platelets: 324 10*3/uL (ref 150–450)
RBC: 3.67 x10E6/uL — ABNORMAL LOW (ref 3.77–5.28)
RDW: 11.5 % — ABNORMAL LOW (ref 11.7–15.4)
WBC: 4.8 10*3/uL (ref 3.4–10.8)

## 2022-07-31 LAB — VITAMIN D 25 HYDROXY (VIT D DEFICIENCY, FRACTURES): Vit D, 25-Hydroxy: 65.3 ng/mL (ref 30.0–100.0)

## 2022-07-31 LAB — TSH: TSH: 0.376 u[IU]/mL — ABNORMAL LOW (ref 0.450–4.500)

## 2022-07-31 LAB — VITAMIN B12: Vitamin B-12: 365 pg/mL (ref 232–1245)

## 2022-07-31 NOTE — Telephone Encounter (Signed)
Copied from [place] [id number]. Topic: General - Other >> Jul 31, 2022  8:47 AM Ludger Nutting wrote: Patient states that the fecal occult kit that she was given did not have any cards in it. Patient also states she was supposed to be given a paper to be notarized for her will. Please follow up with patient.

## 2022-08-03 ENCOUNTER — Other Ambulatory Visit: Payer: Self-pay | Admitting: Family Medicine

## 2022-08-03 ENCOUNTER — Encounter: Payer: Self-pay | Admitting: Family Medicine

## 2022-08-03 DIAGNOSIS — E538 Deficiency of other specified B group vitamins: Secondary | ICD-10-CM | POA: Insufficient documentation

## 2022-08-03 DIAGNOSIS — R634 Abnormal weight loss: Secondary | ICD-10-CM | POA: Diagnosis not present

## 2022-08-03 DIAGNOSIS — Z7189 Other specified counseling: Secondary | ICD-10-CM | POA: Insufficient documentation

## 2022-08-03 DIAGNOSIS — E559 Vitamin D deficiency, unspecified: Secondary | ICD-10-CM | POA: Insufficient documentation

## 2022-08-03 DIAGNOSIS — E039 Hypothyroidism, unspecified: Secondary | ICD-10-CM

## 2022-08-03 MED ORDER — LEVOTHYROXINE SODIUM 25 MCG PO TABS: 12.5000 ug | ORAL_TABLET | Freq: Every day | ORAL | 3 refills | Status: DC

## 2022-08-03 NOTE — Assessment & Plan Note (Signed)
Rechecking labs today. Await results. Treat as needed.  °

## 2022-08-03 NOTE — Assessment & Plan Note (Signed)
Exacerbated. Concern for her thyroid being out of range. Will check labs. Await results. Recheck 2 weeks. Will also stop wellbutrin. Continue to monitor closely.

## 2022-08-03 NOTE — Assessment & Plan Note (Signed)
Under good control on current regimen. Continue current regimen. Continue to monitor. Call with any concerns. Refills given. Labs drawn today.   

## 2022-08-03 NOTE — Assessment & Plan Note (Signed)
Exacerbated. Concern for her thyroid being out of range. Will check labs. Await results. Recheck 2 weeks. Will also stop wellbutrin. Continue to monitor closely. 

## 2022-08-03 NOTE — Assessment & Plan Note (Signed)
A voluntary discussion about advance care planning including the explanation and discussion of advance directives was extensively discussed  with the patient for 5 minutes with patient present.  Explanation about the health care proxy and Living will was reviewed and packet with forms with explanation of how to fill them out was given.  During this discussion, the patient was not able to identify a health care proxy and plans to fill out the paperwork required.  Patient was offered a separate Advance Care Planning visit for further assistance with forms.    

## 2022-08-03 NOTE — Assessment & Plan Note (Signed)
Under good control on current regimen. Continue current regimen. Continue to monitor. Call with any concerns. Refills given.   

## 2022-08-03 NOTE — Telephone Encounter (Signed)
Made patient aware fecal occult kit does not come with cards, should just be a tube. Patient verbalized understanding.  Please advise in regards to notarized papers for will, patient has upcoming appointment on 11/16. 

## 2022-08-05 LAB — FECAL OCCULT BLOOD, IMMUNOCHEMICAL: Fecal Occult Bld: NEGATIVE

## 2022-08-13 ENCOUNTER — Ambulatory Visit: Payer: Medicare Other | Admitting: Family Medicine

## 2022-08-14 ENCOUNTER — Other Ambulatory Visit: Payer: Self-pay | Admitting: Family Medicine

## 2022-08-14 NOTE — Telephone Encounter (Signed)
Requested medication (s) are due for refill today:   Provider to review  Requested medication (s) are on the active medication list:   Yes  Future visit scheduled:   Yes   Last ordered: 05/11/2022 #60, 1 refill  Non delegated refill   Requested Prescriptions  Pending Prescriptions Disp Refills   ondansetron (ZOFRAN) 4 MG tablet [Pharmacy Med Name: ONDANSETRON HCL 4 MG TABLET] 60 tablet 1    Sig: TAKE 1 TABLET AS NEEDED FOR NAUSEA UPTO 2 TIMES A DAY     Not Delegated - Gastroenterology: Antiemetics - ondansetron Failed - 08/14/2022  1:19 AM      Failed - This refill cannot be delegated      Passed - AST in normal range and within 360 days    AST  Date Value Ref Range Status  07/30/2022 19 0 - 40 IU/L Final   AST (SGOT) Piccolo, Waived  Date Value Ref Range Status  11/12/2016 19 11 - 38 U/L Final         Passed - ALT in normal range and within 360 days    ALT  Date Value Ref Range Status  07/30/2022 17 0 - 32 IU/L Final   ALT (SGPT) Piccolo, Waived  Date Value Ref Range Status  11/12/2016 15 10 - 47 U/L Final         Passed - Valid encounter within last 6 months    Recent Outpatient Visits           2 weeks ago Encounter for annual wellness visit (AWV) in Medicare patient   [facility], Megan P, DO   5 months ago Concussion with unknown loss of consciousness status, subsequent encounter   [employer], Megan P, DO   6 months ago Concussion with unknown loss of consciousness status, initial encounter   [facility] [employer], Megan P, DO   8 months ago Right arm pain   [facility] [place], [place], DO   1 year ago COVID-19   [employer], [place], DO       Future Appointments             In 1 week Laural Benes, Oralia Rud, DO [facility], [facility]

## 2022-08-21 ENCOUNTER — Other Ambulatory Visit: Payer: Self-pay | Admitting: Family Medicine

## 2022-08-24 NOTE — Telephone Encounter (Signed)
Requested medication (s) are due for refill today: yes  Requested medication (s) are on the active medication list: yes  Last refill:  07/29/22  Future visit scheduled: yes  Notes to clinic:  Unable to refill per protocol, cannot delegate.      Requested Prescriptions  Pending Prescriptions Disp Refills   QUEtiapine (SEROQUEL) 25 MG tablet [Pharmacy Med Name: QUETIAPINE FUMARATE 25 MG TAB] 90 tablet 1    Sig: TAKE 1 TABLET BY MOUTH EVERYDAY AT BEDTIME     Not Delegated - Psychiatry:  Antipsychotics - Second Generation (Atypical) - quetiapine Failed - 08/21/2022  1:30 PM      Failed - This refill cannot be delegated      Failed - TSH in normal range and within 360 days    TSH  Date Value Ref Range Status  07/30/2022 0.376 (L) 0.450 - 4.500 uIU/mL Final         Failed - Lipid Panel in normal range within the last 12 months    Cholesterol, Total  Date Value Ref Range Status  07/30/2022 230 (H) 100 - 199 mg/dL Final   Cholesterol Piccolo, Waived  Date Value Ref Range Status  11/12/2016 173 <200 mg/dL Final    Comment:                            Desirable                <200                         Borderline High      200- 239                         High                     >239    LDL Chol Calc (NIH)  Date Value Ref Range Status  07/30/2022 147 (H) 0 - 99 mg/dL Final   HDL  Date Value Ref Range Status  07/30/2022 49 >39 mg/dL Final   Triglycerides  Date Value Ref Range Status  07/30/2022 186 (H) 0 - 149 mg/dL Final   Triglycerides Piccolo,Waived  Date Value Ref Range Status  11/12/2016 210 (H) <150 mg/dL Final    Comment:                            Normal                   <150                         Borderline High     150 - 199                         High                200 - 499                         Very High                >499          Passed - Completed PHQ-2 or PHQ-9 in the last 360 days  Passed - Last BP in normal range    BP Readings  from Last 1 Encounters:  07/30/22 110/72         Passed - Last Heart Rate in normal range    Pulse Readings from Last 1 Encounters:  07/30/22 93         Passed - Valid encounter within last 6 months    Recent Outpatient Visits           3 weeks ago Encounter for annual wellness visit (AWV) in Medicare patient   [facility], Megan P, DO   6 months ago Concussion with unknown loss of consciousness status, subsequent encounter   [place], Megan P, DO   6 months ago Concussion with unknown loss of consciousness status, initial encounter   [employer], Megan P, DO   8 months ago Right arm pain   [place], Adrian, DO   1 year ago COVID-19   [employer], [place], DO       Future Appointments             Tomorrow Wynetta Emery, Megan P, DO [place] [facility], [facility]            Passed - CBC within normal limits and completed in the last 12 months    WBC  Date Value Ref Range Status  07/30/2022 4.8 3.4 - 10.8 x10E3/uL Final  07/18/2020 4.6 4.0 - 10.5 K/uL Final   RBC  Date Value Ref Range Status  07/30/2022 3.67 (L) 3.77 - 5.28 x10E6/uL Final  07/18/2020 3.40 (L) 3.87 - 5.11 MIL/uL Final   Hemoglobin  Date Value Ref Range Status  07/30/2022 12.3 11.1 - 15.9 g/dL Final   Hematocrit  Date Value Ref Range Status  07/30/2022 36.7 34.0 - 46.6 % Final   MCHC  Date Value Ref Range Status  07/30/2022 33.5 31.5 - 35.7 g/dL Final  07/18/2020 32.4 30.0 - 36.0 g/dL Final   [facility]  Date Value Ref Range Status  07/30/2022 33.5 (H) 26.6 - 33.0 pg Final  07/18/2020 32.9 26.0 - 34.0 pg Final   MCV  Date Value Ref Range Status  07/30/2022 100 (H) 79 - 97 fL Final   No results found for: "PLTCOUNTKUC", "LABPLAT", "POCPLA" RDW  Date Value Ref Range Status  07/30/2022 11.5 (L) 11.7 - 15.4 % Final         Passed - CMP within normal limits and completed in the last 12  months    Albumin  Date Value Ref Range Status  07/30/2022 4.2 3.8 - 4.8 g/dL Final   Alkaline Phosphatase  Date Value Ref Range Status  07/30/2022 82 44 - 121 IU/L Final   ALT  Date Value Ref Range Status  07/30/2022 17 0 - 32 IU/L Final   ALT (SGPT) Piccolo, Waived  Date Value Ref Range Status  11/12/2016 15 10 - 47 U/L Final   AST  Date Value Ref Range Status  07/30/2022 19 0 - 40 IU/L Final   AST (SGOT) Piccolo, Waived  Date Value Ref Range Status  11/12/2016 19 11 - 38 U/L Final   BUN  Date Value Ref Range Status  07/30/2022 16 8 - 27 mg/dL Final   Calcium  Date Value Ref Range Status  07/30/2022 9.3 8.7 - 10.3 mg/dL Final   CO2  Date Value Ref Range Status  07/30/2022 27 20 - 29 mmol/L Final   Bicarbonate  Date Value Ref Range Status  04/07/2018 20.1 20.0 - 28.0 mmol/L Corrected   Creatinine, Ser  Date Value Ref Range Status  07/30/2022 1.05 (H) 0.57 - 1.00 mg/dL Final   Glucose  Date Value Ref Range Status  07/30/2022 104 (H) 70 - 99 mg/dL Final   Glucose, Bld  Date Value Ref Range Status  07/18/2020 81 70 - 99 mg/dL Final    Comment:    Glucose reference range applies only to samples taken after fasting for at least 8 hours.   Glucose-Capillary  Date Value Ref Range Status  04/14/2018 84 70 - 99 mg/dL Final   Potassium  Date Value Ref Range Status  07/30/2022 4.6 3.5 - 5.2 mmol/L Final   Sodium  Date Value Ref Range Status  07/30/2022 143 134 - 144 mmol/L Final   Bilirubin Total  Date Value Ref Range Status  07/30/2022 <0.2 0.0 - 1.2 mg/dL Final   Bilirubin, Direct  Date Value Ref Range Status  04/05/2018 0.2 0.0 - 0.2 mg/dL Final    Comment:    Please note change in reference range.   Indirect Bilirubin  Date Value Ref Range Status  04/05/2018 0.4 0.3 - 0.9 mg/dL Final    Comment:    Performed at [facility], [address] [place], [place] [address]   Protein, ur  Date Value Ref Range Status  04/05/2018 30 (A)  NEGATIVE mg/dL Final   Protein,UA  Date Value Ref Range Status  07/30/2022 1+ (A) Negative/Trace Final   Total Protein  Date Value Ref Range Status  07/30/2022 6.6 6.0 - 8.5 g/dL Final   GFR calc Af Amer  Date Value Ref Range Status  11/24/2019 57 (L) >59 mL/min/1.73 Final   eGFR  Date Value Ref Range Status  07/30/2022 57 (L) >59 mL/min/1.73 Final   GFR, Estimated  Date Value Ref Range Status  07/18/2020 >60 >60 mL/min Final    Comment:    (NOTE) Calculated using the CKD-EPI Creatinine Equation (2021)

## 2022-08-25 ENCOUNTER — Ambulatory Visit: Payer: Medicare Other | Admitting: Family Medicine

## 2022-08-27 ENCOUNTER — Ambulatory Visit: Payer: Self-pay

## 2022-08-27 NOTE — Telephone Encounter (Signed)
Please let patient know that she will have to be seen to get medication.

## 2022-08-27 NOTE — Telephone Encounter (Signed)
Message from Kandis Cocking sent at 08/27/2022  1:43 PM EST  Summary: Fever, cough   The patient called in stating she has had a bad cough and fever since yesterday. She states her cough is productive with a yellow and green color. She has requested a possible antibiotic and cough medicine. She uses  [id number] - [place], [place] - [address] Phone: [phone] Fax: [phone]         Chief Complaint: cough, fever Symptoms: coughing up yellow phlegm aas well as yellow nasal discharge, fever to 99.4, soreness to left chest (non radiating), wheezing that clears with coughing  Frequency: 4 days Pertinent Negatives: Patient denies runny nose SOB Disposition: [] ED /[] Urgent Care (no appt availability in office) / [] Appointment(In office/virtual)/ []  [facility]/ [] Home Care/ [x] Refused Recommended Disposition /[]  Mobile Bus/ []  Follow-up with PCP Additional Notes: pt wanting abx called in. Advised pt that office visit or Urgent Care  is advised. Pt refused.   Reason for Disposition  [1] MILD difficulty breathing (e.g., minimal/no SOB at rest, SOB with walking, pulse <100) AND [2] still present when not coughing  Answer Assessment - Initial Assessment Questions 1. ONSET: "When did the cough begin?"      4 days 2. SEVERITY: "How bad is the cough today?"      Calling frequently 3. SPUTUM: "Describe the color of your sputum" (none, dry cough; clear, white, yellow, green)     Yellow green 4. HEMOPTYSIS: "Are you coughing up any blood?" If so ask: "How much?" (flecks, streaks, tablespoons, etc.)     no 5. DIFFICULTY BREATHING: "Are you having difficulty breathing?" If Yes, ask: "How bad is it?" (e.g., mild, moderate, severe)    - MILD: No SOB at rest, mild SOB with walking, speaks normally in sentences, can lie down, no retractions, pulse < 100.    - MODERATE: SOB at rest, SOB with minimal exertion and prefers to sit, cannot lie down flat, speaks in phrases,  mild retractions, audible wheezing, pulse 100-120.    - SEVERE: Very SOB at rest, speaks in single words, struggling to breathe, sitting hunched forward, retractions, pulse > 120     no 6. FEVER: "Do you have a fever?" If Yes, ask: "What is your temperature, how was it measured, and when did it start?"     yes 7. CARDIAC HISTORY: "Do you have any history of heart disease?" (e.g., heart attack, congestive heart failure)      *No Answer* 8. LUNG HISTORY: "Do you have any history of lung disease?"  (e.g., pulmonary embolus, asthma, emphysema)     no 9. PE RISK FACTORS: "Do you have a history of blood clots?" (or: recent major surgery, recent prolonged travel, bedridden)     N/a 10. OTHER SYMPTOMS: "Do you have any other symptoms?" (e.g., runny nose, wheezing, chest pain)       99.4 , left chest pain with coughing fits, hurts  11. PREGNANCY: "Is there any chance you are pregnant?" "When was your last menstrual period?"       N/a 12. TRAVEL: "Have you traveled out of the country in the last month?" (e.g., travel history, exposures)       N/a  Protocols used: Cough - Acute Productive-A-AH

## 2022-08-28 NOTE — Telephone Encounter (Signed)
Please call and schedule an appointment for patient. She will need to be seen in order to get an antibiotic.

## 2022-08-28 NOTE — Telephone Encounter (Signed)
Patient has appointment scheduled for Dec 8th @ 3:40 pm.

## 2022-09-04 ENCOUNTER — Encounter: Payer: Self-pay | Admitting: Family Medicine

## 2022-09-04 ENCOUNTER — Ambulatory Visit (INDEPENDENT_AMBULATORY_CARE_PROVIDER_SITE_OTHER): Payer: Medicare Other | Admitting: Family Medicine

## 2022-09-04 VITALS — BP 137/73 | HR 99 | Temp 98.5°F | Wt 95.1 lb

## 2022-09-04 DIAGNOSIS — R296 Repeated falls: Secondary | ICD-10-CM

## 2022-09-04 DIAGNOSIS — R0781 Pleurodynia: Secondary | ICD-10-CM

## 2022-09-04 DIAGNOSIS — F419 Anxiety disorder, unspecified: Secondary | ICD-10-CM | POA: Diagnosis not present

## 2022-09-04 DIAGNOSIS — R052 Subacute cough: Secondary | ICD-10-CM | POA: Diagnosis not present

## 2022-09-04 MED ORDER — QUETIAPINE FUMARATE 50 MG PO TABS: 50.0000 mg | ORAL_TABLET | Freq: Every day | ORAL | 3 refills | Status: DC

## 2022-09-04 NOTE — Assessment & Plan Note (Signed)
Doing much better on the seroquel, but still only sleeping 2-3 hours a night, thyroid was normal, ?hypomania- will increase seroquel to 50mg  and recheck 4-6 weeks.

## 2022-09-04 NOTE — Progress Notes (Signed)
BP 137/73   Pulse 99   Temp 98.5 F (36.9 C) (Oral)   Wt 95 lb 1.6 oz (43.1 kg)   SpO2 94%   BMI 17.68 kg/m    Subjective:    Patient ID: Annette Miller, female    DOB: Dec 24, 1950, 71 y.o.   MRN: [id number]  HPI: Annette Miller is a 71 y.o. female  Chief Complaint  Patient presents with   Depression   Fall    Pt states she had a fall recently, thinks she may have broke a few ribs    [address] about a month ago  Hit her ribs. Did not pass out. Has been having pain since. Notes that she has been falling a lot more recently. Balance has been bad.  DEPRESSION Mood status: better Satisfied with current treatment?: no Symptom severity: moderate  Duration of current treatment : months Side effects: no Medication compliance: excellent compliance Psychotherapy/counseling: no  Previous psychiatric medications: seroquel Depressed mood: yes Anxious mood: yes Anhedonia: no Significant weight loss or gain: no Insomnia: yes hard to fall asleep- still only getting 2-3 hours of sleep a night Fatigue: yes Feelings of worthlessness or guilt: no Impaired concentration/indecisiveness: no Suicidal ideations: no Hopelessness: no Crying spells: no    09/04/2022    3:37 PM 07/30/2022    2:02 PM 02/09/2022    2:00 PM 12/15/2021    8:25 AM 12/24/2020    1:08 PM  Depression screen PHQ 2/9  Decreased Interest 1 1 0 1 0  Down, Depressed, Hopeless 1 3 0 0 1  PHQ - 2 Score 2 4 0 1 1  Altered sleeping _0 Tired, decreased energy _1 Change in appetite _2 Feeling bad or failure about yourself  1 1 0 0 0  Trouble concentrating 1 2 0 0 0  Moving slowly or fidgety/restless 0 0 1 2 0  Suicidal thoughts 0 0 0 0 0  PHQ-9 Score _3 Difficult doing work/chores Somewhat difficult   Somewhat difficult Not difficult at all      09/04/2022    3:37 PM 07/30/2022    2:02 PM 02/09/2022    2:00 PM 12/15/2021    8:25 AM  GAD 7 : Generalized Anxiety Score  Nervous, Anxious,  on Edge _4 Control/stop worrying _5 Worry too much - different things _6 Trouble relaxing _7 Restless _8 Easily annoyed or irritable 1 1 0 2  Afraid - awful might happen 1 0 0 0  Total GAD 7 Score _9 Anxiety Difficulty Somewhat difficult  Not difficult at all Somewhat difficult    Relevant past medical, surgical, family and social history reviewed and updated as indicated. Interim medical history since our last visit reviewed. Allergies and medications reviewed and updated.  Review of Systems  Constitutional: Negative.   Respiratory:  Positive for cough. Negative for apnea, choking, chest tightness, shortness of breath, wheezing and stridor.   Cardiovascular: Negative.   Gastrointestinal: Negative.   Musculoskeletal: Negative.   Neurological: Negative.   Psychiatric/Behavioral: Negative.      Per HPI unless specifically indicated above     Objective:    BP 137/73   Pulse 99   Temp 98.5 F (36.9 C) (Oral)   Wt 95 lb 1.6 oz (  43.1 kg)   SpO2 94%   BMI 17.68 kg/m   Wt Readings from Last 3 Encounters:  09/04/22 95 lb 1.6 oz (43.1 kg)  07/30/22 91 lb 9.6 oz (41.5 kg)  02/16/22 101 lb 12.8 oz (46.2 kg)    Physical Exam Vitals and nursing note reviewed.  Constitutional:      General: She is not in acute distress.    Appearance: Normal appearance. She is underweight. She is not ill-appearing, toxic-appearing or diaphoretic.  HENT:     Head: Normocephalic and atraumatic.     Right Ear: External ear normal.     Left Ear: External ear normal.     Nose: Nose normal.     Mouth/Throat:     Mouth: Mucous membranes are moist.     Pharynx: Oropharynx is clear.  Eyes:     General: No scleral icterus.       Right eye: No discharge.        Left eye: No discharge.     Extraocular Movements: Extraocular movements intact.     Conjunctiva/sclera: Conjunctivae normal.     Pupils: Pupils are equal, round, and reactive to light.   Cardiovascular:     Rate and Rhythm: Normal rate and regular rhythm.     Pulses: Normal pulses.     Heart sounds: Normal heart sounds. No murmur heard.    No friction rub. No gallop.  Pulmonary:     Effort: Pulmonary effort is normal. No respiratory distress.     Breath sounds: Normal breath sounds. No stridor. No wheezing, rhonchi or rales.  Chest:     Chest wall: No tenderness.  Musculoskeletal:        General: Normal range of motion.     Cervical back: Normal range of motion and neck supple.  Skin:    General: Skin is warm and dry.     Capillary Refill: Capillary refill takes less than 2 seconds.     Coloration: Skin is not jaundiced or pale.     Findings: No bruising, erythema, lesion or rash.  Neurological:     General: No focal deficit present.     Mental Status: She is alert and oriented to person, place, and time. Mental status is at baseline.  Psychiatric:        Mood and Affect: Mood normal.        Behavior: Behavior normal.        Thought Content: Thought content normal.        Judgment: Judgment normal.     Results for orders placed or performed in visit on 07/30/22  Fecal occult blood, imunochemical([facility])   Specimen: Stool   ST  Result Value Ref Range   Fecal Occult Bld Negative Negative  Microscopic Examination   Urine  Result Value Ref Range   WBC, UA 0-5 0 - 5 /hpf   RBC, Urine 0-2 0 - 2 /hpf   Epithelial Cells (non renal) 0-10 0 - 10 /hpf   Bacteria, UA None seen None seen/Few  CBC with Differential/Platelet  Result Value Ref Range   WBC 4.8 3.4 - 10.8 x10E3/uL   RBC 3.67 (L) 3.77 - 5.28 x10E6/uL   Hemoglobin 12.3 11.1 - 15.9 g/dL   Hematocrit 36.7 34.0 - 46.6 %   MCV 100 (H) 79 - 97 fL   MCH 33.5 (H) 26.6 - 33.0 pg   MCHC 33.5 31.5 - 35.7 g/dL   RDW 11.5 (L) 11.7 - 15.4 %   Platelets 324  150 - 450 x10E3/uL   Neutrophils 50 Not Estab. %   Lymphs 39 Not Estab. %   Monocytes 6 Not Estab. %   Eos 4 Not Estab. %   Basos 1 Not Estab.  %   Neutrophils Absolute 2.4 1.4 - 7.0 x10E3/uL   Lymphocytes Absolute 1.9 0.7 - 3.1 x10E3/uL   Monocytes Absolute 0.3 0.1 - 0.9 x10E3/uL   EOS (ABSOLUTE) 0.2 0.0 - 0.4 x10E3/uL   Basophils Absolute 0.0 0.0 - 0.2 x10E3/uL   Immature Granulocytes 0 Not Estab. %   Immature Grans (Abs) 0.0 0.0 - 0.1 x10E3/uL  Comprehensive metabolic panel  Result Value Ref Range   Glucose 104 (H) 70 - 99 mg/dL   BUN 16 8 - 27 mg/dL   Creatinine, Ser 1.05 (H) 0.57 - 1.00 mg/dL   eGFR 57 (L) >59 mL/min/1.73   BUN/Creatinine Ratio 15 12 - 28   Sodium 143 134 - 144 mmol/L   Potassium 4.6 3.5 - 5.2 mmol/L   Chloride 104 96 - 106 mmol/L   CO2 27 20 - 29 mmol/L   Calcium 9.3 8.7 - 10.3 mg/dL   Total Protein 6.6 6.0 - 8.5 g/dL   Albumin 4.2 3.8 - 4.8 g/dL   Globulin, Total 2.4 1.5 - 4.5 g/dL   Albumin/Globulin Ratio 1.8 1.2 - 2.2   Bilirubin Total <0.2 0.0 - 1.2 mg/dL   Alkaline Phosphatase 82 44 - 121 IU/L   AST 19 0 - 40 IU/L   ALT 17 0 - 32 IU/L  Lipid Panel w/o Chol/HDL Ratio  Result Value Ref Range   Cholesterol, Total 230 (H) 100 - 199 mg/dL   Triglycerides 186 (H) 0 - 149 mg/dL   HDL 49 >39 mg/dL   VLDL Cholesterol Cal 34 5 - 40 mg/dL   LDL Chol Calc (NIH) 147 (H) 0 - 99 mg/dL  Urinalysis, Routine w reflex microscopic  Result Value Ref Range   Specific Gravity, UA >1.030 (H) 1.005 - 1.030   pH, UA 5.5 5.0 - 7.5   Color, UA Yellow Yellow   Appearance Ur Clear Clear   Leukocytes,UA Negative Negative   Protein,UA 1+ (A) Negative/Trace   Glucose, UA Negative Negative   Ketones, UA 1+ (A) Negative   RBC, UA Negative Negative   Bilirubin, UA Negative Negative   Urobilinogen, Ur 0.2 0.2 - 1.0 mg/dL   Nitrite, UA Negative Negative   Microscopic Examination See below:   TSH  Result Value Ref Range   TSH 0.376 (L) 0.450 - 4.500 uIU/mL  B12  Result Value Ref Range   Vitamin B-12 365 232 - 1,245 pg/mL  VITAMIN D 25 Hydroxy (Vit-D Deficiency, Fractures)  Result Value Ref Range   Vit D,  25-Hydroxy 65.3 30.0 - 100.0 ng/mL      Assessment & Plan:   Problem List Items Addressed This Visit       Other   Anxiety - Primary    Doing much better on the seroquel, but still only sleeping 2-3 hours a night, thyroid was normal, ?hypomania- will increase seroquel to 6m and recheck 4-6 weeks.       Other Visit Diagnoses     Rib pain       + Point tenderness- will check x-ray. Await results.   Relevant Orders   DG Ribs Unilateral Left   Frequent falls       Will get her PT to help with balance. Call with any concerns.   Relevant Orders  Ambulatory referral to Physical Therapy   Subacute cough       Will check CXR, likely due to rib injury. Await results.   Relevant Orders   DG Chest 2 View        Follow up plan: Return in about 6 weeks (around 10/16/2022) for virtual OK.

## 2022-09-18 ENCOUNTER — Other Ambulatory Visit: Payer: Medicare Other

## 2022-09-21 ENCOUNTER — Other Ambulatory Visit: Payer: Self-pay | Admitting: Family Medicine

## 2022-09-21 DIAGNOSIS — M25572 Pain in left ankle and joints of left foot: Secondary | ICD-10-CM

## 2022-09-22 NOTE — Telephone Encounter (Signed)
Requested medication (s) are due for refill today: yes  Requested medication (s) are on the active medication list: yes  Last refill:  07/30/22 #30 with 1 refill  Future visit scheduled: yes   Notes to clinic:  Please review for refill. Refill not delegated per protocol    Requested Prescriptions  Pending Prescriptions Disp Refills   cyclobenzaprine (FLEXERIL) 5 MG tablet [Pharmacy Med Name: CYCLOBENZAPRINE 5 MG TABLET] 30 tablet 1    Sig: TAKE 1 TABLET BY MOUTH EVERYDAY AT BEDTIME     Not Delegated - Analgesics:  Muscle Relaxants Failed - 09/21/2022  9:27 PM      Failed - This refill cannot be delegated      Passed - Valid encounter within last 6 months    Recent Outpatient Visits           2 weeks ago Anxiety   [facility] [place], Megan P, DO   1 month ago Encounter for annual wellness visit (AWV) in Medicare patient   [employer], [place], DO   7 months ago Concussion with unknown loss of consciousness status, subsequent encounter   [employer], Megan P, DO   7 months ago Concussion with unknown loss of consciousness status, initial encounter   [employer], Megan P, DO   9 months ago Right arm pain   [facility] [place], [place], DO       Future Appointments             In 3 weeks Laural Benes, Oralia Rud, DO [employer], [facility]

## 2022-10-03 ENCOUNTER — Other Ambulatory Visit: Payer: Self-pay | Admitting: Family Medicine

## 2022-10-05 NOTE — Telephone Encounter (Signed)
Requested medication (s) are due for refill today: yes  Requested medication (s) are on the active medication list: yes  Last refill:  08/16/22 #60 1 refills  Future visit scheduled: yes in 1 week  Notes to clinic:  not delegated per protocol. Do you want to refill Rx?     Requested Prescriptions  Pending Prescriptions Disp Refills   ondansetron (ZOFRAN) 4 MG tablet [Pharmacy Med Name: ONDANSETRON HCL 4 MG TABLET] 60 tablet 1    Sig: TAKE 1 TABLET AS NEEDED FOR NAUSEA UP TO 2 TIMES A DAY     Not Delegated - Gastroenterology: Antiemetics - ondansetron Failed - 10/03/2022  3:14 PM      Failed - This refill cannot be delegated      Passed - AST in normal range and within 360 days    AST  Date Value Ref Range Status  07/30/2022 19 0 - 40 IU/L Final   AST (SGOT) Piccolo, Waived  Date Value Ref Range Status  11/12/2016 19 11 - 38 U/L Final         Passed - ALT in normal range and within 360 days    ALT  Date Value Ref Range Status  07/30/2022 17 0 - 32 IU/L Final   ALT (SGPT) Piccolo, Waived  Date Value Ref Range Status  11/12/2016 15 10 - 47 U/L Final         Passed - Valid encounter within last 6 months    Recent Outpatient Visits           1 month ago [place], Megan P, DO   2 months ago Encounter for annual wellness visit (AWV) in Medicare patient   [employer], [place], DO   7 months ago Concussion with unknown loss of consciousness status, subsequent encounter   [employer], Megan P, DO   7 months ago Concussion with unknown loss of consciousness status, initial encounter   [employer], Megan P, DO   9 months ago Right arm pain   [place], [place], DO       Future Appointments             In 1 week Wynetta Emery, Barb Merino, DO [employer], [facility]

## 2022-10-05 NOTE — Telephone Encounter (Signed)
Requested medication (s) are due for refill today:requesting a 90 supply  Requested medication (s) are on the active medication list: yes  Last refill:  09/04/22  Future visit scheduled: yes  Notes to clinic:  Unable to refill per protocol, cannot delegate.      Requested Prescriptions  Pending Prescriptions Disp Refills   QUEtiapine (SEROQUEL) 50 MG tablet [Pharmacy Med Name: QUETIAPINE FUMARATE 50 MG TAB] 90 tablet 2    Sig: TAKE 1 TABLET BY MOUTH EVERYDAY AT BEDTIME     Not Delegated - Psychiatry:  Antipsychotics - Second Generation (Atypical) - quetiapine Failed - 10/03/2022 11:31 AM      Failed - This refill cannot be delegated      Failed - TSH in normal range and within 360 days    TSH  Date Value Ref Range Status  07/30/2022 0.376 (L) 0.450 - 4.500 uIU/mL Final         Failed - Lipid Panel in normal range within the last 12 months    Cholesterol, Total  Date Value Ref Range Status  07/30/2022 230 (H) 100 - 199 mg/dL Final   Cholesterol Piccolo, Waived  Date Value Ref Range Status  11/12/2016 173 <200 mg/dL Final    Comment:                            Desirable                <200                         Borderline High      200- 239                         High                     >239    LDL Chol Calc (NIH)  Date Value Ref Range Status  07/30/2022 147 (H) 0 - 99 mg/dL Final   HDL  Date Value Ref Range Status  07/30/2022 49 >39 mg/dL Final   Triglycerides  Date Value Ref Range Status  07/30/2022 186 (H) 0 - 149 mg/dL Final   Triglycerides Piccolo,Waived  Date Value Ref Range Status  11/12/2016 210 (H) <150 mg/dL Final    Comment:                            Normal                   <150                         Borderline High     150 - 199                         High                200 - 499                         Very High                >499          Passed - Completed PHQ-2 or PHQ-9 in the last 360 days  Passed - Last BP in normal range     BP Readings from Last 1 Encounters:  09/04/22 137/73         Passed - Last Heart Rate in normal range    Pulse Readings from Last 1 Encounters:  09/04/22 99         Passed - Valid encounter within last 6 months    Recent Outpatient Visits           1 month ago [place], Megan P, DO   2 months ago Encounter for annual wellness visit (AWV) in Medicare patient   [employer], Megan P, DO   7 months ago Concussion with unknown loss of consciousness status, subsequent encounter   [employer], Megan P, DO   7 months ago Concussion with unknown loss of consciousness status, initial encounter   [employer], Megan P, DO   9 months ago Right arm pain   [place], [place], DO       Future Appointments             In 1 week Wynetta Emery, Megan P, DO [place], [facility]            Passed - CBC within normal limits and completed in the last 12 months    WBC  Date Value Ref Range Status  07/30/2022 4.8 3.4 - 10.8 x10E3/uL Final  07/18/2020 4.6 4.0 - 10.5 K/uL Final   RBC  Date Value Ref Range Status  07/30/2022 3.67 (L) 3.77 - 5.28 x10E6/uL Final  07/18/2020 3.40 (L) 3.87 - 5.11 MIL/uL Final   Hemoglobin  Date Value Ref Range Status  07/30/2022 12.3 11.1 - 15.9 g/dL Final   Hematocrit  Date Value Ref Range Status  07/30/2022 36.7 34.0 - 46.6 % Final   MCHC  Date Value Ref Range Status  07/30/2022 33.5 31.5 - 35.7 g/dL Final  07/18/2020 32.4 30.0 - 36.0 g/dL Final   [facility]  Date Value Ref Range Status  07/30/2022 33.5 (H) 26.6 - 33.0 pg Final  07/18/2020 32.9 26.0 - 34.0 pg Final   MCV  Date Value Ref Range Status  07/30/2022 100 (H) 79 - 97 fL Final   No results found for: "PLTCOUNTKUC", "LABPLAT", "POCPLA" RDW  Date Value Ref Range Status  07/30/2022 11.5 (L) 11.7 - 15.4 % Final         Passed - CMP within normal limits and completed  in the last 12 months    Albumin  Date Value Ref Range Status  07/30/2022 4.2 3.8 - 4.8 g/dL Final   Alkaline Phosphatase  Date Value Ref Range Status  07/30/2022 82 44 - 121 IU/L Final   ALT  Date Value Ref Range Status  07/30/2022 17 0 - 32 IU/L Final   ALT (SGPT) Piccolo, Waived  Date Value Ref Range Status  11/12/2016 15 10 - 47 U/L Final   AST  Date Value Ref Range Status  07/30/2022 19 0 - 40 IU/L Final   AST (SGOT) Piccolo, Waived  Date Value Ref Range Status  11/12/2016 19 11 - 38 U/L Final   BUN  Date Value Ref Range Status  07/30/2022 16 8 - 27 mg/dL Final   Calcium  Date Value Ref Range Status  07/30/2022 9.3 8.7 - 10.3 mg/dL Final   CO2  Date Value Ref Range Status  07/30/2022 27 20 - 29 mmol/L Final  Bicarbonate  Date Value Ref Range Status  04/07/2018 20.1 20.0 - 28.0 mmol/L Corrected   Creatinine, Ser  Date Value Ref Range Status  07/30/2022 1.05 (H) 0.57 - 1.00 mg/dL Final   Glucose  Date Value Ref Range Status  07/30/2022 104 (H) 70 - 99 mg/dL Final   Glucose, Bld  Date Value Ref Range Status  07/18/2020 81 70 - 99 mg/dL Final    Comment:    Glucose reference range applies only to samples taken after fasting for at least 8 hours.   Glucose-Capillary  Date Value Ref Range Status  04/14/2018 84 70 - 99 mg/dL Final   Potassium  Date Value Ref Range Status  07/30/2022 4.6 3.5 - 5.2 mmol/L Final   Sodium  Date Value Ref Range Status  07/30/2022 143 134 - 144 mmol/L Final   Bilirubin Total  Date Value Ref Range Status  07/30/2022 <0.2 0.0 - 1.2 mg/dL Final   Bilirubin, Direct  Date Value Ref Range Status  04/05/2018 0.2 0.0 - 0.2 mg/dL Final    Comment:    Please note change in reference range.   Indirect Bilirubin  Date Value Ref Range Status  04/05/2018 0.4 0.3 - 0.9 mg/dL Final    Comment:    Performed at [facility], [address] [place], [place] [address]   Protein, ur  Date Value Ref Range Status   04/05/2018 30 (A) NEGATIVE mg/dL Final   Protein,UA  Date Value Ref Range Status  07/30/2022 1+ (A) Negative/Trace Final   Total Protein  Date Value Ref Range Status  07/30/2022 6.6 6.0 - 8.5 g/dL Final   GFR calc Af Amer  Date Value Ref Range Status  11/24/2019 57 (L) >59 mL/min/1.73 Final   eGFR  Date Value Ref Range Status  07/30/2022 57 (L) >59 mL/min/1.73 Final   GFR, Estimated  Date Value Ref Range Status  07/18/2020 >60 >60 mL/min Final    Comment:    (NOTE) Calculated using the CKD-EPI Creatinine Equation (2021)

## 2022-10-11 ENCOUNTER — Other Ambulatory Visit: Payer: Self-pay | Admitting: Family Medicine

## 2022-10-12 NOTE — Telephone Encounter (Signed)
Requested medication (s) are due for refill today:routing for review  Requested medication (s) are on the active medication list: yes  Last refill:  08/16/22  Future visit scheduled: yes  Notes to clinic:  Unable to refill per protocol, cannot delegate.      Requested Prescriptions  Pending Prescriptions Disp Refills   ondansetron (ZOFRAN) 4 MG tablet [Pharmacy Med Name: ONDANSETRON HCL 4 MG TABLET] 60 tablet 1    Sig: TAKE 1 TABLET AS NEEDED FOR NAUSEA UP TO 2 TIMES A DAY     Not Delegated - Gastroenterology: Antiemetics - ondansetron Failed - 10/11/2022  7:18 PM      Failed - This refill cannot be delegated      Passed - AST in normal range and within 360 days    AST  Date Value Ref Range Status  07/30/2022 19 0 - 40 IU/L Final   AST (SGOT) Piccolo, Waived  Date Value Ref Range Status  11/12/2016 19 11 - 38 U/L Final         Passed - ALT in normal range and within 360 days    ALT  Date Value Ref Range Status  07/30/2022 17 0 - 32 IU/L Final   ALT (SGPT) Piccolo, Waived  Date Value Ref Range Status  11/12/2016 15 10 - 47 U/L Final         Passed - Valid encounter within last 6 months    Recent Outpatient Visits           1 month ago [place], Megan P, DO   2 months ago Encounter for annual wellness visit (AWV) in Medicare patient   [employer], [place], DO   7 months ago Concussion with unknown loss of consciousness status, subsequent encounter   [employer], Megan P, DO   8 months ago Concussion with unknown loss of consciousness status, initial encounter   [employer], Megan P, DO   10 months ago Right arm pain   [place], [place], DO

## 2022-10-16 ENCOUNTER — Telehealth: Payer: Medicare Other | Admitting: Family Medicine

## 2022-11-02 ENCOUNTER — Other Ambulatory Visit: Payer: Self-pay | Admitting: Family Medicine

## 2022-11-03 NOTE — Telephone Encounter (Signed)
Overdue for appt. Virtual OK

## 2022-11-03 NOTE — Telephone Encounter (Signed)
Requested medication (s) are due for refill today: no  Requested medication (s) are on the active medication list: yes  Last refill:  09/04/22 #30 3 RF  Future visit scheduled: no  Notes to clinic:  med not delegated to NT to RF -TSH outside of normal range   Requested Prescriptions  Pending Prescriptions Disp Refills   QUEtiapine (SEROQUEL) 50 MG tablet [Pharmacy Med Name: QUETIAPINE FUMARATE 50 MG TAB] 90 tablet 2    Sig: TAKE 1 TABLET BY MOUTH EVERYDAY AT BEDTIME     Not Delegated - Psychiatry:  Antipsychotics - Second Generation (Atypical) - quetiapine Failed - 11/02/2022 10:31 AM      Failed - This refill cannot be delegated      Failed - TSH in normal range and within 360 days    TSH  Date Value Ref Range Status  07/30/2022 0.376 (L) 0.450 - 4.500 uIU/mL Final         Failed - Lipid Panel in normal range within the last 12 months    Cholesterol, Total  Date Value Ref Range Status  07/30/2022 230 (H) 100 - 199 mg/dL Final   Cholesterol Piccolo, Waived  Date Value Ref Range Status  11/12/2016 173 <200 mg/dL Final    Comment:                            Desirable                <200                         Borderline High      200- 239                         High                     >239    LDL Chol Calc (NIH)  Date Value Ref Range Status  07/30/2022 147 (H) 0 - 99 mg/dL Final   HDL  Date Value Ref Range Status  07/30/2022 49 >39 mg/dL Final   Triglycerides  Date Value Ref Range Status  07/30/2022 186 (H) 0 - 149 mg/dL Final   Triglycerides Piccolo,Waived  Date Value Ref Range Status  11/12/2016 210 (H) <150 mg/dL Final    Comment:                            Normal                   <150                         Borderline High     150 - 199                         High                200 - 499                         Very High                >499          Passed - Completed PHQ-2 or PHQ-9 in the last 360  days      Passed - Last BP in normal range     BP Readings from Last 1 Encounters:  09/04/22 137/73         Passed - Last Heart Rate in normal range    Pulse Readings from Last 1 Encounters:  09/04/22 99         Passed - Valid encounter within last 6 months    Recent Outpatient Visits           2 months ago [place], Megan P, DO   3 months ago Encounter for annual wellness visit (AWV) in Medicare patient   [place], Megan P, DO   8 months ago Concussion with unknown loss of consciousness status, subsequent encounter   [place], Megan P, DO   8 months ago Concussion with unknown loss of consciousness status, initial encounter   [place], Megan P, DO   10 months ago Right arm pain   [facility] [place], Megan P, DO              Passed - CBC within normal limits and completed in the last 12 months    WBC  Date Value Ref Range Status  07/30/2022 4.8 3.4 - 10.8 x10E3/uL Final  07/18/2020 4.6 4.0 - 10.5 K/uL Final   RBC  Date Value Ref Range Status  07/30/2022 3.67 (L) 3.77 - 5.28 x10E6/uL Final  07/18/2020 3.40 (L) 3.87 - 5.11 MIL/uL Final   Hemoglobin  Date Value Ref Range Status  07/30/2022 12.3 11.1 - 15.9 g/dL Final   Hematocrit  Date Value Ref Range Status  07/30/2022 36.7 34.0 - 46.6 % Final   MCHC  Date Value Ref Range Status  07/30/2022 33.5 31.5 - 35.7 g/dL Final  07/18/2020 32.4 30.0 - 36.0 g/dL Final   [facility]  Date Value Ref Range Status  07/30/2022 33.5 (H) 26.6 - 33.0 pg Final  07/18/2020 32.9 26.0 - 34.0 pg Final   MCV  Date Value Ref Range Status  07/30/2022 100 (H) 79 - 97 fL Final   No results found for: "PLTCOUNTKUC", "LABPLAT", "POCPLA" RDW  Date Value Ref Range Status  07/30/2022 11.5 (L) 11.7 - 15.4 % Final         Passed - CMP within normal limits and completed in the last 12 months    Albumin  Date  Value Ref Range Status  07/30/2022 4.2 3.8 - 4.8 g/dL Final   Alkaline Phosphatase  Date Value Ref Range Status  07/30/2022 82 44 - 121 IU/L Final   ALT  Date Value Ref Range Status  07/30/2022 17 0 - 32 IU/L Final   ALT (SGPT) Piccolo, Waived  Date Value Ref Range Status  11/12/2016 15 10 - 47 U/L Final   AST  Date Value Ref Range Status  07/30/2022 19 0 - 40 IU/L Final   AST (SGOT) Piccolo, Waived  Date Value Ref Range Status  11/12/2016 19 11 - 38 U/L Final   BUN  Date Value Ref Range Status  07/30/2022 16 8 - 27 mg/dL Final   Calcium  Date Value Ref Range Status  07/30/2022 9.3 8.7 - 10.3 mg/dL Final   CO2  Date Value Ref Range Status  07/30/2022 27 20 - 29 mmol/L Final   Bicarbonate  Date Value Ref Range Status  04/07/2018 20.1 20.0 - 28.0  mmol/L Corrected   Creatinine, Ser  Date Value Ref Range Status  07/30/2022 1.05 (H) 0.57 - 1.00 mg/dL Final   Glucose  Date Value Ref Range Status  07/30/2022 104 (H) 70 - 99 mg/dL Final   Glucose, Bld  Date Value Ref Range Status  07/18/2020 81 70 - 99 mg/dL Final    Comment:    Glucose reference range applies only to samples taken after fasting for at least 8 hours.   Glucose-Capillary  Date Value Ref Range Status  04/14/2018 84 70 - 99 mg/dL Final   Potassium  Date Value Ref Range Status  07/30/2022 4.6 3.5 - 5.2 mmol/L Final   Sodium  Date Value Ref Range Status  07/30/2022 143 134 - 144 mmol/L Final   Bilirubin Total  Date Value Ref Range Status  07/30/2022 <0.2 0.0 - 1.2 mg/dL Final   Bilirubin, Direct  Date Value Ref Range Status  04/05/2018 0.2 0.0 - 0.2 mg/dL Final    Comment:    Please note change in reference range.   Indirect Bilirubin  Date Value Ref Range Status  04/05/2018 0.4 0.3 - 0.9 mg/dL Final    Comment:    Performed at [facility], [address] [place], [place] [address]   Protein, ur  Date Value Ref Range Status  04/05/2018 30 (A) NEGATIVE mg/dL Final    Protein,UA  Date Value Ref Range Status  07/30/2022 1+ (A) Negative/Trace Final   Total Protein  Date Value Ref Range Status  07/30/2022 6.6 6.0 - 8.5 g/dL Final   GFR calc Af Amer  Date Value Ref Range Status  11/24/2019 57 (L) >59 mL/min/1.73 Final   eGFR  Date Value Ref Range Status  07/30/2022 57 (L) >59 mL/min/1.73 Final   GFR, Estimated  Date Value Ref Range Status  07/18/2020 >60 >60 mL/min Final    Comment:    (NOTE) Calculated using the CKD-EPI Creatinine Equation (2021)

## 2022-11-03 NOTE — Telephone Encounter (Signed)
Called patient to schedule an appointment. Her mailbox was full

## 2022-11-04 NOTE — Telephone Encounter (Signed)
Pt scheduled 2/9

## 2022-11-06 ENCOUNTER — Ambulatory Visit: Payer: Medicare HMO | Admitting: Family Medicine

## 2022-11-06 ENCOUNTER — Other Ambulatory Visit: Payer: Self-pay | Admitting: Family Medicine

## 2022-11-06 NOTE — Telephone Encounter (Signed)
Unable to refill per protocol, Rx request is too soon 07/30/22 for 90 days and 1 refill.  Requested Prescriptions  Pending Prescriptions Disp Refills   traZODone (DESYREL) 100 MG tablet [Pharmacy Med Name: TRAZODONE 100 MG TABLET] 135 tablet 1    Sig: TAKE 1-1.5 TABLETS (100-150 MG TOTAL) BY MOUTH AT BEDTIME AS NEEDED. FOR SLEEP     Psychiatry: Antidepressants - Serotonin Modulator Passed - 11/06/2022  2:23 PM      Passed - Completed PHQ-2 or PHQ-9 in the last 360 days      Passed - Valid encounter within last 6 months    Recent Outpatient Visits           2 months ago [place], [place], DO   3 months ago Encounter for annual wellness visit (AWV) in Medicare patient   [place] P, DO   8 months ago Concussion with unknown loss of consciousness status, subsequent encounter   [place], Megan P, DO   9 months ago Concussion with unknown loss of consciousness status, initial encounter   [place], Megan P, DO   10 months ago Right arm pain   [place], [place], [place]

## 2022-12-05 ENCOUNTER — Other Ambulatory Visit: Payer: Self-pay | Admitting: Nurse Practitioner

## 2022-12-05 ENCOUNTER — Other Ambulatory Visit: Payer: Self-pay | Admitting: Family Medicine

## 2022-12-05 DIAGNOSIS — M25572 Pain in left ankle and joints of left foot: Secondary | ICD-10-CM

## 2022-12-07 NOTE — Telephone Encounter (Signed)
Unable to refill per protocol, Rx requst is too soon. Last refill 07/30/22 for 90 days and 1 refill.  Requested Prescriptions  Pending Prescriptions Disp Refills   traZODone (DESYREL) 100 MG tablet [Pharmacy Med Name: TRAZODONE 100 MG TABLET] 135 tablet 1    Sig: TAKE 1-1.5 TABLETS (100-150 MG TOTAL) BY MOUTH AT BEDTIME AS NEEDED. FOR SLEEP     Psychiatry: Antidepressants - Serotonin Modulator Passed - 12/05/2022  1:03 AM      Passed - Completed PHQ-2 or PHQ-9 in the last 360 days      Passed - Valid encounter within last 6 months    Recent Outpatient Visits           3 months ago [place], [place], DO   4 months ago Encounter for annual wellness visit (AWV) in Medicare patient   [place], DO   9 months ago Concussion with unknown loss of consciousness status, subsequent encounter   [place], Megan P, DO   10 months ago Concussion with unknown loss of consciousness status, initial encounter   [place], Megan P, DO   11 months ago Right arm pain   [place], [place], [place]

## 2022-12-07 NOTE — Telephone Encounter (Signed)
Requested medication (s) are due for refill today: yes  Requested medication (s) are on the active medication list: yes  Last refill:  09/22/22  Future visit scheduled: yes  Notes to clinic:  Unable to refill per protocol, cannot delegate.      Requested Prescriptions  Pending Prescriptions Disp Refills   cyclobenzaprine (FLEXERIL) 5 MG tablet [Pharmacy Med Name: CYCLOBENZAPRINE 5 MG TABLET] 30 tablet 1    Sig: TAKE 1 TABLET BY MOUTH EVERYDAY AT BEDTIME     Not Delegated - Analgesics:  Muscle Relaxants Failed - 12/05/2022  1:03 AM      Failed - This refill cannot be delegated      Passed - Valid encounter within last 6 months    Recent Outpatient Visits           3 months ago [place], DO   4 months ago Encounter for annual wellness visit (AWV) in Medicare patient   [place], DO   9 months ago Concussion with unknown loss of consciousness status, subsequent encounter   [place], Megan P, DO   10 months ago Concussion with unknown loss of consciousness status, initial encounter   [place], Megan P, DO   11 months ago Right arm pain   [place], [place], DO

## 2022-12-11 ENCOUNTER — Other Ambulatory Visit: Payer: Self-pay | Admitting: Family Medicine

## 2022-12-12 ENCOUNTER — Telehealth: Payer: Medicare Other | Admitting: Family Medicine

## 2022-12-12 DIAGNOSIS — J069 Acute upper respiratory infection, unspecified: Secondary | ICD-10-CM

## 2022-12-12 MED ORDER — FLUTICASONE PROPIONATE 50 MCG/ACT NA SUSP: 2.0000 | Freq: Every day | NASAL | 6 refills | Status: DC

## 2022-12-12 MED ORDER — BENZONATATE 200 MG PO CAPS: 200.0000 mg | ORAL_CAPSULE | Freq: Two times a day (BID) | ORAL | 0 refills | Status: DC | PRN

## 2022-12-12 NOTE — Progress Notes (Signed)
E-Visit for Upper Respiratory Infection   We are sorry you are not feeling well.  Here is how we plan to help!  Based on what you have shared with me, it looks like you may have a viral upper respiratory infection.  Upper respiratory infections are caused by a large number of viruses; however, rhinovirus is the most common cause.   Symptoms vary from person to person, with common symptoms including sore throat, cough, fatigue or lack of energy and feeling of general discomfort.  A low-grade fever of up to 100.4 may present, but is often uncommon.  Symptoms vary however, and are closely related to a person's age or underlying illnesses.  The most common symptoms associated with an upper respiratory infection are nasal discharge or congestion, cough, sneezing, headache and pressure in the ears and face.  These symptoms usually persist for about 3 to 10 days, but can last up to 2 weeks.  It is important to know that upper respiratory infections do not cause serious illness or complications in most cases.    Upper respiratory infections can be transmitted from person to person, with the most common method of transmission being a person's hands.  The virus is able to live on the skin and can infect other persons for up to 2 hours after direct contact.  Also, these can be transmitted when someone coughs or sneezes; thus, it is important to cover the mouth to reduce this risk.  To keep the spread of the illness at bay, good hand hygiene is very important.  This is an infection that is most likely caused by a virus. There are no specific treatments other than to help you with the symptoms until the infection runs its course.  We are sorry you are not feeling well.  Here is how we plan to help!   For nasal congestion, you may use an oral decongestants such as Mucinex D or if you have glaucoma or high blood pressure use plain Mucinex.  Saline nasal spray or nasal drops can help and can safely be used as often as  needed for congestion.  For your congestion, I have prescribed Fluticasone nasal spray one spray in each nostril twice a day  If you do not have a history of heart disease, hypertension, diabetes or thyroid disease, prostate/bladder issues or glaucoma, you may also use Sudafed to treat nasal congestion.  It is highly recommended that you consult with a pharmacist or your primary care physician to ensure this medication is safe for you to take.     If you have a cough, you may use cough suppressants such as Delsym and Robitussin.  If you have glaucoma or high blood pressure, you can also use Coricidin HBP.   For cough I have prescribed for you A prescription cough medication called Tessalon Perles 100 mg. You may take 1-2 capsules every 8 hours as needed for cough  If you have a sore or scratchy throat, use a saltwater gargle-  to  teaspoon of salt dissolved in a 4-ounce to 8-ounce glass of warm water.  Gargle the solution for approximately 15-30 seconds and then spit.  It is important not to swallow the solution.  You can also use throat lozenges/cough drops and Chloraseptic spray to help with throat pain or discomfort.  Warm or cold liquids can also be helpful in relieving throat pain.  For headache, pain or general discomfort, you can use Ibuprofen or Tylenol as directed.   Some authorities believe   that zinc sprays or the use of Echinacea may shorten the course of your symptoms.   HOME CARE Only take medications as instructed by your medical team. Be sure to drink plenty of fluids. Water is fine as well as fruit juices, sodas and electrolyte beverages. You may want to stay away from caffeine or alcohol. If you are nauseated, try taking small sips of liquids. How do you know if you are getting enough fluid? Your urine should be a pale yellow or almost colorless. Get rest. Taking a steamy shower or using a humidifier may help nasal congestion and ease sore throat pain. You can place a towel over  your head and breathe in the steam from hot water coming from a faucet. Using a saline nasal spray works much the same way. Cough drops, hard candies and sore throat lozenges may ease your cough. Avoid close contacts especially the very young and the elderly Cover your mouth if you cough or sneeze Always remember to wash your hands.   GET HELP RIGHT AWAY IF: You develop worsening fever. If your symptoms do not improve within 10 days You develop yellow or green discharge from your nose over 3 days. You have coughing fits You develop a severe head ache or visual changes. You develop shortness of breath, difficulty breathing or start having chest pain Your symptoms persist after you have completed your treatment plan  MAKE SURE YOU  Understand these instructions. Will watch your condition. Will get help right away if you are not doing well or get worse.  Thank you for choosing an e-visit.  Your e-visit answers were reviewed by a board certified advanced clinical practitioner to complete your personal care plan. Depending upon the condition, your plan could have included both over the counter or prescription medications.  Please review your pharmacy choice. Make sure the pharmacy is open so you can pick up prescription now. If there is a problem, you may contact your provider through MyChart messaging and have the prescription routed to another pharmacy.  Your safety is important to us. If you have drug allergies check your prescription carefully.   For the next 24 hours you can use MyChart to ask questions about today's visit, request a non-urgent call back, or ask for a work or school excuse. You will get an email in the next two days asking about your experience. I hope that your e-visit has been valuable and will speed your recovery.   have provided 5 minutes of non face to face time during this encounter for chart review and documentation.     

## 2022-12-14 NOTE — Telephone Encounter (Signed)
Requested Prescriptions  Pending Prescriptions Disp Refills   traZODone (DESYREL) 100 MG tablet [Pharmacy Med Name: TRAZODONE 100 MG TABLET] 135 tablet 1    Sig: TAKE 1-1.5 TABLETS (100-150 MG TOTAL) BY MOUTH AT BEDTIME AS NEEDED. FOR SLEEP     Psychiatry: Antidepressants - Serotonin Modulator Passed - 12/11/2022  9:49 PM      Passed - Completed PHQ-2 or PHQ-9 in the last 360 days      Passed - Valid encounter within last 6 months    Recent Outpatient Visits           3 months ago [place], DO   4 months ago Encounter for annual wellness visit (AWV) in Medicare patient   [place], DO   10 months ago Concussion with unknown loss of consciousness status, subsequent encounter   [place], Megan P, DO   10 months ago Concussion with unknown loss of consciousness status, initial encounter   [place] P, DO   12 months ago Right arm pain   [place], [place], [place]

## 2023-01-06 ENCOUNTER — Other Ambulatory Visit: Payer: Self-pay | Admitting: Family Medicine

## 2023-01-06 NOTE — Telephone Encounter (Signed)
Unable to refill per protocol, Rx request is too soon. Last refill 08/03/22 for 90 and 3 refill.  Requested Prescriptions  Pending Prescriptions Disp Refills   levothyroxine (SYNTHROID) 25 MCG tablet [Pharmacy Med Name: LEVOTHYROXINE 25 MCG TABLET] 90 tablet 3    Sig: TAKE 1 TABLET BY MOUTH EVERY DAY BEFORE BREAKFAST     Endocrinology:  Hypothyroid Agents Failed - 01/06/2023  2:20 AM      Failed - TSH in normal range and within 360 days    TSH  Date Value Ref Range Status  07/30/2022 0.376 (L) 0.450 - 4.500 uIU/mL Final         Passed - Valid encounter within last 12 months    Recent Outpatient Visits           4 months ago Anxiety   [place] [facility] [place], Megan P, DO   5 months ago Encounter for annual wellness visit (AWV) in Medicare patient   [place] [facility] [place], Megan P, DO   10 months ago Concussion with unknown loss of consciousness status, subsequent encounter   [place] [facility] [place], Megan P, DO   11 months ago Concussion with unknown loss of consciousness status, initial encounter   [facility] [place], Megan P, DO   1 year ago Right arm pain   [place] [facility] [place], Megan P, DO               ondansetron (ZOFRAN) 4 MG tablet [Pharmacy Med Name: ONDANSETRON HCL 4 MG TABLET] 60 tablet 1    Sig: DISSOLVE 1 TABLET IN MOUTH AS NEEDED FOR NAUSEA UP TO 2 TIMES A DAY     Not Delegated - Gastroenterology: Antiemetics - ondansetron Failed - 01/06/2023  2:20 AM      Failed - This refill cannot be delegated      Passed - AST in normal range and within 360 days    AST  Date Value Ref Range Status  07/30/2022 19 0 - 40 IU/L Final   AST (SGOT) Piccolo, Waived  Date Value Ref Range Status  11/12/2016 19 11 - 38 U/L Final         Passed - ALT in normal range and within 360 days    ALT  Date Value Ref Range Status  07/30/2022 17 0 - 32 IU/L Final   ALT  (SGPT) Piccolo, Waived  Date Value Ref Range Status  11/12/2016 15 10 - 47 U/L Final         Passed - Valid encounter within last 6 months    Recent Outpatient Visits           4 months ago Anxiety   [place] [place], Megan P, DO   5 months ago Encounter for annual wellness visit (AWV) in Medicare patient   [place] [facility] [place], Megan P, DO   10 months ago Concussion with unknown loss of consciousness status, subsequent encounter   [place] [facility] [place], Megan P, DO   11 months ago Concussion with unknown loss of consciousness status, initial encounter   [place] [facility] [place], Megan P, DO   1 year ago Right arm pain   [place] [facility] [place], [place], DO

## 2023-01-06 NOTE — Telephone Encounter (Signed)
Requested medication (s) are due for refill today: yes  Requested medication (s) are on the active medication list: yes  Last refill:  10/12/22  Future visit scheduled: yes  Notes to clinic:  Unable to refill per protocol, cannot delegate.      Requested Prescriptions  Pending Prescriptions Disp Refills   ondansetron (ZOFRAN) 4 MG tablet [Pharmacy Med Name: ONDANSETRON HCL 4 MG TABLET] 60 tablet 1    Sig: DISSOLVE 1 TABLET IN MOUTH AS NEEDED FOR NAUSEA UP TO 2 TIMES A DAY     Not Delegated - Gastroenterology: Antiemetics - ondansetron Failed - 01/06/2023  2:20 AM      Failed - This refill cannot be delegated      Passed - AST in normal range and within 360 days    AST  Date Value Ref Range Status  07/30/2022 19 0 - 40 IU/L Final   AST (SGOT) Piccolo, Waived  Date Value Ref Range Status  11/12/2016 19 11 - 38 U/L Final         Passed - ALT in normal range and within 360 days    ALT  Date Value Ref Range Status  07/30/2022 17 0 - 32 IU/L Final   ALT (SGPT) Piccolo, Waived  Date Value Ref Range Status  11/12/2016 15 10 - 47 U/L Final         Passed - Valid encounter within last 6 months    Recent Outpatient Visits           4 months ago Anxiety   [place] [facility] [place], Megan P, DO   5 months ago Encounter for annual wellness visit (AWV) in Medicare patient   [place] [facility] [place], Megan P, DO   10 months ago Concussion with unknown loss of consciousness status, subsequent encounter   [place] [facility] [place], Megan P, DO   11 months ago Concussion with unknown loss of consciousness status, initial encounter   [place] [facility] [place], Megan P, DO   1 year ago Right arm pain   [place] [facility] [place], Megan P, DO              Refused Prescriptions Disp Refills   levothyroxine (SYNTHROID) 25 MCG tablet [Pharmacy Med Name: LEVOTHYROXINE 25 MCG TABLET] 90  tablet 3    Sig: TAKE 1 TABLET BY MOUTH EVERY DAY BEFORE BREAKFAST     Endocrinology:  Hypothyroid Agents Failed - 01/06/2023  2:20 AM      Failed - TSH in normal range and within 360 days    TSH  Date Value Ref Range Status  07/30/2022 0.376 (L) 0.450 - 4.500 uIU/mL Final         Passed - Valid encounter within last 12 months    Recent Outpatient Visits           4 months ago Anxiety   [place] [facility] [place], Megan P, DO   5 months ago Encounter for annual wellness visit (AWV) in Medicare patient   [place] [place], Megan P, DO   10 months ago Concussion with unknown loss of consciousness status, subsequent encounter   [place] [facility] [place], Megan P, DO   11 months ago Concussion with unknown loss of consciousness status, initial encounter   [place] [facility] [place], Megan P, DO   1 year ago Right arm pain     [facility] [place], [place], [place]

## 2023-01-07 ENCOUNTER — Other Ambulatory Visit: Payer: Self-pay | Admitting: Family Medicine

## 2023-01-07 DIAGNOSIS — M25572 Pain in left ankle and joints of left foot: Secondary | ICD-10-CM

## 2023-01-07 NOTE — Telephone Encounter (Signed)
Requested medication (s) are due for refill today: yes  Requested medication (s) are on the active medication list: yes  Last refill:  12/07/22  Future visit scheduled: yes  Notes to clinic:  Unable to refill per protocol, cannot delegate. PA required.      Requested Prescriptions  Pending Prescriptions Disp Refills   tiZANidine (ZANAFLEX) 2 MG tablet [Pharmacy Med Name: TIZANIDINE HCL 2 MG TABLET]  0     Not Delegated - Cardiovascular:  Alpha-2 Agonists - tizanidine Failed - 01/07/2023  9:20 AM      Failed - This refill cannot be delegated      Passed - Valid encounter within last 6 months    Recent Outpatient Visits           4 months ago Anxiety   [place] [facility] [place], Megan P, DO   5 months ago Encounter for annual wellness visit (AWV) in Medicare patient   [place] [facility] [place], Megan P, DO   10 months ago Concussion with unknown loss of consciousness status, subsequent encounter   [place] [facility] [place], Megan P, DO   11 months ago Concussion with unknown loss of consciousness status, initial encounter   [place] [facility] [place], Megan P, DO   1 year ago Right arm pain   [place] [facility] [place], [place], DO

## 2023-01-11 ENCOUNTER — Other Ambulatory Visit: Payer: Self-pay | Admitting: Family Medicine

## 2023-01-12 ENCOUNTER — Other Ambulatory Visit: Payer: Self-pay | Admitting: Family Medicine

## 2023-01-12 NOTE — Telephone Encounter (Signed)
Requested Prescriptions  Pending Prescriptions Disp Refills   QUEtiapine (SEROQUEL) 50 MG tablet [Pharmacy Med Name: QUETIAPINE FUMARATE 50 MG TAB] 30 tablet 3    Sig: TAKE 1 TABLET BY MOUTH EVERYDAY AT BEDTIME     Not Delegated - Psychiatry:  Antipsychotics - Second Generation (Atypical) - quetiapine Failed - 01/11/2023 11:43 PM      Failed - This refill cannot be delegated      Failed - TSH in normal range and within 360 days    TSH  Date Value Ref Range Status  07/30/2022 0.376 (L) 0.450 - 4.500 uIU/mL Final         Failed - Lipid Panel in normal range within the last 12 months    Cholesterol, Total  Date Value Ref Range Status  07/30/2022 230 (H) 100 - 199 mg/dL Final   Cholesterol Piccolo, Waived  Date Value Ref Range Status  11/12/2016 173 <200 mg/dL Final    Comment:                            Desirable                <200                         Borderline High      200- 239                         High                     >239    LDL Chol Calc (NIH)  Date Value Ref Range Status  07/30/2022 147 (H) 0 - 99 mg/dL Final   HDL  Date Value Ref Range Status  07/30/2022 49 >39 mg/dL Final   Triglycerides  Date Value Ref Range Status  07/30/2022 186 (H) 0 - 149 mg/dL Final   Triglycerides Piccolo,Waived  Date Value Ref Range Status  11/12/2016 210 (H) <150 mg/dL Final    Comment:                            Normal                   <150                         Borderline High     150 - 199                         High                200 - 499                         Very High                >499          Passed - Completed PHQ-2 or PHQ-9 in the last 360 days      Passed - Last BP in normal range    BP Readings from Last 1 Encounters:  09/04/22 137/73         Passed - Last Heart Rate in normal range    Pulse Readings from Last 1 Encounters:  09/04/22  99         Passed - Valid encounter within last 6 months    Recent Outpatient Visits           4  months ago Anxiety   [place] [facility] [place], Megan P, DO   5 months ago Encounter for annual wellness visit (AWV) in Medicare patient   [facility] [place], Megan P, DO   11 months ago Concussion with unknown loss of consciousness status, subsequent encounter   [place] [facility], Megan P, DO   11 months ago Concussion with unknown loss of consciousness status, initial encounter   [place] [facility] [place], [place] P, DO   1 year ago Right arm pain   [place] [facility] [place], Megan P, DO              Passed - CBC within normal limits and completed in the last 12 months    WBC  Date Value Ref Range Status  07/30/2022 4.8 3.4 - 10.8 x10E3/uL Final  07/18/2020 4.6 4.0 - 10.5 K/uL Final   RBC  Date Value Ref Range Status  07/30/2022 3.67 (L) 3.77 - 5.28 x10E6/uL Final  07/18/2020 3.40 (L) 3.87 - 5.11 MIL/uL Final   Hemoglobin  Date Value Ref Range Status  07/30/2022 12.3 11.1 - 15.9 g/dL Final   Hematocrit  Date Value Ref Range Status  07/30/2022 36.7 34.0 - 46.6 % Final   MCHC  Date Value Ref Range Status  07/30/2022 33.5 31.5 - 35.7 g/dL Final  16/06/9603 54.0 30.0 - 36.0 g/dL Final   [facility]  Date Value Ref Range Status  07/30/2022 33.5 (H) 26.6 - 33.0 pg Final  07/18/2020 32.9 26.0 - 34.0 pg Final   MCV  Date Value Ref Range Status  07/30/2022 100 (H) 79 - 97 fL Final   No results found for: "PLTCOUNTKUC", "LABPLAT", "POCPLA" RDW  Date Value Ref Range Status  07/30/2022 11.5 (L) 11.7 - 15.4 % Final         Passed - CMP within normal limits and completed in the last 12 months    Albumin  Date Value Ref Range Status  07/30/2022 4.2 3.8 - 4.8 g/dL Final   Alkaline Phosphatase  Date Value Ref Range Status  07/30/2022 82 44 - 121 IU/L Final   ALT  Date Value Ref Range Status  07/30/2022 17 0 - 32 IU/L Final   ALT (SGPT) Piccolo, Waived  Date Value  Ref Range Status  11/12/2016 15 10 - 47 U/L Final   AST  Date Value Ref Range Status  07/30/2022 19 0 - 40 IU/L Final   AST (SGOT) Piccolo, Waived  Date Value Ref Range Status  11/12/2016 19 11 - 38 U/L Final   BUN  Date Value Ref Range Status  07/30/2022 16 8 - 27 mg/dL Final   Calcium  Date Value Ref Range Status  07/30/2022 9.3 8.7 - 10.3 mg/dL Final   CO2  Date Value Ref Range Status  07/30/2022 27 20 - 29 mmol/L Final   Bicarbonate  Date Value Ref Range Status  04/07/2018 20.1 20.0 - 28.0 mmol/L Corrected   Creatinine, Ser  Date Value Ref Range Status  07/30/2022 1.05 (H) 0.57 - 1.00 mg/dL Final   Glucose  Date Value Ref Range Status  07/30/2022 104 (H) 70 - 99 mg/dL Final   Glucose, Bld  Date Value Ref Range Status  07/18/2020 81  70 - 99 mg/dL Final    Comment:    Glucose reference range applies only to samples taken after fasting for at least 8 hours.   Glucose-Capillary  Date Value Ref Range Status  04/14/2018 84 70 - 99 mg/dL Final   Potassium  Date Value Ref Range Status  07/30/2022 4.6 3.5 - 5.2 mmol/L Final   Sodium  Date Value Ref Range Status  07/30/2022 143 134 - 144 mmol/L Final   Bilirubin Total  Date Value Ref Range Status  07/30/2022 <0.2 0.0 - 1.2 mg/dL Final   Bilirubin, Direct  Date Value Ref Range Status  04/05/2018 0.2 0.0 - 0.2 mg/dL Final    Comment:    Please note change in reference range.   Indirect Bilirubin  Date Value Ref Range Status  04/05/2018 0.4 0.3 - 0.9 mg/dL Final    Comment:    Performed at [facility], [address] [place], [place] [address]   Protein, ur  Date Value Ref Range Status  04/05/2018 30 (A) NEGATIVE mg/dL Final   Protein,UA  Date Value Ref Range Status  07/30/2022 1+ (A) Negative/Trace Final   Total Protein  Date Value Ref Range Status  07/30/2022 6.6 6.0 - 8.5 g/dL Final   GFR calc Af Amer  Date Value Ref Range Status  11/24/2019 57 (L) >59 mL/min/1.73 Final   eGFR   Date Value Ref Range Status  07/30/2022 57 (L) >59 mL/min/1.73 Final   GFR, Estimated  Date Value Ref Range Status  07/18/2020 >60 >60 mL/min Final    Comment:    (NOTE) Calculated using the CKD-EPI Creatinine Equation (2021)           fexofenadine (ALLEGRA) 180 MG tablet [Pharmacy Med Name: FEXOFENADINE HCL 180 MG TABLET] 90 tablet 2    Sig: TAKE 1 TABLET BY MOUTH EVERY DAY     Ear, Nose, and Throat:  Antihistamines Passed - 01/11/2023 11:43 PM      Passed - Valid encounter within last 12 months    Recent Outpatient Visits           4 months ago Anxiety   [place] [place], Megan P, DO   5 months ago Encounter for annual wellness visit (AWV) in Medicare patient   [place] [facility] [place], [place], DO   11 months ago Concussion with unknown loss of consciousness status, subsequent encounter   [place] [facility] [place], Megan P, DO   11 months ago Concussion with unknown loss of consciousness status, initial encounter   [place] [facility] [place], Megan P, DO   1 year ago Right arm pain   [place] [facility] [place], Megan P, DO               levothyroxine (SYNTHROID) 25 MCG tablet [Pharmacy Med Name: LEVOTHYROXINE 25 MCG TABLET] 90 tablet 3    Sig: TAKE 1 TABLET BY MOUTH EVERY DAY BEFORE BREAKFAST     Endocrinology:  Hypothyroid Agents Failed - 01/11/2023 11:43 PM      Failed - TSH in normal range and within 360 days    TSH  Date Value Ref Range Status  07/30/2022 0.376 (L) 0.450 - 4.500 uIU/mL Final         Passed - Valid encounter within last 12 months    Recent Outpatient Visits           4 months ago Anxiety   [place]  [facility] [place], Megan P, DO   5 months ago Encounter for annual wellness visit (AWV) in Medicare patient   [place] [facility] [place], [place], DO   11 months ago Concussion with unknown loss of  consciousness status, subsequent encounter   [place] [facility] [place], Megan P, DO   11 months ago Concussion with unknown loss of consciousness status, initial encounter   [place] [facility] [place], [place] P, DO   1 year ago Right arm pain   [place] [facility] [place], Megan P, DO               traZODone (DESYREL) 100 MG tablet [Pharmacy Med Name: TRAZODONE 100 MG TABLET] 135 tablet 0    Sig: TAKE 1-1.5 TABLETS (100-150 MG TOTAL) BY MOUTH AT BEDTIME AS NEEDED. FOR SLEEP     Psychiatry: Antidepressants - Serotonin Modulator Passed - 01/11/2023 11:43 PM      Passed - Completed PHQ-2 or PHQ-9 in the last 360 days      Passed - Valid encounter within last 6 months    Recent Outpatient Visits           4 months ago Anxiety   [place] [facility] [place], Megan P, DO   5 months ago Encounter for annual wellness visit (AWV) in Medicare patient   [place] [facility] [place], Megan P, DO   11 months ago Concussion with unknown loss of consciousness status, subsequent encounter   [place] [facility] [place], Megan P, DO   11 months ago Concussion with unknown loss of consciousness status, initial encounter   [place] [facility] [place], Megan P, DO   1 year ago Right arm pain   [place] [facility] [place], [place], DO

## 2023-01-12 NOTE — Telephone Encounter (Signed)
Medication Refill - Medication: albuterol (VENTOLIN HFA) 108 (90 Base) MCG/ACT inhale  Pt is all out of this  needs asap  Has the patient contacted their pharmacy? yes (Agent: If no, request that the patient contact the pharmacy for the refill. If patient does not wish to contact the pharmacy document the reason why and proceed with request.) (Agent: If yes, when and what did the pharmacy advise?)contact pcp  Preferred Pharmacy (with phone number or street name):  [id number] - [place], [place] - [address] Phone: [phone]  Fax: [phone]     Has the patient been seen for an appointment in the last year OR does the patient have an upcoming appointment? yes  Agent: Please be advised that RX refills may take up to 3 business days. We ask that you follow-up with your pharmacy.

## 2023-01-12 NOTE — Telephone Encounter (Signed)
Requested medications are due for refill today.  unsure  Requested medications are on the active medications list.  Yes - both  Last refill. Seroquel 09/04/2022 #30 3 rf, Levothyroxine 08/03/2022 #90 3 rf  Future visit scheduled.   no  Notes to clinic.  Seroquel refill is not delegated. Levothyroxine's Sig on the med list is different than the one on refill request.    Requested Prescriptions  Pending Prescriptions Disp Refills   QUEtiapine (SEROQUEL) 50 MG tablet [Pharmacy Med Name: QUETIAPINE FUMARATE 50 MG TAB] 30 tablet 3    Sig: TAKE 1 TABLET BY MOUTH EVERYDAY AT BEDTIME     Not Delegated - Psychiatry:  Antipsychotics - Second Generation (Atypical) - quetiapine Failed - 01/11/2023 11:43 PM      Failed - This refill cannot be delegated      Failed - TSH in normal range and within 360 days    TSH  Date Value Ref Range Status  07/30/2022 0.376 (L) 0.450 - 4.500 uIU/mL Final         Failed - Lipid Panel in normal range within the last 12 months    Cholesterol, Total  Date Value Ref Range Status  07/30/2022 230 (H) 100 - 199 mg/dL Final   Cholesterol Piccolo, Waived  Date Value Ref Range Status  11/12/2016 173 <200 mg/dL Final    Comment:                            Desirable                <200                         Borderline High      200- 239                         High                     >239    LDL Chol Calc (NIH)  Date Value Ref Range Status  07/30/2022 147 (H) 0 - 99 mg/dL Final   HDL  Date Value Ref Range Status  07/30/2022 49 >39 mg/dL Final   Triglycerides  Date Value Ref Range Status  07/30/2022 186 (H) 0 - 149 mg/dL Final   Triglycerides Piccolo,Waived  Date Value Ref Range Status  11/12/2016 210 (H) <150 mg/dL Final    Comment:                            Normal                   <150                         Borderline High     150 - 199                         High                200 - 499                         Very High                 >499  Passed - Completed PHQ-2 or PHQ-9 in the last 360 days      Passed - Last BP in normal range    BP Readings from Last 1 Encounters:  09/04/22 137/73         Passed - Last Heart Rate in normal range    Pulse Readings from Last 1 Encounters:  09/04/22 99         Passed - Valid encounter within last 6 months    Recent Outpatient Visits           4 months ago Anxiety   [place] [facility] [place], Megan P, DO   5 months ago Encounter for annual wellness visit (AWV) in Medicare patient   [place] [place], Megan P, DO   11 months ago Concussion with unknown loss of consciousness status, subsequent encounter   [place] [facility] [place], Megan P, DO   11 months ago Concussion with unknown loss of consciousness status, initial encounter   [place] [facility] [place], Megan P, DO   1 year ago Right arm pain   [place] [facility] [place], Megan P, DO              Passed - CBC within normal limits and completed in the last 12 months    WBC  Date Value Ref Range Status  07/30/2022 4.8 3.4 - 10.8 x10E3/uL Final  07/18/2020 4.6 4.0 - 10.5 K/uL Final   RBC  Date Value Ref Range Status  07/30/2022 3.67 (L) 3.77 - 5.28 x10E6/uL Final  07/18/2020 3.40 (L) 3.87 - 5.11 MIL/uL Final   Hemoglobin  Date Value Ref Range Status  07/30/2022 12.3 11.1 - 15.9 g/dL Final   Hematocrit  Date Value Ref Range Status  07/30/2022 36.7 34.0 - 46.6 % Final   MCHC  Date Value Ref Range Status  07/30/2022 33.5 31.5 - 35.7 g/dL Final  16/06/9603 54.0 30.0 - 36.0 g/dL Final   [facility]  Date Value Ref Range Status  07/30/2022 33.5 (H) 26.6 - 33.0 pg Final  07/18/2020 32.9 26.0 - 34.0 pg Final   MCV  Date Value Ref Range Status  07/30/2022 100 (H) 79 - 97 fL Final   No results found for: "PLTCOUNTKUC", "LABPLAT", "POCPLA" RDW  Date Value Ref Range Status  07/30/2022 11.5 (L) 11.7 -  15.4 % Final         Passed - CMP within normal limits and completed in the last 12 months    Albumin  Date Value Ref Range Status  07/30/2022 4.2 3.8 - 4.8 g/dL Final   Alkaline Phosphatase  Date Value Ref Range Status  07/30/2022 82 44 - 121 IU/L Final   ALT  Date Value Ref Range Status  07/30/2022 17 0 - 32 IU/L Final   ALT (SGPT) Piccolo, Waived  Date Value Ref Range Status  11/12/2016 15 10 - 47 U/L Final   AST  Date Value Ref Range Status  07/30/2022 19 0 - 40 IU/L Final   AST (SGOT) Piccolo, Waived  Date Value Ref Range Status  11/12/2016 19 11 - 38 U/L Final   BUN  Date Value Ref Range Status  07/30/2022 16 8 - 27 mg/dL Final   Calcium  Date Value Ref Range Status  07/30/2022 9.3 8.7 - 10.3 mg/dL Final   CO2  Date Value Ref Range Status  07/30/2022 27 20 - 29 mmol/L Final   Bicarbonate  Date Value  Ref Range Status  04/07/2018 20.1 20.0 - 28.0 mmol/L Corrected   Creatinine, Ser  Date Value Ref Range Status  07/30/2022 1.05 (H) 0.57 - 1.00 mg/dL Final   Glucose  Date Value Ref Range Status  07/30/2022 104 (H) 70 - 99 mg/dL Final   Glucose, Bld  Date Value Ref Range Status  07/18/2020 81 70 - 99 mg/dL Final    Comment:    Glucose reference range applies only to samples taken after fasting for at least 8 hours.   Glucose-Capillary  Date Value Ref Range Status  04/14/2018 84 70 - 99 mg/dL Final   Potassium  Date Value Ref Range Status  07/30/2022 4.6 3.5 - 5.2 mmol/L Final   Sodium  Date Value Ref Range Status  07/30/2022 143 134 - 144 mmol/L Final   Bilirubin Total  Date Value Ref Range Status  07/30/2022 <0.2 0.0 - 1.2 mg/dL Final   Bilirubin, Direct  Date Value Ref Range Status  04/05/2018 0.2 0.0 - 0.2 mg/dL Final    Comment:    Please note change in reference range.   Indirect Bilirubin  Date Value Ref Range Status  04/05/2018 0.4 0.3 - 0.9 mg/dL Final    Comment:    Performed at [facility], [address]  [place], [place] [address]   Protein, ur  Date Value Ref Range Status  04/05/2018 30 (A) NEGATIVE mg/dL Final   Protein,UA  Date Value Ref Range Status  07/30/2022 1+ (A) Negative/Trace Final   Total Protein  Date Value Ref Range Status  07/30/2022 6.6 6.0 - 8.5 g/dL Final   GFR calc Af Amer  Date Value Ref Range Status  11/24/2019 57 (L) >59 mL/min/1.73 Final   eGFR  Date Value Ref Range Status  07/30/2022 57 (L) >59 mL/min/1.73 Final   GFR, Estimated  Date Value Ref Range Status  07/18/2020 >60 >60 mL/min Final    Comment:    (NOTE) Calculated using the CKD-EPI Creatinine Equation (2021)           levothyroxine (SYNTHROID) 25 MCG tablet [Pharmacy Med Name: LEVOTHYROXINE 25 MCG TABLET] 90 tablet 3    Sig: TAKE 1 TABLET BY MOUTH EVERY DAY BEFORE BREAKFAST     Endocrinology:  Hypothyroid Agents Failed - 01/11/2023 11:43 PM      Failed - TSH in normal range and within 360 days    TSH  Date Value Ref Range Status  07/30/2022 0.376 (L) 0.450 - 4.500 uIU/mL Final         Passed - Valid encounter within last 12 months    Recent Outpatient Visits           4 months ago Anxiety   [place] [facility] [place], Megan P, DO   5 months ago Encounter for annual wellness visit (AWV) in Medicare patient   [place] [facility] [place], [place], DO   11 months ago Concussion with unknown loss of consciousness status, subsequent encounter   [place] [facility] [place], Megan P, DO   11 months ago Concussion with unknown loss of consciousness status, initial encounter   [place] [facility] [place], Megan P, DO   1 year ago Right arm pain   [place] [facility] [place], [place], DO              Signed Prescriptions Disp Refills   fexofenadine (ALLEGRA) 180 MG tablet 90 tablet 2    Sig: TAKE  1 TABLET BY MOUTH EVERY DAY     Ear, Nose, and Throat:  Antihistamines Passed - 01/11/2023 11:43 PM       Passed - Valid encounter within last 12 months    Recent Outpatient Visits           4 months ago Anxiety   [place] [facility] [place], Megan P, DO   5 months ago Encounter for annual wellness visit (AWV) in Medicare patient   [place] [facility] [place], Megan P, DO   11 months ago Concussion with unknown loss of consciousness status, subsequent encounter   [place] [facility], Megan P, DO   11 months ago Concussion with unknown loss of consciousness status, initial encounter   [place] [facility] [place], Megan P, DO   1 year ago Right arm pain   [place] [facility] [place], Megan P, DO               traZODone (DESYREL) 100 MG tablet 135 tablet 0    Sig: TAKE 1-1.5 TABLETS (100-150 MG TOTAL) BY MOUTH AT BEDTIME AS NEEDED. FOR SLEEP     Psychiatry: Antidepressants - Serotonin Modulator Passed - 01/11/2023 11:43 PM      Passed - Completed PHQ-2 or PHQ-9 in the last 360 days      Passed - Valid encounter within last 6 months    Recent Outpatient Visits           4 months ago Anxiety   [place] [facility] [place], Megan P, DO   5 months ago Encounter for annual wellness visit (AWV) in Medicare patient   [place] [facility] [place], Megan P, DO   11 months ago Concussion with unknown loss of consciousness status, subsequent encounter   [place] [facility] [place], Megan P, DO   11 months ago Concussion with unknown loss of consciousness status, initial encounter   [place] [facility] [place], Megan P, DO   1 year ago Right arm pain    [place] [place], [place], DO

## 2023-01-12 NOTE — Telephone Encounter (Signed)
Patient is overdue for a follow up per Dr. Henriette Combs last note. Please call to schedule then route to provider for refill.

## 2023-01-13 MED ORDER — ALBUTEROL SULFATE HFA 108 (90 BASE) MCG/ACT IN AERS: 2.0000 | INHALATION_SPRAY | Freq: Four times a day (QID) | RESPIRATORY_TRACT | 0 refills | Status: DC | PRN

## 2023-01-13 NOTE — Telephone Encounter (Signed)
Pt was called and scheduled for 01/21/2023 @ 11:20

## 2023-01-13 NOTE — Telephone Encounter (Signed)
Requested Prescriptions  Pending Prescriptions Disp Refills   albuterol (VENTOLIN HFA) 108 (90 Base) MCG/ACT inhaler 18 g 0    Sig: Inhale 2 puffs into the lungs every 6 (six) hours as needed for wheezing or shortness of breath.     Pulmonology:  Beta Agonists 2 Passed - 01/12/2023 11:11 AM      Passed - Last BP in normal range    BP Readings from Last 1 Encounters:  09/04/22 137/73         Passed - Last Heart Rate in normal range    Pulse Readings from Last 1 Encounters:  09/04/22 99         Passed - Valid encounter within last 12 months    Recent Outpatient Visits           4 months ago Anxiety   [place] [facility] [place], Megan P, DO   5 months ago Encounter for annual wellness visit (AWV) in Medicare patient   [place] [facility] [place], Megan P, DO   11 months ago Concussion with unknown loss of consciousness status, subsequent encounter   [place] [facility] [place], Megan P, DO   11 months ago Concussion with unknown loss of consciousness status, initial encounter   [place] [facility] [place], Megan P, DO   1 year ago Right arm pain   [place] [facility] [place], Oralia Rud, DO       Future Appointments             In 1 week [place], Oralia Rud, DO [place] [facility], [facility]

## 2023-01-13 NOTE — Telephone Encounter (Signed)
Does she have enough to make it to next week? 

## 2023-01-13 NOTE — Telephone Encounter (Signed)
Called patient to verify if she had enough medication until her scheduled appointment. Unable to leave message for patient due to voicemail being full.   OK for [facility] to gather clarification if patient calls back.

## 2023-01-14 NOTE — Telephone Encounter (Signed)
Called and spoke to patient. She states she will need the Seroquel before the appointment next week.

## 2023-01-15 MED ORDER — QUETIAPINE FUMARATE 50 MG PO TABS: 50.0000 mg | ORAL_TABLET | Freq: Every day | ORAL | 0 refills | Status: DC

## 2023-01-15 NOTE — Addendum Note (Signed)
Addended by: Dorcas Carrow on: 01/15/2023 08:39 AM   Modules accepted: Orders

## 2023-01-21 ENCOUNTER — Ambulatory Visit: Payer: Medicare HMO | Admitting: Family Medicine

## 2023-02-01 ENCOUNTER — Ambulatory Visit (INDEPENDENT_AMBULATORY_CARE_PROVIDER_SITE_OTHER): Payer: Medicare HMO | Admitting: Family Medicine

## 2023-02-01 VITALS — BP 126/82 | HR 84 | Temp 98.2°F | Wt 99.2 lb

## 2023-02-01 DIAGNOSIS — M25511 Pain in right shoulder: Secondary | ICD-10-CM

## 2023-02-01 DIAGNOSIS — R5383 Other fatigue: Secondary | ICD-10-CM

## 2023-02-01 DIAGNOSIS — G8929 Other chronic pain: Secondary | ICD-10-CM | POA: Diagnosis not present

## 2023-02-01 DIAGNOSIS — F3342 Major depressive disorder, recurrent, in full remission: Secondary | ICD-10-CM

## 2023-02-01 DIAGNOSIS — E782 Mixed hyperlipidemia: Secondary | ICD-10-CM | POA: Diagnosis not present

## 2023-02-01 DIAGNOSIS — F32A Depression, unspecified: Secondary | ICD-10-CM | POA: Diagnosis not present

## 2023-02-01 DIAGNOSIS — T7840XD Allergy, unspecified, subsequent encounter: Secondary | ICD-10-CM

## 2023-02-01 DIAGNOSIS — E559 Vitamin D deficiency, unspecified: Secondary | ICD-10-CM | POA: Diagnosis not present

## 2023-02-01 MED ORDER — QUETIAPINE FUMARATE 50 MG PO TABS
75.0000 mg | ORAL_TABLET | Freq: Every day | ORAL | 0 refills | Status: DC
Start: 2023-02-01 — End: 2023-02-16

## 2023-02-01 MED ORDER — BENZONATATE 200 MG PO CAPS
200.0000 mg | ORAL_CAPSULE | Freq: Two times a day (BID) | ORAL | 0 refills | Status: DC | PRN
Start: 2023-02-01 — End: 2023-03-08

## 2023-02-01 NOTE — Assessment & Plan Note (Signed)
Chronic, unstable. Lipid panel done today. Continue taking Crestor 10mg  daily, will increase dose based off lipid levels. F/u in 3 months.

## 2023-02-01 NOTE — Assessment & Plan Note (Signed)
Chronic, unstable. PHQ-9-15, GAD-7-16. Will increase seroquel to 75mg  nightly. F/u in 1 month.

## 2023-02-01 NOTE — Progress Notes (Signed)
BP 126/82   Pulse 84   Temp 98.2 F (36.8 C) (Oral)   Wt 99 lb 3.2 oz (45 kg)   SpO2 98%   BMI 18.44 kg/m    Subjective:    Patient ID: Annette Miller, female    DOB: 01-May-1951, 72 y.o.   MRN: [id number]  HPI: Annette Miller is a 72 y.o. female  Chief Complaint  Patient presents with   Anxiety   Hyperlipidemia    ANXIETY/STRESS She is taking Seroquel 50 mg daily. She admits to 4-5 hours of sleep nightly and is taking Trazodone (150 mg)1.5 tablet nightly, She is a travel nurse, working day shift.  Duration:worse Anxious mood: yes  Excessive worrying: yes Irritability:  Depends on how family is making her feel   Sweating: no Nausea: no Palpitations:no Hyperventilation: no Panic attacks: no Agoraphobia: no  Obscessions/compulsions: no Depressed mood: yes    02/14/23    3:44 PM 09/04/2022    3:37 PM 07/30/2022    2:02 PM 02/09/2022    2:00 PM 12/15/2021    8:25 AM  Depression screen PHQ 2/9  Decreased Interest 1 1 1  0 1  Down, Depressed, Hopeless 2 1 3  0 0  PHQ - 2 Score 3 2 4  0 1  Altered sleeping 3 2 3 3 3   Tired, decreased energy 3 1 2 2 2   Change in appetite 2 2 3 1 2   Feeling bad or failure about yourself  2 1 1  0 0  Trouble concentrating 1 1 2  0 0  Moving slowly or fidgety/restless 1 0 0 1 2  Suicidal thoughts 0 0 0 0 0  PHQ-9 Score 15 9 15 7 10   Difficult doing work/chores Not difficult at all Somewhat difficult   Somewhat difficult   Anhedonia: no Weight changes: no Insomnia: yes hard to fall asleep and stay asleep Hypersomnia: no Fatigue/loss of energy: yes Feelings of worthlessness: no Feelings of guilt: no Impaired concentration/indecisiveness: yes concentration Suicidal ideations: no  Crying spells: no Recent Stressors/Life Changes: yes new job   Relationship problems: no   Family stress: yes  mom is dying   Financial stress: yes    Job stress: yes    Recent death/loss: no     02-14-23    3:45 PM 09/04/2022    3:37 PM 07/30/2022     2:02 PM 02/09/2022    2:00 PM  GAD 7 : Generalized Anxiety Score  Nervous, Anxious, on Edge 3 2 2 1   Control/stop worrying 3 2 2 1   Worry too much - different things 3 2 2 1   Trouble relaxing 2 2 3 2   Restless 2 2 3 1   Easily annoyed or irritable 2 1 1  0  Afraid - awful might happen 1 1 0 0  Total GAD 7 Score 16 12 13 6   Anxiety Difficulty Not difficult at all Somewhat difficult  Not difficult at all   HYPERLIPIDEMIA She is taking Rosuvastatin 10 mg daily  Satisfied with current treatment? yes Duration of hyperlipidemia: chronic Cholesterol medication side effects: no Cholesterol supplements: niacin Medication compliance: fair compliance Aspirin: no Recent stressors: yes Recurrent headaches: no Visual changes: no Palpitations: no Dyspnea: no Chest pain: no  CHRONIC RIGHT SHOULDER PAIN She is taking Flexxeril daily and tylenol: 500mg -1000mg  X2-3/day. She made an appointment with ortho, but did not attend due to schedule. Last referral sent 12/2021 Duration:2019 after fall at work Location: right Mechanism of injury: Dislocated and torn rotator cuff injury Onset:  Constant Severity: 10/10  Quality:  dull and aching Frequency: constant Radiation: yes to elbow Aggravating factors: Lifting  Alleviating factors: nothing  Status: worse Treatments attempted: tylenol, ice, and heat  Relief with NSAIDs?:  Unable to tolerate Swelling: no Redness: no  Warmth: yes Trauma: yes with fall Chest pain: no  Shortness of breath: no  Fever: no Decreased sensation: no Paresthesias: yes Weakness: yes    Relevant past medical, surgical, family and social history reviewed and updated as indicated. Interim medical history since our last visit reviewed. Allergies and medications reviewed and updated.  Review of Systems  Constitutional:  Positive for fatigue. Negative for unexpected weight change.  Respiratory: Negative.    Cardiovascular: Negative.   Musculoskeletal:  Negative for  arthralgias and myalgias.       Right shoulder dull ache with and without movement  Psychiatric/Behavioral:  Positive for decreased concentration, dysphoric mood and sleep disturbance. Negative for agitation, self-injury and suicidal ideas. The patient is nervous/anxious.     Per HPI unless specifically indicated above     Objective:    BP 126/82   Pulse 84   Temp 98.2 F (36.8 C) (Oral)   Wt 99 lb 3.2 oz (45 kg)   SpO2 98%   BMI 18.44 kg/m   Wt Readings from Last 3 Encounters:  02/01/23 99 lb 3.2 oz (45 kg)  09/04/22 95 lb 1.6 oz (43.1 kg)  07/30/22 91 lb 9.6 oz (41.5 kg)    Physical Exam Vitals and nursing note reviewed.  Constitutional:      General: She is awake. She is not in acute distress.    Appearance: Normal appearance. She is well-developed and well-groomed. She is not ill-appearing.  HENT:     Head: Normocephalic and atraumatic.     Right Ear: Hearing and external ear normal. No drainage.     Left Ear: Hearing and external ear normal. No drainage.     Nose: Nose normal.  Eyes:     General: Lids are normal.        Right eye: No discharge.        Left eye: No discharge.     Conjunctiva/sclera: Conjunctivae normal.  Cardiovascular:     Rate and Rhythm: Normal rate and regular rhythm.     Heart sounds: Normal heart sounds, S1 normal and S2 normal. No murmur heard.    No gallop.  Pulmonary:     Effort: Pulmonary effort is normal. No accessory muscle usage or respiratory distress.     Breath sounds: Normal breath sounds.  Musculoskeletal:     Right shoulder: Tenderness present. Decreased range of motion. Decreased strength.     Left shoulder: Normal.     Cervical back: Full passive range of motion without pain and normal range of motion.     Right lower leg: No edema.     Left lower leg: No edema.     Comments: Right shoulder weakness, unable to passively move shoulder due to pain  Skin:    General: Skin is warm and dry.     Capillary Refill: Capillary  refill takes less than 2 seconds.  Neurological:     Mental Status: She is alert and oriented to person, place, and time.  Psychiatric:        Attention and Perception: Attention normal.        Mood and Affect: Mood is anxious and depressed.        Speech: Speech normal.  Behavior: Behavior is cooperative.        Thought Content: Thought content normal.     Results for orders placed or performed in visit on 07/30/22  Fecal occult blood, imunochemical([facility])   Specimen: Stool   ST  Result Value Ref Range   Fecal Occult Bld Negative Negative  Microscopic Examination   Urine  Result Value Ref Range   WBC, UA 0-5 0 - 5 /hpf   RBC, Urine 0-2 0 - 2 /hpf   Epithelial Cells (non renal) 0-10 0 - 10 /hpf   Bacteria, UA None seen None seen/Few  CBC with Differential/Platelet  Result Value Ref Range   WBC 4.8 3.4 - 10.8 x10E3/uL   RBC 3.67 (L) 3.77 - 5.28 x10E6/uL   Hemoglobin 12.3 11.1 - 15.9 g/dL   Hematocrit 42.5 95.6 - 46.6 %   MCV 100 (H) 79 - 97 fL   MCH 33.5 (H) 26.6 - 33.0 pg   MCHC 33.5 31.5 - 35.7 g/dL   RDW 38.7 (L) 56.4 - 33.2 %   Platelets 324 150 - 450 x10E3/uL   Neutrophils 50 Not Estab. %   Lymphs 39 Not Estab. %   Monocytes 6 Not Estab. %   Eos 4 Not Estab. %   Basos 1 Not Estab. %   Neutrophils Absolute 2.4 1.4 - 7.0 x10E3/uL   Lymphocytes Absolute 1.9 0.7 - 3.1 x10E3/uL   Monocytes Absolute 0.3 0.1 - 0.9 x10E3/uL   EOS (ABSOLUTE) 0.2 0.0 - 0.4 x10E3/uL   Basophils Absolute 0.0 0.0 - 0.2 x10E3/uL   Immature Granulocytes 0 Not Estab. %   Immature Grans (Abs) 0.0 0.0 - 0.1 x10E3/uL  Comprehensive metabolic panel  Result Value Ref Range   Glucose 104 (H) 70 - 99 mg/dL   BUN 16 8 - 27 mg/dL   Creatinine, Ser 9.51 (H) 0.57 - 1.00 mg/dL   eGFR 57 (L) >88 CZ/YSA/6.30   BUN/Creatinine Ratio 15 12 - 28   Sodium 143 134 - 144 mmol/L   Potassium 4.6 3.5 - 5.2 mmol/L   Chloride 104 96 - 106 mmol/L   CO2 27 20 - 29 mmol/L   Calcium 9.3 8.7 - 10.3  mg/dL   Total Protein 6.6 6.0 - 8.5 g/dL   Albumin 4.2 3.8 - 4.8 g/dL   Globulin, Total 2.4 1.5 - 4.5 g/dL   Albumin/Globulin Ratio 1.8 1.2 - 2.2   Bilirubin Total <0.2 0.0 - 1.2 mg/dL   Alkaline Phosphatase 82 44 - 121 IU/L   AST 19 0 - 40 IU/L   ALT 17 0 - 32 IU/L  Lipid Panel w/o Chol/HDL Ratio  Result Value Ref Range   Cholesterol, Total 230 (H) 100 - 199 mg/dL   Triglycerides 160 (H) 0 - 149 mg/dL   HDL 49 >10 mg/dL   VLDL Cholesterol Cal 34 5 - 40 mg/dL   LDL Chol Calc (NIH) 932 (H) 0 - 99 mg/dL  Urinalysis, Routine w reflex microscopic  Result Value Ref Range   Specific Gravity, UA >1.030 (H) 1.005 - 1.030   pH, UA 5.5 5.0 - 7.5   Color, UA Yellow Yellow   Appearance Ur Clear Clear   Leukocytes,UA Negative Negative   Protein,UA 1+ (A) Negative/Trace   Glucose, UA Negative Negative   Ketones, UA 1+ (A) Negative   RBC, UA Negative Negative   Bilirubin, UA Negative Negative   Urobilinogen, Ur 0.2 0.2 - 1.0 mg/dL   Nitrite, UA Negative Negative   Microscopic Examination See  below:   TSH  Result Value Ref Range   TSH 0.376 (L) 0.450 - 4.500 uIU/mL  B12  Result Value Ref Range   Vitamin B-12 365 232 - 1,245 pg/mL  VITAMIN D 25 Hydroxy (Vit-D Deficiency, Fractures)  Result Value Ref Range   Vit D, 25-Hydroxy 65.3 30.0 - 100.0 ng/mL      Assessment & Plan:   Problem List Items Addressed This Visit       Other   Hyperlipidemia    Chronic, unstable. Lipid panel done today. Continue taking Crestor 10mg  daily, will increase dose based off lipid levels. F/u in 3 months.       Relevant Orders   Lipid Panel w/o Chol/HDL Ratio   Depression    Chronic, unstable. PHQ-9-15, GAD-7-16. Will increase seroquel to 75mg  nightly. F/u in 1 month.       Relevant Medications   QUEtiapine (SEROQUEL) 50 MG tablet   Allergy   Relevant Medications   benzonatate (TESSALON) 200 MG capsule   Other Visit Diagnoses     Fatigue due to depression    -  Primary   Chronic, ongoing.  Vitamin D, B12, and TSH done today. Continue taking Vitamin D and B12 daily and Trazodone nightly. Will treat based on labs.   Relevant Orders   Vitamin D (25 hydroxy)   Vitamin B12   Comp Met (CMET)   TSH   CBC With Diff/Platelet   Chronic right shoulder pain       Chronic, ongoing. Referral sent to ortho. Encouraged to continue use of Flexerill 5mg , tylenol, voltaren gel, and heat PRN and aovidance of NSAIDs.   Relevant Orders   Ambulatory referral to Orthopedic Surgery        Follow up plan: Return in about 1 month (around 03/04/2023) for Anxiety, Follow up.

## 2023-02-02 LAB — CBC WITH DIFF/PLATELET
Basophils Absolute: 0 10*3/uL (ref 0.0–0.2)
Basos: 0 %
EOS (ABSOLUTE): 0.2 10*3/uL (ref 0.0–0.4)
Eos: 4 %
Hematocrit: 32.5 % — ABNORMAL LOW (ref 34.0–46.6)
Hemoglobin: 10.9 g/dL — ABNORMAL LOW (ref 11.1–15.9)
Immature Grans (Abs): 0 10*3/uL (ref 0.0–0.1)
Immature Granulocytes: 0 %
Lymphocytes Absolute: 1.8 10*3/uL (ref 0.7–3.1)
Lymphs: 35 %
MCH: 33.5 pg — ABNORMAL HIGH (ref 26.6–33.0)
MCHC: 33.5 g/dL (ref 31.5–35.7)
MCV: 100 fL — ABNORMAL HIGH (ref 79–97)
Monocytes Absolute: 0.2 10*3/uL (ref 0.1–0.9)
Monocytes: 5 %
Neutrophils Absolute: 2.8 10*3/uL (ref 1.4–7.0)
Neutrophils: 56 %
Platelets: 346 10*3/uL (ref 150–450)
RBC: 3.25 x10E6/uL — ABNORMAL LOW (ref 3.77–5.28)
RDW: 11.5 % — ABNORMAL LOW (ref 11.7–15.4)
WBC: 5.1 10*3/uL (ref 3.4–10.8)

## 2023-02-02 LAB — COMPREHENSIVE METABOLIC PANEL
ALT: 8 IU/L (ref 0–32)
AST: 13 IU/L (ref 0–40)
Albumin/Globulin Ratio: 1.7 (ref 1.2–2.2)
Albumin: 4 g/dL (ref 3.8–4.8)
Alkaline Phosphatase: 93 IU/L (ref 44–121)
BUN/Creatinine Ratio: 16 (ref 12–28)
BUN: 12 mg/dL (ref 8–27)
Bilirubin Total: <0.2 mg/dL (ref 0.0–1.2)
CO2: 25 mmol/L (ref 20–29)
Calcium: 8.5 mg/dL — ABNORMAL LOW (ref 8.7–10.3)
Chloride: 105 mmol/L (ref 96–106)
Creatinine, Ser: 0.75 mg/dL (ref 0.57–1.00)
Globulin, Total: 2.4 g/dL (ref 1.5–4.5)
Glucose: 78 mg/dL (ref 70–99)
Potassium: 3.9 mmol/L (ref 3.5–5.2)
Sodium: 139 mmol/L (ref 134–144)
Total Protein: 6.4 g/dL (ref 6.0–8.5)
eGFR: 85 mL/min/{1.73_m2} (ref >59)

## 2023-02-02 LAB — LIPID PANEL W/O CHOL/HDL RATIO
Cholesterol, Total: 177 mg/dL (ref 100–199)
HDL: 38 mg/dL — ABNORMAL LOW (ref >39)
LDL Chol Calc (NIH): 114 mg/dL — ABNORMAL HIGH (ref 0–99)
Triglycerides: 137 mg/dL (ref 0–149)
VLDL Cholesterol Cal: 25 mg/dL (ref 5–40)

## 2023-02-02 LAB — VITAMIN B12: Vitamin B-12: 360 pg/mL (ref 232–1245)

## 2023-02-02 LAB — TSH: TSH: 0.85 u[IU]/mL (ref 0.450–4.500)

## 2023-02-02 LAB — VITAMIN D 25 HYDROXY (VIT D DEFICIENCY, FRACTURES): Vit D, 25-Hydroxy: 62.8 ng/mL (ref 30.0–100.0)

## 2023-02-12 ENCOUNTER — Other Ambulatory Visit: Payer: Self-pay | Admitting: Family Medicine

## 2023-02-12 DIAGNOSIS — M25572 Pain in left ankle and joints of left foot: Secondary | ICD-10-CM

## 2023-02-12 DIAGNOSIS — F3342 Major depressive disorder, recurrent, in full remission: Secondary | ICD-10-CM

## 2023-02-12 NOTE — Telephone Encounter (Signed)
Unable to refill per protocol, Rx request is too soon. Last refill 01/12/23 for 90 days.  Requested Prescriptions  Pending Prescriptions Disp Refills   cyclobenzaprine (FLEXERIL) 5 MG tablet [Pharmacy Med Name: CYCLOBENZAPRINE 5 MG TABLET] 30 tablet 1    Sig: TAKE 1 TABLET BY MOUTH EVERYDAY AT BEDTIME     Not Delegated - Analgesics:  Muscle Relaxants Failed - 02/12/2023  1:45 AM      Failed - This refill cannot be delegated      Passed - Valid encounter within last 6 months    Recent Outpatient Visits           1 week ago Fatigue due to depression   [place] [facility] Pearley, Sherran Needs, NP   5 months ago Anxiety   [place] [facility] [place], Megan P, DO   6 months ago Encounter for annual wellness visit (AWV) in Medicare patient   [place] [facility] [place], [place], DO   12 months ago Concussion with unknown loss of consciousness status, subsequent encounter   [place] [facility] [place], Megan P, DO   1 year ago Concussion with unknown loss of consciousness status, initial encounter   [place] [facility] [place], [place], DO       Future Appointments             In 3 weeks Johnson, Megan P, DO [place] [facility]             traZODone (DESYREL) 100 MG tablet [Pharmacy Med Name: TRAZODONE 100 MG TABLET] 135 tablet 0    Sig: TAKE 1-1.5 TABLETS (100-150 MG TOTAL) BY MOUTH AT BEDTIME AS NEEDED. FOR SLEEP     Psychiatry: Antidepressants - Serotonin Modulator Passed - 02/12/2023  1:45 AM      Passed - Completed PHQ-2 or PHQ-9 in the last 360 days      Passed - Valid encounter within last 6 months    Recent Outpatient Visits           1 week ago Fatigue due to depression   [place] [facility] Pearley, Sherran Needs, NP   5 months ago Anxiety   [place] [facility] [place], Megan P, DO   6 months ago Encounter for annual  wellness visit (AWV) in Medicare patient   [place] [facility] [place], Megan P, DO   12 months ago Concussion with unknown loss of consciousness status, subsequent encounter   [place] [facility] [place], Megan P, DO   1 year ago Concussion with unknown loss of consciousness status, initial encounter    [facility] Dorcas Carrow, DO       Future Appointments             In 3 weeks Laural Benes, Oralia Rud, DO  [facility]

## 2023-02-12 NOTE — Telephone Encounter (Signed)
Requested medication (s) are due for refill today: yes  Requested medication (s) are on the active medication list: yes  Last refill:  12/07/22  Future visit scheduled: yes  Notes to clinic:  Unable to refill per protocol, cannot delegate.      Requested Prescriptions  Pending Prescriptions Disp Refills   cyclobenzaprine (FLEXERIL) 5 MG tablet [Pharmacy Med Name: CYCLOBENZAPRINE 5 MG TABLET] 30 tablet 1    Sig: TAKE 1 TABLET BY MOUTH EVERYDAY AT BEDTIME     Not Delegated - Analgesics:  Muscle Relaxants Failed - 02/12/2023  1:45 AM      Failed - This refill cannot be delegated      Passed - Valid encounter within last 6 months    Recent Outpatient Visits           1 week ago Fatigue due to depression   [place] [facility] Pearley, Sherran Needs, NP   5 months ago Anxiety   [place] [facility] [place], Megan P, DO   6 months ago Encounter for annual wellness visit (AWV) in Medicare patient   [place] [facility] [place], [place], DO   12 months ago Concussion with unknown loss of consciousness status, subsequent encounter   [place] [facility] [place], Megan P, DO   1 year ago Concussion with unknown loss of consciousness status, initial encounter   [place] [facility] [place], [place], DO       Future Appointments             In 3 weeks Laural Benes, Oralia Rud, DO [place] [facility]            Refused Prescriptions Disp Refills   traZODone (DESYREL) 100 MG tablet [Pharmacy Med Name: TRAZODONE 100 MG TABLET] 135 tablet 0    Sig: TAKE 1-1.5 TABLETS (100-150 MG TOTAL) BY MOUTH AT BEDTIME AS NEEDED. FOR SLEEP     Psychiatry: Antidepressants - Serotonin Modulator Passed - 02/12/2023  1:45 AM      Passed - Completed PHQ-2 or PHQ-9 in the last 360 days      Passed - Valid encounter within last 6 months    Recent Outpatient Visits           1 week ago Fatigue due to  depression   [place] [facility] Pearley, Sherran Needs, NP   5 months ago Anxiety   [place] [facility] [place], Megan P, DO   6 months ago Encounter for annual wellness visit (AWV) in Medicare patient   [place] [facility] [place], Megan P, DO   12 months ago Concussion with unknown loss of consciousness status, subsequent encounter   [place] [facility] [place], Megan P, DO   1 year ago Concussion with unknown loss of consciousness status, initial encounter    [facility] Dorcas Carrow, DO       Future Appointments             In 3 weeks Laural Benes, Oralia Rud, DO  [facility]

## 2023-02-12 NOTE — Telephone Encounter (Signed)
Requested medication (s) are due for refill today: routing for review  Requested medication (s) are on the active medication list: yes  Last refill:  02/01/23  Future visit scheduled: no  Notes to clinic:  Unable to refill per protocol, cannot delegate.      Requested Prescriptions  Pending Prescriptions Disp Refills   QUEtiapine (SEROQUEL) 50 MG tablet [Pharmacy Med Name: QUETIAPINE FUMARATE 50 MG TAB] 30 tablet 0    Sig: TAKE 1 & 1/2TABLETS BY MOUTH AT BEDTIME.     Not Delegated - Psychiatry:  Antipsychotics - Second Generation (Atypical) - quetiapine Failed - 02/12/2023  1:45 AM      Failed - This refill cannot be delegated      Failed - Lipid Panel in normal range within the last 12 months    Cholesterol, Total  Date Value Ref Range Status  02/01/2023 177 100 - 199 mg/dL Final   Cholesterol Piccolo, Waived  Date Value Ref Range Status  11/12/2016 173 <200 mg/dL Final    Comment:                            Desirable                <200                         Borderline High      200- 239                         High                     >239    LDL Chol Calc (NIH)  Date Value Ref Range Status  02/01/2023 114 (H) 0 - 99 mg/dL Final   HDL  Date Value Ref Range Status  02/01/2023 38 (L) >39 mg/dL Final   Triglycerides  Date Value Ref Range Status  02/01/2023 137 0 - 149 mg/dL Final   Triglycerides Piccolo,Waived  Date Value Ref Range Status  11/12/2016 210 (H) <150 mg/dL Final    Comment:                            Normal                   <150                         Borderline High     150 - 199                         High                200 - 499                         Very High                >499          Failed - CMP within normal limits and completed in the last 12 months    Albumin  Date Value Ref Range Status  02/01/2023 4.0 3.8 - 4.8 g/dL Final   Alkaline Phosphatase  Date Value Ref Range Status  02/01/2023 93 44 - 121 IU/L Final  ALT   Date Value Ref Range Status  02/01/2023 8 0 - 32 IU/L Final   ALT (SGPT) Piccolo, Waived  Date Value Ref Range Status  11/12/2016 15 10 - 47 U/L Final   AST  Date Value Ref Range Status  02/01/2023 13 0 - 40 IU/L Final   AST (SGOT) Piccolo, Waived  Date Value Ref Range Status  11/12/2016 19 11 - 38 U/L Final   BUN  Date Value Ref Range Status  02/01/2023 12 8 - 27 mg/dL Final   Calcium  Date Value Ref Range Status  02/01/2023 8.5 (L) 8.7 - 10.3 mg/dL Final   CO2  Date Value Ref Range Status  02/01/2023 25 20 - 29 mmol/L Final   Bicarbonate  Date Value Ref Range Status  04/07/2018 20.1 20.0 - 28.0 mmol/L Corrected   Creatinine, Ser  Date Value Ref Range Status  02/01/2023 0.75 0.57 - 1.00 mg/dL Final   Glucose  Date Value Ref Range Status  02/01/2023 78 70 - 99 mg/dL Final   Glucose, Bld  Date Value Ref Range Status  07/18/2020 81 70 - 99 mg/dL Final    Comment:    Glucose reference range applies only to samples taken after fasting for at least 8 hours.   Glucose-Capillary  Date Value Ref Range Status  04/14/2018 84 70 - 99 mg/dL Final   Potassium  Date Value Ref Range Status  02/01/2023 3.9 3.5 - 5.2 mmol/L Final   Sodium  Date Value Ref Range Status  02/01/2023 139 134 - 144 mmol/L Final   Bilirubin Total  Date Value Ref Range Status  02/01/2023 <0.2 0.0 - 1.2 mg/dL Final   Bilirubin, Direct  Date Value Ref Range Status  04/05/2018 0.2 0.0 - 0.2 mg/dL Final    Comment:    Please note change in reference range.   Indirect Bilirubin  Date Value Ref Range Status  04/05/2018 0.4 0.3 - 0.9 mg/dL Final    Comment:    Performed at [facility], [address] [place], [place] [address]   Protein, ur  Date Value Ref Range Status  04/05/2018 30 (A) NEGATIVE mg/dL Final   Protein,UA  Date Value Ref Range Status  07/30/2022 1+ (A) Negative/Trace Final   Total Protein  Date Value Ref Range Status  02/01/2023 6.4 6.0 - 8.5 g/dL Final    GFR calc Af Amer  Date Value Ref Range Status  11/24/2019 57 (L) >59 mL/min/1.73 Final   eGFR  Date Value Ref Range Status  02/01/2023 85 >59 mL/min/1.73 Final   GFR, Estimated  Date Value Ref Range Status  07/18/2020 >60 >60 mL/min Final    Comment:    (NOTE) Calculated using the CKD-EPI Creatinine Equation (2021)          Passed - TSH in normal range and within 360 days    TSH  Date Value Ref Range Status  02/01/2023 0.850 0.450 - 4.500 uIU/mL Final         Passed - Completed PHQ-2 or PHQ-9 in the last 360 days      Passed - Last BP in normal range    BP Readings from Last 1 Encounters:  02/01/23 126/82         Passed - Last Heart Rate in normal range    Pulse Readings from Last 1 Encounters:  02/01/23 84         Passed - Valid encounter within last 6 months    Recent Outpatient Visits  1 week ago Fatigue due to depression   [place] [facility] [place], Sherran Needs, NP   5 months ago Anxiety   [place] [facility] [place], Megan P, DO   6 months ago Encounter for annual wellness visit (AWV) in Medicare patient   [facility] [place], Megan P, DO   12 months ago Concussion with unknown loss of consciousness status, subsequent encounter   [place] [facility] [place], Megan P, DO   1 year ago Concussion with unknown loss of consciousness status, initial encounter   [place] [facility] Dorcas Carrow, DO       Future Appointments             In 3 weeks Laural Benes, Oralia Rud, DO  [facility]            Passed - CBC within normal limits and completed in the last 12 months    WBC  Date Value Ref Range Status  02/01/2023 5.1 3.4 - 10.8 x10E3/uL Final  07/18/2020 4.6 4.0 - 10.5 K/uL Final   RBC  Date Value Ref Range Status  02/01/2023 3.25 (L) 3.77 - 5.28 x10E6/uL Final  07/18/2020 3.40 (L) 3.87 - 5.11 MIL/uL Final    Hemoglobin  Date Value Ref Range Status  02/01/2023 10.9 (L) 11.1 - 15.9 g/dL Final   Hematocrit  Date Value Ref Range Status  02/01/2023 32.5 (L) 34.0 - 46.6 % Final   MCHC  Date Value Ref Range Status  02/01/2023 33.5 31.5 - 35.7 g/dL Final  16/06/9603 54.0 30.0 - 36.0 g/dL Final   [facility]  Date Value Ref Range Status  02/01/2023 33.5 (H) 26.6 - 33.0 pg Final  07/18/2020 32.9 26.0 - 34.0 pg Final   MCV  Date Value Ref Range Status  02/01/2023 100 (H) 79 - 97 fL Final   No results found for: "PLTCOUNTKUC", "LABPLAT", "POCPLA" RDW  Date Value Ref Range Status  02/01/2023 11.5 (L) 11.7 - 15.4 % Final

## 2023-02-13 ENCOUNTER — Other Ambulatory Visit: Payer: Self-pay | Admitting: Family Medicine

## 2023-02-15 NOTE — Telephone Encounter (Signed)
Requested Prescriptions  Pending Prescriptions Disp Refills   albuterol (VENTOLIN HFA) 108 (90 Base) MCG/ACT inhaler [Pharmacy Med Name: ALBUTEROL HFA (VENTOLIN) INH] 18 each 0    Sig: TAKE 2 PUFFS BY MOUTH EVERY 6 HOURS AS NEEDED FOR WHEEZE OR SHORTNESS OF BREATH     Pulmonology:  Beta Agonists 2 Passed - 02/13/2023  8:50 AM      Passed - Last BP in normal range    BP Readings from Last 1 Encounters:  02/01/23 126/82         Passed - Last Heart Rate in normal range    Pulse Readings from Last 1 Encounters:  02/01/23 84         Passed - Valid encounter within last 12 months    Recent Outpatient Visits           2 weeks ago Fatigue due to depression   [facility] [place], Sherran Needs, NP   5 months ago Anxiety   [place] [facility] [place], Megan P, DO   6 months ago Encounter for annual wellness visit (AWV) in Medicare patient   [place] [facility] [place], [place], DO   12 months ago Concussion with unknown loss of consciousness status, subsequent encounter   [place] [place], Megan P, DO   1 year ago Concussion with unknown loss of consciousness status, initial encounter   [place] [facility] Dorcas Carrow, DO       Future Appointments             In 3 weeks Laural Benes, Oralia Rud, DO [facility]

## 2023-02-16 ENCOUNTER — Other Ambulatory Visit: Payer: Self-pay | Admitting: Family Medicine

## 2023-02-16 DIAGNOSIS — M25572 Pain in left ankle and joints of left foot: Secondary | ICD-10-CM

## 2023-02-16 NOTE — Telephone Encounter (Signed)
Requested medication (s) are due for refill today - no  Requested medication (s) are on the active medication list -yes  Future visit scheduled -yes  Last refill: 02/16/23  Notes to clinic: Pharmacy request: Alternative: PA needed- plan limits exceeded   Requested Prescriptions  Pending Prescriptions Disp Refills   tiZANidine (ZANAFLEX) 2 MG tablet [Pharmacy Med Name: TIZANIDINE HCL 2 MG TABLET]  0     Not Delegated - Cardiovascular:  Alpha-2 Agonists - tizanidine Failed - 02/16/2023 12:47 PM      Failed - This refill cannot be delegated      Passed - Valid encounter within last 6 months    Recent Outpatient Visits           2 weeks ago Fatigue due to depression   [place] [facility] Pearley, Sherran Needs, NP   5 months ago Anxiety   [place] [facility] [place], Megan P, DO   6 months ago Encounter for annual wellness visit (AWV) in Medicare patient   [place] [facility] [place], [place], DO   1 year ago Concussion with unknown loss of consciousness status, subsequent encounter   [place] [facility] [place], Megan P, DO   1 year ago Concussion with unknown loss of consciousness status, initial encounter   [place] [facility] Dorcas Carrow, DO       Future Appointments             In 2 weeks Laural Benes, Oralia Rud, DO [facility]               Requested Prescriptions  Pending Prescriptions Disp Refills   tiZANidine (ZANAFLEX) 2 MG tablet [Pharmacy Med Name: TIZANIDINE HCL 2 MG TABLET]  0     Not Delegated - Cardiovascular:  Alpha-2 Agonists - tizanidine Failed - 02/16/2023 12:47 PM      Failed - This refill cannot be delegated      Passed - Valid encounter within last 6 months    Recent Outpatient Visits           2 weeks ago Fatigue due to depression   [place] [facility] Pearley, Sherran Needs, NP   5 months ago Anxiety    [place] [facility] [place], Megan P, DO   6 months ago Encounter for annual wellness visit (AWV) in Medicare patient   [place] [facility] [place], [place], DO   1 year ago Concussion with unknown loss of consciousness status, subsequent encounter   [place] [facility] [place], Megan P, DO   1 year ago Concussion with unknown loss of consciousness status, initial encounter   [place] [facility] Dorcas Carrow, DO       Future Appointments             In 2 weeks Laural Benes, Oralia Rud, DO [facility], [facility]

## 2023-02-23 ENCOUNTER — Other Ambulatory Visit: Payer: Self-pay | Admitting: Family Medicine

## 2023-02-23 DIAGNOSIS — F3342 Major depressive disorder, recurrent, in full remission: Secondary | ICD-10-CM

## 2023-02-24 NOTE — Telephone Encounter (Signed)
Requested medication (s) are due for refill today:   Requested medication (s) are on the active medication list: Yes  Last refill:  Zofran - 01/08/23 #60 with 1 refill    Seroquel 02/16/23 #45 with 1 refill  Future visit scheduled: Yes  Notes to clinic:  Not delegated.    Requested Prescriptions  Pending Prescriptions Disp Refills   ondansetron (ZOFRAN) 4 MG tablet [Pharmacy Med Name: ONDANSETRON HCL 4 MG TABLET] 60 tablet 1    Sig: DISSOLVE 1 TABLET IN MOUTH AS NEEDED FOR NAUSEA UP TO 2 TIMES A DAY     Not Delegated - Gastroenterology: Antiemetics - ondansetron Failed - 02/23/2023 10:17 AM      Failed - This refill cannot be delegated      Passed - AST in normal range and within 360 days    AST  Date Value Ref Range Status  02/01/2023 13 0 - 40 IU/L Final   AST (SGOT) Piccolo, Waived  Date Value Ref Range Status  11/12/2016 19 11 - 38 U/L Final         Passed - ALT in normal range and within 360 days    ALT  Date Value Ref Range Status  02/01/2023 8 0 - 32 IU/L Final   ALT (SGPT) Piccolo, Waived  Date Value Ref Range Status  11/12/2016 15 10 - 47 U/L Final         Passed - Valid encounter within last 6 months    Recent Outpatient Visits           3 weeks ago Fatigue due to depression   [place] [facility] Pearley, Sherran Needs, NP   5 months ago Anxiety   [place] [facility] [place], Megan P, DO   6 months ago Encounter for annual wellness visit (AWV) in Medicare patient   [place] [facility] [place], [place], DO   1 year ago Concussion with unknown loss of consciousness status, subsequent encounter   [place] [facility] [place], Megan P, DO   1 year ago Concussion with unknown loss of consciousness status, initial encounter   [facility] [place], [place], DO       Future Appointments             In 1 week Johnson, Megan P, DO [place] [facility],  [facility]             QUEtiapine (SEROQUEL) 50 MG tablet [Pharmacy Med Name: QUETIAPINE FUMARATE 50 MG TAB] 30 tablet     Sig: TAKE 1 TABLET BY MOUTH EVERYDAY AT BEDTIME     Not Delegated - Psychiatry:  Antipsychotics - Second Generation (Atypical) - quetiapine Failed - 02/23/2023 10:17 AM      Failed - This refill cannot be delegated      Failed - Lipid Panel in normal range within the last 12 months    Cholesterol, Total  Date Value Ref Range Status  02/01/2023 177 100 - 199 mg/dL Final   Cholesterol Piccolo, Waived  Date Value Ref Range Status  11/12/2016 173 <200 mg/dL Final    Comment:                            Desirable                <200  Borderline High      200- 239                         High                     >239    LDL Chol Calc (NIH)  Date Value Ref Range Status  02/01/2023 114 (H) 0 - 99 mg/dL Final   HDL  Date Value Ref Range Status  02/01/2023 38 (L) >39 mg/dL Final   Triglycerides  Date Value Ref Range Status  02/01/2023 137 0 - 149 mg/dL Final   Triglycerides Piccolo,Waived  Date Value Ref Range Status  11/12/2016 210 (H) <150 mg/dL Final    Comment:                            Normal                   <150                         Borderline High     150 - 199                         High                200 - 499                         Very High                >499          Passed - TSH in normal range and within 360 days    TSH  Date Value Ref Range Status  02/01/2023 0.850 0.450 - 4.500 uIU/mL Final         Passed - Completed PHQ-2 or PHQ-9 in the last 360 days      Passed - Last BP in normal range    BP Readings from Last 1 Encounters:  02/01/23 126/82         Passed - Last Heart Rate in normal range    Pulse Readings from Last 1 Encounters:  02/01/23 84         Passed - Valid encounter within last 6 months    Recent Outpatient Visits           3 weeks ago Fatigue due to depression   [place] [place], Sherran Needs, NP   5 months ago Anxiety   [place] [facility] [place], Megan P, DO   6 months ago Encounter for annual wellness visit (AWV) in Medicare patient   [place] [facility] [place], [place], DO   1 year ago Concussion with unknown loss of consciousness status, subsequent encounter   [place] [facility] [place], Megan P, DO   1 year ago Concussion with unknown loss of consciousness status, initial encounter   [place] [facility] Dorcas Carrow, DO       Future Appointments             In 1 week Dorcas Carrow, DO [place] [facility]            Passed - CBC within normal limits and completed in  the last 12 months    WBC  Date Value Ref Range Status  02/01/2023 5.1 3.4 - 10.8 x10E3/uL Final  07/18/2020 4.6 4.0 - 10.5 K/uL Final   RBC  Date Value Ref Range Status  02/01/2023 3.25 (L) 3.77 - 5.28 x10E6/uL Final  07/18/2020 3.40 (L) 3.87 - 5.11 MIL/uL Final   Hemoglobin  Date Value Ref Range Status  02/01/2023 10.9 (L) 11.1 - 15.9 g/dL Final   Hematocrit  Date Value Ref Range Status  02/01/2023 32.5 (L) 34.0 - 46.6 % Final   MCHC  Date Value Ref Range Status  02/01/2023 33.5 31.5 - 35.7 g/dL Final  16/06/9603 54.0 30.0 - 36.0 g/dL Final   [facility]  Date Value Ref Range Status  02/01/2023 33.5 (H) 26.6 - 33.0 pg Final  07/18/2020 32.9 26.0 - 34.0 pg Final   MCV  Date Value Ref Range Status  02/01/2023 100 (H) 79 - 97 fL Final   No results found for: "PLTCOUNTKUC", "LABPLAT", "POCPLA" RDW  Date Value Ref Range Status  02/01/2023 11.5 (L) 11.7 - 15.4 % Final         Passed - CMP within normal limits and completed in the last 12 months    Albumin  Date Value Ref Range Status  02/01/2023 4.0 3.8 - 4.8 g/dL Final   Alkaline Phosphatase  Date Value Ref Range Status  02/01/2023 93 44 - 121 IU/L Final   ALT  Date Value Ref Range  Status  02/01/2023 8 0 - 32 IU/L Final   ALT (SGPT) Piccolo, Waived  Date Value Ref Range Status  11/12/2016 15 10 - 47 U/L Final   AST  Date Value Ref Range Status  02/01/2023 13 0 - 40 IU/L Final   AST (SGOT) Piccolo, Waived  Date Value Ref Range Status  11/12/2016 19 11 - 38 U/L Final   BUN  Date Value Ref Range Status  02/01/2023 12 8 - 27 mg/dL Final   Calcium  Date Value Ref Range Status  02/01/2023 8.5 (L) 8.7 - 10.3 mg/dL Final   CO2  Date Value Ref Range Status  02/01/2023 25 20 - 29 mmol/L Final   Bicarbonate  Date Value Ref Range Status  04/07/2018 20.1 20.0 - 28.0 mmol/L Corrected   Creatinine, Ser  Date Value Ref Range Status  02/01/2023 0.75 0.57 - 1.00 mg/dL Final   Glucose  Date Value Ref Range Status  02/01/2023 78 70 - 99 mg/dL Final   Glucose, Bld  Date Value Ref Range Status  07/18/2020 81 70 - 99 mg/dL Final    Comment:    Glucose reference range applies only to samples taken after fasting for at least 8 hours.   Glucose-Capillary  Date Value Ref Range Status  04/14/2018 84 70 - 99 mg/dL Final   Potassium  Date Value Ref Range Status  02/01/2023 3.9 3.5 - 5.2 mmol/L Final   Sodium  Date Value Ref Range Status  02/01/2023 139 134 - 144 mmol/L Final   Bilirubin Total  Date Value Ref Range Status  02/01/2023 <0.2 0.0 - 1.2 mg/dL Final   Bilirubin, Direct  Date Value Ref Range Status  04/05/2018 0.2 0.0 - 0.2 mg/dL Final    Comment:    Please note change in reference range.   Indirect Bilirubin  Date Value Ref Range Status  04/05/2018 0.4 0.3 - 0.9 mg/dL Final    Comment:    Performed at [facility], [address], [place], [place] [address]  Protein, ur  Date Value Ref Range Status  04/05/2018 30 (A) NEGATIVE mg/dL Final   Protein,UA  Date Value Ref Range Status  07/30/2022 1+ (A) Negative/Trace Final   Total Protein  Date Value Ref Range Status  02/01/2023 6.4 6.0 - 8.5 g/dL Final   GFR calc Af Amer   Date Value Ref Range Status  11/24/2019 57 (L) >59 mL/min/1.73 Final   eGFR  Date Value Ref Range Status  02/01/2023 85 >59 mL/min/1.73 Final   GFR, Estimated  Date Value Ref Range Status  07/18/2020 >60 >60 mL/min Final    Comment:    (NOTE) Calculated using the CKD-EPI Creatinine Equation (2021)

## 2023-03-08 ENCOUNTER — Other Ambulatory Visit: Payer: Self-pay | Admitting: Family Medicine

## 2023-03-08 ENCOUNTER — Encounter: Payer: Self-pay | Admitting: Family Medicine

## 2023-03-08 ENCOUNTER — Ambulatory Visit (INDEPENDENT_AMBULATORY_CARE_PROVIDER_SITE_OTHER): Payer: Medicare HMO | Admitting: Family Medicine

## 2023-03-08 VITALS — BP 116/72 | HR 78 | Temp 98.8°F | Wt 103.2 lb

## 2023-03-08 DIAGNOSIS — F331 Major depressive disorder, recurrent, moderate: Secondary | ICD-10-CM | POA: Diagnosis not present

## 2023-03-08 DIAGNOSIS — G8929 Other chronic pain: Secondary | ICD-10-CM | POA: Diagnosis not present

## 2023-03-08 DIAGNOSIS — M25511 Pain in right shoulder: Secondary | ICD-10-CM | POA: Diagnosis not present

## 2023-03-08 MED ORDER — QUETIAPINE FUMARATE ER 150 MG PO TB24: 150.0000 mg | ORAL_TABLET | Freq: Every day | ORAL | 2 refills | Status: DC

## 2023-03-08 MED ORDER — KETOROLAC TROMETHAMINE 60 MG/2ML IM SOLN
30.0000 mg | Freq: Once | INTRAMUSCULAR | Status: AC
Start: 2023-03-08 — End: 2023-03-08
  Administered 2023-03-08: 30 mg via INTRAMUSCULAR

## 2023-03-08 NOTE — Assessment & Plan Note (Signed)
Not doing well with recent losses. Not sleeping. Will increase her seroquel to 100mg  for 1 week, then increase to 150mg . Follow up 2 weeks. Call with any concerns.

## 2023-03-08 NOTE — Patient Instructions (Addendum)
[facility] Thursday 03/11/23 3PM [address] [place], [place] [address] Phone: [phone]

## 2023-03-08 NOTE — Progress Notes (Signed)
BP 116/72   Pulse 78   Temp 98.8 F (37.1 C) (Oral)   Wt 103 lb 3.2 oz (46.8 kg)   SpO2 98%   BMI 19.19 kg/m    Subjective:    Patient ID: Annette Miller, female    DOB: 1951/03/28, 72 y.o.   MRN: [id number]  HPI: Annette Miller is a 72 y.o. female  Chief Complaint  Patient presents with   Anxiety    Patient says the medication Seroquel is not helping her getting any sleep. Patient says she is not doing well due to her mother passing away on this past Thursday and says the funeral is this week and does not think she can handle it.    Her R arm is hurting a lot. Hasn't seen ortho in a while. Aching and sore and radiating from her shoulder into her arm. Nothing makes it better an movement makes it worse. No other concerns or complaints at this time.   ANXIETY/DEPRESSION- lost her mother and her dog since her last appointment Duration: chronic Status:exacerbated Anxious mood: yes  Excessive worrying: yes Irritability: yes  Sweating: no Nausea: no Palpitations:no Hyperventilation: no Panic attacks: yes Agoraphobia: no  Obscessions/compulsions: no Depressed mood: yes    02/01/2023    3:44 PM 09/04/2022    3:37 PM 07/30/2022    2:02 PM 02/09/2022    2:00 PM 12/15/2021    8:25 AM  Depression screen PHQ 2/9  Decreased Interest 1 1 1  0 1  Down, Depressed, Hopeless 2 1 3  0 0  PHQ - 2 Score 3 2 4  0 1  Altered sleeping 3 2 3 3 3   Tired, decreased energy 3 1 2 2 2   Change in appetite 2 2 3 1 2   Feeling bad or failure about yourself  2 1 1  0 0  Trouble concentrating 1 1 2  0 0  Moving slowly or fidgety/restless 1 0 0 1 2  Suicidal thoughts 0 0 0 0 0  PHQ-9 Score 15 9 15 7 10   Difficult doing work/chores Not difficult at all Somewhat difficult   Somewhat difficult      03/08/2023    3:36 PM 02/01/2023    3:45 PM 09/04/2022    3:37 PM 07/30/2022    2:02 PM  GAD 7 : Generalized Anxiety Score  Nervous, Anxious, on Edge 2 3 2 2   Control/stop worrying 3 3 2 2   Worry too much -  different things 3 3 2 2   Trouble relaxing 3 2 2 3   Restless 3 2 2 3   Easily annoyed or irritable 3 2 1 1   Afraid - awful might happen 1 1 1  0  Total GAD 7 Score 18 16 12 13   Anxiety Difficulty  Not difficult at all Somewhat difficult    Anhedonia: no Weight changes: no Insomnia: yes hard to fall asleep  Hypersomnia: yes Fatigue/loss of energy: yes Feelings of worthlessness: yes Feelings of guilt: yes Impaired concentration/indecisiveness: yes Suicidal ideations: no  Crying spells: yes Recent Stressors/Life Changes: yes   Relationship problems: no   Family stress: yes     Financial stress: no    Job stress: yes    Recent death/loss: yes  Relevant past medical, surgical, family and social history reviewed and updated as indicated. Interim medical history since our last visit reviewed. Allergies and medications reviewed and updated.  Review of Systems  Constitutional: Negative.   Respiratory: Negative.    Cardiovascular: Negative.   Gastrointestinal: Negative.  Musculoskeletal:  Positive for arthralgias. Negative for back pain, gait problem, joint swelling, myalgias, neck pain and neck stiffness.  Skin: Negative.   Neurological: Negative.   Psychiatric/Behavioral: Negative.      Per HPI unless specifically indicated above     Objective:    BP 116/72   Pulse 78   Temp 98.8 F (37.1 C) (Oral)   Wt 103 lb 3.2 oz (46.8 kg)   SpO2 98%   BMI 19.19 kg/m   Wt Readings from Last 3 Encounters:  03/08/23 103 lb 3.2 oz (46.8 kg)  02/01/23 99 lb 3.2 oz (45 kg)  09/04/22 95 lb 1.6 oz (43.1 kg)    Physical Exam Vitals and nursing note reviewed.  Constitutional:      General: She is not in acute distress.    Appearance: Normal appearance. She is normal weight. She is not ill-appearing, toxic-appearing or diaphoretic.  HENT:     Head: Normocephalic and atraumatic.     Right Ear: External ear normal.     Left Ear: External ear normal.     Nose: Nose normal.      Mouth/Throat:     Mouth: Mucous membranes are moist.     Pharynx: Oropharynx is clear.  Eyes:     General: No scleral icterus.       Right eye: No discharge.        Left eye: No discharge.     Extraocular Movements: Extraocular movements intact.     Conjunctiva/sclera: Conjunctivae normal.     Pupils: Pupils are equal, round, and reactive to light.  Cardiovascular:     Rate and Rhythm: Normal rate and regular rhythm.     Pulses: Normal pulses.     Heart sounds: Normal heart sounds. No murmur heard.    No friction rub. No gallop.  Pulmonary:     Effort: Pulmonary effort is normal. No respiratory distress.     Breath sounds: Normal breath sounds. No stridor. No wheezing, rhonchi or rales.  Chest:     Chest wall: No tenderness.  Musculoskeletal:        General: Normal range of motion.     Cervical back: Normal range of motion and neck supple.  Skin:    General: Skin is warm and dry.     Capillary Refill: Capillary refill takes less than 2 seconds.     Coloration: Skin is not jaundiced or pale.     Findings: No bruising, erythema, lesion or rash.  Neurological:     General: No focal deficit present.     Mental Status: She is alert and oriented to person, place, and time. Mental status is at baseline.  Psychiatric:        Mood and Affect: Mood normal.        Behavior: Behavior normal.        Thought Content: Thought content normal.        Judgment: Judgment normal.     Results for orders placed or performed in visit on 02/01/23  Lipid Panel w/o Chol/HDL Ratio  Result Value Ref Range   Cholesterol, Total 177 100 - 199 mg/dL   Triglycerides 161 0 - 149 mg/dL   HDL 38 (L) >09 mg/dL   VLDL Cholesterol Cal 25 5 - 40 mg/dL   LDL Chol Calc (NIH) 604 (H) 0 - 99 mg/dL  Vitamin D (25 hydroxy)  Result Value Ref Range   Vit D, 25-Hydroxy 62.8 30.0 - 100.0 ng/mL  Vitamin B12  Result  Value Ref Range   Vitamin B-12 360 232 - 1,245 pg/mL  Comp Met (CMET)  Result Value Ref Range    Glucose 78 70 - 99 mg/dL   BUN 12 8 - 27 mg/dL   Creatinine, Ser 6.57 0.57 - 1.00 mg/dL   eGFR 85 >84 ON/GEX/5.28   BUN/Creatinine Ratio 16 12 - 28   Sodium 139 134 - 144 mmol/L   Potassium 3.9 3.5 - 5.2 mmol/L   Chloride 105 96 - 106 mmol/L   CO2 25 20 - 29 mmol/L   Calcium 8.5 (L) 8.7 - 10.3 mg/dL   Total Protein 6.4 6.0 - 8.5 g/dL   Albumin 4.0 3.8 - 4.8 g/dL   Globulin, Total 2.4 1.5 - 4.5 g/dL   Albumin/Globulin Ratio 1.7 1.2 - 2.2   Bilirubin Total <0.2 0.0 - 1.2 mg/dL   Alkaline Phosphatase 93 44 - 121 IU/L   AST 13 0 - 40 IU/L   ALT 8 0 - 32 IU/L  CBC With Diff/Platelet  Result Value Ref Range   WBC 5.1 3.4 - 10.8 x10E3/uL   RBC 3.25 (L) 3.77 - 5.28 x10E6/uL   Hemoglobin 10.9 (L) 11.1 - 15.9 g/dL   Hematocrit 41.3 (L) 24.4 - 46.6 %   MCV 100 (H) 79 - 97 fL   MCH 33.5 (H) 26.6 - 33.0 pg   MCHC 33.5 31.5 - 35.7 g/dL   RDW 01.0 (L) 27.2 - 53.6 %   Platelets 346 150 - 450 x10E3/uL   Neutrophils 56 Not Estab. %   Lymphs 35 Not Estab. %   Monocytes 5 Not Estab. %   Eos 4 Not Estab. %   Basos 0 Not Estab. %   Neutrophils Absolute 2.8 1.4 - 7.0 x10E3/uL   Lymphocytes Absolute 1.8 0.7 - 3.1 x10E3/uL   Monocytes Absolute 0.2 0.1 - 0.9 x10E3/uL   EOS (ABSOLUTE) 0.2 0.0 - 0.4 x10E3/uL   Basophils Absolute 0.0 0.0 - 0.2 x10E3/uL   Immature Granulocytes 0 Not Estab. %   Immature Grans (Abs) 0.0 0.0 - 0.1 x10E3/uL  TSH  Result Value Ref Range   TSH 0.850 0.450 - 4.500 uIU/mL      Assessment & Plan:   Problem List Items Addressed This Visit       Other   Depression    Not doing well with recent losses. Not sleeping. Will increase her seroquel to 100mg  for 1 week, then increase to 150mg . Follow up 2 weeks. Call with any concerns.       Other Visit Diagnoses     Chronic right shoulder pain    -  Primary   Arm hurting much more again. Toradol shot given today and appointment scheduled with ortho for Thursday. Call with any concerns.   Relevant Medications    ketorolac (TORADOL) injection 30 mg (Completed)   Other Relevant Orders   Ambulatory referral to Orthopedic Surgery        Follow up plan: Return in about 2 weeks (around 03/22/2023).

## 2023-03-09 NOTE — Telephone Encounter (Signed)
Unable to refill per protocol, Rx requests are too soon.  Requested Prescriptions  Pending Prescriptions Disp Refills   levothyroxine (SYNTHROID) 25 MCG tablet [Pharmacy Med Name: LEVOTHYROXINE 25 MCG TABLET] 90 tablet 3    Sig: TAKE 1 TABLET BY MOUTH EVERY DAY BEFORE BREAKFAST     Endocrinology:  Hypothyroid Agents Passed - 03/08/2023 10:22 PM      Passed - TSH in normal range and within 360 days    TSH  Date Value Ref Range Status  02/01/2023 0.850 0.450 - 4.500 uIU/mL Final         Passed - Valid encounter within last 12 months    Recent Outpatient Visits           Yesterday Chronic right shoulder pain   [place] [facility] [place], Megan P, DO   1 month ago Fatigue due to depression   [place] [facility] Pearley, Sherran Needs, NP   6 months ago Anxiety   [place] [facility] [place], Megan P, DO   7 months ago Encounter for annual wellness visit (AWV) in Medicare patient   [place] [place], [place], DO   1 year ago Concussion with unknown loss of consciousness status, subsequent encounter   [place] [facility] [place], [place], DO       Future Appointments             In 1 week Johnson, Oralia Rud, DO [place] [facility]             traZODone (DESYREL) 100 MG tablet [Pharmacy Med Name: TRAZODONE 100 MG TABLET] 135 tablet 0    Sig: TAKE 1-1.5 TABLETS (100-150 MG TOTAL) BY MOUTH AT BEDTIME AS NEEDED. FOR SLEEP     Psychiatry: Antidepressants - Serotonin Modulator Passed - 03/08/2023 10:22 PM      Passed - Completed PHQ-2 or PHQ-9 in the last 360 days      Passed - Valid encounter within last 6 months    Recent Outpatient Visits           Yesterday Chronic right shoulder pain   [facility] [place], Megan P, DO   1 month ago Fatigue due to depression   [place] Pearley, Sherran Needs, NP   6  months ago Anxiety   [place] [facility] [place], Megan P, DO   7 months ago Encounter for annual wellness visit (AWV) in Medicare patient   [place] [facility] [place], [place], DO   1 year ago Concussion with unknown loss of consciousness status, subsequent encounter   [place] [facility] Dorcas Carrow, DO       Future Appointments             In 1 week Laural Benes, Oralia Rud, DO [place] [facility]             ondansetron (ZOFRAN) 4 MG tablet [Pharmacy Med Name: ONDANSETRON HCL 4 MG TABLET] 60 tablet 1    Sig: DISSOLVE 1 TABLET IN MOUTH AS NEEDED FOR NAUSEA UP TO 2 TIMES A DAY     Not Delegated - Gastroenterology: Antiemetics - ondansetron Failed - 03/08/2023 10:22 PM      Failed - This refill cannot be delegated      Passed - AST in normal range and within 360 days    AST  Date Value  Ref Range Status  02/01/2023 13 0 - 40 IU/L Final   AST (SGOT) Piccolo, Waived  Date Value Ref Range Status  11/12/2016 19 11 - 38 U/L Final         Passed - ALT in normal range and within 360 days    ALT  Date Value Ref Range Status  02/01/2023 8 0 - 32 IU/L Final   ALT (SGPT) Piccolo, Waived  Date Value Ref Range Status  11/12/2016 15 10 - 47 U/L Final         Passed - Valid encounter within last 6 months    Recent Outpatient Visits           Yesterday Chronic right shoulder pain   [place] [facility] [place], Megan P, DO   1 month ago Fatigue due to depression   [place] [facility] Pearley, Sherran Needs, NP   6 months ago Anxiety   [place] [facility] [place], Megan P, DO   7 months ago Encounter for annual wellness visit (AWV) in Medicare patient   [place] [facility] [place], [place], DO   1 year ago Concussion with unknown loss of consciousness status, subsequent encounter   [place] [facility] Dorcas Carrow, DO        Future Appointments             In 1 week Laural Benes, Oralia Rud, DO [place] [facility]

## 2023-03-10 ENCOUNTER — Other Ambulatory Visit: Payer: Self-pay | Admitting: Family Medicine

## 2023-03-11 NOTE — Telephone Encounter (Signed)
Requested medication (s) are due for refill today:   No for 2, provider to review 1  Requested medication (s) are on the active medication list:   Yes for all 3  Future visit scheduled:   Yes 6/24   Last ordered: Zofran 01/08/2023 #60, 1 refill;   Trazodone 01/12/2023 #135, 0 refills;   Synthroid 08/03/2022 #90, 3 refills  Returned because got duplicate requests for these.   Refused on 03/09/2023 due to being requested too soon.   Returned due to non delegated medication.      Requested Prescriptions  Pending Prescriptions Disp Refills   ondansetron (ZOFRAN) 4 MG tablet [Pharmacy Med Name: ONDANSETRON HCL 4 MG TABLET] 60 tablet 1    Sig: DISSOLVE 1 TABLET IN MOUTH AS NEEDED FOR NAUSEA UP TO 2 TIMES A DAY     Not Delegated - Gastroenterology: Antiemetics - ondansetron Failed - 03/10/2023 12:44 PM      Failed - This refill cannot be delegated      Passed - AST in normal range and within 360 days    AST  Date Value Ref Range Status  02/01/2023 13 0 - 40 IU/L Final   AST (SGOT) Piccolo, Waived  Date Value Ref Range Status  11/12/2016 19 11 - 38 U/L Final         Passed - ALT in normal range and within 360 days    ALT  Date Value Ref Range Status  02/01/2023 8 0 - 32 IU/L Final   ALT (SGPT) Piccolo, Waived  Date Value Ref Range Status  11/12/2016 15 10 - 47 U/L Final         Passed - Valid encounter within last 6 months    Recent Outpatient Visits           3 days ago Chronic right shoulder pain   [place] [facility] [place], Megan P, DO   1 month ago Fatigue due to depression   [place] [facility] Pearley, Sherran Needs, NP   6 months ago Anxiety   [place] [facility] [place], Megan P, DO   7 months ago Encounter for annual wellness visit (AWV) in Medicare patient   [place] [facility] [place], [place], DO   1 year ago Concussion with unknown loss of consciousness status, subsequent encounter   [facility] [place], [place], DO       Future Appointments             In 1 week Johnson, Oralia Rud, DO [place] [facility]             traZODone (DESYREL) 100 MG tablet [Pharmacy Med Name: TRAZODONE 100 MG TABLET] 135 tablet 0    Sig: TAKE 1-1.5 TABLETS (100-150 MG TOTAL) BY MOUTH AT BEDTIME AS NEEDED. FOR SLEEP     Psychiatry: Antidepressants - Serotonin Modulator Passed - 03/10/2023 12:44 PM      Passed - Completed PHQ-2 or PHQ-9 in the last 360 days      Passed - Valid encounter within last 6 months    Recent Outpatient Visits           3 days ago Chronic right shoulder pain   [place] [facility] [place], Megan P, DO   1 month ago Fatigue due to depression   [place] [place], NP   6 months ago Anxiety     [facility] [place], Megan P, DO   7 months ago Encounter for annual wellness visit (AWV) in Medicare patient   [place] [place], [place], DO   1 year ago Concussion with unknown loss of consciousness status, subsequent encounter   [place] [facility] Dorcas Carrow, DO       Future Appointments             In 1 week Laural Benes, Oralia Rud, DO [place] [facility]             levothyroxine (SYNTHROID) 25 MCG tablet [Pharmacy Med Name: LEVOTHYROXINE 25 MCG TABLET] 90 tablet 3    Sig: TAKE 1 TABLET BY MOUTH EVERY DAY BEFORE BREAKFAST     Endocrinology:  Hypothyroid Agents Passed - 03/10/2023 12:44 PM      Passed - TSH in normal range and within 360 days    TSH  Date Value Ref Range Status  02/01/2023 0.850 0.450 - 4.500 uIU/mL Final         Passed - Valid encounter within last 12 months    Recent Outpatient Visits           3 days ago Chronic right shoulder pain   [place] [facility] [place], Megan P, DO   1 month ago Fatigue due to depression   [facility] Pearley, Sherran Needs, NP   6 months ago Anxiety   [place] [place], Megan P, DO   7 months ago Encounter for annual wellness visit (AWV) in Medicare patient   [place] [facility] [place], [place], DO   1 year ago Concussion with unknown loss of consciousness status, subsequent encounter   [place] [facility] Dorcas Carrow, DO       Future Appointments             In 1 week Laural Benes, Oralia Rud, DO  [facility], [facility]

## 2023-03-14 ENCOUNTER — Other Ambulatory Visit: Payer: Self-pay | Admitting: Family Medicine

## 2023-03-14 DIAGNOSIS — T7840XD Allergy, unspecified, subsequent encounter: Secondary | ICD-10-CM

## 2023-03-15 NOTE — Telephone Encounter (Signed)
Unable to refill per protocol, Rx requests are too soon, duplicate request.  Requested Prescriptions  Pending Prescriptions Disp Refills   traZODone (DESYREL) 100 MG tablet [Pharmacy Med Name: TRAZODONE 100 MG TABLET] 135 tablet 0    Sig: TAKE 1-1.5 TABLETS (100-150 MG TOTAL) BY MOUTH AT BEDTIME AS NEEDED. FOR SLEEP     Psychiatry: Antidepressants - Serotonin Modulator Passed - 03/14/2023 10:41 AM      Passed - Completed PHQ-2 or PHQ-9 in the last 360 days      Passed - Valid encounter within last 6 months    Recent Outpatient Visits           1 week ago Chronic right shoulder pain   [place] [facility] [place], Megan P, DO   1 month ago Fatigue due to depression   [place] [facility] Pearley, Sherran Needs, NP   6 months ago Anxiety   [place] [facility] [place], Megan P, DO   7 months ago Encounter for annual wellness visit (AWV) in Medicare patient   [place] [place], [place], DO   1 year ago Concussion with unknown loss of consciousness status, subsequent encounter   [facility] Dorcas Carrow, DO       Future Appointments             In 1 week Laural Benes, Oralia Rud, DO [place] [facility]             levothyroxine (SYNTHROID) 25 MCG tablet [Pharmacy Med Name: LEVOTHYROXINE 25 MCG TABLET] 90 tablet 3    Sig: TAKE 1 TABLET BY MOUTH EVERY DAY BEFORE BREAKFAST     Endocrinology:  Hypothyroid Agents Passed - 03/14/2023 10:41 AM      Passed - TSH in normal range and within 360 days    TSH  Date Value Ref Range Status  02/01/2023 0.850 0.450 - 4.500 uIU/mL Final         Passed - Valid encounter within last 12 months    Recent Outpatient Visits           1 week ago Chronic right shoulder pain   [facility] [place], Megan P, DO   1 month ago Fatigue due to depression   [place] [facility] Pearley,  Sherran Needs, NP   6 months ago Anxiety   [place] [place], Megan P, DO   7 months ago Encounter for annual wellness visit (AWV) in Medicare patient   [place] [facility] [place], [place], DO   1 year ago Concussion with unknown loss of consciousness status, subsequent encounter   [place] [facility] [place], Oralia Rud, DO       Future Appointments             In 1 week Laural Benes, Oralia Rud, DO [place] [facility]             ondansetron (ZOFRAN) 4 MG tablet [Pharmacy Med Name: ONDANSETRON HCL 4 MG TABLET] 60 tablet 1    Sig: DISSOLVE 1 TABLET IN MOUTH AS NEEDED FOR NAUSEA UP TO 2 TIMES A DAY     Not Delegated - Gastroenterology: Antiemetics - ondansetron Failed - 03/14/2023 10:41 AM      Failed - This refill cannot be delegated      Passed - AST in normal range and within 360 days  AST  Date Value Ref Range Status  02/01/2023 13 0 - 40 IU/L Final   AST (SGOT) Piccolo, Waived  Date Value Ref Range Status  11/12/2016 19 11 - 38 U/L Final         Passed - ALT in normal range and within 360 days    ALT  Date Value Ref Range Status  02/01/2023 8 0 - 32 IU/L Final   ALT (SGPT) Piccolo, Waived  Date Value Ref Range Status  11/12/2016 15 10 - 47 U/L Final         Passed - Valid encounter within last 6 months    Recent Outpatient Visits           1 week ago Chronic right shoulder pain   [place] [facility] [place], Megan P, DO   1 month ago Fatigue due to depression   [place] [facility] Pearley, Sherran Needs, NP   6 months ago Anxiety   [place] [facility] [place], Megan P, DO   7 months ago Encounter for annual wellness visit (AWV) in Medicare patient   [place] [facility] [place], [place], DO   1 year ago Concussion with unknown loss of consciousness status, subsequent encounter   [place] [facility] Dorcas Carrow, DO       Future Appointments             In 1 week Laural Benes, Oralia Rud, DO [place] [facility]

## 2023-03-15 NOTE — Telephone Encounter (Signed)
Unable to refill per protocol, Rx expired. Discontinued 03/08/23.  Requested Prescriptions  Pending Prescriptions Disp Refills   benzonatate (TESSALON) 200 MG capsule [Pharmacy Med Name: BENZONATATE 200 MG CAPSULE] 20 capsule 0    Sig: TAKE 1 CAPSULE BY MOUTH 2 TIMES DAILY AS NEEDED FOR COUGH.     Ear, Nose, and Throat:  Antitussives/Expectorants Passed - 03/14/2023 10:41 AM      Passed - Valid encounter within last 12 months    Recent Outpatient Visits           1 week ago Chronic right shoulder pain   [place] [facility] [place], Megan P, DO   1 month ago Fatigue due to depression   [place] [facility] Pearley, Sherran Needs, NP   6 months ago Anxiety   [place] [facility] [place], Megan P, DO   7 months ago Encounter for annual wellness visit (AWV) in Medicare patient   [place] [facility] [place], [place], DO   1 year ago Concussion with unknown loss of consciousness status, subsequent encounter   [place] [facility] Dorcas Carrow, DO       Future Appointments             In 1 week Dorcas Carrow, DO [place], [facility]

## 2023-03-18 ENCOUNTER — Other Ambulatory Visit: Payer: Self-pay | Admitting: Family Medicine

## 2023-03-18 DIAGNOSIS — T7840XD Allergy, unspecified, subsequent encounter: Secondary | ICD-10-CM

## 2023-03-19 ENCOUNTER — Other Ambulatory Visit: Payer: Self-pay | Admitting: Family Medicine

## 2023-03-19 NOTE — Telephone Encounter (Signed)
Unable to refill per protocol, Rx expired. Discontinued 03/08/23.  Requested Prescriptions  Pending Prescriptions Disp Refills   benzonatate (TESSALON) 200 MG capsule [Pharmacy Med Name: BENZONATATE 200 MG CAPSULE] 20 capsule 0    Sig: TAKE 1 CAPSULE BY MOUTH 2 TIMES DAILY AS NEEDED FOR COUGH.     Ear, Nose, and Throat:  Antitussives/Expectorants Passed - 03/18/2023  7:59 PM      Passed - Valid encounter within last 12 months    Recent Outpatient Visits           1 week ago Chronic right shoulder pain   [place] [facility] [place], Megan P, DO   1 month ago Fatigue due to depression   [place] [facility] Pearley, Sherran Needs, NP   6 months ago Anxiety   [place] [facility] [place], Megan P, DO   7 months ago Encounter for annual wellness visit (AWV) in Medicare patient   [place] [facility] [place], [place], DO   1 year ago Concussion with unknown loss of consciousness status, subsequent encounter   [place] [facility] Dorcas Carrow, DO       Future Appointments             In 1 week Dorcas Carrow, DO [place] [facility]

## 2023-03-19 NOTE — Telephone Encounter (Signed)
Requested medication (s) are due for refill today:   No for 2 of them.   Requested too soon.   Zofran provider to review - non delegated  Requested medication (s) are on the active medication list:   Yes for all 3  Future visit scheduled:   Yes  03/26/2023     Last ordered: Trazodone 01/12/2023 #135, 0 refills;   Synthroid 08/03/2022 #90, 3 refills;   Zofran 01/08/2023, #60, 1 refill  Non delegated   Being requested too soon.    Zofran provider to review - non delegated   Requested Prescriptions  Pending Prescriptions Disp Refills   traZODone (DESYREL) 100 MG tablet [Pharmacy Med Name: TRAZODONE 100 MG TABLET] 135 tablet 0    Sig: TAKE 1-1.5 TABLETS (100-150 MG TOTAL) BY MOUTH AT BEDTIME AS NEEDED. FOR SLEEP     Psychiatry: Antidepressants - Serotonin Modulator Passed - 03/18/2023  7:59 PM      Passed - Completed PHQ-2 or PHQ-9 in the last 360 days      Passed - Valid encounter within last 6 months    Recent Outpatient Visits           1 week ago Chronic right shoulder pain   [place] [facility] [place], Megan P, DO   1 month ago Fatigue due to depression   [place] [facility] Pearley, Sherran Needs, NP   6 months ago Anxiety   [place] [facility] [place], Megan P, DO   7 months ago Encounter for annual wellness visit (AWV) in Medicare patient   [place] [facility] [place], [place], DO   1 year ago Concussion with unknown loss of consciousness status, subsequent encounter   [place] [facility] Dorcas Carrow, DO       Future Appointments             In 1 week Laural Benes, Oralia Rud, DO [place] [facility]             levothyroxine (SYNTHROID) 25 MCG tablet [Pharmacy Med Name: LEVOTHYROXINE 25 MCG TABLET] 90 tablet 3    Sig: TAKE 1 TABLET BY MOUTH EVERY DAY BEFORE BREAKFAST     Endocrinology:  Hypothyroid Agents Passed - 03/18/2023  7:59 PM      Passed - TSH in normal  range and within 360 days    TSH  Date Value Ref Range Status  02/01/2023 0.850 0.450 - 4.500 uIU/mL Final         Passed - Valid encounter within last 12 months    Recent Outpatient Visits           1 week ago Chronic right shoulder pain   [place] [facility], Megan P, DO   1 month ago Fatigue due to depression   [place] [facility] Pearley, Sherran Needs, NP   6 months ago Anxiety   [place] [facility] [place], Megan P, DO   7 months ago Encounter for annual wellness visit (AWV) in Medicare patient   [facility] [place], [place], DO   1 year ago Concussion with unknown loss of consciousness status, subsequent encounter   [place] Dorcas Carrow, DO       Future Appointments             In 1 week Laural Benes, Oralia Rud, DO [place] [facility]  ondansetron (ZOFRAN) 4 MG tablet [Pharmacy Med Name: ONDANSETRON HCL 4 MG TABLET] 60 tablet 1    Sig: DISSOLVE 1 TABLET IN MOUTH AS NEEDED FOR NAUSEA UP TO 2 TIMES A DAY     Not Delegated - Gastroenterology: Antiemetics - ondansetron Failed - 03/18/2023  7:59 PM      Failed - This refill cannot be delegated      Passed - AST in normal range and within 360 days    AST  Date Value Ref Range Status  02/01/2023 13 0 - 40 IU/L Final   AST (SGOT) Piccolo, Waived  Date Value Ref Range Status  11/12/2016 19 11 - 38 U/L Final         Passed - ALT in normal range and within 360 days    ALT  Date Value Ref Range Status  02/01/2023 8 0 - 32 IU/L Final   ALT (SGPT) Piccolo, Waived  Date Value Ref Range Status  11/12/2016 15 10 - 47 U/L Final         Passed - Valid encounter within last 6 months    Recent Outpatient Visits           1 week ago Chronic right shoulder pain   [place] [facility] [place], Megan P, DO   1 month ago Fatigue due to depression   [place]  [facility] Pearley, Sherran Needs, NP   6 months ago Anxiety   [place] [facility] [place], Megan P, DO   7 months ago Encounter for annual wellness visit (AWV) in Medicare patient   [facility] [place], [place], DO   1 year ago Concussion with unknown loss of consciousness status, subsequent encounter   [place] [facility] Dorcas Carrow, DO       Future Appointments             In 1 week Laural Benes, Oralia Rud, DO [place] [facility]

## 2023-03-19 NOTE — Telephone Encounter (Signed)
Medication Refill - Medication: traZODone HCl 100-150 mg  Denied because says refill too soon Pt says she has a weeks worth left, she takes 150 a day  Has the patient contacted their pharmacy? yes (Agent: If no, request that the patient contact the pharmacy for the refill. If patient does not wish to contact the pharmacy document the reason why and proceed with request.) (Agent: If yes, when and what did the pharmacy advise?)contact pcp  Preferred Pharmacy (with phone number or street name):  [id number] [place] [place] - [address] Phone: [phone]  Fax: [phone]     Has the patient been seen for an appointment in the last year OR does the patient have an upcoming appointment? yes  Agent: Please be advised that RX refills may take up to 3 business days. We ask that you follow-up with your pharmacy.

## 2023-03-19 NOTE — Telephone Encounter (Signed)
Called pt - LMOM. Med was refilled on 01/12/2023  #135 0 rf, at the same pharmacy that pt is requesting refill be sent to. Receipt by pharmacy confirmed.  Called pt requesting call back to see if this rx was picked up, or whether she is out of medication. Pt should have enough to last until 7/16.

## 2023-03-22 ENCOUNTER — Ambulatory Visit: Payer: Medicare HMO | Admitting: Family Medicine

## 2023-03-22 NOTE — Telephone Encounter (Signed)
Requested medications are due for refill today.  Unsure  Requested medications are on the active medications list.  yes  Last refill. 01/12/2023 #135 0 rf  Future visit scheduled.   yes  Notes to clinic.  Called pt - LMOM regarding medication. Pt should have enough to last until July. Please review for refill.    Requested Prescriptions  Pending Prescriptions Disp Refills   traZODone (DESYREL) 100 MG tablet 135 tablet 0    Sig: Take 1-1.5 tablets (100-150 mg total) by mouth at bedtime as needed. for sleep     Psychiatry: Antidepressants - Serotonin Modulator Passed - 03/19/2023  3:59 PM      Passed - Completed PHQ-2 or PHQ-9 in the last 360 days      Passed - Valid encounter within last 6 months    Recent Outpatient Visits           2 weeks ago Chronic right shoulder pain   [place] [facility] [place], Megan P, DO   1 month ago Fatigue due to depression   [place] [facility] Pearley, Sherran Needs, NP   6 months ago Anxiety   [place] [facility] [address], Megan P, DO   7 months ago Encounter for annual wellness visit (AWV) in Medicare patient   [place] [facility] [place], [place], DO   1 year ago Concussion with unknown loss of consciousness status, subsequent encounter   [place] [facility] Dorcas Carrow, DO       Future Appointments             In 4 days Dorcas Carrow, DO  [facility]

## 2023-03-24 ENCOUNTER — Other Ambulatory Visit: Payer: Self-pay | Admitting: Family Medicine

## 2023-03-25 ENCOUNTER — Other Ambulatory Visit: Payer: Self-pay | Admitting: Family Medicine

## 2023-03-25 NOTE — Telephone Encounter (Signed)
Requested medication (s) are due for refill today:   Provider to review  Requested medication (s) are on the active medication list:   Yes  Future visit scheduled:   Yes tomorrow 6/28 with Dr. Laural Benes   Last ordered: 01/08/2023 #60, 1 refill  Returned because this is a non delegated refill.     Requested Prescriptions  Pending Prescriptions Disp Refills   ondansetron (ZOFRAN) 4 MG tablet [Pharmacy Med Name: ONDANSETRON HCL 4 MG TABLET] 60 tablet 1    Sig: DISSOLVE 1 TABLET IN MOUTH AS NEEDED FOR NAUSEA UP TO 2 TIMES A DAY     Not Delegated - Gastroenterology: Antiemetics - ondansetron Failed - 03/24/2023  9:44 AM      Failed - This refill cannot be delegated      Passed - AST in normal range and within 360 days    AST  Date Value Ref Range Status  02/01/2023 13 0 - 40 IU/L Final   AST (SGOT) Piccolo, Waived  Date Value Ref Range Status  11/12/2016 19 11 - 38 U/L Final         Passed - ALT in normal range and within 360 days    ALT  Date Value Ref Range Status  02/01/2023 8 0 - 32 IU/L Final   ALT (SGPT) Piccolo, Waived  Date Value Ref Range Status  11/12/2016 15 10 - 47 U/L Final         Passed - Valid encounter within last 6 months    Recent Outpatient Visits           2 weeks ago Chronic right shoulder pain   [place] [facility] [place], Megan P, DO   1 month ago Fatigue due to depression   [place] [facility] Pearley, Sherran Needs, NP   6 months ago Anxiety   [place] [facility] [place], Megan P, DO   7 months ago Encounter for annual wellness visit (AWV) in Medicare patient   [place] [place], [place], DO   1 year ago Concussion with unknown loss of consciousness status, subsequent encounter   [place] [facility] [place], Oralia Rud, DO       Future Appointments             Tomorrow Dorcas Carrow, DO [place] [facility]

## 2023-03-26 ENCOUNTER — Other Ambulatory Visit: Payer: Self-pay

## 2023-03-26 ENCOUNTER — Telehealth (INDEPENDENT_AMBULATORY_CARE_PROVIDER_SITE_OTHER): Payer: Medicare HMO | Admitting: Family Medicine

## 2023-03-26 ENCOUNTER — Encounter: Payer: Self-pay | Admitting: Family Medicine

## 2023-03-26 VITALS — BP 116/70 | HR 72

## 2023-03-26 DIAGNOSIS — M25572 Pain in left ankle and joints of left foot: Secondary | ICD-10-CM

## 2023-03-26 DIAGNOSIS — F419 Anxiety disorder, unspecified: Secondary | ICD-10-CM | POA: Diagnosis not present

## 2023-03-26 MED ORDER — GABAPENTIN 300 MG PO CAPS: ORAL_CAPSULE | ORAL | 1 refills | Status: DC

## 2023-03-26 MED ORDER — CYCLOBENZAPRINE HCL 5 MG PO TABS: ORAL_TABLET | ORAL | 1 refills | Status: DC

## 2023-03-26 MED ORDER — ALBUTEROL SULFATE HFA 108 (90 BASE) MCG/ACT IN AERS: INHALATION_SPRAY | RESPIRATORY_TRACT | 1 refills | Status: DC

## 2023-03-26 MED ORDER — PANTOPRAZOLE SODIUM 40 MG PO TBEC: 40.0000 mg | DELAYED_RELEASE_TABLET | Freq: Two times a day (BID) | ORAL | 1 refills | Status: DC

## 2023-03-26 MED ORDER — ROSUVASTATIN CALCIUM 10 MG PO TABS: 10.0000 mg | ORAL_TABLET | Freq: Every day | ORAL | 1 refills | Status: DC

## 2023-03-26 MED ORDER — QUETIAPINE FUMARATE ER 150 MG PO TB24: 150.0000 mg | ORAL_TABLET | Freq: Every day | ORAL | 1 refills | Status: DC

## 2023-03-26 MED ORDER — TRAZODONE HCL 100 MG PO TABS: 100.0000 mg | ORAL_TABLET | Freq: Every evening | ORAL | 1 refills | Status: DC | PRN

## 2023-03-26 NOTE — Assessment & Plan Note (Signed)
Doing much better on her seroquel. Stable. Continue current regimen. Continue to monitor. Call with any concerns.

## 2023-03-26 NOTE — Progress Notes (Signed)
BP 116/70 Comment: pt obtained  Pulse 72   SpO2 98%    Subjective:    Patient ID: Annette Miller, female    DOB: 15-Sep-1951, 72 y.o.   MRN: [id number]  HPI: Annette Miller is a 72 y.o. female  Chief Complaint  Patient presents with   Anxiety    Increased seroquel. Pt states that everything is okay requesting refill on trazodone.   ANXIETY/STRESS Duration: chronic Status:better Anxious mood: yes  Excessive worrying: yes Irritability: no  Sweating: no Nausea: no Palpitations:no Hyperventilation: no Panic attacks: no Agoraphobia: no  Obscessions/compulsions: no Depressed mood: no    03/26/2023   10:45 AM 02/01/2023    3:44 PM 09/04/2022    3:37 PM 07/30/2022    2:02 PM 02/09/2022    2:00 PM  Depression screen PHQ 2/9  Decreased Interest 0 1 1 1  0  Down, Depressed, Hopeless 1 2 1 3  0  PHQ - 2 Score 1 3 2 4  0  Altered sleeping 3 3 2 3 3   Tired, decreased energy 0 3 1 2 2   Change in appetite 0 2 2 3 1   Feeling bad or failure about yourself  0 2 1 1  0  Trouble concentrating 0 1 1 2  0  Moving slowly or fidgety/restless 1 1 0 0 1  Suicidal thoughts 0 0 0 0 0  PHQ-9 Score 5 15 9 15 7   Difficult doing work/chores Not difficult at all Not difficult at all Somewhat difficult        03/26/2023   10:47 AM 03/08/2023    3:36 PM 02/01/2023    3:45 PM 09/04/2022    3:37 PM  GAD 7 : Generalized Anxiety Score  Nervous, Anxious, on Edge 1 2 3 2   Control/stop worrying 0 3 3 2   Worry too much - different things 0 3 3 2   Trouble relaxing 2 3 2 2   Restless 0 3 2 2   Easily annoyed or irritable 0 3 2 1   Afraid - awful might happen 0 1 1 1   Total GAD 7 Score 3 18 16 12   Anxiety Difficulty Somewhat difficult  Not difficult at all Somewhat difficult   Anhedonia: no Weight changes: no Insomnia: no   Hypersomnia: no Fatigue/loss of energy: no Feelings of worthlessness: no Feelings of guilt: no Impaired concentration/indecisiveness: no Suicidal ideations: no  Crying spells:  no Recent Stressors/Life Changes: no   Relationship problems: no   Family stress: no     Financial stress: no    Job stress: no    Recent death/loss: no   Relevant past medical, surgical, family and social history reviewed and updated as indicated. Interim medical history since our last visit reviewed. Allergies and medications reviewed and updated.  Review of Systems  Constitutional: Negative.   Respiratory: Negative.    Cardiovascular: Negative.   Gastrointestinal: Negative.   Musculoskeletal: Negative.   Neurological: Negative.   Psychiatric/Behavioral: Negative.      Per HPI unless specifically indicated above     Objective:    BP 116/70 Comment: pt obtained  Pulse 72   SpO2 98%   Wt Readings from Last 3 Encounters:  03/08/23 103 lb 3.2 oz (46.8 kg)  02/01/23 99 lb 3.2 oz (45 kg)  09/04/22 95 lb 1.6 oz (43.1 kg)    Physical Exam Vitals and nursing note reviewed.  Constitutional:      General: She is not in acute distress.    Appearance: Normal appearance. She is normal  weight. She is not ill-appearing, toxic-appearing or diaphoretic.  HENT:     Head: Normocephalic and atraumatic.     Right Ear: External ear normal.     Left Ear: External ear normal.     Nose: Nose normal.     Mouth/Throat:     Mouth: Mucous membranes are moist.     Pharynx: Oropharynx is clear.  Eyes:     General: No scleral icterus.       Right eye: No discharge.        Left eye: No discharge.     Conjunctiva/sclera: Conjunctivae normal.     Pupils: Pupils are equal, round, and reactive to light.  Pulmonary:     Effort: Pulmonary effort is normal. No respiratory distress.     Comments: Speaking in full sentences Musculoskeletal:        General: Normal range of motion.     Cervical back: Normal range of motion.  Skin:    Coloration: Skin is not jaundiced or pale.     Findings: No bruising, erythema, lesion or rash.  Neurological:     Mental Status: She is alert and oriented to  person, place, and time. Mental status is at baseline.  Psychiatric:        Mood and Affect: Mood normal.        Behavior: Behavior normal.        Thought Content: Thought content normal.        Judgment: Judgment normal.     Results for orders placed or performed in visit on 02/01/23  Lipid Panel w/o Chol/HDL Ratio  Result Value Ref Range   Cholesterol, Total 177 100 - 199 mg/dL   Triglycerides 409 0 - 149 mg/dL   HDL 38 (L) >81 mg/dL   VLDL Cholesterol Cal 25 5 - 40 mg/dL   LDL Chol Calc (NIH) 191 (H) 0 - 99 mg/dL  Vitamin D (25 hydroxy)  Result Value Ref Range   Vit D, 25-Hydroxy 62.8 30.0 - 100.0 ng/mL  Vitamin B12  Result Value Ref Range   Vitamin B-12 360 232 - 1,245 pg/mL  Comp Met (CMET)  Result Value Ref Range   Glucose 78 70 - 99 mg/dL   BUN 12 8 - 27 mg/dL   Creatinine, Ser 4.78 0.57 - 1.00 mg/dL   eGFR 85 >29 FA/OZH/0.86   BUN/Creatinine Ratio 16 12 - 28   Sodium 139 134 - 144 mmol/L   Potassium 3.9 3.5 - 5.2 mmol/L   Chloride 105 96 - 106 mmol/L   CO2 25 20 - 29 mmol/L   Calcium 8.5 (L) 8.7 - 10.3 mg/dL   Total Protein 6.4 6.0 - 8.5 g/dL   Albumin 4.0 3.8 - 4.8 g/dL   Globulin, Total 2.4 1.5 - 4.5 g/dL   Albumin/Globulin Ratio 1.7 1.2 - 2.2   Bilirubin Total <0.2 0.0 - 1.2 mg/dL   Alkaline Phosphatase 93 44 - 121 IU/L   AST 13 0 - 40 IU/L   ALT 8 0 - 32 IU/L  CBC With Diff/Platelet  Result Value Ref Range   WBC 5.1 3.4 - 10.8 x10E3/uL   RBC 3.25 (L) 3.77 - 5.28 x10E6/uL   Hemoglobin 10.9 (L) 11.1 - 15.9 g/dL   Hematocrit 57.8 (L) 46.9 - 46.6 %   MCV 100 (H) 79 - 97 fL   MCH 33.5 (H) 26.6 - 33.0 pg   MCHC 33.5 31.5 - 35.7 g/dL   RDW 62.9 (L) 52.8 - 41.3 %  Platelets 346 150 - 450 x10E3/uL   Neutrophils 56 Not Estab. %   Lymphs 35 Not Estab. %   Monocytes 5 Not Estab. %   Eos 4 Not Estab. %   Basos 0 Not Estab. %   Neutrophils Absolute 2.8 1.4 - 7.0 x10E3/uL   Lymphocytes Absolute 1.8 0.7 - 3.1 x10E3/uL   Monocytes Absolute 0.2 0.1 - 0.9  x10E3/uL   EOS (ABSOLUTE) 0.2 0.0 - 0.4 x10E3/uL   Basophils Absolute 0.0 0.0 - 0.2 x10E3/uL   Immature Granulocytes 0 Not Estab. %   Immature Grans (Abs) 0.0 0.0 - 0.1 x10E3/uL  TSH  Result Value Ref Range   TSH 0.850 0.450 - 4.500 uIU/mL      Assessment & Plan:   Problem List Items Addressed This Visit       Other   Anxiety - Primary    Doing much better on her seroquel. Stable. Continue current regimen. Continue to monitor. Call with any concerns.       Relevant Medications   traZODone (DESYREL) 100 MG tablet   Acute left ankle pain   Relevant Medications   cyclobenzaprine (FLEXERIL) 5 MG tablet     Follow up plan: Return in about 5 months (around 08/26/2023) for physical .    This visit was completed via video visit through MyChart due to the restrictions of the COVID-19 pandemic. All issues as above were discussed and addressed. Physical exam was done as above through visual confirmation on video through MyChart. If it was felt that the patient should be evaluated in the office, they were directed there. The patient verbally consented to this visit. Location of the patient: home Location of the provider: work Those involved with this call:  Provider: Olevia Perches, DO CMA:  Maggie Font, CMA Front Desk/Registration:  Servando Snare   Time spent on call:  15 minutes with patient face to face via video conference. More than 50% of this time was spent in counseling and coordination of care. 23 minutes total spent in review of patient's record and preparation of their chart.

## 2023-03-26 NOTE — Telephone Encounter (Signed)
Requested Prescriptions  Refused Prescriptions Disp Refills   levothyroxine (SYNTHROID) 25 MCG tablet [Pharmacy Med Name: LEVOTHYROXINE 25 MCG TABLET] 90 tablet 3    Sig: TAKE 1 TABLET BY MOUTH EVERY DAY BEFORE BREAKFAST     Endocrinology:  Hypothyroid Agents Passed - 03/25/2023 11:32 AM      Passed - TSH in normal range and within 360 days    TSH  Date Value Ref Range Status  02/01/2023 0.850 0.450 - 4.500 uIU/mL Final         Passed - Valid encounter within last 12 months    Recent Outpatient Visits           Today Acute left ankle pain   [place] [facility] [place], Megan P, DO   2 weeks ago Chronic right shoulder pain   [place] [facility] [place], Megan P, DO   1 month ago Fatigue due to depression   [place] [facility] Pearley, Sherran Needs, NP   6 months ago Anxiety   [place] [facility] [place], Megan P, DO   7 months ago Encounter for annual wellness visit (AWV) in Medicare patient   [place] [facility] [place], [place], DO       Future Appointments             In 5 months Johnson, Megan P, DO [place] [facility]             traZODone (DESYREL) 100 MG tablet [Pharmacy Med Name: TRAZODONE 100 MG TABLET] 135 tablet 0    Sig: TAKE 1-1.5 TABLETS (100-150 MG TOTAL) BY MOUTH AT BEDTIME AS NEEDED. FOR SLEEP     Psychiatry: Antidepressants - Serotonin Modulator Passed - 03/25/2023 11:32 AM      Passed - Completed PHQ-2 or PHQ-9 in the last 360 days      Passed - Valid encounter within last 6 months    Recent Outpatient Visits           Today Acute left ankle pain   [place] [facility] [place], [place] P, DO   2 weeks ago Chronic right shoulder pain   [place] [facility] [place], Megan P, DO   1 month ago Fatigue due to depression   [place] [facility] Pearley, Sherran Needs, NP   6 months ago Anxiety    [place] [facility] [place], Megan P, DO   7 months ago Encounter for annual wellness visit (AWV) in Medicare patient   [place] [facility] [place], Mild[place], DO       Future Appointments             In 5 months Laural Benes, Oralia Rud, DO  [facility]

## 2023-03-26 NOTE — Progress Notes (Signed)
Appointment has been made a reminder has been sent and a CRM was done

## 2023-03-31 ENCOUNTER — Other Ambulatory Visit: Payer: Self-pay

## 2023-03-31 MED ORDER — LEVOTHYROXINE SODIUM 25 MCG PO TABS: 25.0000 ug | ORAL_TABLET | Freq: Every day | ORAL | 1 refills | Status: DC

## 2023-03-31 NOTE — Telephone Encounter (Signed)
Years supply sent in in in November. Should not be due. Please check with pharmacy to make sure it's available.

## 2023-03-31 NOTE — Telephone Encounter (Signed)
[facility] stated that RX is expired, new prescription is needed.

## 2023-04-06 ENCOUNTER — Other Ambulatory Visit: Payer: Self-pay | Admitting: Family Medicine

## 2023-04-06 DIAGNOSIS — T7840XD Allergy, unspecified, subsequent encounter: Secondary | ICD-10-CM

## 2023-04-06 NOTE — Telephone Encounter (Signed)
Unable to refill per protocol, Rx expired. Discontinued 03/08/23.  Requested Prescriptions  Pending Prescriptions Disp Refills   benzonatate (TESSALON) 200 MG capsule [Pharmacy Med Name: BENZONATATE 200 MG CAPSULE] 20 capsule 0    Sig: TAKE 1 CAPSULE BY MOUTH 2 TIMES DAILY AS NEEDED FOR COUGH.     Ear, Nose, and Throat:  Antitussives/Expectorants Passed - 04/06/2023  6:54 AM      Passed - Valid encounter within last 12 months    Recent Outpatient Visits           1 week ago Anxiety   [place] [facility] [place], Megan P, DO   4 weeks ago Chronic right shoulder pain   [place] [facility] [place], Megan P, DO   2 months ago Fatigue due to depression   [place] [facility] Pearley, Sherran Needs, NP   7 months ago Anxiety   [place] [facility] [place], Megan P, DO   8 months ago Encounter for annual wellness visit (AWV) in Medicare patient   [place] [facility] [place], Oralia Rud, DO       Future Appointments             In 4 months Laural Benes, Oralia Rud, DO  [facility]

## 2023-05-18 ENCOUNTER — Other Ambulatory Visit: Payer: Self-pay | Admitting: Family Medicine

## 2023-05-18 NOTE — Telephone Encounter (Signed)
Requested medication (s) are due for refill today - yes  Requested medication (s) are on the active medication list -yes  Future visit scheduled -yes  Last refill: 03/25/23 #60 1RF  Notes to clinic: non delegated Rx  Requested Prescriptions  Pending Prescriptions Disp Refills   ondansetron (ZOFRAN) 4 MG tablet [Pharmacy Med Name: ONDANSETRON HCL 4 MG TABLET] 60 tablet 1    Sig: DISSOLVE 1 TABLET IN MOUTH AS NEEDED FOR NAUSEA UP TO 2 TIMES A DAY     Not Delegated - Gastroenterology: Antiemetics - ondansetron Failed - 05/18/2023  1:29 AM      Failed - This refill cannot be delegated      Passed - AST in normal range and within 360 days    AST  Date Value Ref Range Status  02/01/2023 13 0 - 40 IU/L Final   AST (SGOT) Piccolo, Waived  Date Value Ref Range Status  11/12/2016 19 11 - 38 U/L Final         Passed - ALT in normal range and within 360 days    ALT  Date Value Ref Range Status  02/01/2023 8 0 - 32 IU/L Final   ALT (SGPT) Piccolo, Waived  Date Value Ref Range Status  11/12/2016 15 10 - 47 U/L Final         Passed - Valid encounter within last 6 months    Recent Outpatient Visits           1 month ago Anxiety   [place] [facility] [place], Megan P, DO   2 months ago Chronic right shoulder pain   [place] [facility] [place], Megan P, DO   3 months ago Fatigue due to depression   [place] [facility] Pearley, Sherran Needs, NP   8 months ago Anxiety   [place] [facility] [place], Megan P, DO   9 months ago Encounter for annual wellness visit (AWV) in Medicare patient   [place] [facility] [place], [place], DO       Future Appointments             In 3 months Johnson, Megan P, DO  [facility]               Requested Prescriptions  Pending Prescriptions Disp Refills   ondansetron (ZOFRAN) 4 MG tablet [Pharmacy Med Name: ONDANSETRON HCL  4 MG TABLET] 60 tablet 1    Sig: DISSOLVE 1 TABLET IN MOUTH AS NEEDED FOR NAUSEA UP TO 2 TIMES A DAY     Not Delegated - Gastroenterology: Antiemetics - ondansetron Failed - 05/18/2023  1:29 AM      Failed - This refill cannot be delegated      Passed - AST in normal range and within 360 days    AST  Date Value Ref Range Status  02/01/2023 13 0 - 40 IU/L Final   AST (SGOT) Piccolo, Waived  Date Value Ref Range Status  11/12/2016 19 11 - 38 U/L Final         Passed - ALT in normal range and within 360 days    ALT  Date Value Ref Range Status  02/01/2023 8 0 - 32 IU/L Final   ALT (SGPT) Piccolo, Waived  Date Value Ref Range Status  11/12/2016 15 10 - 47 U/L Final         Passed - Valid encounter within last 6 months  Recent Outpatient Visits           1 month ago Anxiety   [place] [facility] [place], [place] P, DO   2 months ago Chronic right shoulder pain   [place] [facility] [place], Megan P, DO   3 months ago Fatigue due to depression   [place] [facility], Sherran Needs, NP   8 months ago Anxiety   [place] [facility] [place], Megan P, DO   9 months ago Encounter for annual wellness visit (AWV) in Medicare patient   [place] [facility] [place], Oralia Rud, DO       Future Appointments             In 3 months Laural Benes, Oralia Rud, DO [place] [facility]

## 2023-06-13 ENCOUNTER — Other Ambulatory Visit: Payer: Self-pay | Admitting: Family Medicine

## 2023-06-13 DIAGNOSIS — M25572 Pain in left ankle and joints of left foot: Secondary | ICD-10-CM

## 2023-06-14 NOTE — Telephone Encounter (Signed)
Requested medications are due for refill today.  Pt is no longer taking Seroquel 50mg .   Requested medications are on the active medications list.  yes  Last refill. Both refilled 03/26/2023  Future visit scheduled.   yes  Notes to clinic.  Refill/refusal not delegated.    Requested Prescriptions  Pending Prescriptions Disp Refills   cyclobenzaprine (FLEXERIL) 5 MG tablet [Pharmacy Med Name: CYCLOBENZAPRINE 5 MG TABLET] 30 tablet 1    Sig: TAKE 1 TABLET BY MOUTH EVERYDAY AT BEDTIME     Not Delegated - Analgesics:  Muscle Relaxants Failed - 06/13/2023  6:47 AM      Failed - This refill cannot be delegated      Passed - Valid encounter within last 6 months    Recent Outpatient Visits           2 months ago Anxiety   [place] [facility] [place], Megan P, DO   3 months ago Chronic right shoulder pain   [place] [facility] [place], Megan P, DO   4 months ago Fatigue due to depression   [place] [facility] [place], Sherran Needs, NP   9 months ago Anxiety   [place] [facility] [place], Megan P, DO   10 months ago Encounter for annual wellness visit (AWV) in Medicare patient   [place] [facility] [place], [place], DO       Future Appointments             In 2 months Johnson, Megan P, DO [place] [facility]             QUEtiapine (SEROQUEL) 50 MG tablet [Pharmacy Med Name: QUETIAPINE FUMARATE 50 MG TAB] 30 tablet 0    Sig: TAKE 1 TABLET BY MOUTH EVERYDAY AT BEDTIME     Not Delegated - Psychiatry:  Antipsychotics - Second Generation (Atypical) - quetiapine Failed - 06/13/2023  6:47 AM      Failed - This refill cannot be delegated      Failed - Lipid Panel in normal range within the last 12 months    Cholesterol, Total  Date Value Ref Range Status  02/01/2023 177 100 - 199 mg/dL Final   Cholesterol Piccolo, Waived  Date Value Ref Range Status  11/12/2016 173  <200 mg/dL Final    Comment:                            Desirable                <200                         Borderline High      200- 239                         High                     >239    LDL Chol Calc (NIH)  Date Value Ref Range Status  02/01/2023 114 (H) 0 - 99 mg/dL Final   HDL  Date Value Ref Range Status  02/01/2023 38 (L) >39 mg/dL Final   Triglycerides  Date Value Ref Range Status  02/01/2023 137 0 - 149 mg/dL Final   Triglycerides Piccolo,Waived  Date Value Ref Range Status  11/12/2016 210 (H) <150  mg/dL Final    Comment:                            Normal                   <150                         Borderline High     150 - 199                         High                200 - 499                         Very High                >499          Passed - TSH in normal range and within 360 days    TSH  Date Value Ref Range Status  02/01/2023 0.850 0.450 - 4.500 uIU/mL Final         Passed - Completed PHQ-2 or PHQ-9 in the last 360 days      Passed - Last BP in normal range    BP Readings from Last 1 Encounters:  03/26/23 116/70         Passed - Last Heart Rate in normal range    Pulse Readings from Last 1 Encounters:  03/26/23 72         Passed - Valid encounter within last 6 months    Recent Outpatient Visits           2 months ago Anxiety   [place] [facility] [place], [place] P, DO   3 months ago Chronic right shoulder pain   [place] [facility] [place], Megan P, DO   4 months ago Fatigue due to depression   [place] [facility] [place], Sherran Needs, NP   9 months ago Anxiety   [place] [facility] [place], Megan P, DO   10 months ago Encounter for annual wellness visit (AWV) in Medicare patient   [place] [facility] [place], [place], DO       Future Appointments             In 2 months Laural Benes, Megan P, DO [place] [facility],  [facility]            Passed - CBC within normal limits and completed in the last 12 months    WBC  Date Value Ref Range Status  02/01/2023 5.1 3.4 - 10.8 x10E3/uL Final  07/18/2020 4.6 4.0 - 10.5 K/uL Final   RBC  Date Value Ref Range Status  02/01/2023 3.25 (L) 3.77 - 5.28 x10E6/uL Final  07/18/2020 3.40 (L) 3.87 - 5.11 MIL/uL Final   Hemoglobin  Date Value Ref Range Status  02/01/2023 10.9 (L) 11.1 - 15.9 g/dL Final   Hematocrit  Date Value Ref Range Status  02/01/2023 32.5 (L) 34.0 - 46.6 % Final   MCHC  Date Value Ref Range Status  02/01/2023 33.5 31.5 - 35.7 g/dL Final  16/06/9603 54.0 30.0 - 36.0 g/dL Final   [place]  Date Value Ref Range Status  02/01/2023 33.5 (H) 26.6 - 33.0 pg Final  07/18/2020  32.9 26.0 - 34.0 pg Final   MCV  Date Value Ref Range Status  02/01/2023 100 (H) 79 - 97 fL Final   No results found for: "PLTCOUNTKUC", "LABPLAT", "POCPLA" RDW  Date Value Ref Range Status  02/01/2023 11.5 (L) 11.7 - 15.4 % Final         Passed - CMP within normal limits and completed in the last 12 months    Albumin  Date Value Ref Range Status  02/01/2023 4.0 3.8 - 4.8 g/dL Final   Alkaline Phosphatase  Date Value Ref Range Status  02/01/2023 93 44 - 121 IU/L Final   ALT  Date Value Ref Range Status  02/01/2023 8 0 - 32 IU/L Final   ALT (SGPT) Piccolo, Waived  Date Value Ref Range Status  11/12/2016 15 10 - 47 U/L Final   AST  Date Value Ref Range Status  02/01/2023 13 0 - 40 IU/L Final   AST (SGOT) Piccolo, Waived  Date Value Ref Range Status  11/12/2016 19 11 - 38 U/L Final   BUN  Date Value Ref Range Status  02/01/2023 12 8 - 27 mg/dL Final   Calcium  Date Value Ref Range Status  02/01/2023 8.5 (L) 8.7 - 10.3 mg/dL Final   CO2  Date Value Ref Range Status  02/01/2023 25 20 - 29 mmol/L Final   Bicarbonate  Date Value Ref Range Status  04/07/2018 20.1 20.0 - 28.0 mmol/L Corrected   Creatinine, Ser  Date Value Ref Range Status   02/01/2023 0.75 0.57 - 1.00 mg/dL Final   Glucose  Date Value Ref Range Status  02/01/2023 78 70 - 99 mg/dL Final   Glucose, Bld  Date Value Ref Range Status  07/18/2020 81 70 - 99 mg/dL Final    Comment:    Glucose reference range applies only to samples taken after fasting for at least 8 hours.   Glucose-Capillary  Date Value Ref Range Status  04/14/2018 84 70 - 99 mg/dL Final   Potassium  Date Value Ref Range Status  02/01/2023 3.9 3.5 - 5.2 mmol/L Final   Sodium  Date Value Ref Range Status  02/01/2023 139 134 - 144 mmol/L Final   Bilirubin Total  Date Value Ref Range Status  02/01/2023 <0.2 0.0 - 1.2 mg/dL Final   Bilirubin, Direct  Date Value Ref Range Status  04/05/2018 0.2 0.0 - 0.2 mg/dL Final    Comment:    Please note change in reference range.   Indirect Bilirubin  Date Value Ref Range Status  04/05/2018 0.4 0.3 - 0.9 mg/dL Final    Comment:    Performed at [facility], [address] [place], [place] [address]   Protein, ur  Date Value Ref Range Status  04/05/2018 30 (A) NEGATIVE mg/dL Final   Protein,UA  Date Value Ref Range Status  07/30/2022 1+ (A) Negative/Trace Final   Total Protein  Date Value Ref Range Status  02/01/2023 6.4 6.0 - 8.5 g/dL Final   GFR calc Af Amer  Date Value Ref Range Status  11/24/2019 57 (L) >59 mL/min/1.73 Final   eGFR  Date Value Ref Range Status  02/01/2023 85 >59 mL/min/1.73 Final   GFR, Estimated  Date Value Ref Range Status  07/18/2020 >60 >60 mL/min Final    Comment:    (NOTE) Calculated using the CKD-EPI Creatinine Equation (2021)

## 2023-06-27 ENCOUNTER — Other Ambulatory Visit: Payer: Self-pay | Admitting: Family Medicine

## 2023-06-28 ENCOUNTER — Other Ambulatory Visit: Payer: Self-pay | Admitting: Family Medicine

## 2023-06-28 DIAGNOSIS — M25572 Pain in left ankle and joints of left foot: Secondary | ICD-10-CM

## 2023-06-28 NOTE — Telephone Encounter (Signed)
Rx 03/26/23 #135 [id number] [id number] Requested Prescriptions  Pending Prescriptions Disp Refills   traZODone (DESYREL) 100 MG tablet [Pharmacy Med Name: TRAZODONE 100 MG TABLET] 135 tablet 1    Sig: TAKE 1 TO 1&1/2 TABLETS (100-150 MG TOTAL) BY MOUTH AT BEDTIME AS NEEDED FOR SLEEP     Psychiatry: Antidepressants - Serotonin Modulator Passed - 06/27/2023  3:20 PM      Passed - Completed PHQ-2 or PHQ-9 in the last 360 days      Passed - Valid encounter within last 6 months    Recent Outpatient Visits           3 months ago Anxiety   [place] [facility] [place], Megan P, DO   3 months ago Chronic right shoulder pain   [place] [facility] [place], Megan P, DO   4 months ago Fatigue due to depression   [place] [facility] [place], Sherran Needs, NP   9 months ago Anxiety   [place] [facility] [place], Megan P, DO   11 months ago Encounter for annual wellness visit (AWV) in Medicare patient   [place] [address] [facility] [place], [place], DO       Future Appointments             In 2 months Laural Benes, Oralia Rud, DO [place] [facility]

## 2023-06-29 NOTE — Telephone Encounter (Signed)
Requested medication (s) are due for refill today: Yes  Requested medication (s) are on the active medication list: Yes  Last refill:  Zofran 05/18/23, Seroquel D/Ced 02/01/23, Flexeril 03/26/23  Future visit scheduled: Yes  Notes to clinic:  Unable to refill per protocol, cannot delegate.      Requested Prescriptions  Pending Prescriptions Disp Refills   ondansetron (ZOFRAN) 4 MG tablet [Pharmacy Med Name: ONDANSETRON HCL 4 MG TABLET] 60 tablet 1    Sig: DISSOLVE 1 TABLET IN MOUTH AS NEEDED FOR NAUSEA UP TO 2 TIMES A DAY     Not Delegated - Gastroenterology: Antiemetics - ondansetron Failed - 06/28/2023 11:48 AM      Failed - This refill cannot be delegated      Passed - AST in normal range and within 360 days    AST  Date Value Ref Range Status  02/01/2023 13 0 - 40 IU/L Final   AST (SGOT) Piccolo, Waived  Date Value Ref Range Status  11/12/2016 19 11 - 38 U/L Final         Passed - ALT in normal range and within 360 days    ALT  Date Value Ref Range Status  02/01/2023 8 0 - 32 IU/L Final   ALT (SGPT) Piccolo, Waived  Date Value Ref Range Status  11/12/2016 15 10 - 47 U/L Final         Passed - Valid encounter within last 6 months    Recent Outpatient Visits           3 months ago Anxiety   [place] [facility] [place], Megan P, DO   3 months ago Chronic right shoulder pain   [place] [place], Megan P, DO   4 months ago Fatigue due to depression   [place] [facility] [place], Sherran Needs, NP   9 months ago Anxiety   [place] [facility] [place], Megan P, DO   11 months ago Encounter for annual wellness visit (AWV) in Medicare patient   [place] [facility] [place], [place], DO       Future Appointments             In 2 months Johnson, Megan P, DO [place] [facility]             QUEtiapine (SEROQUEL) 50 MG tablet [Pharmacy Med Name:  QUETIAPINE FUMARATE 50 MG TAB] 30 tablet 0    Sig: TAKE 1 TABLET BY MOUTH EVERYDAY AT BEDTIME     Not Delegated - Psychiatry:  Antipsychotics - Second Generation (Atypical) - quetiapine Failed - 06/28/2023 11:48 AM      Failed - This refill cannot be delegated      Failed - Lipid Panel in normal range within the last 12 months    Cholesterol, Total  Date Value Ref Range Status  02/01/2023 177 100 - 199 mg/dL Final   Cholesterol Piccolo, Waived  Date Value Ref Range Status  11/12/2016 173 <200 mg/dL Final    Comment:                            Desirable                <200                         Borderline High  200- 239                         High                     >239    LDL Chol Calc (NIH)  Date Value Ref Range Status  02/01/2023 114 (H) 0 - 99 mg/dL Final   HDL  Date Value Ref Range Status  02/01/2023 38 (L) >39 mg/dL Final   Triglycerides  Date Value Ref Range Status  02/01/2023 137 0 - 149 mg/dL Final   Triglycerides Piccolo,Waived  Date Value Ref Range Status  11/12/2016 210 (H) <150 mg/dL Final    Comment:                            Normal                   <150                         Borderline High     150 - 199                         High                200 - 499                         Very High                >499          Passed - TSH in normal range and within 360 days    TSH  Date Value Ref Range Status  02/01/2023 0.850 0.450 - 4.500 uIU/mL Final         Passed - Completed PHQ-2 or PHQ-9 in the last 360 days      Passed - Last BP in normal range    BP Readings from Last 1 Encounters:  03/26/23 116/70         Passed - Last Heart Rate in normal range    Pulse Readings from Last 1 Encounters:  03/26/23 72         Passed - Valid encounter within last 6 months    Recent Outpatient Visits           3 months ago Anxiety   [place] [facility] [place], Megan P, DO   3 months ago Chronic right shoulder pain   [facility] [place], Megan P, DO   4 months ago Fatigue due to depression   [place] [facility] [place], Sherran Needs, NP   9 months ago Anxiety   [place] [facility] [place], Megan P, DO   11 months ago Encounter for annual wellness visit (AWV) in Medicare patient   [place] [facility] [place], [place], DO       Future Appointments             In 2 months Johnson, Megan P, DO  [facility]            Passed - CBC within normal limits and completed in the last 12 months    WBC  Date Value Ref Range Status  02/01/2023 5.1 3.4 - 10.8  x10E3/uL Final  07/18/2020 4.6 4.0 - 10.5 K/uL Final   RBC  Date Value Ref Range Status  02/01/2023 3.25 (L) 3.77 - 5.28 x10E6/uL Final  07/18/2020 3.40 (L) 3.87 - 5.11 MIL/uL Final   Hemoglobin  Date Value Ref Range Status  02/01/2023 10.9 (L) 11.1 - 15.9 g/dL Final   Hematocrit  Date Value Ref Range Status  02/01/2023 32.5 (L) 34.0 - 46.6 % Final   MCHC  Date Value Ref Range Status  02/01/2023 33.5 31.5 - 35.7 g/dL Final  62/13/0865 78.4 30.0 - 36.0 g/dL Final   [facility]  Date Value Ref Range Status  02/01/2023 33.5 (H) 26.6 - 33.0 pg Final  07/18/2020 32.9 26.0 - 34.0 pg Final   MCV  Date Value Ref Range Status  02/01/2023 100 (H) 79 - 97 fL Final   No results found for: "PLTCOUNTKUC", "LABPLAT", "POCPLA" RDW  Date Value Ref Range Status  02/01/2023 11.5 (L) 11.7 - 15.4 % Final         Passed - CMP within normal limits and completed in the last 12 months    Albumin  Date Value Ref Range Status  02/01/2023 4.0 3.8 - 4.8 g/dL Final   Alkaline Phosphatase  Date Value Ref Range Status  02/01/2023 93 44 - 121 IU/L Final   ALT  Date Value Ref Range Status  02/01/2023 8 0 - 32 IU/L Final   ALT (SGPT) Piccolo, Waived  Date Value Ref Range Status  11/12/2016 15 10 - 47 U/L Final   AST  Date Value Ref Range Status   02/01/2023 13 0 - 40 IU/L Final   AST (SGOT) Piccolo, Waived  Date Value Ref Range Status  11/12/2016 19 11 - 38 U/L Final   BUN  Date Value Ref Range Status  02/01/2023 12 8 - 27 mg/dL Final   Calcium  Date Value Ref Range Status  02/01/2023 8.5 (L) 8.7 - 10.3 mg/dL Final   CO2  Date Value Ref Range Status  02/01/2023 25 20 - 29 mmol/L Final   Bicarbonate  Date Value Ref Range Status  04/07/2018 20.1 20.0 - 28.0 mmol/L Corrected   Creatinine, Ser  Date Value Ref Range Status  02/01/2023 0.75 0.57 - 1.00 mg/dL Final   Glucose  Date Value Ref Range Status  02/01/2023 78 70 - 99 mg/dL Final   Glucose, Bld  Date Value Ref Range Status  07/18/2020 81 70 - 99 mg/dL Final    Comment:    Glucose reference range applies only to samples taken after fasting for at least 8 hours.   Glucose-Capillary  Date Value Ref Range Status  04/14/2018 84 70 - 99 mg/dL Final   Potassium  Date Value Ref Range Status  02/01/2023 3.9 3.5 - 5.2 mmol/L Final   Sodium  Date Value Ref Range Status  02/01/2023 139 134 - 144 mmol/L Final   Bilirubin Total  Date Value Ref Range Status  02/01/2023 <0.2 0.0 - 1.2 mg/dL Final   Bilirubin, Direct  Date Value Ref Range Status  04/05/2018 0.2 0.0 - 0.2 mg/dL Final    Comment:    Please note change in reference range.   Indirect Bilirubin  Date Value Ref Range Status  04/05/2018 0.4 0.3 - 0.9 mg/dL Final    Comment:    Performed at [facility], [address] [place], [place] [address]   Protein, ur  Date Value Ref Range Status  04/05/2018 30 (A) NEGATIVE mg/dL Final   Protein,UA  Date Value Ref Range Status  07/30/2022 1+ (A) Negative/Trace Final   Total Protein  Date Value Ref Range Status  02/01/2023 6.4 6.0 - 8.5 g/dL Final   GFR calc Af Amer  Date Value Ref Range Status  11/24/2019 57 (L) >59 mL/min/1.73 Final   eGFR  Date Value Ref Range Status  02/01/2023 85 >59 mL/min/1.73 Final   GFR, Estimated  Date  Value Ref Range Status  07/18/2020 >60 >60 mL/min Final    Comment:    (NOTE) Calculated using the CKD-EPI Creatinine Equation (2021)           cyclobenzaprine (FLEXERIL) 5 MG tablet [Pharmacy Med Name: CYCLOBENZAPRINE 5 MG TABLET] 30 tablet 1    Sig: TAKE 1 TABLET BY MOUTH EVERYDAY AT BEDTIME     Not Delegated - Analgesics:  Muscle Relaxants Failed - 06/28/2023 11:48 AM      Failed - This refill cannot be delegated      Passed - Valid encounter within last 6 months    Recent Outpatient Visits           3 months ago Anxiety   [place] [facility], Megan P, DO   3 months ago Chronic right shoulder pain   [place] [facility] [place], Megan P, DO   4 months ago Fatigue due to depression   [place] [facility] [place], Sherran Needs, NP   9 months ago Anxiety   [place] [facility] [place], Megan P, DO   11 months ago Encounter for annual wellness visit (AWV) in Medicare patient    [facility] [place], [place], DO       Future Appointments             In 2 months Laural Benes, Oralia Rud, DO  [facility]

## 2023-08-12 ENCOUNTER — Ambulatory Visit: Payer: Medicare HMO | Admitting: Emergency Medicine

## 2023-08-12 VITALS — Ht 62.25 in | Wt 106.0 lb

## 2023-08-12 DIAGNOSIS — Z0000 Encounter for general adult medical examination without abnormal findings: Secondary | ICD-10-CM

## 2023-08-12 NOTE — Progress Notes (Signed)
Subjective:   Annette Miller is a 72 y.o. female who presents for Medicare Annual (Subsequent) preventive examination.  Visit Complete: Virtual I connected with  Annette Miller on 08/12/23 by a audio enabled telemedicine application and verified that I am speaking with the correct person using two identifiers.  Patient Location: Home  Provider Location: Home Office  I discussed the limitations of evaluation and management by telemedicine. The patient expressed understanding and agreed to proceed.  Vital Signs: Because this visit was a virtual/telehealth visit, some criteria may be missing or patient reported. Any vitals not documented were not able to be obtained and vitals that have been documented are patient reported.   Cardiac Risk Factors include: advanced age (>15men, >53 women);dyslipidemia;smoking/ tobacco exposure     Objective:    Today's Vitals   08/12/23 0859  Weight: 106 lb (48.1 kg)  Height: 5' 2.25" (1.581 m)   Body mass index is 19.23 kg/m.     08/12/2023    9:17 AM 07/20/2019    9:28 AM 06/28/2019    1:05 PM 05/24/2019    1:30 PM 04/03/2019    2:52 PM 09/08/2018    3:54 PM 08/30/2018    3:48 PM  Advanced Directives  Does Patient Have a Medical Advance Directive? Yes No Yes Yes Yes Yes Yes  Type of Estate agent of [place];Living will  Healthcare Power of [place];Living will  Living will;Healthcare Power of [employer] Power of [place];Living will Living will;Healthcare Power of Attorney  Does patient want to make changes to medical advance directive? No - Patient declined      Yes (MAU/Ambulatory/Procedural Areas - Information given)  Copy of Healthcare Power of Attorney in Chart? Yes - validated most recent copy scanned in chart (See row information)  No - copy requested  Yes - validated most recent copy scanned in chart (See row information) Yes - validated most recent copy scanned in chart (See row information)    Pre-existing out of facility DNR order (yellow form or pink MOST form)    Pink MOST form placed in chart (order not valid for inpatient use)       Current Medications (verified) Outpatient Encounter Medications as of 08/12/2023  Medication Sig   acetaminophen (TYLENOL) 325 MG tablet Take 650 mg by mouth every 6 (six) hours as needed for mild pain or fever.   albuterol (VENTOLIN HFA) 108 (90 Base) MCG/ACT inhaler TAKE 2 PUFFS BY MOUTH EVERY 6 HOURS AS NEEDED FOR WHEEZE OR SHORTNESS OF BREATH   Ascorbic Acid (VITAMIN C) 1000 MG tablet Take 3,000 mg by mouth daily.   Calcium Carb-Cholecalciferol (OYSTER SHELL CALCIUM PLUS D PO) Take 1 tablet by mouth daily.    cyclobenzaprine (FLEXERIL) 5 MG tablet TAKE 1 TABLET BY MOUTH EVERYDAY AT BEDTIME   Docusate Sodium (COLACE PO) Take 1 Dose by mouth daily.   fexofenadine (ALLEGRA) 180 MG tablet TAKE 1 TABLET BY MOUTH EVERY DAY   fluticasone (FLONASE) 50 MCG/ACT nasal spray Place 2 sprays into both nostrils daily.   gabapentin (NEURONTIN) 300 MG capsule Take 1 tab in the AM and PM and 2 pills QHS   levothyroxine (SYNTHROID) 25 MCG tablet Take 1 tablet (25 mcg total) by mouth daily before breakfast.   magnesium 30 MG tablet Take 30 mg by mouth daily.    Multiple Vitamin (MULTIVITAMIN WITH MINERALS) TABS tablet Take 1 tablet by mouth daily.   niacin 250 MG CR capsule Take 250 mg by mouth at  bedtime.   ondansetron (ZOFRAN) 4 MG tablet DISSOLVE 1 TABLET IN MOUTH AS NEEDED FOR NAUSEA UP TO 2 TIMES A DAY   pantoprazole (PROTONIX) 40 MG tablet Take 1 tablet (40 mg total) by mouth 2 (two) times daily.   promethazine (PHENERGAN) 25 MG suppository Place 1 suppository (25 mg total) rectally every 6 (six) hours as needed for nausea or vomiting.   QUEtiapine Fumarate (SEROQUEL XR) 150 MG 24 hr tablet Take 1 tablet (150 mg total) by mouth at bedtime.   rosuvastatin (CRESTOR) 10 MG tablet Take 1 tablet (10 mg total) by mouth daily.   Selenium 200 MCG CAPS Take 200 mcg  by mouth daily.    traZODone (DESYREL) 100 MG tablet Take 1-1.5 tablets (100-150 mg total) by mouth at bedtime as needed. for sleep   triamcinolone cream (KENALOG) 0.1 % Apply 1 application topically 2 (two) times daily.   vitamin B-12 (CYANOCOBALAMIN) 250 MCG tablet Take 250 mcg by mouth daily.   VITAMIN D PO Take by mouth. 1250 mcg   vitamin E 1000 UNIT capsule Take 1,000 Units by mouth 2 (two) times daily.   Zinc Sulfate (ZINC 15 PO) Take 15 mg by mouth daily.    No facility-administered encounter medications on file as of 08/12/2023.    Allergies (verified) Aspirin, Prednisone, Codeine, and Tape   History: Past Medical History:  Diagnosis Date   Allergy    Anemia    improved since surgery in May 2019   Anxiety    Arthritis    Depression    GERD (gastroesophageal reflux disease) 2019   PUD, history of diverticulitis   Goiter    H/O cardiac arrest 03/2018   s/p surgery and while in hospital. no cardiac history   Headache    History of blood transfusion 01/2018   Hyperlipidemia    Hypothyroidism    Osteoporosis    Past Surgical History:  Procedure Laterality Date   ABDOMINAL HYSTERECTOMY  1979   APPENDECTOMY  01/2018   COLONOSCOPY WITH PROPOFOL N/A 01/14/2018   Procedure: COLONOSCOPY WITH PROPOFOL;  Surgeon: Toney Reil, MD;  Location: [facility] ENDOSCOPY;  Service: Gastroenterology;  Laterality: N/A;   COLONOSCOPY WITH PROPOFOL N/A 06/24/2018   Procedure: COLONOSCOPY WITH PROPOFOL;  Surgeon: [name], MD;  Location: [facility] ENDOSCOPY;  Service: Gastroenterology;  Laterality: N/A;   COLONOSCOPY WITH PROPOFOL N/A 05/24/2019   Procedure: COLONOSCOPY WITH PROPOFOL;  Surgeon: [name], MD;  Location: [facility] ENDOSCOPY;  Service: Gastroenterology;  Laterality: N/A;   COLOSTOMY TAKEDOWN N/A 08/30/2018   Procedure: COLOSTOMY TAKEDOWN;  Surgeon: Leafy Ro, MD;  Location: [facility] ORS;  Service: General;  Laterality: N/A;   DIAGNOSTIC LAPAROSCOPY     ESOPHAGOGASTRODUODENOSCOPY  N/A 04/06/2018   Procedure: ESOPHAGOGASTRODUODENOSCOPY (EGD);  Surgeon: Toney Reil, MD;  Location: [facility] ENDOSCOPY;  Service: Gastroenterology;  Laterality: N/A;   ESOPHAGOGASTRODUODENOSCOPY (EGD) WITH PROPOFOL N/A 01/14/2018   Procedure: ESOPHAGOGASTRODUODENOSCOPY (EGD) WITH PROPOFOL;  Surgeon: Toney Reil, MD;  Location: [facility] ENDOSCOPY;  Service: Gastroenterology;  Laterality: N/A;   ESOPHAGOGASTRODUODENOSCOPY (EGD) WITH PROPOFOL N/A 06/24/2018   Procedure: ESOPHAGOGASTRODUODENOSCOPY (EGD) WITH PROPOFOL;  Surgeon: [name], MD;  Location: [facility] ENDOSCOPY;  Service: Gastroenterology;  Laterality: N/A;   FLEXIBLE SIGMOIDOSCOPY N/A 06/28/2019   Procedure: FLEXIBLE SIGMOIDOSCOPY with Dilation;  Surgeon: [name], MD;  Location: [facility] ENDOSCOPY;  Service: Gastroenterology;  Laterality: N/A;   FLEXIBLE SIGMOIDOSCOPY N/A 07/20/2019   Procedure: FLEXIBLE SIGMOIDOSCOPY;  Surgeon: [name], MD;  Location: [facility] ENDOSCOPY;  Service: Gastroenterology;  Laterality: N/A;   INCISION AND DRAINAGE ABSCESS Right 07/18/2020   Procedure: INCISION AND DRAINAGE ABSCESS, axillary abscess;  Surgeon: Leafy Ro, MD;  Location: [facility] ORS;  Service: General;  Laterality: Right;   LAPAROTOMY N/A 02/25/2018   Procedure: EXPLORATORY LAPAROTOMY, PARTMAN'S PROCEDURE, APPENDECTOMY, COLOSTOMY;  Surgeon: Star Age, DO;  Location: [facility] ORS;  Service: General;  Laterality: N/A;   TONSILLECTOMY  1976   VENTRAL HERNIA REPAIR N/A 08/30/2018   Procedure: HERNIA REPAIR VENTRAL ADULT;  Surgeon: Leafy Ro, MD;  Location: [facility] ORS;  Service: General;  Laterality: N/A;   VISCERAL ANGIOGRAPHY N/A 04/07/2018   Procedure: VISCERAL ANGIOGRAPHY;  Surgeon: Annice Needy, MD;  Location: [facility] INVASIVE CV LAB;  Service: Cardiovascular;  Laterality: N/A;   Family History  Problem Relation Age of Onset   Cancer Mother    AAA (abdominal aortic aneurysm) Father    Cancer Maternal Grandmother    Stroke Maternal  Grandmother    Cancer Paternal Grandfather    Social History   Socioeconomic History   Marital status: Widowed    Spouse name: Not on file   Number of children: 1   Years of education: Not on file   Highest education level: Associate degree: academic program  Occupational History    Comment: retired   Occupation: travel Engineer, civil (consulting)  Tobacco Use   Smoking status: Some Days    Current packs/day: 0.10    Average packs/day: 0.1 packs/day for 59.9 years (6.0 ttl pk-yrs)    Types: Cigarettes    Start date: 1965   Smokeless tobacco: Never   Tobacco comments:    smokes occasionally   Vaping Use   Vaping status: Never Used  Substance and Sexual Activity   Alcohol use: No   Drug use: No   Sexual activity: Not Currently  Other Topics Concern   Not on file  Social History Narrative   Travel nurse   Social Determinants of Health   Financial Resource Strain: Low Risk  (08/12/2023)   Overall Financial Resource Strain (CARDIA)    Difficulty of Paying Living Expenses: Not hard at all  Food Insecurity: No Food Insecurity (08/12/2023)   Hunger Vital Sign    Worried About Running Out of Food in the Last Year: Never true    Ran Out of Food in the Last Year: Never true  Transportation Needs: No Transportation Needs (08/12/2023)   PRAPARE - Administrator, Civil Service (Medical): No    Lack of Transportation (Non-Medical): No  Physical Activity: Sufficiently Active (08/12/2023)   Exercise Vital Sign    Days of Exercise per Week: 3 days    Minutes of Exercise per Session: 60 min  Stress: Stress Concern Present (08/12/2023)   [employer] of Occupational Health - Occupational Stress Questionnaire    Feeling of Stress : Rather much  Social Connections: Socially Isolated (08/12/2023)   Social Connection and Isolation Panel [NHANES]    Frequency of Communication with Friends and Family: Never    Frequency of Social Gatherings with Friends and Family: Once a week    Attends  Religious Services: Never    Database administrator or Organizations: No    Attends Banker Meetings: Never    Marital Status: Widowed    Tobacco Counseling Ready to quit: Not Answered Counseling given: Not Answered Tobacco comments: smokes occasionally    Clinical Intake:  Pre-visit preparation completed: Yes  Pain : No/denies pain     BMI - recorded:  19.23 Nutritional Status: BMI of 19-24  Normal Nutritional Risks: None Diabetes: No  How often do you need to have someone help you when you read instructions, pamphlets, or other written materials from your doctor or pharmacy?: 1 - Never  Interpreter Needed?: No  Information entered by :: Tora Kindred, CMA   Activities of Daily Living    08/12/2023    9:01 AM  In your present state of health, do you have any difficulty performing the following activities:  Hearing? 0  Vision? 0  Difficulty concentrating or making decisions? 0  Walking or climbing stairs? 1  Comment occassionally get dizzy  Dressing or bathing? 0  Doing errands, shopping? 0  Preparing Food and eating ? N  Using the Toilet? N  In the past six months, have you accidently leaked urine? N  Do you have problems with loss of bowel control? N  Managing your Medications? N  Managing your Finances? N  Housekeeping or managing your Housekeeping? N    Patient Care Team: Dorcas Carrow, DO as PCP - General (Family Medicine)  Indicate any recent Medical Services you may have received from other than [facility] providers in the past year (date may be approximate).     Assessment:   This is a routine wellness examination for Annette Miller.  Hearing/Vision screen Hearing Screening - Comments:: Denies hearing loss Vision Screening - Comments:: Needs eye exam   Goals Addressed               This Visit's Progress     Patient Stated (pt-stated)        Exercise more, get better eating habits      Depression Screen    08/12/2023    9:12 AM  03/26/2023   10:45 AM 02/01/2023    3:44 PM 09/04/2022    3:37 PM 07/30/2022    2:02 PM 02/09/2022    2:00 PM 12/15/2021    8:25 AM  PHQ 2/9 Scores  PHQ - 2 Score 0 1 3 2 4  0 1  PHQ- 9 Score 0 5 15 9 15 7 10     Fall Risk    08/12/2023    9:18 AM 09/04/2022    3:37 PM 07/30/2022    2:02 PM 02/09/2022    2:00 PM 12/15/2021    8:24 AM  Fall Risk   Falls in the past year? 1 1 1 1  0  Number falls in past yr: 1 1 1 1  0  Injury with Fall? 1 1 1 1  0  Risk for fall due to : History of fall(s);Impaired balance/gait;Orthopedic patient;Other (Comment) History of fall(s);Impaired balance/gait History of fall(s) History of fall(s) No Fall Risks  Risk for fall due to: Comment dizziness      Follow up Education provided;Falls prevention discussed;Falls evaluation completed Falls evaluation completed Falls evaluation completed Falls evaluation completed Falls evaluation completed    MEDICARE RISK AT HOME: Medicare Risk at Home Any stairs in or around the home?: Yes If so, are there any without handrails?: Yes Home free of loose throw rugs in walkways, pet beds, electrical cords, etc?: Yes Adequate lighting in your home to reduce risk of falls?: Yes Life alert?: No Use of a cane, walker or w/c?: No Grab bars in the bathroom?: No Shower chair or bench in shower?: No Elevated toilet seat or a handicapped toilet?: No  TIMED UP AND GO:  Was the test performed?  No    Cognitive Function:  08/12/2023    9:20 AM 07/30/2022    2:32 PM 04/03/2019    2:59 PM  6CIT Screen  What Year? 0 points 0 points 0 points  What month? 0 points 0 points 0 points  What time? 0 points 0 points 0 points  Count back from 20 0 points 0 points 0 points  Months in reverse 0 points 0 points 0 points  Repeat phrase 0 points 2 points 0 points  Total Score 0 points 2 points 0 points    Immunizations Immunization History  Administered Date(s) Administered   Fluad Quad(high Dose 65+) 07/16/2020, 06/03/2022    Influenza, High Dose Seasonal PF 06/10/2018, 05/19/2019   Influenza-Unspecified 06/12/2015   Moderna Sars-Covid-2 Vaccination 09/25/2019, 10/23/2019, 09/11/2020   PPD Test 02/14/2020, 02/20/2020   Pfizer(Comirnaty)Fall Seasonal Vaccine 12 years and older 08/13/2022, 06/27/2023   Pneumococcal Conjugate-13 05/12/2016, 05/19/2019   Pneumococcal Polysaccharide-23 07/16/2020   Respiratory Syncytial Virus Vaccine,Recomb Aduvanted(Arexvy) 08/13/2022   Td 02/10/2008, 02/21/2020   Zoster Recombinant(Shingrix) 06/03/2022   Zoster, Live 04/21/2012    TDAP status: Up to date  Flu Vaccine status: Up to date  Pneumococcal vaccine status: Up to date  Covid-19 vaccine status: Completed vaccines  Qualifies for Shingles Vaccine? Yes   Zostavax completed No   Shingrix Completed?: Yes  Screening Tests Health Maintenance  Topic Date Due   MAMMOGRAM  Never done   DEXA SCAN  Never done   Medicare Annual Wellness (AWV)  08/11/2024   Colonoscopy  05/23/2029   DTaP/Tdap/Td (3 - Tdap) 02/20/2030   Pneumonia Vaccine 78+ Years old  Completed   INFLUENZA VACCINE  Completed   COVID-19 Vaccine  Completed   Hepatitis C Screening  Completed   Zoster Vaccines- Shingrix  Completed   HPV VACCINES  Aged Out    Health Maintenance  Health Maintenance Due  Topic Date Due   MAMMOGRAM  Never done   DEXA SCAN  Never done    Colorectal cancer screening: Type of screening: Colonoscopy. Completed 05/23/09. Repeat every 10 years  Mammogram Status: patient declined  Bone Density Status: patient declined  Lung Cancer Screening: (Low Dose CT Chest recommended if Age 53-80 years, 20 pack-year currently smoking OR have quit w/in 15years.) does not qualify.   Lung Cancer Screening Referral: n/a  Additional Screening:  Hepatitis C Screening: does not qualify; Completed 05/20/17  Vision Screening: Recommended annual ophthalmology exams for early detection of glaucoma and other disorders of the eye.  Dental  Screening: Recommended annual dental exams for proper oral hygiene   Community Resource Referral / Chronic Care Management: CRR required this visit?  No   CCM required this visit?  No     Plan:     I have personally reviewed and noted the following in the patient's chart:   Medical and social history Use of alcohol, tobacco or illicit drugs  Current medications and supplements including opioid prescriptions. Patient is not currently taking opioid prescriptions. Functional ability and status Nutritional status Physical activity Advanced directives List of other physicians Hospitalizations, surgeries, and ER visits in previous 12 months Vitals Screenings to include cognitive, depression, and falls Referrals and appointments  In addition, I have reviewed and discussed with patient certain preventive protocols, quality metrics, and best practice recommendations. A written personalized care plan for preventive services as well as general preventive health recommendations were provided to patient.     Tora Kindred, CMA   08/12/2023   After Visit Summary: (MyChart) Due to this being a telephonic  visit, the after visit summary with patients personalized plan was offered to patient via MyChart   Nurse Notes:  Needs eye exam Declined orders for MMG and DEXA scan

## 2023-08-12 NOTE — Patient Instructions (Addendum)
Annette Miller , Thank you for taking time to come for your Medicare Wellness Visit. I appreciate your ongoing commitment to your health goals. Please review the following plan we discussed and let me know if I can assist you in the future.   Referrals/Orders/Follow-Ups/Clinician Recommendations: Get an eye exam at your earliest convenience.   This is a list of the screening recommended for you and due dates:  Health Maintenance  Topic Date Due   Mammogram  Never done   DEXA scan (bone density measurement)  Never done   Medicare Annual Wellness Visit  08/11/2024   Colon Cancer Screening  05/23/2029   DTaP/Tdap/Td vaccine (4 - Td or Tdap) 06/26/2033   Pneumonia Vaccine  Completed   Flu Shot  Completed   COVID-19 Vaccine  Completed   Hepatitis C Screening  Completed   Zoster (Shingles) Vaccine  Completed   HPV Vaccine  Aged Out    Advanced directives: (In Chart) A copy of your advanced directives are scanned into your chart should your provider ever need it.  Next Medicare Annual Wellness Visit scheduled for next year: Yes, 08/22/24 @ 8:40am  Fall Prevention in the Home, Adult Falls can cause injuries and affect people of all ages. There are many simple things that you can do to make your home safe and to help prevent falls. If you need it, ask for help making these changes. What actions can I take to prevent falls? General information Use good lighting in all rooms. Make sure to: Replace any light bulbs that burn out. Turn on lights if it is dark and use night-lights. Keep items that you use often in easy-to-reach places. Lower the shelves around your home if needed. Move furniture so that there are clear paths around it. Do not keep throw rugs or other things on the floor that can make you trip. If any of your floors are uneven, fix them. Add color or contrast paint or tape to clearly mark and help you see: Grab bars or handrails. First and last steps of staircases. Where the edge  of each step is. If you use a ladder or stepladder: Make sure that it is fully opened. Do not climb a closed ladder. Make sure the sides of the ladder are locked in place. Have someone hold the ladder while you use it. Know where your pets are as you move through your home. What can I do in the bathroom?     Keep the floor dry. Clean up any water that is on the floor right away. Remove soap buildup in the bathtub or shower. Buildup makes bathtubs and showers slippery. Use non-skid mats or decals on the floor of the bathtub or shower. Attach bath mats securely with double-sided, non-slip rug tape. If you need to sit down while you are in the shower, use a non-slip stool. Install grab bars by the toilet and in the bathtub and shower. Do not use towel bars as grab bars. What can I do in the bedroom? Make sure that you have a light by your bed that is easy to reach. Do not use any sheets or blankets on your bed that hang to the floor. Have a firm bench or chair with side arms that you can use for support when you get dressed. What can I do in the kitchen? Clean up any spills right away. If you need to reach something above you, use a sturdy step stool that has a grab bar. Keep electrical cables  out of the way. Do not use floor polish or wax that makes floors slippery. What can I do with my stairs? Do not leave anything on the stairs. Make sure that you have a light switch at the top and the bottom of the stairs. Have them installed if you do not have them. Make sure that there are handrails on both sides of the stairs. Fix handrails that are broken or loose. Make sure that handrails are as long as the staircases. Install non-slip stair treads on all stairs in your home if they do not have carpet. Avoid having throw rugs at the top or bottom of stairs, or secure the rugs with carpet tape to prevent them from moving. Choose a carpet design that does not hide the edge of steps on the stairs.  Make sure that carpet is firmly attached to the stairs. Fix any carpet that is loose or worn. What can I do on the outside of my home? Use bright outdoor lighting. Repair the edges of walkways and driveways and fix any cracks. Clear paths of anything that can make you trip, such as tools or rocks. Add color or contrast paint or tape to clearly mark and help you see high doorway thresholds. Trim any bushes or trees on the main path into your home. Check that handrails are securely fastened and in good repair. Both sides of all steps should have handrails. Install guardrails along the edges of any raised decks or porches. Have leaves, snow, and ice cleared regularly. Use sand, salt, or ice melt on walkways during winter months if you live where there is ice and snow. In the garage, clean up any spills right away, including grease or oil spills. What other actions can I take? Review your medicines with your health care provider. Some medicines can make you confused or feel dizzy. This can increase your chance of falling. Wear closed-toe shoes that fit well and support your feet. Wear shoes that have rubber soles and low heels. Use a cane, walker, scooter, or crutches that help you move around if needed. Talk with your provider about other ways that you can decrease your risk of falls. This may include seeing a physical therapist to learn to do exercises to improve movement and strength. Where to find more information Centers for Disease Control and Prevention, STEADI: TonerPromos.no [employer] on Aging: BaseRingTones.pl National Institute on Aging: BaseRingTones.pl Contact a health care provider if: You are afraid of falling at home. You feel weak, drowsy, or dizzy at home. You fall at home. Get help right away if you: Lose consciousness or have trouble moving after a fall. Have a fall that causes a head injury. These symptoms may be an emergency. Get help right away. Call 911. Do not wait to see if  the symptoms will go away. Do not drive yourself to the hospital. This information is not intended to replace advice given to you by your health care provider. Make sure you discuss any questions you have with your health care provider. Document Revised: 05/18/2022 Document Reviewed: 05/18/2022 Elsevier Patient Education  2024 [employer].

## 2023-08-13 ENCOUNTER — Other Ambulatory Visit: Payer: Self-pay | Admitting: Family Medicine

## 2023-08-13 DIAGNOSIS — M25572 Pain in left ankle and joints of left foot: Secondary | ICD-10-CM

## 2023-08-16 NOTE — Telephone Encounter (Signed)
Does she have enough to get to her appointment in 2 weeks?

## 2023-08-16 NOTE — Telephone Encounter (Signed)
Requested medication (s) are due for refill today: routing for review  Requested medication (s) are on the active medication list: yes  Last refill:  multiple dates  Future visit scheduled: yes  Notes to clinic:  Unable to refill per protocol, cannot delegate.      Requested Prescriptions  Pending Prescriptions Disp Refills   cyclobenzaprine (FLEXERIL) 5 MG tablet [Pharmacy Med Name: CYCLOBENZAPRINE 5 MG TABLET] 30 tablet 1    Sig: TAKE 1 TABLET BY MOUTH EVERYDAY AT BEDTIME     Not Delegated - Analgesics:  Muscle Relaxants Failed - 08/13/2023  2:06 PM      Failed - This refill cannot be delegated      Passed - Valid encounter within last 6 months    Recent Outpatient Visits           4 months ago Anxiety   [place] [facility] [place], Megan P, DO   5 months ago Chronic right shoulder pain   [place] [facility] [place], Megan P, DO   6 months ago Fatigue due to depression   [place] [place], Sherran Needs, NP   11 months ago Anxiety   [place] [facility] [place], Megan [place], DO   1 year ago Encounter for annual wellness visit (AWV) in Medicare patient   [facility] [place], [place], DO       Future Appointments             In 2 weeks Laural Benes, Megan P, DO [place] [facility]             ondansetron (ZOFRAN) 4 MG tablet [Pharmacy Med Name: ONDANSETRON HCL 4 MG TABLET] 60 tablet 1    Sig: DISSOLVE 1 TABLET IN MOUTH AS NEEDED FOR NAUSEA UP TO 2 TIMES A DAY     Not Delegated - Gastroenterology: Antiemetics - ondansetron Failed - 08/13/2023  2:06 PM      Failed - This refill cannot be delegated      Passed - AST in normal range and within 360 days    AST  Date Value Ref Range Status  02/01/2023 13 0 - 40 IU/L Final   AST (SGOT) Piccolo, Waived  Date Value Ref Range Status  11/12/2016 19 11 - 38 U/L Final         Passed - ALT in normal  range and within 360 days    ALT  Date Value Ref Range Status  02/01/2023 8 0 - 32 IU/L Final   ALT (SGPT) Piccolo, Waived  Date Value Ref Range Status  11/12/2016 15 10 - 47 U/L Final         Passed - Valid encounter within last 6 months    Recent Outpatient Visits           4 months ago Anxiety   [place] [facility] [place], Megan P, DO   5 months ago Chronic right shoulder pain   [place] [facility] [place], Megan P, DO   6 months ago Fatigue due to depression   [place] [facility] [place], Sherran Needs, NP   11 months ago Anxiety   [place] [place], [place], DO   1 year ago Encounter for annual wellness visit (AWV) in Medicare patient   [place] [facility] [place], Oralia Rud, DO       Future Appointments  In 2 weeks [place], Megan P, DO [place] [facility]             traZODone (DESYREL) 100 MG tablet [Pharmacy Med Name: TRAZODONE 100 MG TABLET] 135 tablet 1    Sig: TAKE 1 TO 1&1/2 TABLETS (100-150 MG TOTAL) BY MOUTH AT BEDTIME AS NEEDED FOR SLEEP     Psychiatry: Antidepressants - Serotonin Modulator Passed - 08/13/2023  2:06 PM      Passed - Completed PHQ-2 or PHQ-9 in the last 360 days      Passed - Valid encounter within last 6 months    Recent Outpatient Visits           4 months ago Anxiety   [place] [facility] [place], Megan P, DO   5 months ago Chronic right shoulder pain   [place] [facility] [place], Megan P, DO   6 months ago Fatigue due to depression   [facility] [place], Sherran Needs, NP   11 months ago Anxiety   [place] [facility] [place], Megan [place], DO   1 year ago Encounter for annual wellness visit (AWV) in Medicare patient   [place] [facility] [place], [place], DO       Future Appointments             In 2  weeks Laural Benes, Megan P, DO [place] [facility]             QUEtiapine (SEROQUEL) 50 MG tablet [Pharmacy Med Name: QUETIAPINE FUMARATE 50 MG TAB] 30 tablet 0    Sig: TAKE 1 TABLET BY MOUTH EVERYDAY AT BEDTIME     Not Delegated - Psychiatry:  Antipsychotics - Second Generation (Atypical) - quetiapine Failed - 08/13/2023  2:06 PM      Failed - This refill cannot be delegated      Failed - Lipid Panel in normal range within the last 12 months    Cholesterol, Total  Date Value Ref Range Status  02/01/2023 177 100 - 199 mg/dL Final   Cholesterol Piccolo, Waived  Date Value Ref Range Status  11/12/2016 173 <200 mg/dL Final    Comment:                            Desirable                <200                         Borderline High      200- 239                         High                     >239    LDL Chol Calc (NIH)  Date Value Ref Range Status  02/01/2023 114 (H) 0 - 99 mg/dL Final   HDL  Date Value Ref Range Status  02/01/2023 38 (L) >39 mg/dL Final   Triglycerides  Date Value Ref Range Status  02/01/2023 137 0 - 149 mg/dL Final   Triglycerides Piccolo,Waived  Date Value Ref Range Status  11/12/2016 210 (H) <150 mg/dL Final    Comment:  Normal                   <150                         Borderline High     150 - 199                         High                200 - 499                         Very High                >499          Failed - CBC within normal limits and completed in the last 12 months    WBC  Date Value Ref Range Status  02/01/2023 5.1 3.4 - 10.8 x10E3/uL Final  07/18/2020 4.6 4.0 - 10.5 K/uL Final   RBC  Date Value Ref Range Status  02/01/2023 3.25 (L) 3.77 - 5.28 x10E6/uL Final  07/18/2020 3.40 (L) 3.87 - 5.11 MIL/uL Final   Hemoglobin  Date Value Ref Range Status  02/01/2023 10.9 (L) 11.1 - 15.9 g/dL Final   Hematocrit  Date Value Ref Range Status  02/01/2023 32.5 (L) 34.0 - 46.6 %  Final   MCHC  Date Value Ref Range Status  02/01/2023 33.5 31.5 - 35.7 g/dL Final  16/06/9603 54.0 30.0 - 36.0 g/dL Final   [facility]  Date Value Ref Range Status  02/01/2023 33.5 (H) 26.6 - 33.0 pg Final  07/18/2020 32.9 26.0 - 34.0 pg Final   MCV  Date Value Ref Range Status  02/01/2023 100 (H) 79 - 97 fL Final   No results found for: "PLTCOUNTKUC", "LABPLAT", "POCPLA" RDW  Date Value Ref Range Status  02/01/2023 11.5 (L) 11.7 - 15.4 % Final         Passed - TSH in normal range and within 360 days    TSH  Date Value Ref Range Status  02/01/2023 0.850 0.450 - 4.500 uIU/mL Final         Passed - Completed PHQ-2 or PHQ-9 in the last 360 days      Passed - Last BP in normal range    BP Readings from Last 1 Encounters:  03/26/23 116/70         Passed - Last Heart Rate in normal range    Pulse Readings from Last 1 Encounters:  03/26/23 72         Passed - Valid encounter within last 6 months    Recent Outpatient Visits           4 months ago Anxiety   [place] [facility] [place], Megan P, DO   5 months ago Chronic right shoulder pain   [place] [facility] [place], Megan P, DO   6 months ago Fatigue due to depression   [place] [facility] [place], Sherran Needs, NP   11 months ago Anxiety   [place] [facility] [place], [place], DO   1 year ago Encounter for annual wellness visit (AWV) in Medicare patient   [place] [facility] [place], Oralia Rud, DO       Future Appointments  In 2 weeks Laural Benes, Oralia Rud, DO [place] [facility]            Passed - CMP within normal limits and completed in the last 12 months    Albumin  Date Value Ref Range Status  02/01/2023 4.0 3.8 - 4.8 g/dL Final   Alkaline Phosphatase  Date Value Ref Range Status  02/01/2023 93 44 - 121 IU/L Final   ALT  Date Value Ref Range Status  02/01/2023 8 0 - 32 IU/L Final    ALT (SGPT) Piccolo, Waived  Date Value Ref Range Status  11/12/2016 15 10 - 47 U/L Final   AST  Date Value Ref Range Status  02/01/2023 13 0 - 40 IU/L Final   AST (SGOT) Piccolo, Waived  Date Value Ref Range Status  11/12/2016 19 11 - 38 U/L Final   BUN  Date Value Ref Range Status  02/01/2023 12 8 - 27 mg/dL Final   Calcium  Date Value Ref Range Status  02/01/2023 8.5 (L) 8.7 - 10.3 mg/dL Final   CO2  Date Value Ref Range Status  02/01/2023 25 20 - 29 mmol/L Final   Bicarbonate  Date Value Ref Range Status  04/07/2018 20.1 20.0 - 28.0 mmol/L Corrected   Creatinine, Ser  Date Value Ref Range Status  02/01/2023 0.75 0.57 - 1.00 mg/dL Final   Glucose  Date Value Ref Range Status  02/01/2023 78 70 - 99 mg/dL Final   Glucose, Bld  Date Value Ref Range Status  07/18/2020 81 70 - 99 mg/dL Final    Comment:    Glucose reference range applies only to samples taken after fasting for at least 8 hours.   Glucose-Capillary  Date Value Ref Range Status  04/14/2018 84 70 - 99 mg/dL Final   Potassium  Date Value Ref Range Status  02/01/2023 3.9 3.5 - 5.2 mmol/L Final   Sodium  Date Value Ref Range Status  02/01/2023 139 134 - 144 mmol/L Final   Bilirubin Total  Date Value Ref Range Status  02/01/2023 <0.2 0.0 - 1.2 mg/dL Final   Bilirubin, Direct  Date Value Ref Range Status  04/05/2018 0.2 0.0 - 0.2 mg/dL Final    Comment:    Please note change in reference range.   Indirect Bilirubin  Date Value Ref Range Status  04/05/2018 0.4 0.3 - 0.9 mg/dL Final    Comment:    Performed at [facility], [address] [place], [place] [address]   Protein, ur  Date Value Ref Range Status  04/05/2018 30 (A) NEGATIVE mg/dL Final   Protein,UA  Date Value Ref Range Status  07/30/2022 1+ (A) Negative/Trace Final   Total Protein  Date Value Ref Range Status  02/01/2023 6.4 6.0 - 8.5 g/dL Final   GFR calc Af Amer  Date Value Ref Range Status   11/24/2019 57 (L) >59 mL/min/1.73 Final   eGFR  Date Value Ref Range Status  02/01/2023 85 >59 mL/min/1.73 Final   GFR, Estimated  Date Value Ref Range Status  07/18/2020 >60 >60 mL/min Final    Comment:    (NOTE) Calculated using the CKD-EPI Creatinine Equation (2021)           QUEtiapine Fumarate (SEROQUEL XR) 150 MG 24 hr tablet [Pharmacy Med Name: QUETIAPINE ER 150 MG TABLET] 90 tablet 1    Sig: TAKE 1 TABLET BY MOUTH AT BEDTIME.     Not Delegated - Psychiatry:  Antipsychotics - Second Generation (Atypical) -  quetiapine Failed - 08/13/2023  2:06 PM      Failed - This refill cannot be delegated      Failed - Lipid Panel in normal range within the last 12 months    Cholesterol, Total  Date Value Ref Range Status  02/01/2023 177 100 - 199 mg/dL Final   Cholesterol Piccolo, Waived  Date Value Ref Range Status  11/12/2016 173 <200 mg/dL Final    Comment:                            Desirable                <200                         Borderline High      200- 239                         High                     >239    LDL Chol Calc (NIH)  Date Value Ref Range Status  02/01/2023 114 (H) 0 - 99 mg/dL Final   HDL  Date Value Ref Range Status  02/01/2023 38 (L) >39 mg/dL Final   Triglycerides  Date Value Ref Range Status  02/01/2023 137 0 - 149 mg/dL Final   Triglycerides Piccolo,Waived  Date Value Ref Range Status  11/12/2016 210 (H) <150 mg/dL Final    Comment:                            Normal                   <150                         Borderline High     150 - 199                         High                200 - 499                         Very High                >499          Failed - CBC within normal limits and completed in the last 12 months    WBC  Date Value Ref Range Status  02/01/2023 5.1 3.4 - 10.8 x10E3/uL Final  07/18/2020 4.6 4.0 - 10.5 K/uL Final   RBC  Date Value Ref Range Status  02/01/2023 3.25 (L) 3.77 - 5.28 x10E6/uL  Final  07/18/2020 3.40 (L) 3.87 - 5.11 MIL/uL Final   Hemoglobin  Date Value Ref Range Status  02/01/2023 10.9 (L) 11.1 - 15.9 g/dL Final   Hematocrit  Date Value Ref Range Status  02/01/2023 32.5 (L) 34.0 - 46.6 % Final   MCHC  Date Value Ref Range Status  02/01/2023 33.5 31.5 - 35.7 g/dL Final  28/41/3244 01.0 30.0 - 36.0 g/dL Final   [facility]  Date Value Ref Range Status  02/01/2023 33.5 (H) 26.6 - 33.0 pg Final  07/18/2020 32.9 26.0 -  34.0 pg Final   MCV  Date Value Ref Range Status  02/01/2023 100 (H) 79 - 97 fL Final   No results found for: "PLTCOUNTKUC", "LABPLAT", "POCPLA" RDW  Date Value Ref Range Status  02/01/2023 11.5 (L) 11.7 - 15.4 % Final         Passed - TSH in normal range and within 360 days    TSH  Date Value Ref Range Status  02/01/2023 0.850 0.450 - 4.500 uIU/mL Final         Passed - Completed PHQ-2 or PHQ-9 in the last 360 days      Passed - Last BP in normal range    BP Readings from Last 1 Encounters:  03/26/23 116/70         Passed - Last Heart Rate in normal range    Pulse Readings from Last 1 Encounters:  03/26/23 72         Passed - Valid encounter within last 6 months    Recent Outpatient Visits           4 months ago Anxiety   [place] [facility] [place], Megan P, DO   5 months ago Chronic right shoulder pain   [facility] [place], Megan P, DO   6 months ago Fatigue due to depression   [place] [facility] [place], Sherran Needs, NP   11 months ago Anxiety   [place] [facility] [place], Megan [place], DO   1 year ago Encounter for annual wellness visit (AWV) in Medicare patient   [place] [facility] [place], [place], DO       Future Appointments             In 2 weeks Laural Benes, Oralia Rud, DO [place] [facility]            Passed - CMP within normal limits and completed in the last 12 months    Albumin  Date  Value Ref Range Status  02/01/2023 4.0 3.8 - 4.8 g/dL Final   Alkaline Phosphatase  Date Value Ref Range Status  02/01/2023 93 44 - 121 IU/L Final   ALT  Date Value Ref Range Status  02/01/2023 8 0 - 32 IU/L Final   ALT (SGPT) Piccolo, Waived  Date Value Ref Range Status  11/12/2016 15 10 - 47 U/L Final   AST  Date Value Ref Range Status  02/01/2023 13 0 - 40 IU/L Final   AST (SGOT) Piccolo, Waived  Date Value Ref Range Status  11/12/2016 19 11 - 38 U/L Final   BUN  Date Value Ref Range Status  02/01/2023 12 8 - 27 mg/dL Final   Calcium  Date Value Ref Range Status  02/01/2023 8.5 (L) 8.7 - 10.3 mg/dL Final   CO2  Date Value Ref Range Status  02/01/2023 25 20 - 29 mmol/L Final   Bicarbonate  Date Value Ref Range Status  04/07/2018 20.1 20.0 - 28.0 mmol/L Corrected   Creatinine, Ser  Date Value Ref Range Status  02/01/2023 0.75 0.57 - 1.00 mg/dL Final   Glucose  Date Value Ref Range Status  02/01/2023 78 70 - 99 mg/dL Final   Glucose, Bld  Date Value Ref Range Status  07/18/2020 81 70 - 99 mg/dL Final    Comment:    Glucose reference range applies only to samples taken after fasting for at least 8 hours.   Glucose-Capillary  Date Value  Ref Range Status  04/14/2018 84 70 - 99 mg/dL Final   Potassium  Date Value Ref Range Status  02/01/2023 3.9 3.5 - 5.2 mmol/L Final   Sodium  Date Value Ref Range Status  02/01/2023 139 134 - 144 mmol/L Final   Bilirubin Total  Date Value Ref Range Status  02/01/2023 <0.2 0.0 - 1.2 mg/dL Final   Bilirubin, Direct  Date Value Ref Range Status  04/05/2018 0.2 0.0 - 0.2 mg/dL Final    Comment:    Please note change in reference range.   Indirect Bilirubin  Date Value Ref Range Status  04/05/2018 0.4 0.3 - 0.9 mg/dL Final    Comment:    Performed at [facility], [address] [place], [place] [address]   Protein, ur  Date Value Ref Range Status  04/05/2018 30 (A) NEGATIVE mg/dL Final    Protein,UA  Date Value Ref Range Status  07/30/2022 1+ (A) Negative/Trace Final   Total Protein  Date Value Ref Range Status  02/01/2023 6.4 6.0 - 8.5 g/dL Final   GFR calc Af Amer  Date Value Ref Range Status  11/24/2019 57 (L) >59 mL/min/1.73 Final   eGFR  Date Value Ref Range Status  02/01/2023 85 >59 mL/min/1.73 Final   GFR, Estimated  Date Value Ref Range Status  07/18/2020 >60 >60 mL/min Final    Comment:    (NOTE) Calculated using the CKD-EPI Creatinine Equation (2021)

## 2023-08-17 NOTE — Telephone Encounter (Signed)
Tried contacting patient. No answer and VM full. Will try to call again.

## 2023-08-18 NOTE — Telephone Encounter (Signed)
Called and spoke to patient. She states that she will need the medications before her appointment on 08/30/23.

## 2023-08-30 ENCOUNTER — Encounter: Payer: Self-pay | Admitting: Family Medicine

## 2023-08-30 ENCOUNTER — Ambulatory Visit (INDEPENDENT_AMBULATORY_CARE_PROVIDER_SITE_OTHER): Payer: Medicare HMO | Admitting: Family Medicine

## 2023-08-30 ENCOUNTER — Telehealth: Payer: Self-pay

## 2023-08-30 VITALS — BP 100/67 | HR 93 | Ht 60.75 in | Wt 116.2 lb

## 2023-08-30 DIAGNOSIS — E559 Vitamin D deficiency, unspecified: Secondary | ICD-10-CM

## 2023-08-30 DIAGNOSIS — E785 Hyperlipidemia, unspecified: Secondary | ICD-10-CM | POA: Diagnosis not present

## 2023-08-30 DIAGNOSIS — E039 Hypothyroidism, unspecified: Secondary | ICD-10-CM | POA: Diagnosis not present

## 2023-08-30 DIAGNOSIS — Z780 Asymptomatic menopausal state: Secondary | ICD-10-CM

## 2023-08-30 DIAGNOSIS — F3342 Major depressive disorder, recurrent, in full remission: Secondary | ICD-10-CM | POA: Diagnosis not present

## 2023-08-30 DIAGNOSIS — Z1231 Encounter for screening mammogram for malignant neoplasm of breast: Secondary | ICD-10-CM

## 2023-08-30 DIAGNOSIS — Z0000 Encounter for general adult medical examination without abnormal findings: Secondary | ICD-10-CM | POA: Diagnosis not present

## 2023-08-30 DIAGNOSIS — E538 Deficiency of other specified B group vitamins: Secondary | ICD-10-CM | POA: Diagnosis not present

## 2023-08-30 MED ORDER — FEXOFENADINE HCL 180 MG PO TABS: 180.0000 mg | ORAL_TABLET | Freq: Every day | ORAL | 2 refills | Status: AC

## 2023-08-30 MED ORDER — QUETIAPINE FUMARATE ER 150 MG PO TB24: 150.0000 mg | ORAL_TABLET | Freq: Every day | ORAL | 1 refills | Status: DC

## 2023-08-30 MED ORDER — PANTOPRAZOLE SODIUM 40 MG PO TBEC: 40.0000 mg | DELAYED_RELEASE_TABLET | Freq: Two times a day (BID) | ORAL | 1 refills | Status: DC

## 2023-08-30 MED ORDER — ROSUVASTATIN CALCIUM 10 MG PO TABS: 10.0000 mg | ORAL_TABLET | Freq: Every day | ORAL | 1 refills | Status: DC

## 2023-08-30 MED ORDER — TRAZODONE HCL 100 MG PO TABS: ORAL_TABLET | ORAL | 1 refills | Status: DC

## 2023-08-30 MED ORDER — GABAPENTIN 300 MG PO CAPS: ORAL_CAPSULE | ORAL | 1 refills | Status: DC

## 2023-08-30 NOTE — Assessment & Plan Note (Signed)
Rechecking labs today. Await results. Treat as needed.  °

## 2023-08-30 NOTE — Assessment & Plan Note (Signed)
Under good control on current regimen. Continue current regimen. Continue to monitor. Call with any concerns. Refills given. Labs drawn today.   

## 2023-08-30 NOTE — Telephone Encounter (Signed)
-----   Message from Olevia Perches sent at 08/30/2023  2:11 PM EST ----- Can we please see about her getting her mammo and bone density done at [facility], [place], [place] [phone] She can do any AM- she has not had a mammogram since 2013 and it was done at [facility] main campus

## 2023-08-30 NOTE — Progress Notes (Signed)
BP 100/67   Pulse 93   Ht 5' 0.75" (1.543 m)   Wt 116 lb 3.2 oz (52.7 kg)   SpO2 95%   BMI 22.14 kg/m    Subjective:    Patient ID: Annette Miller, female    DOB: Apr 15, 1951, 72 y.o.   MRN: [id number]  HPI: ARYANA Miller is a 72 y.o. female presenting on 08/30/2023 for comprehensive medical examination. Current medical complaints include:  Was diagnosed with pneumonia about 2 weeks ago. Started on amoxicillin and doxycycline. Starting to feel a little better, but still not great. No fever today- had one 2 days ago. Still coughing.   HYPOTHYROIDISM Thyroid control status:controlled Satisfied with current treatment? yes Medication side effects: no Medication compliance: excellent compliance Recent dose adjustment:no Fatigue: no Cold intolerance: no Heat intolerance: no Weight gain: no Weight loss: no Constipation: no Diarrhea/loose stools: no Palpitations: no Lower extremity edema: no Anxiety/depressed mood: no  DEPRESSION Mood status: controlled Satisfied with current treatment?: yes Symptom severity: mild  Duration of current treatment : chronic Side effects: no Medication compliance: excellent compliance Psychotherapy/counseling: no  Previous psychiatric medications: Seroquel Depressed mood: no Anxious mood: no Anhedonia: no Significant weight loss or gain: no Insomnia: yes  Fatigue: yes Feelings of worthlessness or guilt: no Impaired concentration/indecisiveness: no Suicidal ideations: no Hopelessness: no Crying spells: no    08/30/2023    1:42 PM 08/12/2023    9:12 AM 03/26/2023   10:45 AM 02/01/2023    3:44 PM 09/04/2022    3:37 PM  Depression screen PHQ 2/9  Decreased Interest 1 0 0 1 1  Down, Depressed, Hopeless 2 0 1 2 1   PHQ - 2 Score 3 0 1 3 2   Altered sleeping 3 0 3 3 2   Tired, decreased energy 3 0 0 3 1  Change in appetite 2 0 0 2 2  Feeling bad or failure about yourself  1 0 0 2 1  Trouble concentrating 0 0 0 1 1  Moving slowly or  fidgety/restless 0 0 1 1 0  Suicidal thoughts 0 0 0 0 0  PHQ-9 Score 12 0 5 15 9   Difficult doing work/chores  Not difficult at all Not difficult at all Not difficult at all Somewhat difficult   HYPERLIPIDEMIA Hyperlipidemia status: excellent compliance Satisfied with current treatment?  yes Side effects:  no Medication compliance: excellent compliance Past cholesterol meds: crestor Supplements: niacin Aspirin:  no The 10-year ASCVD risk score (Arnett DK, et al., 2019) is: 11.6%   Values used to calculate the score:     Age: 72 years     Sex: Female     Is Non-Hispanic African American: No     Diabetic: No     Tobacco smoker: Yes     Systolic Blood Pressure: 100 mmHg     Is BP treated: No     HDL Cholesterol: 38 mg/dL     Total Cholesterol: 177 mg/dL Chest pain:  no Coronary artery disease:  no  Menopausal Symptoms: no  Depression Screen done today and results listed below:     08/30/2023    1:42 PM 08/12/2023    9:12 AM 03/26/2023   10:45 AM 02/01/2023    3:44 PM 09/04/2022    3:37 PM  Depression screen PHQ 2/9  Decreased Interest 1 0 0 1 1  Down, Depressed, Hopeless 2 0 1 2 1   PHQ - 2 Score 3 0 1 3 2   Altered sleeping 3 0 3 3  2  Tired, decreased energy 3 0 0 3 1  Change in appetite 2 0 0 2 2  Feeling bad or failure about yourself  1 0 0 2 1  Trouble concentrating 0 0 0 1 1  Moving slowly or fidgety/restless 0 0 1 1 0  Suicidal thoughts 0 0 0 0 0  PHQ-9 Score 12 0 5 15 9   Difficult doing work/chores  Not difficult at all Not difficult at all Not difficult at all Somewhat difficult    Past Medical History:  Past Medical History:  Diagnosis Date   Allergy    Anemia    improved since surgery in May 2019   Anxiety    Arthritis    Depression    GERD (gastroesophageal reflux disease) 2019   PUD, history of diverticulitis   Goiter    H/O cardiac arrest 03/2018   s/p surgery and while in hospital. no cardiac history   Headache    History of blood transfusion  01/2018   Hyperlipidemia    Hypothyroidism    Osteoporosis    Pneumonia     Surgical History:  Past Surgical History:  Procedure Laterality Date   ABDOMINAL HYSTERECTOMY  1979   APPENDECTOMY  01/2018   COLONOSCOPY WITH PROPOFOL N/A 01/14/2018   Procedure: COLONOSCOPY WITH PROPOFOL;  Surgeon: Toney Reil, MD;  Location: [facility] ENDOSCOPY;  Service: Gastroenterology;  Laterality: N/A;   COLONOSCOPY WITH PROPOFOL N/A 06/24/2018   Procedure: COLONOSCOPY WITH PROPOFOL;  Surgeon: [name], MD;  Location: [facility] ENDOSCOPY;  Service: Gastroenterology;  Laterality: N/A;   COLONOSCOPY WITH PROPOFOL N/A 05/24/2019   Procedure: COLONOSCOPY WITH PROPOFOL;  Surgeon: [name], MD;  Location: [facility] ENDOSCOPY;  Service: Gastroenterology;  Laterality: N/A;   COLOSTOMY TAKEDOWN N/A 08/30/2018   Procedure: COLOSTOMY TAKEDOWN;  Surgeon: Leafy Ro, MD;  Location: [facility] ORS;  Service: General;  Laterality: N/A;   DIAGNOSTIC LAPAROSCOPY     ESOPHAGOGASTRODUODENOSCOPY N/A 04/06/2018   Procedure: ESOPHAGOGASTRODUODENOSCOPY (EGD);  Surgeon: Toney Reil, MD;  Location: [facility] ENDOSCOPY;  Service: Gastroenterology;  Laterality: N/A;   ESOPHAGOGASTRODUODENOSCOPY (EGD) WITH PROPOFOL N/A 01/14/2018   Procedure: ESOPHAGOGASTRODUODENOSCOPY (EGD) WITH PROPOFOL;  Surgeon: Toney Reil, MD;  Location: [facility] ENDOSCOPY;  Service: Gastroenterology;  Laterality: N/A;   ESOPHAGOGASTRODUODENOSCOPY (EGD) WITH PROPOFOL N/A 06/24/2018   Procedure: ESOPHAGOGASTRODUODENOSCOPY (EGD) WITH PROPOFOL;  Surgeon: [name], MD;  Location: [facility] ENDOSCOPY;  Service: Gastroenterology;  Laterality: N/A;   FLEXIBLE SIGMOIDOSCOPY N/A 06/28/2019   Procedure: FLEXIBLE SIGMOIDOSCOPY with Dilation;  Surgeon: [name], MD;  Location: [facility] ENDOSCOPY;  Service: Gastroenterology;  Laterality: N/A;   FLEXIBLE SIGMOIDOSCOPY N/A 07/20/2019   Procedure: FLEXIBLE SIGMOIDOSCOPY;  Surgeon: [name], MD;  Location: [facility] ENDOSCOPY;   Service: Gastroenterology;  Laterality: N/A;   INCISION AND DRAINAGE ABSCESS Right 07/18/2020   Procedure: INCISION AND DRAINAGE ABSCESS, axillary abscess;  Surgeon: Leafy Ro, MD;  Location: [facility] ORS;  Service: General;  Laterality: Right;   LAPAROTOMY N/A 02/25/2018   Procedure: EXPLORATORY LAPAROTOMY, PARTMAN'S PROCEDURE, APPENDECTOMY, COLOSTOMY;  Surgeon: Star Age, DO;  Location: [facility] ORS;  Service: General;  Laterality: N/A;   TONSILLECTOMY  1976   VENTRAL HERNIA REPAIR N/A 08/30/2018   Procedure: HERNIA REPAIR VENTRAL ADULT;  Surgeon: Leafy Ro, MD;  Location: [facility] ORS;  Service: General;  Laterality: N/A;   VISCERAL ANGIOGRAPHY N/A 04/07/2018   Procedure: VISCERAL ANGIOGRAPHY;  Surgeon: Annice Needy, MD;  Location: [facility] INVASIVE CV LAB;  Service: Cardiovascular;  Laterality: N/A;  Medications:  Current Outpatient Medications on File Prior to Visit  Medication Sig   acetaminophen (TYLENOL) 325 MG tablet Take 650 mg by mouth every 6 (six) hours as needed for mild pain or fever.   albuterol (VENTOLIN HFA) 108 (90 Base) MCG/ACT inhaler TAKE 2 PUFFS BY MOUTH EVERY 6 HOURS AS NEEDED FOR WHEEZE OR SHORTNESS OF BREATH   amoxicillin-clavulanate (AUGMENTIN) 875-125 MG tablet Take 1 tablet by mouth 2 (two) times daily.   Ascorbic Acid (VITAMIN C) 1000 MG tablet Take 3,000 mg by mouth daily.   Calcium Carb-Cholecalciferol (OYSTER SHELL CALCIUM PLUS D PO) Take 1 tablet by mouth daily.    cyclobenzaprine (FLEXERIL) 5 MG tablet TAKE 1 TABLET BY MOUTH EVERYDAY AT BEDTIME   Docusate Sodium (COLACE PO) Take 1 Dose by mouth daily.   doxycycline (ADOXA) 100 MG tablet PLEASE SEE ATTACHED FOR DETAILED DIRECTIONS   fluticasone (FLONASE) 50 MCG/ACT nasal spray Place 2 sprays into both nostrils daily.   levothyroxine (SYNTHROID) 25 MCG tablet Take 1 tablet (25 mcg total) by mouth daily before breakfast.   magnesium 30 MG tablet Take 30 mg by mouth daily.    Multiple Vitamin (MULTIVITAMIN WITH  MINERALS) TABS tablet Take 1 tablet by mouth daily.   niacin 250 MG CR capsule Take 250 mg by mouth at bedtime.   ondansetron (ZOFRAN) 4 MG tablet DISSOLVE 1 TABLET IN MOUTH AS NEEDED FOR NAUSEA UP TO 2 TIMES A DAY   promethazine (PHENERGAN) 25 MG suppository Place 1 suppository (25 mg total) rectally every 6 (six) hours as needed for nausea or vomiting.   Selenium 200 MCG CAPS Take 200 mcg by mouth daily.    triamcinolone cream (KENALOG) 0.1 % Apply 1 application topically 2 (two) times daily.   vitamin B-12 (CYANOCOBALAMIN) 250 MCG tablet Take 250 mcg by mouth daily.   VITAMIN D PO Take by mouth. 1250 mcg   vitamin E 1000 UNIT capsule Take 1,000 Units by mouth 2 (two) times daily.   Zinc Sulfate (ZINC 15 PO) Take 15 mg by mouth daily.    No current facility-administered medications on file prior to visit.    Allergies:  Allergies  Allergen Reactions   Aspirin     HISTORY OF PUD, DIVERTICULITIS, BOWEL PERFORATION   Prednisone Other (See Comments)    Reaction: violence Can not take any kind of steroids   Codeine Diarrhea and Nausea And Vomiting   Tape Rash    Transpore tape: redness, irritation at site  Tegaderm somewhat okay. All tapes cause itching    Social History:  Social History   Socioeconomic History   Marital status: Widowed    Spouse name: Not on file   Number of children: 1   Years of education: Not on file   Highest education level: Associate degree: academic program  Occupational History    Comment: retired   Occupation: travel Engineer, civil (consulting)  Tobacco Use   Smoking status: Some Days    Current packs/day: 0.10    Average packs/day: 0.1 packs/day for 59.9 years (6.0 ttl pk-yrs)    Types: Cigarettes    Start date: 1965   Smokeless tobacco: Never   Tobacco comments:    smokes occasionally   Vaping Use   Vaping status: Never Used  Substance and Sexual Activity   Alcohol use: No   Drug use: No   Sexual activity: Not Currently  Other Topics Concern   Not on file   Social History Narrative   Travel nurse   Social  Determinants of Health   Financial Resource Strain: Low Risk  (08/12/2023)   Overall Financial Resource Strain (CARDIA)    Difficulty of Paying Living Expenses: Not hard at all  Food Insecurity: No Food Insecurity (08/12/2023)   Hunger Vital Sign    Worried About Running Out of Food in the Last Year: Never true    Ran Out of Food in the Last Year: Never true  Transportation Needs: No Transportation Needs (08/12/2023)   PRAPARE - Administrator, Civil Service (Medical): No    Lack of Transportation (Non-Medical): No  Physical Activity: Sufficiently Active (08/12/2023)   Exercise Vital Sign    Days of Exercise per Week: 3 days    Minutes of Exercise per Session: 60 min  Stress: Stress Concern Present (08/12/2023)   [employer] of Occupational Health - Occupational Stress Questionnaire    Feeling of Stress : Rather much  Social Connections: Socially Isolated (08/12/2023)   Social Connection and Isolation Panel [NHANES]    Frequency of Communication with Friends and Family: Never    Frequency of Social Gatherings with Friends and Family: Once a week    Attends Religious Services: Never    Database administrator or Organizations: No    Attends Banker Meetings: Never    Marital Status: Widowed  Intimate Partner Violence: Not At Risk (08/12/2023)   Humiliation, Afraid, Rape, and Kick questionnaire    Fear of Current or Ex-Partner: No    Emotionally Abused: No    Physically Abused: No    Sexually Abused: No   Social History   Tobacco Use  Smoking Status Some Days   Current packs/day: 0.10   Average packs/day: 0.1 packs/day for 59.9 years (6.0 ttl pk-yrs)   Types: Cigarettes   Start date: 1965  Smokeless Tobacco Never  Tobacco Comments   smokes occasionally    Social History   Substance and Sexual Activity  Alcohol Use No    Family History:  Family History  Problem Relation Age of Onset    Cancer Mother    AAA (abdominal aortic aneurysm) Father    Cancer Maternal Grandmother    Stroke Maternal Grandmother    Cancer Paternal Grandfather     Past medical history, surgical history, medications, allergies, family history and social history reviewed with patient today and changes made to appropriate areas of the chart.   Review of Systems  Constitutional: Negative.   HENT: Negative.    Eyes: Negative.   Respiratory:  Positive for cough. Negative for hemoptysis, sputum production, shortness of breath and wheezing.   Cardiovascular: Negative.   Gastrointestinal:  Positive for constipation, diarrhea and nausea. Negative for abdominal pain, blood in stool, heartburn, melena and vomiting.  Genitourinary: Negative.   Musculoskeletal: Negative.   Skin: Negative.   Neurological:  Positive for dizziness. Negative for tingling, tremors, sensory change, speech change, focal weakness, seizures, loss of consciousness, weakness and headaches.  Endo/Heme/Allergies: Negative.   Psychiatric/Behavioral: Negative.     All other ROS negative except what is listed above and in the HPI.      Objective:    BP 100/67   Pulse 93   Ht 5' 0.75" (1.543 m)   Wt 116 lb 3.2 oz (52.7 kg)   SpO2 95%   BMI 22.14 kg/m   Wt Readings from Last 3 Encounters:  08/30/23 116 lb 3.2 oz (52.7 kg)  08/12/23 106 lb (48.1 kg)  03/08/23 103 lb 3.2 oz (46.8 kg)  Physical Exam Vitals and nursing note reviewed.  Constitutional:      General: She is not in acute distress.    Appearance: Normal appearance. She is normal weight. She is not ill-appearing, toxic-appearing or diaphoretic.  HENT:     Head: Normocephalic and atraumatic.     Right Ear: Tympanic membrane, ear canal and external ear normal. There is no impacted cerumen.     Left Ear: Tympanic membrane, ear canal and external ear normal. There is no impacted cerumen.     Nose: Nose normal. No congestion or rhinorrhea.     Mouth/Throat:      Mouth: Mucous membranes are moist.     Pharynx: Oropharynx is clear. No oropharyngeal exudate or posterior oropharyngeal erythema.  Eyes:     General: No scleral icterus.       Right eye: No discharge.        Left eye: No discharge.     Extraocular Movements: Extraocular movements intact.     Conjunctiva/sclera: Conjunctivae normal.     Pupils: Pupils are equal, round, and reactive to light.  Neck:     Vascular: No carotid bruit.  Cardiovascular:     Rate and Rhythm: Normal rate and regular rhythm.     Pulses: Normal pulses.     Heart sounds: No murmur heard.    No friction rub. No gallop.  Pulmonary:     Effort: Pulmonary effort is normal. No respiratory distress.     Breath sounds: Normal breath sounds. No stridor. No wheezing, rhonchi or rales.  Chest:     Chest wall: No tenderness.  Abdominal:     General: Abdomen is flat. Bowel sounds are normal. There is no distension.     Palpations: Abdomen is soft. There is no mass.     Tenderness: There is no abdominal tenderness. There is no right CVA tenderness, left CVA tenderness, guarding or rebound.     Hernia: No hernia is present.  Genitourinary:    Comments: Breast and pelvic exams deferred with shared decision making Musculoskeletal:        General: No swelling, tenderness, deformity or signs of injury.     Cervical back: Normal range of motion and neck supple. No rigidity. No muscular tenderness.     Right lower leg: No edema.     Left lower leg: No edema.  Lymphadenopathy:     Cervical: No cervical adenopathy.  Skin:    General: Skin is warm and dry.     Capillary Refill: Capillary refill takes less than 2 seconds.     Coloration: Skin is not jaundiced or pale.     Findings: No bruising, erythema, lesion or rash.  Neurological:     General: No focal deficit present.     Mental Status: She is alert and oriented to person, place, and time. Mental status is at baseline.     Cranial Nerves: No cranial nerve deficit.      Sensory: No sensory deficit.     Motor: No weakness.     Coordination: Coordination normal.     Gait: Gait normal.     Deep Tendon Reflexes: Reflexes normal.  Psychiatric:        Mood and Affect: Mood normal.        Behavior: Behavior normal.        Thought Content: Thought content normal.        Judgment: Judgment normal.     Results for orders placed or performed in visit on  02/01/23  Lipid Panel w/o Chol/HDL Ratio  Result Value Ref Range   Cholesterol, Total 177 100 - 199 mg/dL   Triglycerides 601 0 - 149 mg/dL   HDL 38 (L) >09 mg/dL   VLDL Cholesterol Cal 25 5 - 40 mg/dL   LDL Chol Calc (NIH) 323 (H) 0 - 99 mg/dL  Vitamin D (25 hydroxy)  Result Value Ref Range   Vit D, 25-Hydroxy 62.8 30.0 - 100.0 ng/mL  Vitamin B12  Result Value Ref Range   Vitamin B-12 360 232 - 1,245 pg/mL  Comp Met (CMET)  Result Value Ref Range   Glucose 78 70 - 99 mg/dL   BUN 12 8 - 27 mg/dL   Creatinine, Ser 5.57 0.57 - 1.00 mg/dL   eGFR 85 >32 KG/URK/2.70   BUN/Creatinine Ratio 16 12 - 28   Sodium 139 134 - 144 mmol/L   Potassium 3.9 3.5 - 5.2 mmol/L   Chloride 105 96 - 106 mmol/L   CO2 25 20 - 29 mmol/L   Calcium 8.5 (L) 8.7 - 10.3 mg/dL   Total Protein 6.4 6.0 - 8.5 g/dL   Albumin 4.0 3.8 - 4.8 g/dL   Globulin, Total 2.4 1.5 - 4.5 g/dL   Albumin/Globulin Ratio 1.7 1.2 - 2.2   Bilirubin Total <0.2 0.0 - 1.2 mg/dL   Alkaline Phosphatase 93 44 - 121 IU/L   AST 13 0 - 40 IU/L   ALT 8 0 - 32 IU/L  CBC With Diff/Platelet  Result Value Ref Range   WBC 5.1 3.4 - 10.8 x10E3/uL   RBC 3.25 (L) 3.77 - 5.28 x10E6/uL   Hemoglobin 10.9 (L) 11.1 - 15.9 g/dL   Hematocrit 62.3 (L) 76.2 - 46.6 %   MCV 100 (H) 79 - 97 fL   MCH 33.5 (H) 26.6 - 33.0 pg   MCHC 33.5 31.5 - 35.7 g/dL   RDW 83.1 (L) 51.7 - 61.6 %   Platelets 346 150 - 450 x10E3/uL   Neutrophils 56 Not Estab. %   Lymphs 35 Not Estab. %   Monocytes 5 Not Estab. %   Eos 4 Not Estab. %   Basos 0 Not Estab. %   Neutrophils Absolute 2.8  1.4 - 7.0 x10E3/uL   Lymphocytes Absolute 1.8 0.7 - 3.1 x10E3/uL   Monocytes Absolute 0.2 0.1 - 0.9 x10E3/uL   EOS (ABSOLUTE) 0.2 0.0 - 0.4 x10E3/uL   Basophils Absolute 0.0 0.0 - 0.2 x10E3/uL   Immature Granulocytes 0 Not Estab. %   Immature Grans (Abs) 0.0 0.0 - 0.1 x10E3/uL  TSH  Result Value Ref Range   TSH 0.850 0.450 - 4.500 uIU/mL      Assessment & Plan:   Problem List Items Addressed This Visit       Endocrine   Hypothyroidism    Rechecking labs today. Await results. Treat as needed.       Relevant Orders   CBC with Differential/Platelet   TSH     Other   Hyperlipidemia    Under good control on current regimen. Continue current regimen. Continue to monitor. Call with any concerns. Refills given. Labs drawn today.        Relevant Medications   rosuvastatin (CRESTOR) 10 MG tablet   Other Relevant Orders   CBC with Differential/Platelet   Comprehensive metabolic panel   Lipid Panel w/o Chol/HDL Ratio   Depression    Under good control on current regimen. Continue current regimen. Continue to monitor. Call with any concerns. Refills given.  Labs drawn today.        Relevant Medications   traZODone (DESYREL) 100 MG tablet   Other Relevant Orders   CBC with Differential/Platelet   B12 deficiency    Rechecking labs today. Await results. Treat as needed.       Relevant Orders   CBC with Differential/Platelet   B12   Vitamin D deficiency    Rechecking labs today. Await results. Treat as needed.       Relevant Orders   CBC with Differential/Platelet   VITAMIN D 25 Hydroxy (Vit-D Deficiency, Fractures)   Other Visit Diagnoses     Routine general medical examination at a health care facility    -  Primary   Vaccines up to date. Screening labs checked today. Mammo and DEXA ordered. Continue diet and exercise. Call with any concerns.   Encounter for screening mammogram for malignant neoplasm of breast       Will see about getting her scheduled out in  [place] where she is working. Ordered today.   Relevant Orders   MM 3D SCREENING MAMMOGRAM BILATERAL BREAST   Postmenopausal estrogen deficiency       Will see about getting her scheduled out in [place] where she is working. Ordered today.   Relevant Orders   DG Bone Density        Follow up plan: Return in about 6 months (around 02/28/2024).   LABORATORY TESTING:  - Pap smear: not applicable  IMMUNIZATIONS:   - Tdap: Tetanus vaccination status reviewed: last tetanus booster within 10 years. - Influenza: Up to date - Pneumovax: Up to date - Prevnar: Up to date - COVID: Up to date - HPV: Not applicable - Shingrix vaccine: Up to date  SCREENING: -Mammogram: Ordered today  - Colonoscopy: Up to date  - Bone Density: Ordered today   PATIENT COUNSELING:   Advised to take 1 mg of folate supplement per day if capable of pregnancy.   Sexuality: Discussed sexually transmitted diseases, partner selection, use of condoms, avoidance of unintended pregnancy  and contraceptive alternatives.   Advised to avoid cigarette smoking.  I discussed with the patient that most people either abstain from alcohol or drink within safe limits (<=14/week and <=4 drinks/occasion for males, <=7/weeks and <= 3 drinks/occasion for females) and that the risk for alcohol disorders and other health effects rises proportionally with the number of drinks per week and how often a drinker exceeds daily limits.  Discussed cessation/primary prevention of drug use and availability of treatment for abuse.   Diet: Encouraged to adjust caloric intake to maintain  or achieve ideal body weight, to reduce intake of dietary saturated fat and total fat, to limit sodium intake by avoiding high sodium foods and not adding table salt, and to maintain adequate dietary potassium and calcium preferably from fresh fruits, vegetables, and low-fat dairy products.    stressed the importance of regular exercise  Injury  prevention: Discussed safety belts, safety helmets, smoke detector, smoking near bedding or upholstery.   Dental health: Discussed importance of regular tooth brushing, flossing, and dental visits.    NEXT PREVENTATIVE PHYSICAL DUE IN 1 YEAR. Return in about 6 months (around 02/28/2024).

## 2023-08-31 ENCOUNTER — Telehealth: Payer: Self-pay

## 2023-08-31 LAB — LIPID PANEL W/O CHOL/HDL RATIO
Cholesterol, Total: 174 mg/dL (ref 100–199)
HDL: 57 mg/dL (ref >39)
LDL Chol Calc (NIH): 93 mg/dL (ref 0–99)
Triglycerides: 135 mg/dL (ref 0–149)
VLDL Cholesterol Cal: 24 mg/dL (ref 5–40)

## 2023-08-31 LAB — CBC WITH DIFFERENTIAL/PLATELET
Basophils Absolute: 0 10*3/uL (ref 0.0–0.2)
Basos: 0 %
EOS (ABSOLUTE): 0.1 10*3/uL (ref 0.0–0.4)
Eos: 2 %
Hematocrit: 35.8 % (ref 34.0–46.6)
Hemoglobin: 11.4 g/dL (ref 11.1–15.9)
Immature Grans (Abs): 0 10*3/uL (ref 0.0–0.1)
Immature Granulocytes: 0 %
Lymphocytes Absolute: 1.2 10*3/uL (ref 0.7–3.1)
Lymphs: 19 %
MCH: 32.2 pg (ref 26.6–33.0)
MCHC: 31.8 g/dL (ref 31.5–35.7)
MCV: 101 fL — ABNORMAL HIGH (ref 79–97)
Monocytes Absolute: 0.4 10*3/uL (ref 0.1–0.9)
Monocytes: 6 %
Neutrophils Absolute: 4.4 10*3/uL (ref 1.4–7.0)
Neutrophils: 73 %
Platelets: 318 10*3/uL (ref 150–450)
RBC: 3.54 x10E6/uL — ABNORMAL LOW (ref 3.77–5.28)
RDW: 11.8 % (ref 11.7–15.4)
WBC: 6.1 10*3/uL (ref 3.4–10.8)

## 2023-08-31 LAB — COMPREHENSIVE METABOLIC PANEL
ALT: 9 [IU]/L (ref 0–32)
AST: 15 [IU]/L (ref 0–40)
Albumin: 3.9 g/dL (ref 3.8–4.8)
Alkaline Phosphatase: 103 [IU]/L (ref 44–121)
BUN/Creatinine Ratio: 16 (ref 12–28)
BUN: 16 mg/dL (ref 8–27)
Bilirubin Total: <0.2 mg/dL (ref 0.0–1.2)
CO2: 23 mmol/L (ref 20–29)
Calcium: 9.2 mg/dL (ref 8.7–10.3)
Chloride: 101 mmol/L (ref 96–106)
Creatinine, Ser: 0.97 mg/dL (ref 0.57–1.00)
Globulin, Total: 2.7 g/dL (ref 1.5–4.5)
Glucose: 87 mg/dL (ref 70–99)
Potassium: 4.3 mmol/L (ref 3.5–5.2)
Sodium: 139 mmol/L (ref 134–144)
Total Protein: 6.6 g/dL (ref 6.0–8.5)
eGFR: 62 mL/min/{1.73_m2} (ref >59)

## 2023-08-31 LAB — VITAMIN D 25 HYDROXY (VIT D DEFICIENCY, FRACTURES): Vit D, 25-Hydroxy: 42.7 ng/mL (ref 30.0–100.0)

## 2023-08-31 LAB — VITAMIN B12: Vitamin B-12: 486 pg/mL (ref 232–1245)

## 2023-08-31 LAB — TSH: TSH: 1.14 u[IU]/mL (ref 0.450–4.500)

## 2023-08-31 NOTE — Telephone Encounter (Signed)
-----   Message from Olevia Perches sent at 08/30/2023  2:11 PM EST ----- Can we please see about her getting her mammo and bone density done at [facility], [place], [place] [phone] She can do any AM- she has not had a mammogram since 2013 and it was done at [facility] main campus

## 2023-08-31 NOTE — Telephone Encounter (Signed)
Spoke with Maxine Glenn at [facility] and was informed to fax over the orders to [phone] ATTN: [employer] for the patient to get scheduled for a Mammogram and DEXA. Orders printed and placed in provider's folder for signature.

## 2023-08-31 NOTE — Telephone Encounter (Signed)
Requested orders have been signed and faxed over to listed fax information. Annette Miller says once received they said will reach to patient to get scheduled.

## 2023-08-31 NOTE — Telephone Encounter (Signed)
Called to get patient scheduled for an appointment. Unable to leave voice message, will attempt again later at [phone].

## 2023-09-06 ENCOUNTER — Other Ambulatory Visit: Payer: Self-pay | Admitting: Family Medicine

## 2023-09-06 MED ORDER — LEVOTHYROXINE SODIUM 25 MCG PO TABS: 25.0000 ug | ORAL_TABLET | Freq: Every day | ORAL | 3 refills | Status: DC

## 2023-09-09 ENCOUNTER — Telehealth: Payer: Self-pay | Admitting: Family Medicine

## 2023-09-09 NOTE — Telephone Encounter (Signed)
Copied from CRM [id number]. Topic: Appointment Scheduling - Scheduling Inquiry for Clinic >> Sep 09, 2023  8:39 AM Annette Miller wrote: Reason for CRM: Annette Miller with [facility] in [place] called in looking for Annette Miller as she received an order for a mammogram and DG chest 2. She says they are over 2 hours away and just making sure she meant to send it to them. She did say they do not do chest x rays there. Please assist patient further

## 2023-09-09 NOTE — Telephone Encounter (Signed)
Spoke with [employer] from [employer] in [place], [place]. 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 Monica verbalized understanding and says she will reach out to the patient to get her scheduled.

## 2023-09-14 ENCOUNTER — Other Ambulatory Visit: Payer: Self-pay | Admitting: Family Medicine

## 2023-09-15 NOTE — Telephone Encounter (Signed)
Requested medication (s) are due for refill today: yes  Requested medication (s) are on the active medication list: yes  Last refill:  08/19/23 #30  Future visit scheduled: yes  Notes to clinic:  Med not delegated to NT to RF   Requested Prescriptions  Pending Prescriptions Disp Refills   ondansetron (ZOFRAN) 4 MG tablet [Pharmacy Med Name: ONDANSETRON HCL 4 MG TABLET] 30 tablet 0    Sig: DISSOLVE 1 TABLET IN MOUTH AS NEEDED FOR NAUSEA UP TO 2 TIMES A DAY     Not Delegated - Gastroenterology: Antiemetics - ondansetron Failed - 09/15/2023  9:47 AM      Failed - This refill cannot be delegated      Passed - AST in normal range and within 360 days    AST  Date Value Ref Range Status  08/30/2023 15 0 - 40 IU/L Final   AST (SGOT) Piccolo, Waived  Date Value Ref Range Status  11/12/2016 19 11 - 38 U/L Final         Passed - ALT in normal range and within 360 days    ALT  Date Value Ref Range Status  08/30/2023 9 0 - 32 IU/L Final   ALT (SGPT) Piccolo, Waived  Date Value Ref Range Status  11/12/2016 15 10 - 47 U/L Final         Passed - Valid encounter within last 6 months    Recent Outpatient Visits           2 weeks ago Routine general medical examination at a health care facility   [facility] [place], Megan P, DO   5 months ago Anxiety   [place] [facility] [place], [place] P, DO   6 months ago Chronic right shoulder pain   [facility] [place], Megan P, DO   7 months ago Fatigue due to depression   [place] [facility] [place], Sherran Needs, NP   1 year ago Anxiety   [facility] [place], [place], DO       Future Appointments             In 5 months Laural Benes, Oralia Rud, DO [facility], [facility]

## 2023-09-16 ENCOUNTER — Other Ambulatory Visit: Payer: Self-pay | Admitting: Family Medicine

## 2023-09-16 NOTE — Telephone Encounter (Signed)
Requested medication (s) are due for refill today - no  Requested medication (s) are on the active medication list -yes- not at this dosing  Future visit scheduled -yes  Last refill: unsure  Notes to clinic: non delegated Rx, medication dosing is different from current dosing on medication list- may no longer be current dosing  Requested Prescriptions  Pending Prescriptions Disp Refills   QUEtiapine (SEROQUEL) 50 MG tablet [Pharmacy Med Name: QUETIAPINE FUMARATE 50 MG TAB] 30 tablet 0    Sig: TAKE 1 TABLET BY MOUTH EVERYDAY AT BEDTIME     Not Delegated - Psychiatry:  Antipsychotics - Second Generation (Atypical) - quetiapine Failed - 09/16/2023  9:32 AM      Failed - This refill cannot be delegated      Failed - Lipid Panel in normal range within the last 12 months    Cholesterol, Total  Date Value Ref Range Status  08/30/2023 174 100 - 199 mg/dL Final   Cholesterol Piccolo, Waived  Date Value Ref Range Status  11/12/2016 173 <200 mg/dL Final    Comment:                            Desirable                <200                         Borderline High      200- 239                         High                     >239    LDL Chol Calc (NIH)  Date Value Ref Range Status  08/30/2023 93 0 - 99 mg/dL Final   HDL  Date Value Ref Range Status  08/30/2023 57 >39 mg/dL Final   Triglycerides  Date Value Ref Range Status  08/30/2023 135 0 - 149 mg/dL Final   Triglycerides Piccolo,Waived  Date Value Ref Range Status  11/12/2016 210 (H) <150 mg/dL Final    Comment:                            Normal                   <150                         Borderline High     150 - 199                         High                200 - 499                         Very High                >499          Passed - TSH in normal range and within 360 days    TSH  Date Value Ref Range Status  08/30/2023 1.140 0.450 - 4.500 uIU/mL Final         Passed - Completed PHQ-2 or PHQ-9 in  the  last 360 days      Passed - Last BP in normal range    BP Readings from Last 1 Encounters:  08/30/23 100/67         Passed - Last Heart Rate in normal range    Pulse Readings from Last 1 Encounters:  08/30/23 93         Passed - Valid encounter within last 6 months    Recent Outpatient Visits           2 weeks ago Routine general medical examination at a health care facility   [facility], DO   5 months ago Anxiety   [place] [facility] [place], [place] P, DO   6 months ago Chronic right shoulder pain   [facility] [place], Megan P, DO   7 months ago Fatigue due to depression   [place] [facility] [place], Sherran Needs, NP   1 year ago Anxiety   [place] [facility] [place], [place], DO       Future Appointments             In 5 months Laural Benes, Oralia Rud, DO [place] [facility], [facility]            Passed - CBC within normal limits and completed in the last 12 months    WBC  Date Value Ref Range Status  08/30/2023 6.1 3.4 - 10.8 x10E3/uL Final  07/18/2020 4.6 4.0 - 10.5 K/uL Final   RBC  Date Value Ref Range Status  08/30/2023 3.54 (L) 3.77 - 5.28 x10E6/uL Final  07/18/2020 3.40 (L) 3.87 - 5.11 MIL/uL Final   Hemoglobin  Date Value Ref Range Status  08/30/2023 11.4 11.1 - 15.9 g/dL Final   Hematocrit  Date Value Ref Range Status  08/30/2023 35.8 34.0 - 46.6 % Final   MCHC  Date Value Ref Range Status  08/30/2023 31.8 31.5 - 35.7 g/dL Final  91/47/8295 62.1 30.0 - 36.0 g/dL Final   [facility]  Date Value Ref Range Status  08/30/2023 32.2 26.6 - 33.0 pg Final  07/18/2020 32.9 26.0 - 34.0 pg Final   MCV  Date Value Ref Range Status  08/30/2023 101 (H) 79 - 97 fL Final   No results found for: "PLTCOUNTKUC", "LABPLAT", "POCPLA" RDW  Date Value Ref Range Status  08/30/2023 11.8 11.7 - 15.4 % Final         Passed - CMP within  normal limits and completed in the last 12 months    Albumin  Date Value Ref Range Status  08/30/2023 3.9 3.8 - 4.8 g/dL Final   Alkaline Phosphatase  Date Value Ref Range Status  08/30/2023 103 44 - 121 IU/L Final   ALT  Date Value Ref Range Status  08/30/2023 9 0 - 32 IU/L Final   ALT (SGPT) Piccolo, Waived  Date Value Ref Range Status  11/12/2016 15 10 - 47 U/L Final   AST  Date Value Ref Range Status  08/30/2023 15 0 - 40 IU/L Final   AST (SGOT) Piccolo, Waived  Date Value Ref Range Status  11/12/2016 19 11 - 38 U/L Final   BUN  Date Value Ref Range Status  08/30/2023 16 8 - 27 mg/dL Final   Calcium  Date Value Ref Range Status  08/30/2023 9.2 8.7 - 10.3 mg/dL Final   CO2  Date Value Ref Range Status  08/30/2023 23  20 - 29 mmol/L Final   Bicarbonate  Date Value Ref Range Status  04/07/2018 20.1 20.0 - 28.0 mmol/L Corrected   Creatinine, Ser  Date Value Ref Range Status  08/30/2023 0.97 0.57 - 1.00 mg/dL Final   Glucose  Date Value Ref Range Status  08/30/2023 87 70 - 99 mg/dL Final   Glucose, Bld  Date Value Ref Range Status  07/18/2020 81 70 - 99 mg/dL Final    Comment:    Glucose reference range applies only to samples taken after fasting for at least 8 hours.   Glucose-Capillary  Date Value Ref Range Status  04/14/2018 84 70 - 99 mg/dL Final   Potassium  Date Value Ref Range Status  08/30/2023 4.3 3.5 - 5.2 mmol/L Final   Sodium  Date Value Ref Range Status  08/30/2023 139 134 - 144 mmol/L Final   Bilirubin Total  Date Value Ref Range Status  08/30/2023 <0.2 0.0 - 1.2 mg/dL Final   Bilirubin, Direct  Date Value Ref Range Status  04/05/2018 0.2 0.0 - 0.2 mg/dL Final    Comment:    Please note change in reference range.   Indirect Bilirubin  Date Value Ref Range Status  04/05/2018 0.4 0.3 - 0.9 mg/dL Final    Comment:    Performed at [facility], [address] [place], [place] [address]   Protein, ur  Date Value Ref  Range Status  04/05/2018 30 (A) NEGATIVE mg/dL Final   Protein,UA  Date Value Ref Range Status  07/30/2022 1+ (A) Negative/Trace Final   Total Protein  Date Value Ref Range Status  08/30/2023 6.6 6.0 - 8.5 g/dL Final   GFR calc Af Amer  Date Value Ref Range Status  11/24/2019 57 (L) >59 mL/min/1.73 Final   eGFR  Date Value Ref Range Status  08/30/2023 62 >59 mL/min/1.73 Final   GFR, Estimated  Date Value Ref Range Status  07/18/2020 >60 >60 mL/min Final    Comment:    (NOTE) Calculated using the CKD-EPI Creatinine Equation (2021)             Requested Prescriptions  Pending Prescriptions Disp Refills   QUEtiapine (SEROQUEL) 50 MG tablet [Pharmacy Med Name: QUETIAPINE FUMARATE 50 MG TAB] 30 tablet 0    Sig: TAKE 1 TABLET BY MOUTH EVERYDAY AT BEDTIME     Not Delegated - Psychiatry:  Antipsychotics - Second Generation (Atypical) - quetiapine Failed - 09/16/2023  9:32 AM      Failed - This refill cannot be delegated      Failed - Lipid Panel in normal range within the last 12 months    Cholesterol, Total  Date Value Ref Range Status  08/30/2023 174 100 - 199 mg/dL Final   Cholesterol Piccolo, Waived  Date Value Ref Range Status  11/12/2016 173 <200 mg/dL Final    Comment:                            Desirable                <200                         Borderline High      200- 239                         High                     >  239    LDL Chol Calc (NIH)  Date Value Ref Range Status  08/30/2023 93 0 - 99 mg/dL Final   HDL  Date Value Ref Range Status  08/30/2023 57 >39 mg/dL Final   Triglycerides  Date Value Ref Range Status  08/30/2023 135 0 - 149 mg/dL Final   Triglycerides Piccolo,Waived  Date Value Ref Range Status  11/12/2016 210 (H) <150 mg/dL Final    Comment:                            Normal                   <150                         Borderline High     150 - 199                         High                200 - 499                          Very High                >499          Passed - TSH in normal range and within 360 days    TSH  Date Value Ref Range Status  08/30/2023 1.140 0.450 - 4.500 uIU/mL Final         Passed - Completed PHQ-2 or PHQ-9 in the last 360 days      Passed - Last BP in normal range    BP Readings from Last 1 Encounters:  08/30/23 100/67         Passed - Last Heart Rate in normal range    Pulse Readings from Last 1 Encounters:  08/30/23 93         Passed - Valid encounter within last 6 months    Recent Outpatient Visits           2 weeks ago Routine general medical examination at a health care facility   [facility] [place], Megan P, DO   5 months ago Anxiety   [place] [facility] [place], [place] P, DO   6 months ago Chronic right shoulder pain   [place] [facility] [place], Megan P, DO   7 months ago Fatigue due to depression   [place] [facility] [place], Sherran Needs, NP   1 year ago Anxiety   [facility] [place], [place], DO       Future Appointments             In 5 months [place], Megan P, DO [place] [facility], [facility]            Passed - CBC within normal limits and completed in the last 12 months    WBC  Date Value Ref Range Status  08/30/2023 6.1 3.4 - 10.8 x10E3/uL Final  07/18/2020 4.6 4.0 - 10.5 K/uL Final   RBC  Date Value Ref Range Status  08/30/2023 3.54 (L) 3.77 - 5.28 x10E6/uL Final  07/18/2020 3.40 (L) 3.87 - 5.11 MIL/uL Final   Hemoglobin  Date Value Ref Range Status  08/30/2023 11.4  11.1 - 15.9 g/dL Final   Hematocrit  Date Value Ref Range Status  08/30/2023 35.8 34.0 - 46.6 % Final   MCHC  Date Value Ref Range Status  08/30/2023 31.8 31.5 - 35.7 g/dL Final  96/29/5284 13.2 30.0 - 36.0 g/dL Final   [facility]  Date Value Ref Range Status  08/30/2023 32.2 26.6 - 33.0 pg Final  07/18/2020 32.9 26.0 - 34.0 pg Final    MCV  Date Value Ref Range Status  08/30/2023 101 (H) 79 - 97 fL Final   No results found for: "PLTCOUNTKUC", "LABPLAT", "POCPLA" RDW  Date Value Ref Range Status  08/30/2023 11.8 11.7 - 15.4 % Final         Passed - CMP within normal limits and completed in the last 12 months    Albumin  Date Value Ref Range Status  08/30/2023 3.9 3.8 - 4.8 g/dL Final   Alkaline Phosphatase  Date Value Ref Range Status  08/30/2023 103 44 - 121 IU/L Final   ALT  Date Value Ref Range Status  08/30/2023 9 0 - 32 IU/L Final   ALT (SGPT) Piccolo, Waived  Date Value Ref Range Status  11/12/2016 15 10 - 47 U/L Final   AST  Date Value Ref Range Status  08/30/2023 15 0 - 40 IU/L Final   AST (SGOT) Piccolo, Waived  Date Value Ref Range Status  11/12/2016 19 11 - 38 U/L Final   BUN  Date Value Ref Range Status  08/30/2023 16 8 - 27 mg/dL Final   Calcium  Date Value Ref Range Status  08/30/2023 9.2 8.7 - 10.3 mg/dL Final   CO2  Date Value Ref Range Status  08/30/2023 23 20 - 29 mmol/L Final   Bicarbonate  Date Value Ref Range Status  04/07/2018 20.1 20.0 - 28.0 mmol/L Corrected   Creatinine, Ser  Date Value Ref Range Status  08/30/2023 0.97 0.57 - 1.00 mg/dL Final   Glucose  Date Value Ref Range Status  08/30/2023 87 70 - 99 mg/dL Final   Glucose, Bld  Date Value Ref Range Status  07/18/2020 81 70 - 99 mg/dL Final    Comment:    Glucose reference range applies only to samples taken after fasting for at least 8 hours.   Glucose-Capillary  Date Value Ref Range Status  04/14/2018 84 70 - 99 mg/dL Final   Potassium  Date Value Ref Range Status  08/30/2023 4.3 3.5 - 5.2 mmol/L Final   Sodium  Date Value Ref Range Status  08/30/2023 139 134 - 144 mmol/L Final   Bilirubin Total  Date Value Ref Range Status  08/30/2023 <0.2 0.0 - 1.2 mg/dL Final   Bilirubin, Direct  Date Value Ref Range Status  04/05/2018 0.2 0.0 - 0.2 mg/dL Final    Comment:    Please note change  in reference range.   Indirect Bilirubin  Date Value Ref Range Status  04/05/2018 0.4 0.3 - 0.9 mg/dL Final    Comment:    Performed at [facility], [address] [place], [place] [address]   Protein, ur  Date Value Ref Range Status  04/05/2018 30 (A) NEGATIVE mg/dL Final   Protein,UA  Date Value Ref Range Status  07/30/2022 1+ (A) Negative/Trace Final   Total Protein  Date Value Ref Range Status  08/30/2023 6.6 6.0 - 8.5 g/dL Final   GFR calc Af Amer  Date Value Ref Range Status  11/24/2019 57 (L) >59 mL/min/1.73 Final   eGFR  Date Value Ref Range Status  08/30/2023 62 >59 mL/min/1.73 Final   GFR, Estimated  Date Value Ref Range Status  07/18/2020 >60 >60 mL/min Final    Comment:    (NOTE) Calculated using the CKD-EPI Creatinine Equation (2021)

## 2023-09-28 ENCOUNTER — Other Ambulatory Visit: Payer: Self-pay | Admitting: Family Medicine

## 2023-09-28 DIAGNOSIS — M25572 Pain in left ankle and joints of left foot: Secondary | ICD-10-CM

## 2023-10-02 NOTE — Telephone Encounter (Signed)
 Requested medication (s) are due for refill today: Yes  Requested medication (s) are on the active medication list: Yes  Last refill:  08/19/23  Future visit scheduled: Yes  Notes to clinic:  Unable to refill per protocol, cannot delegate.      Requested Prescriptions  Pending Prescriptions Disp Refills   cyclobenzaprine  (FLEXERIL ) 5 MG tablet [Pharmacy Med Name: CYCLOBENZAPRINE  5 MG TABLET] 30 tablet 0    Sig: TAKE 1 TABLET BY MOUTH EVERYDAY AT BEDTIME     Not Delegated - Analgesics:  Muscle Relaxants Failed - 10/02/2023 10:31 AM      Failed - This refill cannot be delegated      Passed - Valid encounter within last 6 months    Recent Outpatient Visits           1 month ago Routine general medical examination at a health care facility   [facility] [place], Megan P, DO   6 months ago Anxiety   [place] [facility] [place], [place] P, DO   6 months ago Chronic right shoulder pain   [facility] [place], Megan P, DO   8 months ago Fatigue due to depression   [place] [facility] [place], Hyla Givens, NP   1 year ago Anxiety    [facility] [place], Duwaine SQUIBB, DO       Future Appointments             In 4 months Vicci, Duwaine SQUIBB, DO  [facility], [facility]

## 2023-10-03 ENCOUNTER — Other Ambulatory Visit: Payer: Self-pay | Admitting: Family Medicine

## 2023-10-04 ENCOUNTER — Telehealth: Payer: Self-pay | Admitting: Family Medicine

## 2023-10-04 NOTE — Telephone Encounter (Signed)
 Last office visit: 08/30/2023

## 2023-10-04 NOTE — Telephone Encounter (Signed)
 Pt called in checking status of refills, of cyclobenzaprine (FLEXERIL) 5 MG tablet  and ,ONDANSETRON HCL 4 MG TABLET] . I let her know these are being worked on.

## 2023-10-05 NOTE — Telephone Encounter (Signed)
 Requested medication (s) are due for refill today - unsure  Requested medication (s) are on the active medication list -yes  Future visit scheduled -yes  Last refill: 09/15/23 #30  Notes to clinic: non delegated Rx  Requested Prescriptions  Pending Prescriptions Disp Refills   ondansetron  (ZOFRAN ) 4 MG tablet [Pharmacy Med Name: ONDANSETRON  HCL 4 MG TABLET] 30 tablet 0    Sig: DISSOLVE 1 TABLET IN MOUTH AS NEEDED FOR NAUSEA UP TO 2 TIMES A DAY     Not Delegated - Gastroenterology: Antiemetics - ondansetron  Failed - 10/05/2023  9:21 AM      Failed - This refill cannot be delegated      Passed - AST in normal range and within 360 days    AST  Date Value Ref Range Status  08/30/2023 15 0 - 40 IU/L Final   AST (SGOT) Piccolo, Waived  Date Value Ref Range Status  11/12/2016 19 11 - 38 U/L Final         Passed - ALT in normal range and within 360 days    ALT  Date Value Ref Range Status  08/30/2023 9 0 - 32 IU/L Final   ALT (SGPT) Piccolo, Waived  Date Value Ref Range Status  11/12/2016 15 10 - 47 U/L Final         Passed - Valid encounter within last 6 months    Recent Outpatient Visits           1 month ago Routine general medical examination at a health care facility   [facility] [place], Megan P, DO   6 months ago Anxiety   [facility] [place], Megan P, DO   7 months ago Chronic right shoulder pain   [facility] [facility] [place], Megan P, DO   8 months ago Fatigue due to depression   [place] [facility] [place], Hyla Givens, NP   1 year ago Anxiety   [place] [place], Rachel, DO       Future Appointments             In 4 months Vicci, Duwaine SQUIBB, DO [place] [facility], [facility]               Requested Prescriptions  Pending Prescriptions Disp Refills   ondansetron  (ZOFRAN ) 4 MG tablet [Pharmacy Med Name: ONDANSETRON  HCL 4  MG TABLET] 30 tablet 0    Sig: DISSOLVE 1 TABLET IN MOUTH AS NEEDED FOR NAUSEA UP TO 2 TIMES A DAY     Not Delegated - Gastroenterology: Antiemetics - ondansetron  Failed - 10/05/2023  9:21 AM      Failed - This refill cannot be delegated      Passed - AST in normal range and within 360 days    AST  Date Value Ref Range Status  08/30/2023 15 0 - 40 IU/L Final   AST (SGOT) Piccolo, Waived  Date Value Ref Range Status  11/12/2016 19 11 - 38 U/L Final         Passed - ALT in normal range and within 360 days    ALT  Date Value Ref Range Status  08/30/2023 9 0 - 32 IU/L Final   ALT (SGPT) Piccolo, Waived  Date Value Ref Range Status  11/12/2016 15 10 - 47 U/L Final         Passed - Valid encounter within last 6 months    Recent  Outpatient Visits           1 month ago Routine general medical examination at a health care facility   [facility] [place], [place] P, DO   6 months ago Anxiety   [place] [place], [place] P, DO   7 months ago Chronic right shoulder pain   [place] [facility] [place], Megan P, DO   8 months ago Fatigue due to depression   [place] [place], Hyla Givens, NP   1 year ago Anxiety   [place] [facility] [place], Duwaine SQUIBB, DO       Future Appointments             In 4 months Vicci, Duwaine SQUIBB, DO [place] [facility]

## 2023-10-19 ENCOUNTER — Other Ambulatory Visit: Payer: Self-pay | Admitting: Family Medicine

## 2023-10-19 MED ORDER — ONDANSETRON HCL 4 MG PO TABS: ORAL_TABLET | ORAL | 0 refills | Status: DC

## 2023-10-19 NOTE — Telephone Encounter (Signed)
Pt following up on her request for ondansetron (ZOFRAN) 4 MG tablet . Pt got the flexeril, but not the other one. (Zofran)  Please send to  [id number] [place],  - [address]

## 2023-10-20 ENCOUNTER — Telehealth: Payer: Self-pay | Admitting: Family Medicine

## 2023-10-20 ENCOUNTER — Ambulatory Visit: Payer: Self-pay

## 2023-10-20 NOTE — Telephone Encounter (Signed)
    Chief Complaint: Dog jumped on her, injuring her chest. Symptoms: Above Frequency: Last night Pertinent Negatives: Patient denies  Disposition: [] ED /[] Urgent Care (no appt availability in office) / [x] Appointment(In office/virtual)/ []  [place] [facility]/ [] Home Care/ [] Refused Recommended Disposition /[] [place] [facility]/ []  Follow-up with PCP Additional Notes: Agrees with appointment.  Reason for Disposition  [1] MODERATE pain (e.g., interferes with normal activities) AND [2] high-risk adult (e.g., age > 60 years, osteoporosis, chronic steroid use)  Answer Assessment - Initial Assessment Questions 1. MECHANISM: "How did the injury happen?"     Dog jumped on her 2. ONSET: "When did the injury happen?" (Minutes or hours ago)     Last night 3. LOCATION: "Where on the chest is the injury located?"     Right, under breast 4. APPEARANCE: "What does the injury look like?"     ok 5. BLEEDING: "Is there any bleeding now? If Yes, ask: How long has it been bleeding?"     no 6. SEVERITY: "Any difficulty with breathing?"     Hurts with deep breath 7. SIZE: For cuts, bruises, or swelling, ask: "How large is it?" (e.g., inches or centimeters)     No 8. PAIN: "Is there pain?" If Yes, ask: "How bad is the pain?"   (e.g., Scale 1-10; or mild, moderate, severe)     8 9. TETANUS: For any breaks in the skin, ask: "When was the last tetanus booster?"     N/a 10. PREGNANCY: "Is there any chance you are pregnant?" "When was your last menstrual period?"       No  Protocols used: Chest Injury-A-AH

## 2023-10-20 NOTE — Telephone Encounter (Signed)
Copied from CRM [id number]. Topic: General - Inquiry >> Oct 19, 2023  3:39 PM Payton Doughty wrote: Reason for CRM: pt would like a copy of her immunization record and pick up Thursday.

## 2023-10-21 ENCOUNTER — Ambulatory Visit (INDEPENDENT_AMBULATORY_CARE_PROVIDER_SITE_OTHER): Payer: Medicare HMO | Admitting: Physician Assistant

## 2023-10-21 ENCOUNTER — Other Ambulatory Visit: Payer: Self-pay | Admitting: Family Medicine

## 2023-10-21 ENCOUNTER — Encounter: Payer: Self-pay | Admitting: Physician Assistant

## 2023-10-21 VITALS — BP 120/84 | HR 120 | Resp 16 | Ht 60.0 in | Wt 109.0 lb

## 2023-10-21 DIAGNOSIS — M25572 Pain in left ankle and joints of left foot: Secondary | ICD-10-CM

## 2023-10-21 DIAGNOSIS — R0781 Pleurodynia: Secondary | ICD-10-CM

## 2023-10-21 MED ORDER — METHOCARBAMOL 750 MG PO TABS: 750.0000 mg | ORAL_TABLET | Freq: Three times a day (TID) | ORAL | 0 refills | Status: AC | PRN

## 2023-10-21 NOTE — Progress Notes (Signed)
Acute Office Visit   Patient: Annette Miller   DOB: 06-13-1951   73 y.o. Female  MRN: [id number] Visit Date: 10/21/2023  Today's healthcare provider: Oswaldo Conroy Emorie Mcfate, PA-C  Introduced myself to the patient as a Secondary school teacher and provided education on APPs in clinical practice.    Chief Complaint  Patient presents with   Rib Injury    Dog jumped on patient causing pain under breast R rib side   Subjective    HPI HPI     Rib Injury    Additional comments: Dog jumped on patient causing pain under breast R rib side      Last edited by Dollene Primrose, CMA on 10/21/2023  3:35 PM.       Discussed the use of AI scribe software for clinical note transcription with the patient, who gave verbal consent to proceed.  History of Present Illness   The patient, a 73 year old with a history of multiple falls and injuries, presents with acute chest pain following a recent incident at home. The patient was knocked over by their pet dog, resulting in a fall onto a daybed. The patient did not experience immediate discomfort, but later that night, they woke up due to severe pain. The pain, rated as an 8 out of 10, is located in the chest and radiates to the scapula. The patient reports difficulty in taking deep breaths and coughing, which has affected their oxygen saturation levels.  The patient also reports a persistent itch on both arms, which they believe to be a form of psoriasis. They deny any pain associated with this skin condition.  The patient has been managing the chest pain with Tylenol and Flexeril, but reports that these medications have not been effective. They have a known allergy to prednisone and codeine, limiting their options for pain management.  The patient's medical history includes a previous arm injury and arthritis. They also have a history of multiple falls, one of which required staples for a head injury. The patient's kidney function was reported to be slightly low in the  last month, which may have been due to dehydration.  The patient is a retired Engineer, civil (consulting) and is aware of the limited treatment options for their current condition. They express a desire for a mild pain management strategy, refusing stronger medications like Percocet.         Medications: Outpatient Medications Prior to Visit  Medication Sig   acetaminophen (TYLENOL) 325 MG tablet Take 650 mg by mouth every 6 (six) hours as needed for mild pain or fever.   albuterol (VENTOLIN HFA) 108 (90 Base) MCG/ACT inhaler TAKE 2 PUFFS BY MOUTH EVERY 6 HOURS AS NEEDED FOR WHEEZE OR SHORTNESS OF BREATH   amoxicillin-clavulanate (AUGMENTIN) 875-125 MG tablet Take 1 tablet by mouth 2 (two) times daily.   Ascorbic Acid (VITAMIN C) 1000 MG tablet Take 3,000 mg by mouth daily.   Calcium Carb-Cholecalciferol (OYSTER SHELL CALCIUM PLUS D PO) Take 1 tablet by mouth daily.    cyclobenzaprine (FLEXERIL) 5 MG tablet TAKE 1 TABLET BY MOUTH EVERYDAY AT BEDTIME   Docusate Sodium (COLACE PO) Take 1 Dose by mouth daily.   doxycycline (ADOXA) 100 MG tablet PLEASE SEE ATTACHED FOR DETAILED DIRECTIONS   fexofenadine (ALLEGRA) 180 MG tablet Take 1 tablet (180 mg total) by mouth daily.   fluticasone (FLONASE) 50 MCG/ACT nasal spray Place 2 sprays into both nostrils daily.   gabapentin (NEURONTIN) 300 MG capsule Take  1 tab in the AM and PM and 2 pills QHS   levothyroxine (SYNTHROID) 25 MCG tablet Take 1 tablet (25 mcg total) by mouth daily before breakfast.   magnesium 30 MG tablet Take 30 mg by mouth daily.    Multiple Vitamin (MULTIVITAMIN WITH MINERALS) TABS tablet Take 1 tablet by mouth daily.   niacin 250 MG CR capsule Take 250 mg by mouth at bedtime.   ondansetron (ZOFRAN) 4 MG tablet DISSOLVE 1 TABLET IN MOUTH AS NEEDED FOR NAUSEA UP TO 2 TIMES A DAY   pantoprazole (PROTONIX) 40 MG tablet Take 1 tablet (40 mg total) by mouth 2 (two) times daily.   promethazine (PHENERGAN) 25 MG suppository Place 1 suppository (25 mg total)  rectally every 6 (six) hours as needed for nausea or vomiting.   QUEtiapine Fumarate (SEROQUEL XR) 150 MG 24 hr tablet Take 1 tablet (150 mg total) by mouth at bedtime.   rosuvastatin (CRESTOR) 10 MG tablet Take 1 tablet (10 mg total) by mouth daily.   Selenium 200 MCG CAPS Take 200 mcg by mouth daily.    traZODone (DESYREL) 100 MG tablet TAKE 1 TO 1&1/2 TABLETS (100-150 MG TOTAL) BY MOUTH AT BEDTIME AS NEEDED FOR SLEEPStrength: 100 mg   triamcinolone cream (KENALOG) 0.1 % Apply 1 application topically 2 (two) times daily.   vitamin B-12 (CYANOCOBALAMIN) 250 MCG tablet Take 250 mcg by mouth daily.   VITAMIN D PO Take by mouth. 1250 mcg   vitamin E 1000 UNIT capsule Take 1,000 Units by mouth 2 (two) times daily.   Zinc Sulfate (ZINC 15 PO) Take 15 mg by mouth daily.    No facility-administered medications prior to visit.    Review of Systems      Objective    BP 120/84   Pulse (!) 120   Resp 16   Ht 5' (1.524 m)   Wt 109 lb (49.4 kg)   SpO2 92%   BMI 21.29 kg/m     Physical Exam Vitals reviewed.  Constitutional:      Appearance: Normal appearance.  HENT:     Head: Normocephalic and atraumatic.  Pulmonary:     Effort: Pulmonary effort is normal.  Chest:     Comments: Right side of chest was examined with patient keeping breasts covered by bra.  No evidence of bruising, redness or trauma along right side of right breast, right side of chest or right scapula Tenderness with light palpation along the right side of her chest and middle of scapula She reports chronic limitation of right arm movements. She is able to fully abduct and adduct but flexion and extension, internal and external rotation are limited. She reports this is her normal ROM  Neurological:     Mental Status: She is alert.  Psychiatric:        Mood and Affect: Mood normal.        Behavior: Behavior normal.        Thought Content: Thought content normal.        Judgment: Judgment normal.       No  results found for any visits on 10/21/23.  Assessment & Plan      No follow-ups on file.         Problem List Items Addressed This Visit   None Visit Diagnoses       Rib pain on right side    -  Primary   Relevant Medications   methocarbamol (ROBAXIN-750) 750 MG tablet  Assessment and Plan    Chest Pain secondary to Rib Contusion Presents with chest pain rated 8/10 following trauma from being knocked over by a dog two nights ago. Pain localized to the chest and radiates to the scapula, exacerbated by deep breaths and coughing. Tenderness on palpation. No visible bruising noted. Differential diagnosis includes rib fracture. Primary concern is pain management and ensuring adequate breathing to prevent complications such as pneumonia. X-ray will check for rib fracture but treatment remains the same, patient declines this today. Advised against NSAIDs due to recent kidney function test showing GFR of 62, down from 85 in May 2024, possibly due to dehydration. Discussed alternative pain management options including Tylenol and Robaxin. Patient prefers to avoid strong medications like Percocet. -patient declines rib xray today  - Recommend Tylenol for pain management - Advise warm compresses and gentle stretching to prevent stiffness - Encourage deep breathing exercises to prevent respiratory complications - Prescribe Robaxin for muscle relaxation, cautioning against activities requiring alertness   Recent kidney function test showed a GFR of 62, down from 85 in May 2024, possibly due to dehydration. NSAIDs are contraindicated due to potential nephrotoxicity. Discussed importance of maintaining hydration. - Advise against the use of NSAIDs - Recommend maintaining hydration  Pruritic Rash Reports itchy rash on both arms, with a similar presentation in their brother who was diagnosed with a form of psoriasis. Rash is not painful and there is no history of insect bites. Discussed using  hydrocortisone cream to alleviate itching. - Recommend application of hydrocortisone cream to alleviate itching  Follow-up - Follow up with primary care provider if symptoms persist or worsen - Monitor kidney function in future lab tests.        No follow-ups on file.   I, Eeva Schlosser E Galvin Aversa, PA-C, have reviewed all documentation for this visit. The documentation on 10/21/23 for the exam, diagnosis, procedures, and orders are all accurate and complete.   Jacquelin Hawking, MHS, PA-C [facility]

## 2023-10-21 NOTE — Patient Instructions (Signed)
VISIT SUMMARY:  During today's visit, we discussed your recent chest pain following a fall caused by your pet dog. We also addressed your itchy rash and reviewed your kidney function. We have created a plan to manage your pain and other symptoms while considering your medical history and preferences.  YOUR PLAN:  -CHEST PAIN SECONDARY TO RIB CONTUSION: You have chest pain likely due to a bruised rib from your recent fall. This type of injury can cause significant pain, especially when breathing deeply or coughing.  For pain management, we recommend continuing with Tylenol and adding Robaxin for muscle relaxation. Please do not take the Flexeril if using the Robaxin.  Avoid NSAIDs due to your kidney function. Use warm compresses and gentle stretching to prevent stiffness, and practice deep breathing exercises to avoid respiratory issues.  -CHRONIC KIDNEY DISEASE (CKD): Your recent kidney function test showed a decrease in your kidney's ability to filter waste, which may be due to dehydration. To protect your kidneys, avoid NSAIDs and ensure you stay well-hydrated.  -PRURITIC RASH: You have an itchy rash on your arms, which may be a form of psoriasis. Psoriasis is a skin condition that causes red, itchy patches. We recommend using hydrocortisone cream to relieve the itching.  INSTRUCTIONS:  Please follow up with your primary care provider if your symptoms persist or worsen. Additionally, we will monitor your kidney function in future lab tests.

## 2023-10-21 NOTE — Telephone Encounter (Signed)
Requested medication (s) are due for refill today: routing for review  Requested medication (s) are on the active medication list: yes  Last refill:  10/04/23  Future visit scheduled: yes  Notes to clinic:  Unable to refill per protocol, cannot delegate.      Requested Prescriptions  Pending Prescriptions Disp Refills   QUEtiapine (SEROQUEL) 50 MG tablet [Pharmacy Med Name: QUETIAPINE FUMARATE 50 MG TAB] 30 tablet 0    Sig: TAKE 1 TABLET BY MOUTH EVERYDAY AT BEDTIME     Not Delegated - Psychiatry:  Antipsychotics - Second Generation (Atypical) - quetiapine Failed - 10/21/2023  3:18 PM      Failed - This refill cannot be delegated      Failed - Lipid Panel in normal range within the last 12 months    Cholesterol, Total  Date Value Ref Range Status  08/30/2023 174 100 - 199 mg/dL Final   Cholesterol Piccolo, Waived  Date Value Ref Range Status  11/12/2016 173 <200 mg/dL Final    Comment:                            Desirable                <200                         Borderline High      200- 239                         High                     >239    LDL Chol Calc (NIH)  Date Value Ref Range Status  08/30/2023 93 0 - 99 mg/dL Final   HDL  Date Value Ref Range Status  08/30/2023 57 >39 mg/dL Final   Triglycerides  Date Value Ref Range Status  08/30/2023 135 0 - 149 mg/dL Final   Triglycerides Piccolo,Waived  Date Value Ref Range Status  11/12/2016 210 (H) <150 mg/dL Final    Comment:                            Normal                   <150                         Borderline High     150 - 199                         High                200 - 499                         Very High                >499          Passed - TSH in normal range and within 360 days    TSH  Date Value Ref Range Status  08/30/2023 1.140 0.450 - 4.500 uIU/mL Final         Passed - Completed PHQ-2 or PHQ-9 in the last 360 days  Passed - Last BP in normal range    BP Readings  from Last 1 Encounters:  08/30/23 100/67         Passed - Last Heart Rate in normal range    Pulse Readings from Last 1 Encounters:  08/30/23 93         Passed - Valid encounter within last 6 months    Recent Outpatient Visits           1 month ago Routine general medical examination at a health care facility   [facility], Megan P, DO   6 months ago Anxiety   [place] [facility] [place], [place] P, DO   7 months ago Chronic right shoulder pain   [place] [facility] [place], Megan P, DO   8 months ago Fatigue due to depression   [place] [facility] Pearley, Sherran Needs, NP   1 year ago Anxiety   [place] [facility] [place], [place], DO       Future Appointments             Today Mecum, Oswaldo Conroy, PA-C [place] [facility]   In 4 months Laural Benes, Oralia Rud, DO  [facility], [facility]            Passed - CBC within normal limits and completed in the last 12 months    WBC  Date Value Ref Range Status  08/30/2023 6.1 3.4 - 10.8 x10E3/uL Final  07/18/2020 4.6 4.0 - 10.5 K/uL Final   RBC  Date Value Ref Range Status  08/30/2023 3.54 (L) 3.77 - 5.28 x10E6/uL Final  07/18/2020 3.40 (L) 3.87 - 5.11 MIL/uL Final   Hemoglobin  Date Value Ref Range Status  08/30/2023 11.4 11.1 - 15.9 g/dL Final   Hematocrit  Date Value Ref Range Status  08/30/2023 35.8 34.0 - 46.6 % Final   MCHC  Date Value Ref Range Status  08/30/2023 31.8 31.5 - 35.7 g/dL Final  16/06/9603 54.0 30.0 - 36.0 g/dL Final   [facility]  Date Value Ref Range Status  08/30/2023 32.2 26.6 - 33.0 pg Final  07/18/2020 32.9 26.0 - 34.0 pg Final   MCV  Date Value Ref Range Status  08/30/2023 101 (H) 79 - 97 fL Final   No results found for: "PLTCOUNTKUC", "LABPLAT", "POCPLA" RDW  Date Value Ref Range Status  08/30/2023 11.8 11.7 - 15.4 % Final         Passed - CMP  within normal limits and completed in the last 12 months    Albumin  Date Value Ref Range Status  08/30/2023 3.9 3.8 - 4.8 g/dL Final   Alkaline Phosphatase  Date Value Ref Range Status  08/30/2023 103 44 - 121 IU/L Final   ALT  Date Value Ref Range Status  08/30/2023 9 0 - 32 IU/L Final   ALT (SGPT) Piccolo, Waived  Date Value Ref Range Status  11/12/2016 15 10 - 47 U/L Final   AST  Date Value Ref Range Status  08/30/2023 15 0 - 40 IU/L Final   AST (SGOT) Piccolo, Waived  Date Value Ref Range Status  11/12/2016 19 11 - 38 U/L Final   BUN  Date Value Ref Range Status  08/30/2023 16 8 - 27 mg/dL Final   Calcium  Date Value Ref Range Status  08/30/2023 9.2 8.7 - 10.3 mg/dL Final   CO2  Date Value Ref Range  Status  08/30/2023 23 20 - 29 mmol/L Final   Bicarbonate  Date Value Ref Range Status  04/07/2018 20.1 20.0 - 28.0 mmol/L Corrected   Creatinine, Ser  Date Value Ref Range Status  08/30/2023 0.97 0.57 - 1.00 mg/dL Final   Glucose  Date Value Ref Range Status  08/30/2023 87 70 - 99 mg/dL Final   Glucose, Bld  Date Value Ref Range Status  07/18/2020 81 70 - 99 mg/dL Final    Comment:    Glucose reference range applies only to samples taken after fasting for at least 8 hours.   Glucose-Capillary  Date Value Ref Range Status  04/14/2018 84 70 - 99 mg/dL Final   Potassium  Date Value Ref Range Status  08/30/2023 4.3 3.5 - 5.2 mmol/L Final   Sodium  Date Value Ref Range Status  08/30/2023 139 134 - 144 mmol/L Final   Bilirubin Total  Date Value Ref Range Status  08/30/2023 <0.2 0.0 - 1.2 mg/dL Final   Bilirubin, Direct  Date Value Ref Range Status  04/05/2018 0.2 0.0 - 0.2 mg/dL Final    Comment:    Please note change in reference range.   Indirect Bilirubin  Date Value Ref Range Status  04/05/2018 0.4 0.3 - 0.9 mg/dL Final    Comment:    Performed at [facility], [address] [place], [place] [address]   Protein, ur  Date  Value Ref Range Status  04/05/2018 30 (A) NEGATIVE mg/dL Final   Protein,UA  Date Value Ref Range Status  07/30/2022 1+ (A) Negative/Trace Final   Total Protein  Date Value Ref Range Status  08/30/2023 6.6 6.0 - 8.5 g/dL Final   GFR calc Af Amer  Date Value Ref Range Status  11/24/2019 57 (L) >59 mL/min/1.73 Final   eGFR  Date Value Ref Range Status  08/30/2023 62 >59 mL/min/1.73 Final   GFR, Estimated  Date Value Ref Range Status  07/18/2020 >60 >60 mL/min Final    Comment:    (NOTE) Calculated using the CKD-EPI Creatinine Equation (2021)           cyclobenzaprine (FLEXERIL) 5 MG tablet [Pharmacy Med Name: CYCLOBENZAPRINE 5 MG TABLET] 30 tablet 0    Sig: TAKE 1 TABLET BY MOUTH EVERYDAY AT BEDTIME     Not Delegated - Analgesics:  Muscle Relaxants Failed - 10/21/2023  3:18 PM      Failed - This refill cannot be delegated      Passed - Valid encounter within last 6 months    Recent Outpatient Visits           1 month ago Routine general medical examination at a health care facility   [facility] [place], Megan P, DO   6 months ago Anxiety   [place] [facility] [place], [place] P, DO   7 months ago Chronic right shoulder pain   [place] [facility] [place], Megan P, DO   8 months ago Fatigue due to depression   [place] [facility] Pearley, Sherran Needs, NP   1 year ago Anxiety   [place] [facility] [place], [place], DO       Future Appointments             Today Mecum, Oswaldo Conroy, PA-C  [facility], [facility]   In 4 months Laural Benes, Oralia Rud, DO  [facility], [facility]

## 2023-11-02 ENCOUNTER — Other Ambulatory Visit: Payer: Self-pay | Admitting: Family Medicine

## 2023-11-02 DIAGNOSIS — M25572 Pain in left ankle and joints of left foot: Secondary | ICD-10-CM

## 2023-11-03 NOTE — Telephone Encounter (Signed)
 Requested medication (s) are due for refill today - no  Requested medication (s) are on the active medication list -no  Future visit scheduled -yes  Last refill: unknown  Notes to clinic: non delegated Rx  Requested Prescriptions  Pending Prescriptions Disp Refills   cyclobenzaprine  (FLEXERIL ) 5 MG tablet [Pharmacy Med Name: CYCLOBENZAPRINE  5 MG TABLET] 30 tablet 0    Sig: TAKE 1 TABLET BY MOUTH EVERYDAY AT BEDTIME     Not Delegated - Analgesics:  Muscle Relaxants Failed - 11/03/2023  8:48 AM      Failed - This refill cannot be delegated      Passed - Valid encounter within last 6 months    Recent Outpatient Visits           1 week ago Rib pain on right side   [place] [facility] Mecum, Rocky BRAVO, PA-C   2 months ago Routine general medical examination at a health care facility   [facility], [facility], DO   7 months ago Anxiety   [place] [facility] [place], [place] P, DO   8 months ago Chronic right shoulder pain   [facility] [place], Megan P, DO   9 months ago Fatigue due to depression   [place] [facility] Pearley, Hyla Givens, NP       Future Appointments             In 3 months Vicci, Duwaine SQUIBB, DO [place] [facility]               Requested Prescriptions  Pending Prescriptions Disp Refills   cyclobenzaprine  (FLEXERIL ) 5 MG tablet [Pharmacy Med Name: CYCLOBENZAPRINE  5 MG TABLET] 30 tablet 0    Sig: TAKE 1 TABLET BY MOUTH EVERYDAY AT BEDTIME     Not Delegated - Analgesics:  Muscle Relaxants Failed - 11/03/2023  8:48 AM      Failed - This refill cannot be delegated      Passed - Valid encounter within last 6 months    Recent Outpatient Visits           1 week ago Rib pain on right side   [facility] Mecum, Rocky BRAVO, PA-C   2 months ago Routine general medical examination at a health care facility    [facility] [place], Megan P, DO   7 months ago Anxiety   [place] [facility] [place], [place] P, DO   8 months ago Chronic right shoulder pain   [place] [facility] [place], Megan P, DO   9 months ago Fatigue due to depression   [place] [facility] Thadde[place], Hyla Givens, NP       Future Appointments             In 3 months Vicci, Duwaine SQUIBB, DO [facility], [facility]

## 2023-11-12 ENCOUNTER — Other Ambulatory Visit: Payer: Self-pay | Admitting: Family Medicine

## 2023-11-15 NOTE — Telephone Encounter (Signed)
Only Zofran is due.

## 2023-11-15 NOTE — Telephone Encounter (Signed)
Requested medications are due for refill today.  Zofran is.  Requested medications are on the active medications list.  yes  Last refill. Zofran 10/19/2023 #30 0 rf, Seroquel both refilled 08/30/2023 #90 1 rf  Future visit scheduled.   yes  Notes to clinic.  Refill/refusal not delegated.    Requested Prescriptions  Pending Prescriptions Disp Refills   QUEtiapine (SEROQUEL) 50 MG tablet [Pharmacy Med Name: QUETIAPINE FUMARATE 50 MG TAB] 30 tablet 0    Sig: TAKE 1 TABLET BY MOUTH EVERYDAY AT BEDTIME     Not Delegated - Psychiatry:  Antipsychotics - Second Generation (Atypical) - quetiapine Failed - 11/15/2023  7:39 AM      Failed - This refill cannot be delegated      Failed - Last Heart Rate in normal range    Pulse Readings from Last 1 Encounters:  10/21/23 (!) 120         Failed - Lipid Panel in normal range within the last 12 months    Cholesterol, Total  Date Value Ref Range Status  08/30/2023 174 100 - 199 mg/dL Final   Cholesterol Piccolo, Waived  Date Value Ref Range Status  11/12/2016 173 <200 mg/dL Final    Comment:                            Desirable                <200                         Borderline High      200- 239                         High                     >239    LDL Chol Calc (NIH)  Date Value Ref Range Status  08/30/2023 93 0 - 99 mg/dL Final   HDL  Date Value Ref Range Status  08/30/2023 57 >39 mg/dL Final   Triglycerides  Date Value Ref Range Status  08/30/2023 135 0 - 149 mg/dL Final   Triglycerides Piccolo,Waived  Date Value Ref Range Status  11/12/2016 210 (H) <150 mg/dL Final    Comment:                            Normal                   <150                         Borderline High     150 - 199                         High                200 - 499                         Very High                >499          Passed - TSH in normal range and within 360 days    TSH  Date Value Ref  Range Status  08/30/2023 1.140 0.450 -  4.500 uIU/mL Final         Passed - Completed PHQ-2 or PHQ-9 in the last 360 days      Passed - Last BP in normal range    BP Readings from Last 1 Encounters:  10/21/23 120/84         Passed - Valid encounter within last 6 months    Recent Outpatient Visits           3 weeks ago Rib pain on right side   [facility] Mecum, Oswaldo Conroy, PA-C   2 months ago Routine general medical examination at a health care facility   [facility], [facility], DO   7 months ago Anxiety   [facility] [facility] [place], [place] P, DO   8 months ago Chronic right shoulder pain   [place] [facility] [place], Megan P, DO   9 months ago Fatigue due to depression   [place] [facility] [place], Sherran Needs, NP       Future Appointments             In 3 months Laural Benes, Oralia Rud, DO [facility]            Passed - CBC within normal limits and completed in the last 12 months    WBC  Date Value Ref Range Status  08/30/2023 6.1 3.4 - 10.8 x10E3/uL Final  07/18/2020 4.6 4.0 - 10.5 K/uL Final   RBC  Date Value Ref Range Status  08/30/2023 3.54 (L) 3.77 - 5.28 x10E6/uL Final  07/18/2020 3.40 (L) 3.87 - 5.11 MIL/uL Final   Hemoglobin  Date Value Ref Range Status  08/30/2023 11.4 11.1 - 15.9 g/dL Final   Hematocrit  Date Value Ref Range Status  08/30/2023 35.8 34.0 - 46.6 % Final   MCHC  Date Value Ref Range Status  08/30/2023 31.8 31.5 - 35.7 g/dL Final  16/06/9603 54.0 30.0 - 36.0 g/dL Final   [facility]  Date Value Ref Range Status  08/30/2023 32.2 26.6 - 33.0 pg Final  07/18/2020 32.9 26.0 - 34.0 pg Final   MCV  Date Value Ref Range Status  08/30/2023 101 (H) 79 - 97 fL Final   No results found for: "PLTCOUNTKUC", "LABPLAT", "POCPLA" RDW  Date Value Ref Range Status  08/30/2023 11.8 11.7 - 15.4 % Final         Passed - CMP within normal limits and  completed in the last 12 months    Albumin  Date Value Ref Range Status  08/30/2023 3.9 3.8 - 4.8 g/dL Final   Alkaline Phosphatase  Date Value Ref Range Status  08/30/2023 103 44 - 121 IU/L Final   ALT  Date Value Ref Range Status  08/30/2023 9 0 - 32 IU/L Final   ALT (SGPT) Piccolo, Waived  Date Value Ref Range Status  11/12/2016 15 10 - 47 U/L Final   AST  Date Value Ref Range Status  08/30/2023 15 0 - 40 IU/L Final   AST (SGOT) Piccolo, Waived  Date Value Ref Range Status  11/12/2016 19 11 - 38 U/L Final   BUN  Date Value Ref Range Status  08/30/2023 16 8 - 27 mg/dL Final   Calcium  Date Value Ref Range Status  08/30/2023 9.2 8.7 - 10.3 mg/dL Final   CO2  Date Value Ref Range Status  08/30/2023  23 20 - 29 mmol/L Final   Bicarbonate  Date Value Ref Range Status  04/07/2018 20.1 20.0 - 28.0 mmol/L Corrected   Creatinine, Ser  Date Value Ref Range Status  08/30/2023 0.97 0.57 - 1.00 mg/dL Final   Glucose  Date Value Ref Range Status  08/30/2023 87 70 - 99 mg/dL Final   Glucose, Bld  Date Value Ref Range Status  07/18/2020 81 70 - 99 mg/dL Final    Comment:    Glucose reference range applies only to samples taken after fasting for at least 8 hours.   Glucose-Capillary  Date Value Ref Range Status  04/14/2018 84 70 - 99 mg/dL Final   Potassium  Date Value Ref Range Status  08/30/2023 4.3 3.5 - 5.2 mmol/L Final   Sodium  Date Value Ref Range Status  08/30/2023 139 134 - 144 mmol/L Final   Bilirubin Total  Date Value Ref Range Status  08/30/2023 <0.2 0.0 - 1.2 mg/dL Final   Bilirubin, Direct  Date Value Ref Range Status  04/05/2018 0.2 0.0 - 0.2 mg/dL Final    Comment:    Please note change in reference range.   Indirect Bilirubin  Date Value Ref Range Status  04/05/2018 0.4 0.3 - 0.9 mg/dL Final    Comment:    Performed at [facility], [address] [place], [place] [address]   Protein, ur  Date Value Ref Range Status   04/05/2018 30 (A) NEGATIVE mg/dL Final   Protein,UA  Date Value Ref Range Status  07/30/2022 1+ (A) Negative/Trace Final   Total Protein  Date Value Ref Range Status  08/30/2023 6.6 6.0 - 8.5 g/dL Final   GFR calc Af Amer  Date Value Ref Range Status  11/24/2019 57 (L) >59 mL/min/1.73 Final   eGFR  Date Value Ref Range Status  08/30/2023 62 >59 mL/min/1.73 Final   GFR, Estimated  Date Value Ref Range Status  07/18/2020 >60 >60 mL/min Final    Comment:    (NOTE) Calculated using the CKD-EPI Creatinine Equation (2021)           ondansetron (ZOFRAN) 4 MG tablet [Pharmacy Med Name: ONDANSETRON HCL 4 MG TABLET] 30 tablet 0    Sig: DISSOLVE 1 TABLET IN MOUTH AS NEEDED FOR NAUSEA UP TO 2 TIMES A DAY     Not Delegated - Gastroenterology: Antiemetics - ondansetron Failed - 11/15/2023  7:39 AM      Failed - This refill cannot be delegated      Passed - AST in normal range and within 360 days    AST  Date Value Ref Range Status  08/30/2023 15 0 - 40 IU/L Final   AST (SGOT) Piccolo, Waived  Date Value Ref Range Status  11/12/2016 19 11 - 38 U/L Final         Passed - ALT in normal range and within 360 days    ALT  Date Value Ref Range Status  08/30/2023 9 0 - 32 IU/L Final   ALT (SGPT) Piccolo, Waived  Date Value Ref Range Status  11/12/2016 15 10 - 47 U/L Final         Passed - Valid encounter within last 6 months    Recent Outpatient Visits           3 weeks ago Rib pain on right side   [place] [facility] Mecum, Oswaldo Conroy, PA-C   2 months ago Routine general medical examination at a health care facility  [place] [facility] [place], [place] P, DO   7 months ago Anxiety   [place] [facility] [place], [place] P, DO   8 months ago Chronic right shoulder pain   [facility] [place], Megan P, DO   9 months ago Fatigue due to depression   [facility] [place] Cooks, NP       Future Appointments             In 3 months Johnson, Oralia Rud, DO [place]             QUEtiapine Fumarate (SEROQUEL XR) 150 MG 24 hr tablet [Pharmacy Med Name: QUETIAPINE ER 150 MG TABLET] 90 tablet 1    Sig: TAKE 1 TABLET BY MOUTH AT BEDTIME.     Not Delegated - Psychiatry:  Antipsychotics - Second Generation (Atypical) - quetiapine Failed - 11/15/2023  7:39 AM      Failed - This refill cannot be delegated      Failed - Last Heart Rate in normal range    Pulse Readings from Last 1 Encounters:  10/21/23 (!) 120         Failed - Lipid Panel in normal range within the last 12 months    Cholesterol, Total  Date Value Ref Range Status  08/30/2023 174 100 - 199 mg/dL Final   Cholesterol Piccolo, Waived  Date Value Ref Range Status  11/12/2016 173 <200 mg/dL Final    Comment:                            Desirable                <200                         Borderline High      200- 239                         High                     >239    LDL Chol Calc (NIH)  Date Value Ref Range Status  08/30/2023 93 0 - 99 mg/dL Final   HDL  Date Value Ref Range Status  08/30/2023 57 >39 mg/dL Final   Triglycerides  Date Value Ref Range Status  08/30/2023 135 0 - 149 mg/dL Final   Triglycerides Piccolo,Waived  Date Value Ref Range Status  11/12/2016 210 (H) <150 mg/dL Final    Comment:                            Normal                   <150                         Borderline High     150 - 199                         High                200 - 499  Very High                >499          Passed - TSH in normal range and within 360 days    TSH  Date Value Ref Range Status  08/30/2023 1.140 0.450 - 4.500 uIU/mL Final         Passed - Completed PHQ-2 or PHQ-9 in the last 360 days      Passed - Last BP in normal range    BP Readings from Last 1 Encounters:  10/21/23 120/84         Passed -  Valid encounter within last 6 months    Recent Outpatient Visits           3 weeks ago Rib pain on right side   [place] [facility] Mecum, Oswaldo Conroy, PA-C   2 months ago Routine general medical examination at a health care facility   [facility], [facility], DO   7 months ago Anxiety   [facility] [place], [place] P, DO   8 months ago Chronic right shoulder pain   [facility] [place], Megan P, DO   9 months ago Fatigue due to depression   [facility] [place], Sherran Needs, NP       Future Appointments             In 3 months Laural Benes, Oralia Rud, DO [facility]            Passed - CBC within normal limits and completed in the last 12 months    WBC  Date Value Ref Range Status  08/30/2023 6.1 3.4 - 10.8 x10E3/uL Final  07/18/2020 4.6 4.0 - 10.5 K/uL Final   RBC  Date Value Ref Range Status  08/30/2023 3.54 (L) 3.77 - 5.28 x10E6/uL Final  07/18/2020 3.40 (L) 3.87 - 5.11 MIL/uL Final   Hemoglobin  Date Value Ref Range Status  08/30/2023 11.4 11.1 - 15.9 g/dL Final   Hematocrit  Date Value Ref Range Status  08/30/2023 35.8 34.0 - 46.6 % Final   MCHC  Date Value Ref Range Status  08/30/2023 31.8 31.5 - 35.7 g/dL Final  30/16/0109 32.3 30.0 - 36.0 g/dL Final   [facility]  Date Value Ref Range Status  08/30/2023 32.2 26.6 - 33.0 pg Final  07/18/2020 32.9 26.0 - 34.0 pg Final   MCV  Date Value Ref Range Status  08/30/2023 101 (H) 79 - 97 fL Final   No results found for: "PLTCOUNTKUC", "LABPLAT", "POCPLA" RDW  Date Value Ref Range Status  08/30/2023 11.8 11.7 - 15.4 % Final         Passed - CMP within normal limits and completed in the last 12 months    Albumin  Date Value Ref Range Status  08/30/2023 3.9 3.8 - 4.8 g/dL Final   Alkaline Phosphatase  Date Value Ref Range Status  08/30/2023 103 44 - 121 IU/L Final    ALT  Date Value Ref Range Status  08/30/2023 9 0 - 32 IU/L Final   ALT (SGPT) Piccolo, Waived  Date Value Ref Range Status  11/12/2016 15 10 - 47 U/L Final   AST  Date Value Ref Range Status  08/30/2023 15 0 - 40 IU/L Final   AST (SGOT) Piccolo, Waived  Date Value Ref Range Status  11/12/2016 19 11 - 38 U/L Final  BUN  Date Value Ref Range Status  08/30/2023 16 8 - 27 mg/dL Final   Calcium  Date Value Ref Range Status  08/30/2023 9.2 8.7 - 10.3 mg/dL Final   CO2  Date Value Ref Range Status  08/30/2023 23 20 - 29 mmol/L Final   Bicarbonate  Date Value Ref Range Status  04/07/2018 20.1 20.0 - 28.0 mmol/L Corrected   Creatinine, Ser  Date Value Ref Range Status  08/30/2023 0.97 0.57 - 1.00 mg/dL Final   Glucose  Date Value Ref Range Status  08/30/2023 87 70 - 99 mg/dL Final   Glucose, Bld  Date Value Ref Range Status  07/18/2020 81 70 - 99 mg/dL Final    Comment:    Glucose reference range applies only to samples taken after fasting for at least 8 hours.   Glucose-Capillary  Date Value Ref Range Status  04/14/2018 84 70 - 99 mg/dL Final   Potassium  Date Value Ref Range Status  08/30/2023 4.3 3.5 - 5.2 mmol/L Final   Sodium  Date Value Ref Range Status  08/30/2023 139 134 - 144 mmol/L Final   Bilirubin Total  Date Value Ref Range Status  08/30/2023 <0.2 0.0 - 1.2 mg/dL Final   Bilirubin, Direct  Date Value Ref Range Status  04/05/2018 0.2 0.0 - 0.2 mg/dL Final    Comment:    Please note change in reference range.   Indirect Bilirubin  Date Value Ref Range Status  04/05/2018 0.4 0.3 - 0.9 mg/dL Final    Comment:    Performed at [facility], [address] [place], [place] [address]   Protein, ur  Date Value Ref Range Status  04/05/2018 30 (A) NEGATIVE mg/dL Final   Protein,UA  Date Value Ref Range Status  07/30/2022 1+ (A) Negative/Trace Final   Total Protein  Date Value Ref Range Status  08/30/2023 6.6 6.0 - 8.5 g/dL  Final   GFR calc Af Amer  Date Value Ref Range Status  11/24/2019 57 (L) >59 mL/min/1.73 Final   eGFR  Date Value Ref Range Status  08/30/2023 62 >59 mL/min/1.73 Final   GFR, Estimated  Date Value Ref Range Status  07/18/2020 >60 >60 mL/min Final    Comment:    (NOTE) Calculated using the CKD-EPI Creatinine Equation (2021)

## 2023-11-21 ENCOUNTER — Other Ambulatory Visit: Payer: Self-pay | Admitting: Family Medicine

## 2023-11-23 NOTE — Telephone Encounter (Signed)
 Requested Prescriptions  Pending Prescriptions Disp Refills   albuterol (VENTOLIN HFA) 108 (90 Base) MCG/ACT inhaler [Pharmacy Med Name: ALBUTEROL HFA (VENTOLIN) INH] 36 each 2    Sig: TAKE 2 PUFFS BY MOUTH EVERY 6 HOURS AS NEEDED FOR WHEEZE OR SHORTNESS OF BREATH     Pulmonology:  Beta Agonists 2 Failed - 11/23/2023  9:10 AM      Failed - Last Heart Rate in normal range    Pulse Readings from Last 1 Encounters:  10/21/23 (!) 120         Passed - Last BP in normal range    BP Readings from Last 1 Encounters:  10/21/23 120/84         Passed - Valid encounter within last 12 months    Recent Outpatient Visits           1 month ago Rib pain on right side   [facility]um, Oswaldo Conroy, PA-C   2 months ago Routine general medical examination at a health care facility   [facility] [place], [place] P, DO   8 months ago Anxiety   [facility] [place], [place] P, DO   8 months ago Chronic right shoulder pain   [place] [facility] [place], Megan P, DO   9 months ago Fatigue due to depression   [place] [facility] Rushie Goltz, Sherran Needs, NP       Future Appointments             In 3 months Laural Benes, Oralia Rud, DO [place] [facility]

## 2023-12-06 ENCOUNTER — Other Ambulatory Visit: Payer: Self-pay | Admitting: Family Medicine

## 2023-12-06 DIAGNOSIS — M25572 Pain in left ankle and joints of left foot: Secondary | ICD-10-CM

## 2023-12-07 NOTE — Telephone Encounter (Signed)
 Requested medication (s) are due for refill today: Yes  Requested medication (s) are on the active medication list: Yes  Last refill:  11/03/23  Future visit scheduled: Yes  Notes to clinic:  Unable to refill per protocol, cannot delegate.      Requested Prescriptions  Pending Prescriptions Disp Refills   cyclobenzaprine (FLEXERIL) 5 MG tablet [Pharmacy Med Name: CYCLOBENZAPRINE 5 MG TABLET] 30 tablet 0    Sig: TAKE 1 TABLET BY MOUTH EVERYDAY AT BEDTIME     Not Delegated - Analgesics:  Muscle Relaxants Failed - 12/07/2023  4:13 PM      Failed - This refill cannot be delegated      Passed - Valid encounter within last 6 months    Recent Outpatient Visits           1 month ago Rib pain on right side   [place] [facility] Mecum, Oswaldo Conroy, PA-C   3 months ago Routine general medical examination at a health care facility   [facility], DO   8 months ago Anxiety   [place] [facility] [place], [place] P, DO   9 months ago Chronic right shoulder pain   [facility] [place], Megan P, DO   10 months ago Fatigue due to depression   [place] [facility] Rushie Goltz, Sherran Needs, NP       Future Appointments             In 2 months Laural Benes, Oralia Rud, DO  [facility]

## 2023-12-13 ENCOUNTER — Other Ambulatory Visit: Payer: Self-pay | Admitting: Family Medicine

## 2023-12-14 NOTE — Telephone Encounter (Signed)
 Refill too soon.  Requested Prescriptions  Pending Prescriptions Disp Refills   QUEtiapine Fumarate (SEROQUEL XR) 150 MG 24 hr tablet [Pharmacy Med Name: QUETIAPINE ER 150 MG TABLET] 90 tablet 1    Sig: TAKE 1 TABLET BY MOUTH AT BEDTIME.     Not Delegated - Psychiatry:  Antipsychotics - Second Generation (Atypical) - quetiapine Failed - 12/14/2023  1:56 PM      Failed - This refill cannot be delegated      Failed - Last Heart Rate in normal range    Pulse Readings from Last 1 Encounters:  10/21/23 (!) 120         Failed - Lipid Panel in normal range within the last 12 months    Cholesterol, Total  Date Value Ref Range Status  08/30/2023 174 100 - 199 mg/dL Final   Cholesterol Piccolo, Waived  Date Value Ref Range Status  11/12/2016 173 <200 mg/dL Final    Comment:                            Desirable                <200                         Borderline High      200- 239                         High                     >239    LDL Chol Calc (NIH)  Date Value Ref Range Status  08/30/2023 93 0 - 99 mg/dL Final   HDL  Date Value Ref Range Status  08/30/2023 57 >39 mg/dL Final   Triglycerides  Date Value Ref Range Status  08/30/2023 135 0 - 149 mg/dL Final   Triglycerides Piccolo,Waived  Date Value Ref Range Status  11/12/2016 210 (H) <150 mg/dL Final    Comment:                            Normal                   <150                         Borderline High     150 - 199                         High                200 - 499                         Very High                >499          Passed - TSH in normal range and within 360 days    TSH  Date Value Ref Range Status  08/30/2023 1.140 0.450 - 4.500 uIU/mL Final         Passed - Completed PHQ-2 or PHQ-9 in the last 360 days      Passed - Last BP in normal range    BP Readings from  Last 1 Encounters:  10/21/23 120/84         Passed - Valid encounter within last 6 months    Recent Outpatient Visits            1 month ago Rib pain on right side   [place] [facility] Mecum, Oswaldo Conroy, PA-C   3 months ago Routine general medical examination at a health care facility   [facility], Megan P, DO   8 months ago Anxiety   [place] [facility] [place], Megan P, DO   9 months ago Chronic right shoulder pain   [place] [facility] [place], Megan P, DO   10 months ago Fatigue due to depression   [facility] [facility] [place], Sherran Needs, NP       Future Appointments             In 2 months Laural Benes, Oralia Rud, DO [place] [facility]            Passed - CBC within normal limits and completed in the last 12 months    WBC  Date Value Ref Range Status  08/30/2023 6.1 3.4 - 10.8 x10E3/uL Final  07/18/2020 4.6 4.0 - 10.5 K/uL Final   RBC  Date Value Ref Range Status  08/30/2023 3.54 (L) 3.77 - 5.28 x10E6/uL Final  07/18/2020 3.40 (L) 3.87 - 5.11 MIL/uL Final   Hemoglobin  Date Value Ref Range Status  08/30/2023 11.4 11.1 - 15.9 g/dL Final   Hematocrit  Date Value Ref Range Status  08/30/2023 35.8 34.0 - 46.6 % Final   MCHC  Date Value Ref Range Status  08/30/2023 31.8 31.5 - 35.7 g/dL Final  46/96/2952 84.1 30.0 - 36.0 g/dL Final   [facility]  Date Value Ref Range Status  08/30/2023 32.2 26.6 - 33.0 pg Final  07/18/2020 32.9 26.0 - 34.0 pg Final   MCV  Date Value Ref Range Status  08/30/2023 101 (H) 79 - 97 fL Final   No results found for: "PLTCOUNTKUC", "LABPLAT", "POCPLA" RDW  Date Value Ref Range Status  08/30/2023 11.8 11.7 - 15.4 % Final         Passed - CMP within normal limits and completed in the last 12 months    Albumin  Date Value Ref Range Status  08/30/2023 3.9 3.8 - 4.8 g/dL Final   Alkaline Phosphatase  Date Value Ref Range Status  08/30/2023 103 44 - 121 IU/L Final   ALT  Date Value Ref Range Status  08/30/2023 9 0 - 32 IU/L  Final   ALT (SGPT) Piccolo, Waived  Date Value Ref Range Status  11/12/2016 15 10 - 47 U/L Final   AST  Date Value Ref Range Status  08/30/2023 15 0 - 40 IU/L Final   AST (SGOT) Piccolo, Waived  Date Value Ref Range Status  11/12/2016 19 11 - 38 U/L Final   BUN  Date Value Ref Range Status  08/30/2023 16 8 - 27 mg/dL Final   Calcium  Date Value Ref Range Status  08/30/2023 9.2 8.7 - 10.3 mg/dL Final   CO2  Date Value Ref Range Status  08/30/2023 23 20 - 29 mmol/L Final   Bicarbonate  Date Value Ref Range Status  04/07/2018 20.1 20.0 - 28.0 mmol/L Corrected   Creatinine, Ser  Date Value Ref Range Status  08/30/2023 0.97 0.57 - 1.00 mg/dL Final   Glucose  Date Value  Ref Range Status  08/30/2023 87 70 - 99 mg/dL Final   Glucose, Bld  Date Value Ref Range Status  07/18/2020 81 70 - 99 mg/dL Final    Comment:    Glucose reference range applies only to samples taken after fasting for at least 8 hours.   Glucose-Capillary  Date Value Ref Range Status  04/14/2018 84 70 - 99 mg/dL Final   Potassium  Date Value Ref Range Status  08/30/2023 4.3 3.5 - 5.2 mmol/L Final   Sodium  Date Value Ref Range Status  08/30/2023 139 134 - 144 mmol/L Final   Bilirubin Total  Date Value Ref Range Status  08/30/2023 <0.2 0.0 - 1.2 mg/dL Final   Bilirubin, Direct  Date Value Ref Range Status  04/05/2018 0.2 0.0 - 0.2 mg/dL Final    Comment:    Please note change in reference range.   Indirect Bilirubin  Date Value Ref Range Status  04/05/2018 0.4 0.3 - 0.9 mg/dL Final    Comment:    Performed at [facility], [address] [place], [place] [address]   Protein, ur  Date Value Ref Range Status  04/05/2018 30 (A) NEGATIVE mg/dL Final   Protein,UA  Date Value Ref Range Status  07/30/2022 1+ (A) Negative/Trace Final   Total Protein  Date Value Ref Range Status  08/30/2023 6.6 6.0 - 8.5 g/dL Final   GFR calc Af Amer  Date Value Ref Range Status   11/24/2019 57 (L) >59 mL/min/1.73 Final   eGFR  Date Value Ref Range Status  08/30/2023 62 >59 mL/min/1.73 Final   GFR, Estimated  Date Value Ref Range Status  07/18/2020 >60 >60 mL/min Final    Comment:    (NOTE) Calculated using the CKD-EPI Creatinine Equation (2021)           QUEtiapine (SEROQUEL) 50 MG tablet [Pharmacy Med Name: QUETIAPINE FUMARATE 50 MG TAB] 30 tablet 0    Sig: TAKE 1 TABLET BY MOUTH EVERYDAY AT BEDTIME     Not Delegated - Psychiatry:  Antipsychotics - Second Generation (Atypical) - quetiapine Failed - 12/14/2023  1:56 PM      Failed - This refill cannot be delegated      Failed - Last Heart Rate in normal range    Pulse Readings from Last 1 Encounters:  10/21/23 (!) 120         Failed - Lipid Panel in normal range within the last 12 months    Cholesterol, Total  Date Value Ref Range Status  08/30/2023 174 100 - 199 mg/dL Final   Cholesterol Piccolo, Waived  Date Value Ref Range Status  11/12/2016 173 <200 mg/dL Final    Comment:                            Desirable                <200                         Borderline High      200- 239                         High                     >239    LDL Chol Calc (NIH)  Date Value Ref Range Status  08/30/2023  93 0 - 99 mg/dL Final   HDL  Date Value Ref Range Status  08/30/2023 57 >39 mg/dL Final   Triglycerides  Date Value Ref Range Status  08/30/2023 135 0 - 149 mg/dL Final   Triglycerides Piccolo,Waived  Date Value Ref Range Status  11/12/2016 210 (H) <150 mg/dL Final    Comment:                            Normal                   <150                         Borderline High     150 - 199                         High                200 - 499                         Very High                >499          Passed - TSH in normal range and within 360 days    TSH  Date Value Ref Range Status  08/30/2023 1.140 0.450 - 4.500 uIU/mL Final         Passed - Completed PHQ-2 or PHQ-9 in  the last 360 days      Passed - Last BP in normal range    BP Readings from Last 1 Encounters:  10/21/23 120/84         Passed - Valid encounter within last 6 months    Recent Outpatient Visits           1 month ago Rib pain on right side   [place] [facility] Mecum, Oswaldo Conroy, PA-C   3 months ago Routine general medical examination at a health care facility   [facility] [place], Megan P, DO   8 months ago Anxiety   [place] [facility] [place], [place] P, DO   9 months ago Chronic right shoulder pain   [place] [place], Megan P, DO   10 months ago Fatigue due to depression   [place] [facility] [place], Sherran Needs, NP       Future Appointments             In 2 months Johnson, Megan P, DO [place] [facility]            Passed - CBC within normal limits and completed in the last 12 months    WBC  Date Value Ref Range Status  08/30/2023 6.1 3.4 - 10.8 x10E3/uL Final  07/18/2020 4.6 4.0 - 10.5 K/uL Final   RBC  Date Value Ref Range Status  08/30/2023 3.54 (L) 3.77 - 5.28 x10E6/uL Final  07/18/2020 3.40 (L) 3.87 - 5.11 MIL/uL Final   Hemoglobin  Date Value Ref Range Status  08/30/2023 11.4 11.1 - 15.9 g/dL Final   Hematocrit  Date Value Ref Range Status  08/30/2023 35.8 34.0 - 46.6 % Final   MCHC  Date Value Ref Range Status  08/30/2023 31.8 31.5 - 35.7 g/dL Final  07/18/2020 32.4 30.0 - 36.0 g/dL Final   [facility]  Date Value Ref Range Status  08/30/2023 32.2 26.6 - 33.0 pg Final  07/18/2020 32.9 26.0 - 34.0 pg Final   MCV  Date Value Ref Range Status  08/30/2023 101 (H) 79 - 97 fL Final   No results found for: "PLTCOUNTKUC", "LABPLAT", "POCPLA" RDW  Date Value Ref Range Status  08/30/2023 11.8 11.7 - 15.4 % Final         Passed - CMP within normal limits and completed in the last 12 months    Albumin  Date Value Ref Range  Status  08/30/2023 3.9 3.8 - 4.8 g/dL Final   Alkaline Phosphatase  Date Value Ref Range Status  08/30/2023 103 44 - 121 IU/L Final   ALT  Date Value Ref Range Status  08/30/2023 9 0 - 32 IU/L Final   ALT (SGPT) Piccolo, Waived  Date Value Ref Range Status  11/12/2016 15 10 - 47 U/L Final   AST  Date Value Ref Range Status  08/30/2023 15 0 - 40 IU/L Final   AST (SGOT) Piccolo, Waived  Date Value Ref Range Status  11/12/2016 19 11 - 38 U/L Final   BUN  Date Value Ref Range Status  08/30/2023 16 8 - 27 mg/dL Final   Calcium  Date Value Ref Range Status  08/30/2023 9.2 8.7 - 10.3 mg/dL Final   CO2  Date Value Ref Range Status  08/30/2023 23 20 - 29 mmol/L Final   Bicarbonate  Date Value Ref Range Status  04/07/2018 20.1 20.0 - 28.0 mmol/L Corrected   Creatinine, Ser  Date Value Ref Range Status  08/30/2023 0.97 0.57 - 1.00 mg/dL Final   Glucose  Date Value Ref Range Status  08/30/2023 87 70 - 99 mg/dL Final   Glucose, Bld  Date Value Ref Range Status  07/18/2020 81 70 - 99 mg/dL Final    Comment:    Glucose reference range applies only to samples taken after fasting for at least 8 hours.   Glucose-Capillary  Date Value Ref Range Status  04/14/2018 84 70 - 99 mg/dL Final   Potassium  Date Value Ref Range Status  08/30/2023 4.3 3.5 - 5.2 mmol/L Final   Sodium  Date Value Ref Range Status  08/30/2023 139 134 - 144 mmol/L Final   Bilirubin Total  Date Value Ref Range Status  08/30/2023 <0.2 0.0 - 1.2 mg/dL Final   Bilirubin, Direct  Date Value Ref Range Status  04/05/2018 0.2 0.0 - 0.2 mg/dL Final    Comment:    Please note change in reference range.   Indirect Bilirubin  Date Value Ref Range Status  04/05/2018 0.4 0.3 - 0.9 mg/dL Final    Comment:    Performed at [facility], [address] [place], [place] [address]   Protein, ur  Date Value Ref Range Status  04/05/2018 30 (A) NEGATIVE mg/dL Final   Protein,UA  Date Value  Ref Range Status  07/30/2022 1+ (A) Negative/Trace Final   Total Protein  Date Value Ref Range Status  08/30/2023 6.6 6.0 - 8.5 g/dL Final   GFR calc Af Amer  Date Value Ref Range Status  11/24/2019 57 (L) >59 mL/min/1.73 Final   eGFR  Date Value Ref Range Status  08/30/2023 62 >59 mL/min/1.73 Final   GFR, Estimated  Date Value Ref Range Status  07/18/2020 >60 >60 mL/min Final    Comment:    (NOTE) Calculated using the CKD-EPI Creatinine  Equation (2021)           traZODone (DESYREL) 100 MG tablet [Pharmacy Med Name: TRAZODONE 100 MG TABLET] 135 tablet 1    Sig: TAKE 1 TO 1&1/2 TABLETS (100-150 MG TOTAL) BY MOUTH AT BEDTIME AS NEEDED FOR SLEEPSTRENGTH: 100 MG     Psychiatry: Antidepressants - Serotonin Modulator Passed - 12/14/2023  1:56 PM      Passed - Completed PHQ-2 or PHQ-9 in the last 360 days      Passed - Valid encounter within last 6 months    Recent Outpatient Visits           1 month ago Rib pain on right side   [facility] Mecum, Oswaldo Conroy, PA-C   3 months ago Routine general medical examination at a health care facility   [facility], Megan P, DO   8 months ago Anxiety   [place] [facility] [place], [place] P, DO   9 months ago Chronic right shoulder pain   [facility] [place], Megan P, DO   10 months ago Fatigue due to depression   [place] [facility] [place], Sherran Needs, NP       Future Appointments             In 2 months Laural Benes, Oralia Rud, DO [facility]

## 2023-12-18 ENCOUNTER — Other Ambulatory Visit: Payer: Self-pay | Admitting: Family Medicine

## 2023-12-20 NOTE — Telephone Encounter (Signed)
 Requested medication (s) are due for refill today: No  Requested medication (s) are on the active medication list: Yes  Last refill:    Future visit scheduled: Yes  Notes to clinic:  Seroquel not delegated.    Requested Prescriptions  Pending Prescriptions Disp Refills   traZODone (DESYREL) 100 MG tablet [Pharmacy Med Name: TRAZODONE 100 MG TABLET] 135 tablet 1    Sig: TAKE 1 TO 1&1/2 TABLETS (100-150 MG TOTAL) BY MOUTH AT BEDTIME AS NEEDED FOR SLEEPStrength: 100 mg     Psychiatry: Antidepressants - Serotonin Modulator Passed - 12/20/2023  3:35 PM      Passed - Completed PHQ-2 or PHQ-9 in the last 360 days      Passed - Valid encounter within last 6 months    Recent Outpatient Visits           2 months ago Rib pain on right side   [place] [facility] Mecum, Oswaldo Conroy, PA-C   3 months ago Routine general medical examination at a health care facility   [facility], [place] P, DO   8 months ago Anxiety   [place] [facility] [place], [place] P, DO   9 months ago Chronic right shoulder pain   [facility] [place], Megan P, DO   10 months ago Fatigue due to depression    [facility] [place], Sherran Needs, NP       Future Appointments             In 2 months Johnson, Oralia Rud, DO  [facility]             QUEtiapine Fumarate (SEROQUEL XR) 150 MG 24 hr tablet [Pharmacy Med Name: QUETIAPINE ER 150 MG TABLET] 90 tablet 1    Sig: TAKE 1 TABLET BY MOUTH AT BEDTIME.     Not Delegated - Psychiatry:  Antipsychotics - Second Generation (Atypical) - quetiapine Failed - 12/20/2023  3:35 PM      Failed - This refill cannot be delegated      Failed - Last Heart Rate in normal range    Pulse Readings from Last 1 Encounters:  10/21/23 (!) 120         Failed - Lipid Panel in normal range within the last 12 months    Cholesterol, Total  Date  Value Ref Range Status  08/30/2023 174 100 - 199 mg/dL Final   Cholesterol Piccolo, Waived  Date Value Ref Range Status  11/12/2016 173 <200 mg/dL Final    Comment:                            Desirable                <200                         Borderline High      200- 239                         High                     >239    LDL Chol Calc (NIH)  Date Value Ref Range Status  08/30/2023 93 0 - 99 mg/dL Final   HDL  Date Value Ref Range Status  08/30/2023  57 >39 mg/dL Final   Triglycerides  Date Value Ref Range Status  08/30/2023 135 0 - 149 mg/dL Final   Triglycerides Piccolo,Waived  Date Value Ref Range Status  11/12/2016 210 (H) <150 mg/dL Final    Comment:                            Normal                   <150                         Borderline High     150 - 199                         High                200 - 499                         Very High                >499          Passed - TSH in normal range and within 360 days    TSH  Date Value Ref Range Status  08/30/2023 1.140 0.450 - 4.500 uIU/mL Final         Passed - Completed PHQ-2 or PHQ-9 in the last 360 days      Passed - Last BP in normal range    BP Readings from Last 1 Encounters:  10/21/23 120/84         Passed - Valid encounter within last 6 months    Recent Outpatient Visits           2 months ago Rib pain on right side   [place] Mecum, Oswaldo Conroy, PA-C   3 months ago Routine general medical examination at a health care facility   [facility] [place], Megan P, DO   8 months ago Anxiety   [place] [facility] [place], [place] P, DO   9 months ago Chronic right shoulder pain   [place] [place], Megan P, DO   10 months ago Fatigue due to depression   [facility] [place], Sherran Needs, NP       Future Appointments             In 2 months Johnson, Megan P, DO  [place] [facility]            Passed - CBC within normal limits and completed in the last 12 months    WBC  Date Value Ref Range Status  08/30/2023 6.1 3.4 - 10.8 x10E3/uL Final  07/18/2020 4.6 4.0 - 10.5 K/uL Final   RBC  Date Value Ref Range Status  08/30/2023 3.54 (L) 3.77 - 5.28 x10E6/uL Final  07/18/2020 3.40 (L) 3.87 - 5.11 MIL/uL Final   Hemoglobin  Date Value Ref Range Status  08/30/2023 11.4 11.1 - 15.9 g/dL Final   Hematocrit  Date Value Ref Range Status  08/30/2023 35.8 34.0 - 46.6 % Final   MCHC  Date Value Ref Range Status  08/30/2023 31.8 31.5 - 35.7 g/dL Final  82/95/6213 08.6 30.0 - 36.0 g/dL Final   [facility]  Date Value Ref Range Status  08/30/2023 32.2 26.6 - 33.0 pg Final  07/18/2020 32.9 26.0 - 34.0 pg Final   MCV  Date Value Ref Range Status  08/30/2023 101 (H) 79 - 97 fL Final   No results found for: "PLTCOUNTKUC", "LABPLAT", "POCPLA" RDW  Date Value Ref Range Status  08/30/2023 11.8 11.7 - 15.4 % Final         Passed - CMP within normal limits and completed in the last 12 months    Albumin  Date Value Ref Range Status  08/30/2023 3.9 3.8 - 4.8 g/dL Final   Alkaline Phosphatase  Date Value Ref Range Status  08/30/2023 103 44 - 121 IU/L Final   ALT  Date Value Ref Range Status  08/30/2023 9 0 - 32 IU/L Final   ALT (SGPT) Piccolo, Waived  Date Value Ref Range Status  11/12/2016 15 10 - 47 U/L Final   AST  Date Value Ref Range Status  08/30/2023 15 0 - 40 IU/L Final   AST (SGOT) Piccolo, Waived  Date Value Ref Range Status  11/12/2016 19 11 - 38 U/L Final   BUN  Date Value Ref Range Status  08/30/2023 16 8 - 27 mg/dL Final   Calcium  Date Value Ref Range Status  08/30/2023 9.2 8.7 - 10.3 mg/dL Final   CO2  Date Value Ref Range Status  08/30/2023 23 20 - 29 mmol/L Final   Bicarbonate  Date Value Ref Range Status  04/07/2018 20.1 20.0 - 28.0 mmol/L Corrected   Creatinine, Ser  Date Value Ref  Range Status  08/30/2023 0.97 0.57 - 1.00 mg/dL Final   Glucose  Date Value Ref Range Status  08/30/2023 87 70 - 99 mg/dL Final   Glucose, Bld  Date Value Ref Range Status  07/18/2020 81 70 - 99 mg/dL Final    Comment:    Glucose reference range applies only to samples taken after fasting for at least 8 hours.   Glucose-Capillary  Date Value Ref Range Status  04/14/2018 84 70 - 99 mg/dL Final   Potassium  Date Value Ref Range Status  08/30/2023 4.3 3.5 - 5.2 mmol/L Final   Sodium  Date Value Ref Range Status  08/30/2023 139 134 - 144 mmol/L Final   Bilirubin Total  Date Value Ref Range Status  08/30/2023 <0.2 0.0 - 1.2 mg/dL Final   Bilirubin, Direct  Date Value Ref Range Status  04/05/2018 0.2 0.0 - 0.2 mg/dL Final    Comment:    Please note change in reference range.   Indirect Bilirubin  Date Value Ref Range Status  04/05/2018 0.4 0.3 - 0.9 mg/dL Final    Comment:    Performed at [facility], [address] [place], [place] [address]   Protein, ur  Date Value Ref Range Status  04/05/2018 30 (A) NEGATIVE mg/dL Final   Protein,UA  Date Value Ref Range Status  07/30/2022 1+ (A) Negative/Trace Final   Total Protein  Date Value Ref Range Status  08/30/2023 6.6 6.0 - 8.5 g/dL Final   GFR calc Af Amer  Date Value Ref Range Status  11/24/2019 57 (L) >59 mL/min/1.73 Final   eGFR  Date Value Ref Range Status  08/30/2023 62 >59 mL/min/1.73 Final   GFR, Estimated  Date Value Ref Range Status  07/18/2020 >60 >60 mL/min Final    Comment:    (NOTE) Calculated using the CKD-EPI Creatinine Equation (2021)

## 2023-12-22 ENCOUNTER — Other Ambulatory Visit: Payer: Self-pay | Admitting: Family Medicine

## 2023-12-22 NOTE — Telephone Encounter (Signed)
 Requested medication (s) are due for refill today - no  Requested medication (s) are on the active medication list -yes  Future visit scheduled -yes  Last refill: 08/30/23 #90 1RF  Notes to clinic: non delegated Rx  Requested Prescriptions  Pending Prescriptions Disp Refills   QUEtiapine Fumarate (SEROQUEL XR) 150 MG 24 hr tablet [Pharmacy Med Name: QUETIAPINE ER 150 MG TABLET] 90 tablet 1    Sig: TAKE 1 TABLET BY MOUTH AT BEDTIME.     Not Delegated - Psychiatry:  Antipsychotics - Second Generation (Atypical) - quetiapine Failed - 12/22/2023  3:42 PM      Failed - This refill cannot be delegated      Failed - Last Heart Rate in normal range    Pulse Readings from Last 1 Encounters:  10/21/23 (!) 120         Failed - Valid encounter within last 6 months    Recent Outpatient Visits   None     Future Appointments             In 2 months Johnson, Megan P, DO [place] [facility]            Failed - Lipid Panel in normal range within the last 12 months    Cholesterol, Total  Date Value Ref Range Status  08/30/2023 174 100 - 199 mg/dL Final   Cholesterol Piccolo, Waived  Date Value Ref Range Status  11/12/2016 173 <200 mg/dL Final    Comment:                            Desirable                <200                         Borderline High      200- 239                         High                     >239    LDL Chol Calc (NIH)  Date Value Ref Range Status  08/30/2023 93 0 - 99 mg/dL Final   HDL  Date Value Ref Range Status  08/30/2023 57 >39 mg/dL Final   Triglycerides  Date Value Ref Range Status  08/30/2023 135 0 - 149 mg/dL Final   Triglycerides Piccolo,Waived  Date Value Ref Range Status  11/12/2016 210 (H) <150 mg/dL Final    Comment:                            Normal                   <150                         Borderline High     150 - 199                         High                200 - 499  Very  High                >499          Passed - TSH in normal range and within 360 days    TSH  Date Value Ref Range Status  08/30/2023 1.140 0.450 - 4.500 uIU/mL Final         Passed - Completed PHQ-2 or PHQ-9 in the last 360 days      Passed - Last BP in normal range    BP Readings from Last 1 Encounters:  10/21/23 120/84         Passed - CBC within normal limits and completed in the last 12 months    WBC  Date Value Ref Range Status  08/30/2023 6.1 3.4 - 10.8 x10E3/uL Final  07/18/2020 4.6 4.0 - 10.5 K/uL Final   RBC  Date Value Ref Range Status  08/30/2023 3.54 (L) 3.77 - 5.28 x10E6/uL Final  07/18/2020 3.40 (L) 3.87 - 5.11 MIL/uL Final   Hemoglobin  Date Value Ref Range Status  08/30/2023 11.4 11.1 - 15.9 g/dL Final   Hematocrit  Date Value Ref Range Status  08/30/2023 35.8 34.0 - 46.6 % Final   MCHC  Date Value Ref Range Status  08/30/2023 31.8 31.5 - 35.7 g/dL Final  16/06/9603 54.0 30.0 - 36.0 g/dL Final   [facility]  Date Value Ref Range Status  08/30/2023 32.2 26.6 - 33.0 pg Final  07/18/2020 32.9 26.0 - 34.0 pg Final   MCV  Date Value Ref Range Status  08/30/2023 101 (H) 79 - 97 fL Final   No results found for: "PLTCOUNTKUC", "LABPLAT", "POCPLA" RDW  Date Value Ref Range Status  08/30/2023 11.8 11.7 - 15.4 % Final         Passed - CMP within normal limits and completed in the last 12 months    Albumin  Date Value Ref Range Status  08/30/2023 3.9 3.8 - 4.8 g/dL Final   Alkaline Phosphatase  Date Value Ref Range Status  08/30/2023 103 44 - 121 IU/L Final   ALT  Date Value Ref Range Status  08/30/2023 9 0 - 32 IU/L Final   ALT (SGPT) Piccolo, Waived  Date Value Ref Range Status  11/12/2016 15 10 - 47 U/L Final   AST  Date Value Ref Range Status  08/30/2023 15 0 - 40 IU/L Final   AST (SGOT) Piccolo, Waived  Date Value Ref Range Status  11/12/2016 19 11 - 38 U/L Final   BUN  Date Value Ref Range Status  08/30/2023 16 8 - 27 mg/dL Final    Calcium  Date Value Ref Range Status  08/30/2023 9.2 8.7 - 10.3 mg/dL Final   CO2  Date Value Ref Range Status  08/30/2023 23 20 - 29 mmol/L Final   Bicarbonate  Date Value Ref Range Status  04/07/2018 20.1 20.0 - 28.0 mmol/L Corrected   Creatinine, Ser  Date Value Ref Range Status  08/30/2023 0.97 0.57 - 1.00 mg/dL Final   Glucose  Date Value Ref Range Status  08/30/2023 87 70 - 99 mg/dL Final   Glucose, Bld  Date Value Ref Range Status  07/18/2020 81 70 - 99 mg/dL Final    Comment:    Glucose reference range applies only to samples taken after fasting for at least 8 hours.   Glucose-Capillary  Date Value Ref Range Status  04/14/2018 84 70 - 99 mg/dL Final   Potassium  Date Value Ref Range Status  08/30/2023 4.3 3.5 - 5.2 mmol/L Final   Sodium  Date Value Ref Range Status  08/30/2023 139 134 - 144 mmol/L Final   Bilirubin Total  Date Value Ref Range Status  08/30/2023 <0.2 0.0 - 1.2 mg/dL Final   Bilirubin, Direct  Date Value Ref Range Status  04/05/2018 0.2 0.0 - 0.2 mg/dL Final    Comment:    Please note change in reference range.   Indirect Bilirubin  Date Value Ref Range Status  04/05/2018 0.4 0.3 - 0.9 mg/dL Final    Comment:    Performed at [facility], [address] [place], [place] [address]   Protein, ur  Date Value Ref Range Status  04/05/2018 30 (A) NEGATIVE mg/dL Final   Protein,UA  Date Value Ref Range Status  07/30/2022 1+ (A) Negative/Trace Final   Total Protein  Date Value Ref Range Status  08/30/2023 6.6 6.0 - 8.5 g/dL Final   GFR calc Af Amer  Date Value Ref Range Status  11/24/2019 57 (L) >59 mL/min/1.73 Final   eGFR  Date Value Ref Range Status  08/30/2023 62 >59 mL/min/1.73 Final   GFR, Estimated  Date Value Ref Range Status  07/18/2020 >60 >60 mL/min Final    Comment:    (NOTE) Calculated using the CKD-EPI Creatinine Equation (2021)             Requested Prescriptions  Pending  Prescriptions Disp Refills   QUEtiapine Fumarate (SEROQUEL XR) 150 MG 24 hr tablet [Pharmacy Med Name: QUETIAPINE ER 150 MG TABLET] 90 tablet 1    Sig: TAKE 1 TABLET BY MOUTH AT BEDTIME.     Not Delegated - Psychiatry:  Antipsychotics - Second Generation (Atypical) - quetiapine Failed - 12/22/2023  3:42 PM      Failed - This refill cannot be delegated      Failed - Last Heart Rate in normal range    Pulse Readings from Last 1 Encounters:  10/21/23 (!) 120         Failed - Valid encounter within last 6 months    Recent Outpatient Visits   None     Future Appointments             In 2 months Johnson, Megan P, DO [place] [facility]            Failed - Lipid Panel in normal range within the last 12 months    Cholesterol, Total  Date Value Ref Range Status  08/30/2023 174 100 - 199 mg/dL Final   Cholesterol Piccolo, Waived  Date Value Ref Range Status  11/12/2016 173 <200 mg/dL Final    Comment:                            Desirable                <200                         Borderline High      200- 239                         High                     >239    LDL Chol Calc (NIH)  Date Value Ref Range Status  08/30/2023 93 0 - 99 mg/dL  Final   HDL  Date Value Ref Range Status  08/30/2023 57 >39 mg/dL Final   Triglycerides  Date Value Ref Range Status  08/30/2023 135 0 - 149 mg/dL Final   Triglycerides Piccolo,Waived  Date Value Ref Range Status  11/12/2016 210 (H) <150 mg/dL Final    Comment:                            Normal                   <150                         Borderline High     150 - 199                         High                200 - 499                         Very High                >499          Passed - TSH in normal range and within 360 days    TSH  Date Value Ref Range Status  08/30/2023 1.140 0.450 - 4.500 uIU/mL Final         Passed - Completed PHQ-2 or PHQ-9 in the last 360 days      Passed - Last BP  in normal range    BP Readings from Last 1 Encounters:  10/21/23 120/84         Passed - CBC within normal limits and completed in the last 12 months    WBC  Date Value Ref Range Status  08/30/2023 6.1 3.4 - 10.8 x10E3/uL Final  07/18/2020 4.6 4.0 - 10.5 K/uL Final   RBC  Date Value Ref Range Status  08/30/2023 3.54 (L) 3.77 - 5.28 x10E6/uL Final  07/18/2020 3.40 (L) 3.87 - 5.11 MIL/uL Final   Hemoglobin  Date Value Ref Range Status  08/30/2023 11.4 11.1 - 15.9 g/dL Final   Hematocrit  Date Value Ref Range Status  08/30/2023 35.8 34.0 - 46.6 % Final   MCHC  Date Value Ref Range Status  08/30/2023 31.8 31.5 - 35.7 g/dL Final  16/06/9603 54.0 30.0 - 36.0 g/dL Final   [facility]  Date Value Ref Range Status  08/30/2023 32.2 26.6 - 33.0 pg Final  07/18/2020 32.9 26.0 - 34.0 pg Final   MCV  Date Value Ref Range Status  08/30/2023 101 (H) 79 - 97 fL Final   No results found for: "PLTCOUNTKUC", "LABPLAT", "POCPLA" RDW  Date Value Ref Range Status  08/30/2023 11.8 11.7 - 15.4 % Final         Passed - CMP within normal limits and completed in the last 12 months    Albumin  Date Value Ref Range Status  08/30/2023 3.9 3.8 - 4.8 g/dL Final   Alkaline Phosphatase  Date Value Ref Range Status  08/30/2023 103 44 - 121 IU/L Final   ALT  Date Value Ref Range Status  08/30/2023 9 0 - 32 IU/L Final   ALT (SGPT) Piccolo, Waived  Date Value Ref Range Status  11/12/2016 15 10 - 47 U/L Final   AST  Date Value  Ref Range Status  08/30/2023 15 0 - 40 IU/L Final   AST (SGOT) Piccolo, Waived  Date Value Ref Range Status  11/12/2016 19 11 - 38 U/L Final   BUN  Date Value Ref Range Status  08/30/2023 16 8 - 27 mg/dL Final   Calcium  Date Value Ref Range Status  08/30/2023 9.2 8.7 - 10.3 mg/dL Final   CO2  Date Value Ref Range Status  08/30/2023 23 20 - 29 mmol/L Final   Bicarbonate  Date Value Ref Range Status  04/07/2018 20.1 20.0 - 28.0 mmol/L Corrected    Creatinine, Ser  Date Value Ref Range Status  08/30/2023 0.97 0.57 - 1.00 mg/dL Final   Glucose  Date Value Ref Range Status  08/30/2023 87 70 - 99 mg/dL Final   Glucose, Bld  Date Value Ref Range Status  07/18/2020 81 70 - 99 mg/dL Final    Comment:    Glucose reference range applies only to samples taken after fasting for at least 8 hours.   Glucose-Capillary  Date Value Ref Range Status  04/14/2018 84 70 - 99 mg/dL Final   Potassium  Date Value Ref Range Status  08/30/2023 4.3 3.5 - 5.2 mmol/L Final   Sodium  Date Value Ref Range Status  08/30/2023 139 134 - 144 mmol/L Final   Bilirubin Total  Date Value Ref Range Status  08/30/2023 <0.2 0.0 - 1.2 mg/dL Final   Bilirubin, Direct  Date Value Ref Range Status  04/05/2018 0.2 0.0 - 0.2 mg/dL Final    Comment:    Please note change in reference range.   Indirect Bilirubin  Date Value Ref Range Status  04/05/2018 0.4 0.3 - 0.9 mg/dL Final    Comment:    Performed at [facility], [address] [place], [place] [address]   Protein, ur  Date Value Ref Range Status  04/05/2018 30 (A) NEGATIVE mg/dL Final   Protein,UA  Date Value Ref Range Status  07/30/2022 1+ (A) Negative/Trace Final   Total Protein  Date Value Ref Range Status  08/30/2023 6.6 6.0 - 8.5 g/dL Final   GFR calc Af Amer  Date Value Ref Range Status  11/24/2019 57 (L) >59 mL/min/1.73 Final   eGFR  Date Value Ref Range Status  08/30/2023 62 >59 mL/min/1.73 Final   GFR, Estimated  Date Value Ref Range Status  07/18/2020 >60 >60 mL/min Final    Comment:    (NOTE) Calculated using the CKD-EPI Creatinine Equation (2021)

## 2023-12-26 ENCOUNTER — Other Ambulatory Visit: Payer: Self-pay | Admitting: Family Medicine

## 2023-12-28 NOTE — Telephone Encounter (Signed)
 Requested medication (s) are due for refill today - no  Requested medication (s) are on the active medication list -yes  Future visit scheduled -yes  Last refill: 08/30/23 #90 1RF  Notes to clinic: non delegated Rx  Requested Prescriptions  Pending Prescriptions Disp Refills   QUEtiapine Fumarate (SEROQUEL XR) 150 MG 24 hr tablet [Pharmacy Med Name: QUETIAPINE ER 150 MG TABLET] 90 tablet 1    Sig: TAKE 1 TABLET BY MOUTH AT BEDTIME.     Not Delegated - Psychiatry:  Antipsychotics - Second Generation (Atypical) - quetiapine Failed - 12/28/2023  4:04 PM      Failed - This refill cannot be delegated      Failed - Last Heart Rate in normal range    Pulse Readings from Last 1 Encounters:  10/21/23 (!) 120         Failed - Valid encounter within last 6 months    Recent Outpatient Visits   None     Future Appointments             In 2 months Johnson, Megan P, DO [place] [facility]            Failed - Lipid Panel in normal range within the last 12 months    Cholesterol, Total  Date Value Ref Range Status  08/30/2023 174 100 - 199 mg/dL Final   Cholesterol Piccolo, Waived  Date Value Ref Range Status  11/12/2016 173 <200 mg/dL Final    Comment:                            Desirable                <200                         Borderline High      200- 239                         High                     >239    LDL Chol Calc (NIH)  Date Value Ref Range Status  08/30/2023 93 0 - 99 mg/dL Final   HDL  Date Value Ref Range Status  08/30/2023 57 >39 mg/dL Final   Triglycerides  Date Value Ref Range Status  08/30/2023 135 0 - 149 mg/dL Final   Triglycerides Piccolo,Waived  Date Value Ref Range Status  11/12/2016 210 (H) <150 mg/dL Final    Comment:                            Normal                   <150                         Borderline High     150 - 199                         High                200 - 499  Very High                 >499          Passed - TSH in normal range and within 360 days    TSH  Date Value Ref Range Status  08/30/2023 1.140 0.450 - 4.500 uIU/mL Final         Passed - Completed PHQ-2 or PHQ-9 in the last 360 days      Passed - Last BP in normal range    BP Readings from Last 1 Encounters:  10/21/23 120/84         Passed - CBC within normal limits and completed in the last 12 months    WBC  Date Value Ref Range Status  08/30/2023 6.1 3.4 - 10.8 x10E3/uL Final  07/18/2020 4.6 4.0 - 10.5 K/uL Final   RBC  Date Value Ref Range Status  08/30/2023 3.54 (L) 3.77 - 5.28 x10E6/uL Final  07/18/2020 3.40 (L) 3.87 - 5.11 MIL/uL Final   Hemoglobin  Date Value Ref Range Status  08/30/2023 11.4 11.1 - 15.9 g/dL Final   Hematocrit  Date Value Ref Range Status  08/30/2023 35.8 34.0 - 46.6 % Final   MCHC  Date Value Ref Range Status  08/30/2023 31.8 31.5 - 35.7 g/dL Final  54/05/8118 14.7 30.0 - 36.0 g/dL Final   [facility]  Date Value Ref Range Status  08/30/2023 32.2 26.6 - 33.0 pg Final  07/18/2020 32.9 26.0 - 34.0 pg Final   MCV  Date Value Ref Range Status  08/30/2023 101 (H) 79 - 97 fL Final   No results found for: "PLTCOUNTKUC", "LABPLAT", "POCPLA" RDW  Date Value Ref Range Status  08/30/2023 11.8 11.7 - 15.4 % Final         Passed - CMP within normal limits and completed in the last 12 months    Albumin  Date Value Ref Range Status  08/30/2023 3.9 3.8 - 4.8 g/dL Final   Alkaline Phosphatase  Date Value Ref Range Status  08/30/2023 103 44 - 121 IU/L Final   ALT  Date Value Ref Range Status  08/30/2023 9 0 - 32 IU/L Final   ALT (SGPT) Piccolo, Waived  Date Value Ref Range Status  11/12/2016 15 10 - 47 U/L Final   AST  Date Value Ref Range Status  08/30/2023 15 0 - 40 IU/L Final   AST (SGOT) Piccolo, Waived  Date Value Ref Range Status  11/12/2016 19 11 - 38 U/L Final   BUN  Date Value Ref Range Status  08/30/2023 16 8 - 27 mg/dL Final    Calcium  Date Value Ref Range Status  08/30/2023 9.2 8.7 - 10.3 mg/dL Final   CO2  Date Value Ref Range Status  08/30/2023 23 20 - 29 mmol/L Final   Bicarbonate  Date Value Ref Range Status  04/07/2018 20.1 20.0 - 28.0 mmol/L Corrected   Creatinine, Ser  Date Value Ref Range Status  08/30/2023 0.97 0.57 - 1.00 mg/dL Final   Glucose  Date Value Ref Range Status  08/30/2023 87 70 - 99 mg/dL Final   Glucose, Bld  Date Value Ref Range Status  07/18/2020 81 70 - 99 mg/dL Final    Comment:    Glucose reference range applies only to samples taken after fasting for at least 8 hours.   Glucose-Capillary  Date Value Ref Range Status  04/14/2018 84 70 - 99 mg/dL Final   Potassium  Date Value Ref Range Status  08/30/2023 4.3 3.5 - 5.2 mmol/L Final   Sodium  Date Value Ref Range Status  08/30/2023 139 134 - 144 mmol/L Final   Bilirubin Total  Date Value Ref Range Status  08/30/2023 <0.2 0.0 - 1.2 mg/dL Final   Bilirubin, Direct  Date Value Ref Range Status  04/05/2018 0.2 0.0 - 0.2 mg/dL Final    Comment:    Please note change in reference range.   Indirect Bilirubin  Date Value Ref Range Status  04/05/2018 0.4 0.3 - 0.9 mg/dL Final    Comment:    Performed at [facility], [address] [place], [place] [address]   Protein, ur  Date Value Ref Range Status  04/05/2018 30 (A) NEGATIVE mg/dL Final   Protein,UA  Date Value Ref Range Status  07/30/2022 1+ (A) Negative/Trace Final   Total Protein  Date Value Ref Range Status  08/30/2023 6.6 6.0 - 8.5 g/dL Final   GFR calc Af Amer  Date Value Ref Range Status  11/24/2019 57 (L) >59 mL/min/1.73 Final   eGFR  Date Value Ref Range Status  08/30/2023 62 >59 mL/min/1.73 Final   GFR, Estimated  Date Value Ref Range Status  07/18/2020 >60 >60 mL/min Final    Comment:    (NOTE) Calculated using the CKD-EPI Creatinine Equation (2021)             Requested Prescriptions  Pending  Prescriptions Disp Refills   QUEtiapine Fumarate (SEROQUEL XR) 150 MG 24 hr tablet [Pharmacy Med Name: QUETIAPINE ER 150 MG TABLET] 90 tablet 1    Sig: TAKE 1 TABLET BY MOUTH AT BEDTIME.     Not Delegated - Psychiatry:  Antipsychotics - Second Generation (Atypical) - quetiapine Failed - 12/28/2023  4:04 PM      Failed - This refill cannot be delegated      Failed - Last Heart Rate in normal range    Pulse Readings from Last 1 Encounters:  10/21/23 (!) 120         Failed - Valid encounter within last 6 months    Recent Outpatient Visits   None     Future Appointments             In 2 months Johnson, Megan P, DO [place] [facility]            Failed - Lipid Panel in normal range within the last 12 months    Cholesterol, Total  Date Value Ref Range Status  08/30/2023 174 100 - 199 mg/dL Final   Cholesterol Piccolo, Waived  Date Value Ref Range Status  11/12/2016 173 <200 mg/dL Final    Comment:                            Desirable                <200                         Borderline High      200- 239                         High                     >239    LDL Chol Calc (NIH)  Date Value Ref Range Status  08/30/2023 93 0 - 99 mg/dL  Final   HDL  Date Value Ref Range Status  08/30/2023 57 >39 mg/dL Final   Triglycerides  Date Value Ref Range Status  08/30/2023 135 0 - 149 mg/dL Final   Triglycerides Piccolo,Waived  Date Value Ref Range Status  11/12/2016 210 (H) <150 mg/dL Final    Comment:                            Normal                   <150                         Borderline High     150 - 199                         High                200 - 499                         Very High                >499          Passed - TSH in normal range and within 360 days    TSH  Date Value Ref Range Status  08/30/2023 1.140 0.450 - 4.500 uIU/mL Final         Passed - Completed PHQ-2 or PHQ-9 in the last 360 days      Passed - Last BP  in normal range    BP Readings from Last 1 Encounters:  10/21/23 120/84         Passed - CBC within normal limits and completed in the last 12 months    WBC  Date Value Ref Range Status  08/30/2023 6.1 3.4 - 10.8 x10E3/uL Final  07/18/2020 4.6 4.0 - 10.5 K/uL Final   RBC  Date Value Ref Range Status  08/30/2023 3.54 (L) 3.77 - 5.28 x10E6/uL Final  07/18/2020 3.40 (L) 3.87 - 5.11 MIL/uL Final   Hemoglobin  Date Value Ref Range Status  08/30/2023 11.4 11.1 - 15.9 g/dL Final   Hematocrit  Date Value Ref Range Status  08/30/2023 35.8 34.0 - 46.6 % Final   MCHC  Date Value Ref Range Status  08/30/2023 31.8 31.5 - 35.7 g/dL Final  40/98/1191 47.8 30.0 - 36.0 g/dL Final   [facility]  Date Value Ref Range Status  08/30/2023 32.2 26.6 - 33.0 pg Final  07/18/2020 32.9 26.0 - 34.0 pg Final   MCV  Date Value Ref Range Status  08/30/2023 101 (H) 79 - 97 fL Final   No results found for: "PLTCOUNTKUC", "LABPLAT", "POCPLA" RDW  Date Value Ref Range Status  08/30/2023 11.8 11.7 - 15.4 % Final         Passed - CMP within normal limits and completed in the last 12 months    Albumin  Date Value Ref Range Status  08/30/2023 3.9 3.8 - 4.8 g/dL Final   Alkaline Phosphatase  Date Value Ref Range Status  08/30/2023 103 44 - 121 IU/L Final   ALT  Date Value Ref Range Status  08/30/2023 9 0 - 32 IU/L Final   ALT (SGPT) Piccolo, Waived  Date Value Ref Range Status  11/12/2016 15 10 - 47 U/L Final   AST  Date Value  Ref Range Status  08/30/2023 15 0 - 40 IU/L Final   AST (SGOT) Piccolo, Waived  Date Value Ref Range Status  11/12/2016 19 11 - 38 U/L Final   BUN  Date Value Ref Range Status  08/30/2023 16 8 - 27 mg/dL Final   Calcium  Date Value Ref Range Status  08/30/2023 9.2 8.7 - 10.3 mg/dL Final   CO2  Date Value Ref Range Status  08/30/2023 23 20 - 29 mmol/L Final   Bicarbonate  Date Value Ref Range Status  04/07/2018 20.1 20.0 - 28.0 mmol/L Corrected    Creatinine, Ser  Date Value Ref Range Status  08/30/2023 0.97 0.57 - 1.00 mg/dL Final   Glucose  Date Value Ref Range Status  08/30/2023 87 70 - 99 mg/dL Final   Glucose, Bld  Date Value Ref Range Status  07/18/2020 81 70 - 99 mg/dL Final    Comment:    Glucose reference range applies only to samples taken after fasting for at least 8 hours.   Glucose-Capillary  Date Value Ref Range Status  04/14/2018 84 70 - 99 mg/dL Final   Potassium  Date Value Ref Range Status  08/30/2023 4.3 3.5 - 5.2 mmol/L Final   Sodium  Date Value Ref Range Status  08/30/2023 139 134 - 144 mmol/L Final   Bilirubin Total  Date Value Ref Range Status  08/30/2023 <0.2 0.0 - 1.2 mg/dL Final   Bilirubin, Direct  Date Value Ref Range Status  04/05/2018 0.2 0.0 - 0.2 mg/dL Final    Comment:    Please note change in reference range.   Indirect Bilirubin  Date Value Ref Range Status  04/05/2018 0.4 0.3 - 0.9 mg/dL Final    Comment:    Performed at [facility], [address] [place], [place] [address]   Protein, ur  Date Value Ref Range Status  04/05/2018 30 (A) NEGATIVE mg/dL Final   Protein,UA  Date Value Ref Range Status  07/30/2022 1+ (A) Negative/Trace Final   Total Protein  Date Value Ref Range Status  08/30/2023 6.6 6.0 - 8.5 g/dL Final   GFR calc Af Amer  Date Value Ref Range Status  11/24/2019 57 (L) >59 mL/min/1.73 Final   eGFR  Date Value Ref Range Status  08/30/2023 62 >59 mL/min/1.73 Final   GFR, Estimated  Date Value Ref Range Status  07/18/2020 >60 >60 mL/min Final    Comment:    (NOTE) Calculated using the CKD-EPI Creatinine Equation (2021)

## 2023-12-29 ENCOUNTER — Other Ambulatory Visit: Payer: Self-pay | Admitting: Family Medicine

## 2023-12-29 NOTE — Telephone Encounter (Signed)
 Copied from CRM [id number]. Topic: Clinical - Medication Refill >> Dec 29, 2023  9:43 AM Elle L wrote: Most Recent Primary Care Visit:  Provider: [facility] Crosby  Department: [id number]  Visit Type: OFFICE VISIT  Date: 10/21/2023  Medication: QUEtiapine Fumarate (SEROQUEL XR) 150 MG 24 hr tablet  Has the patient contacted their pharmacy? Yes  Is this the correct pharmacy for this prescription? Yes  This is the patient's preferred pharmacy:  [id number] - [place], [place] - [address] [place] [address] Phone: [phone] Fax: [phone]   Has the prescription been filled recently? No  Is the patient out of the medication? No, 2 pills left.   Has the patient been seen for an appointment in the last year OR does the patient have an upcoming appointment? Yes  Can we respond through MyChart? Yes  Agent: Please be advised that Rx refills may take up to 3 business days. We ask that you follow-up with your pharmacy.

## 2023-12-30 NOTE — Telephone Encounter (Signed)
 Requested medication (s) are due for refill today:   Provider to review  Requested medication (s) are on the active medication list:   Yes  Future visit scheduled:   Yes 02/28/2024 with Dr. Laural Benes     LOV 10/21/2023 with Erin Mecum, PA-C   Last ordered: 08/30/2023 #90, 1 refill  Non delegated refill reason returned.   Requested Prescriptions  Pending Prescriptions Disp Refills   QUEtiapine Fumarate (SEROQUEL XR) 150 MG 24 hr tablet 90 tablet 1    Sig: Take 1 tablet (150 mg total) by mouth at bedtime.     Not Delegated - Psychiatry:  Antipsychotics - Second Generation (Atypical) - quetiapine Failed - 12/30/2023  3:15 PM      Failed - This refill cannot be delegated      Failed - Last Heart Rate in normal range    Pulse Readings from Last 1 Encounters:  10/21/23 (!) 120         Failed - Valid encounter within last 6 months    Recent Outpatient Visits   None     Future Appointments             In 2 months Johnson, Megan P, DO [place] [facility]            Failed - Lipid Panel in normal range within the last 12 months    Cholesterol, Total  Date Value Ref Range Status  08/30/2023 174 100 - 199 mg/dL Final   Cholesterol Piccolo, Waived  Date Value Ref Range Status  11/12/2016 173 <200 mg/dL Final    Comment:                            Desirable                <200                         Borderline High      200- 239                         High                     >239    LDL Chol Calc (NIH)  Date Value Ref Range Status  08/30/2023 93 0 - 99 mg/dL Final   HDL  Date Value Ref Range Status  08/30/2023 57 >39 mg/dL Final   Triglycerides  Date Value Ref Range Status  08/30/2023 135 0 - 149 mg/dL Final   Triglycerides Piccolo,Waived  Date Value Ref Range Status  11/12/2016 210 (H) <150 mg/dL Final    Comment:                            Normal                   <150                         Borderline High     150 - 199                          High                200 - 499  Very High                >499          Passed - TSH in normal range and within 360 days    TSH  Date Value Ref Range Status  08/30/2023 1.140 0.450 - 4.500 uIU/mL Final         Passed - Completed PHQ-2 or PHQ-9 in the last 360 days      Passed - Last BP in normal range    BP Readings from Last 1 Encounters:  10/21/23 120/84         Passed - CBC within normal limits and completed in the last 12 months    WBC  Date Value Ref Range Status  08/30/2023 6.1 3.4 - 10.8 x10E3/uL Final  07/18/2020 4.6 4.0 - 10.5 K/uL Final   RBC  Date Value Ref Range Status  08/30/2023 3.54 (L) 3.77 - 5.28 x10E6/uL Final  07/18/2020 3.40 (L) 3.87 - 5.11 MIL/uL Final   Hemoglobin  Date Value Ref Range Status  08/30/2023 11.4 11.1 - 15.9 g/dL Final   Hematocrit  Date Value Ref Range Status  08/30/2023 35.8 34.0 - 46.6 % Final   MCHC  Date Value Ref Range Status  08/30/2023 31.8 31.5 - 35.7 g/dL Final  16/06/9603 54.0 30.0 - 36.0 g/dL Final   [facility]  Date Value Ref Range Status  08/30/2023 32.2 26.6 - 33.0 pg Final  07/18/2020 32.9 26.0 - 34.0 pg Final   MCV  Date Value Ref Range Status  08/30/2023 101 (H) 79 - 97 fL Final   No results found for: "PLTCOUNTKUC", "LABPLAT", "POCPLA" RDW  Date Value Ref Range Status  08/30/2023 11.8 11.7 - 15.4 % Final         Passed - CMP within normal limits and completed in the last 12 months    Albumin  Date Value Ref Range Status  08/30/2023 3.9 3.8 - 4.8 g/dL Final   Alkaline Phosphatase  Date Value Ref Range Status  08/30/2023 103 44 - 121 IU/L Final   ALT  Date Value Ref Range Status  08/30/2023 9 0 - 32 IU/L Final   ALT (SGPT) Piccolo, Waived  Date Value Ref Range Status  11/12/2016 15 10 - 47 U/L Final   AST  Date Value Ref Range Status  08/30/2023 15 0 - 40 IU/L Final   AST (SGOT) Piccolo, Waived  Date Value Ref Range Status  11/12/2016 19 11 - 38 U/L Final   BUN   Date Value Ref Range Status  08/30/2023 16 8 - 27 mg/dL Final   Calcium  Date Value Ref Range Status  08/30/2023 9.2 8.7 - 10.3 mg/dL Final   CO2  Date Value Ref Range Status  08/30/2023 23 20 - 29 mmol/L Final   Bicarbonate  Date Value Ref Range Status  04/07/2018 20.1 20.0 - 28.0 mmol/L Corrected   Creatinine, Ser  Date Value Ref Range Status  08/30/2023 0.97 0.57 - 1.00 mg/dL Final   Glucose  Date Value Ref Range Status  08/30/2023 87 70 - 99 mg/dL Final   Glucose, Bld  Date Value Ref Range Status  07/18/2020 81 70 - 99 mg/dL Final    Comment:    Glucose reference range applies only to samples taken after fasting for at least 8 hours.   Glucose-Capillary  Date Value Ref Range Status  04/14/2018 84 70 - 99 mg/dL Final   Potassium  Date Value Ref Range Status  08/30/2023 4.3 3.5 - 5.2 mmol/L Final   Sodium  Date Value Ref Range Status  08/30/2023 139 134 - 144 mmol/L Final   Bilirubin Total  Date Value Ref Range Status  08/30/2023 <0.2 0.0 - 1.2 mg/dL Final   Bilirubin, Direct  Date Value Ref Range Status  04/05/2018 0.2 0.0 - 0.2 mg/dL Final    Comment:    Please note change in reference range.   Indirect Bilirubin  Date Value Ref Range Status  04/05/2018 0.4 0.3 - 0.9 mg/dL Final    Comment:    Performed at [facility], [address] [place], [place] [address]   Protein, ur  Date Value Ref Range Status  04/05/2018 30 (A) NEGATIVE mg/dL Final   Protein,UA  Date Value Ref Range Status  07/30/2022 1+ (A) Negative/Trace Final   Total Protein  Date Value Ref Range Status  08/30/2023 6.6 6.0 - 8.5 g/dL Final   GFR calc Af Amer  Date Value Ref Range Status  11/24/2019 57 (L) >59 mL/min/1.73 Final   eGFR  Date Value Ref Range Status  08/30/2023 62 >59 mL/min/1.73 Final   GFR, Estimated  Date Value Ref Range Status  07/18/2020 >60 >60 mL/min Final    Comment:    (NOTE) Calculated using the CKD-EPI Creatinine Equation (2021)

## 2023-12-31 NOTE — Telephone Encounter (Signed)
 6 month supply confirmed. Refill not required at this time.

## 2023-12-31 NOTE — Telephone Encounter (Signed)
 6 month supply sent in in December- does she need this? She's asking for it 2 months early

## 2024-01-03 ENCOUNTER — Other Ambulatory Visit: Payer: Self-pay | Admitting: Family Medicine

## 2024-01-03 DIAGNOSIS — M25572 Pain in left ankle and joints of left foot: Secondary | ICD-10-CM

## 2024-01-04 NOTE — Telephone Encounter (Signed)
 Requested Prescriptions  Pending Prescriptions Disp Refills   traZODone (DESYREL) 100 MG tablet [Pharmacy Med Name: TRAZODONE 100 MG TABLET] 135 tablet 0    Sig: TAKE 1 TO 1&1/2 TABLETS (100-150 MG TOTAL) BY MOUTH AT BEDTIME AS NEEDED FOR SLEEPSTRENGTH: 100 MG     Psychiatry: Antidepressants - Serotonin Modulator Failed - 01/04/2024  1:57 PM      Failed - Valid encounter within last 6 months    Recent Outpatient Visits   None     Future Appointments             In 1 month Johnson, Megan P, DO [place] [facility]            Passed - Completed PHQ-2 or PHQ-9 in the last 360 days       cyclobenzaprine (FLEXERIL) 5 MG tablet [Pharmacy Med Name: CYCLOBENZAPRINE 5 MG TABLET] 30 tablet 0    Sig: TAKE 1 TABLET BY MOUTH EVERYDAY AT BEDTIME     Not Delegated - Analgesics:  Muscle Relaxants Failed - 01/04/2024  1:57 PM      Failed - This refill cannot be delegated      Failed - Valid encounter within last 6 months    Recent Outpatient Visits   None     Future Appointments             In 1 month Johnson, Oralia Rud, DO [facility]

## 2024-01-04 NOTE — Telephone Encounter (Signed)
 Requested medication (s) are due for refill today: yes  Requested medication (s) are on the active medication list: yes  Last refill:  12/08/23  Future visit scheduled: yes  Notes to clinic:  Unable to refill per protocol, cannot delegate.      Requested Prescriptions  Pending Prescriptions Disp Refills   cyclobenzaprine (FLEXERIL) 5 MG tablet [Pharmacy Med Name: CYCLOBENZAPRINE 5 MG TABLET] 30 tablet 0    Sig: TAKE 1 TABLET BY MOUTH EVERYDAY AT BEDTIME     Not Delegated - Analgesics:  Muscle Relaxants Failed - 01/04/2024  1:58 PM      Failed - This refill cannot be delegated      Failed - Valid encounter within last 6 months    Recent Outpatient Visits   None     Future Appointments             In 1 month Johnson, Megan P, DO [place] [facility]            Signed Prescriptions Disp Refills   traZODone (DESYREL) 100 MG tablet 135 tablet 0    Sig: TAKE 1 TO 1&1/2 TABLETS (100-150 MG TOTAL) BY MOUTH AT BEDTIME AS NEEDED FOR SLEEPSTRENGTH: 100 MG     Psychiatry: Antidepressants - Serotonin Modulator Failed - 01/04/2024  1:58 PM      Failed - Valid encounter within last 6 months    Recent Outpatient Visits   None     Future Appointments             In 1 month Johnson, Megan P, DO [place] [facility]            Passed - Completed PHQ-2 or PHQ-9 in the last 360 days

## 2024-01-11 ENCOUNTER — Telehealth: Payer: Self-pay | Admitting: Family Medicine

## 2024-01-11 ENCOUNTER — Other Ambulatory Visit: Payer: Self-pay | Admitting: Family Medicine

## 2024-01-11 NOTE — Telephone Encounter (Signed)
 Returned message to patient. Appointment is needed.

## 2024-01-11 NOTE — Telephone Encounter (Signed)
 Patient has an Updated Medical Clearance form and a form for the respirator fitting (masks). She will be emailing these over. Will the patient need an appointment for these to be completed? Please confirm and follow up with the patient. [phone].

## 2024-01-12 NOTE — Telephone Encounter (Signed)
 Unable to refill per protocol, Rx expired discontinued 08/30/23, dose change.  Requested Prescriptions  Pending Prescriptions Disp Refills   QUEtiapine (SEROQUEL) 50 MG tablet [Pharmacy Med Name: QUETIAPINE FUMARATE 50 MG TAB] 30 tablet 0    Sig: TAKE 1 TABLET BY MOUTH EVERYDAY AT BEDTIME     Not Delegated - Psychiatry:  Antipsychotics - Second Generation (Atypical) - quetiapine Failed - 01/12/2024  9:48 AM      Failed - This refill cannot be delegated      Failed - Last Heart Rate in normal range    Pulse Readings from Last 1 Encounters:  10/21/23 (!) 120         Failed - Valid encounter within last 6 months    Recent Outpatient Visits   None     Future Appointments             In 1 month Johnson, Megan P, DO [place] [facility]            Failed - Lipid Panel in normal range within the last 12 months    Cholesterol, Total  Date Value Ref Range Status  08/30/2023 174 100 - 199 mg/dL Final   Cholesterol Piccolo, Waived  Date Value Ref Range Status  11/12/2016 173 <200 mg/dL Final    Comment:                            Desirable                <200                         Borderline High      200- 239                         High                     >239    LDL Chol Calc (NIH)  Date Value Ref Range Status  08/30/2023 93 0 - 99 mg/dL Final   HDL  Date Value Ref Range Status  08/30/2023 57 >39 mg/dL Final   Triglycerides  Date Value Ref Range Status  08/30/2023 135 0 - 149 mg/dL Final   Triglycerides Piccolo,Waived  Date Value Ref Range Status  11/12/2016 210 (H) <150 mg/dL Final    Comment:                            Normal                   <150                         Borderline High     150 - 199                         High                200 - 499                         Very High                >499          Passed -  TSH in normal range and within 360 days    TSH  Date Value Ref Range Status  08/30/2023 1.140 0.450 - 4.500  uIU/mL Final         Passed - Completed PHQ-2 or PHQ-9 in the last 360 days      Passed - Last BP in normal range    BP Readings from Last 1 Encounters:  10/21/23 120/84         Passed - CBC within normal limits and completed in the last 12 months    WBC  Date Value Ref Range Status  08/30/2023 6.1 3.4 - 10.8 x10E3/uL Final  07/18/2020 4.6 4.0 - 10.5 K/uL Final   RBC  Date Value Ref Range Status  08/30/2023 3.54 (L) 3.77 - 5.28 x10E6/uL Final  07/18/2020 3.40 (L) 3.87 - 5.11 MIL/uL Final   Hemoglobin  Date Value Ref Range Status  08/30/2023 11.4 11.1 - 15.9 g/dL Final   Hematocrit  Date Value Ref Range Status  08/30/2023 35.8 34.0 - 46.6 % Final   MCHC  Date Value Ref Range Status  08/30/2023 31.8 31.5 - 35.7 g/dL Final  40/98/1191 47.8 30.0 - 36.0 g/dL Final   [facility]  Date Value Ref Range Status  08/30/2023 32.2 26.6 - 33.0 pg Final  07/18/2020 32.9 26.0 - 34.0 pg Final   MCV  Date Value Ref Range Status  08/30/2023 101 (H) 79 - 97 fL Final   No results found for: "PLTCOUNTKUC", "LABPLAT", "POCPLA" RDW  Date Value Ref Range Status  08/30/2023 11.8 11.7 - 15.4 % Final         Passed - CMP within normal limits and completed in the last 12 months    Albumin  Date Value Ref Range Status  08/30/2023 3.9 3.8 - 4.8 g/dL Final   Alkaline Phosphatase  Date Value Ref Range Status  08/30/2023 103 44 - 121 IU/L Final   ALT  Date Value Ref Range Status  08/30/2023 9 0 - 32 IU/L Final   ALT (SGPT) Piccolo, Waived  Date Value Ref Range Status  11/12/2016 15 10 - 47 U/L Final   AST  Date Value Ref Range Status  08/30/2023 15 0 - 40 IU/L Final   AST (SGOT) Piccolo, Waived  Date Value Ref Range Status  11/12/2016 19 11 - 38 U/L Final   BUN  Date Value Ref Range Status  08/30/2023 16 8 - 27 mg/dL Final   Calcium  Date Value Ref Range Status  08/30/2023 9.2 8.7 - 10.3 mg/dL Final   CO2  Date Value Ref Range Status  08/30/2023 23 20 - 29 mmol/L Final    Bicarbonate  Date Value Ref Range Status  04/07/2018 20.1 20.0 - 28.0 mmol/L Corrected   Creatinine, Ser  Date Value Ref Range Status  08/30/2023 0.97 0.57 - 1.00 mg/dL Final   Glucose  Date Value Ref Range Status  08/30/2023 87 70 - 99 mg/dL Final   Glucose, Bld  Date Value Ref Range Status  07/18/2020 81 70 - 99 mg/dL Final    Comment:    Glucose reference range applies only to samples taken after fasting for at least 8 hours.   Glucose-Capillary  Date Value Ref Range Status  04/14/2018 84 70 - 99 mg/dL Final   Potassium  Date Value Ref Range Status  08/30/2023 4.3 3.5 - 5.2 mmol/L Final   Sodium  Date Value Ref Range Status  08/30/2023 139 134 - 144 mmol/L Final   Bilirubin Total  Date Value Ref Range Status  08/30/2023 <0.2 0.0 - 1.2 mg/dL Final   Bilirubin, Direct  Date Value Ref Range Status  04/05/2018 0.2 0.0 - 0.2 mg/dL Final    Comment:    Please note change in reference range.   Indirect Bilirubin  Date Value Ref Range Status  04/05/2018 0.4 0.3 - 0.9 mg/dL Final    Comment:    Performed at [facility], [address] [place], [place] [address]   Protein, ur  Date Value Ref Range Status  04/05/2018 30 (A) NEGATIVE mg/dL Final   Protein,UA  Date Value Ref Range Status  07/30/2022 1+ (A) Negative/Trace Final   Total Protein  Date Value Ref Range Status  08/30/2023 6.6 6.0 - 8.5 g/dL Final   GFR calc Af Amer  Date Value Ref Range Status  11/24/2019 57 (L) >59 mL/min/1.73 Final   eGFR  Date Value Ref Range Status  08/30/2023 62 >59 mL/min/1.73 Final   GFR, Estimated  Date Value Ref Range Status  07/18/2020 >60 >60 mL/min Final    Comment:    (NOTE) Calculated using the CKD-EPI Creatinine Equation (2021)

## 2024-01-13 ENCOUNTER — Telehealth: Payer: Self-pay

## 2024-01-13 NOTE — Telephone Encounter (Signed)
 DiCopied from CRM [id number]. Topic: General - Other >> Jan 13, 2024  9:33 AM Marissa P wrote: Reason for CRM: Patient called in for paperwork submitted yesterday round 10am and she stated that she needs it by Friday for work if I can let them know, advised front desk and they stated they will do the best they can. Process for front desk as advised is 48-72 hours to complete paperwork and they will call patient when it is ready to be picked up. Advised patient of this as well.

## 2024-01-14 NOTE — Telephone Encounter (Signed)
 Patient is aware we have no received form. It will be processed to her provider however she is aware there is no guarantee that provider will not require an office visit for form or that it can be completed before end of business day. Patient is also aware that I will not be office this afternoon and that I will ask Dr. Lincoln Renshaw CMA to reach out to her with an update when able. She is agreeable with this plan.

## 2024-01-14 NOTE — Telephone Encounter (Signed)
 Patient is aware paperwork is completed and ready for pickup. She will come by Monday morning to pickup.

## 2024-01-18 NOTE — Telephone Encounter (Signed)
 error

## 2024-02-04 ENCOUNTER — Other Ambulatory Visit: Payer: Self-pay | Admitting: Family Medicine

## 2024-02-07 NOTE — Telephone Encounter (Signed)
 Requested medications are due for refill today.  150mg  is - 50mg   not on med list  Requested medications are on the active medications list.  150 is  50 is d/c'd  Last refill. 150mg  09/01/2023 #90  1 rf  Future visit scheduled.   yes  Notes to clinic.  Refill/ refusal not delegated.     Requested Prescriptions  Pending Prescriptions Disp Refills   QUEtiapine  Fumarate (SEROQUEL  XR) 150 MG 24 hr tablet [Pharmacy Med Name: QUETIAPINE  ER 150 MG TABLET] 90 tablet 1    Sig: TAKE 1 TABLET BY MOUTH AT BEDTIME.     There is no refill protocol information for this order     QUEtiapine  (SEROQUEL ) 50 MG tablet [Pharmacy Med Name: QUETIAPINE  FUMARATE 50 MG TAB] 30 tablet 0    Sig: TAKE 1 TABLET BY MOUTH EVERYDAY AT BEDTIME     There is no refill protocol information for this order

## 2024-02-09 ENCOUNTER — Other Ambulatory Visit: Payer: Self-pay | Admitting: Family Medicine

## 2024-02-09 ENCOUNTER — Telehealth: Payer: Self-pay | Admitting: Family Medicine

## 2024-02-09 DIAGNOSIS — M546 Pain in thoracic spine: Secondary | ICD-10-CM | POA: Diagnosis not present

## 2024-02-09 NOTE — Telephone Encounter (Unsigned)
 Copied from CRM [id number]. Topic: Clinical - Medication Refill >> Feb 09, 2024 10:20 AM Felizardo Hotter wrote: Medication: QUEtiapine  Fumarate (SEROQUEL  XR) 150 MG 24 hr tablet  Has the patient contacted their pharmacy? Yes (Agent: If no, request that the patient contact the pharmacy for the refill. If patient does not wish to contact the pharmacy document the reason why and proceed with request.) (Agent: If yes, when and what did the pharmacy advise?)  This is the patient's preferred pharmacy:  [id number] - [place], [place] - [address] [place] [address] Phone: [phone] Fax: [phone]  Is this the correct pharmacy for this prescription? Yes If no, delete pharmacy and type the correct one.   Has the prescription been filled recently? Yes  Is the patient out of the medication? Yes  Has the patient been seen for an appointment in the last year OR does the patient have an upcoming appointment? Yes  Can we respond through MyChart? Yes  Agent: Please be advised that Rx refills may take up to 3 business days. We ask that you follow-up with your pharmacy.

## 2024-02-10 NOTE — Telephone Encounter (Signed)
 Requested medication (s) are due for refill today: yes  Requested medication (s) are on the active medication list: yes  Last refill:  09/06/23  Future visit scheduled: yes  Notes to clinic:    Pharmacy comment: Alternative Requested:THIS MANUFACTURER IS ON BACKORDER. PLEASE SEND NEW RX WITH OK TO CHANGE TO ANOTHER MANUFACTURER.       Requested Prescriptions  Pending Prescriptions Disp Refills   LEVO-T  25 MCG tablet [Pharmacy Med Name: LEVO-T  25 MCG TABLET]  0     Endocrinology:  Hypothyroid Agents Failed - 02/10/2024  3:39 PM      Failed - Valid encounter within last 12 months    Recent Outpatient Visits   None     Future Appointments             In 2 weeks Lincoln Renshaw, Megan P, DO [place] [facility]            Passed - TSH in normal range and within 360 days    TSH  Date Value Ref Range Status  08/30/2023 1.140 0.450 - 4.500 uIU/mL Final

## 2024-02-15 NOTE — Telephone Encounter (Unsigned)
 Copied from CRM [id number]. Topic: Clinical - Medication Refill >> Feb 15, 2024  9:00 AM Tiffany S wrote: Medication: QUEtiapine  Fumarate (SEROQUEL  XR) 150 MG 24 hr tablet [[id number]]  Has the patient contacted their pharmacy? Yes (Agent: If no, request that the patient contact the pharmacy for the refill. If patient does not wish to contact the pharmacy document the reason why and proceed with request.) (Agent: If yes, when and what did the pharmacy advise?)  This is the patient's preferred pharmacy:  [id number] - [place], [place] - [address] [place] [address] Phone: [phone] Fax: [phone]   Is this the correct pharmacy for this prescription? Yes If no, delete pharmacy and type the correct one.   Has the prescription been filled recently? Yes  Is the patient out of the medication? Yes  Has the patient been seen for an appointment in the last year OR does the patient have an upcoming appointment? Yes  Can we respond through MyChart? Yes  Agent: Please be advised that Rx refills may take up to 3 business days. We ask that you follow-up with your pharmacy.

## 2024-02-16 ENCOUNTER — Other Ambulatory Visit: Payer: Self-pay | Admitting: Family Medicine

## 2024-02-18 NOTE — Telephone Encounter (Signed)
 Too soon for refill.  Requested Prescriptions  Pending Prescriptions Disp Refills   ondansetron  (ZOFRAN ) 4 MG tablet [Pharmacy Med Name: ONDANSETRON  HCL 4 MG TABLET] 30 tablet 3    Sig: DISSOLVE 1 TABLET IN MOUTH AS NEEDED FOR NAUSEA UP TO 2 TIMES A DAY     Not Delegated - Gastroenterology: Antiemetics - ondansetron  Failed - 02/18/2024  8:59 AM      Failed - This refill cannot be delegated      Failed - Valid encounter within last 6 months    Recent Outpatient Visits   None     Future Appointments             In 1 week Johnson, Megan P, DO [place] [facility]            Passed - AST in normal range and within 360 days    AST  Date Value Ref Range Status  08/30/2023 15 0 - 40 IU/L Final   AST (SGOT) Piccolo, Waived  Date Value Ref Range Status  11/12/2016 19 11 - 38 U/L Final         Passed - ALT in normal range and within 360 days    ALT  Date Value Ref Range Status  08/30/2023 9 0 - 32 IU/L Final   ALT (SGPT) Piccolo, Waived  Date Value Ref Range Status  11/12/2016 15 10 - 47 U/L Final          levothyroxine  (SYNTHROID ) 25 MCG tablet [Pharmacy Med Name: LEVOTHYROXINE  25 MCG TABLET] 90 tablet 1    Sig: TAKE 1 TABLET BY MOUTH DAILY BEFORE BREAKFAST.     Endocrinology:  Hypothyroid Agents Failed - 02/18/2024  8:59 AM      Failed - Valid encounter within last 12 months    Recent Outpatient Visits   None     Future Appointments             In 1 week Johnson, Megan P, DO [place] [facility]            Passed - TSH in normal range and within 360 days    TSH  Date Value Ref Range Status  08/30/2023 1.140 0.450 - 4.500 uIU/mL Final          traZODone  (DESYREL ) 100 MG tablet [Pharmacy Med Name: TRAZODONE  100 MG TABLET] 135 tablet 0    Sig: TAKE 1 TO 1&1/2 TABLETS (100-150 MG TOTAL) BY MOUTH AT BEDTIME AS NEEDED FOR SLEEPSTRENGTH: 100 MG     Psychiatry: Antidepressants - Serotonin Modulator Failed - 02/18/2024   8:59 AM      Failed - Valid encounter within last 6 months    Recent Outpatient Visits   None     Future Appointments             In 1 week Solomon Dupre, DO [place] [facility]            Passed - Completed PHQ-2 or PHQ-9 in the last 360 days

## 2024-02-18 NOTE — Telephone Encounter (Signed)
 Requested medication (s) are due for refill today: routing for review  Requested medication (s) are on the active medication list: no  Last refill:  11/23/23  Future visit scheduled: yes  Notes to clinic:  Unable to refill per protocol, cannot delegate.      Requested Prescriptions  Pending Prescriptions Disp Refills   ondansetron  (ZOFRAN ) 4 MG tablet [Pharmacy Med Name: ONDANSETRON  HCL 4 MG TABLET] 30 tablet 3    Sig: DISSOLVE 1 TABLET IN MOUTH AS NEEDED FOR NAUSEA UP TO 2 TIMES A DAY     Not Delegated - Gastroenterology: Antiemetics - ondansetron  Failed - 02/18/2024  9:00 AM      Failed - This refill cannot be delegated      Failed - Valid encounter within last 6 months    Recent Outpatient Visits   None     Future Appointments             In 1 week Johnson, Megan P, DO [place] [facility]            Passed - AST in normal range and within 360 days    AST  Date Value Ref Range Status  08/30/2023 15 0 - 40 IU/L Final   AST (SGOT) Piccolo, Waived  Date Value Ref Range Status  11/12/2016 19 11 - 38 U/L Final         Passed - ALT in normal range and within 360 days    ALT  Date Value Ref Range Status  08/30/2023 9 0 - 32 IU/L Final   ALT (SGPT) Piccolo, Waived  Date Value Ref Range Status  11/12/2016 15 10 - 47 U/L Final         Refused Prescriptions Disp Refills   levothyroxine  (SYNTHROID ) 25 MCG tablet [Pharmacy Med Name: LEVOTHYROXINE  25 MCG TABLET] 90 tablet 1    Sig: TAKE 1 TABLET BY MOUTH DAILY BEFORE BREAKFAST.     Endocrinology:  Hypothyroid Agents Failed - 02/18/2024  9:00 AM      Failed - Valid encounter within last 12 months    Recent Outpatient Visits   None     Future Appointments             In 1 week Johnson, Megan P, DO [place] [facility]            Passed - TSH in normal range and within 360 days    TSH  Date Value Ref Range Status  08/30/2023 1.140 0.450 - 4.500 uIU/mL Final           traZODone  (DESYREL ) 100 MG tablet [Pharmacy Med Name: TRAZODONE  100 MG TABLET] 135 tablet 0    Sig: TAKE 1 TO 1&1/2 TABLETS (100-150 MG TOTAL) BY MOUTH AT BEDTIME AS NEEDED FOR SLEEPSTRENGTH: 100 MG     Psychiatry: Antidepressants - Serotonin Modulator Failed - 02/18/2024  9:00 AM      Failed - Valid encounter within last 6 months    Recent Outpatient Visits   None     Future Appointments             In 1 week Solomon Dupre, DO  [facility]            Passed - Completed PHQ-2 or PHQ-9 in the last 360 days

## 2024-02-21 ENCOUNTER — Other Ambulatory Visit: Payer: Self-pay | Admitting: Family Medicine

## 2024-02-24 NOTE — Telephone Encounter (Signed)
 Requested medication (s) are due for refill today: no  Requested medication (s) are on the active medication list: yes  Last refill:  02/18/24 #30 3 RF  Future visit scheduled: yes  Notes to clinic:  med not delegated to NT to RF   Requested Prescriptions  Pending Prescriptions Disp Refills   ondansetron  (ZOFRAN ) 4 MG tablet [Pharmacy Med Name: ONDANSETRON  HCL 4 MG TABLET] 30 tablet 3    Sig: DISSOLVE 1 TABLET IN MOUTH AS NEEDED FOR NAUSEA UP TO 2 TIMES A DAY     Not Delegated - Gastroenterology: Antiemetics - ondansetron  Failed - 02/24/2024 11:54 AM      Failed - This refill cannot be delegated      Failed - Valid encounter within last 6 months    Recent Outpatient Visits   None     Future Appointments             In 4 days Johnson, Megan P, DO [place] [facility]            Passed - AST in normal range and within 360 days    AST  Date Value Ref Range Status  08/30/2023 15 0 - 40 IU/L Final   AST (SGOT) Piccolo, Waived  Date Value Ref Range Status  11/12/2016 19 11 - 38 U/L Final         Passed - ALT in normal range and within 360 days    ALT  Date Value Ref Range Status  08/30/2023 9 0 - 32 IU/L Final   ALT (SGPT) Piccolo, Waived  Date Value Ref Range Status  11/12/2016 15 10 - 47 U/L Final

## 2024-02-28 ENCOUNTER — Ambulatory Visit: Payer: Self-pay | Admitting: Family Medicine

## 2024-02-28 ENCOUNTER — Other Ambulatory Visit: Payer: Self-pay | Admitting: Family Medicine

## 2024-02-29 ENCOUNTER — Telehealth: Payer: Self-pay | Admitting: Family Medicine

## 2024-02-29 NOTE — Telephone Encounter (Signed)
 Unable to refill per protocol, Rx expired. Discontinued.  Requested Prescriptions  Pending Prescriptions Disp Refills   QUEtiapine  (SEROQUEL ) 50 MG tablet [Pharmacy Med Name: QUETIAPINE  FUMARATE 50 MG TAB] 30 tablet 0    Sig: TAKE 1 TABLET BY MOUTH EVERYDAY AT BEDTIME     Not Delegated - Psychiatry:  Antipsychotics - Second Generation (Atypical) - quetiapine  Failed - 02/29/2024  4:30 PM      Failed - This refill cannot be delegated      Failed - Last Heart Rate in normal range    Pulse Readings from Last 1 Encounters:  10/21/23 (!) 120         Failed - Valid encounter within last 6 months    Recent Outpatient Visits   None            Failed - Lipid Panel in normal range within the last 12 months    Cholesterol, Total  Date Value Ref Range Status  08/30/2023 174 100 - 199 mg/dL Final   Cholesterol Piccolo, Waived  Date Value Ref Range Status  11/12/2016 173 <200 mg/dL Final    Comment:                            Desirable                <200                         Borderline High      200- 239                         High                     >239    LDL Chol Calc (NIH)  Date Value Ref Range Status  08/30/2023 93 0 - 99 mg/dL Final   HDL  Date Value Ref Range Status  08/30/2023 57 >39 mg/dL Final   Triglycerides  Date Value Ref Range Status  08/30/2023 135 0 - 149 mg/dL Final   Triglycerides Piccolo,Waived  Date Value Ref Range Status  11/12/2016 210 (H) <150 mg/dL Final    Comment:                            Normal                   <150                         Borderline High     150 - 199                         High                200 - 499                         Very High                >499          Passed - TSH in normal range and within 360 days    TSH  Date Value Ref Range Status  08/30/2023 1.140 0.450 - 4.500 uIU/mL Final  Passed - Completed PHQ-2 or PHQ-9 in the last 360 days      Passed - Last BP in normal range    BP Readings  from Last 1 Encounters:  10/21/23 120/84         Passed - CBC within normal limits and completed in the last 12 months    WBC  Date Value Ref Range Status  08/30/2023 6.1 3.4 - 10.8 x10E3/uL Final  07/18/2020 4.6 4.0 - 10.5 K/uL Final   RBC  Date Value Ref Range Status  08/30/2023 3.54 (L) 3.77 - 5.28 x10E6/uL Final  07/18/2020 3.40 (L) 3.87 - 5.11 MIL/uL Final   Hemoglobin  Date Value Ref Range Status  08/30/2023 11.4 11.1 - 15.9 g/dL Final   Hematocrit  Date Value Ref Range Status  08/30/2023 35.8 34.0 - 46.6 % Final   MCHC  Date Value Ref Range Status  08/30/2023 31.8 31.5 - 35.7 g/dL Final  16/06/9603 54.0 30.0 - 36.0 g/dL Final   [facility]  Date Value Ref Range Status  08/30/2023 32.2 26.6 - 33.0 pg Final  07/18/2020 32.9 26.0 - 34.0 pg Final   MCV  Date Value Ref Range Status  08/30/2023 101 (H) 79 - 97 fL Final   No results found for: "PLTCOUNTKUC", "LABPLAT", "POCPLA" RDW  Date Value Ref Range Status  08/30/2023 11.8 11.7 - 15.4 % Final         Passed - CMP within normal limits and completed in the last 12 months    Albumin   Date Value Ref Range Status  08/30/2023 3.9 3.8 - 4.8 g/dL Final   Alkaline Phosphatase  Date Value Ref Range Status  08/30/2023 103 44 - 121 IU/L Final   ALT  Date Value Ref Range Status  08/30/2023 9 0 - 32 IU/L Final   ALT (SGPT) Piccolo, Waived  Date Value Ref Range Status  11/12/2016 15 10 - 47 U/L Final   AST  Date Value Ref Range Status  08/30/2023 15 0 - 40 IU/L Final   AST (SGOT) Piccolo, Waived  Date Value Ref Range Status  11/12/2016 19 11 - 38 U/L Final   BUN  Date Value Ref Range Status  08/30/2023 16 8 - 27 mg/dL Final   Calcium   Date Value Ref Range Status  08/30/2023 9.2 8.7 - 10.3 mg/dL Final   CO2  Date Value Ref Range Status  08/30/2023 23 20 - 29 mmol/L Final   Bicarbonate  Date Value Ref Range Status  04/07/2018 20.1 20.0 - 28.0 mmol/L Corrected   Creatinine, Ser  Date Value Ref Range  Status  08/30/2023 0.97 0.57 - 1.00 mg/dL Final   Glucose  Date Value Ref Range Status  08/30/2023 87 70 - 99 mg/dL Final   Glucose, Bld  Date Value Ref Range Status  07/18/2020 81 70 - 99 mg/dL Final    Comment:    Glucose reference range applies only to samples taken after fasting for at least 8 hours.   Glucose-Capillary  Date Value Ref Range Status  04/14/2018 84 70 - 99 mg/dL Final   Potassium  Date Value Ref Range Status  08/30/2023 4.3 3.5 - 5.2 mmol/L Final   Sodium  Date Value Ref Range Status  08/30/2023 139 134 - 144 mmol/L Final   Bilirubin Total  Date Value Ref Range Status  08/30/2023 <0.2 0.0 - 1.2 mg/dL Final   Bilirubin, Direct  Date Value Ref Range Status  04/05/2018 0.2 0.0 - 0.2 mg/dL Final  Comment:    Please note change in reference range.   Indirect Bilirubin  Date Value Ref Range Status  04/05/2018 0.4 0.3 - 0.9 mg/dL Final    Comment:    Performed at [facility], [address] [place], [place] [address]   Protein, ur  Date Value Ref Range Status  04/05/2018 30 (A) NEGATIVE mg/dL Final   Protein,UA  Date Value Ref Range Status  07/30/2022 1+ (A) Negative/Trace Final   Total Protein  Date Value Ref Range Status  08/30/2023 6.6 6.0 - 8.5 g/dL Final   GFR calc Af Amer  Date Value Ref Range Status  11/24/2019 57 (L) >59 mL/min/1.73 Final   eGFR  Date Value Ref Range Status  08/30/2023 62 >59 mL/min/1.73 Final   GFR, Estimated  Date Value Ref Range Status  07/18/2020 >60 >60 mL/min Final    Comment:    (NOTE) Calculated using the CKD-EPI Creatinine Equation (2021)           QUEtiapine  Fumarate (SEROQUEL  XR) 150 MG 24 hr tablet [Pharmacy Med Name: QUETIAPINE  ER 150 MG TABLET] 90 tablet 1    Sig: TAKE 1 TABLET BY MOUTH AT BEDTIME.     Not Delegated - Psychiatry:  Antipsychotics - Second Generation (Atypical) - quetiapine  Failed - 02/29/2024  4:30 PM      Failed - This refill cannot be delegated      Failed -  Last Heart Rate in normal range    Pulse Readings from Last 1 Encounters:  10/21/23 (!) 120         Failed - Valid encounter within last 6 months    Recent Outpatient Visits   None            Failed - Lipid Panel in normal range within the last 12 months    Cholesterol, Total  Date Value Ref Range Status  08/30/2023 174 100 - 199 mg/dL Final   Cholesterol Piccolo, Waived  Date Value Ref Range Status  11/12/2016 173 <200 mg/dL Final    Comment:                            Desirable                <200                         Borderline High      200- 239                         High                     >239    LDL Chol Calc (NIH)  Date Value Ref Range Status  08/30/2023 93 0 - 99 mg/dL Final   HDL  Date Value Ref Range Status  08/30/2023 57 >39 mg/dL Final   Triglycerides  Date Value Ref Range Status  08/30/2023 135 0 - 149 mg/dL Final   Triglycerides Piccolo,Waived  Date Value Ref Range Status  11/12/2016 210 (H) <150 mg/dL Final    Comment:                            Normal                   <150  Borderline High     150 - 199                         High                200 - 499                         Very High                >499          Passed - TSH in normal range and within 360 days    TSH  Date Value Ref Range Status  08/30/2023 1.140 0.450 - 4.500 uIU/mL Final         Passed - Completed PHQ-2 or PHQ-9 in the last 360 days      Passed - Last BP in normal range    BP Readings from Last 1 Encounters:  10/21/23 120/84         Passed - CBC within normal limits and completed in the last 12 months    WBC  Date Value Ref Range Status  08/30/2023 6.1 3.4 - 10.8 x10E3/uL Final  07/18/2020 4.6 4.0 - 10.5 K/uL Final   RBC  Date Value Ref Range Status  08/30/2023 3.54 (L) 3.77 - 5.28 x10E6/uL Final  07/18/2020 3.40 (L) 3.87 - 5.11 MIL/uL Final   Hemoglobin  Date Value Ref Range Status  08/30/2023 11.4 11.1 - 15.9 g/dL  Final   Hematocrit  Date Value Ref Range Status  08/30/2023 35.8 34.0 - 46.6 % Final   MCHC  Date Value Ref Range Status  08/30/2023 31.8 31.5 - 35.7 g/dL Final  40/98/1191 47.8 30.0 - 36.0 g/dL Final   [facility]  Date Value Ref Range Status  08/30/2023 32.2 26.6 - 33.0 pg Final  07/18/2020 32.9 26.0 - 34.0 pg Final   MCV  Date Value Ref Range Status  08/30/2023 101 (H) 79 - 97 fL Final   No results found for: "PLTCOUNTKUC", "LABPLAT", "POCPLA" RDW  Date Value Ref Range Status  08/30/2023 11.8 11.7 - 15.4 % Final         Passed - CMP within normal limits and completed in the last 12 months    Albumin   Date Value Ref Range Status  08/30/2023 3.9 3.8 - 4.8 g/dL Final   Alkaline Phosphatase  Date Value Ref Range Status  08/30/2023 103 44 - 121 IU/L Final   ALT  Date Value Ref Range Status  08/30/2023 9 0 - 32 IU/L Final   ALT (SGPT) Piccolo, Waived  Date Value Ref Range Status  11/12/2016 15 10 - 47 U/L Final   AST  Date Value Ref Range Status  08/30/2023 15 0 - 40 IU/L Final   AST (SGOT) Piccolo, Waived  Date Value Ref Range Status  11/12/2016 19 11 - 38 U/L Final   BUN  Date Value Ref Range Status  08/30/2023 16 8 - 27 mg/dL Final   Calcium   Date Value Ref Range Status  08/30/2023 9.2 8.7 - 10.3 mg/dL Final   CO2  Date Value Ref Range Status  08/30/2023 23 20 - 29 mmol/L Final   Bicarbonate  Date Value Ref Range Status  04/07/2018 20.1 20.0 - 28.0 mmol/L Corrected   Creatinine, Ser  Date Value Ref Range Status  08/30/2023 0.97 0.57 - 1.00 mg/dL Final   Glucose  Date Value Ref Range  Status  08/30/2023 87 70 - 99 mg/dL Final   Glucose, Bld  Date Value Ref Range Status  07/18/2020 81 70 - 99 mg/dL Final    Comment:    Glucose reference range applies only to samples taken after fasting for at least 8 hours.   Glucose-Capillary  Date Value Ref Range Status  04/14/2018 84 70 - 99 mg/dL Final   Potassium  Date Value Ref Range Status   08/30/2023 4.3 3.5 - 5.2 mmol/L Final   Sodium  Date Value Ref Range Status  08/30/2023 139 134 - 144 mmol/L Final   Bilirubin Total  Date Value Ref Range Status  08/30/2023 <0.2 0.0 - 1.2 mg/dL Final   Bilirubin, Direct  Date Value Ref Range Status  04/05/2018 0.2 0.0 - 0.2 mg/dL Final    Comment:    Please note change in reference range.   Indirect Bilirubin  Date Value Ref Range Status  04/05/2018 0.4 0.3 - 0.9 mg/dL Final    Comment:    Performed at [facility], [address] [place], [place] [address]   Protein, ur  Date Value Ref Range Status  04/05/2018 30 (A) NEGATIVE mg/dL Final   Protein,UA  Date Value Ref Range Status  07/30/2022 1+ (A) Negative/Trace Final   Total Protein  Date Value Ref Range Status  08/30/2023 6.6 6.0 - 8.5 g/dL Final   GFR calc Af Amer  Date Value Ref Range Status  11/24/2019 57 (L) >59 mL/min/1.73 Final   eGFR  Date Value Ref Range Status  08/30/2023 62 >59 mL/min/1.73 Final   GFR, Estimated  Date Value Ref Range Status  07/18/2020 >60 >60 mL/min Final    Comment:    (NOTE) Calculated using the CKD-EPI Creatinine Equation (2021)

## 2024-02-29 NOTE — Telephone Encounter (Signed)
 Patient came in thinking her appointment was today, but it was yesterday. I rescheduled her for Thursday June 5th at 2pm. She would like to refill her QUEtiapine  Fumarate because she states that she is out of it.

## 2024-03-02 ENCOUNTER — Other Ambulatory Visit: Payer: Self-pay | Admitting: Family Medicine

## 2024-03-02 ENCOUNTER — Ambulatory Visit: Admitting: Family Medicine

## 2024-03-02 DIAGNOSIS — M25572 Pain in left ankle and joints of left foot: Secondary | ICD-10-CM

## 2024-03-02 NOTE — Telephone Encounter (Signed)
 Copied from CRM [id number]. Topic: Clinical - Medication Refill >> Mar 02, 2024  1:54 PM DeAngela L wrote: Medication: cyclobenzaprine  (FLEXERIL ) 5 MG tablet  Has the patient contacted their pharmacy? No  (Agent: If no, request that the patient contact the pharmacy for the refill. If patient does not wish to contact the pharmacy document the reason why and proceed with request.) (Agent: If yes, when and what did the pharmacy advise?)  This is the patient's preferred pharmacy:  [id number] - [place], [place] - [address] [place] [address] Phone: [phone] Fax: [phone]   Is this the correct pharmacy for this prescription? Yes  If no, delete pharmacy and type the correct one.   Has the prescription been filled recently? Yes   Is the patient out of the medication? Yes   Has the patient been seen for an appointment in the last year OR does the patient have an upcoming appointment? Yes   Can we respond through MyChart? Yes   Agent: Please be advised that Rx refills may take up to 3 business days. We ask that you follow-up with your pharmacy.

## 2024-03-03 ENCOUNTER — Other Ambulatory Visit: Payer: Self-pay

## 2024-03-03 ENCOUNTER — Telehealth: Payer: Self-pay

## 2024-03-03 NOTE — Telephone Encounter (Signed)
 Copied from CRM [id number]. Topic: Clinical - Medication Question >> Mar 02, 2024  1:48 PM DeAngela L wrote: Reason for CRM: patient would like to ask why her prescription for QUEtiapine  Fumarate (SEROQUEL  XR) 150 MG 24 hr tablet Has not been refilled afetr the request was sent on 02/15/24 Pt number [phone] (M)

## 2024-03-03 NOTE — Telephone Encounter (Signed)
 Copied from CRM [id number]. Topic: Clinical - Medication Question >> Mar 02, 2024  1:52 PM DeAngela L wrote: Reason for CRM: Patient would to ask if the provider would like to change her cyclobenzaprine  to tiZANidine (Zanaflex) 4 MG tablet this is up to the provider patient is just asking  Pt number [phone] (M)

## 2024-03-03 NOTE — Telephone Encounter (Signed)
 Requested medications are due for refill today.  no  Requested medications are on the active medications list.  yes  Last refill. 01/11/2024 #30 2 rf  Future visit scheduled.   yes  Notes to clinic.  Pt is requesting refill. Refill is not delegated.    Requested Prescriptions  Pending Prescriptions Disp Refills   cyclobenzaprine  (FLEXERIL ) 5 MG tablet 30 tablet 2    Sig: TAKE 1 TABLET BY MOUTH EVERYDAY AT BEDTIME     Not Delegated - Analgesics:  Muscle Relaxants Failed - 03/03/2024 12:46 PM      Failed - This refill cannot be delegated      Failed - Valid encounter within last 6 months    Recent Outpatient Visits   None

## 2024-03-06 MED ORDER — QUETIAPINE FUMARATE ER 150 MG PO TB24: 150.0000 mg | ORAL_TABLET | Freq: Every day | ORAL | 0 refills | Status: DC

## 2024-03-06 MED ORDER — CYCLOBENZAPRINE HCL 5 MG PO TABS: ORAL_TABLET | ORAL | 0 refills | Status: DC

## 2024-03-08 ENCOUNTER — Ambulatory Visit: Payer: Self-pay

## 2024-03-08 DIAGNOSIS — M25572 Pain in left ankle and joints of left foot: Secondary | ICD-10-CM

## 2024-03-08 NOTE — Telephone Encounter (Signed)
 See triage note from [facility]

## 2024-03-08 NOTE — Telephone Encounter (Signed)
 FYI Only or Action Required?: Action required by provider  Patient was last seen in primary care on 08/30/2023 by Terre Ferri P, DO. Called Nurse Triage reporting Advice Only & request medication for flexeril  and Seroquel .   Triage Disposition: will route medication refill request to PCP   Patient/caregiver understands and will follow disposition?:    Copied from CRM [id number]. Topic: Clinical - Red Word Triage >> Mar 08, 2024 12:12 PM Elle L wrote: Red Word that prompted transfer to Nurse Triage: The patient was calling to reschedule her appointment to after the 22nd. However, she states she is having severe back pain and went to Urgent Care previously and was prescribed muscle relaxers but has run out of them.   Reason for Disposition  Health Information question, no triage required and triager able to answer question  Answer Assessment - Initial Assessment Questions 1. REASON FOR CALL or QUESTION: What is your reason for calling today? or How can I best help you? or What question do you have that I can help answer?     Pt stated she called in today to reschedule an existing appointment with PCP due to a scheduling conflict.  Pt also informed she was having severe back pain which has been ongoing for some time now: pt went to urgent care and was evaluated and treated by a HCP and pt stated she is doing much better today.  Pt stated pain is tolerable.  Pt states she has ran out of 2 medications to include: flexeril  and Seroquel   and would like to request a refill for these medications.  Protocols used: Information Only Call - No Triage-A-AH

## 2024-03-08 NOTE — Telephone Encounter (Signed)
 Seroquel  and flexeril  were both refilled to pharmacy on 6/9 for 30 days. Will need follow up for additional refills

## 2024-03-14 ENCOUNTER — Ambulatory Visit: Payer: Self-pay

## 2024-03-14 NOTE — Telephone Encounter (Signed)
 FYI

## 2024-03-14 NOTE — Telephone Encounter (Signed)
 FYI Only or Action Required?: FYI only for provider  Patient was last seen in primary care on 08/30/2023 by Terre Ferri P, DO. Called Nurse Triage reporting Back Pain. Symptoms began about a month ago. Interventions attempted: OTC medications: Ibuprofen. Symptoms are: unchanged.  Triage Disposition: See PCP When Office is Open (Within 3 Days)Attempted, Declined by pt. See note.   Patient/caregiver understands and will follow disposition?: Yes      Copied from CRM [id number]. Topic: Clinical - Red Word Triage >> Mar 14, 2024  9:55 AM Elle L wrote: Red Word that prompted transfer to Nurse Triage: The patient was originally calling to change her acute visit to an annual physical. However, she advised me that she is currently experiencing worsening severe back pain. Reason for Disposition  [1] MODERATE back pain (e.g., interferes with normal activities) AND [2] present > 3 days  Answer Assessment - Initial Assessment Questions 1. ONSET: When did the pain begin?      ----1 month ago    2. LOCATION: Where does it hurt? (upper, mid or lower back)     -------------L side of her back     3. SEVERITY: How bad is the pain?  (e.g., Scale 1-10; mild, moderate, or severe)   - MILD (1-3): Doesn't interfere with normal activities.    - MODERATE (4-7): Interferes with normal activities or awakens from sleep.    - SEVERE (8-10): Excruciating pain, unable to do any normal activities.        ---------------------------8/10    4. PATTERN: Is the pain constant? (e.g., yes, no; constant, intermittent)      ------------------ Constant      5. RADIATION: Does the pain shoot into your legs or somewhere else?     ----------- Shoots up   6. CAUSE:  What do you think is causing the back pain?      ------------ Unsure    8. MEDICINES: What have you taken so far for the pain? (e.g., nothing, acetaminophen , NSAIDS)    ------------- Starting with a T   10. OTHER SYMPTOMS: Do you  have any other symptoms? (e.g., fever, abdomen pain, burning with urination, blood in urine)       -----------------Intermittent: L leg will go numb ( if she stands for too long)    Additional Infor:    Went to UC 1 month ago, for the same reason-- Thinks she pulled her back muscle:  Attempted to schedule patient with soonest available appt, pt declined as she will  be working everyday, up until the 23rd. Patient advised to go to an UC after work. Patient educated on pertinent s/s that would warrant emergent help/call 911/ ED. Patient verbalized understanding and agrees with plan No additional questions/concerns noted during the time of the call.  Protocols used: Back Pain-A-AH

## 2024-03-15 ENCOUNTER — Ambulatory Visit: Admitting: Family Medicine

## 2024-03-18 NOTE — Patient Instructions (Incomplete)

## 2024-03-20 ENCOUNTER — Ambulatory Visit: Admitting: Nurse Practitioner

## 2024-04-12 ENCOUNTER — Other Ambulatory Visit: Payer: Self-pay | Admitting: Family Medicine

## 2024-04-12 ENCOUNTER — Ambulatory Visit (INDEPENDENT_AMBULATORY_CARE_PROVIDER_SITE_OTHER): Admitting: Family Medicine

## 2024-04-12 ENCOUNTER — Encounter: Payer: Self-pay | Admitting: Family Medicine

## 2024-04-12 VITALS — BP 113/74 | HR 93 | Temp 98.4°F | Resp 15 | Ht 60.0 in | Wt 106.0 lb

## 2024-04-12 DIAGNOSIS — E785 Hyperlipidemia, unspecified: Secondary | ICD-10-CM

## 2024-04-12 DIAGNOSIS — E559 Vitamin D deficiency, unspecified: Secondary | ICD-10-CM | POA: Diagnosis not present

## 2024-04-12 DIAGNOSIS — E538 Deficiency of other specified B group vitamins: Secondary | ICD-10-CM

## 2024-04-12 DIAGNOSIS — F3342 Major depressive disorder, recurrent, in full remission: Secondary | ICD-10-CM

## 2024-04-12 DIAGNOSIS — M25572 Pain in left ankle and joints of left foot: Secondary | ICD-10-CM

## 2024-04-12 DIAGNOSIS — R21 Rash and other nonspecific skin eruption: Secondary | ICD-10-CM | POA: Diagnosis not present

## 2024-04-12 DIAGNOSIS — E039 Hypothyroidism, unspecified: Secondary | ICD-10-CM

## 2024-04-12 MED ORDER — GABAPENTIN 300 MG PO CAPS: ORAL_CAPSULE | ORAL | 1 refills | Status: DC

## 2024-04-12 MED ORDER — CYCLOBENZAPRINE HCL 5 MG PO TABS: ORAL_TABLET | ORAL | 6 refills | Status: AC

## 2024-04-12 MED ORDER — ROSUVASTATIN CALCIUM 10 MG PO TABS: 10.0000 mg | ORAL_TABLET | Freq: Every day | ORAL | 1 refills | Status: DC

## 2024-04-12 MED ORDER — TRIAMCINOLONE ACETONIDE 40 MG/ML IJ SUSP
40.0000 mg | Freq: Once | INTRAMUSCULAR | Status: AC
  Administered 2024-04-12: 40 mg via INTRAMUSCULAR

## 2024-04-12 MED ORDER — QUETIAPINE FUMARATE ER 200 MG PO TB24: 200.0000 mg | ORAL_TABLET | Freq: Every day | ORAL | 0 refills | Status: DC

## 2024-04-12 MED ORDER — ONDANSETRON HCL 4 MG PO TABS: ORAL_TABLET | ORAL | 6 refills | Status: AC

## 2024-04-12 MED ORDER — PANTOPRAZOLE SODIUM 40 MG PO TBEC: 40.0000 mg | DELAYED_RELEASE_TABLET | Freq: Two times a day (BID) | ORAL | 1 refills | Status: DC

## 2024-04-12 MED ORDER — TRAZODONE HCL 100 MG PO TABS: ORAL_TABLET | ORAL | 1 refills | Status: DC

## 2024-04-12 MED ORDER — HYDROXYZINE PAMOATE 25 MG PO CAPS: 25.0000 mg | ORAL_CAPSULE | Freq: Three times a day (TID) | ORAL | 1 refills | Status: DC | PRN

## 2024-04-12 NOTE — Assessment & Plan Note (Signed)
 Under good control on current regimen. Continue current regimen. Continue to monitor. Call with any concerns. Refills given. Labs drawn today.

## 2024-04-12 NOTE — Assessment & Plan Note (Signed)
 Rechecking labs today. Await results. Treat as needed.

## 2024-04-12 NOTE — Assessment & Plan Note (Signed)
 Not doing well. Will increase her seroquel  to 200mg  and recheck in 4 weeks. Call with any concerns.

## 2024-04-12 NOTE — Progress Notes (Signed)
 BP 113/74 (BP Location: Left Arm, Patient Position: Sitting, Cuff Size: Normal)   Pulse 93   Temp 98.4 F (36.9 C) (Oral)   Resp 15   Ht 5' (1.524 m)   Wt 106 lb (48.1 kg)   SpO2 98%   BMI 20.70 kg/m    Subjective:    Patient ID: Annette Miller, female    DOB: August 02, 1951, 73 y.o.   MRN: [id number]  HPI: Annette Miller is a 73 y.o. female  Chief Complaint  Patient presents with   Depression    Feeling worse than prior. House needs to repair and will need to sell it. Also not working right now and so that stresses her.    Insomnia    Does feel it is getting worse recently. She can relax but mind won't shut off.    Rash    On her back and now going down her arms. Wonders if stress related.    Mammogram    Scheduled with mobile bus for 07/21/2024   ANXIETY/DEPRESSION Duration: chronic Status:exacerbated Anxious mood: yes  Excessive worrying: yes Irritability: yes  Sweating: no Nausea: no Palpitations:no Hyperventilation: no Panic attacks: yes Agoraphobia: no  Obscessions/compulsions: no Depressed mood: yes    04/12/2024    9:46 AM 08/30/2023    1:42 PM 08/12/2023    9:12 AM 03/26/2023   10:45 AM 02/01/2023    3:44 PM  Depression screen PHQ 2/9  Decreased Interest 1 1 0 0 1  Down, Depressed, Hopeless 2 2 0 1 2  PHQ - 2 Score 3 3 0 1 3  Altered sleeping 3 3 0 3 3  Tired, decreased energy 3 3 0 0 3  Change in appetite 0 2 0 0 2  Feeling bad or failure about yourself  1 1 0 0 2  Trouble concentrating 1 0 0 0 1  Moving slowly or fidgety/restless 1 0 0 1 1  Suicidal thoughts 0 0 0 0 0  PHQ-9 Score 12 12 0 5 15  Difficult doing work/chores Extremely dIfficult  Not difficult at all Not difficult at all Not difficult at all   Anhedonia: no Weight changes: no Insomnia: yes hard to fall asleep  Hypersomnia: no Fatigue/loss of energy: no Feelings of worthlessness: no Feelings of guilt: no Impaired concentration/indecisiveness: no Suicidal ideations: no  Crying  spells: no Recent Stressors/Life Changes: yes   Relationship problems: no   Family stress: no     Financial stress: yes    Job stress: yes    Recent death/loss: no  HYPERLIPIDEMIA Hyperlipidemia status: excellent compliance Satisfied with current treatment?  yes Side effects:  no Medication compliance: excellent compliance Past cholesterol meds: crestor  Supplements: none Aspirin:  no The 10-year ASCVD risk score (Arnett DK, et al., 2019) is: 15.1%   Values used to calculate the score:     Age: 73 years     Clincally relevant sex: Female     Is Non-Hispanic African American: No     Diabetic: No     Tobacco smoker: Yes     Systolic Blood Pressure: 113 mmHg     Is BP treated: No     HDL Cholesterol: 57 mg/dL     Total Cholesterol: 174 mg/dL Chest pain:  no Coronary artery disease:  no  HYPOTHYROIDISM Thyroid  control status:controlled Satisfied with current treatment? yes Medication side effects: no Medication compliance: excellent compliance Recent dose adjustment:no Fatigue: no Cold intolerance: no Heat intolerance: no Weight gain: no Weight  loss: no Constipation: no Diarrhea/loose stools: no Palpitations: no Lower extremity edema: no Anxiety/depressed mood: yes   Relevant past medical, surgical, family and social history reviewed and updated as indicated. Interim medical history since our last visit reviewed. Allergies and medications reviewed and updated.  Review of Systems  Constitutional: Negative.   Respiratory: Negative.    Cardiovascular: Negative.   Musculoskeletal: Negative.   Skin:  Positive for rash. Negative for color change, pallor and wound.  Psychiatric/Behavioral:  Positive for behavioral problems, dysphoric mood and sleep disturbance. Negative for agitation, confusion, decreased concentration, hallucinations, self-injury and suicidal ideas. The patient is nervous/anxious. The patient is not hyperactive.     Per HPI unless specifically  indicated above     Objective:    BP 113/74 (BP Location: Left Arm, Patient Position: Sitting, Cuff Size: Normal)   Pulse 93   Temp 98.4 F (36.9 C) (Oral)   Resp 15   Ht 5' (1.524 m)   Wt 106 lb (48.1 kg)   SpO2 98%   BMI 20.70 kg/m   Wt Readings from Last 3 Encounters:  04/12/24 106 lb (48.1 kg)  10/21/23 109 lb (49.4 kg)  08/30/23 116 lb 3.2 oz (52.7 kg)    Physical Exam Vitals and nursing note reviewed.  Constitutional:      General: She is not in acute distress.    Appearance: Normal appearance. She is normal weight. She is not ill-appearing, toxic-appearing or diaphoretic.  HENT:     Head: Normocephalic and atraumatic.     Right Ear: External ear normal.     Left Ear: External ear normal.     Nose: Nose normal.     Mouth/Throat:     Mouth: Mucous membranes are moist.     Pharynx: Oropharynx is clear.  Eyes:     General: No scleral icterus.       Right eye: No discharge.        Left eye: No discharge.     Extraocular Movements: Extraocular movements intact.     Conjunctiva/sclera: Conjunctivae normal.     Pupils: Pupils are equal, round, and reactive to light.  Cardiovascular:     Rate and Rhythm: Normal rate and regular rhythm.     Pulses: Normal pulses.     Heart sounds: Normal heart sounds. No murmur heard.    No friction rub. No gallop.  Pulmonary:     Effort: Pulmonary effort is normal. No respiratory distress.     Breath sounds: Normal breath sounds. No stridor. No wheezing, rhonchi or rales.  Chest:     Chest wall: No tenderness.  Musculoskeletal:        General: Normal range of motion.     Cervical back: Normal range of motion and neck supple.  Skin:    General: Skin is warm and dry.     Capillary Refill: Capillary refill takes less than 2 seconds.     Coloration: Skin is not jaundiced or pale.     Findings: No bruising, erythema, lesion or rash.  Neurological:     General: No focal deficit present.     Mental Status: She is alert and oriented  to person, place, and time. Mental status is at baseline.  Psychiatric:        Mood and Affect: Mood normal.        Behavior: Behavior normal.        Thought Content: Thought content normal.        Judgment: Judgment normal.  Results for orders placed or performed in visit on 08/30/23  CBC with Differential/Platelet   Collection Time: 08/30/23  1:45 PM  Result Value Ref Range   WBC 6.1 3.4 - 10.8 x10E3/uL   RBC 3.54 (L) 3.77 - 5.28 x10E6/uL   Hemoglobin 11.4 11.1 - 15.9 g/dL   Hematocrit 64.1 65.9 - 46.6 %   MCV 101 (H) 79 - 97 fL   MCH 32.2 26.6 - 33.0 pg   MCHC 31.8 31.5 - 35.7 g/dL   RDW 88.1 88.2 - 84.5 %   Platelets 318 150 - 450 x10E3/uL   Neutrophils 73 Not Estab. %   Lymphs 19 Not Estab. %   Monocytes 6 Not Estab. %   Eos 2 Not Estab. %   Basos 0 Not Estab. %   Neutrophils Absolute 4.4 1.4 - 7.0 x10E3/uL   Lymphocytes Absolute 1.2 0.7 - 3.1 x10E3/uL   Monocytes Absolute 0.4 0.1 - 0.9 x10E3/uL   EOS (ABSOLUTE) 0.1 0.0 - 0.4 x10E3/uL   Basophils Absolute 0.0 0.0 - 0.2 x10E3/uL   Immature Granulocytes 0 Not Estab. %   Immature Grans (Abs) 0.0 0.0 - 0.1 x10E3/uL  Comprehensive metabolic panel   Collection Time: 08/30/23  1:45 PM  Result Value Ref Range   Glucose 87 70 - 99 mg/dL   BUN 16 8 - 27 mg/dL   Creatinine, Ser 9.02 0.57 - 1.00 mg/dL   eGFR 62 >40 fO/fpw/8.26   BUN/Creatinine Ratio 16 12 - 28   Sodium 139 134 - 144 mmol/L   Potassium 4.3 3.5 - 5.2 mmol/L   Chloride 101 96 - 106 mmol/L   CO2 23 20 - 29 mmol/L   Calcium  9.2 8.7 - 10.3 mg/dL   Total Protein 6.6 6.0 - 8.5 g/dL   Albumin  3.9 3.8 - 4.8 g/dL   Globulin, Total 2.7 1.5 - 4.5 g/dL   Bilirubin Total <9.7 0.0 - 1.2 mg/dL   Alkaline Phosphatase 103 44 - 121 IU/L   AST 15 0 - 40 IU/L   ALT 9 0 - 32 IU/L  Lipid Panel w/o Chol/HDL Ratio   Collection Time: 08/30/23  1:45 PM  Result Value Ref Range   Cholesterol, Total 174 100 - 199 mg/dL   Triglycerides 864 0 - 149 mg/dL   HDL 57 >60 mg/dL    VLDL Cholesterol Cal 24 5 - 40 mg/dL   LDL Chol Calc (NIH) 93 0 - 99 mg/dL  TSH   Collection Time: 08/30/23  1:45 PM  Result Value Ref Range   TSH 1.140 0.450 - 4.500 uIU/mL  VITAMIN D  25 Hydroxy (Vit-D Deficiency, Fractures)   Collection Time: 08/30/23  1:45 PM  Result Value Ref Range   Vit D, 25-Hydroxy 42.7 30.0 - 100.0 ng/mL  B12   Collection Time: 08/30/23  1:45 PM  Result Value Ref Range   Vitamin B-12 486 232 - 1,245 pg/mL      Assessment & Plan:   Problem List Items Addressed This Visit       Endocrine   Hypothyroidism   Rechecking labs today. Await results. Treat as needed.       Relevant Orders   CBC with Differential/Platelet   TSH     Other   Hyperlipidemia - Primary   Under good control on current regimen. Continue current regimen. Continue to monitor. Call with any concerns. Refills given. Labs drawn today.        Relevant Medications   rosuvastatin  (CRESTOR ) 10 MG tablet  Other Relevant Orders   CBC with Differential/Platelet   Lipid Panel w/o Chol/HDL Ratio   Comprehensive metabolic panel with GFR   Depression   Not doing well. Will increase her seroquel  to 200mg  and recheck in 4 weeks. Call with any concerns.       Relevant Medications   traZODone  (DESYREL ) 100 MG tablet   hydrOXYzine  (VISTARIL ) 25 MG capsule   Other Relevant Orders   CBC with Differential/Platelet   B12 deficiency   Under good control on current regimen. Continue current regimen. Continue to monitor. Call with any concerns. Refills given. Labs drawn today.       Relevant Orders   CBC with Differential/Platelet   B12   Vitamin D  deficiency   Under good control on current regimen. Continue current regimen. Continue to monitor. Call with any concerns. Refills given. Labs drawn today.       Relevant Orders   CBC with Differential/Platelet   VITAMIN D  25 Hydroxy (Vit-D Deficiency, Fractures)   RESOLVED: Acute left ankle pain   Relevant Medications   cyclobenzaprine   (FLEXERIL ) 5 MG tablet   Other Visit Diagnoses       Rash       Will treat with hydroxyzine  and triamcinalone shot. Call with any concerns. Continue to monitor.   Relevant Medications   triamcinolone  acetonide (KENALOG -40) injection 40 mg (Completed)        Follow up plan: Return in about 4 weeks (around 05/10/2024).

## 2024-04-13 LAB — CBC WITH DIFFERENTIAL/PLATELET
Basophils Absolute: 0 x10E3/uL (ref 0.0–0.2)
Basos: 0 %
EOS (ABSOLUTE): 0.1 x10E3/uL (ref 0.0–0.4)
Eos: 2 %
Hematocrit: 38.7 % (ref 34.0–46.6)
Hemoglobin: 12.7 g/dL (ref 11.1–15.9)
Immature Grans (Abs): 0 x10E3/uL (ref 0.0–0.1)
Immature Granulocytes: 0 %
Lymphocytes Absolute: 1.5 x10E3/uL (ref 0.7–3.1)
Lymphs: 22 %
MCH: 34.2 pg — ABNORMAL HIGH (ref 26.6–33.0)
MCHC: 32.8 g/dL (ref 31.5–35.7)
MCV: 104 fL — ABNORMAL HIGH (ref 79–97)
Monocytes Absolute: 0.3 x10E3/uL (ref 0.1–0.9)
Monocytes: 5 %
Neutrophils Absolute: 4.9 x10E3/uL (ref 1.4–7.0)
Neutrophils: 71 %
Platelets: 324 x10E3/uL (ref 150–450)
RBC: 3.71 x10E6/uL — ABNORMAL LOW (ref 3.77–5.28)
RDW: 11.9 % (ref 11.7–15.4)
WBC: 6.8 x10E3/uL (ref 3.4–10.8)

## 2024-04-13 LAB — TSH: TSH: 0.699 u[IU]/mL (ref 0.450–4.500)

## 2024-04-13 LAB — COMPREHENSIVE METABOLIC PANEL WITH GFR
ALT: 9 IU/L (ref 0–32)
AST: 18 IU/L (ref 0–40)
Albumin: 4.2 g/dL (ref 3.8–4.8)
Alkaline Phosphatase: 97 IU/L (ref 44–121)
BUN/Creatinine Ratio: 18 (ref 12–28)
BUN: 19 mg/dL (ref 8–27)
Bilirubin Total: <0.2 mg/dL (ref 0.0–1.2)
CO2: 22 mmol/L (ref 20–29)
Calcium: 9.4 mg/dL (ref 8.7–10.3)
Chloride: 104 mmol/L (ref 96–106)
Creatinine, Ser: 1.04 mg/dL — ABNORMAL HIGH (ref 0.57–1.00)
Globulin, Total: 2.6 g/dL (ref 1.5–4.5)
Glucose: 87 mg/dL (ref 70–99)
Potassium: 4.8 mmol/L (ref 3.5–5.2)
Sodium: 142 mmol/L (ref 134–144)
Total Protein: 6.8 g/dL (ref 6.0–8.5)
eGFR: 57 mL/min/1.73 — ABNORMAL LOW (ref >59)

## 2024-04-13 LAB — VITAMIN D 25 HYDROXY (VIT D DEFICIENCY, FRACTURES): Vit D, 25-Hydroxy: 45.5 ng/mL (ref 30.0–100.0)

## 2024-04-13 LAB — LIPID PANEL W/O CHOL/HDL RATIO
Cholesterol, Total: 167 mg/dL (ref 100–199)
HDL: 59 mg/dL (ref >39)
LDL Chol Calc (NIH): 90 mg/dL (ref 0–99)
Triglycerides: 102 mg/dL (ref 0–149)
VLDL Cholesterol Cal: 18 mg/dL (ref 5–40)

## 2024-04-13 LAB — VITAMIN B12: Vitamin B-12: 1019 pg/mL (ref 232–1245)

## 2024-04-13 NOTE — Telephone Encounter (Signed)
 Refilled 04/12/24, duplcate.  Requested Prescriptions  Pending Prescriptions Disp Refills   QUEtiapine  (SEROQUEL  XR) 200 MG 24 hr tablet [Pharmacy Med Name: QUETIAPINE  ER 200 MG TABLET] 90 tablet 1    Sig: TAKE 1 TABLET BY MOUTH AT BEDTIME.     Not Delegated - Psychiatry:  Antipsychotics - Second Generation (Atypical) - quetiapine  Failed - 04/13/2024  4:23 PM      Failed - This refill cannot be delegated      Failed - Lipid Panel in normal range within the last 12 months    Cholesterol, Total  Date Value Ref Range Status  04/12/2024 167 100 - 199 mg/dL Final   Cholesterol Piccolo, Waived  Date Value Ref Range Status  11/12/2016 173 <200 mg/dL Final    Comment:                            Desirable                <200                         Borderline High      200- 239                         High                     >239    LDL Chol Calc (NIH)  Date Value Ref Range Status  04/12/2024 90 0 - 99 mg/dL Final   HDL  Date Value Ref Range Status  04/12/2024 59 >39 mg/dL Final   Triglycerides  Date Value Ref Range Status  04/12/2024 102 0 - 149 mg/dL Final   Triglycerides Piccolo,Waived  Date Value Ref Range Status  11/12/2016 210 (H) <150 mg/dL Final    Comment:                            Normal                   <150                         Borderline High     150 - 199                         High                200 - 499                         Very High                >499          Failed - CBC within normal limits and completed in the last 12 months    WBC  Date Value Ref Range Status  04/12/2024 6.8 3.4 - 10.8 x10E3/uL Final  07/18/2020 4.6 4.0 - 10.5 K/uL Final   RBC  Date Value Ref Range Status  04/12/2024 3.71 (L) 3.77 - 5.28 x10E6/uL Final  07/18/2020 3.40 (L) 3.87 - 5.11 MIL/uL Final   Hemoglobin  Date Value Ref Range Status  04/12/2024 12.7 11.1 - 15.9 g/dL Final   Hematocrit  Date Value Ref Range Status  04/12/2024 38.7  34.0 - 46.6 % Final    MCHC  Date Value Ref Range Status  04/12/2024 32.8 31.5 - 35.7 g/dL Final  89/78/7978 67.5 30.0 - 36.0 g/dL Final   [facility]  Date Value Ref Range Status  04/12/2024 34.2 (H) 26.6 - 33.0 pg Final  07/18/2020 32.9 26.0 - 34.0 pg Final   MCV  Date Value Ref Range Status  04/12/2024 104 (H) 79 - 97 fL Final   No results found for: PLTCOUNTKUC, LABPLAT, POCPLA RDW  Date Value Ref Range Status  04/12/2024 11.9 11.7 - 15.4 % Final         Failed - CMP within normal limits and completed in the last 12 months    Albumin   Date Value Ref Range Status  04/12/2024 4.2 3.8 - 4.8 g/dL Final   Alkaline Phosphatase  Date Value Ref Range Status  04/12/2024 97 44 - 121 IU/L Final   ALT  Date Value Ref Range Status  04/12/2024 9 0 - 32 IU/L Final   ALT (SGPT) Piccolo, Waived  Date Value Ref Range Status  11/12/2016 15 10 - 47 U/L Final   AST  Date Value Ref Range Status  04/12/2024 18 0 - 40 IU/L Final   AST (SGOT) Piccolo, Waived  Date Value Ref Range Status  11/12/2016 19 11 - 38 U/L Final   BUN  Date Value Ref Range Status  04/12/2024 19 8 - 27 mg/dL Final   Calcium   Date Value Ref Range Status  04/12/2024 9.4 8.7 - 10.3 mg/dL Final   CO2  Date Value Ref Range Status  04/12/2024 22 20 - 29 mmol/L Final   Bicarbonate  Date Value Ref Range Status  04/07/2018 20.1 20.0 - 28.0 mmol/L Corrected   Creatinine, Ser  Date Value Ref Range Status  04/12/2024 1.04 (H) 0.57 - 1.00 mg/dL Final   Glucose  Date Value Ref Range Status  04/12/2024 87 70 - 99 mg/dL Final   Glucose, Bld  Date Value Ref Range Status  07/18/2020 81 70 - 99 mg/dL Final    Comment:    Glucose reference range applies only to samples taken after fasting for at least 8 hours.   Glucose-Capillary  Date Value Ref Range Status  04/14/2018 84 70 - 99 mg/dL Final   Potassium  Date Value Ref Range Status  04/12/2024 4.8 3.5 - 5.2 mmol/L Final   Sodium  Date Value Ref Range Status  04/12/2024  142 134 - 144 mmol/L Final   Bilirubin Total  Date Value Ref Range Status  04/12/2024 <0.2 0.0 - 1.2 mg/dL Final   Bilirubin, Direct  Date Value Ref Range Status  04/05/2018 0.2 0.0 - 0.2 mg/dL Final    Comment:    Please note change in reference range.   Indirect Bilirubin  Date Value Ref Range Status  04/05/2018 0.4 0.3 - 0.9 mg/dL Final    Comment:    Performed at [facility], [address] [place], [place] [address]   Protein, ur  Date Value Ref Range Status  04/05/2018 30 (A) NEGATIVE mg/dL Final   Protein,UA  Date Value Ref Range Status  07/30/2022 1+ (A) Negative/Trace Final   Total Protein  Date Value Ref Range Status  04/12/2024 6.8 6.0 - 8.5 g/dL Final   GFR calc Af Amer  Date Value Ref Range Status  11/24/2019 57 (L) >59 mL/min/1.73 Final   eGFR  Date Value Ref Range Status  04/12/2024 57 (L) >59 mL/min/1.73 Final   GFR,  Estimated  Date Value Ref Range Status  07/18/2020 >60 >60 mL/min Final    Comment:    (NOTE) Calculated using the CKD-EPI Creatinine Equation (2021)          Passed - TSH in normal range and within 360 days    TSH  Date Value Ref Range Status  04/12/2024 0.699 0.450 - 4.500 uIU/mL Final         Passed - Completed PHQ-2 or PHQ-9 in the last 360 days      Passed - Last BP in normal range    BP Readings from Last 1 Encounters:  04/12/24 113/74         Passed - Last Heart Rate in normal range    Pulse Readings from Last 1 Encounters:  04/12/24 93         Passed - Valid encounter within last 6 months    Recent Outpatient Visits           Yesterday Hyperlipidemia, unspecified hyperlipidemia type   [place] [facility] [place], [place], [place]

## 2024-04-14 ENCOUNTER — Other Ambulatory Visit: Payer: Self-pay

## 2024-04-14 DIAGNOSIS — M25572 Pain in left ankle and joints of left foot: Secondary | ICD-10-CM

## 2024-04-14 NOTE — Telephone Encounter (Signed)
 Requested medications are due for refill today.  no  Requested medications are on the active medications list.  no  Last refill. See note  Future visit scheduled.   yes  Notes to clinic.  Pt is not longer taking the 150 nor the 150 mg.  200mg  was refilled 04/12/2024. Please review.   Requested Prescriptions  Pending Prescriptions Disp Refills   QUEtiapine  (SEROQUEL ) 50 MG tablet [Pharmacy Med Name: QUETIAPINE  FUMARATE 50 MG TAB] 30 tablet 0    Sig: TAKE 1 TABLET BY MOUTH EVERYDAY AT BEDTIME     Not Delegated - Psychiatry:  Antipsychotics - Second Generation (Atypical) - quetiapine  Failed - 04/14/2024 10:05 AM      Failed - This refill cannot be delegated      Failed - Lipid Panel in normal range within the last 12 months    Cholesterol, Total  Date Value Ref Range Status  04/12/2024 167 100 - 199 mg/dL Final   Cholesterol Piccolo, Waived  Date Value Ref Range Status  11/12/2016 173 <200 mg/dL Final    Comment:                            Desirable                <200                         Borderline High      200- 239                         High                     >239    LDL Chol Calc (NIH)  Date Value Ref Range Status  04/12/2024 90 0 - 99 mg/dL Final   HDL  Date Value Ref Range Status  04/12/2024 59 >39 mg/dL Final   Triglycerides  Date Value Ref Range Status  04/12/2024 102 0 - 149 mg/dL Final   Triglycerides Piccolo,Waived  Date Value Ref Range Status  11/12/2016 210 (H) <150 mg/dL Final    Comment:                            Normal                   <150                         Borderline High     150 - 199                         High                200 - 499                         Very High                >499          Failed - CBC within normal limits and completed in the last 12 months    WBC  Date Value Ref Range Status  04/12/2024 6.8 3.4 - 10.8 x10E3/uL Final  07/18/2020 4.6 4.0 - 10.5 K/uL Final   RBC  Date Value  Ref Range Status   04/12/2024 3.71 (L) 3.77 - 5.28 x10E6/uL Final  07/18/2020 3.40 (L) 3.87 - 5.11 MIL/uL Final   Hemoglobin  Date Value Ref Range Status  04/12/2024 12.7 11.1 - 15.9 g/dL Final   Hematocrit  Date Value Ref Range Status  04/12/2024 38.7 34.0 - 46.6 % Final   MCHC  Date Value Ref Range Status  04/12/2024 32.8 31.5 - 35.7 g/dL Final  89/78/7978 67.5 30.0 - 36.0 g/dL Final   [facility]  Date Value Ref Range Status  04/12/2024 34.2 (H) 26.6 - 33.0 pg Final  07/18/2020 32.9 26.0 - 34.0 pg Final   MCV  Date Value Ref Range Status  04/12/2024 104 (H) 79 - 97 fL Final   No results found for: PLTCOUNTKUC, LABPLAT, POCPLA RDW  Date Value Ref Range Status  04/12/2024 11.9 11.7 - 15.4 % Final         Failed - CMP within normal limits and completed in the last 12 months    Albumin   Date Value Ref Range Status  04/12/2024 4.2 3.8 - 4.8 g/dL Final   Alkaline Phosphatase  Date Value Ref Range Status  04/12/2024 97 44 - 121 IU/L Final   ALT  Date Value Ref Range Status  04/12/2024 9 0 - 32 IU/L Final   ALT (SGPT) Piccolo, Waived  Date Value Ref Range Status  11/12/2016 15 10 - 47 U/L Final   AST  Date Value Ref Range Status  04/12/2024 18 0 - 40 IU/L Final   AST (SGOT) Piccolo, Waived  Date Value Ref Range Status  11/12/2016 19 11 - 38 U/L Final   BUN  Date Value Ref Range Status  04/12/2024 19 8 - 27 mg/dL Final   Calcium   Date Value Ref Range Status  04/12/2024 9.4 8.7 - 10.3 mg/dL Final   CO2  Date Value Ref Range Status  04/12/2024 22 20 - 29 mmol/L Final   Bicarbonate  Date Value Ref Range Status  04/07/2018 20.1 20.0 - 28.0 mmol/L Corrected   Creatinine, Ser  Date Value Ref Range Status  04/12/2024 1.04 (H) 0.57 - 1.00 mg/dL Final   Glucose  Date Value Ref Range Status  04/12/2024 87 70 - 99 mg/dL Final   Glucose, Bld  Date Value Ref Range Status  07/18/2020 81 70 - 99 mg/dL Final    Comment:    Glucose reference range applies only to samples  taken after fasting for at least 8 hours.   Glucose-Capillary  Date Value Ref Range Status  04/14/2018 84 70 - 99 mg/dL Final   Potassium  Date Value Ref Range Status  04/12/2024 4.8 3.5 - 5.2 mmol/L Final   Sodium  Date Value Ref Range Status  04/12/2024 142 134 - 144 mmol/L Final   Bilirubin Total  Date Value Ref Range Status  04/12/2024 <0.2 0.0 - 1.2 mg/dL Final   Bilirubin, Direct  Date Value Ref Range Status  04/05/2018 0.2 0.0 - 0.2 mg/dL Final    Comment:    Please note change in reference range.   Indirect Bilirubin  Date Value Ref Range Status  04/05/2018 0.4 0.3 - 0.9 mg/dL Final    Comment:    Performed at [facility], [address] [place], [place] [address]   Protein, ur  Date Value Ref Range Status  04/05/2018 30 (A) NEGATIVE mg/dL Final   Protein,UA  Date Value Ref Range Status  07/30/2022 1+ (A) Negative/Trace Final   Total Protein  Date  Value Ref Range Status  04/12/2024 6.8 6.0 - 8.5 g/dL Final   GFR calc Af Amer  Date Value Ref Range Status  11/24/2019 57 (L) >59 mL/min/1.73 Final   eGFR  Date Value Ref Range Status  04/12/2024 57 (L) >59 mL/min/1.73 Final   GFR, Estimated  Date Value Ref Range Status  07/18/2020 >60 >60 mL/min Final    Comment:    (NOTE) Calculated using the CKD-EPI Creatinine Equation (2021)          Passed - TSH in normal range and within 360 days    TSH  Date Value Ref Range Status  04/12/2024 0.699 0.450 - 4.500 uIU/mL Final         Passed - Completed PHQ-2 or PHQ-9 in the last 360 days      Passed - Last BP in normal range    BP Readings from Last 1 Encounters:  04/12/24 113/74         Passed - Last Heart Rate in normal range    Pulse Readings from Last 1 Encounters:  04/12/24 93         Passed - Valid encounter within last 6 months    Recent Outpatient Visits           2 days ago Hyperlipidemia, unspecified hyperlipidemia type   [place] [facility],  Megan P, DO               QUEtiapine  Fumarate (SEROQUEL  XR) 150 MG 24 hr tablet [Pharmacy Med Name: QUETIAPINE  ER 150 MG TABLET] 30 tablet 0    Sig: TAKE 1 TABLET BY MOUTH AT BEDTIME.     Not Delegated - Psychiatry:  Antipsychotics - Second Generation (Atypical) - quetiapine  Failed - 04/14/2024 10:05 AM      Failed - This refill cannot be delegated      Failed - Lipid Panel in normal range within the last 12 months    Cholesterol, Total  Date Value Ref Range Status  04/12/2024 167 100 - 199 mg/dL Final   Cholesterol Piccolo, Waived  Date Value Ref Range Status  11/12/2016 173 <200 mg/dL Final    Comment:                            Desirable                <200                         Borderline High      200- 239                         High                     >239    LDL Chol Calc (NIH)  Date Value Ref Range Status  04/12/2024 90 0 - 99 mg/dL Final   HDL  Date Value Ref Range Status  04/12/2024 59 >39 mg/dL Final   Triglycerides  Date Value Ref Range Status  04/12/2024 102 0 - 149 mg/dL Final   Triglycerides Piccolo,Waived  Date Value Ref Range Status  11/12/2016 210 (H) <150 mg/dL Final    Comment:                            Normal                   <  150                         Borderline High     150 - 199                         High                200 - 499                         Very High                >499          Failed - CBC within normal limits and completed in the last 12 months    WBC  Date Value Ref Range Status  04/12/2024 6.8 3.4 - 10.8 x10E3/uL Final  07/18/2020 4.6 4.0 - 10.5 K/uL Final   RBC  Date Value Ref Range Status  04/12/2024 3.71 (L) 3.77 - 5.28 x10E6/uL Final  07/18/2020 3.40 (L) 3.87 - 5.11 MIL/uL Final   Hemoglobin  Date Value Ref Range Status  04/12/2024 12.7 11.1 - 15.9 g/dL Final   Hematocrit  Date Value Ref Range Status  04/12/2024 38.7 34.0 - 46.6 % Final   MCHC  Date Value Ref Range Status  04/12/2024 32.8  31.5 - 35.7 g/dL Final  89/78/7978 67.5 30.0 - 36.0 g/dL Final   [facility]  Date Value Ref Range Status  04/12/2024 34.2 (H) 26.6 - 33.0 pg Final  07/18/2020 32.9 26.0 - 34.0 pg Final   MCV  Date Value Ref Range Status  04/12/2024 104 (H) 79 - 97 fL Final   No results found for: PLTCOUNTKUC, LABPLAT, POCPLA RDW  Date Value Ref Range Status  04/12/2024 11.9 11.7 - 15.4 % Final         Failed - CMP within normal limits and completed in the last 12 months    Albumin   Date Value Ref Range Status  04/12/2024 4.2 3.8 - 4.8 g/dL Final   Alkaline Phosphatase  Date Value Ref Range Status  04/12/2024 97 44 - 121 IU/L Final   ALT  Date Value Ref Range Status  04/12/2024 9 0 - 32 IU/L Final   ALT (SGPT) Piccolo, Waived  Date Value Ref Range Status  11/12/2016 15 10 - 47 U/L Final   AST  Date Value Ref Range Status  04/12/2024 18 0 - 40 IU/L Final   AST (SGOT) Piccolo, Waived  Date Value Ref Range Status  11/12/2016 19 11 - 38 U/L Final   BUN  Date Value Ref Range Status  04/12/2024 19 8 - 27 mg/dL Final   Calcium   Date Value Ref Range Status  04/12/2024 9.4 8.7 - 10.3 mg/dL Final   CO2  Date Value Ref Range Status  04/12/2024 22 20 - 29 mmol/L Final   Bicarbonate  Date Value Ref Range Status  04/07/2018 20.1 20.0 - 28.0 mmol/L Corrected   Creatinine, Ser  Date Value Ref Range Status  04/12/2024 1.04 (H) 0.57 - 1.00 mg/dL Final   Glucose  Date Value Ref Range Status  04/12/2024 87 70 - 99 mg/dL Final   Glucose, Bld  Date Value Ref Range Status  07/18/2020 81 70 - 99 mg/dL Final    Comment:    Glucose reference range applies only to samples taken after fasting for at least 8 hours.   Glucose-Capillary  Date Value Ref Range Status  04/14/2018 84 70 - 99 mg/dL Final   Potassium  Date Value Ref Range Status  04/12/2024 4.8 3.5 - 5.2 mmol/L Final   Sodium  Date Value Ref Range Status  04/12/2024 142 134 - 144 mmol/L Final   Bilirubin Total  Date  Value Ref Range Status  04/12/2024 <0.2 0.0 - 1.2 mg/dL Final   Bilirubin, Direct  Date Value Ref Range Status  04/05/2018 0.2 0.0 - 0.2 mg/dL Final    Comment:    Please note change in reference range.   Indirect Bilirubin  Date Value Ref Range Status  04/05/2018 0.4 0.3 - 0.9 mg/dL Final    Comment:    Performed at [facility], [address] [place], [place] [address]   Protein, ur  Date Value Ref Range Status  04/05/2018 30 (A) NEGATIVE mg/dL Final   Protein,UA  Date Value Ref Range Status  07/30/2022 1+ (A) Negative/Trace Final   Total Protein  Date Value Ref Range Status  04/12/2024 6.8 6.0 - 8.5 g/dL Final   GFR calc Af Amer  Date Value Ref Range Status  11/24/2019 57 (L) >59 mL/min/1.73 Final   eGFR  Date Value Ref Range Status  04/12/2024 57 (L) >59 mL/min/1.73 Final   GFR, Estimated  Date Value Ref Range Status  07/18/2020 >60 >60 mL/min Final    Comment:    (NOTE) Calculated using the CKD-EPI Creatinine Equation (2021)          Passed - TSH in normal range and within 360 days    TSH  Date Value Ref Range Status  04/12/2024 0.699 0.450 - 4.500 uIU/mL Final         Passed - Completed PHQ-2 or PHQ-9 in the last 360 days      Passed - Last BP in normal range    BP Readings from Last 1 Encounters:  04/12/24 113/74         Passed - Last Heart Rate in normal range    Pulse Readings from Last 1 Encounters:  04/12/24 93         Passed - Valid encounter within last 6 months    Recent Outpatient Visits           2 days ago Hyperlipidemia, unspecified hyperlipidemia type   [facility] [place], Megan P, DO

## 2024-04-17 ENCOUNTER — Other Ambulatory Visit: Payer: Self-pay | Admitting: Family Medicine

## 2024-04-18 ENCOUNTER — Other Ambulatory Visit: Payer: Self-pay | Admitting: Family Medicine

## 2024-04-18 DIAGNOSIS — M25572 Pain in left ankle and joints of left foot: Secondary | ICD-10-CM

## 2024-04-18 NOTE — Telephone Encounter (Signed)
 Last OV 04/12/2024

## 2024-04-18 NOTE — Telephone Encounter (Unsigned)
 Copied from CRM #[id number]. Topic: Clinical - Medication Refill >> Apr 18, 2024  1:44 PM Wess RAMAN wrote: Medication: cyclobenzaprine  (FLEXERIL ) 5 MG tablet   traZODone  (DESYREL ) 100 MG tablet  Has the patient contacted their pharmacy? No (Agent: If no, request that the patient contact the pharmacy for the refill. If patient does not wish to contact the pharmacy document the reason why and proceed with request.) (Agent: If yes, when and what did the pharmacy advise?)  This is the patient's preferred pharmacy:  [id number] - [place], [place] - [address] [place] [address] Phone: [phone] Fax: [phone]  Is this the correct pharmacy for this prescription? Yes If no, delete pharmacy and type the correct one.   Has the prescription been filled recently? Yes  Is the patient out of the medication? No  Has the patient been seen for an appointment in the last year OR does the patient have an upcoming appointment? Yes  Can we respond through MyChart? Yes  Agent: Please be advised that Rx refills may take up to 3 business days. We ask that you follow-up with your pharmacy.

## 2024-04-19 ENCOUNTER — Telehealth: Payer: Self-pay | Admitting: Family Medicine

## 2024-04-19 ENCOUNTER — Ambulatory Visit: Payer: Self-pay | Admitting: Family Medicine

## 2024-04-19 MED ORDER — LEVOTHYROXINE SODIUM 25 MCG PO TABS: 25.0000 ug | ORAL_TABLET | Freq: Every day | ORAL | 3 refills | Status: AC

## 2024-04-19 NOTE — Telephone Encounter (Signed)
 Patient dropped off Medical Clearance Form to be filled out by provider. Patient is requesting a call back at [phone] within 2-5 days when completed. Document is located in providers folder.

## 2024-04-20 NOTE — Telephone Encounter (Signed)
 Copied from CRM #[id number]. Topic: General - Other >> Apr 20, 2024 11:48 AM Travis F wrote: Reason for CRM: Patient is calling in checking on the status of a form she dropped off. Patient says her recruiter says they need the form completed by today. Please follow up with patient.

## 2024-04-20 NOTE — Telephone Encounter (Signed)
 Requested medication (s) are due for refill today: no  Requested medication (s) are on the active medication list: yes  Last refill:  04/12/24 #30 6 RF  Future visit scheduled: yes  Notes to clinic:  med not delegated to NT to refuse    Requested Prescriptions  Pending Prescriptions Disp Refills   cyclobenzaprine  (FLEXERIL ) 5 MG tablet 30 tablet     Sig: TAKE 1 TABLET BY MOUTH EVERYDAY AT BEDTIME     Not Delegated - Analgesics:  Muscle Relaxants Failed - 04/20/2024  1:54 PM      Failed - This refill cannot be delegated      Passed - Valid encounter within last 6 months    Recent Outpatient Visits           1 week ago Hyperlipidemia, unspecified hyperlipidemia type   [facility] [place], Megan P, DO              Refused Prescriptions Disp Refills   traZODone  (DESYREL ) 100 MG tablet 135 tablet     Sig: TAKE 1 TO 1&1/2 TABLETS (100-150 MG TOTAL) BY MOUTH AT BEDTIME AS NEEDED FOR SLEEPStrength: 100 mg     Psychiatry: Antidepressants - Serotonin Modulator Passed - 04/20/2024  1:54 PM      Passed - Completed PHQ-2 or PHQ-9 in the last 360 days      Passed - Valid encounter within last 6 months    Recent Outpatient Visits           1 week ago Hyperlipidemia, unspecified hyperlipidemia type   [facility] [place], Megan P, DO

## 2024-04-20 NOTE — Telephone Encounter (Signed)
 Per patient. Going to pick up today

## 2024-04-20 NOTE — Telephone Encounter (Signed)
 Unable to refill per protocol, Rx discontinued 03/06/24.  Requested Prescriptions  Pending Prescriptions Disp Refills   QUEtiapine  Fumarate (SEROQUEL  XR) 150 MG 24 hr tablet [Pharmacy Med Name: QUETIAPINE  ER 150 MG TABLET] 90 tablet 1    Sig: TAKE 1 TABLET BY MOUTH AT BEDTIME.     Not Delegated - Psychiatry:  Antipsychotics - Second Generation (Atypical) - quetiapine  Failed - 04/20/2024  8:56 AM      Failed - This refill cannot be delegated      Failed - Lipid Panel in normal range within the last 12 months    Cholesterol, Total  Date Value Ref Range Status  04/12/2024 167 100 - 199 mg/dL Final   Cholesterol Piccolo, Waived  Date Value Ref Range Status  11/12/2016 173 <200 mg/dL Final    Comment:                            Desirable                <200                         Borderline High      200- 239                         High                     >239    LDL Chol Calc (NIH)  Date Value Ref Range Status  04/12/2024 90 0 - 99 mg/dL Final   HDL  Date Value Ref Range Status  04/12/2024 59 >39 mg/dL Final   Triglycerides  Date Value Ref Range Status  04/12/2024 102 0 - 149 mg/dL Final   Triglycerides Piccolo,Waived  Date Value Ref Range Status  11/12/2016 210 (H) <150 mg/dL Final    Comment:                            Normal                   <150                         Borderline High     150 - 199                         High                200 - 499                         Very High                >499          Failed - CBC within normal limits and completed in the last 12 months    WBC  Date Value Ref Range Status  04/12/2024 6.8 3.4 - 10.8 x10E3/uL Final  07/18/2020 4.6 4.0 - 10.5 K/uL Final   RBC  Date Value Ref Range Status  04/12/2024 3.71 (L) 3.77 - 5.28 x10E6/uL Final  07/18/2020 3.40 (L) 3.87 - 5.11 MIL/uL Final   Hemoglobin  Date Value Ref Range Status  04/12/2024 12.7 11.1 - 15.9 g/dL Final   Hematocrit  Date Value  Ref Range Status   04/12/2024 38.7 34.0 - 46.6 % Final   MCHC  Date Value Ref Range Status  04/12/2024 32.8 31.5 - 35.7 g/dL Final  89/78/7978 67.5 30.0 - 36.0 g/dL Final   [facility]  Date Value Ref Range Status  04/12/2024 34.2 (H) 26.6 - 33.0 pg Final  07/18/2020 32.9 26.0 - 34.0 pg Final   MCV  Date Value Ref Range Status  04/12/2024 104 (H) 79 - 97 fL Final   No results found for: PLTCOUNTKUC, LABPLAT, POCPLA RDW  Date Value Ref Range Status  04/12/2024 11.9 11.7 - 15.4 % Final         Failed - CMP within normal limits and completed in the last 12 months    Albumin   Date Value Ref Range Status  04/12/2024 4.2 3.8 - 4.8 g/dL Final   Alkaline Phosphatase  Date Value Ref Range Status  04/12/2024 97 44 - 121 IU/L Final   ALT  Date Value Ref Range Status  04/12/2024 9 0 - 32 IU/L Final   ALT (SGPT) Piccolo, Waived  Date Value Ref Range Status  11/12/2016 15 10 - 47 U/L Final   AST  Date Value Ref Range Status  04/12/2024 18 0 - 40 IU/L Final   AST (SGOT) Piccolo, Waived  Date Value Ref Range Status  11/12/2016 19 11 - 38 U/L Final   BUN  Date Value Ref Range Status  04/12/2024 19 8 - 27 mg/dL Final   Calcium   Date Value Ref Range Status  04/12/2024 9.4 8.7 - 10.3 mg/dL Final   CO2  Date Value Ref Range Status  04/12/2024 22 20 - 29 mmol/L Final   Bicarbonate  Date Value Ref Range Status  04/07/2018 20.1 20.0 - 28.0 mmol/L Corrected   Creatinine, Ser  Date Value Ref Range Status  04/12/2024 1.04 (H) 0.57 - 1.00 mg/dL Final   Glucose  Date Value Ref Range Status  04/12/2024 87 70 - 99 mg/dL Final   Glucose, Bld  Date Value Ref Range Status  07/18/2020 81 70 - 99 mg/dL Final    Comment:    Glucose reference range applies only to samples taken after fasting for at least 8 hours.   Glucose-Capillary  Date Value Ref Range Status  04/14/2018 84 70 - 99 mg/dL Final   Potassium  Date Value Ref Range Status  04/12/2024 4.8 3.5 - 5.2 mmol/L Final   Sodium   Date Value Ref Range Status  04/12/2024 142 134 - 144 mmol/L Final   Bilirubin Total  Date Value Ref Range Status  04/12/2024 <0.2 0.0 - 1.2 mg/dL Final   Bilirubin, Direct  Date Value Ref Range Status  04/05/2018 0.2 0.0 - 0.2 mg/dL Final    Comment:    Please note change in reference range.   Indirect Bilirubin  Date Value Ref Range Status  04/05/2018 0.4 0.3 - 0.9 mg/dL Final    Comment:    Performed at [facility], [address] [place], [place] [address]   Protein, ur  Date Value Ref Range Status  04/05/2018 30 (A) NEGATIVE mg/dL Final   Protein,UA  Date Value Ref Range Status  07/30/2022 1+ (A) Negative/Trace Final   Total Protein  Date Value Ref Range Status  04/12/2024 6.8 6.0 - 8.5 g/dL Final   GFR calc Af Amer  Date Value Ref Range Status  11/24/2019 57 (L) >59 mL/min/1.73 Final   eGFR  Date Value Ref Range Status  04/12/2024 57 (L) >  59 mL/min/1.73 Final   GFR, Estimated  Date Value Ref Range Status  07/18/2020 >60 >60 mL/min Final    Comment:    (NOTE) Calculated using the CKD-EPI Creatinine Equation (2021)          Passed - TSH in normal range and within 360 days    TSH  Date Value Ref Range Status  04/12/2024 0.699 0.450 - 4.500 uIU/mL Final         Passed - Completed PHQ-2 or PHQ-9 in the last 360 days      Passed - Last BP in normal range    BP Readings from Last 1 Encounters:  04/12/24 113/74         Passed - Last Heart Rate in normal range    Pulse Readings from Last 1 Encounters:  04/12/24 93         Passed - Valid encounter within last 6 months    Recent Outpatient Visits           1 week ago Hyperlipidemia, unspecified hyperlipidemia type   [facility] [place], Megan P, DO

## 2024-04-20 NOTE — Telephone Encounter (Signed)
 Requested Prescriptions  Pending Prescriptions Disp Refills   traZODone  (DESYREL ) 100 MG tablet 135 tablet     Sig: TAKE 1 TO 1&1/2 TABLETS (100-150 MG TOTAL) BY MOUTH AT BEDTIME AS NEEDED FOR SLEEPStrength: 100 mg     Psychiatry: Antidepressants - Serotonin Modulator Passed - 04/20/2024  1:53 PM      Passed - Completed PHQ-2 or PHQ-9 in the last 360 days      Passed - Valid encounter within last 6 months    Recent Outpatient Visits           1 week ago Hyperlipidemia, unspecified hyperlipidemia type   [facility], Megan P, DO               cyclobenzaprine  (FLEXERIL ) 5 MG tablet 30 tablet     Sig: TAKE 1 TABLET BY MOUTH EVERYDAY AT BEDTIME     Not Delegated - Analgesics:  Muscle Relaxants Failed - 04/20/2024  1:53 PM      Failed - This refill cannot be delegated      Passed - Valid encounter within last 6 months    Recent Outpatient Visits           1 week ago Hyperlipidemia, unspecified hyperlipidemia type   [facility] [place], Megan P, DO

## 2024-04-21 NOTE — Telephone Encounter (Signed)
 Called and notified patient that her form was ready to be picked up. Patient states that she picked up yesterday. Advised patient we would scan this into her chart to have on file.

## 2024-05-04 ENCOUNTER — Other Ambulatory Visit: Payer: Self-pay | Admitting: Family Medicine

## 2024-05-06 NOTE — Telephone Encounter (Signed)
 Requested medication (s) are due for refill today: Yes  Requested medication (s) are on the active medication list: Yes  Last refill:  04/12/24  Future visit scheduled: Yes  Notes to clinic:  Routing to provider for approval of a 90 day supply, med prescribed and f/u due.     Requested Prescriptions  Pending Prescriptions Disp Refills   hydrOXYzine  (VISTARIL ) 25 MG capsule [Pharmacy Med Name: HYDROXYZINE  PAM 25 MG CAP] 270 capsule 1    Sig: Take 1 capsule (25 mg total) by mouth every 8 (eight) hours as needed.     Ear, Nose, and Throat:  Antihistamines 2 Failed - 05/06/2024  8:45 PM      Failed - Cr in normal range and within 360 days    Creatinine, Ser  Date Value Ref Range Status  04/12/2024 1.04 (H) 0.57 - 1.00 mg/dL Final         Passed - Valid encounter within last 12 months    Recent Outpatient Visits           3 weeks ago Hyperlipidemia, unspecified hyperlipidemia type   [place] [place], Megan P, DO

## 2024-05-07 ENCOUNTER — Other Ambulatory Visit: Payer: Self-pay | Admitting: Family Medicine

## 2024-05-10 NOTE — Telephone Encounter (Signed)
 Requested medication (s) are due for refill today: No  Requested medication (s) are on the active medication list: No  Last refill:    Future visit scheduled: Yes  Notes to clinic:  Not delegated, medications D/C.    Requested Prescriptions  Pending Prescriptions Disp Refills   QUEtiapine  Fumarate (SEROQUEL  XR) 150 MG 24 hr tablet [Pharmacy Med Name: QUETIAPINE  ER 150 MG TABLET] 90 tablet 1    Sig: TAKE 1 TABLET BY MOUTH AT BEDTIME.     Not Delegated - Psychiatry:  Antipsychotics - Second Generation (Atypical) - quetiapine  Failed - 05/10/2024  3:53 PM      Failed - This refill cannot be delegated      Failed - Lipid Panel in normal range within the last 12 months    Cholesterol, Total  Date Value Ref Range Status  04/12/2024 167 100 - 199 mg/dL Final   Cholesterol Piccolo, Waived  Date Value Ref Range Status  11/12/2016 173 <200 mg/dL Final    Comment:                            Desirable                <200                         Borderline High      200- 239                         High                     >239    LDL Chol Calc (NIH)  Date Value Ref Range Status  04/12/2024 90 0 - 99 mg/dL Final   HDL  Date Value Ref Range Status  04/12/2024 59 >39 mg/dL Final   Triglycerides  Date Value Ref Range Status  04/12/2024 102 0 - 149 mg/dL Final   Triglycerides Piccolo,Waived  Date Value Ref Range Status  11/12/2016 210 (H) <150 mg/dL Final    Comment:                            Normal                   <150                         Borderline High     150 - 199                         High                200 - 499                         Very High                >499          Passed - TSH in normal range and within 360 days    TSH  Date Value Ref Range Status  04/12/2024 0.699 0.450 - 4.500 uIU/mL Final         Passed - Completed PHQ-2 or PHQ-9 in the last 360 days      Passed - Last BP  in normal range    BP Readings from Last 1 Encounters:  04/12/24  113/74         Passed - Last Heart Rate in normal range    Pulse Readings from Last 1 Encounters:  04/12/24 93         Passed - Valid encounter within last 6 months    Recent Outpatient Visits           4 weeks ago Hyperlipidemia, unspecified hyperlipidemia type    [facility] [place], Megan P, DO              Passed - CBC within normal limits and completed in the last 12 months    WBC  Date Value Ref Range Status  04/12/2024 6.8 3.4 - 10.8 x10E3/uL Final  07/18/2020 4.6 4.0 - 10.5 K/uL Final   RBC  Date Value Ref Range Status  04/12/2024 3.71 (L) 3.77 - 5.28 x10E6/uL Final  07/18/2020 3.40 (L) 3.87 - 5.11 MIL/uL Final   Hemoglobin  Date Value Ref Range Status  04/12/2024 12.7 11.1 - 15.9 g/dL Final   Hematocrit  Date Value Ref Range Status  04/12/2024 38.7 34.0 - 46.6 % Final   MCHC  Date Value Ref Range Status  04/12/2024 32.8 31.5 - 35.7 g/dL Final  89/78/7978 67.5 30.0 - 36.0 g/dL Final   [facility]  Date Value Ref Range Status  04/12/2024 34.2 (H) 26.6 - 33.0 pg Final  07/18/2020 32.9 26.0 - 34.0 pg Final   MCV  Date Value Ref Range Status  04/12/2024 104 (H) 79 - 97 fL Final   No results found for: PLTCOUNTKUC, LABPLAT, POCPLA RDW  Date Value Ref Range Status  04/12/2024 11.9 11.7 - 15.4 % Final         Passed - CMP within normal limits and completed in the last 12 months    Albumin   Date Value Ref Range Status  04/12/2024 4.2 3.8 - 4.8 g/dL Final   Alkaline Phosphatase  Date Value Ref Range Status  04/12/2024 97 44 - 121 IU/L Final   ALT  Date Value Ref Range Status  04/12/2024 9 0 - 32 IU/L Final   ALT (SGPT) Piccolo, Waived  Date Value Ref Range Status  11/12/2016 15 10 - 47 U/L Final   AST  Date Value Ref Range Status  04/12/2024 18 0 - 40 IU/L Final   AST (SGOT) Piccolo, Waived  Date Value Ref Range Status  11/12/2016 19 11 - 38 U/L Final   BUN  Date Value Ref Range Status  04/12/2024 19 8 - 27  mg/dL Final   Calcium   Date Value Ref Range Status  04/12/2024 9.4 8.7 - 10.3 mg/dL Final   CO2  Date Value Ref Range Status  04/12/2024 22 20 - 29 mmol/L Final   Bicarbonate  Date Value Ref Range Status  04/07/2018 20.1 20.0 - 28.0 mmol/L Corrected   Creatinine, Ser  Date Value Ref Range Status  04/12/2024 1.04 (H) 0.57 - 1.00 mg/dL Final   Glucose  Date Value Ref Range Status  04/12/2024 87 70 - 99 mg/dL Final   Glucose, Bld  Date Value Ref Range Status  07/18/2020 81 70 - 99 mg/dL Final    Comment:    Glucose reference range applies only to samples taken after fasting for at least 8 hours.   Glucose-Capillary  Date Value Ref Range Status  04/14/2018 84 70 - 99 mg/dL Final   Potassium  Date Value Ref  Range Status  04/12/2024 4.8 3.5 - 5.2 mmol/L Final   Sodium  Date Value Ref Range Status  04/12/2024 142 134 - 144 mmol/L Final   Bilirubin Total  Date Value Ref Range Status  04/12/2024 <0.2 0.0 - 1.2 mg/dL Final   Bilirubin, Direct  Date Value Ref Range Status  04/05/2018 0.2 0.0 - 0.2 mg/dL Final    Comment:    Please note change in reference range.   Indirect Bilirubin  Date Value Ref Range Status  04/05/2018 0.4 0.3 - 0.9 mg/dL Final    Comment:    Performed at [facility], [address] [place] [place], [place] [address]   Protein, ur  Date Value Ref Range Status  04/05/2018 30 (A) NEGATIVE mg/dL Final   Protein,UA  Date Value Ref Range Status  07/30/2022 1+ (A) Negative/Trace Final   Total Protein  Date Value Ref Range Status  04/12/2024 6.8 6.0 - 8.5 g/dL Final   GFR calc Af Amer  Date Value Ref Range Status  11/24/2019 57 (L) >59 mL/min/1.73 Final   eGFR  Date Value Ref Range Status  04/12/2024 57 (L) >59 mL/min/1.73 Final   GFR, Estimated  Date Value Ref Range Status  07/18/2020 >60 >60 mL/min Final    Comment:    (NOTE) Calculated using the CKD-EPI Creatinine Equation (2021)           QUEtiapine  (SEROQUEL ) 50 MG  tablet [Pharmacy Med Name: QUETIAPINE  FUMARATE 50 MG TAB] 30 tablet 0    Sig: TAKE 1 TABLET BY MOUTH EVERYDAY AT BEDTIME     Not Delegated - Psychiatry:  Antipsychotics - Second Generation (Atypical) - quetiapine  Failed - 05/10/2024  3:53 PM      Failed - This refill cannot be delegated      Failed - Lipid Panel in normal range within the last 12 months    Cholesterol, Total  Date Value Ref Range Status  04/12/2024 167 100 - 199 mg/dL Final   Cholesterol Piccolo, Waived  Date Value Ref Range Status  11/12/2016 173 <200 mg/dL Final    Comment:                            Desirable                <200                         Borderline High      200- 239                         High                     >239    LDL Chol Calc (NIH)  Date Value Ref Range Status  04/12/2024 90 0 - 99 mg/dL Final   HDL  Date Value Ref Range Status  04/12/2024 59 >39 mg/dL Final   Triglycerides  Date Value Ref Range Status  04/12/2024 102 0 - 149 mg/dL Final   Triglycerides Piccolo,Waived  Date Value Ref Range Status  11/12/2016 210 (H) <150 mg/dL Final    Comment:                            Normal                   <  150                         Borderline High     150 - 199                         High                200 - 499                         Very High                >499          Passed - TSH in normal range and within 360 days    TSH  Date Value Ref Range Status  04/12/2024 0.699 0.450 - 4.500 uIU/mL Final         Passed - Completed PHQ-2 or PHQ-9 in the last 360 days      Passed - Last BP in normal range    BP Readings from Last 1 Encounters:  04/12/24 113/74         Passed - Last Heart Rate in normal range    Pulse Readings from Last 1 Encounters:  04/12/24 93         Passed - Valid encounter within last 6 months    Recent Outpatient Visits           4 weeks ago Hyperlipidemia, unspecified hyperlipidemia type   [place] [facility] [place], Megan  P, DO              Passed - CBC within normal limits and completed in the last 12 months    WBC  Date Value Ref Range Status  04/12/2024 6.8 3.4 - 10.8 x10E3/uL Final  07/18/2020 4.6 4.0 - 10.5 K/uL Final   RBC  Date Value Ref Range Status  04/12/2024 3.71 (L) 3.77 - 5.28 x10E6/uL Final  07/18/2020 3.40 (L) 3.87 - 5.11 MIL/uL Final   Hemoglobin  Date Value Ref Range Status  04/12/2024 12.7 11.1 - 15.9 g/dL Final   Hematocrit  Date Value Ref Range Status  04/12/2024 38.7 34.0 - 46.6 % Final   MCHC  Date Value Ref Range Status  04/12/2024 32.8 31.5 - 35.7 g/dL Final  89/78/7978 67.5 30.0 - 36.0 g/dL Final   [facility]  Date Value Ref Range Status  04/12/2024 34.2 (H) 26.6 - 33.0 pg Final  07/18/2020 32.9 26.0 - 34.0 pg Final   MCV  Date Value Ref Range Status  04/12/2024 104 (H) 79 - 97 fL Final   No results found for: PLTCOUNTKUC, LABPLAT, POCPLA RDW  Date Value Ref Range Status  04/12/2024 11.9 11.7 - 15.4 % Final         Passed - CMP within normal limits and completed in the last 12 months    Albumin   Date Value Ref Range Status  04/12/2024 4.2 3.8 - 4.8 g/dL Final   Alkaline Phosphatase  Date Value Ref Range Status  04/12/2024 97 44 - 121 IU/L Final   ALT  Date Value Ref Range Status  04/12/2024 9 0 - 32 IU/L Final   ALT (SGPT) Piccolo, Waived  Date Value Ref Range Status  11/12/2016 15 10 - 47 U/L Final   AST  Date Value Ref Range Status  04/12/2024 18 0 - 40 IU/L Final   AST (SGOT) Piccolo,  Waived  Date Value Ref Range Status  11/12/2016 19 11 - 38 U/L Final   BUN  Date Value Ref Range Status  04/12/2024 19 8 - 27 mg/dL Final   Calcium   Date Value Ref Range Status  04/12/2024 9.4 8.7 - 10.3 mg/dL Final   CO2  Date Value Ref Range Status  04/12/2024 22 20 - 29 mmol/L Final   Bicarbonate  Date Value Ref Range Status  04/07/2018 20.1 20.0 - 28.0 mmol/L Corrected   Creatinine, Ser  Date Value Ref Range Status  04/12/2024 1.04  (H) 0.57 - 1.00 mg/dL Final   Glucose  Date Value Ref Range Status  04/12/2024 87 70 - 99 mg/dL Final   Glucose, Bld  Date Value Ref Range Status  07/18/2020 81 70 - 99 mg/dL Final    Comment:    Glucose reference range applies only to samples taken after fasting for at least 8 hours.   Glucose-Capillary  Date Value Ref Range Status  04/14/2018 84 70 - 99 mg/dL Final   Potassium  Date Value Ref Range Status  04/12/2024 4.8 3.5 - 5.2 mmol/L Final   Sodium  Date Value Ref Range Status  04/12/2024 142 134 - 144 mmol/L Final   Bilirubin Total  Date Value Ref Range Status  04/12/2024 <0.2 0.0 - 1.2 mg/dL Final   Bilirubin, Direct  Date Value Ref Range Status  04/05/2018 0.2 0.0 - 0.2 mg/dL Final    Comment:    Please note change in reference range.   Indirect Bilirubin  Date Value Ref Range Status  04/05/2018 0.4 0.3 - 0.9 mg/dL Final    Comment:    Performed at [facility], [address] [place], [place] [address]   Protein, ur  Date Value Ref Range Status  04/05/2018 30 (A) NEGATIVE mg/dL Final   Protein,UA  Date Value Ref Range Status  07/30/2022 1+ (A) Negative/Trace Final   Total Protein  Date Value Ref Range Status  04/12/2024 6.8 6.0 - 8.5 g/dL Final   GFR calc Af Amer  Date Value Ref Range Status  11/24/2019 57 (L) >59 mL/min/1.73 Final   eGFR  Date Value Ref Range Status  04/12/2024 57 (L) >59 mL/min/1.73 Final   GFR, Estimated  Date Value Ref Range Status  07/18/2020 >60 >60 mL/min Final    Comment:    (NOTE) Calculated using the CKD-EPI Creatinine Equation (2021)

## 2024-05-26 ENCOUNTER — Ambulatory Visit: Admitting: Family Medicine

## 2024-06-02 ENCOUNTER — Other Ambulatory Visit: Payer: Self-pay | Admitting: Family Medicine

## 2024-06-02 NOTE — Telephone Encounter (Signed)
 Requested Prescriptions  Pending Prescriptions Disp Refills   hydrOXYzine  (VISTARIL ) 25 MG capsule [Pharmacy Med Name: HYDROXYZINE  PAM 25 MG CAP] 90 capsule 1    Sig: TAKE 1 CAPSULE (25 MG TOTAL) BY MOUTH EVERY 8 (EIGHT) HOURS AS NEEDED.     Ear, Nose, and Throat:  Antihistamines 2 Failed - 06/02/2024  1:22 PM      Failed - Cr in normal range and within 360 days    Creatinine, Ser  Date Value Ref Range Status  04/12/2024 1.04 (H) 0.57 - 1.00 mg/dL Final         Passed - Valid encounter within last 12 months    Recent Outpatient Visits           1 month ago Hyperlipidemia, unspecified hyperlipidemia type   [place] [facility] [place], Megan P, DO

## 2024-06-07 ENCOUNTER — Other Ambulatory Visit: Payer: Self-pay | Admitting: Family Medicine

## 2024-06-08 ENCOUNTER — Encounter: Payer: Self-pay | Admitting: Family Medicine

## 2024-06-08 NOTE — Telephone Encounter (Signed)
 Requested medications are due for refill today.  no  Requested medications are on the active medications list.  no  Last refill.   Future visit scheduled.   yes  Notes to clinic.  Dosage changed. Refill/refusal not delegated.    Requested Prescriptions  Pending Prescriptions Disp Refills   QUEtiapine  (SEROQUEL ) 50 MG tablet [Pharmacy Med Name: QUETIAPINE  FUMARATE 50 MG TAB] 30 tablet 0    Sig: TAKE 1 TABLET BY MOUTH EVERYDAY AT BEDTIME     Not Delegated - Psychiatry:  Antipsychotics - Second Generation (Atypical) - quetiapine  Failed - 06/08/2024 12:52 PM      Failed - This refill cannot be delegated      Failed - Lipid Panel in normal range within the last 12 months    Cholesterol, Total  Date Value Ref Range Status  04/12/2024 167 100 - 199 mg/dL Final   Cholesterol Piccolo, Waived  Date Value Ref Range Status  11/12/2016 173 <200 mg/dL Final    Comment:                            Desirable                <200                         Borderline High      200- 239                         High                     >239    LDL Chol Calc (NIH)  Date Value Ref Range Status  04/12/2024 90 0 - 99 mg/dL Final   HDL  Date Value Ref Range Status  04/12/2024 59 >39 mg/dL Final   Triglycerides  Date Value Ref Range Status  04/12/2024 102 0 - 149 mg/dL Final   Triglycerides Piccolo,Waived  Date Value Ref Range Status  11/12/2016 210 (H) <150 mg/dL Final    Comment:                            Normal                   <150                         Borderline High     150 - 199                         High                200 - 499                         Very High                >499          Passed - TSH in normal range and within 360 days    TSH  Date Value Ref Range Status  04/12/2024 0.699 0.450 - 4.500 uIU/mL Final         Passed - Completed PHQ-2 or PHQ-9 in the last 360 days      Passed - Last BP in normal  range    BP Readings from Last 1 Encounters:   04/12/24 113/74         Passed - Last Heart Rate in normal range    Pulse Readings from Last 1 Encounters:  04/12/24 93         Passed - Valid encounter within last 6 months    Recent Outpatient Visits           1 month ago Hyperlipidemia, unspecified hyperlipidemia type   [facility] [place], Megan P, DO              Passed - CBC within normal limits and completed in the last 12 months    WBC  Date Value Ref Range Status  04/12/2024 6.8 3.4 - 10.8 x10E3/uL Final  07/18/2020 4.6 4.0 - 10.5 K/uL Final   RBC  Date Value Ref Range Status  04/12/2024 3.71 (L) 3.77 - 5.28 x10E6/uL Final  07/18/2020 3.40 (L) 3.87 - 5.11 MIL/uL Final   Hemoglobin  Date Value Ref Range Status  04/12/2024 12.7 11.1 - 15.9 g/dL Final   Hematocrit  Date Value Ref Range Status  04/12/2024 38.7 34.0 - 46.6 % Final   MCHC  Date Value Ref Range Status  04/12/2024 32.8 31.5 - 35.7 g/dL Final  89/78/7978 67.5 30.0 - 36.0 g/dL Final   [facility]  Date Value Ref Range Status  04/12/2024 34.2 (H) 26.6 - 33.0 pg Final  07/18/2020 32.9 26.0 - 34.0 pg Final   MCV  Date Value Ref Range Status  04/12/2024 104 (H) 79 - 97 fL Final   No results found for: PLTCOUNTKUC, LABPLAT, POCPLA RDW  Date Value Ref Range Status  04/12/2024 11.9 11.7 - 15.4 % Final         Passed - CMP within normal limits and completed in the last 12 months    Albumin   Date Value Ref Range Status  04/12/2024 4.2 3.8 - 4.8 g/dL Final   Alkaline Phosphatase  Date Value Ref Range Status  04/12/2024 97 44 - 121 IU/L Final   ALT  Date Value Ref Range Status  04/12/2024 9 0 - 32 IU/L Final   ALT (SGPT) Piccolo, Waived  Date Value Ref Range Status  11/12/2016 15 10 - 47 U/L Final   AST  Date Value Ref Range Status  04/12/2024 18 0 - 40 IU/L Final   AST (SGOT) Piccolo, Waived  Date Value Ref Range Status  11/12/2016 19 11 - 38 U/L Final   BUN  Date Value Ref Range Status  04/12/2024  19 8 - 27 mg/dL Final   Calcium   Date Value Ref Range Status  04/12/2024 9.4 8.7 - 10.3 mg/dL Final   CO2  Date Value Ref Range Status  04/12/2024 22 20 - 29 mmol/L Final   Bicarbonate  Date Value Ref Range Status  04/07/2018 20.1 20.0 - 28.0 mmol/L Corrected   Creatinine, Ser  Date Value Ref Range Status  04/12/2024 1.04 (H) 0.57 - 1.00 mg/dL Final   Glucose  Date Value Ref Range Status  04/12/2024 87 70 - 99 mg/dL Final   Glucose, Bld  Date Value Ref Range Status  07/18/2020 81 70 - 99 mg/dL Final    Comment:    Glucose reference range applies only to samples taken after fasting for at least 8 hours.   Glucose-Capillary  Date Value Ref Range Status  04/14/2018 84 70 - 99 mg/dL Final   Potassium  Date Value Ref Range Status  04/12/2024 4.8 3.5 - 5.2 mmol/L Final   Sodium  Date Value Ref Range Status  04/12/2024 142 134 - 144 mmol/L Final   Bilirubin Total  Date Value Ref Range Status  04/12/2024 <0.2 0.0 - 1.2 mg/dL Final   Bilirubin, Direct  Date Value Ref Range Status  04/05/2018 0.2 0.0 - 0.2 mg/dL Final    Comment:    Please note change in reference range.   Indirect Bilirubin  Date Value Ref Range Status  04/05/2018 0.4 0.3 - 0.9 mg/dL Final    Comment:    Performed at [facility], [address] [place], [place] [address]   Protein, ur  Date Value Ref Range Status  04/05/2018 30 (A) NEGATIVE mg/dL Final   Protein,UA  Date Value Ref Range Status  07/30/2022 1+ (A) Negative/Trace Final   Total Protein  Date Value Ref Range Status  04/12/2024 6.8 6.0 - 8.5 g/dL Final   GFR calc Af Amer  Date Value Ref Range Status  11/24/2019 57 (L) >59 mL/min/1.73 Final   eGFR  Date Value Ref Range Status  04/12/2024 57 (L) >59 mL/min/1.73 Final   GFR, Estimated  Date Value Ref Range Status  07/18/2020 >60 >60 mL/min Final    Comment:    (NOTE) Calculated using the CKD-EPI Creatinine Equation (2021)           QUEtiapine  Fumarate  (SEROQUEL  XR) 150 MG 24 hr tablet [Pharmacy Med Name: QUETIAPINE  ER 150 MG TABLET] 90 tablet 1    Sig: TAKE 1 TABLET BY MOUTH AT BEDTIME.     Not Delegated - Psychiatry:  Antipsychotics - Second Generation (Atypical) - quetiapine  Failed - 06/08/2024 12:52 PM      Failed - This refill cannot be delegated      Failed - Lipid Panel in normal range within the last 12 months    Cholesterol, Total  Date Value Ref Range Status  04/12/2024 167 100 - 199 mg/dL Final   Cholesterol Piccolo, Waived  Date Value Ref Range Status  11/12/2016 173 <200 mg/dL Final    Comment:                            Desirable                <200                         Borderline High      200- 239                         High                     >239    LDL Chol Calc (NIH)  Date Value Ref Range Status  04/12/2024 90 0 - 99 mg/dL Final   HDL  Date Value Ref Range Status  04/12/2024 59 >39 mg/dL Final   Triglycerides  Date Value Ref Range Status  04/12/2024 102 0 - 149 mg/dL Final   Triglycerides Piccolo,Waived  Date Value Ref Range Status  11/12/2016 210 (H) <150 mg/dL Final    Comment:                            Normal                   <  150                         Borderline High     150 - 199                         High                200 - 499                         Very High                >499          Passed - TSH in normal range and within 360 days    TSH  Date Value Ref Range Status  04/12/2024 0.699 0.450 - 4.500 uIU/mL Final         Passed - Completed PHQ-2 or PHQ-9 in the last 360 days      Passed - Last BP in normal range    BP Readings from Last 1 Encounters:  04/12/24 113/74         Passed - Last Heart Rate in normal range    Pulse Readings from Last 1 Encounters:  04/12/24 93         Passed - Valid encounter within last 6 months    Recent Outpatient Visits           1 month ago Hyperlipidemia, unspecified hyperlipidemia type   [facility]  [place], Megan P, DO              Passed - CBC within normal limits and completed in the last 12 months    WBC  Date Value Ref Range Status  04/12/2024 6.8 3.4 - 10.8 x10E3/uL Final  07/18/2020 4.6 4.0 - 10.5 K/uL Final   RBC  Date Value Ref Range Status  04/12/2024 3.71 (L) 3.77 - 5.28 x10E6/uL Final  07/18/2020 3.40 (L) 3.87 - 5.11 MIL/uL Final   Hemoglobin  Date Value Ref Range Status  04/12/2024 12.7 11.1 - 15.9 g/dL Final   Hematocrit  Date Value Ref Range Status  04/12/2024 38.7 34.0 - 46.6 % Final   MCHC  Date Value Ref Range Status  04/12/2024 32.8 31.5 - 35.7 g/dL Final  89/78/7978 67.5 30.0 - 36.0 g/dL Final   [facility]  Date Value Ref Range Status  04/12/2024 34.2 (H) 26.6 - 33.0 pg Final  07/18/2020 32.9 26.0 - 34.0 pg Final   MCV  Date Value Ref Range Status  04/12/2024 104 (H) 79 - 97 fL Final   No results found for: PLTCOUNTKUC, LABPLAT, POCPLA RDW  Date Value Ref Range Status  04/12/2024 11.9 11.7 - 15.4 % Final         Passed - CMP within normal limits and completed in the last 12 months    Albumin   Date Value Ref Range Status  04/12/2024 4.2 3.8 - 4.8 g/dL Final   Alkaline Phosphatase  Date Value Ref Range Status  04/12/2024 97 44 - 121 IU/L Final   ALT  Date Value Ref Range Status  04/12/2024 9 0 - 32 IU/L Final   ALT (SGPT) Piccolo, Waived  Date Value Ref Range Status  11/12/2016 15 10 - 47 U/L Final   AST  Date Value Ref Range Status  04/12/2024 18 0 - 40 IU/L Final   AST (SGOT) Piccolo,  Waived  Date Value Ref Range Status  11/12/2016 19 11 - 38 U/L Final   BUN  Date Value Ref Range Status  04/12/2024 19 8 - 27 mg/dL Final   Calcium   Date Value Ref Range Status  04/12/2024 9.4 8.7 - 10.3 mg/dL Final   CO2  Date Value Ref Range Status  04/12/2024 22 20 - 29 mmol/L Final   Bicarbonate  Date Value Ref Range Status  04/07/2018 20.1 20.0 - 28.0 mmol/L Corrected   Creatinine, Ser  Date Value Ref Range Status   04/12/2024 1.04 (H) 0.57 - 1.00 mg/dL Final   Glucose  Date Value Ref Range Status  04/12/2024 87 70 - 99 mg/dL Final   Glucose, Bld  Date Value Ref Range Status  07/18/2020 81 70 - 99 mg/dL Final    Comment:    Glucose reference range applies only to samples taken after fasting for at least 8 hours.   Glucose-Capillary  Date Value Ref Range Status  04/14/2018 84 70 - 99 mg/dL Final   Potassium  Date Value Ref Range Status  04/12/2024 4.8 3.5 - 5.2 mmol/L Final   Sodium  Date Value Ref Range Status  04/12/2024 142 134 - 144 mmol/L Final   Bilirubin Total  Date Value Ref Range Status  04/12/2024 <0.2 0.0 - 1.2 mg/dL Final   Bilirubin, Direct  Date Value Ref Range Status  04/05/2018 0.2 0.0 - 0.2 mg/dL Final    Comment:    Please note change in reference range.   Indirect Bilirubin  Date Value Ref Range Status  04/05/2018 0.4 0.3 - 0.9 mg/dL Final    Comment:    Performed at [facility], [address] [place], [place] [address]   Protein, ur  Date Value Ref Range Status  04/05/2018 30 (A) NEGATIVE mg/dL Final   Protein,UA  Date Value Ref Range Status  07/30/2022 1+ (A) Negative/Trace Final   Total Protein  Date Value Ref Range Status  04/12/2024 6.8 6.0 - 8.5 g/dL Final   GFR calc Af Amer  Date Value Ref Range Status  11/24/2019 57 (L) >59 mL/min/1.73 Final   eGFR  Date Value Ref Range Status  04/12/2024 57 (L) >59 mL/min/1.73 Final   GFR, Estimated  Date Value Ref Range Status  07/18/2020 >60 >60 mL/min Final    Comment:    (NOTE) Calculated using the CKD-EPI Creatinine Equation (2021)

## 2024-06-12 ENCOUNTER — Other Ambulatory Visit: Payer: Self-pay | Admitting: Family Medicine

## 2024-06-12 NOTE — Telephone Encounter (Unsigned)
 Copied from CRM #[id number]. Topic: Clinical - Medication Refill >> Jun 12, 2024 10:18 AM Zebedee SAUNDERS wrote: Medication: QUEtiapine  Fumarate (SEROQUEL  XR) 200 MG 24 hr tablet  Has the patient contacted their pharmacy? Yes (Agent: If no, request that the patient contact the pharmacy for the refill. If patient does not wish to contact the pharmacy document the reason why and proceed with request.) (Agent: If yes, when and what did the pharmacy advise?)  This is the patient's preferred pharmacy:  [id number] - [place], [place] - [address] [place] [address] Phone: [phone] Fax: [phone]  Is this the correct pharmacy for this prescription? Yes If no, delete pharmacy and type the correct one.   Has the prescription been filled recently? Yes  Is the patient out of the medication? Yes  Has the patient been seen for an appointment in the last year OR does the patient have an upcoming appointment? Yes  Can we respond through MyChart? Yes  Agent: Please be advised that Rx refills may take up to 3 business days. We ask that you follow-up with your pharmacy.

## 2024-06-13 NOTE — Telephone Encounter (Signed)
 Requested medication (s) are due for refill today: {Yes  Requested medication (s) are on the active medication list: yes  Last refill:  04/12/24  Future visit scheduled: yes  Notes to clinic:  Unable to refill per protocol, cannot delegate.      Requested Prescriptions  Pending Prescriptions Disp Refills   QUEtiapine  (SEROQUEL  XR) 200 MG 24 hr tablet 90 tablet 0    Sig: Take 1 tablet (200 mg total) by mouth at bedtime.     Not Delegated - Psychiatry:  Antipsychotics - Second Generation (Atypical) - quetiapine  Failed - 06/13/2024  3:32 PM      Failed - This refill cannot be delegated      Failed - Lipid Panel in normal range within the last 12 months    Cholesterol, Total  Date Value Ref Range Status  04/12/2024 167 100 - 199 mg/dL Final   Cholesterol Piccolo, Waived  Date Value Ref Range Status  11/12/2016 173 <200 mg/dL Final    Comment:                            Desirable                <200                         Borderline High      200- 239                         High                     >239    LDL Chol Calc (NIH)  Date Value Ref Range Status  04/12/2024 90 0 - 99 mg/dL Final   HDL  Date Value Ref Range Status  04/12/2024 59 >39 mg/dL Final   Triglycerides  Date Value Ref Range Status  04/12/2024 102 0 - 149 mg/dL Final   Triglycerides Piccolo,Waived  Date Value Ref Range Status  11/12/2016 210 (H) <150 mg/dL Final    Comment:                            Normal                   <150                         Borderline High     150 - 199                         High                200 - 499                         Very High                >499          Passed - TSH in normal range and within 360 days    TSH  Date Value Ref Range Status  04/12/2024 0.699 0.450 - 4.500 uIU/mL Final         Passed - Completed PHQ-2 or PHQ-9 in the last 360 days      Passed - Last BP in  normal range    BP Readings from Last 1 Encounters:  04/12/24 113/74          Passed - Last Heart Rate in normal range    Pulse Readings from Last 1 Encounters:  04/12/24 93         Passed - Valid encounter within last 6 months    Recent Outpatient Visits           2 months ago Hyperlipidemia, unspecified hyperlipidemia type   [place] [facility] [place], Megan P, DO              Passed - CBC within normal limits and completed in the last 12 months    WBC  Date Value Ref Range Status  04/12/2024 6.8 3.4 - 10.8 x10E3/uL Final  07/18/2020 4.6 4.0 - 10.5 K/uL Final   RBC  Date Value Ref Range Status  04/12/2024 3.71 (L) 3.77 - 5.28 x10E6/uL Final  07/18/2020 3.40 (L) 3.87 - 5.11 MIL/uL Final   Hemoglobin  Date Value Ref Range Status  04/12/2024 12.7 11.1 - 15.9 g/dL Final   Hematocrit  Date Value Ref Range Status  04/12/2024 38.7 34.0 - 46.6 % Final   MCHC  Date Value Ref Range Status  04/12/2024 32.8 31.5 - 35.7 g/dL Final  89/78/7978 67.5 30.0 - 36.0 g/dL Final   [facility]  Date Value Ref Range Status  04/12/2024 34.2 (H) 26.6 - 33.0 pg Final  07/18/2020 32.9 26.0 - 34.0 pg Final   MCV  Date Value Ref Range Status  04/12/2024 104 (H) 79 - 97 fL Final   No results found for: PLTCOUNTKUC, LABPLAT, POCPLA RDW  Date Value Ref Range Status  04/12/2024 11.9 11.7 - 15.4 % Final         Passed - CMP within normal limits and completed in the last 12 months    Albumin   Date Value Ref Range Status  04/12/2024 4.2 3.8 - 4.8 g/dL Final   Alkaline Phosphatase  Date Value Ref Range Status  04/12/2024 97 44 - 121 IU/L Final   ALT  Date Value Ref Range Status  04/12/2024 9 0 - 32 IU/L Final   ALT (SGPT) Piccolo, Waived  Date Value Ref Range Status  11/12/2016 15 10 - 47 U/L Final   AST  Date Value Ref Range Status  04/12/2024 18 0 - 40 IU/L Final   AST (SGOT) Piccolo, Waived  Date Value Ref Range Status  11/12/2016 19 11 - 38 U/L Final   BUN  Date Value Ref Range Status  04/12/2024 19 8 - 27 mg/dL Final    Calcium   Date Value Ref Range Status  04/12/2024 9.4 8.7 - 10.3 mg/dL Final   CO2  Date Value Ref Range Status  04/12/2024 22 20 - 29 mmol/L Final   Bicarbonate  Date Value Ref Range Status  04/07/2018 20.1 20.0 - 28.0 mmol/L Corrected   Creatinine, Ser  Date Value Ref Range Status  04/12/2024 1.04 (H) 0.57 - 1.00 mg/dL Final   Glucose  Date Value Ref Range Status  04/12/2024 87 70 - 99 mg/dL Final   Glucose, Bld  Date Value Ref Range Status  07/18/2020 81 70 - 99 mg/dL Final    Comment:    Glucose reference range applies only to samples taken after fasting for at least 8 hours.   Glucose-Capillary  Date Value Ref Range Status  04/14/2018 84 70 - 99 mg/dL Final   Potassium  Date Value Ref Range  Status  04/12/2024 4.8 3.5 - 5.2 mmol/L Final   Sodium  Date Value Ref Range Status  04/12/2024 142 134 - 144 mmol/L Final   Bilirubin Total  Date Value Ref Range Status  04/12/2024 <0.2 0.0 - 1.2 mg/dL Final   Bilirubin, Direct  Date Value Ref Range Status  04/05/2018 0.2 0.0 - 0.2 mg/dL Final    Comment:    Please note change in reference range.   Indirect Bilirubin  Date Value Ref Range Status  04/05/2018 0.4 0.3 - 0.9 mg/dL Final    Comment:    Performed at [facility], [address] [place], [place] [address]   Protein, ur  Date Value Ref Range Status  04/05/2018 30 (A) NEGATIVE mg/dL Final   Protein,UA  Date Value Ref Range Status  07/30/2022 1+ (A) Negative/Trace Final   Total Protein  Date Value Ref Range Status  04/12/2024 6.8 6.0 - 8.5 g/dL Final   GFR calc Af Amer  Date Value Ref Range Status  11/24/2019 57 (L) >59 mL/min/1.73 Final   eGFR  Date Value Ref Range Status  04/12/2024 57 (L) >59 mL/min/1.73 Final   GFR, Estimated  Date Value Ref Range Status  07/18/2020 >60 >60 mL/min Final    Comment:    (NOTE) Calculated using the CKD-EPI Creatinine Equation (2021)

## 2024-06-19 ENCOUNTER — Other Ambulatory Visit: Payer: Self-pay | Admitting: Family Medicine

## 2024-06-19 NOTE — Telephone Encounter (Signed)
 Copied from CRM #[id number]. Topic: Clinical - Medication Refill >> Jun 19, 2024  4:38 PM Delon HERO wrote: Medication: traZODone  (DESYREL ) 100 MG tablet [[id number]]  Has the patient contacted their pharmacy? Yes (Agent: If no, request that the patient contact the pharmacy for the refill. If patient does not wish to contact the pharmacy document the reason why and proceed with request.) (Agent: If yes, when and what did the pharmacy advise?)  This is the patient's preferred pharmacy:  [id number] [place] [place] - [address] [place] [address] Phone: [phone] Fax: [phone]  Phone: [phone] Fax: [phone]  Is this the correct pharmacy for this prescription? Yes If no, delete pharmacy and type the correct one.   Has the prescription been filled recently? Yes  Is the patient out of the medication? Yes  Has the patient been seen for an appointment in the last year OR does the patient have an upcoming appointment? Yes  Can we respond through MyChart? Yes  Agent: Please be advised that Rx refills may take up to 3 business days. We ask that you follow-up with your pharmacy.

## 2024-06-20 NOTE — Telephone Encounter (Signed)
 Requested Prescriptions  Refused Prescriptions Disp Refills   traZODone  (DESYREL ) 100 MG tablet 135 tablet 1    Sig: TAKE 1 TO 1&1/2 TABLETS (100-150 MG TOTAL) BY MOUTH AT BEDTIME AS NEEDED FOR SLEEPStrength: 100 mg     Psychiatry: Antidepressants - Serotonin Modulator Passed - 06/20/2024  3:07 PM      Passed - Completed PHQ-2 or PHQ-9 in the last 360 days      Passed - Valid encounter within last 6 months    Recent Outpatient Visits           2 months ago Hyperlipidemia, unspecified hyperlipidemia type   [place] [facility] [place], Megan P, DO

## 2024-06-20 NOTE — Telephone Encounter (Signed)
 Called pharmacy. Pt has refill available. Unable to refill until 06/28/2024.

## 2024-06-23 ENCOUNTER — Other Ambulatory Visit: Payer: Self-pay | Admitting: Family Medicine

## 2024-06-23 NOTE — Telephone Encounter (Signed)
 Copied from CRM [id number]. Topic: Clinical - Medication Refill >> Jun 23, 2024 10:31 AM Donee H wrote: Medication:  traZODone  (DESYREL ) 100 MG tablet [[id number]]   QUEtiapine  Fumarate (SEROQUEL  XR) 200 MG 24 hr tablet   Has the patient contacted their pharmacy? Yes, pharmacy advised patient to reach out to provider   This is the patient's preferred pharmacy:  [id number] [place], [place] - [address] [place] [address] Phone: [phone] Fax: [phone]   Is this the correct pharmacy for this prescription? Yes If no, delete pharmacy and type the correct one.   Has the prescription been filled recently? No  Is the patient out of the medication? Yes, patient states she is completely out of both medications   Has the patient been seen for an appointment in the last year OR does the patient have an upcoming appointment? Yes  Can we respond through MyChart? Yes  Agent: Please be advised that Rx refills may take up to 3 business days. We ask that you follow-up with your pharmacy.

## 2024-06-26 NOTE — Telephone Encounter (Signed)
 Duplicate request, too soon for refill.  Requested Prescriptions  Pending Prescriptions Disp Refills   traZODone  (DESYREL ) 100 MG tablet 135 tablet 1    Sig: TAKE 1 TO 1&1/2 TABLETS (100-150 MG TOTAL) BY MOUTH AT BEDTIME AS NEEDED FOR SLEEPStrength: 100 mg     Psychiatry: Antidepressants - Serotonin Modulator Passed - 06/26/2024  9:41 AM      Passed - Completed PHQ-2 or PHQ-9 in the last 360 days      Passed - Valid encounter within last 6 months    Recent Outpatient Visits           2 months ago Hyperlipidemia, unspecified hyperlipidemia type   [facility] [place], Megan P, DO               QUEtiapine  (SEROQUEL  XR) 200 MG 24 hr tablet 90 tablet 0    Sig: Take 1 tablet (200 mg total) by mouth at bedtime.     Not Delegated - Psychiatry:  Antipsychotics - Second Generation (Atypical) - quetiapine  Failed - 06/26/2024  9:41 AM      Failed - This refill cannot be delegated      Failed - Lipid Panel in normal range within the last 12 months    Cholesterol, Total  Date Value Ref Range Status  04/12/2024 167 100 - 199 mg/dL Final   Cholesterol Piccolo, Waived  Date Value Ref Range Status  11/12/2016 173 <200 mg/dL Final    Comment:                            Desirable                <200                         Borderline High      200- 239                         High                     >239    LDL Chol Calc (NIH)  Date Value Ref Range Status  04/12/2024 90 0 - 99 mg/dL Final   HDL  Date Value Ref Range Status  04/12/2024 59 >39 mg/dL Final   Triglycerides  Date Value Ref Range Status  04/12/2024 102 0 - 149 mg/dL Final   Triglycerides Piccolo,Waived  Date Value Ref Range Status  11/12/2016 210 (H) <150 mg/dL Final    Comment:                            Normal                   <150                         Borderline High     150 - 199                         High                200 - 499                         Very High                 >  499          Passed - TSH in normal range and within 360 days    TSH  Date Value Ref Range Status  04/12/2024 0.699 0.450 - 4.500 uIU/mL Final         Passed - Completed PHQ-2 or PHQ-9 in the last 360 days      Passed - Last BP in normal range    BP Readings from Last 1 Encounters:  04/12/24 113/74         Passed - Last Heart Rate in normal range    Pulse Readings from Last 1 Encounters:  04/12/24 93         Passed - Valid encounter within last 6 months    Recent Outpatient Visits           2 months ago Hyperlipidemia, unspecified hyperlipidemia type   [facility] [place], Megan P, DO              Passed - CBC within normal limits and completed in the last 12 months    WBC  Date Value Ref Range Status  04/12/2024 6.8 3.4 - 10.8 x10E3/uL Final  07/18/2020 4.6 4.0 - 10.5 K/uL Final   RBC  Date Value Ref Range Status  04/12/2024 3.71 (L) 3.77 - 5.28 x10E6/uL Final  07/18/2020 3.40 (L) 3.87 - 5.11 MIL/uL Final   Hemoglobin  Date Value Ref Range Status  04/12/2024 12.7 11.1 - 15.9 g/dL Final   Hematocrit  Date Value Ref Range Status  04/12/2024 38.7 34.0 - 46.6 % Final   MCHC  Date Value Ref Range Status  04/12/2024 32.8 31.5 - 35.7 g/dL Final  89/78/7978 67.5 30.0 - 36.0 g/dL Final   [facility]  Date Value Ref Range Status  04/12/2024 34.2 (H) 26.6 - 33.0 pg Final  07/18/2020 32.9 26.0 - 34.0 pg Final   MCV  Date Value Ref Range Status  04/12/2024 104 (H) 79 - 97 fL Final   No results found for: PLTCOUNTKUC, LABPLAT, POCPLA RDW  Date Value Ref Range Status  04/12/2024 11.9 11.7 - 15.4 % Final         Passed - CMP within normal limits and completed in the last 12 months    Albumin   Date Value Ref Range Status  04/12/2024 4.2 3.8 - 4.8 g/dL Final   Alkaline Phosphatase  Date Value Ref Range Status  04/12/2024 97 44 - 121 IU/L Final   ALT  Date Value Ref Range Status  04/12/2024 9 0 - 32 IU/L Final   ALT (SGPT)  Piccolo, Waived  Date Value Ref Range Status  11/12/2016 15 10 - 47 U/L Final   AST  Date Value Ref Range Status  04/12/2024 18 0 - 40 IU/L Final   AST (SGOT) Piccolo, Waived  Date Value Ref Range Status  11/12/2016 19 11 - 38 U/L Final   BUN  Date Value Ref Range Status  04/12/2024 19 8 - 27 mg/dL Final   Calcium   Date Value Ref Range Status  04/12/2024 9.4 8.7 - 10.3 mg/dL Final   CO2  Date Value Ref Range Status  04/12/2024 22 20 - 29 mmol/L Final   Bicarbonate  Date Value Ref Range Status  04/07/2018 20.1 20.0 - 28.0 mmol/L Corrected   Creatinine, Ser  Date Value Ref Range Status  04/12/2024 1.04 (H) 0.57 - 1.00 mg/dL Final   Glucose  Date Value Ref Range Status  04/12/2024 87 70 - 99 mg/dL  Final   Glucose, Bld  Date Value Ref Range Status  07/18/2020 81 70 - 99 mg/dL Final    Comment:    Glucose reference range applies only to samples taken after fasting for at least 8 hours.   Glucose-Capillary  Date Value Ref Range Status  04/14/2018 84 70 - 99 mg/dL Final   Potassium  Date Value Ref Range Status  04/12/2024 4.8 3.5 - 5.2 mmol/L Final   Sodium  Date Value Ref Range Status  04/12/2024 142 134 - 144 mmol/L Final   Bilirubin Total  Date Value Ref Range Status  04/12/2024 <0.2 0.0 - 1.2 mg/dL Final   Bilirubin, Direct  Date Value Ref Range Status  04/05/2018 0.2 0.0 - 0.2 mg/dL Final    Comment:    Please note change in reference range.   Indirect Bilirubin  Date Value Ref Range Status  04/05/2018 0.4 0.3 - 0.9 mg/dL Final    Comment:    Performed at [facility], [address] [place], [place] [address]   Protein, ur  Date Value Ref Range Status  04/05/2018 30 (A) NEGATIVE mg/dL Final   Protein,UA  Date Value Ref Range Status  07/30/2022 1+ (A) Negative/Trace Final   Total Protein  Date Value Ref Range Status  04/12/2024 6.8 6.0 - 8.5 g/dL Final   GFR calc Af Amer  Date Value Ref Range Status  11/24/2019 57 (L) >59  mL/min/1.73 Final   eGFR  Date Value Ref Range Status  04/12/2024 57 (L) >59 mL/min/1.73 Final   GFR, Estimated  Date Value Ref Range Status  07/18/2020 >60 >60 mL/min Final    Comment:    (NOTE) Calculated using the CKD-EPI Creatinine Equation (2021)

## 2024-07-10 ENCOUNTER — Other Ambulatory Visit: Payer: Self-pay | Admitting: Family Medicine

## 2024-07-10 NOTE — Telephone Encounter (Signed)
 Over due for follow up

## 2024-07-10 NOTE — Telephone Encounter (Signed)
 Called patient and left a message for her to call back to get scheduled.

## 2024-07-10 NOTE — Telephone Encounter (Signed)
 RX t'd up for pharmacy requested.   Copied from CRM [id number]. Topic: Clinical - Prescription Issue >> Jul 10, 2024 10:10 AM Winona R wrote: Pharmacy has sent in a refill request today for QUEtiapine  Fumarate (SEROQUEL  XR) 200 MG 24 hr tablet. 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 Please send this refill to  [facility] [address] [place], [place] [address]  [facility] [phone] asap.

## 2024-07-11 NOTE — Telephone Encounter (Signed)
 Pt called back for status update

## 2024-07-11 NOTE — Telephone Encounter (Signed)
 Requested medication (s) are due for refill today: yes  Requested medication (s) are on the active medication list: yes  Last refill:  04/12/24  Future visit scheduled:   Notes to clinic:  Unable to refill per protocol, cannot delegate.      Requested Prescriptions  Pending Prescriptions Disp Refills   QUEtiapine  (SEROQUEL  XR) 200 MG 24 hr tablet [Pharmacy Med Name: QUETIAPINE  ER 200 MG TABLET] 90 tablet 0    Sig: TAKE 1 TABLET BY MOUTH AT BEDTIME.     Not Delegated - Psychiatry:  Antipsychotics - Second Generation (Atypical) - quetiapine  Failed - 07/11/2024  3:42 PM      Failed - This refill cannot be delegated      Failed - Lipid Panel in normal range within the last 12 months    Cholesterol, Total  Date Value Ref Range Status  04/12/2024 167 100 - 199 mg/dL Final   Cholesterol Piccolo, Waived  Date Value Ref Range Status  11/12/2016 173 <200 mg/dL Final    Comment:                            Desirable                <200                         Borderline High      200- 239                         High                     >239    LDL Chol Calc (NIH)  Date Value Ref Range Status  04/12/2024 90 0 - 99 mg/dL Final   HDL  Date Value Ref Range Status  04/12/2024 59 >39 mg/dL Final   Triglycerides  Date Value Ref Range Status  04/12/2024 102 0 - 149 mg/dL Final   Triglycerides Piccolo,Waived  Date Value Ref Range Status  11/12/2016 210 (H) <150 mg/dL Final    Comment:                            Normal                   <150                         Borderline High     150 - 199                         High                200 - 499                         Very High                >499          Passed - TSH in normal range and within 360 days    TSH  Date Value Ref Range Status  04/12/2024 0.699 0.450 - 4.500 uIU/mL Final         Passed - Completed PHQ-2 or PHQ-9 in the last 360 days  Passed - Last BP in normal range    BP Readings from Last 1  Encounters:  04/12/24 113/74         Passed - Last Heart Rate in normal range    Pulse Readings from Last 1 Encounters:  04/12/24 93         Passed - Valid encounter within last 6 months    Recent Outpatient Visits           3 months ago Hyperlipidemia, unspecified hyperlipidemia type   [place] [facility] [place], Megan P, DO              Passed - CBC within normal limits and completed in the last 12 months    WBC  Date Value Ref Range Status  04/12/2024 6.8 3.4 - 10.8 x10E3/uL Final  07/18/2020 4.6 4.0 - 10.5 K/uL Final   RBC  Date Value Ref Range Status  04/12/2024 3.71 (L) 3.77 - 5.28 x10E6/uL Final  07/18/2020 3.40 (L) 3.87 - 5.11 MIL/uL Final   Hemoglobin  Date Value Ref Range Status  04/12/2024 12.7 11.1 - 15.9 g/dL Final   Hematocrit  Date Value Ref Range Status  04/12/2024 38.7 34.0 - 46.6 % Final   MCHC  Date Value Ref Range Status  04/12/2024 32.8 31.5 - 35.7 g/dL Final  89/78/7978 67.5 30.0 - 36.0 g/dL Final   [facility]  Date Value Ref Range Status  04/12/2024 34.2 (H) 26.6 - 33.0 pg Final  07/18/2020 32.9 26.0 - 34.0 pg Final   MCV  Date Value Ref Range Status  04/12/2024 104 (H) 79 - 97 fL Final   No results found for: PLTCOUNTKUC, LABPLAT, POCPLA RDW  Date Value Ref Range Status  04/12/2024 11.9 11.7 - 15.4 % Final         Passed - CMP within normal limits and completed in the last 12 months    Albumin   Date Value Ref Range Status  04/12/2024 4.2 3.8 - 4.8 g/dL Final   Alkaline Phosphatase  Date Value Ref Range Status  04/12/2024 97 44 - 121 IU/L Final   ALT  Date Value Ref Range Status  04/12/2024 9 0 - 32 IU/L Final   ALT (SGPT) Piccolo, Waived  Date Value Ref Range Status  11/12/2016 15 10 - 47 U/L Final   AST  Date Value Ref Range Status  04/12/2024 18 0 - 40 IU/L Final   AST (SGOT) Piccolo, Waived  Date Value Ref Range Status  11/12/2016 19 11 - 38 U/L Final   BUN  Date Value Ref Range Status   04/12/2024 19 8 - 27 mg/dL Final   Calcium   Date Value Ref Range Status  04/12/2024 9.4 8.7 - 10.3 mg/dL Final   CO2  Date Value Ref Range Status  04/12/2024 22 20 - 29 mmol/L Final   Bicarbonate  Date Value Ref Range Status  04/07/2018 20.1 20.0 - 28.0 mmol/L Corrected   Creatinine, Ser  Date Value Ref Range Status  04/12/2024 1.04 (H) 0.57 - 1.00 mg/dL Final   Glucose  Date Value Ref Range Status  04/12/2024 87 70 - 99 mg/dL Final   Glucose, Bld  Date Value Ref Range Status  07/18/2020 81 70 - 99 mg/dL Final    Comment:    Glucose reference range applies only to samples taken after fasting for at least 8 hours.   Glucose-Capillary  Date Value Ref Range Status  04/14/2018 84 70 - 99 mg/dL Final   Potassium  Date Value Ref Range Status  04/12/2024 4.8 3.5 - 5.2 mmol/L Final   Sodium  Date Value Ref Range Status  04/12/2024 142 134 - 144 mmol/L Final   Bilirubin Total  Date Value Ref Range Status  04/12/2024 <0.2 0.0 - 1.2 mg/dL Final   Bilirubin, Direct  Date Value Ref Range Status  04/05/2018 0.2 0.0 - 0.2 mg/dL Final    Comment:    Please note change in reference range.   Indirect Bilirubin  Date Value Ref Range Status  04/05/2018 0.4 0.3 - 0.9 mg/dL Final    Comment:    Performed at [facility], [address] [place], [place] [address]   Protein, ur  Date Value Ref Range Status  04/05/2018 30 (A) NEGATIVE mg/dL Final   Protein,UA  Date Value Ref Range Status  07/30/2022 1+ (A) Negative/Trace Final   Total Protein  Date Value Ref Range Status  04/12/2024 6.8 6.0 - 8.5 g/dL Final   GFR calc Af Amer  Date Value Ref Range Status  11/24/2019 57 (L) >59 mL/min/1.73 Final   eGFR  Date Value Ref Range Status  04/12/2024 57 (L) >59 mL/min/1.73 Final   GFR, Estimated  Date Value Ref Range Status  07/18/2020 >60 >60 mL/min Final    Comment:    (NOTE) Calculated using the CKD-EPI Creatinine Equation (2021)

## 2024-07-13 ENCOUNTER — Other Ambulatory Visit: Payer: Self-pay | Admitting: Family Medicine

## 2024-07-13 MED ORDER — QUETIAPINE FUMARATE ER 200 MG PO TB24: 200.0000 mg | ORAL_TABLET | Freq: Every day | ORAL | 0 refills | Status: DC

## 2024-07-13 NOTE — Telephone Encounter (Signed)
 Prescription has been sent in for patient. Called and notified her that this has been done.    opied from CRM #[id number]. Topic: Clinical - Prescription Issue >> Jul 10, 2024 10:10 AM Winona R wrote: Pharmacy has sent in a refill request today for QUEtiapine  Fumarate (SEROQUEL  XR) 200 MG 24 hr tablet. 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 Please send this refill to  [facility] [address] [place], [place] [address]  [facility] [phone] asap. >> Jul 13, 2024 11:40 AM Delon DASEN wrote: Calling for update on refill for QUEtiapine  (SEROQUEL  XR) 200 MG 24 hr tablet  >> Jul 10, 2024  5:37 PM Winona R wrote: Pt is now scheduled and did not notice she missed an appointment

## 2024-07-21 ENCOUNTER — Encounter

## 2024-07-27 ENCOUNTER — Other Ambulatory Visit: Payer: Self-pay | Admitting: Family Medicine

## 2024-07-28 NOTE — Telephone Encounter (Signed)
 Too soon for refill.  Requested Prescriptions  Pending Prescriptions Disp Refills   hydrOXYzine  (VISTARIL ) 25 MG capsule [Pharmacy Med Name: HYDROXYZINE  PAM 25 MG CAP] 90 capsule 1    Sig: TAKE 1 CAPSULE (25 MG TOTAL) BY MOUTH EVERY 8 (EIGHT) HOURS AS NEEDED.     Ear, Nose, and Throat:  Antihistamines 2 Failed - 07/28/2024  1:58 PM      Failed - Cr in normal range and within 360 days    Creatinine, Ser  Date Value Ref Range Status  04/12/2024 1.04 (H) 0.57 - 1.00 mg/dL Final         Passed - Valid encounter within last 12 months    Recent Outpatient Visits           3 months ago Hyperlipidemia, unspecified hyperlipidemia type   [facility] [place], Megan P, DO

## 2024-08-04 ENCOUNTER — Other Ambulatory Visit: Payer: Self-pay | Admitting: Family Medicine

## 2024-08-05 NOTE — Telephone Encounter (Signed)
 Requested medication (s) are due for refill today: Yes  Requested medication (s) are on the active medication list: Yes  Last refill:  07/13/24  Future visit scheduled: Yes  Notes to clinic:  Unable to refill per protocol, cannot delegate.      Requested Prescriptions  Pending Prescriptions Disp Refills   QUEtiapine  (SEROQUEL  XR) 200 MG 24 hr tablet [Pharmacy Med Name: QUETIAPINE  ER 200 MG TABLET] 90 tablet 1    Sig: TAKE 1 TABLET BY MOUTH AT BEDTIME.     Not Delegated - Psychiatry:  Antipsychotics - Second Generation (Atypical) - quetiapine  Failed - 08/05/2024 10:47 AM      Failed - This refill cannot be delegated      Failed - Lipid Panel in normal range within the last 12 months    Cholesterol, Total  Date Value Ref Range Status  04/12/2024 167 100 - 199 mg/dL Final   Cholesterol Piccolo, Waived  Date Value Ref Range Status  11/12/2016 173 <200 mg/dL Final    Comment:                            Desirable                <200                         Borderline High      200- 239                         High                     >239    LDL Chol Calc (NIH)  Date Value Ref Range Status  04/12/2024 90 0 - 99 mg/dL Final   HDL  Date Value Ref Range Status  04/12/2024 59 >39 mg/dL Final   Triglycerides  Date Value Ref Range Status  04/12/2024 102 0 - 149 mg/dL Final   Triglycerides Piccolo,Waived  Date Value Ref Range Status  11/12/2016 210 (H) <150 mg/dL Final    Comment:                            Normal                   <150                         Borderline High     150 - 199                         High                200 - 499                         Very High                >499          Passed - TSH in normal range and within 360 days    TSH  Date Value Ref Range Status  04/12/2024 0.699 0.450 - 4.500 uIU/mL Final         Passed - Completed PHQ-2 or PHQ-9 in the last 360 days      Passed -  Last BP in normal range    BP Readings from Last 1  Encounters:  04/12/24 113/74         Passed - Last Heart Rate in normal range    Pulse Readings from Last 1 Encounters:  04/12/24 93         Passed - Valid encounter within last 6 months    Recent Outpatient Visits           3 months ago Hyperlipidemia, unspecified hyperlipidemia type   [facility] [place], Megan P, DO              Passed - CBC within normal limits and completed in the last 12 months    WBC  Date Value Ref Range Status  04/12/2024 6.8 3.4 - 10.8 x10E3/uL Final  07/18/2020 4.6 4.0 - 10.5 K/uL Final   RBC  Date Value Ref Range Status  04/12/2024 3.71 (L) 3.77 - 5.28 x10E6/uL Final  07/18/2020 3.40 (L) 3.87 - 5.11 MIL/uL Final   Hemoglobin  Date Value Ref Range Status  04/12/2024 12.7 11.1 - 15.9 g/dL Final   Hematocrit  Date Value Ref Range Status  04/12/2024 38.7 34.0 - 46.6 % Final   MCHC  Date Value Ref Range Status  04/12/2024 32.8 31.5 - 35.7 g/dL Final  89/78/7978 67.5 30.0 - 36.0 g/dL Final   [facility]  Date Value Ref Range Status  04/12/2024 34.2 (H) 26.6 - 33.0 pg Final  07/18/2020 32.9 26.0 - 34.0 pg Final   MCV  Date Value Ref Range Status  04/12/2024 104 (H) 79 - 97 fL Final   No results found for: PLTCOUNTKUC, LABPLAT, POCPLA RDW  Date Value Ref Range Status  04/12/2024 11.9 11.7 - 15.4 % Final         Passed - CMP within normal limits and completed in the last 12 months    Albumin   Date Value Ref Range Status  04/12/2024 4.2 3.8 - 4.8 g/dL Final   Alkaline Phosphatase  Date Value Ref Range Status  04/12/2024 97 44 - 121 IU/L Final   ALT  Date Value Ref Range Status  04/12/2024 9 0 - 32 IU/L Final   ALT (SGPT) Piccolo, Waived  Date Value Ref Range Status  11/12/2016 15 10 - 47 U/L Final   AST  Date Value Ref Range Status  04/12/2024 18 0 - 40 IU/L Final   AST (SGOT) Piccolo, Waived  Date Value Ref Range Status  11/12/2016 19 11 - 38 U/L Final   BUN  Date Value Ref Range Status   04/12/2024 19 8 - 27 mg/dL Final   Calcium   Date Value Ref Range Status  04/12/2024 9.4 8.7 - 10.3 mg/dL Final   CO2  Date Value Ref Range Status  04/12/2024 22 20 - 29 mmol/L Final   Bicarbonate  Date Value Ref Range Status  04/07/2018 20.1 20.0 - 28.0 mmol/L Corrected   Creatinine, Ser  Date Value Ref Range Status  04/12/2024 1.04 (H) 0.57 - 1.00 mg/dL Final   Glucose  Date Value Ref Range Status  04/12/2024 87 70 - 99 mg/dL Final   Glucose, Bld  Date Value Ref Range Status  07/18/2020 81 70 - 99 mg/dL Final    Comment:    Glucose reference range applies only to samples taken after fasting for at least 8 hours.   Glucose-Capillary  Date Value Ref Range Status  04/14/2018 84 70 - 99 mg/dL Final   Potassium  Date  Value Ref Range Status  04/12/2024 4.8 3.5 - 5.2 mmol/L Final   Sodium  Date Value Ref Range Status  04/12/2024 142 134 - 144 mmol/L Final   Bilirubin Total  Date Value Ref Range Status  04/12/2024 <0.2 0.0 - 1.2 mg/dL Final   Bilirubin, Direct  Date Value Ref Range Status  04/05/2018 0.2 0.0 - 0.2 mg/dL Final    Comment:    Please note change in reference range.   Indirect Bilirubin  Date Value Ref Range Status  04/05/2018 0.4 0.3 - 0.9 mg/dL Final    Comment:    Performed at [facility], [address] [place], [place] [address]   Protein, ur  Date Value Ref Range Status  04/05/2018 30 (A) NEGATIVE mg/dL Final   Protein,UA  Date Value Ref Range Status  07/30/2022 1+ (A) Negative/Trace Final   Total Protein  Date Value Ref Range Status  04/12/2024 6.8 6.0 - 8.5 g/dL Final   GFR calc Af Amer  Date Value Ref Range Status  11/24/2019 57 (L) >59 mL/min/1.73 Final   eGFR  Date Value Ref Range Status  04/12/2024 57 (L) >59 mL/min/1.73 Final   GFR, Estimated  Date Value Ref Range Status  07/18/2020 >60 >60 mL/min Final    Comment:    (NOTE) Calculated using the CKD-EPI Creatinine Equation (2021)

## 2024-08-08 ENCOUNTER — Ambulatory Visit: Admitting: Family Medicine

## 2024-08-08 ENCOUNTER — Encounter: Payer: Self-pay | Admitting: Family Medicine

## 2024-08-08 VITALS — BP 132/82 | HR 88 | Temp 98.0°F | Wt 103.2 lb

## 2024-08-08 DIAGNOSIS — Z1231 Encounter for screening mammogram for malignant neoplasm of breast: Secondary | ICD-10-CM

## 2024-08-08 DIAGNOSIS — F331 Major depressive disorder, recurrent, moderate: Secondary | ICD-10-CM | POA: Diagnosis not present

## 2024-08-08 DIAGNOSIS — R21 Rash and other nonspecific skin eruption: Secondary | ICD-10-CM

## 2024-08-08 MED ORDER — HYDROXYZINE PAMOATE 25 MG PO CAPS: 25.0000 mg | ORAL_CAPSULE | Freq: Three times a day (TID) | ORAL | 5 refills | Status: AC | PRN

## 2024-08-08 MED ORDER — QUETIAPINE FUMARATE ER 300 MG PO TB24: 300.0000 mg | ORAL_TABLET | Freq: Every day | ORAL | 2 refills | Status: DC

## 2024-08-08 NOTE — Assessment & Plan Note (Signed)
 Still not sleeping. Will increase her seroquel  to 300mg  and recheck in about a month. Call with any concerns.

## 2024-08-08 NOTE — Progress Notes (Signed)
 BP 132/82   Pulse 88   Temp 98 F (36.7 C) (Oral)   Wt 103 lb 4 oz (46.8 kg)   BMI 20.16 kg/m    Subjective:    Patient ID: Annette Miller, female    DOB: 07-Mar-1951, 73 y.o.   MRN: [id number]  HPI: Annette Miller is a 73 y.o. female  Chief Complaint  Patient presents with   Depression   Medication Refill   DEPRESSION Duration: chronic Mood status: better Satisfied with current treatment?: no Symptom severity: moderate  Duration of current treatment : chronic Side effects: no Medication compliance: good compliance Psychotherapy/counseling: no  Previous psychiatric medications: seroquel  Depressed mood: no Anxious mood: yes Anhedonia: no Significant weight loss or gain: no Insomnia: yes hard to fall asleep Fatigue: yes Feelings of worthlessness or guilt: yes Impaired concentration/indecisiveness: yes Suicidal ideations: no Hopelessness: no Crying spells: no    08/08/2024    9:12 AM 04/12/2024    9:46 AM 08/30/2023    1:42 PM 08/12/2023    9:12 AM 03/26/2023   10:45 AM  Depression screen PHQ 2/9  Decreased Interest 1 1 1  0 0  Down, Depressed, Hopeless 2 2 2  0 1  PHQ - 2 Score 3 3 3  0 1  Altered sleeping 3 3 3  0 3  Tired, decreased energy 2 3 3  0 0  Change in appetite 2 0 2 0 0  Feeling bad or failure about yourself  1 1 1  0 0  Trouble concentrating 2 1 0 0 0  Moving slowly or fidgety/restless 0 1 0 0 1  Suicidal thoughts 0 0 0 0 0  PHQ-9 Score 13 12  12   0  5   Difficult doing work/chores  Extremely dIfficult  Not difficult at all Not difficult at all     Data saved with a previous flowsheet row definition   Rash is no better. Still having a lot of issues.   Relevant past medical, surgical, family and social history reviewed and updated as indicated. Interim medical history since our last visit reviewed. Allergies and medications reviewed and updated.  Review of Systems  Constitutional: Negative.   Respiratory: Negative.    Cardiovascular:  Negative.   Musculoskeletal: Negative.   Psychiatric/Behavioral:  Positive for agitation and sleep disturbance. Negative for behavioral problems, confusion, decreased concentration, dysphoric mood, hallucinations, self-injury and suicidal ideas. The patient is nervous/anxious and is hyperactive.     Per HPI unless specifically indicated above     Objective:    BP 132/82   Pulse 88   Temp 98 F (36.7 C) (Oral)   Wt 103 lb 4 oz (46.8 kg)   BMI 20.16 kg/m   Wt Readings from Last 3 Encounters:  08/08/24 103 lb 4 oz (46.8 kg)  04/12/24 106 lb (48.1 kg)  10/21/23 109 lb (49.4 kg)    Physical Exam Vitals and nursing note reviewed.  Constitutional:      General: She is not in acute distress.    Appearance: Normal appearance. She is not ill-appearing, toxic-appearing or diaphoretic.  HENT:     Head: Normocephalic and atraumatic.     Right Ear: External ear normal.     Left Ear: External ear normal.     Nose: Nose normal.     Mouth/Throat:     Mouth: Mucous membranes are moist.     Pharynx: Oropharynx is clear.  Eyes:     General: No scleral icterus.       Right  eye: No discharge.        Left eye: No discharge.     Extraocular Movements: Extraocular movements intact.     Conjunctiva/sclera: Conjunctivae normal.     Pupils: Pupils are equal, round, and reactive to light.  Cardiovascular:     Rate and Rhythm: Normal rate and regular rhythm.     Pulses: Normal pulses.     Heart sounds: Normal heart sounds. No murmur heard.    No friction rub. No gallop.  Pulmonary:     Effort: Pulmonary effort is normal. No respiratory distress.     Breath sounds: Normal breath sounds. No stridor. No wheezing, rhonchi or rales.  Chest:     Chest wall: No tenderness.  Musculoskeletal:        General: Normal range of motion.     Cervical back: Normal range of motion and neck supple.  Skin:    General: Skin is warm and dry.     Capillary Refill: Capillary refill takes less than 2 seconds.      Coloration: Skin is not jaundiced or pale.     Findings: No bruising, erythema, lesion or rash.  Neurological:     General: No focal deficit present.     Mental Status: She is alert and oriented to person, place, and time. Mental status is at baseline.  Psychiatric:        Mood and Affect: Mood normal.        Behavior: Behavior normal.        Thought Content: Thought content normal.        Judgment: Judgment normal.     Results for orders placed or performed in visit on 04/12/24  CBC with Differential/Platelet   Collection Time: 04/12/24 10:20 AM  Result Value Ref Range   WBC 6.8 3.4 - 10.8 x10E3/uL   RBC 3.71 (L) 3.77 - 5.28 x10E6/uL   Hemoglobin 12.7 11.1 - 15.9 g/dL   Hematocrit 61.2 65.9 - 46.6 %   MCV 104 (H) 79 - 97 fL   MCH 34.2 (H) 26.6 - 33.0 pg   MCHC 32.8 31.5 - 35.7 g/dL   RDW 88.0 88.2 - 84.5 %   Platelets 324 150 - 450 x10E3/uL   Neutrophils 71 Not Estab. %   Lymphs 22 Not Estab. %   Monocytes 5 Not Estab. %   Eos 2 Not Estab. %   Basos 0 Not Estab. %   Neutrophils Absolute 4.9 1.4 - 7.0 x10E3/uL   Lymphocytes Absolute 1.5 0.7 - 3.1 x10E3/uL   Monocytes Absolute 0.3 0.1 - 0.9 x10E3/uL   EOS (ABSOLUTE) 0.1 0.0 - 0.4 x10E3/uL   Basophils Absolute 0.0 0.0 - 0.2 x10E3/uL   Immature Granulocytes 0 Not Estab. %   Immature Grans (Abs) 0.0 0.0 - 0.1 x10E3/uL  Lipid Panel w/o Chol/HDL Ratio   Collection Time: 04/12/24 10:20 AM  Result Value Ref Range   Cholesterol, Total 167 100 - 199 mg/dL   Triglycerides 897 0 - 149 mg/dL   HDL 59 >60 mg/dL   VLDL Cholesterol Cal 18 5 - 40 mg/dL   LDL Chol Calc (NIH) 90 0 - 99 mg/dL  Comprehensive metabolic panel with GFR   Collection Time: 04/12/24 10:20 AM  Result Value Ref Range   Glucose 87 70 - 99 mg/dL   BUN 19 8 - 27 mg/dL   Creatinine, Ser 8.95 (H) 0.57 - 1.00 mg/dL   eGFR 57 (L) >40 fO/fpw/8.26   BUN/Creatinine Ratio 18 12 - 28  Sodium 142 134 - 144 mmol/L   Potassium 4.8 3.5 - 5.2 mmol/L   Chloride 104 96 -  106 mmol/L   CO2 22 20 - 29 mmol/L   Calcium  9.4 8.7 - 10.3 mg/dL   Total Protein 6.8 6.0 - 8.5 g/dL   Albumin  4.2 3.8 - 4.8 g/dL   Globulin, Total 2.6 1.5 - 4.5 g/dL   Bilirubin Total <9.7 0.0 - 1.2 mg/dL   Alkaline Phosphatase 97 44 - 121 IU/L   AST 18 0 - 40 IU/L   ALT 9 0 - 32 IU/L  TSH   Collection Time: 04/12/24 10:20 AM  Result Value Ref Range   TSH 0.699 0.450 - 4.500 uIU/mL  B12   Collection Time: 04/12/24 10:20 AM  Result Value Ref Range   Vitamin B-12 1,019 232 - 1,245 pg/mL  VITAMIN D  25 Hydroxy (Vit-D Deficiency, Fractures)   Collection Time: 04/12/24 10:20 AM  Result Value Ref Range   Vit D, 25-Hydroxy 45.5 30.0 - 100.0 ng/mL      Assessment & Plan:   Problem List Items Addressed This Visit       Other   Depression - Primary   Still not sleeping. Will increase her seroquel  to 300mg  and recheck in about a month. Call with any concerns.       Relevant Medications   hydrOXYzine  (VISTARIL ) 25 MG capsule   Other Visit Diagnoses       Rash       Referral to dermatology placed today.   Relevant Orders   Ambulatory referral to Dermatology     Encounter for screening mammogram for malignant neoplasm of breast       Will come in 12/1 for mammo bus. Ordered today.   Relevant Orders   MM 3D SCREENING MAMMOGRAM BILATERAL BREAST        Follow up plan: Return in about 4 weeks (around 09/05/2024) for virtual OK, needs to be on a Monday.

## 2024-08-14 ENCOUNTER — Other Ambulatory Visit: Payer: Self-pay | Admitting: Family Medicine

## 2024-08-17 NOTE — Telephone Encounter (Signed)
 Requested medication (s) are due for refill today: Yes  Requested medication (s) are on the active medication list: Yes  Last refill:  12/12/23  Future visit scheduled: Yes  Notes to clinic:  Last filled by Diane Blair,NP    Requested Prescriptions  Pending Prescriptions Disp Refills   fluticasone  (FLONASE ) 50 MCG/ACT nasal spray [Pharmacy Med Name: FLUTICASONE  PROP 50 MCG SPRAY] 48 mL 2    Sig: SPRAY 2 SPRAYS INTO EACH NOSTRIL EVERY DAY     Ear, Nose, and Throat: Nasal Preparations - Corticosteroids Passed - 08/17/2024  1:25 PM      Passed - Valid encounter within last 12 months    Recent Outpatient Visits           1 week ago Moderate episode of recurrent major depressive disorder (HCC)   [place] [facility] [place], Megan P, DO   4 months ago Hyperlipidemia, unspecified hyperlipidemia type   [place] [facility] [place], Megan P, DO

## 2024-08-22 ENCOUNTER — Ambulatory Visit (INDEPENDENT_AMBULATORY_CARE_PROVIDER_SITE_OTHER): Payer: Self-pay | Admitting: Emergency Medicine

## 2024-08-22 VITALS — Ht 62.0 in | Wt 98.0 lb

## 2024-08-22 DIAGNOSIS — Z780 Asymptomatic menopausal state: Secondary | ICD-10-CM | POA: Diagnosis not present

## 2024-08-22 DIAGNOSIS — Z0000 Encounter for general adult medical examination without abnormal findings: Secondary | ICD-10-CM

## 2024-08-22 NOTE — Patient Instructions (Signed)
 Ms. Beilfuss,  Thank you for taking the time for your Medicare Wellness Visit. I appreciate your continued commitment to your health goals. Please review the care plan we discussed, and feel free to reach out if I can assist you further.  Please note that Annual Wellness Visits do not include a physical exam. Some assessments may be limited, especially if the visit was conducted virtually. If needed, we may recommend an in-person follow-up with your provider.  Ongoing Care Seeing your primary care provider every 3 to 6 months helps us  monitor your health and provide consistent, personalized care.   Referrals If a referral was made during today's visit and you haven't received any updates within two weeks, please contact the referred provider directly to check on the status.  Recommended Screenings:  Get a routine eye exam every 1-2 years. I have included a list of eye doctors in the area. Get your mammogram at [facility] on 08/28/24. Please call to schedule your bone density scan:  [facility] Address: [address], [place], [place] Phone: [phone]  [facility] [address] [place], [place] [address] Phone: [phone]   Health Maintenance  Topic Date Due   Breast Cancer Screening  Never done   Osteoporosis screening with Bone Density Scan  Never done   Medicare Annual Wellness Visit  08/11/2024   COVID-19 Vaccine (10 - 2025-26 season) 12/04/2024   Colon Cancer Screening  05/23/2029   DTaP/Tdap/Td vaccine (4 - Td or Tdap) 06/26/2033   Pneumococcal Vaccine for age over 24  Completed   Flu Shot  Completed   Hepatitis C Screening  Completed   Zoster (Shingles) Vaccine  Completed   Meningitis B Vaccine  Aged Out       08/22/2024    8:36 AM  Advanced Directives  Does Patient Have a Medical Advance Directive? Yes  Type of Estate Agent  of [place];Living will  Does patient want to make changes to medical advance directive? No - Patient declined  Copy of Healthcare Power of Attorney in Chart? Yes - validated most recent copy scanned in chart (See row information)    Vision: Annual vision screenings are recommended for early detection of glaucoma, cataracts, and diabetic retinopathy. These exams can also reveal signs of chronic conditions such as diabetes and high blood pressure.  Dental: Annual dental screenings help detect early signs of oral cancer, gum disease, and other conditions linked to overall health, including heart disease and diabetes.  Please see the attached documents for additional preventive care recommendations.   There are several Eye Doctors in your area. Here are a few that usually accept all insurance types:  [facility] [address] [place], [place] [address] Phone: [phone]  [facility] [address] [place], [place] [address] Phone: [phone]  [facility] [address] [place], [place] [address] Phone: [phone]  [facility] [address] [place], [place] [address] Phone: [phone]  [facility] [address] [place], [place] [address] Phone: [phone]  [facility] [address] [place], [place] [address] Phone: [phone]

## 2024-08-22 NOTE — Progress Notes (Signed)
 Chief Complaint  Patient presents with   Medicare Wellness     Subjective:   Annette Miller is a 73 y.o. female who presents for a Medicare Annual Wellness Visit.  Allergies (verified) Aspirin, Prednisone, Codeine, and Tape   History: Past Medical History:  Diagnosis Date   Allergy    Anemia    improved since surgery in May 2019   Anxiety    Arthritis    Depression    GERD (gastroesophageal reflux disease) 2019   PUD, history of diverticulitis   Goiter    H/O cardiac arrest 03/2018   s/p surgery and while in hospital. no cardiac history   Headache    History of blood transfusion 01/2018   Hyperlipidemia    Hypothyroidism    Osteoporosis    Pneumonia    Past Surgical History:  Procedure Laterality Date   ABDOMINAL HYSTERECTOMY  1979   APPENDECTOMY  01/2018   COLONOSCOPY WITH PROPOFOL  N/A 01/14/2018   Procedure: COLONOSCOPY WITH PROPOFOL ;  Surgeon: Unk Corinn Skiff, MD;  Location: [facility] ENDOSCOPY;  Service: Gastroenterology;  Laterality: N/A;   COLONOSCOPY WITH PROPOFOL  N/A 06/24/2018   Procedure: COLONOSCOPY WITH PROPOFOL ;  Surgeon: Therisa Bi, MD;  Location: [facility] ENDOSCOPY;  Service: Gastroenterology;  Laterality: N/A;   COLONOSCOPY WITH PROPOFOL  N/A 05/24/2019   Procedure: COLONOSCOPY WITH PROPOFOL ;  Surgeon: Therisa Bi, MD;  Location: [facility] ENDOSCOPY;  Service: Gastroenterology;  Laterality: N/A;   COLOSTOMY TAKEDOWN N/A 08/30/2018   Procedure: COLOSTOMY TAKEDOWN;  Surgeon: Jordis Laneta FALCON, MD;  Location: [facility] ORS;  Service: General;  Laterality: N/A;   DIAGNOSTIC LAPAROSCOPY     ESOPHAGOGASTRODUODENOSCOPY N/A 04/06/2018   Procedure: ESOPHAGOGASTRODUODENOSCOPY (EGD);  Surgeon: Unk Corinn Skiff, MD;  Location: [facility] ENDOSCOPY;  Service: Gastroenterology;  Laterality: N/A;   ESOPHAGOGASTRODUODENOSCOPY (EGD) WITH PROPOFOL  N/A 01/14/2018   Procedure: ESOPHAGOGASTRODUODENOSCOPY (EGD) WITH PROPOFOL ;  Surgeon: Unk Corinn Skiff, MD;  Location: [facility] ENDOSCOPY;  Service:  Gastroenterology;  Laterality: N/A;   ESOPHAGOGASTRODUODENOSCOPY (EGD) WITH PROPOFOL  N/A 06/24/2018   Procedure: ESOPHAGOGASTRODUODENOSCOPY (EGD) WITH PROPOFOL ;  Surgeon: Therisa Bi, MD;  Location: [facility] ENDOSCOPY;  Service: Gastroenterology;  Laterality: N/A;   FLEXIBLE SIGMOIDOSCOPY N/A 06/28/2019   Procedure: FLEXIBLE SIGMOIDOSCOPY with Dilation;  Surgeon: Therisa Bi, MD;  Location: [facility] ENDOSCOPY;  Service: Gastroenterology;  Laterality: N/A;   FLEXIBLE SIGMOIDOSCOPY N/A 07/20/2019   Procedure: FLEXIBLE SIGMOIDOSCOPY;  Surgeon: Therisa Bi, MD;  Location: [facility] ENDOSCOPY;  Service: Gastroenterology;  Laterality: N/A;   INCISION AND DRAINAGE ABSCESS Right 07/18/2020   Procedure: INCISION AND DRAINAGE ABSCESS, axillary abscess;  Surgeon: Jordis Laneta FALCON, MD;  Location: [facility] ORS;  Service: General;  Laterality: Right;   LAPAROTOMY N/A 02/25/2018   Procedure: EXPLORATORY LAPAROTOMY, PARTMAN'S PROCEDURE, APPENDECTOMY, COLOSTOMY;  Surgeon: Kaplin, Aviva W, DO;  Location: [facility] ORS;  Service: General;  Laterality: N/A;   TONSILLECTOMY  1976   VENTRAL HERNIA REPAIR N/A 08/30/2018   Procedure: HERNIA REPAIR VENTRAL ADULT;  Surgeon: Jordis Laneta FALCON, MD;  Location: [facility] ORS;  Service: General;  Laterality: N/A;   VISCERAL ANGIOGRAPHY N/A 04/07/2018   Procedure: VISCERAL ANGIOGRAPHY;  Surgeon: Marea Selinda GORMAN, MD;  Location: [facility] INVASIVE CV LAB;  Service: Cardiovascular;  Laterality: N/A;   Family History  Problem Relation Age of Onset   Cancer Mother    AAA (abdominal aortic aneurysm) Father    Cancer Maternal Grandmother    Stroke Maternal Grandmother    Cancer Paternal Grandfather    Social History   Occupational History    Comment:  retired   Occupation: travel engineer, civil (consulting)  Tobacco Use   Smoking status: Some Days    Current packs/day: 0.10    Average packs/day: 0.1 packs/day for 60.9 years (6.1 ttl pk-yrs)    Types: Cigarettes    Start date: 1965   Smokeless tobacco: Never   Tobacco comments:     smokes occasionally   Vaping Use   Vaping status: Never Used  Substance and Sexual Activity   Alcohol use: No   Drug use: Never   Sexual activity: Not Currently   Tobacco Counseling Ready to quit: Not Answered Counseling given: Not Answered Tobacco comments: smokes occasionally   SDOH Screenings   Food Insecurity: No Food Insecurity (08/22/2024)  Housing: Low Risk  (08/22/2024)  Transportation Needs: No Transportation Needs (08/22/2024)  Utilities: Not At Risk (08/22/2024)  Alcohol Screen: Low Risk  (08/12/2023)  Depression (PHQ2-9): Medium Risk (08/22/2024)  Financial Resource Strain: Low Risk  (04/12/2024)  Physical Activity: Insufficiently Active (08/22/2024)  Social Connections: Socially Isolated (08/22/2024)  Stress: No Stress Concern Present (08/22/2024)  Tobacco Use: High Risk (08/22/2024)  Health Literacy: Adequate Health Literacy (08/22/2024)   See flowsheets for full screening details  Depression Screen PHQ 2 & 9 Depression Scale- Over the past 2 weeks, how often have you been bothered by any of the following problems? Little interest or pleasure in doing things: 0 Feeling down, depressed, or hopeless (PHQ Adolescent also includes...irritable): 1 PHQ-2 Total Score: 1 Trouble falling or staying asleep, or sleeping too much: 3 Feeling tired or having little energy: 1 Poor appetite or overeating (PHQ Adolescent also includes...weight loss): 1 Feeling bad about yourself - or that you are a failure or have let yourself or your family down: 0 Trouble concentrating on things, such as reading the newspaper or watching television (PHQ Adolescent also includes...like school work): 0 Moving or speaking so slowly that other people could have noticed. Or the opposite - being so fidgety or restless that you have been moving around a lot more than usual: 0 Thoughts that you would be better off dead, or of hurting yourself in some way: 0 PHQ-9 Total Score: 6 If you checked off any  problems, how difficult have these problems made it for you to do your work, take care of things at home, or get along with other people?: Somewhat difficult  Depression Treatment Depression Interventions/Treatment : PHQ2-9 Score <4 Follow-up Not Indicated; Currently on Treatment     Goals Addressed               This Visit's Progress     exercise more and get better earing habits (pt-stated)         Visit info / Clinical Intake: Medicare Wellness Visit Type:: Subsequent Annual Wellness Visit Persons participating in visit:: patient Medicare Wellness Visit Mode:: Telephone If telephone:: video declined Because this visit was a virtual/telehealth visit:: pt reported vitals If Telephone or Video please confirm:: I connected with the patient using audio enabled telemedicine application and verified that I am speaking with the correct person using two identifiers; I discussed the limitations of evaluation and management by telemedicine; The patient expressed understanding and agreed to proceed Patient Location:: home Provider Location:: office/clinic Information given by:: patient Interpreter Needed?: No Pre-visit prep was completed: yes AWV questionnaire completed by patient prior to visit?: no Living arrangements:: with family/others Patient's Overall Health Status Rating: very good Typical amount of pain: some Does pain affect daily life?: (!) yes Are you currently prescribed opioids?: no  Dietary  Habits and Nutritional Risks How many meals a day?: 2 Eats fruit and vegetables daily?: yes Most meals are obtained by: preparing own meals In the last 2 weeks, have you had any of the following?: none Diabetic:: no  Functional Status Activities of Daily Living (to include ambulation/medication): Independent Ambulation: Independent with device- listed below Home Assistive Devices/Equipment: Eyeglasses (glasses for reading) Medication Administration: Independent Home  Management: Independent Manage your own finances?: yes Primary transportation is: driving Concerns about vision?: no *vision screening is required for WTM* Concerns about hearing?: no  Fall Screening Falls in the past year?: 1 Number of falls in past year: 1 Was there an injury with Fall?: 0 Fall Risk Category Calculator: 2 Patient Fall Risk Level: Moderate Fall Risk  Fall Risk Patient at Risk for Falls Due to: History of fall(s); Impaired balance/gait Fall risk Follow up: Falls evaluation completed; Education provided; Falls prevention discussed  Home and Transportation Safety: All rugs have non-skid backing?: N/A, no rugs All stairs or steps have railings?: yes Grab bars in the bathtub or shower?: (!) no Have non-skid surface in bathtub or shower?: yes Good home lighting?: yes Regular seat belt use?: yes Hospital stays in the last year:: no  Cognitive Assessment Difficulty concentrating, remembering, or making decisions? : no Will 6CIT or Mini Cog be Completed: yes What year is it?: 0 points What month is it?: 0 points Give patient an address phrase to remember (5 components): [address] [place] About what time is it?: 0 points Count backwards from 20 to 1: 0 points Say the months of the year in reverse: 0 points Repeat the address phrase from earlier: 0 points 6 CIT Score: 0 points  Advance Directives (For Healthcare) Does Patient Have a Medical Advance Directive?: Yes Does patient want to make changes to medical advance directive?: No - Patient declined Type of Advance Directive: Healthcare Power of [place]; Living will Copy of Healthcare Power of Attorney in Chart?: Yes - validated most recent copy scanned in chart (See row information) Copy of Living Will in Chart?: Yes - validated most recent copy scanned in chart (See row information)  Reviewed/Updated  Reviewed/Updated: Reviewed All (Medical, Surgical, Family, Medications, Allergies, Care Teams, Patient  Goals)        Objective:    Today's Vitals   08/22/24 0831  Weight: 98 lb (44.5 kg)  Height: 5' 2 (1.575 m)   Body mass index is 17.92 kg/m.  Current Medications (verified) Outpatient Encounter Medications as of 08/22/2024  Medication Sig   acetaminophen  (TYLENOL ) 325 MG tablet Take 650 mg by mouth every 6 (six) hours as needed for mild pain or fever.   albuterol  (VENTOLIN  HFA) 108 (90 Base) MCG/ACT inhaler TAKE 2 PUFFS BY MOUTH EVERY 6 HOURS AS NEEDED FOR WHEEZE OR SHORTNESS OF BREATH   Ascorbic Acid (VITAMIN C) 1000 MG tablet Take 3,000 mg by mouth daily.   Calcium  Carb-Cholecalciferol (OYSTER SHELL CALCIUM  PLUS D PO) Take 1 tablet by mouth daily.    cyclobenzaprine  (FLEXERIL ) 5 MG tablet TAKE 1 TABLET BY MOUTH EVERYDAY AT BEDTIME   Docusate Sodium  (COLACE PO) Take 1 Dose by mouth daily.   fexofenadine  (ALLEGRA ) 180 MG tablet Take 1 tablet (180 mg total) by mouth daily. (Patient taking differently: Take 180 mg by mouth daily. PRN)   fluticasone  (FLONASE ) 50 MCG/ACT nasal spray SPRAY 2 SPRAYS INTO EACH NOSTRIL EVERY DAY (Patient taking differently: PRN)   hydrOXYzine  (VISTARIL ) 25 MG capsule Take 1 capsule (25 mg total) by  mouth every 8 (eight) hours as needed.   levothyroxine  (LEVO-T ) 25 MCG tablet Take 1 tablet (25 mcg total) by mouth daily before breakfast.   magnesium  30 MG tablet Take 30 mg by mouth daily.    Multiple Vitamin (MULTIVITAMIN WITH MINERALS) TABS tablet Take 1 tablet by mouth daily.   niacin 250 MG CR capsule Take 250 mg by mouth at bedtime.   ondansetron  (ZOFRAN ) 4 MG tablet DISSOLVE 1 TABLET IN MOUTH AS NEEDED FOR NAUSEA UP TO 2 TIMES A DAY   pantoprazole  (PROTONIX ) 40 MG tablet Take 1 tablet (40 mg total) by mouth 2 (two) times daily.   promethazine  (PHENERGAN ) 25 MG suppository Place 1 suppository (25 mg total) rectally every 6 (six) hours as needed for nausea or vomiting.   QUEtiapine  (SEROQUEL  XR) 300 MG 24 hr tablet Take 1 tablet (300 mg total) by mouth at  bedtime.   rosuvastatin  (CRESTOR ) 10 MG tablet Take 1 tablet (10 mg total) by mouth daily.   Selenium  200 MCG CAPS Take 200 mcg by mouth daily.    traZODone  (DESYREL ) 100 MG tablet TAKE 1 TO 1&1/2 TABLETS (100-150 MG TOTAL) BY MOUTH AT BEDTIME AS NEEDED FOR SLEEPStrength: 100 mg   triamcinolone  cream (KENALOG ) 0.1 % Apply 1 application topically 2 (two) times daily.   vitamin B-12 (CYANOCOBALAMIN ) 250 MCG tablet Take 250 mcg by mouth daily.   VITAMIN D  PO Take by mouth. 1250 mcg   vitamin E  1000 UNIT capsule Take 1,000 Units by mouth 2 (two) times daily.   Zinc  Sulfate (ZINC  15 PO) Take 15 mg by mouth daily.    methocarbamol  (ROBAXIN -750) 750 MG tablet Take 1 tablet (750 mg total) by mouth every 8 (eight) hours as needed for muscle spasms. (Patient not taking: Reported on 08/22/2024)   No facility-administered encounter medications on file as of 08/22/2024.   Hearing/Vision screen Hearing Screening - Comments:: Denies hearing loss  Vision Screening - Comments:: Recommended routine eye exam every 1-2 years. Included list of eye doctors in AVS Immunizations and Health Maintenance Health Maintenance  Topic Date Due   Mammogram  Never done   Bone Density Scan  Never done   COVID-19 Vaccine (10 - 2025-26 season) 12/04/2024   Medicare Annual Wellness (AWV)  08/22/2025   Colonoscopy  05/23/2029   DTaP/Tdap/Td (4 - Td or Tdap) 06/26/2033   Pneumococcal Vaccine: 50+ Years  Completed   Influenza Vaccine  Completed   Hepatitis C Screening  Completed   Zoster Vaccines- Shingrix  Completed   Meningococcal B Vaccine  Aged Out        Assessment/Plan:  This is a routine wellness examination for Annette Miller.  Patient Care Team: Vicci Duwaine SQUIBB, DO as PCP - General (Family Medicine)  I have personally reviewed and noted the following in the patient's chart:   Medical and social history Use of alcohol, tobacco or illicit drugs  Current medications and supplements including opioid  prescriptions. Functional ability and status Nutritional status Physical activity Advanced directives List of other physicians Hospitalizations, surgeries, and ER visits in previous 12 months Vitals Screenings to include cognitive, depression, and falls Referrals and appointments  Orders Placed This Encounter  Procedures   DG Bone Density    Standing Status:   Future    Expected Date:   08/23/2024    Expiration Date:   08/22/2025    Reason for Exam (SYMPTOM  OR DIAGNOSIS REQUIRED):   postmenopausal estrogen deficiency    Preferred imaging location?:   [facility]  In addition, I have reviewed and discussed with patient certain preventive protocols, quality metrics, and best practice recommendations. A written personalized care plan for preventive services as well as general preventive health recommendations were provided to patient.   Annette Miller, CMA   08/22/2024   Return in 53 weeks (on 08/28/2025).  After Visit Summary: (MyChart) Due to this being a telephonic visit, the after visit summary with patients personalized plan was offered to patient via MyChart   Nurse Notes:  Placed order for DEXA Scan Patient to get [facility] 08/28/24 mobile unit Needs routine eye exam. Included list of eye doctors in AVS

## 2024-08-28 ENCOUNTER — Ambulatory Visit
Admission: RE | Admit: 2024-08-28 | Discharge: 2024-08-28 | Disposition: A | Source: Ambulatory Visit | Attending: Family Medicine

## 2024-08-28 ENCOUNTER — Ambulatory Visit: Admitting: Family Medicine

## 2024-08-28 DIAGNOSIS — Z1231 Encounter for screening mammogram for malignant neoplasm of breast: Secondary | ICD-10-CM

## 2024-08-29 ENCOUNTER — Other Ambulatory Visit: Payer: Self-pay | Admitting: Family Medicine

## 2024-08-30 ENCOUNTER — Other Ambulatory Visit: Payer: Self-pay | Admitting: Family Medicine

## 2024-08-31 NOTE — Telephone Encounter (Signed)
 Requested Prescriptions  Pending Prescriptions Disp Refills   albuterol  (VENTOLIN  HFA) 108 (90 Base) MCG/ACT inhaler [Pharmacy Med Name: ALBUTEROL  HFA (VENTOLIN ) INH] 36 each 2    Sig: TAKE 2 PUFFS BY MOUTH EVERY 6 HOURS AS NEEDED FOR WHEEZE OR SHORTNESS OF BREATH     Pulmonology:  Beta Agonists 2 Passed - 08/31/2024  3:42 PM      Passed - Last BP in normal range    BP Readings from Last 1 Encounters:  08/08/24 132/82         Passed - Last Heart Rate in normal range    Pulse Readings from Last 1 Encounters:  08/08/24 88         Passed - Valid encounter within last 12 months    Recent Outpatient Visits           3 weeks ago Moderate episode of recurrent major depressive disorder (HCC)   [place] [facility] [place], Megan P, DO   4 months ago Hyperlipidemia, unspecified hyperlipidemia type   [place] [facility] [place], Megan P, DO

## 2024-09-01 ENCOUNTER — Ambulatory Visit: Payer: Self-pay | Admitting: Family Medicine

## 2024-09-02 NOTE — Telephone Encounter (Signed)
 Requested medication (s) are due for refill today: yes  Requested medication (s) are on the active medication list: yes  Last refill:  various dates  Future visit scheduled: yes  Notes to clinic:  medication not delegated     Requested Prescriptions  Pending Prescriptions Disp Refills   QUEtiapine  (SEROQUEL  XR) 300 MG 24 hr tablet [Pharmacy Med Name: QUETIAPINE  ER 300 MG TABLET] 90 tablet 1    Sig: TAKE 1 TABLET BY MOUTH EVERYDAY AT BEDTIME     Not Delegated - Psychiatry:  Antipsychotics - Second Generation (Atypical) - quetiapine  Failed - 09/02/2024  8:30 AM      Failed - This refill cannot be delegated      Failed - Lipid Panel in normal range within the last 12 months    Cholesterol, Total  Date Value Ref Range Status  04/12/2024 167 100 - 199 mg/dL Final   Cholesterol Piccolo, Waived  Date Value Ref Range Status  11/12/2016 173 <200 mg/dL Final    Comment:                            Desirable                <200                         Borderline High      200- 239                         High                     >239    LDL Chol Calc (NIH)  Date Value Ref Range Status  04/12/2024 90 0 - 99 mg/dL Final   HDL  Date Value Ref Range Status  04/12/2024 59 >39 mg/dL Final   Triglycerides  Date Value Ref Range Status  04/12/2024 102 0 - 149 mg/dL Final   Triglycerides Piccolo,Waived  Date Value Ref Range Status  11/12/2016 210 (H) <150 mg/dL Final    Comment:                            Normal                   <150                         Borderline High     150 - 199                         High                200 - 499                         Very High                >499          Passed - TSH in normal range and within 360 days    TSH  Date Value Ref Range Status  04/12/2024 0.699 0.450 - 4.500 uIU/mL Final         Passed - Completed PHQ-2 or PHQ-9 in the last 360 days      Passed - Last  BP in normal range    BP Readings from Last 1 Encounters:   08/08/24 132/82         Passed - Last Heart Rate in normal range    Pulse Readings from Last 1 Encounters:  08/08/24 88         Passed - Valid encounter within last 6 months    Recent Outpatient Visits           3 weeks ago Moderate episode of recurrent major depressive disorder (HCC)   [place] [facility] [place], Megan P, DO   4 months ago Hyperlipidemia, unspecified hyperlipidemia type   [facility] [facility] [place], Megan P, DO              Passed - CBC within normal limits and completed in the last 12 months    WBC  Date Value Ref Range Status  04/12/2024 6.8 3.4 - 10.8 x10E3/uL Final  07/18/2020 4.6 4.0 - 10.5 K/uL Final   RBC  Date Value Ref Range Status  04/12/2024 3.71 (L) 3.77 - 5.28 x10E6/uL Final  07/18/2020 3.40 (L) 3.87 - 5.11 MIL/uL Final   Hemoglobin  Date Value Ref Range Status  04/12/2024 12.7 11.1 - 15.9 g/dL Final   Hematocrit  Date Value Ref Range Status  04/12/2024 38.7 34.0 - 46.6 % Final   MCHC  Date Value Ref Range Status  04/12/2024 32.8 31.5 - 35.7 g/dL Final  89/78/7978 67.5 30.0 - 36.0 g/dL Final   [facility]  Date Value Ref Range Status  04/12/2024 34.2 (H) 26.6 - 33.0 pg Final  07/18/2020 32.9 26.0 - 34.0 pg Final   MCV  Date Value Ref Range Status  04/12/2024 104 (H) 79 - 97 fL Final   No results found for: PLTCOUNTKUC, LABPLAT, POCPLA RDW  Date Value Ref Range Status  04/12/2024 11.9 11.7 - 15.4 % Final         Passed - CMP within normal limits and completed in the last 12 months    Albumin   Date Value Ref Range Status  04/12/2024 4.2 3.8 - 4.8 g/dL Final   Alkaline Phosphatase  Date Value Ref Range Status  04/12/2024 97 44 - 121 IU/L Final   ALT  Date Value Ref Range Status  04/12/2024 9 0 - 32 IU/L Final   ALT (SGPT) Piccolo, Waived  Date Value Ref Range Status  11/12/2016 15 10 - 47 U/L Final   AST  Date Value Ref Range Status  04/12/2024 18 0 - 40 IU/L Final   AST  (SGOT) Piccolo, Waived  Date Value Ref Range Status  11/12/2016 19 11 - 38 U/L Final   BUN  Date Value Ref Range Status  04/12/2024 19 8 - 27 mg/dL Final   Calcium   Date Value Ref Range Status  04/12/2024 9.4 8.7 - 10.3 mg/dL Final   CO2  Date Value Ref Range Status  04/12/2024 22 20 - 29 mmol/L Final   Bicarbonate  Date Value Ref Range Status  04/07/2018 20.1 20.0 - 28.0 mmol/L Corrected   Creatinine, Ser  Date Value Ref Range Status  04/12/2024 1.04 (H) 0.57 - 1.00 mg/dL Final   Glucose  Date Value Ref Range Status  04/12/2024 87 70 - 99 mg/dL Final   Glucose, Bld  Date Value Ref Range Status  07/18/2020 81 70 - 99 mg/dL Final    Comment:    Glucose reference range applies only to samples taken after fasting for at least 8  hours.   Glucose-Capillary  Date Value Ref Range Status  04/14/2018 84 70 - 99 mg/dL Final   Potassium  Date Value Ref Range Status  04/12/2024 4.8 3.5 - 5.2 mmol/L Final   Sodium  Date Value Ref Range Status  04/12/2024 142 134 - 144 mmol/L Final   Bilirubin Total  Date Value Ref Range Status  04/12/2024 <0.2 0.0 - 1.2 mg/dL Final   Bilirubin, Direct  Date Value Ref Range Status  04/05/2018 0.2 0.0 - 0.2 mg/dL Final    Comment:    Please note change in reference range.   Indirect Bilirubin  Date Value Ref Range Status  04/05/2018 0.4 0.3 - 0.9 mg/dL Final    Comment:    Performed at [facility], [address] [place], [place] [address]   Protein, ur  Date Value Ref Range Status  04/05/2018 30 (A) NEGATIVE mg/dL Final   Protein,UA  Date Value Ref Range Status  07/30/2022 1+ (A) Negative/Trace Final   Total Protein  Date Value Ref Range Status  04/12/2024 6.8 6.0 - 8.5 g/dL Final   GFR calc Af Amer  Date Value Ref Range Status  11/24/2019 57 (L) >59 mL/min/1.73 Final   eGFR  Date Value Ref Range Status  04/12/2024 57 (L) >59 mL/min/1.73 Final   GFR, Estimated  Date Value Ref Range Status  07/18/2020  >60 >60 mL/min Final    Comment:    (NOTE) Calculated using the CKD-EPI Creatinine Equation (2021)           hydrOXYzine  (VISTARIL ) 25 MG capsule [Pharmacy Med Name: HYDROXYZINE  PAM 25 MG CAP] 270 capsule 2    Sig: Take 1 capsule (25 mg total) by mouth every 8 (eight) hours as needed.     Ear, Nose, and Throat:  Antihistamines 2 Failed - 09/02/2024  8:30 AM      Failed - Cr in normal range and within 360 days    Creatinine, Ser  Date Value Ref Range Status  04/12/2024 1.04 (H) 0.57 - 1.00 mg/dL Final         Passed - Valid encounter within last 12 months    Recent Outpatient Visits           3 weeks ago Moderate episode of recurrent major depressive disorder [facility])   [place] [facility] [place], Megan P, DO   4 months ago Hyperlipidemia, unspecified hyperlipidemia type   [place] [facility] [place], Megan P, DO

## 2024-09-05 ENCOUNTER — Telehealth: Payer: Self-pay

## 2024-09-05 NOTE — Telephone Encounter (Signed)
 Copied from CRM #[id number]. Topic: General - Other >> Sep 05, 2024 11:12 AM Treva T wrote: Reason for CRM: Carlin RICKERS, pharmacist. Caller calling regarding medication therapy.  Hydroxyzine , Cyclobenzaprine  requesting a change in medications due to cognitive impairment and anticholinergic effects.  Can be reached for follow up call to [phone]

## 2024-09-05 NOTE — Telephone Encounter (Signed)
 Called and spoke with a pharmacist at [facility]. Notified them of Dr. Ferdie message.

## 2024-09-05 NOTE — Telephone Encounter (Signed)
 Routing to provider to advise on concerns.

## 2024-09-05 NOTE — Telephone Encounter (Signed)
 Unfortunately we cannot change medicines.

## 2024-09-23 ENCOUNTER — Other Ambulatory Visit: Payer: Self-pay | Admitting: Family Medicine

## 2024-10-03 ENCOUNTER — Ambulatory Visit: Admitting: Family Medicine

## 2024-10-25 ENCOUNTER — Other Ambulatory Visit: Payer: Self-pay | Admitting: Family Medicine

## 2024-10-25 DIAGNOSIS — M25572 Pain in left ankle and joints of left foot: Secondary | ICD-10-CM

## 2024-10-26 ENCOUNTER — Other Ambulatory Visit: Payer: Self-pay | Admitting: Family Medicine

## 2024-10-26 NOTE — Telephone Encounter (Signed)
 Copied from CRM #[id number]. Topic: Clinical - Medication Prior Auth >> Oct 26, 2024  8:34 AM Jasmin G wrote: Reason for CRM: [id number] - [place], [place] - [address]  [address], [place] [address] needs pre authorization to refill QUEtiapine  (SEROQUEL  XR) 300 MG 24 hr tablet, please send ASAP as pt states that she will be out of med today.

## 2024-10-26 NOTE — Telephone Encounter (Signed)
 Requested by interface surescripts. Future visit 11/10/24. Last labs 04/12/24.   Requested Prescriptions  Pending Prescriptions Disp Refills   pantoprazole  (PROTONIX ) 40 MG tablet [Pharmacy Med Name: PANTOPRAZOLE  SOD DR 40 MG TAB] 180 tablet 1    Sig: TAKE 1 TABLET BY MOUTH TWICE A DAY     Gastroenterology: Proton Pump Inhibitors Passed - 10/26/2024  8:02 AM      Passed - Valid encounter within last 12 months    Recent Outpatient Visits           2 months ago Moderate episode of recurrent major depressive disorder (HCC)   [place] [facility] [place], Megan P, DO   6 months ago Hyperlipidemia, unspecified hyperlipidemia type   [place] [facility], Megan P, DO               rosuvastatin  (CRESTOR ) 10 MG tablet [Pharmacy Med Name: ROSUVASTATIN  CALCIUM  10 MG TAB] 90 tablet 1    Sig: TAKE 1 TABLET BY MOUTH EVERY DAY     Cardiovascular:  Antilipid - Statins 2 Failed - 10/26/2024  8:02 AM      Failed - Cr in normal range and within 360 days    Creatinine, Ser  Date Value Ref Range Status  04/12/2024 1.04 (H) 0.57 - 1.00 mg/dL Final         Failed - Lipid Panel in normal range within the last 12 months    Cholesterol, Total  Date Value Ref Range Status  04/12/2024 167 100 - 199 mg/dL Final   Cholesterol Piccolo, Waived  Date Value Ref Range Status  11/12/2016 173 <200 mg/dL Final    Comment:                            Desirable                <200                         Borderline High      200- 239                         High                     >239    LDL Chol Calc (NIH)  Date Value Ref Range Status  04/12/2024 90 0 - 99 mg/dL Final   HDL  Date Value Ref Range Status  04/12/2024 59 >39 mg/dL Final   Triglycerides  Date Value Ref Range Status  04/12/2024 102 0 - 149 mg/dL Final   Triglycerides Piccolo,Waived  Date Value Ref Range Status  11/12/2016 210 (H) <150 mg/dL Final    Comment:                            Normal                    <150                         Borderline High     150 - 199                         High  200 - 499                         Very High                >499          Passed - Patient is not pregnant      Passed - Valid encounter within last 12 months    Recent Outpatient Visits           2 months ago Moderate episode of recurrent major depressive disorder (HCC)   [place] [facility] [place], Megan P, DO   6 months ago Hyperlipidemia, unspecified hyperlipidemia type   [place] [facility], Megan P, DO               QUEtiapine  (SEROQUEL  XR) 300 MG 24 hr tablet [Pharmacy Med Name: QUETIAPINE  ER 300 MG TABLET] 30 tablet 2    Sig: TAKE 1 TABLET BY MOUTH EVERYDAY AT BEDTIME     Not Delegated - Psychiatry:  Antipsychotics - Second Generation (Atypical) - quetiapine  Failed - 10/26/2024  8:02 AM      Failed - This refill cannot be delegated      Failed - Lipid Panel in normal range within the last 12 months    Cholesterol, Total  Date Value Ref Range Status  04/12/2024 167 100 - 199 mg/dL Final   Cholesterol Piccolo, Waived  Date Value Ref Range Status  11/12/2016 173 <200 mg/dL Final    Comment:                            Desirable                <200                         Borderline High      200- 239                         High                     >239    LDL Chol Calc (NIH)  Date Value Ref Range Status  04/12/2024 90 0 - 99 mg/dL Final   HDL  Date Value Ref Range Status  04/12/2024 59 >39 mg/dL Final   Triglycerides  Date Value Ref Range Status  04/12/2024 102 0 - 149 mg/dL Final   Triglycerides Piccolo,Waived  Date Value Ref Range Status  11/12/2016 210 (H) <150 mg/dL Final    Comment:                            Normal                   <150                         Borderline High     150 - 199                         High                200 - 499  Very High                 >499          Passed - TSH in normal range and within 360 days    TSH  Date Value Ref Range Status  04/12/2024 0.699 0.450 - 4.500 uIU/mL Final         Passed - Completed PHQ-2 or PHQ-9 in the last 360 days      Passed - Last BP in normal range    BP Readings from Last 1 Encounters:  08/08/24 132/82         Passed - Last Heart Rate in normal range    Pulse Readings from Last 1 Encounters:  08/08/24 88         Passed - Valid encounter within last 6 months    Recent Outpatient Visits           2 months ago Moderate episode of recurrent major depressive disorder (HCC)   [place] [facility] [place], Megan P, DO   6 months ago Hyperlipidemia, unspecified hyperlipidemia type   [place] [facility] [place], Megan P, DO              Passed - CBC within normal limits and completed in the last 12 months    WBC  Date Value Ref Range Status  04/12/2024 6.8 3.4 - 10.8 x10E3/uL Final  07/18/2020 4.6 4.0 - 10.5 K/uL Final   RBC  Date Value Ref Range Status  04/12/2024 3.71 (L) 3.77 - 5.28 x10E6/uL Final  07/18/2020 3.40 (L) 3.87 - 5.11 MIL/uL Final   Hemoglobin  Date Value Ref Range Status  04/12/2024 12.7 11.1 - 15.9 g/dL Final   Hematocrit  Date Value Ref Range Status  04/12/2024 38.7 34.0 - 46.6 % Final   MCHC  Date Value Ref Range Status  04/12/2024 32.8 31.5 - 35.7 g/dL Final  89/78/7978 67.5 30.0 - 36.0 g/dL Final   [facility]  Date Value Ref Range Status  04/12/2024 34.2 (H) 26.6 - 33.0 pg Final  07/18/2020 32.9 26.0 - 34.0 pg Final   MCV  Date Value Ref Range Status  04/12/2024 104 (H) 79 - 97 fL Final   No results found for: PLTCOUNTKUC, LABPLAT, POCPLA RDW  Date Value Ref Range Status  04/12/2024 11.9 11.7 - 15.4 % Final         Passed - CMP within normal limits and completed in the last 12 months    Albumin   Date Value Ref Range Status  04/12/2024 4.2 3.8 - 4.8 g/dL Final   Alkaline Phosphatase  Date  Value Ref Range Status  04/12/2024 97 44 - 121 IU/L Final   ALT  Date Value Ref Range Status  04/12/2024 9 0 - 32 IU/L Final   ALT (SGPT) Piccolo, Waived  Date Value Ref Range Status  11/12/2016 15 10 - 47 U/L Final   AST  Date Value Ref Range Status  04/12/2024 18 0 - 40 IU/L Final   AST (SGOT) Piccolo, Waived  Date Value Ref Range Status  11/12/2016 19 11 - 38 U/L Final   BUN  Date Value Ref Range Status  04/12/2024 19 8 - 27 mg/dL Final   Calcium   Date Value Ref Range Status  04/12/2024 9.4 8.7 - 10.3 mg/dL Final   CO2  Date Value Ref Range Status  04/12/2024 22 20 - 29 mmol/L Final   Bicarbonate  Date Value Ref Range Status  04/07/2018 20.1 20.0 - 28.0 mmol/L Corrected   Creatinine, Ser  Date Value Ref Range Status  04/12/2024 1.04 (H) 0.57 - 1.00 mg/dL Final   Glucose  Date Value Ref Range Status  04/12/2024 87 70 - 99 mg/dL Final   Glucose, Bld  Date Value Ref Range Status  07/18/2020 81 70 - 99 mg/dL Final    Comment:    Glucose reference range applies only to samples taken after fasting for at least 8 hours.   Glucose-Capillary  Date Value Ref Range Status  04/14/2018 84 70 - 99 mg/dL Final   Potassium  Date Value Ref Range Status  04/12/2024 4.8 3.5 - 5.2 mmol/L Final   Sodium  Date Value Ref Range Status  04/12/2024 142 134 - 144 mmol/L Final   Bilirubin Total  Date Value Ref Range Status  04/12/2024 <0.2 0.0 - 1.2 mg/dL Final   Bilirubin, Direct  Date Value Ref Range Status  04/05/2018 0.2 0.0 - 0.2 mg/dL Final    Comment:    Please note change in reference range.   Indirect Bilirubin  Date Value Ref Range Status  04/05/2018 0.4 0.3 - 0.9 mg/dL Final    Comment:    Performed at [facility], [address] [place], [place] [address]   Protein, ur  Date Value Ref Range Status  04/05/2018 30 (A) NEGATIVE mg/dL Final   Protein,UA  Date Value Ref Range Status  07/30/2022 1+ (A) Negative/Trace Final   Total Protein   Date Value Ref Range Status  04/12/2024 6.8 6.0 - 8.5 g/dL Final   GFR calc Af Amer  Date Value Ref Range Status  11/24/2019 57 (L) >59 mL/min/1.73 Final   eGFR  Date Value Ref Range Status  04/12/2024 57 (L) >59 mL/min/1.73 Final   GFR, Estimated  Date Value Ref Range Status  07/18/2020 >60 >60 mL/min Final    Comment:    (NOTE) Calculated using the CKD-EPI Creatinine Equation (2021)           cyclobenzaprine  (FLEXERIL ) 5 MG tablet [Pharmacy Med Name: CYCLOBENZAPRINE  5 MG TABLET] 30 tablet 6    Sig: TAKE 1 TABLET BY MOUTH EVERYDAY AT BEDTIME     Not Delegated - Analgesics:  Muscle Relaxants Failed - 10/26/2024  8:02 AM      Failed - This refill cannot be delegated      Passed - Valid encounter within last 6 months    Recent Outpatient Visits           2 months ago Moderate episode of recurrent major depressive disorder [facility])   [place] [facility] [place], Megan P, DO   6 months ago Hyperlipidemia, unspecified hyperlipidemia type   [place] [facility] [place], [place], DO

## 2024-10-26 NOTE — Telephone Encounter (Signed)
 Returned call to patient and advised that based on previous

## 2024-10-26 NOTE — Telephone Encounter (Signed)
 Requested medication (s) are due for refill today: na   Requested medication (s) are on the active medication list: yes   Last refill:  seroquel - 08/08/24 #30 2 refills, flexeril  04/12/24 #30 6 refills   Future visit scheduled: yes 11/10/24  Notes to clinic:  not delegated per protocol do you want to refill?     Requested Prescriptions  Pending Prescriptions Disp Refills   QUEtiapine  (SEROQUEL  XR) 300 MG 24 hr tablet [Pharmacy Med Name: QUETIAPINE  ER 300 MG TABLET] 30 tablet 2    Sig: TAKE 1 TABLET BY MOUTH EVERYDAY AT BEDTIME     Not Delegated - Psychiatry:  Antipsychotics - Second Generation (Atypical) - quetiapine  Failed - 10/26/2024  8:02 AM      Failed - This refill cannot be delegated      Failed - Lipid Panel in normal range within the last 12 months    Cholesterol, Total  Date Value Ref Range Status  04/12/2024 167 100 - 199 mg/dL Final   Cholesterol Piccolo, Waived  Date Value Ref Range Status  11/12/2016 173 <200 mg/dL Final    Comment:                            Desirable                <200                         Borderline High      200- 239                         High                     >239    LDL Chol Calc (NIH)  Date Value Ref Range Status  04/12/2024 90 0 - 99 mg/dL Final   HDL  Date Value Ref Range Status  04/12/2024 59 >39 mg/dL Final   Triglycerides  Date Value Ref Range Status  04/12/2024 102 0 - 149 mg/dL Final   Triglycerides Piccolo,Waived  Date Value Ref Range Status  11/12/2016 210 (H) <150 mg/dL Final    Comment:                            Normal                   <150                         Borderline High     150 - 199                         High                200 - 499                         Very High                >499          Passed - TSH in normal range and within 360 days    TSH  Date Value Ref Range Status  04/12/2024 0.699 0.450 - 4.500 uIU/mL Final         Passed -  Completed PHQ-2 or PHQ-9 in the last 360  days      Passed - Last BP in normal range    BP Readings from Last 1 Encounters:  08/08/24 132/82         Passed - Last Heart Rate in normal range    Pulse Readings from Last 1 Encounters:  08/08/24 88         Passed - Valid encounter within last 6 months    Recent Outpatient Visits           2 months ago Moderate episode of recurrent major depressive disorder (HCC)   [place] [facility] [place], Megan P, DO   6 months ago Hyperlipidemia, unspecified hyperlipidemia type   [place] [facility] [place], Megan P, DO              Passed - CBC within normal limits and completed in the last 12 months    WBC  Date Value Ref Range Status  04/12/2024 6.8 3.4 - 10.8 x10E3/uL Final  07/18/2020 4.6 4.0 - 10.5 K/uL Final   RBC  Date Value Ref Range Status  04/12/2024 3.71 (L) 3.77 - 5.28 x10E6/uL Final  07/18/2020 3.40 (L) 3.87 - 5.11 MIL/uL Final   Hemoglobin  Date Value Ref Range Status  04/12/2024 12.7 11.1 - 15.9 g/dL Final   Hematocrit  Date Value Ref Range Status  04/12/2024 38.7 34.0 - 46.6 % Final   MCHC  Date Value Ref Range Status  04/12/2024 32.8 31.5 - 35.7 g/dL Final  89/78/7978 67.5 30.0 - 36.0 g/dL Final   [facility]  Date Value Ref Range Status  04/12/2024 34.2 (H) 26.6 - 33.0 pg Final  07/18/2020 32.9 26.0 - 34.0 pg Final   MCV  Date Value Ref Range Status  04/12/2024 104 (H) 79 - 97 fL Final   No results found for: PLTCOUNTKUC, LABPLAT, POCPLA RDW  Date Value Ref Range Status  04/12/2024 11.9 11.7 - 15.4 % Final         Passed - CMP within normal limits and completed in the last 12 months    Albumin   Date Value Ref Range Status  04/12/2024 4.2 3.8 - 4.8 g/dL Final   Alkaline Phosphatase  Date Value Ref Range Status  04/12/2024 97 44 - 121 IU/L Final   ALT  Date Value Ref Range Status  04/12/2024 9 0 - 32 IU/L Final   ALT (SGPT) Piccolo, Waived  Date Value Ref Range Status  11/12/2016 15 10 - 47 U/L  Final   AST  Date Value Ref Range Status  04/12/2024 18 0 - 40 IU/L Final   AST (SGOT) Piccolo, Waived  Date Value Ref Range Status  11/12/2016 19 11 - 38 U/L Final   BUN  Date Value Ref Range Status  04/12/2024 19 8 - 27 mg/dL Final   Calcium   Date Value Ref Range Status  04/12/2024 9.4 8.7 - 10.3 mg/dL Final   CO2  Date Value Ref Range Status  04/12/2024 22 20 - 29 mmol/L Final   Bicarbonate  Date Value Ref Range Status  04/07/2018 20.1 20.0 - 28.0 mmol/L Corrected   Creatinine, Ser  Date Value Ref Range Status  04/12/2024 1.04 (H) 0.57 - 1.00 mg/dL Final   Glucose  Date Value Ref Range Status  04/12/2024 87 70 - 99 mg/dL Final   Glucose, Bld  Date Value Ref Range Status  07/18/2020 81 70 - 99 mg/dL Final    Comment:  Glucose reference range applies only to samples taken after fasting for at least 8 hours.   Glucose-Capillary  Date Value Ref Range Status  04/14/2018 84 70 - 99 mg/dL Final   Potassium  Date Value Ref Range Status  04/12/2024 4.8 3.5 - 5.2 mmol/L Final   Sodium  Date Value Ref Range Status  04/12/2024 142 134 - 144 mmol/L Final   Bilirubin Total  Date Value Ref Range Status  04/12/2024 <0.2 0.0 - 1.2 mg/dL Final   Bilirubin, Direct  Date Value Ref Range Status  04/05/2018 0.2 0.0 - 0.2 mg/dL Final    Comment:    Please note change in reference range.   Indirect Bilirubin  Date Value Ref Range Status  04/05/2018 0.4 0.3 - 0.9 mg/dL Final    Comment:    Performed at [facility], [address] [place], [place] [address]   Protein, ur  Date Value Ref Range Status  04/05/2018 30 (A) NEGATIVE mg/dL Final   Protein,UA  Date Value Ref Range Status  07/30/2022 1+ (A) Negative/Trace Final   Total Protein  Date Value Ref Range Status  04/12/2024 6.8 6.0 - 8.5 g/dL Final   GFR calc Af Amer  Date Value Ref Range Status  11/24/2019 57 (L) >59 mL/min/1.73 Final   eGFR  Date Value Ref Range Status  04/12/2024 57 (L)  >59 mL/min/1.73 Final   GFR, Estimated  Date Value Ref Range Status  07/18/2020 >60 >60 mL/min Final    Comment:    (NOTE) Calculated using the CKD-EPI Creatinine Equation (2021)           cyclobenzaprine  (FLEXERIL ) 5 MG tablet [Pharmacy Med Name: CYCLOBENZAPRINE  5 MG TABLET] 30 tablet 6    Sig: TAKE 1 TABLET BY MOUTH EVERYDAY AT BEDTIME     Not Delegated - Analgesics:  Muscle Relaxants Failed - 10/26/2024  8:02 AM      Failed - This refill cannot be delegated      Passed - Valid encounter within last 6 months    Recent Outpatient Visits           2 months ago Moderate episode of recurrent major depressive disorder (HCC)   [place] [facility] [place], Megan P, DO   6 months ago Hyperlipidemia, unspecified hyperlipidemia type   [place] [facility] [place], Megan P, DO              Signed Prescriptions Disp Refills   pantoprazole  (PROTONIX ) 40 MG tablet 180 tablet 1    Sig: TAKE 1 TABLET BY MOUTH TWICE A DAY     Gastroenterology: Proton Pump Inhibitors Passed - 10/26/2024  8:02 AM      Passed - Valid encounter within last 12 months    Recent Outpatient Visits           2 months ago Moderate episode of recurrent major depressive disorder (HCC)   [facility] [place], Megan P, DO   6 months ago Hyperlipidemia, unspecified hyperlipidemia type   [place] [facility], Megan P, DO               rosuvastatin  (CRESTOR ) 10 MG tablet 90 tablet 1    Sig: TAKE 1 TABLET BY MOUTH EVERY DAY     Cardiovascular:  Antilipid - Statins 2 Failed - 10/26/2024  8:02 AM      Failed - Cr in normal range and within 360 days    Creatinine, Ser  Date Value Ref Range Status  04/12/2024 1.04 (H) 0.57 - 1.00 mg/dL Final         Failed - Lipid Panel in normal range within the last 12 months    Cholesterol, Total  Date Value Ref Range Status  04/12/2024 167 100 - 199 mg/dL Final   Cholesterol Piccolo,  Waived  Date Value Ref Range Status  11/12/2016 173 <200 mg/dL Final    Comment:                            Desirable                <200                         Borderline High      200- 239                         High                     >239    LDL Chol Calc (NIH)  Date Value Ref Range Status  04/12/2024 90 0 - 99 mg/dL Final   HDL  Date Value Ref Range Status  04/12/2024 59 >39 mg/dL Final   Triglycerides  Date Value Ref Range Status  04/12/2024 102 0 - 149 mg/dL Final   Triglycerides Piccolo,Waived  Date Value Ref Range Status  11/12/2016 210 (H) <150 mg/dL Final    Comment:                            Normal                   <150                         Borderline High     150 - 199                         High                200 - 499                         Very High                >499          Passed - Patient is not pregnant      Passed - Valid encounter within last 12 months    Recent Outpatient Visits           2 months ago Moderate episode of recurrent major depressive disorder (HCC)   [place] [facility] [place], Megan P, DO   6 months ago Hyperlipidemia, unspecified hyperlipidemia type   [place] [facility] [place], Megan P, DO

## 2024-10-27 MED ORDER — QUETIAPINE FUMARATE ER 300 MG PO TB24: 300.0000 mg | ORAL_TABLET | Freq: Every day | ORAL | 1 refills | Status: AC

## 2024-10-27 NOTE — Addendum Note (Signed)
 Addended by: Kiamesha Samet T on: 10/27/2024 09:37 AM   Modules accepted: Orders

## 2024-11-03 ENCOUNTER — Ambulatory Visit: Payer: Self-pay

## 2024-11-03 NOTE — Telephone Encounter (Signed)
 Patient does not want an appt. She is in [place] 

## 2024-11-03 NOTE — Telephone Encounter (Signed)
 Unfortunately I would advise her to see an urgent care down in [facility]

## 2024-11-03 NOTE — Telephone Encounter (Signed)
 FYI Only or Action Required?: Action required by provider: clinical question for provider and update on patient condition.  Patient was last seen in primary care on 08/08/2024 by Vicci Duwaine SQUIBB, DO.  Called Nurse Triage reporting Cough.  Symptoms began several days ago.  Interventions attempted: OTC medications: dayquil; nightquil; mucinex.  Symptoms are: gradually worsening.  Triage Disposition: See Physician Within 24 Hours  Patient/caregiver understands and will follow disposition?: No, wishes to speak with PCP    Message from Tiffini S sent at 11/03/2024  9:34 AM EST  Reason for Triage: have a sinus infection or bronchitis or both- coughing, sneezy with yellow mucus, fever, pressure under eyes, headaches and chest pain- started two days ago      Reason for Disposition  [1] Using nasal washes and pain medicine > 24 hours AND [2] sinus pain (around cheekbone or eye) persists  Answer Assessment - Initial Assessment Questions Pt called in requesting rx for possible sinus infection vs. Bronchitis. Pt reports sneezing, coughing, low grade fever (99.58F) and yellow/green mucous. Pt is using dayquil, nightquil, mucinex, fluticasone  nasal spray and saline washes without much relief. 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 Pt is currently in [place], [place]; [facility] added to pharm list and placed at number one. Discussed that rx would be provider preference as she cannot be seen d/t location. Pt will be out of state until next Tuesday. She is available via cell, okay to LM. Please advise.      1. ONSET: When did the cough begin?      X 3 days   2. SEVERITY: How bad is the cough today?      Moderate; chest pain when coughing but dissipates post cough   3. SPUTUM: Describe the color of your sputum (e.g., none, dry cough; clear, white, yellow, green)     Nasal yellow/green, coughing up yellow  mucous   4. HEMOPTYSIS: Are you coughing up any blood? If Yes, ask: How much? (e.g., flecks, streaks, tablespoons, etc.)     No   5. DIFFICULTY BREATHING: Are you having difficulty breathing? If Yes, ask: How bad is it? (e.g., mild, moderate, severe)      None   6. FEVER: Do you have a fever? If Yes, ask: What is your temperature, how was it measured, and when did it start?     Low grade, 99.58F tympanic   12. TRAVEL: Have you traveled out of the country in the last month? (e.g., travel history, exposures)       Pt is a travel engineer, civil (consulting) and currently on assignment in [place]  Protocols used: Cough - Acute Productive-A-AH

## 2024-11-03 NOTE — Telephone Encounter (Signed)
 appt

## 2024-11-10 ENCOUNTER — Ambulatory Visit: Admitting: Family Medicine

## 2024-12-11 ENCOUNTER — Ambulatory Visit: Admitting: Family Medicine

## 2025-08-28 ENCOUNTER — Ambulatory Visit
